# Patient Record
Sex: Male | Born: 1945
Health system: Southern US, Community
[De-identification: ages and names within clinical notes are randomized; demographics above are authoritative.]

## PROBLEM LIST (undated history)

## (undated) DIAGNOSIS — I499 Cardiac arrhythmia, unspecified: Secondary | ICD-10-CM

## (undated) DIAGNOSIS — J310 Chronic rhinitis: Secondary | ICD-10-CM

## (undated) DIAGNOSIS — R7303 Prediabetes: Secondary | ICD-10-CM

## (undated) DIAGNOSIS — E785 Hyperlipidemia, unspecified: Secondary | ICD-10-CM

## (undated) DIAGNOSIS — N400 Enlarged prostate without lower urinary tract symptoms: Secondary | ICD-10-CM

## (undated) DIAGNOSIS — I509 Heart failure, unspecified: Secondary | ICD-10-CM

## (undated) DIAGNOSIS — E669 Obesity, unspecified: Secondary | ICD-10-CM

## (undated) DIAGNOSIS — Q2543 Congenital aneurysm of aorta: Secondary | ICD-10-CM

## (undated) DIAGNOSIS — I4891 Unspecified atrial fibrillation: Secondary | ICD-10-CM

## (undated) DIAGNOSIS — I2699 Other pulmonary embolism without acute cor pulmonale: Secondary | ICD-10-CM

## (undated) DIAGNOSIS — I351 Nonrheumatic aortic (valve) insufficiency: Secondary | ICD-10-CM

## (undated) DIAGNOSIS — R011 Cardiac murmur, unspecified: Secondary | ICD-10-CM

## (undated) DIAGNOSIS — I719 Aortic aneurysm of unspecified site, without rupture: Secondary | ICD-10-CM

## (undated) DIAGNOSIS — I7121 Aneurysm of the ascending aorta, without rupture: Secondary | ICD-10-CM

## (undated) DIAGNOSIS — G4730 Sleep apnea, unspecified: Secondary | ICD-10-CM

## (undated) DIAGNOSIS — I10 Essential (primary) hypertension: Secondary | ICD-10-CM

## (undated) HISTORY — DX: Congenital aneurysm of aorta: Q25.43

## (undated) HISTORY — DX: Nonrheumatic aortic (valve) insufficiency: I35.1

## (undated) HISTORY — DX: Aneurysm of the ascending aorta, without rupture: I71.21

## (undated) HISTORY — DX: Obesity, unspecified: E66.9

## (undated) HISTORY — PX: APPENDECTOMY: SHX54

## (undated) HISTORY — DX: Essential (primary) hypertension: I10

## (undated) HISTORY — DX: Other pulmonary embolism without acute cor pulmonale: I26.99

## (undated) HISTORY — DX: Sleep apnea, unspecified: G47.30

## (undated) HISTORY — DX: Aortic aneurysm of unspecified site, without rupture: I71.9

## (undated) HISTORY — DX: Prediabetes: R73.03

## (undated) HISTORY — DX: Hyperlipidemia, unspecified: E78.5

## (undated) HISTORY — DX: Benign prostatic hyperplasia without lower urinary tract symptoms: N40.0

## (undated) HISTORY — DX: Unspecified atrial fibrillation: I48.91

## (undated) HISTORY — PX: HERNIA REPAIR: SHX51

## (undated) HISTORY — DX: Chronic rhinitis: J31.0

## (undated) HISTORY — PX: TONSILLECTOMY: SUR1361

---

## 2012-07-22 ENCOUNTER — Encounter: Payer: Self-pay | Admitting: Cardiology

## 2012-08-17 ENCOUNTER — Other Ambulatory Visit: Payer: Self-pay | Admitting: *Deleted

## 2012-08-18 ENCOUNTER — Encounter: Payer: Self-pay | Admitting: Cardiothoracic Surgery

## 2012-09-01 ENCOUNTER — Institutional Professional Consult (permissible substitution): Payer: Self-pay | Admitting: Emergency Medicine

## 2012-09-23 ENCOUNTER — Ambulatory Visit (INDEPENDENT_AMBULATORY_CARE_PROVIDER_SITE_OTHER): Payer: 59 | Admitting: Emergency Medicine

## 2012-09-23 ENCOUNTER — Encounter: Payer: Self-pay | Admitting: Emergency Medicine

## 2012-09-23 VITALS — BP 132/84 | HR 102 | Temp 98.4°F | Ht 69.5 in | Wt 224.0 lb

## 2012-09-23 DIAGNOSIS — G4733 Obstructive sleep apnea (adult) (pediatric): Secondary | ICD-10-CM

## 2012-09-23 DIAGNOSIS — Z86711 Personal history of pulmonary embolism: Secondary | ICD-10-CM

## 2012-09-23 DIAGNOSIS — I4891 Unspecified atrial fibrillation: Secondary | ICD-10-CM

## 2012-09-23 DIAGNOSIS — I719 Aortic aneurysm of unspecified site, without rupture: Secondary | ICD-10-CM

## 2012-09-23 DIAGNOSIS — Q2543 Congenital aneurysm of aorta: Secondary | ICD-10-CM

## 2012-09-23 NOTE — Progress Notes (Signed)
Subjective:    Patient ID: Aaron Richmond, male    DOB: 01-May-1945, 67 y.o.   MRN: [id number]  HPI 67 yo man, hx OSA on CPAP for 6 years, also w hx A Fib, HTN. Reports that he had PE twice, both time after plane rides. Treated with anticoag but cannot recall how long or whether hypercoag state was ever evaluated. He needs CPAP retitration, has never done this. He does get equipment from [facility] (owns his machine)   Review of Systems  Constitutional: Negative for fever and unexpected weight change.  HENT: Negative for ear pain, nosebleeds, congestion, sore throat, rhinorrhea, sneezing, trouble swallowing, dental problem, postnasal drip and sinus pressure.   Eyes: Negative for redness and itching.  Respiratory: Negative for cough, chest tightness, shortness of breath and wheezing.   Cardiovascular: Negative for palpitations and leg swelling.  Gastrointestinal: Negative for nausea and vomiting.  Genitourinary: Negative for dysuria.  Musculoskeletal: Negative for joint swelling.  Skin: Negative for rash.  Neurological: Negative for headaches.  Hematological: Does not bruise/bleed easily.  Psychiatric/Behavioral: Negative for dysphoric mood. The patient is not nervous/anxious.     Past Medical History  Diagnosis Date  . Hypertension   . Sleep apnea     CPAP  . BPH (benign prostatic hyperplasia)   . Pulmonary embolism   . Atrial fibrillation   . Hyperlipidemia   . Obesity   . Rhinitis   . Aortic regurgitation   . Aortic root aneurysm      Family History  Problem Relation Age of Onset  . Stroke Mother   . Stroke Father   . Alzheimer's disease Mother   . Heart attack Mother   . Diabetes Mellitus II Father      History   Social History  . Marital Status: Married    Spouse Name: N/A    Number of Children: 0  . Years of Education: N/A   Occupational History  . Chief Operating Officer    Social History Main Topics  . Smoking status: Never Smoker   . Smokeless  tobacco: Never Used  . Alcohol Use: No  . Drug Use: No  . Sexual Activity: Not on file   Other Topics Concern  . Not on file   Social History Narrative  . No narrative on file     Allergies  Allergen Reactions  . Statins Other (See Comments)    MYALGIAS     No outpatient prescriptions prior to visit.   No facility-administered medications prior to visit.         Objective:   Physical Exam Filed Vitals:   09/23/12 1527  BP: 132/84  Pulse: 102  Temp: 98.4 F (36.9 C)  TempSrc: Oral  Height: 5' 9.5" (1.765 m)  Weight: 224 lb (101.606 kg)  SpO2: 93%   Gen: Pleasant, well-nourished, in no distress,  normal affect  ENT: No lesions,  mouth clear,  oropharynx clear, no postnasal drip  Neck: No JVD, no TMG, no carotid bruits  Lungs: No use of accessory muscles, no dullness to percussion, clear without rales or rhonchi  Cardiovascular: RRR, heart sounds normal, no murmur or gallops, no peripheral edema  Abdomen: soft and NT, no HSM,  BS normal  Musculoskeletal: No deformities, no cyanosis or clubbing  Neuro: alert, non focal  Skin: Warm, no lesions or rashes      Assessment & Plan:  Hx pulmonary embolism Hx PE x 2 per notes, ? Whether he was treated adequately  -  check hypercoag panel prior to initiation of any anticoag for his A fib (or recurrent PE)  Obstructive sleep apnea Has [facility], owns his machine.  - needs auto-titration study by [facility] or local company, data to RB to adjust his home device.   Aortic root aneurysm Planning to see Dr Tyrone Sage for evaluation of 5cm aneurysm.  - will check PFT in prep for possible procedure / SGY

## 2012-09-23 NOTE — Patient Instructions (Addendum)
We will titrate your CPAP at home Follow with Dr Delton Coombes in 6 weeks to review

## 2012-09-24 ENCOUNTER — Other Ambulatory Visit: Payer: Self-pay | Admitting: *Deleted

## 2012-09-24 ENCOUNTER — Telehealth: Payer: Self-pay | Admitting: Emergency Medicine

## 2012-09-24 ENCOUNTER — Institutional Professional Consult (permissible substitution) (INDEPENDENT_AMBULATORY_CARE_PROVIDER_SITE_OTHER): Payer: 59 | Admitting: Cardiothoracic Surgery

## 2012-09-24 VITALS — BP 133/88 | HR 96 | Resp 16 | Ht 70.0 in | Wt 229.0 lb

## 2012-09-24 DIAGNOSIS — I719 Aortic aneurysm of unspecified site, without rupture: Secondary | ICD-10-CM | POA: Insufficient documentation

## 2012-09-24 DIAGNOSIS — I4891 Unspecified atrial fibrillation: Secondary | ICD-10-CM

## 2012-09-24 DIAGNOSIS — Z86711 Personal history of pulmonary embolism: Secondary | ICD-10-CM

## 2012-09-24 DIAGNOSIS — G4733 Obstructive sleep apnea (adult) (pediatric): Secondary | ICD-10-CM

## 2012-09-24 DIAGNOSIS — Q2543 Congenital aneurysm of aorta: Secondary | ICD-10-CM | POA: Insufficient documentation

## 2012-09-24 DIAGNOSIS — I712 Thoracic aortic aneurysm, without rupture: Secondary | ICD-10-CM

## 2012-09-24 DIAGNOSIS — I351 Nonrheumatic aortic (valve) insufficiency: Secondary | ICD-10-CM | POA: Insufficient documentation

## 2012-09-24 DIAGNOSIS — IMO0001 Reserved for inherently not codable concepts without codable children: Secondary | ICD-10-CM

## 2012-09-24 HISTORY — DX: Obstructive sleep apnea (adult) (pediatric): G47.33

## 2012-09-24 HISTORY — DX: Personal history of pulmonary embolism: Z86.711

## 2012-09-24 NOTE — Telephone Encounter (Signed)
Per Dr. Delton Coombes- Please inform patient that he has spoken with Dr. Tyrone Sage and discussed patients case. Will order labs to be done here at our office, PFTs, and CPAP auto titration study.  Orders already Placed  ATC patient no answer [id number] Patient needs to be aware of blood work needed, PFTs need to be scheduled

## 2012-09-24 NOTE — Assessment & Plan Note (Addendum)
Planning to see Dr Tyrone Sage for evaluation of 5cm aneurysm.  - will check PFT in prep for possible procedure / SGY

## 2012-09-24 NOTE — Assessment & Plan Note (Signed)
Has [facility], owns his machine.  - needs auto-titration study by [facility] or local company, data to RB to adjust his home device.

## 2012-09-24 NOTE — Patient Instructions (Signed)
Thoracic Aortic Aneurysm An aneurysm is the enlargement (dilatation), bulging or ballooning out of part of the wall of a vein or artery. An Aortic Aneurysm is a bulging in the largest artery of the body. This artery supplies blood from the heart to the rest of the body. The first part of the aorta is called the thoracic aorta. It leaves the heart, ascends (rises), arches, and descends (goes down) through the chest until it reaches the diaphragm (the muscular partition between the chest and abdomen (belly). The second part of the aorta is then called the abdominal aorta after it has passed the diaphragm and continues down through the abdomen. The abdominal aorta ends where it splits to form the two iliac arteries that go to the legs. Aortic aneurysms can develop anywhere along the length of the aorta. A thoracic aortic aneurysm (TAA) occurs in the first part of the aorta, between the heart and the diaphragm. The major importance of an aneurysm is that it can rupture or tear (dissect), causing death unless diagnosed and treated promptly. CAUSES  Most thoracic aortic aneurysms are related to arteriosclerosis. The arteriosclerosis can weaken the aortic wall. The pressure of the blood being pumped through the aorta causes it to balloon out at the site of weakness. Therefore, elevated blood pressure (hypertension) is associated with aneurysm. Other risk factors include:  Age over 60.  Tobacco use.  Male sex.  Family history of aneurysm. Additional causes of thoracic aortic aneurysms include:  Genetics (passed by birth).  Injury: After physical trauma to the aorta.  Inflammation of blood vessels.  Hardening of the arteries.  Infection. SYMPTOMS  Many aneurysms do not cause problems. A small, unchanging or slowly changing aneurysm may produce no symptoms until it suddenly ruptures or dissects (separation of the layers of the aortic wall) without warning. It may then cause death. The symptoms  (problems) of a developing aneurysm will partly depend on its size and rate of growth. Thoracic aortic aneurysms may cause pain in the:  Chest.  Back.  Sides.  Abdomen. The pain most often has a deep quality as if it is boring into the person. It may cause:  Heart failure.  Heart attack.  Hoarseness.  Cough.  Shortness of breath.  Swallowing problems. DIAGNOSIS  A thoracic aortic aneurysm may be suspected based on your symptoms. It may also be detected by x-ray or CT studies done for unrelated reasons.  Several different imaging studies can be used to confirm a TAA:  An echocardiogram is an ultrasound test to examine the heart. It can also examine the first parts of the aorta. Sometimes, this test is done by putting you to sleep and inserting a flexible telescope through your mouth into your esophagus, which is next to the aorta; excellent pictures of the aorta can be obtained. This is called a transesophageal echocardiogram (TEE).  CT scanning of the chest is accurate at showing the exact size and shape of the aneurysm.  MRI scanning is accurate, and is used for certain types of TAA.  An aortic angiogram shows the source of the major blood vessels arising from the aorta. It reveals the size and extent of any aneurysm. It can also show a clot clinging to the wall of the aneurysm (mural thrombus). The angiogram may give information about a tear of the aorta. TREATMENT  Treatment for a thoracic aortic aneurysm depends on:  Location.  Size.  Other factors.  Rate of growth.  Underlying cause. Medical treatment is used   for smaller or complicated aneurysms, or those that do not cause symptoms. These include:  Stopping smoking.  Blood pressure control.  Control of cholesterol. Surgical treatment is used for aneurysms that cause symptoms, or for those that are large or growing in size. The surgical technique depends on the location of the aneurysm. HOME CARE INSTRUCTIONS     If you smoke, stop. If you do not, do not start.  Take all medications as prescribed.  Your caregiver will tell you when to have your aneurysm rechecked, either by echocardiogram or CT scan. Be sure to keep this and all follow-up appointments. SEEK MEDICAL CARE IF:   You develop mild pain in your chest, upper back, sides, or abdomen.  You develop cough, hoarseness or trouble swallowing. SEEK IMMEDIATE MEDICAL CARE IF:   You develop severe chest or abdominal pain, or severe pain moving (radiating) to your back.  You suddenly develop cold or blue toes or feet.  You suddenly develop lightheadedness or fainting spells.  You develop trouble breathing. Document Released: 12/24/2004 Document Revised: 03/18/2011 Document Reviewed: 11/12/2006 [facility] Patient Information 2014 [place], [place]. Aortic Insufficiency  Aortic insufficiency (AI) is a condition where the valve between the heart and the aorta (the big vessel that pumps blood to the entire body) does not close well enough. This means the heart has to work harder to pump the same amount of blood than it would if the valve closed tightly. Every time the heart beats, some of the blood leaks back into the heart. This would be like bailing out a boat with a leaky bucket. Over time, this condition leads to high blood pressure and eventually causes the heart to fail. CAUSES  Anything that weakens the aortic valve can cause AI. Examples include:  Rheumatic fever.  Congenital (present at birth) valve abnormalities.  Aortic aneurysm (a ballooning of a weak spot in the vessel wall).  Syphilis.  Some collagen diseases and genetic problems. AI also can be caused by infection or injury or can develop following the repair of aortic stenosis (a narrowing of the valve that does not let enough blood through). SYMPTOMS  Weakness and fatigue.  Shortness of breath.  Needing to sleep on 2 or more pillows at night to breathe better.  Chest  discomfort.  Head bobbing. DIAGNOSIS  The diagnosis of AI can usually be made with a physical exam and an echocardiogram. An echocardiogram is a test that uses ultrasound to examine the heart. Other tests may also be done to confirm the diagnosis. TREATMENT   If there are mild to no symptoms, only observation may be needed. With severe problems, hospitalization may be necessary.  Medications may be used to prevent an infection forming on the valves, to keep symptoms from getting worse or to delay surgery for 2 or 3 years. Some medications commonly used for AI help the heart work better.  Surgery to repair or replace the valve is usually reserved for last. Surgery results in better outcomes if not delayed too long.  Sudden onset of AI may require urgent surgery to replace the valve. HOME CARE INSTRUCTIONS   Echocardiograms (a sound wave picture of the heart and great vessels) are done periodically to monitor treatment.  Notify the health care provider or dentist about any history of heart valve disease before treatment for any condition. Any dental work, including cleaning, and any invasive procedure can introduce bacteria into the bloodstream. Bacteria can infect a weakened valve causing endocarditis.  Take any medications as  prescribed. SEEK MEDICAL CARE IF:  Chest or breathing problems that get worse occur.  You notice irregular heartbeats.  You have unexplained fevers. SEEK IMMEDIATE MEDICAL CARE IF:   You have new or severe shortness of breath.  You experience lightheadedness.  New or severe chest pains occur.  There are rapid or irregular heartbeats related to the above symptoms. MAKE SURE YOU:   Understand these instructions.  Will watch your condition.  Will get help right away if you are not doing well or get worse. Document Released: 06/30/2002 Document Revised: 03/18/2011 Document Reviewed: 11/12/2006 [facility] Patient Information 2014 [place], [place]. Aortic  Dissection Aortic dissection happens when there is a tear in the inner wall of the aorta. The aorta is the largest blood vessel in the body. It carries blood from the heart to the arteries, and those arteries send blood to all parts of the body. When there is a tear, the blood enters inside the aortic wall and creates a new space for the blood. This causes the aorta to split even more. The collection of blood into this new space may lower the amount of blood that reaches the rest of the body. A dissection causes the aortic wall to be weakened and can cause rupture. Death can occur. It is critical to have medical care right away. CAUSES  Aortic dissection is caused by two things:  A small tear that develops in a weak or injured part of the aortic wall.  The tear spreads inside the aortic wall and the aorta becomes "double-barreled." The following increase the risk of aortic dissection:  High blood pressure.  Hardening of the walls of arteries.  Use of cocaine.  Blunt injury to the chest.  Genetic disorders that affect the connective tissue (one example is Marfan syndrome).  Problems (such as infection) that affect either the aorta or the heart valve.  Problems that affect the tissue that holds together different parts of your body (connective tissue). For example, a few uncommon arthritis problems can increase the chance of an aortic dissection. SYMPTOMS   Sudden onset of severe chest pain. The pain may be sharp, stabbing, or ripping in nature.  The pain may then shift to the shoulder, arm, neck, jaw, abdomen, or hips. Other symptoms may include:  Breathing trouble.  Weakness of any part of the body.  Decreased feeling of any part of the body.  Fainting.  Dizziness.  Feeling sick to your stomach (nausea) and vomiting.  Sweating a lot.  Confusion/dazed. DIAGNOSIS  Testing may include the following:  Electrocardiogram.  Chest x-ray.  Imaging study done after injecting  a dye to clearly view the blood vessel (aortic angiography).  Specialized x-ray of the chest is done after injecting a dye (CT scan) or an MRI.  A test where the image of the heart is studied using sound waves (Echocardiogram). TREATMENT  Treatment will vary depending on what part of the aorta has the problem. Your caregiver may admit you into an intensive care unit. Medicines that reduce the blood pressure and strong painkillers may be given right away. In many cases, heart medicines may be given to relieve the symptoms and help blood pressure.  If medicines are used first and they do not help, then surgery may be done to repair or replace the damaged portion of the aorta. In some cases, surgery is needed right away. You may have to stay in the hospital for 7-10 days. If the aortic valve is damaged due to aortic dissection,  repair or replacement of the valve may be done. If the arteries of the heart are affected, bypass surgery of the heart may be done. THIS IS AN EMERGENCY. Call your local emergency services (911 in [place] right away or the closest medical facility.  Document Released: 04/02/2007 Document Revised: 03/18/2011 Document Reviewed: 04/02/2007 [facility] Patient Information 2014 [place], [place].

## 2012-09-24 NOTE — Assessment & Plan Note (Signed)
Hx PE x 2 per notes, ? Whether he was treated adequately  - check hypercoag panel prior to initiation of any anticoag for his A fib (or recurrent PE)

## 2012-09-24 NOTE — Progress Notes (Signed)
[address]       [place] [address]             [phone]                    Aaron Richmond [facility] Medical Record #[id number] Date of Birth: May 07, 1945  Referring: Crist Fat, MD Primary Care: Dr Leonia Reader  Chief Complaint:    Chief Complaint  Patient presents with  . TAA    eval and treat.Marland KitchenMarland KitchenCTA  05/11/12 @ [facility]...ECHO 05/07/12 @ [facility]    History of Present Illness:    Patient is a 67 year old male referred because of dilated ascending aorta noted on CT scan done at [facility] may fifth 2014. The patient notes that in May he was referred to [facility] for cardiology clearance prior to EAR surgery. He notes that Dr. Wille Glaser was doing a stress echo on him but never completed it because  a dilated aortic root , moderate aortic regurgitation and  mild to moderate mitral regurgitation was noted . A CT scan of the chest was performed following the echocardiogram 05/11/2012. He notes that he know his aorta was large for "years". The patient is now with referred to the cardiac surgery office because of these findings.  The patient also he has been in atrial fibrillation for several years and has a history of at least 2 pulmonary emboli. He was not clear on his anticoagulation history, but currently is on no anticoagulation other than aspirin.  He denies anginal or exertional chest, is not limited by shortness of breath. He does have known sleep apnea and uses BiPAP at night. He has had no previous history of myocardial infarction, but does have a brother who had a myocardial infarction at age 74. There is no family history of Marfan's or acute aortic dissections.      Current Activity/ Functional Status:  Patient is independent with mobility/ambulation, transfers, ADL's, IADL's.  Zubrod Score: At the time of surgery this patient's most appropriate activity status/level should be described as: []  Normal activity, no  symptoms [x]  Symptoms, fully ambulatory []  Symptoms, in bed less than or equal to 50% of the time []  Symptoms, in bed greater than 50% of the time but less than 100% []  Bedridden []  Moribund   Past Medical History  Diagnosis Date  . Hypertension   . Sleep apnea     CPAP  . BPH (benign prostatic hyperplasia)   . Pulmonary embolism   . Atrial fibrillation   . Hyperlipidemia   . Obesity   . Rhinitis   . Aortic regurgitation   . Aortic root aneurysm     Past Surgical History  Procedure Laterality Date  . Appendectomy    . Tonsillectomy    . Hernia repair      Family History  Problem Relation Age of Onset  . Stroke Mother   . Stroke Father   . Alzheimer's disease Mother   . Heart attack Mother   . Diabetes Mellitus II Father    No family history of Marfan's or aortic dissection.   History  Smoking status  . Never Smoker   Smokeless tobacco  . Never Used    History  Alcohol Use No     Allergies  Allergen Reactions  . Statins Other (See Comments)    MYALGIAS    Current Outpatient Prescriptions  Medication Sig Dispense Refill  . aspirin 81 MG tablet  Take 81 mg by mouth daily. 2 tablets daily      . b complex vitamins tablet Take 1 tablet by mouth daily.      . Chelated Zinc 50 MG TABS Take 1 tablet by mouth daily.      . finasteride (PROSCAR) 5 MG tablet Take 5 mg by mouth daily.      . fish oil-omega-3 fatty acids 1000 MG capsule Take 1,200 g by mouth daily.      . folic acid (FOLVITE) 800 MCG tablet Take 800 mcg by mouth daily.      . hydrochlorothiazide (HYDRODIURIL) 25 MG tablet Take 25 mg by mouth daily.      . Inositol Niacinate (NO FLUSH NIACIN) 100 MG TABS Take 1 tablet by mouth 2 (two) times daily.      . meloxicam (MOBIC) 7.5 MG tablet Take 7.5 mg by mouth daily.      . metoprolol tartrate (LOPRESSOR) 25 MG tablet Take 25 mg by mouth daily.      . Multiple Vitamins-Minerals (MENS MULTIVITAMIN PLUS PO) Take 1 tablet by mouth daily.      .  Nettle, Urtica Dioica, (NETTLE LEAF PO) Take 10 mg by mouth daily.      . Potassium 99 MG TABS Take 1 tablet by mouth daily.      . ranitidine (ZANTAC) 75 MG tablet Take 75 mg by mouth daily.      . Red Yeast Rice Extract (RED YEAST RICE PO) Take 800 mg by mouth 2 (two) times daily.      . tamsulosin (FLOMAX) 0.4 MG CAPS capsule Take 0.4 mg by mouth daily after supper.      Marland Kitchen UNABLE TO FIND Take 1 tablet by mouth daily. Med Name:Curamin       No current facility-administered medications for this visit.       Review of Systems:     Cardiac Review of Systems: Y or N  Chest Pain [  n  ]  Resting SOB [  n ] Exertional SOB  [ y ]  Pollyann Kennedy Milo.Brash  ]   Pedal Edema Milo.Brash   ]    Palpitations Cove.Etienne  ] Syncope  Milo.Brash  ]   Presyncope Milo.Brash   ]  General Review of Systems: [Y] = yes [  ]=no Constitional: recent weight change [n  ]; anorexia [  ]; fatigue [ y ]; nausea [  ]; night sweats [n  ]; fever [  n]; or chills Milo.Brash  ];                                                                                                                                          Dental: poor dentition[ n ]; Last Dentist visit: reguler dental visits q 6 months  Eye : blurred vision [  ]; diplopia [   ]; vision changes [  ];  Amaurosis fugax[  ]; Resp: cough [ n ];  wheezing[ n ];  hemoptysis[n  ]; shortness of breath[  ]; paroxysmal nocturnal dyspnea[  ]; dyspnea on exertion[  ]; or orthopnea[  ];  GI:  gallstones[n  ], vomiting[  ];  dysphagia[  ]; melena[n  ];  hematochezia [ n ]; heartburn[ n ];   Hx of  Colonoscopy[ y 8 years ago "clean" ]; GU: kidney stones [n  ]; hematuria[n  ];   dysuria Cove.Etienne  ];  nocturia[  ];  history of     obstruction [  y]; urinary frequency Cove.Etienne  ]             Skin: rash, swelling[  ];, hair loss[  ];  peripheral edema[  ];  or itching[  ]; Musculosketetal: myalgias[  ];  joint swelling[  ];  joint erythema[  ];  joint pain[  ];  back pain[  ];  Heme/Lymph: bruising[  ];  bleeding[  ];  anemia[  ];  Neuro: TIA[n   ];  headaches[n  ];  stroke[n  ];  vertigo[  ];  seizures[ n ];   paresthesias[ n ];  difficulty walking[ n ];  Psych:depression[  ]; anxiety[  ];  Endocrine: diabetes[  ];  thyroid dysfunction[  ];  Immunizations: Flu [ refused  ]; Pneumococcal[refused  ];  Other:  Physical Exam: BP 133/88  Pulse 96  Resp 16  Ht 5\' 10"  (1.778 m)  Wt 229 lb (103.874 kg)  BMI 32.86 kg/m2  SpO2 96%  Physical Examination: General appearance - alert, well appearing, and in no distress, oriented to person, place, and time and overweight Mental status - alert, oriented to person, place, and time, normal mood, behavior, speech, dress, motor activity, and thought processes Eyes - pupils equal and reactive, extraocular eye movements intact Ears - bilateral TM's and external ear canals normal, not examined Nose - normal and patent, no erythema, discharge or polyps Mouth - mucous membranes moist, pharynx normal without lesions Neck - supple, no significant adenopathy, carotids upstroke normal bilaterally, no bruits, thyroid exam: thyroid is normal in size without nodules or tenderness Chest - clear to auscultation, no wheezes, rales or rhonchi, symmetric air entry, no tachypnea, retractions or cyanosis Heart - normal rate, regular rhythm, normal S1, S2, no murmurs, rubs, clicks or gallops, diastolic murmur 2/6 at lower left sternal border, no JVD Abdomen - soft, nontender, nondistended, no masses or organomegaly no bladder distension noted no abdominal bruits no pulsatile masses no CVA tenderness no hernias noted Neurological - alert, oriented, normal speech, no focal findings or movement disorder noted, screening mental status exam normal, cranial nerves II through XII intact Musculoskeletal - no joint tenderness, deformity or swelling, no muscular tenderness noted, full range of motion without pain Extremities - peripheral pulses normal, no pedal edema, no clubbing or cyanosis, intact peripheral pulses, feet  normal, good pulses, normal color, temperature and sensation, monofilament sensory exam is normal in both feet Skin - normal coloration and turgor, no rashes, no suspicious skin lesions noted On physical exam no stigmata of Marfan's  Diagnostic Studies & Laboratory data:     Recent Radiology Findings:  None     Recent Lab Findings: No results found for this basename: WBC, HGB, HCT, PLT, GLUCOSE, CHOL, TRIG, HDL, LDLDIRECT, LDLCALC, ALT, AST, NA, K, CL, CREATININE, BUN, CO2, TSH, INR, GLUF, HGBA1C    05/11/2012 CT of Chest: CT ANGIOGRAPHY CHEST Comparison: CT scan of June 20, 2008.  Findings: No significant abnormality seen involving the pulmonary parenchyma. No pleural effusions or pneumothorax are noted. Normal contrast filling of pulmonary arteries is noted. There is no evidence of aortic dissection. Coronary artery calcifications are noted consistent with coronary artery disease. There is mild dilatation of the ascending thoracic aorta is again noted which now measures 5.0 cm which is slightly increased compared to prior exam of 2010. No mass or adenopathy is noted in the mediastinum. Visualized portions of upper abdomen are within normal limits.  IMPRESSION: No evidence of thoracic aortic dissection is noted. Coronary artery calcifications are noted suggesting coronary artery disease. Aneurysmal dilatation of the ascending thoracic aorta is noted with maximum measured diameter of 5.0 cm which has increased compared to prior exam of 2010. Original Report Authenticated By: Lupita Raider., M.D.    02/2006 CT of Chest Satisfactory contrast-opacification of pulmonary vasculature. Right upper lobe evidence for pulmonary thromboembolism. Ascending aorta measures 4.4 cm transverse by 4.7 cm AP. Descending aorta measures 3.2 cm transverse by 3.1 cm AP. No acute pulmonary infiltrate. No pleural effusion. No adenopathy. Normal cardiac size. 11/02/2007 CT of Chest: Findings: The  chest wall is stable. No supraclavicular or axillary adenopathy. The thyroid gland appears normal.  The heart is borderline enlarged but stable. No pericardial effusion. Stable small scattered mediastinal and hilar lymph nodes.  No change and fusiform aneurysmal dilatation of the ascending aorta with maximal measurements of 4.7 x 4.7 cm. No dissection. The major branch vessels are patent. The esophagus is grossly normal. There is a filling defect and a right lower lobe pulmonary artery consistent with pulmonary embolism. There is tortuosity of the descending thoracic aorta.  Examination of the lung parenchyma demonstrates minimal lingular scarring change. The airspace consolidation has resolved and this may be post pneumonic scarring. No infiltrates, edema or effusions. No interstitial lung disease. No pulmonary masses or nodules.  The upper abdomen is stable. There is a small left adrenal gland nodule which is likely a benign adenoma.  IMPRESSION:  1. Incidental pulmonary embolism is noted in a right lower lobe pulmonary artery. 2. Near complete resolution of left lingular process. There is minimal residual probable post pneumonic scarring changes. No acute pulmonary findings or pulmonary masses. 3. Stable ascending thoracic aortic aneurysm. 4. Borderline cardiac size, stable. 5. Stable small mediastinal and hilar lymph nodes. 6. Stable left adrenal gland nodule, likely benign adenoma. 7. Stable periportal and celiac axis lymph nodes.  06/20/2008 CT of Chest: Comparison: CT chest without and with contrast 11/02/2007. Findings: Measured in the axial plane at a level approximately 2.5 cm above the aortic valve on series 4, image 30, maximum diameter of the ascending thoracic aorta remains unchanged at 4.7 cm. Tortuous aortic arch and proximal great vessels without significant atherosclerosis. Mild descending thoracic and upper abdominal aortic atherosclerosis. No evidence of  aortic dissection. Heart mildly enlarged but stable. No visible coronary artery calcification. No pericardial effusion. Interval resolution of the segmental right lower lobe of pulmonary embolus; no central pulmonary emboli currently.  Stable scattered normal-sized mediastinal and hilar lymph nodes; no new, enlarging, or suspicious lymphadenopathy. Normal thyroid gland. Minimal scarring in the lingula and in the lower lobes. Lungs otherwise clear without localized airspace consolidation, interstitial disease, or nodularity. High resolution expiratory images demonstrate scattered areas of hyperlucency consistent with localized air trapping. Stable approximate 1.1 cm left adrenal adenoma. Normal-sized porta hepatis and celiac axis lymph nodes again noted and unchanged. Remaining visualized upper abdomen unremarkable for the early arterial phase of  enhancement. Bone window images again demonstrate mild thoracic spondylosis.  IMPRESSION: Since the prior CT chest 11/02/2007: 1. Stable ascending thoracic aortic aneurysm with maximum diameter approximately 4.7 cm. 2. Resolution of the right lower lobe segmental pulmonary embolus. No central pulmonary emboli currently. 3. Stable mild cardiomegaly. No acute cardiopulmonary disease. 4. Scattered areas of hyperlucency on the expiratory high resolution images consistent with localized areas of air trapping as can be seen in asthma and/or bronchitis. 5. Stable 1.1 cm left adrenal adenoma. IMPRESSION: Right upper lobe filling defect consistent with nonocclusive pulmonary thromboembolism. Ascending aortic enlargement measuring 4.4 cm transverse by 4.7 cm AP. No significant consolidation or effusion identified.     Assessment / Plan:     1)  Dilated ascending aorta, chronic 4.7 cm in 2008 now 5 cm in 2014 (6 months ago),                    Aortic Size Index=    5     /Body surface area is 2.27 meters squared. = 2.20                                                      < 2.75 cm/m2      4% risk per year                                                       2.75 to 4.25          8% risk per year                                                      > 4.25 cm/m2    20% risk per year                                                       cross sectional area of aorta cm2/height in meters >                                                       19.63/1.778 = 11.04  #2 valvular heart disease by echo report: Moderate aortic insufficiency, mild to moderate mitral insufficiency #3 chronic atrial fibrillation on aspirin (assuming nonvalvular afib chad2 =1) #4 extensive coronary artery calcification on CT scan, positive family history of early coronary artery disease, no previous history of ischemic events #5 history of pulmonary emboli x2, no history of workup for hypercoagulable state #6 obstructive sleep apnea #7 hyperlipidemia  #8 benign prostatic hypertrophy #9 history of hypertension, he is currently on a beta blocker  I have reviewed with the patient the finding of dilated aorta and mild to moderate aortic  insufficiency on the available studies. The patient will need close follow up to determine the appropriate time for ascending aorta/root and probable aortic valve replacement in the future. Typically 5.5 cm is the size to consider elective ascending aortic replacement.  The patient has other medical issues that will influence decisions about proceeding with surgery, I discussed with Dr. Delton Coombes who saw him for CPAP management evaluating him for hypercoagulable state, this testing has been ordered.  He has been referred to cardiology for repeat echo and to follow for potential coronary artery disease and chronic atrial fibrillation and potential need for anticoagulation. Obviously before any planned cardiac surgery he will need cardiac catheterization.  I'll plan to see him back in 3-4 weeks   Delight Ovens MD      [address] [place] [place] [address] Office [phone]   Beeper [phone]  09/24/2012 1:45 PM

## 2012-09-28 NOTE — Telephone Encounter (Signed)
Spoke with patient,  Patient is aware labs are already ordered Patient had PFT scheduled for 9/29 @ 9am at [facility] however these were cancelled? Patient would like to keep that appt, so I have called and had him rescheduled for same time/date Patient is also aware that he will have CPAP titration done  Verbalized understanding of above and nothing further needed at this time

## 2012-10-05 ENCOUNTER — Encounter (HOSPITAL_COMMUNITY): Payer: 59

## 2012-10-06 ENCOUNTER — Other Ambulatory Visit (HOSPITAL_COMMUNITY): Payer: 59

## 2012-10-08 ENCOUNTER — Ambulatory Visit (HOSPITAL_COMMUNITY)
Admission: RE | Admit: 2012-10-08 | Discharge: 2012-10-08 | Disposition: A | Discharge status: Home / Self Care | Payer: 59 | Source: Ambulatory Visit | Attending: Emergency Medicine | Admitting: Emergency Medicine

## 2012-10-08 DIAGNOSIS — J988 Other specified respiratory disorders: Secondary | ICD-10-CM | POA: Insufficient documentation

## 2012-10-08 DIAGNOSIS — I719 Aortic aneurysm of unspecified site, without rupture: Secondary | ICD-10-CM

## 2012-10-08 DIAGNOSIS — R0609 Other forms of dyspnea: Secondary | ICD-10-CM | POA: Insufficient documentation

## 2012-10-08 DIAGNOSIS — R0989 Other specified symptoms and signs involving the circulatory and respiratory systems: Secondary | ICD-10-CM | POA: Insufficient documentation

## 2012-10-08 MED ORDER — ALBUTEROL SULFATE (5 MG/ML) 0.5% IN NEBU
2.5000 mg | INHALATION_SOLUTION | Freq: Once | RESPIRATORY_TRACT | Status: AC
  Administered 2012-10-08: 2.5 mg via RESPIRATORY_TRACT

## 2012-10-13 ENCOUNTER — Ambulatory Visit: Payer: 59

## 2012-10-13 DIAGNOSIS — I719 Aortic aneurysm of unspecified site, without rupture: Secondary | ICD-10-CM

## 2012-10-13 DIAGNOSIS — Z86711 Personal history of pulmonary embolism: Secondary | ICD-10-CM

## 2012-10-15 ENCOUNTER — Ambulatory Visit (HOSPITAL_COMMUNITY): Payer: 59 | Attending: Cardiothoracic Surgery | Admitting: Radiology

## 2012-10-15 ENCOUNTER — Other Ambulatory Visit (HOSPITAL_COMMUNITY): Payer: Self-pay | Admitting: Cardiothoracic Surgery

## 2012-10-15 DIAGNOSIS — I4891 Unspecified atrial fibrillation: Secondary | ICD-10-CM

## 2012-10-15 LAB — HYPERCOAGULABLE PANEL, COMPREHENSIVE
AntiThromb III Func: 200 % — ABNORMAL HIGH (ref 76–126)
Anticardiolipin IgG: 10 GPL U/mL (ref <23)
Beta-2 Glyco I IgG: 0 G Units (ref <20)
Beta-2-Glycoprotein I IgA: 0 A Units (ref <20)
Beta-2-Glycoprotein I IgM: 0 M Units (ref <20)
DRVVT: 34.7 secs (ref <42.9)
Lupus Anticoagulant: NOT DETECTED
PTT Lupus Anticoagulant: 31 secs (ref 28.0–43.0)
Protein C Activity: 158 % — ABNORMAL HIGH (ref 75–133)
Protein C, Total: 119 % (ref 72–160)
Protein S Total: 100 % (ref 60–150)

## 2012-10-15 NOTE — Progress Notes (Signed)
Echocardiogram performed.  

## 2012-10-20 ENCOUNTER — Encounter: Payer: Self-pay | Admitting: Cardiology

## 2012-10-20 ENCOUNTER — Ambulatory Visit (INDEPENDENT_AMBULATORY_CARE_PROVIDER_SITE_OTHER): Payer: 59 | Admitting: Cardiology

## 2012-10-20 VITALS — BP 132/93 | HR 82 | Ht 70.0 in | Wt 220.1 lb

## 2012-10-20 DIAGNOSIS — Z79899 Other long term (current) drug therapy: Secondary | ICD-10-CM

## 2012-10-20 DIAGNOSIS — I4891 Unspecified atrial fibrillation: Secondary | ICD-10-CM

## 2012-10-20 LAB — CBC WITH DIFFERENTIAL/PLATELET
Basophils Absolute: 0 10*3/uL (ref 0.0–0.1)
Basophils Relative: 0.5 % (ref 0.0–3.0)
Eosinophils Absolute: 0.9 10*3/uL — ABNORMAL HIGH (ref 0.0–0.7)
Eosinophils Relative: 10 % — ABNORMAL HIGH (ref 0.0–5.0)
HCT: 45.1 % (ref 39.0–52.0)
Hemoglobin: 15.1 g/dL (ref 13.0–17.0)
Lymphocytes Relative: 21.9 % (ref 12.0–46.0)
Lymphs Abs: 1.9 10*3/uL (ref 0.7–4.0)
MCHC: 33.5 g/dL (ref 30.0–36.0)
MCV: 92.8 fl (ref 78.0–100.0)
Monocytes Absolute: 0.9 10*3/uL (ref 0.1–1.0)
Monocytes Relative: 10 % (ref 3.0–12.0)
Neutro Abs: 5.1 10*3/uL (ref 1.4–7.7)
Neutrophils Relative %: 57.6 % (ref 43.0–77.0)
Platelets: 232 10*3/uL (ref 150.0–400.0)
RBC: 4.86 Mil/uL (ref 4.22–5.81)
RDW: 13.3 % (ref 11.5–14.6)
WBC: 8.9 10*3/uL (ref 4.5–10.5)

## 2012-10-20 LAB — COMPREHENSIVE METABOLIC PANEL
ALT: 18 U/L (ref 0–53)
AST: 24 U/L (ref 0–37)
Albumin: 4.1 g/dL (ref 3.5–5.2)
Alkaline Phosphatase: 62 U/L (ref 39–117)
BUN: 22 mg/dL (ref 6–23)
CO2: 30 mEq/L (ref 19–32)
Calcium: 9.6 mg/dL (ref 8.4–10.5)
Chloride: 100 mEq/L (ref 96–112)
Creatinine, Ser: 1.1 mg/dL (ref 0.4–1.5)
GFR: 71.13 mL/min (ref >60.00)
Glucose, Bld: 86 mg/dL (ref 70–99)
Potassium: 4.4 mEq/L (ref 3.5–5.1)
Sodium: 140 mEq/L (ref 135–145)
Total Bilirubin: 0.9 mg/dL (ref 0.3–1.2)
Total Protein: 7.5 g/dL (ref 6.0–8.3)

## 2012-10-20 NOTE — Progress Notes (Signed)
Patient ID: CODEE TUTSON, male   DOB: February 01, 1945, 67 y.o.   MRN: [id number]    Patient Name: Aaron Richmond Date of Encounter: 10/20/2012  Primary Care Provider:  Provider Not In System Primary Cardiologist:  Tobias Alexander, H  Patient Profile  Atrial fibrillation  Problem List   Past Medical History  Diagnosis Date  . Hypertension   . Sleep apnea     CPAP  . BPH (benign prostatic hyperplasia)   . Pulmonary embolism   . Atrial fibrillation   . Hyperlipidemia   . Obesity   . Rhinitis   . Aortic regurgitation   . Aortic root aneurysm    Past Surgical History  Procedure Laterality Date  . Appendectomy    . Tonsillectomy    . Hernia repair      Allergies  Allergies  Allergen Reactions  . Statins Other (See Comments)    MYALGIAS    HPI  67 year old with h/o hypertension and hyperlipidemia with known dilated ascending aorta noted on CT in 2008 measured at 4.7 cm and 5 cm in May 2014. The patient was referred to [facility] for preop visit prior to the ear surgery. He was scheduled for a stress echo that was not performed as there was a concern about dilated root and use of Dobutamine.  The patient also states that he has been in atrial fibrillation for a while (unsure for how long). He has never been on anticoagulation for it. He has history of two pulmonary emboli, both after a long plane flight. He was using Coumadin for 6 weeks each time.  The patient states that he is quite active, he is employed and he spends his weekends working in the back yard. He has never felt limited by shortness of breath. He has never had chest pain No palpitations and no syncope. He does have known sleep apnea and uses BiPAP at night.   Patient's brother had a myocardial infarction at age 40.   Home Medications  Prior to Admission medications   Medication Sig Start Date End Date Taking? Authorizing Provider  aspirin 81 MG tablet Take 81 mg by mouth daily. 2 tablets  daily   Yes Historical Provider, MD  b complex vitamins tablet Take 1 tablet by mouth daily.   Yes Historical Provider, MD  Chelated Zinc 50 MG TABS Take 1 tablet by mouth daily.   Yes Historical Provider, MD  finasteride (PROSCAR) 5 MG tablet Take 5 mg by mouth daily.   Yes Historical Provider, MD  fish oil-omega-3 fatty acids 1000 MG capsule Take 1,200 g by mouth daily.   Yes Historical Provider, MD  folic acid (FOLVITE) 800 MCG tablet Take 800 mcg by mouth daily.   Yes Historical Provider, MD  hydrochlorothiazide (HYDRODIURIL) 25 MG tablet Take 25 mg by mouth daily.   Yes Historical Provider, MD  Inositol Niacinate (NO FLUSH NIACIN) 100 MG TABS Take 1 tablet by mouth 2 (two) times daily.   Yes Historical Provider, MD  meloxicam (MOBIC) 7.5 MG tablet Take 7.5 mg by mouth daily.   Yes Historical Provider, MD  metoprolol tartrate (LOPRESSOR) 25 MG tablet Take 25 mg by mouth daily.   Yes Historical Provider, MD  Multiple Vitamins-Minerals (MENS MULTIVITAMIN PLUS PO) Take 1 tablet by mouth daily.   Yes Historical Provider, MD  Nettle, Urtica Dioica, (NETTLE LEAF PO) Take 10 mg by mouth daily.   Yes Historical Provider, MD  Potassium 99 MG TABS Take 1 tablet by  mouth daily.   Yes Historical Provider, MD  ranitidine (ZANTAC) 75 MG tablet Take 75 mg by mouth daily.   Yes Historical Provider, MD  Red Yeast Rice Extract (RED YEAST RICE PO) Take 800 mg by mouth 2 (two) times daily.   Yes Historical Provider, MD  tamsulosin (FLOMAX) 0.4 MG CAPS capsule Take 0.4 mg by mouth daily after supper.   Yes Historical Provider, MD  UNABLE TO FIND Take 2 tablets by mouth daily. Med Name:Curamin   Yes Historical Provider, MD    Family History  Family History  Problem Relation Age of Onset  . Stroke Mother   . Stroke Father   . Alzheimer's disease Mother   . Heart attack Mother   . Diabetes Mellitus II Father     Social History  History   Social History  . Marital Status: Married    Spouse Name: N/A      Number of Children: 0  . Years of Education: N/A   Occupational History  . Chief Operating Officer    Social History Main Topics  . Smoking status: Never Smoker   . Smokeless tobacco: Never Used  . Alcohol Use: No  . Drug Use: No  . Sexual Activity: Not on file   Other Topics Concern  . Not on file   Social History Narrative  . No narrative on file     Review of Systems General:  No chills, fever, night sweats or weight changes.  Cardiovascular:  No chest pain, dyspnea on exertion, edema, orthopnea, palpitations, paroxysmal nocturnal dyspnea. Dermatological: No rash, lesions/masses Respiratory: No cough, dyspnea Urologic: No hematuria, dysuria Abdominal:   No nausea, vomiting, diarrhea, bright red blood per rectum, melena, or hematemesis Neurologic:  No visual changes, wkns, changes in mental status. All other systems reviewed and are otherwise negative except as noted above.  Physical Exam  Blood pressure 132/93, pulse 82, height 5\' 10"  (1.778 m), weight 220 lb 1.3 oz (99.828 kg).  General: Pleasant, NAD Psych: Normal affect. Neuro: Alert and oriented X 3. Moves all extremities spontaneously. HEENT: Normal  Neck: Supple without bruits or JVD. Lungs:  Resp regular and unlabored, CTA. Heart: RRR no s3, s4, or murmurs. Abdomen: Soft, non-tender, non-distended, BS + x 4.  Extremities: No clubbing, cyanosis or edema. DP/PT/Radials 2+ and equal bilaterally.  Accessory Clinical Findings  ECG - A fib rate controlled 82 BPM, non-specific ST changes  TTE: 10/15/2012 Study Conclusions  - Left ventricle: The cavity size was mildly dilated. Wall thickness was increased in a pattern of mild LVH. Systolic function was normal. The estimated ejection fraction was in the range of 50% to 55%. - Aortic valve: Mild regurgitation. - Aorta: The aorta was moderately dilated. - Left atrium: The atrium was moderately dilated.    Assessment & Plan  Assessment / Plan:  1) Dilated  ascending aorta, chronic 4.7 cm in 2008 now 5 cm in May 2014. Dr Tyrone Sage evaluated the patient, wants to follow closely and operate when ascending aorta reaches 5.5 cm unless there is another indication. Currently the degree of aortic insufficiency is only mild, there also mild left ventricular dilatation (LVEDD = 57 cm).   The patient has extensive coronary artery calcification on CT scan, positive family history of early coronary artery disease. However, he is asymptomatic and unless he is undergoing the surgery in the near future there is no need for stress testing or a cath. We will evaluate prior to the surgery.  2) Chronic atrial fibrillation on  aspirin (assuming nonvalvular afib chad2 =1)  His CHA2DS2-VASc score is 2. However, the history of at least 2 prior pulmonary embolisms places him at the higher risk of thromboembolic event. We will start him on Xarelto.  3) Hyperlipidemia - statin intolerance, but controlled on niacin and diet  4. Hyertension - controlled on Metoprolol  5. Preop evaluation - the patient is currently asymptomatic for CAD or CHF. He is physically active and can achieve minimum of 8 METS. There is currently no contraindication to his ear surgery. Continue metoprolol in the perioperative period.    Tobias Alexander, Rexene Edison, MD 10/20/2012, 9:32 AM

## 2012-10-20 NOTE — Patient Instructions (Addendum)
Will obtain labs today and call you with the results (cmet/cbc)  When lab results are reviewed will start you on Xarelto daily  Follow up with Orma Flaming D in 1 month  Your physician wants you to follow-up in: 6 month ov/ech You will receive a reminder letter in the mail two months in advance. If you don't receive a letter, please call our office to schedule the follow-up appointment.

## 2012-11-05 ENCOUNTER — Ambulatory Visit: Payer: 59 | Admitting: Cardiothoracic Surgery

## 2012-11-11 ENCOUNTER — Other Ambulatory Visit: Payer: Self-pay | Admitting: *Deleted

## 2012-11-11 MED ORDER — RIVAROXABAN 20 MG PO TABS: 20.0000 mg | ORAL_TABLET | Freq: Every day | ORAL | Status: DC

## 2012-11-19 ENCOUNTER — Ambulatory Visit (INDEPENDENT_AMBULATORY_CARE_PROVIDER_SITE_OTHER): Payer: 59 | Admitting: Cardiothoracic Surgery

## 2012-11-19 ENCOUNTER — Encounter: Payer: Self-pay | Admitting: Cardiothoracic Surgery

## 2012-11-19 VITALS — BP 124/85 | HR 90 | Resp 20 | Ht 70.0 in | Wt 220.0 lb

## 2012-11-19 DIAGNOSIS — I712 Thoracic aortic aneurysm, without rupture: Secondary | ICD-10-CM

## 2012-11-19 DIAGNOSIS — I4891 Unspecified atrial fibrillation: Secondary | ICD-10-CM

## 2012-11-19 NOTE — Progress Notes (Addendum)
[facility] Medical Record #[id number] Date of Birth: 12-09-1945  Referring: Crist Fat, MD Primary Care: Dr Leonia Reader Cardiology : Dr Andreas Ohm  Chief Complaint:    Chief Complaint  Patient presents with  . Thoracic Aortic Aneurysm    4 week f/u, ECHO 10/15/12, cardiac clearance 10/20/12     History of Present Illness:    Patient is a 67 year old male referred because of dilated ascending aorta noted on CT scan done at [facility] may fifth 2014. The patient notes that in May he was referred to [facility] for cardiology clearance prior to EAR surgery. He notes that Dr. Wille Glaser was doing a stress echo on him but never completed it because  a dilated aortic root , moderate aortic regurgitation and  mild to moderate mitral regurgitation was noted . A CT scan of the chest was performed following the echocardiogram 05/11/2012. He notes that he know his aorta was large for "years". The patient is now with referred to the cardiac surgery office because of these findings.  The patient also he has been in atrial fibrillation for several years and has a history of at least 2 pulmonary emboli. He was not clear on his anticoagulation history, but currently is on no anticoagulation other than aspirin.  He denies anginal or exertional chest, is not limited by shortness of breath. He does have known sleep apnea and uses BiPAP at night. He has had no previous history of myocardial infarction, but does have a brother who had a myocardial infarction at age 8. There is no family history of Marfan's or acute aortic dissections.      Current Activity/ Functional Status:  Patient is independent with mobility/ambulation, transfers, ADL's, IADL's.  Zubrod Score: At the time of surgery this patient's most appropriate activity status/level should be described as: []  Normal activity, no symptoms [x]  Symptoms, fully ambulatory []  Symptoms, in bed  less than or equal to 50% of the time []  Symptoms, in bed greater than 50% of the time but less than 100% []  Bedridden []  Moribund   Past Medical History  Diagnosis Date  . Hypertension   . Sleep apnea     CPAP  . BPH (benign prostatic hyperplasia)   . Pulmonary embolism   . Atrial fibrillation   . Hyperlipidemia   . Obesity   . Rhinitis   . Aortic regurgitation   . Aortic root aneurysm     Past Surgical History  Procedure Laterality Date  . Appendectomy    . Tonsillectomy    . Hernia repair      Family History  Problem Relation Age of Onset  . Stroke Mother   . Stroke Father   . Alzheimer's disease Mother   . Heart attack Mother   . Diabetes Mellitus II Father    No family history of Marfan's or aortic dissection.   History  Smoking status  . Never Smoker   Smokeless tobacco  . Never Used    History  Alcohol Use No     Allergies  Allergen Reactions  . Statins Other (See Comments)    MYALGIAS    Current Outpatient Prescriptions  Medication Sig Dispense Refill  . aspirin 81 MG tablet Take 81 mg by mouth daily. 2 tablets daily      . b complex vitamins tablet Take 1 tablet by mouth daily.      Marland Kitchen  Chelated Zinc 50 MG TABS Take 1 tablet by mouth daily.      . finasteride (PROSCAR) 5 MG tablet Take 5 mg by mouth daily.      . fish oil-omega-3 fatty acids 1000 MG capsule Take 1,200 g by mouth daily.      . folic acid (FOLVITE) 800 MCG tablet Take 800 mcg by mouth daily.      . hydrochlorothiazide (HYDRODIURIL) 25 MG tablet Take 25 mg by mouth daily.      . Inositol Niacinate (NO FLUSH NIACIN) 100 MG TABS Take 1 tablet by mouth 2 (two) times daily.      . meloxicam (MOBIC) 7.5 MG tablet Take 7.5 mg by mouth daily.      . metoprolol tartrate (LOPRESSOR) 25 MG tablet Take 25 mg by mouth daily.      . Multiple Vitamins-Minerals (MENS MULTIVITAMIN PLUS PO) Take 1 tablet by mouth daily.      . Nettle, Urtica Dioica, (NETTLE LEAF PO) Take 10 mg by mouth daily.       . Potassium 99 MG TABS Take 1 tablet by mouth daily.      . ranitidine (ZANTAC) 75 MG tablet Take 75 mg by mouth daily.      . Red Yeast Rice Extract (RED YEAST RICE PO) Take 800 mg by mouth 2 (two) times daily.      . Rivaroxaban (XARELTO) 20 MG TABS tablet Take 1 tablet (20 mg total) by mouth daily with supper.  30 tablet  0  . tamsulosin (FLOMAX) 0.4 MG CAPS capsule Take 0.4 mg by mouth daily after supper.      Marland Kitchen UNABLE TO FIND Take 2 tablets by mouth daily. Med Name:Curamin       No current facility-administered medications for this visit.       Review of Systems:     Cardiac Review of Systems: Y or N  Chest Pain [  n  ]  Resting SOB [  n ] Exertional SOB  [ y ]  Pollyann Kennedy Milo.Brash  ]   Pedal Edema Milo.Brash   ]    Palpitations Cove.Etienne  ] Syncope  Milo.Brash  ]   Presyncope Milo.Brash   ]  General Review of Systems: [Y] = yes [  ]=no Constitional: recent weight change [n  ]; anorexia [  ]; fatigue [ y ]; nausea [  ]; night sweats [n  ]; fever [  n]; or chills Milo.Brash  ];                                                                                                                                          Dental: poor dentition[ n ]; Last Dentist visit: reguler dental visits q 6 months  Eye : blurred vision [  ]; diplopia [   ]; vision changes [  ];  Amaurosis fugax[  ]; Resp: cough [  n ];  wheezing[ n ];  hemoptysis[n  ]; shortness of breath[  ]; paroxysmal nocturnal dyspnea[  ]; dyspnea on exertion[  ]; or orthopnea[  ];  GI:  gallstones[n  ], vomiting[  ];  dysphagia[  ]; melena[n  ];  hematochezia [ n ]; heartburn[ n ];   Hx of  Colonoscopy[ y 8 years ago "clean" ]; GU: kidney stones [n  ]; hematuria[n  ];   dysuria Cove.Etienne  ];  nocturia[  ];  history of     obstruction [  y]; urinary frequency Cove.Etienne  ]             Skin: rash, swelling[  ];, hair loss[  ];  peripheral edema[  ];  or itching[  ]; Musculosketetal: myalgias[  ];  joint swelling[  ];  joint erythema[  ];  joint pain[  ];  back pain[  ];  Heme/Lymph: bruising[   ];  bleeding[  ];  anemia[  ];  Neuro: TIA[n  ];  headaches[n  ];  stroke[n  ];  vertigo[  ];  seizures[ n ];   paresthesias[ n ];  difficulty walking[ n ];  Psych:depression[  ]; anxiety[  ];  Endocrine: diabetes[  ];  thyroid dysfunction[  ];  Immunizations: Flu [ refused  ]; Pneumococcal[refused  ];  Other:  Physical Exam: BP 124/85  Pulse 90  Resp 20  Ht 5\' 10"  (1.778 m)  Wt 220 lb (99.791 kg)  BMI 31.57 kg/m2  SpO2 96%  Physical Examination: General appearance - alert, well appearing, and in no distress, oriented to person, place, and time and overweight Mental status - alert, oriented to person, place, and time, normal mood, behavior, speech, dress, motor activity, and thought processes Eyes - pupils equal and reactive, extraocular eye movements intact Ears - bilateral TM's and external ear canals normal, not examined Nose - normal and patent, no erythema, discharge or polyps Mouth - mucous membranes moist, pharynx normal without lesions Neck - supple, no significant adenopathy, carotids upstroke normal bilaterally, no bruits, thyroid exam: thyroid is normal in size without nodules or tenderness Chest - clear to auscultation, no wheezes, rales or rhonchi, symmetric air entry, no tachypnea, retractions or cyanosis Heart - normal rate, regular rhythm, normal S1, S2, no murmurs, rubs, clicks or gallops, diastolic murmur 2/6 at lower left sternal border, no JVD Abdomen - soft, nontender, nondistended, no masses or organomegaly no bladder distension noted no abdominal bruits no pulsatile masses no CVA tenderness no hernias noted Neurological - alert, oriented, normal speech, no focal findings or movement disorder noted, screening mental status exam normal, cranial nerves II through XII intact Musculoskeletal - no joint tenderness, deformity or swelling, no muscular tenderness noted, full range of motion without pain Extremities - peripheral pulses normal, no pedal edema, no clubbing  or cyanosis, intact peripheral pulses, feet normal, good pulses, normal color, temperature and sensation, monofilament sensory exam is normal in both feet Skin - normal coloration and turgor, no rashes, no suspicious skin lesions noted On physical exam no stigmata of Marfan's  Diagnostic Studies & Laboratory data:     Recent Radiology Findings:  ECHO:  Patient: Roel, Douthat MR #: [id number] Study Date: 10/15/2012 Gender: M Age: 62 Height: 175.3cm Weight: 101.6kg BSA: 2.22m^2 Pt. Status: Room:  ATTENDING Hortencia Conradi SONOGRAPHER Aida Raider, RDCS PERFORMING [facility], Site [facility] cc:  ------------------------------------------------------------ LV EF: 50% - 55%  ------------------------------------------------------------ Indications: Atrial fibrillation - 427.31.  ------------------------------------------------------------  History: PMH: Acquired from the patient and from the patient's chart. PMH: Atrial Fibrillation. History of Pulmonary Embolism. Obstructive Sleep Apnea-CPAP. Dilated Aortic Root. Risk factors: Hypertension. Obese. Dyslipidemia.  ------------------------------------------------------------ Study Conclusions  - Left ventricle: The cavity size was mildly dilated. Wall thickness was increased in a pattern of mild LVH. Systolic function was normal. The estimated ejection fraction was in the range of 50% to 55%. - Aortic valve: Mild regurgitation. - Aorta: The aorta was moderately dilated. - Left atrium: The atrium was moderately dilated. Echocardiography. M-mode, complete 2D, spectral Doppler, and color Doppler. Height: Height: 175.3cm. Height: 69in. Weight: Weight: 101.6kg. Weight: 223.5lb. Body mass index: BMI: 33.1kg/m^2. Body surface area: BSA: 2.69m^2. Blood pressure: 132/84. Patient status: Outpatient. Location: [facility]  3  ------------------------------------------------------------  ------------------------------------------------------------ Left ventricle: The cavity size was mildly dilated. Wall thickness was increased in a pattern of mild LVH. Systolic function was normal. The estimated ejection fraction was in the range of 50% to 55%.  ------------------------------------------------------------ Aortic valve: Structurally normal valve. Cusp separation was normal. Doppler: Transvalvular velocity was within the normal range. There was no stenosis. Mild regurgitation.  ------------------------------------------------------------ Aorta: The aorta was moderately dilated. Ascending aorta: The ascending aorta was mildly dilated.  ------------------------------------------------------------ Mitral valve: Doppler: Peak gradient: 5mm Hg (D).  ------------------------------------------------------------ Left atrium: The atrium was moderately dilated.  ------------------------------------------------------------ Right ventricle: The cavity size was normal. Systolic function was normal.  ------------------------------------------------------------ Tricuspid valve: Structurally normal valve. Leaflet separation was normal. Doppler: Transvalvular velocity was within the normal range. Trivial regurgitation.  ------------------------------------------------------------ Pulmonary artery: Systolic pressure was within the normal range.  ------------------------------------------------------------ Right atrium: The atrium was normal in size.  ------------------------------------------------------------ Pericardium: There was no pericardial effusion.  ------------------------------------------------------------ Systemic veins: Inferior vena cava: The vessel was normal in size; the respirophasic diameter changes were in the normal range (= 50%); findings are consistent with normal central  venous pressure.  ------------------------------------------------------------ Post procedure conclusions Ascending Aorta:  - The aorta was moderately dilated.  ------------------------------------------------------------  2D measurements Normal Doppler measurements Normal Left ventricle Main pulmonary LVID ED, 57.6 mm 43-52 artery chord, Pressure, 25 mm Hg =30 PLAX S LVID ES, 40.2 mm 23-38 Left ventricle chord, Ea, lat 14.6 cm/s ------ PLAX ann, tiss FS, chord, 30 % >29 DP PLAX E/Ea, lat 7.33 ------ LVPW, ED 11.9 mm ------ ann, tiss IVS/LVPW 1.08 <1.3 DP ratio, ED Ea, med 8.88 cm/s ------ Ventricular septum ann, tiss IVS, ED 12.9 mm ------ DP LVOT E/Ea, med 12.05 ------ Diam, S 24 mm ------ ann, tiss Area 4.52 cm^2 ------ DP Diam 24 mm ------ LVOT Aorta Peak vel, 66.6 cm/s ------ Root diam, 46 mm ------ S ED VTI, S 15.8 cm ------ AAo AP 48 mm ------ Stroke vol 71.5 ml ------ diam, S Stroke 32.9 ml/m^ ------ Left atrium index 2 AP dim 53 mm ------ Aortic valve AP dim 2.44 cm/m^2 <2.2 Regurg PHT 643 ms ------ index Mitral valve Peak E vel 107 cm/s ------ Decelerati 204 ms 150-23 on time 0 Peak 5 mm Hg ------ gradient, D Tricuspid valve Regurg 225 cm/s ------ peak vel Peak RV-RA 20 mm Hg ------ gradient, S Systemic veins Estimated 5 mm Hg ------ CVP Right ventricle Pressure, 25 mm Hg <30 S Sa vel, 10.1 cm/s ------ lat ann, tiss DP  ------------------------------------------------------------ Prepared and Electronically Authenticated by  Kristeen Miss [id number]:[id number]          Recent Lab Findings: Lab Results  Component Value Date   WBC 8.9 10/20/2012    05/11/2012 CT  of Chest: CT ANGIOGRAPHY CHEST Comparison: CT scan of June 20, 2008. Findings: No significant abnormality seen involving the pulmonary parenchyma. No pleural effusions or pneumothorax are noted. Normal contrast filling of pulmonary arteries is noted. There is no  evidence of aortic dissection. Coronary artery calcifications are noted consistent with coronary artery disease. There is mild dilatation of the ascending thoracic aorta is again noted which now measures 5.0 cm which is slightly increased compared to prior exam of 2010. No mass or adenopathy is noted in the mediastinum. Visualized portions of upper abdomen are within normal limits.  IMPRESSION: No evidence of thoracic aortic dissection is noted. Coronary artery calcifications are noted suggesting coronary artery disease. Aneurysmal dilatation of the ascending thoracic aorta is noted with maximum measured diameter of 5.0 cm which has increased compared to prior exam of 2010. Original Report Authenticated By: Lupita Raider., M.D.    02/2006 CT of Chest Satisfactory contrast-opacification of pulmonary vasculature. Right upper lobe evidence for pulmonary thromboembolism. Ascending aorta measures 4.4 cm transverse by 4.7 cm AP. Descending aorta measures 3.2 cm transverse by 3.1 cm AP. No acute pulmonary infiltrate. No pleural effusion. No adenopathy. Normal cardiac size. 11/02/2007 CT of Chest: Findings: The chest wall is stable. No supraclavicular or axillary adenopathy. The thyroid gland appears normal.  The heart is borderline enlarged but stable. No pericardial effusion. Stable small scattered mediastinal and hilar lymph nodes.  No change and fusiform aneurysmal dilatation of the ascending aorta with maximal measurements of 4.7 x 4.7 cm. No dissection. The major branch vessels are patent. The esophagus is grossly normal. There is a filling defect and a right lower lobe pulmonary artery consistent with pulmonary embolism. There is tortuosity of the descending thoracic aorta.  Examination of the lung parenchyma demonstrates minimal lingular scarring change. The airspace consolidation has resolved and this may be post pneumonic scarring. No infiltrates, edema or effusions. No  interstitial lung disease. No pulmonary masses or nodules.  The upper abdomen is stable. There is a small left adrenal gland nodule which is likely a benign adenoma.  IMPRESSION:  1. Incidental pulmonary embolism is noted in a right lower lobe pulmonary artery. 2. Near complete resolution of left lingular process. There is minimal residual probable post pneumonic scarring changes. No acute pulmonary findings or pulmonary masses. 3. Stable ascending thoracic aortic aneurysm. 4. Borderline cardiac size, stable. 5. Stable small mediastinal and hilar lymph nodes. 6. Stable left adrenal gland nodule, likely benign adenoma. 7. Stable periportal and celiac axis lymph nodes.  06/20/2008 CT of Chest: Comparison: CT chest without and with contrast 11/02/2007. Findings: Measured in the axial plane at a level approximately 2.5 cm above the aortic valve on series 4, image 30, maximum diameter of the ascending thoracic aorta remains unchanged at 4.7 cm. Tortuous aortic arch and proximal great vessels without significant atherosclerosis. Mild descending thoracic and upper abdominal aortic atherosclerosis. No evidence of aortic dissection. Heart mildly enlarged but stable. No visible coronary artery calcification. No pericardial effusion. Interval resolution of the segmental right lower lobe of pulmonary embolus; no central pulmonary emboli currently.  Stable scattered normal-sized mediastinal and hilar lymph nodes; no new, enlarging, or suspicious lymphadenopathy. Normal thyroid gland. Minimal scarring in the lingula and in the lower lobes. Lungs otherwise clear without localized airspace consolidation, interstitial disease, or nodularity. High resolution expiratory images demonstrate scattered areas of hyperlucency consistent with localized air trapping. Stable approximate 1.1 cm left adrenal adenoma. Normal-sized porta hepatis and celiac axis lymph nodes again noted and  unchanged. Remaining visualized upper abdomen unremarkable for the early arterial phase of enhancement. Bone window images again demonstrate mild thoracic spondylosis.  IMPRESSION: Since the prior CT chest 11/02/2007: 1. Stable ascending thoracic aortic aneurysm with maximum diameter approximately 4.7 cm. 2. Resolution of the right lower lobe segmental pulmonary embolus. No central pulmonary emboli currently. 3. Stable mild cardiomegaly. No acute cardiopulmonary disease. 4. Scattered areas of hyperlucency on the expiratory high resolution images consistent with localized areas of air trapping as can be seen in asthma and/or bronchitis. 5. Stable 1.1 cm left adrenal adenoma. IMPRESSION: Right upper lobe filling defect consistent with nonocclusive pulmonary thromboembolism. Ascending aortic enlargement measuring 4.4 cm transverse by 4.7 cm AP. No significant consolidation or effusion identified.  PFT's: FEV1 2.08  61% +10% post treatment  DLCO 115 %    Assessment / Plan:     1)  Dilated ascending aorta, chronic 4.7 cm in 2008 now 5 cm in 2014 (6 months ago),                    Aortic Size Index=    5     /Body surface area is 2.27 meters squared. = 2.20                                                     < 2.75 cm/m2      4% risk per year                                                       2.75 to 4.25          8% risk per year                                                      > 4.25 cm/m2    20% risk per year                                                       cross sectional area of aorta cm2/height in meters >                                                       19.63/1.778 = 11.04  #2 valvular heart disease by echo report: Currently the degree of aortic insufficiency is only mild, there also mild left ventricular dilatation (LVEDD = 57 cm).  #3 chronic atrial fibrillation on aspirin: now on anticoagulation #4 extensive coronary artery calcification on CT scan, positive  family history of early coronary artery disease, no previous history of ischemic events #5 history of pulmonary emboli x2, no history of workup for hypercoagulable state #6 obstructive sleep apnea/Moderaltly Severe  Airflow limitation #7 hyperlipidemia  #8 benign prostatic hypertrophy #9 history of hypertension, he is currently on a beta blocker  I have reviewed with the patient the finding of dilated aorta and mild to moderate aortic insufficiency on the available studies. The patient will need close follow up to determine the appropriate time for ascending aorta/root and probable aortic valve replacement in the future. Typically 5.5 cm is the size to consider elective ascending aortic replacement.    I'll plan to see him back in March 2015 with repaeat  CTA of the chest  Delight Ovens MD      [address] [place].[address] [place] [address] Office [phone]   Beeper [phone]  11/19/2012 12:25 PM

## 2012-11-20 ENCOUNTER — Ambulatory Visit (INDEPENDENT_AMBULATORY_CARE_PROVIDER_SITE_OTHER): Payer: 59 | Admitting: Pharmacist

## 2012-11-20 DIAGNOSIS — Z79899 Other long term (current) drug therapy: Secondary | ICD-10-CM

## 2012-11-20 DIAGNOSIS — I4891 Unspecified atrial fibrillation: Secondary | ICD-10-CM

## 2012-11-20 LAB — CBC
HCT: 43.7 % (ref 39.0–52.0)
Hemoglobin: 14.8 g/dL (ref 13.0–17.0)
MCHC: 33.8 g/dL (ref 30.0–36.0)
MCV: 92 fl (ref 78.0–100.0)
Platelets: 240 10*3/uL (ref 150.0–400.0)
RBC: 4.75 Mil/uL (ref 4.22–5.81)
RDW: 13.6 % (ref 11.5–14.6)
WBC: 10.1 10*3/uL (ref 4.5–10.5)

## 2012-11-20 LAB — BASIC METABOLIC PANEL
BUN: 20 mg/dL (ref 6–23)
CO2: 30 mEq/L (ref 19–32)
Calcium: 9.6 mg/dL (ref 8.4–10.5)
Chloride: 101 mEq/L (ref 96–112)
Creatinine, Ser: 1.1 mg/dL (ref 0.4–1.5)
GFR: 71.11 mL/min (ref >60.00)
Glucose, Bld: 98 mg/dL (ref 70–99)
Potassium: 3.8 mEq/L (ref 3.5–5.1)
Sodium: 137 mEq/L (ref 135–145)

## 2012-11-20 MED ORDER — RIVAROXABAN 20 MG PO TABS: 20.0000 mg | ORAL_TABLET | Freq: Every day | ORAL | Status: DC

## 2012-11-20 NOTE — Progress Notes (Signed)
Pt was started on Xarelto 20 mg daily for AFib (with h/o PE) on 10/20/2012.    Reviewed patients medication list.  Pt is not currently on any combined P-gp and strong CYP3A4 inhibitors/inducers (ketoconazole, traconazole, ritonavir, carbamazepine, phenytoin, rifampin, St. John's wort).  Reviewed labs.  SCr 1.1 , Weight 220 lbs, CrCl- 93 ml/min.  Dose appropriate based on CrCl.   Hgb and HCT Within Normal Limits.  Patient has no h/o CAD, so aspirin was d/c today.  He takes curamin (herbal) and mobic for pain medications.  Given he is also on Xarelto, I stressed the importance of monitoring for bleeding and bruising.  He states tylenol doesn't help for pain and these are the only things that have helped him in the past.  BMET and CBC drawn today as Xarelto started 4 weeks ago.    Today's results:  Scr remains stable at 1.1, and hemoglobin stable at 14.8 - patient notified that blood work was normal.  Next appointment and  lab test test in 6 months given patient on mobic and curamin for pain control as well.  Aspirin stopped today as no CAD.    A full discussion of the nature of anticoagulants has been carried out.  A benefit/risk analysis has been presented to the patient, so that they understand the justification for choosing anticoagulation with Xarelto at this time.  The need for compliance is stressed.  Pt is aware to take the medication once daily with the largest meal of the day.  Side effects of potential bleeding are discussed, including unusual colored urine or stools, coughing up blood or coffee ground emesis, nose bleeds or serious fall or head trauma.  Discussed signs and symptoms of stroke. The patient should avoid any OTC items containing aspirin or ibuprofen.  Avoid alcohol consumption.   Call if any signs of abnormal bleeding.  Discussed financial obligations and resolved any difficulty in obtaining medication.  Next appointment and  lab test test in 6 months given patient on mobic and curamin.

## 2012-11-20 NOTE — Patient Instructions (Addendum)
A full discussion of the nature of anticoagulants has been carried out.  A benefit/risk analysis has been presented to the patient, so that they understand the justification for choosing anticoagulation with Xarelto at this time.  The need for compliance is stressed.  Pt is aware to take the medication once daily with the largest meal of the day.  Side effects of potential bleeding are discussed, including unusual colored urine or stools, coughing up blood or coffee ground emesis, nose bleeds or serious fall or head trauma.  Discussed signs and symptoms of stroke. The patient should avoid any OTC items containing aspirin or ibuprofen.  Avoid alcohol consumption.   Call if any signs of abnormal bleeding.  Discussed financial obligations and resolved any difficulty in obtaining medication.  Next appointment and  lab test test in 6 months given patient on mobic and curamin.

## 2013-03-11 ENCOUNTER — Other Ambulatory Visit: Payer: Self-pay

## 2013-03-11 DIAGNOSIS — I712 Thoracic aortic aneurysm, without rupture, unspecified: Secondary | ICD-10-CM

## 2013-03-24 ENCOUNTER — Telehealth: Payer: Self-pay | Admitting: *Deleted

## 2013-03-24 NOTE — Telephone Encounter (Signed)
Spoke with the patient recently via phone. Patient stated he had sent several emails to me at the [place] email address. Explained to patient that was meant for work emails only and not patient communication as that would probably go to spam. Patient requested ok for ENT procedure be sent to Dr Thornell Mule as well as a copy of his office visit with Dr Meda Coffee be sent to him. Apologized to patient for any miscommunication and sent the requested things

## 2013-03-25 ENCOUNTER — Ambulatory Visit: Payer: 59 | Admitting: Cardiothoracic Surgery

## 2013-03-25 ENCOUNTER — Other Ambulatory Visit: Payer: 59

## 2013-04-22 ENCOUNTER — Ambulatory Visit: Payer: 59 | Admitting: Cardiothoracic Surgery

## 2013-04-22 ENCOUNTER — Other Ambulatory Visit: Payer: 59

## 2013-05-04 ENCOUNTER — Other Ambulatory Visit: Payer: Self-pay | Admitting: Cardiothoracic Surgery

## 2013-05-04 LAB — CREATININE, SERUM: Creat: 1.09 mg/dL (ref 0.50–1.35)

## 2013-05-04 LAB — BUN: BUN: 19 mg/dL (ref 6–23)

## 2013-05-06 ENCOUNTER — Ambulatory Visit (INDEPENDENT_AMBULATORY_CARE_PROVIDER_SITE_OTHER): Payer: 59 | Admitting: Cardiothoracic Surgery

## 2013-05-06 ENCOUNTER — Encounter: Payer: Self-pay | Admitting: Cardiothoracic Surgery

## 2013-05-06 ENCOUNTER — Ambulatory Visit
Admission: RE | Admit: 2013-05-06 | Discharge: 2013-05-06 | Disposition: A | Discharge status: Home / Self Care | Payer: 59 | Source: Ambulatory Visit | Attending: Cardiothoracic Surgery | Admitting: Cardiothoracic Surgery

## 2013-05-06 VITALS — BP 129/88 | HR 96 | Resp 20 | Ht 70.0 in | Wt 220.0 lb

## 2013-05-06 DIAGNOSIS — I712 Thoracic aortic aneurysm, without rupture, unspecified: Secondary | ICD-10-CM

## 2013-05-06 MED ORDER — IOHEXOL 350 MG/ML SOLN
80.0000 mL | Freq: Once | INTRAVENOUS | Status: AC | PRN
  Administered 2013-05-06: 80 mL via INTRAVENOUS

## 2013-05-06 NOTE — Progress Notes (Signed)
[address]       [place] [address]             [id number]                          Earnest E Brandes [place] Medical Record [id number] Date of Birth: 11/10/1945  Referring: No ref. provider found Primary Care: Dr Nona Dell Cardiology : Dr Ottie Glazier  Chief Complaint:    Chief Complaint  Patient presents with  . Thoracic Aortic Aneurysm    4 month f/u with CTA Chest    History of Present Illness:    Patient is a 68 year old male referred because of dilated ascending aorta noted on CT scan done at [facility] May 11 2012. The patient notes that in May he was referred to [facility] for cardiology clearance prior to EAR surgery. He notes that Dr. Jimmie Molly was doing a stress echo on him but never completed it because  a dilated aortic root , moderate aortic regurgitation and  mild to moderate mitral regurgitation was noted . A CT scan of the chest was performed following the echocardiogram 05/11/2012. He notes that he know his aorta was large for "years".   The patient also he has been in atrial fibrillation for several years and has a history of at least 2 pulmonary emboli. At the time he was last seen he was referred to Dr. Nelson/cardiology and because of both atrial fibrillation and history of pulmonary emboli was started on xarelto.  He denies anginal or exertional chest, is not limited by shortness of breath. He does have known sleep apnea and uses BiPAP at night. He does note some fatigue  He has had no previous history of myocardial infarction, but does have a brother who had a myocardial infarction at age 72. There is no family history of Marfan's or acute aortic dissections.      Current Activity/ Functional Status:  Patient is independent with mobility/ambulation, transfers, ADL's, IADL's.  Zubrod Score: At the time of surgery this patient's most appropriate activity status/level should be described as: []  Normal  activity, no symptoms [x]  Symptoms, fully ambulatory []  Symptoms, in bed less than or equal to 50% of the time []  Symptoms, in bed greater than 50% of the time but less than 100% []  Bedridden []  Moribund   Past Medical History  Diagnosis Date  . Hypertension   . Sleep apnea     CPAP  . BPH (benign prostatic hyperplasia)   . Pulmonary embolism   . Atrial fibrillation   . Hyperlipidemia   . Obesity   . Rhinitis   . Aortic regurgitation   . Aortic root aneurysm     Past Surgical History  Procedure Laterality Date  . Appendectomy    . Tonsillectomy    . Hernia repair      Family History  Problem Relation Age of Onset  . Stroke Mother   . Stroke Father   . Alzheimer's disease Mother   . Heart attack Mother   . Diabetes Mellitus II Father    No family history of Marfan's or aortic dissection.   History  Smoking status  . Never Smoker   Smokeless tobacco  . Never Used    History  Alcohol Use No     Allergies  Allergen Reactions  . Statins Other (See Comments)    MYALGIAS    Current Outpatient Prescriptions  Medication Sig Dispense Refill  . b complex vitamins tablet Take 1 tablet by mouth daily.      . Chelated Zinc 50 MG TABS Take 1 tablet by mouth daily.      . finasteride (PROSCAR) 5 MG tablet Take 5 mg by mouth daily.      . fish oil-omega-3 fatty acids 1000 MG capsule Take 1,200 g by mouth daily.      . folic acid (FOLVITE) 660 MCG tablet Take 800 mcg by mouth daily.      . hydrochlorothiazide (HYDRODIURIL) 25 MG tablet Take 25 mg by mouth daily.      . Inositol Niacinate (NO FLUSH NIACIN) 100 MG TABS Take 1 tablet by mouth 2 (two) times daily.      . meloxicam (MOBIC) 7.5 MG tablet Take 7.5 mg by mouth daily.      . metoprolol tartrate (LOPRESSOR) 25 MG tablet Take 25 mg by mouth daily.      . Multiple Vitamins-Minerals (MENS MULTIVITAMIN PLUS PO) Take 1 tablet by mouth daily.      . Nettle, Urtica Dioica, (NETTLE LEAF PO) Take 10 mg by mouth  daily.      . Potassium 99 MG TABS Take 1 tablet by mouth daily.      . ranitidine (ZANTAC) 75 MG tablet Take 75 mg by mouth daily.      . Red Yeast Rice Extract (RED YEAST RICE PO) Take 800 mg by mouth 2 (two) times daily.      . Rivaroxaban (XARELTO) 20 MG TABS tablet Take 1 tablet (20 mg total) by mouth daily with supper.  30 tablet  8  . tamsulosin (FLOMAX) 0.4 MG CAPS capsule Take 0.4 mg by mouth daily after supper.      Marland Kitchen UNABLE TO FIND Take 2 tablets by mouth daily. Med Name:Curamin       No current facility-administered medications for this visit.       Review of Systems:     Cardiac Review of Systems: Y or N  Chest Pain [  n  ]  Resting SOB [  n ] Exertional SOB  [ y ]  Vertell Limber Florencio.Farrier  ]   Pedal Edema Florencio.Farrier   ]    Palpitations Blue.Reese  ] Syncope  Florencio.Farrier  ]   Presyncope Florencio.Farrier   ]  General Review of Systems: [Y] = yes [  ]=no Constitional: recent weight change [n  ]; anorexia [  ]; fatigue [ y ]; nausea [  ]; night sweats [n  ]; fever [  n]; or chills Florencio.Farrier  ];                                                                                                                                          Dental: poor dentition[ n ]; Last Dentist visit: reguler dental visits q 6 months  Eye :  blurred vision [  ]; diplopia [   ]; vision changes [  ];  Amaurosis fugax[  ]; Resp: cough [ n ];  wheezing[ n ];  hemoptysis[n  ]; shortness of breath[  ]; paroxysmal nocturnal dyspnea[  ]; dyspnea on exertion[  ]; or orthopnea[  ];  GI:  gallstones[n  ], vomiting[  ];  dysphagia[  ]; melena[n  ];  hematochezia [ n ]; heartburn[ n ];   Hx of  Colonoscopy[ y 8 years ago "clean" ]; GU: kidney stones [n  ]; hematuria[n  ];   dysuria Blue.Reese  ];  nocturia[  ];  history of     obstruction [  y]; urinary frequency Blue.Reese  ]             Skin: rash, swelling[  ];, hair loss[  ];  peripheral edema[  ];  or itching[  ]; Musculosketetal: myalgias[  ];  joint swelling[  ];  joint erythema[  ];  joint pain[  ];  back pain[  ];  Heme/Lymph:  bruising[  ];  bleeding[  ];  anemia[  ];  Neuro: TIA[n  ];  headaches[n  ];  stroke[n  ];  vertigo[  ];  seizures[ n ];   paresthesias[ n ];  difficulty walking[ n ];  Psych:depression[  ]; anxiety[  ];  Endocrine: diabetes[  ];  thyroid dysfunction[  ];  Immunizations: Flu [ refused  ]; Pneumococcal[refused  ];  Other:  Physical Exam: BP 129/88  Pulse 96  Resp 20  Ht 5\' 10"  (1.778 m)  Wt 220 lb (99.791 kg)  BMI 31.57 kg/m2  SpO2 95%  Physical Examination: General appearance - alert, well appearing, and in no distress, oriented to person, place, and time and overweight Mental status - alert, oriented to person, place, and time, normal mood, behavior, speech, dress, motor activity, and thought processes Eyes - pupils equal and reactive, extraocular eye movements intact Ears - bilateral TM's and external ear canals normal, not examined Nose - normal and patent, no erythema, discharge or polyps Mouth - mucous membranes moist, pharynx normal without lesions Neck - supple, no significant adenopathy, carotids upstroke normal bilaterally, no bruits, thyroid exam: thyroid is normal in size without nodules or tenderness Chest - clear to auscultation, no wheezes, rales or rhonchi, symmetric air entry, no tachypnea, retractions or cyanosis Heart - normal rate, regular rhythm, normal S1, S2, no murmurs, rubs, clicks or gallops, diastolic murmur 2/6 at lower left sternal border, no JVD Abdomen - soft, nontender, nondistended, no masses or organomegaly no abdominal bruits no pulsatile masses no CVA tenderness no hernias noted Neurological - alert, oriented, normal speech, no focal findings or movement disorder noted, screening mental status exam normal, cranial nerves II through XII intact Musculoskeletal - no joint tenderness, deformity or swelling, no muscular tenderness noted, full range of motion without pain Extremities - peripheral pulses normal, no pedal edema, no clubbing or cyanosis,  intact peripheral pulses, feet normal, good pulses, normal color, temperature and sensation, monofilament sensory exam is normal in both feet Skin - normal coloration and turgor, no rashes, no suspicious skin lesions noted On physical exam no stigmata of Marfan's  Diagnostic Studies & Laboratory data:     Recent Radiology Findings:  ECHO:  Patient: Aaron Richmond, [place] MR #: [id number] Study Date: 10/15/2012 Gender: M Age: 34 Height: 175.3cm Weight: 101.6kg BSA: 2.65m^2 Pt. Status: Room:  ATTENDING Jimmy Picket SONOGRAPHER Cindy Hazy, [place]  [facility] cc:  ------------------------------------------------------------ LV EF: 50% - 55%  ------------------------------------------------------------ Indications: Atrial fibrillation - 427.31.  ------------------------------------------------------------ History: PMH: Acquired from the patient and from the patient's chart. PMH: Atrial Fibrillation. History of Pulmonary Embolism. Obstructive Sleep Apnea-CPAP. Dilated Aortic Root. Risk factors: Hypertension. Obese. Dyslipidemia.  ------------------------------------------------------------ Study Conclusions  - Left ventricle: The cavity size was mildly dilated. Wall thickness was increased in a pattern of mild LVH. Systolic function was normal. The estimated ejection fraction was in the range of 50% to 55%. - Aortic valve: Mild regurgitation. - Aorta: The aorta was moderately dilated. - Left atrium: The atrium was moderately dilated. Echocardiography. M-mode, complete 2D, spectral Doppler, and color Doppler. Height: Height: 175.3cm. Height: 69in. Weight: Weight: 101.6kg. Weight: 223.5lb. Body mass index: BMI: 33.1kg/m^2. Body surface area: BSA: 2.41m^2. Blood pressure: 132/84. Patient status: Outpatient. Location: [facility]  3  ------------------------------------------------------------  ------------------------------------------------------------ Left ventricle: The cavity size was mildly dilated. Wall thickness was increased in a pattern of mild LVH. Systolic function was normal. The estimated ejection fraction was in the range of 50% to 55%.  ------------------------------------------------------------ Aortic valve: Structurally normal valve. Cusp separation was normal. Doppler: Transvalvular velocity was within the normal range. There was no stenosis. Mild regurgitation.  ------------------------------------------------------------ Aorta: The aorta was moderately dilated. Ascending aorta: The ascending aorta was mildly dilated.  ------------------------------------------------------------ Mitral valve: Doppler: Peak gradient: 46mm Hg (D).  ------------------------------------------------------------ Left atrium: The atrium was moderately dilated.  ------------------------------------------------------------ Right ventricle: The cavity size was normal. Systolic function was normal.  ------------------------------------------------------------ Tricuspid valve: Structurally normal valve. Leaflet separation was normal. Doppler: Transvalvular velocity was within the normal range. Trivial regurgitation.  ------------------------------------------------------------ Pulmonary artery: Systolic pressure was within the normal range.  ------------------------------------------------------------ Right atrium: The atrium was normal in size.  ------------------------------------------------------------ Pericardium: There was no pericardial effusion.  ------------------------------------------------------------ Systemic veins: Inferior vena cava: The vessel was normal in size; the respirophasic diameter changes were in the normal range (= 50%); findings are consistent with normal central  venous pressure.  ------------------------------------------------------------ Post procedure conclusions Ascending Aorta:  - The aorta was moderately dilated.  ------------------------------------------------------------  2D measurements Normal Doppler measurements Normal Left ventricle Main pulmonary LVID ED, 57.6 mm 43-52 artery chord, Pressure, 25 mm Hg =30 PLAX S LVID ES, 40.2 mm 23-38 Left ventricle chord, Ea, lat 14.6 cm/s ------ PLAX ann, tiss FS, chord, 30 % >29 DP PLAX E/Ea, lat 7.33 ------ LVPW, ED 11.9 mm ------ ann, tiss IVS/LVPW 1.08 <1.3 DP ratio, ED Ea, med 8.88 cm/s ------ Ventricular septum ann, tiss IVS, ED 12.9 mm ------ DP LVOT E/Ea, med 12.05 ------ Diam, S 24 mm ------ ann, tiss Area 4.52 cm^2 ------ DP Diam 24 mm ------ LVOT Aorta Peak vel, 66.6 cm/s ------ Root diam, 46 mm ------ S ED VTI, S 15.8 cm ------ AAo AP 48 mm ------ Stroke vol 71.5 ml ------ diam, S Stroke 32.9 ml/m^ ------ Left atrium index 2 AP dim 53 mm ------ Aortic valve AP dim 2.44 cm/m^2 <2.2 Regurg PHT 643 ms ------ index Mitral valve Peak E vel 107 cm/s ------ Decelerati 204 ms 150-23 on time 0 Peak 5 mm Hg ------ gradient, D Tricuspid valve Regurg 225 cm/s ------ peak vel Peak RV-RA 20 mm Hg ------ gradient, S Systemic veins Estimated 5 mm Hg ------ CVP Right ventricle Pressure, 25 mm Hg <30 S Sa vel, 10.1 cm/s ------ lat ann, tiss DP  ------------------------------------------------------------ Prepared and Electronically Authenticated by  Mertie Moores 2014-10-09T17:45:[id number]  Recent Lab Findings: Lab Results  Component Value Date   WBC 10.1 11/20/2012   Ct Angio Chest Aorta W/cm &/or Wo/cm  05/06/2013   CLINICAL DATA:  Aortic root evaluation  EXAM: CT ANGIOGRAPHY CHEST WITH CONTRAST  TECHNIQUE: Multidetector CT imaging of the chest was performed using the standard protocol during bolus administration of intravenous contrast.  Multiplanar CT image reconstructions and MIPs were obtained to evaluate the vascular anatomy.  CONTRAST:  73mL OMNIPAQUE IOHEXOL 350 MG/ML SOLN  COMPARISON:  None.  FINDINGS: Maximal aortic diameter is at the sinus of Valsalva, sino-tubular junction, and ascending aorta are 4.5 cm, 4.9 cm, and 4.6 cm respectively. Mild coronary artery calcifications in the circumflex and LAD coronary arteries. Minimal right coronary artery calcifications.  Great vessels are patent. Vertebral arteries are patent within the confines of the exam.  There is mild dilatation of the left atrium. Very minimal peripheral aortic valve calcifications.  Limited images of the abdomen demonstrate mild narrowing at the origin of the celiac axis and patency at the origin of the SMA.  No abnormal mediastinal adenopathy.  No mediastinal mass effect.  Bibasilar atelectasis.  No pneumothorax or pleural effusion. Chronic right upper rib deformities. No definite acute bony deformity. Posterior disc osteophytes at C6-7 with severe disc space narrowing.  Review of the MIP images confirms the above findings.  IMPRESSION: There is significant dilatation of the aortic root as described. The sino-tubular junction is 4.9 cm in diameter. No evidence of dissection. Minimal peripheral aortic valvular calcifications.   Electronically Signed   By: Maryclare Bean M.D.   On: 05/06/2013 16:21   05/11/2012 CT of Chest: CT ANGIOGRAPHY CHEST Comparison: CT scan of June 20, 2008. Findings: No significant abnormality seen involving the pulmonary parenchyma. No pleural effusions or pneumothorax are noted. Normal contrast filling of pulmonary arteries is noted. There is no evidence of aortic dissection. Coronary artery calcifications are noted consistent with coronary artery disease. There is mild dilatation of the ascending thoracic aorta is again noted which now measures 5.0 cm which is slightly increased compared to prior exam of 2010. No mass or adenopathy is noted  in the mediastinum. Visualized portions of upper abdomen are within normal limits.  IMPRESSION: No evidence of thoracic aortic dissection is noted. Coronary artery calcifications are noted suggesting coronary artery disease. Aneurysmal dilatation of the ascending thoracic aorta is noted with maximum measured diameter of 5.0 cm which has increased compared to prior exam of 2010. Original Report Authenticated By: Marijo Conception., M.D.    02/2006 CT of Chest Satisfactory contrast-opacification of pulmonary vasculature. Right upper lobe evidence for pulmonary thromboembolism. Ascending aorta measures 4.4 cm transverse by 4.7 cm AP. Descending aorta measures 3.2 cm transverse by 3.1 cm AP. No acute pulmonary infiltrate. No pleural effusion. No adenopathy. Normal cardiac size. 11/02/2007 CT of Chest: Findings: The chest wall is stable. No supraclavicular or axillary adenopathy. The thyroid gland appears normal.  The heart is borderline enlarged but stable. No pericardial effusion. Stable small scattered mediastinal and hilar lymph nodes.  No change and fusiform aneurysmal dilatation of the ascending aorta with maximal measurements of 4.7 x 4.7 cm. No dissection. The major branch vessels are patent. The esophagus is grossly normal. There is a filling defect and a right lower lobe pulmonary artery consistent with pulmonary embolism. There is tortuosity of the descending thoracic aorta.  Examination of the lung parenchyma demonstrates minimal lingular scarring change. The airspace consolidation has resolved and this may be post pneumonic scarring.  No infiltrates, edema or effusions. No interstitial lung disease. No pulmonary masses or nodules.  The upper abdomen is stable. There is a small left adrenal gland nodule which is likely a benign adenoma.  IMPRESSION:  1. Incidental pulmonary embolism is noted in a right lower lobe pulmonary artery. 2. Near complete resolution of left  lingular process. There is minimal residual probable post pneumonic scarring changes. No acute pulmonary findings or pulmonary masses. 3. Stable ascending thoracic aortic aneurysm. 4. Borderline cardiac size, stable. 5. Stable small mediastinal and hilar lymph nodes. 6. Stable left adrenal gland nodule, likely benign adenoma. 7. Stable periportal and celiac axis lymph nodes.  06/20/2008 CT of Chest: Comparison: CT chest without and with contrast 11/02/2007. Findings: Measured in the axial plane at a level approximately 2.5 cm above the aortic valve on series 4, image 30, maximum diameter of the ascending thoracic aorta remains unchanged at 4.7 cm. Tortuous aortic arch and proximal great vessels without significant atherosclerosis. Mild descending thoracic and upper abdominal aortic atherosclerosis. No evidence of aortic dissection. Heart mildly enlarged but stable. No visible coronary artery calcification. No pericardial effusion. Interval resolution of the segmental right lower lobe of pulmonary embolus; no central pulmonary emboli currently.  Stable scattered normal-sized mediastinal and hilar lymph nodes; no new, enlarging, or suspicious lymphadenopathy. Normal thyroid gland. Minimal scarring in the lingula and in the lower lobes. Lungs otherwise clear without localized airspace consolidation, interstitial disease, or nodularity. High resolution expiratory images demonstrate scattered areas of hyperlucency consistent with localized air trapping. Stable approximate 1.1 cm left adrenal adenoma. Normal-sized porta hepatis and celiac axis lymph nodes again noted and unchanged. Remaining visualized upper abdomen unremarkable for the early arterial phase of enhancement. Bone window images again demonstrate mild thoracic spondylosis.  IMPRESSION: Since the prior CT chest 11/02/2007: 1. Stable ascending thoracic aortic aneurysm with maximum diameter approximately 4.7 cm. 2.  Resolution of the right lower lobe segmental pulmonary embolus. No central pulmonary emboli currently. 3. Stable mild cardiomegaly. No acute cardiopulmonary disease. 4. Scattered areas of hyperlucency on the expiratory high resolution images consistent with localized areas of air trapping as can be seen in asthma and/or bronchitis. 5. Stable 1.1 cm left adrenal adenoma. IMPRESSION: Right upper lobe filling defect consistent with nonocclusive pulmonary thromboembolism. Ascending aortic enlargement measuring 4.4 cm transverse by 4.7 cm AP. No significant consolidation or effusion identified.  PFT's: FEV1 2.08  61% +10% post treatment  DLCO 115 %    Assessment / Plan:     1)  Dilated ascending aorta, chronic 4.7 cm in 2008 now 5 cm in 2014 (6 months ago),                    Aortic Size Index=    5     /Body surface area is 2.27 meters squared. = 2.20                                                     < 2.75 cm/m2      4% risk per year  2.75 to 4.25          8% risk per year                                                      > 4.25 cm/m2    20% risk per year                                                       cross sectional area of aorta cm2/height in meters >                                                       19.63/1.778 = 11.04  #2 valvular heart disease by echo report: Currently the degree of aortic insufficiency is only mild, there also mild left ventricular dilatation (LVEDD = 57 cm).  #3 chronic atrial fibrillation on aspirin: now on anticoagulation #4 extensive coronary artery calcification on CT scan, positive family history of early coronary artery disease, no previous history of ischemic events #5 history of pulmonary emboli x2, no history of workup for hypercoagulable state #6 obstructive sleep apnea/Moderaltly Severe Airflow limitation #7 hyperlipidemia  #8 benign prostatic hypertrophy #9 history of hypertension,  he is currently on a beta blocker  I have reviewed with the patient the finding of dilated aorta, with no increase in size of his aorta since the last scan. I recommended to have repeat CTA of the chest in 8 months   Grace Isaac MD      [address] [place] [address] Office [phone]   Beeper [phone]  05/06/2013 5:30 PM

## 2013-06-24 ENCOUNTER — Ambulatory Visit (HOSPITAL_COMMUNITY): Payer: 59 | Attending: Cardiology | Admitting: Radiology

## 2013-06-24 ENCOUNTER — Ambulatory Visit (INDEPENDENT_AMBULATORY_CARE_PROVIDER_SITE_OTHER): Payer: 59 | Admitting: Cardiology

## 2013-06-24 ENCOUNTER — Encounter: Payer: Self-pay | Admitting: Cardiology

## 2013-06-24 ENCOUNTER — Encounter (INDEPENDENT_AMBULATORY_CARE_PROVIDER_SITE_OTHER): Payer: Self-pay

## 2013-06-24 ENCOUNTER — Ambulatory Visit (INDEPENDENT_AMBULATORY_CARE_PROVIDER_SITE_OTHER): Payer: 59 | Admitting: *Deleted

## 2013-06-24 ENCOUNTER — Telehealth: Payer: Self-pay | Admitting: *Deleted

## 2013-06-24 VITALS — BP 122/80 | HR 74 | Ht 70.0 in | Wt 221.0 lb

## 2013-06-24 DIAGNOSIS — I059 Rheumatic mitral valve disease, unspecified: Secondary | ICD-10-CM | POA: Insufficient documentation

## 2013-06-24 DIAGNOSIS — I359 Nonrheumatic aortic valve disorder, unspecified: Secondary | ICD-10-CM | POA: Insufficient documentation

## 2013-06-24 DIAGNOSIS — I4891 Unspecified atrial fibrillation: Secondary | ICD-10-CM

## 2013-06-24 DIAGNOSIS — I079 Rheumatic tricuspid valve disease, unspecified: Secondary | ICD-10-CM | POA: Insufficient documentation

## 2013-06-24 DIAGNOSIS — I482 Chronic atrial fibrillation, unspecified: Secondary | ICD-10-CM

## 2013-06-24 DIAGNOSIS — I7121 Aneurysm of the ascending aorta, without rupture: Secondary | ICD-10-CM

## 2013-06-24 DIAGNOSIS — I719 Aortic aneurysm of unspecified site, without rupture: Secondary | ICD-10-CM

## 2013-06-24 DIAGNOSIS — I480 Paroxysmal atrial fibrillation: Secondary | ICD-10-CM

## 2013-06-24 DIAGNOSIS — I351 Nonrheumatic aortic (valve) insufficiency: Secondary | ICD-10-CM

## 2013-06-24 DIAGNOSIS — Z86711 Personal history of pulmonary embolism: Secondary | ICD-10-CM | POA: Insufficient documentation

## 2013-06-24 DIAGNOSIS — I77819 Aortic ectasia, unspecified site: Secondary | ICD-10-CM | POA: Insufficient documentation

## 2013-06-24 DIAGNOSIS — G4733 Obstructive sleep apnea (adult) (pediatric): Secondary | ICD-10-CM | POA: Insufficient documentation

## 2013-06-24 LAB — BASIC METABOLIC PANEL
BUN: 27 mg/dL — ABNORMAL HIGH (ref 6–23)
CO2: 34 mEq/L — ABNORMAL HIGH (ref 19–32)
Calcium: 9.7 mg/dL (ref 8.4–10.5)
Chloride: 101 mEq/L (ref 96–112)
Creatinine, Ser: 1.3 mg/dL (ref 0.4–1.5)
GFR: 61.25 mL/min (ref >60.00)
Glucose, Bld: 92 mg/dL (ref 70–99)
Potassium: 4.5 mEq/L (ref 3.5–5.1)
Sodium: 138 mEq/L (ref 135–145)

## 2013-06-24 LAB — CBC
HCT: 45.2 % (ref 39.0–52.0)
Hemoglobin: 15.2 g/dL (ref 13.0–17.0)
MCHC: 33.6 g/dL (ref 30.0–36.0)
MCV: 93.1 fl (ref 78.0–100.0)
Platelets: 264 10*3/uL (ref 150.0–400.0)
RBC: 4.86 Mil/uL (ref 4.22–5.81)
RDW: 14 % (ref 11.5–15.5)
WBC: 10.1 10*3/uL (ref 4.0–10.5)

## 2013-06-24 NOTE — Telephone Encounter (Signed)
Pt notified of recent lab results today and showing pt is dehydrated.  Per Dr Meda Coffee this pt should increase his fluid intake, especially in the next couple of days.  Pt states he's been working outdoors a lot, especially in the evenings.  Pt verbalized understanding and agrees with this plan.

## 2013-06-24 NOTE — Patient Instructions (Signed)
Your physician recommends that you continue on your current medications as directed. Please refer to the Current Medication list given to you today.  Your physician has requested that you have an echocardiogram. Echocardiography is a painless test that uses sound waves to create images of your heart. It provides your doctor with information about the size and shape of your heart and how well your heart's chambers and valves are working. This procedure takes approximately one hour. There are no restrictions for this procedure. THIS SHOULD BE DONE PRIOR TO YOUR ONE YEAR FOLLOW-UP APPOINTMENT WITH DR Meda Coffee  Your physician wants you to follow-up in: [facility] will receive a reminder letter in the mail two months in advance. If you don't receive a letter, please call our office to schedule the follow-up appointment.

## 2013-06-24 NOTE — Telephone Encounter (Signed)
Message copied by Nuala Alpha on Thu Jun 24, 2013  3:20 PM ------      Message from: Dorothy Spark      Created: Thu Jun 24, 2013  1:01 PM       Based on the lab results today the patient is dehydrated and he should increase his fluid intake in the next few days. ------

## 2013-06-24 NOTE — Progress Notes (Signed)
Echocardiogram performed.  

## 2013-06-24 NOTE — Progress Notes (Signed)
Patient ID: TOBI LEINWEBER, male   DOB: 1945-09-27, 68 y.o.   MRN: [id number]    Patient Name: Aaron Richmond Date of Encounter: 06/24/2013  Primary Care Provider:  PROVIDER NOT IN SYSTEM Primary Cardiologist:  Dorothy Spark  Patient Profile  Atrial fibrillation  Problem List   Past Medical History  Diagnosis Date  . Hypertension   . Sleep apnea     CPAP  . BPH (benign prostatic hyperplasia)   . Pulmonary embolism   . Atrial fibrillation   . Hyperlipidemia   . Obesity   . Rhinitis   . Aortic regurgitation   . Aortic root aneurysm    Past Surgical History  Procedure Laterality Date  . Appendectomy    . Tonsillectomy    . Hernia repair      Allergies  Allergies  Allergen Reactions  . Statins Other (See Comments)    MYALGIAS    HPI  68 year old with h/o hypertension and hyperlipidemia with known dilated ascending aorta noted on CT in 2008 measured at 4.7 cm and 5 cm in May 2014. The patient was referred to [facility] for preop visit prior to the ear surgery. He was scheduled for a stress echo that was not performed as there was a concern about dilated root and use of Dobutamine.  The patient also states that he has been in atrial fibrillation for a while (unsure for how long). He has never been on anticoagulation for it. He has history of two pulmonary emboli, both after a long plane flight. He was using Coumadin for 6 weeks each time.  The patient states that he is quite active, he is employed and he spends his weekends working in the back yard. He has never felt limited by shortness of breath. He has never had chest pain No palpitations and no syncope. He does have known sleep apnea and uses BiPAP at night.   Patient's brother had a myocardial infarction at age 77.   The patient is coming up to 8 months, he is completely asymptomatic and denies any shortness of breath, chest pain, palpitations or syncope. He needs a clearance letter for your  surgery. He had repeat chest CT with stable size of his aortic root and ascending aortic aneurysm.  Home Medications  Prior to Admission medications   Medication Sig Start Date End Date Taking? Authorizing Provider  aspirin 81 MG tablet Take 81 mg by mouth daily. 2 tablets daily   Yes Historical Provider, MD  b complex vitamins tablet Take 1 tablet by mouth daily.   Yes Historical Provider, MD  Chelated Zinc 50 MG TABS Take 1 tablet by mouth daily.   Yes Historical Provider, MD  finasteride (PROSCAR) 5 MG tablet Take 5 mg by mouth daily.   Yes Historical Provider, MD  fish oil-omega-3 fatty acids 1000 MG capsule Take 1,200 g by mouth daily.   Yes Historical Provider, MD  folic acid (FOLVITE) 026 MCG tablet Take 800 mcg by mouth daily.   Yes Historical Provider, MD  hydrochlorothiazide (HYDRODIURIL) 25 MG tablet Take 25 mg by mouth daily.   Yes Historical Provider, MD  Inositol Niacinate (NO FLUSH NIACIN) 100 MG TABS Take 1 tablet by mouth 2 (two) times daily.   Yes Historical Provider, MD  meloxicam (MOBIC) 7.5 MG tablet Take 7.5 mg by mouth daily.   Yes Historical Provider, MD  metoprolol tartrate (LOPRESSOR) 25 MG tablet Take 25 mg by mouth daily.   Yes Historical Provider,  MD  Multiple Vitamins-Minerals (MENS MULTIVITAMIN PLUS PO) Take 1 tablet by mouth daily.   Yes Historical Provider, MD  Nettle, Urtica Dioica, (NETTLE LEAF PO) Take 10 mg by mouth daily.   Yes Historical Provider, MD  Potassium 99 MG TABS Take 1 tablet by mouth daily.   Yes Historical Provider, MD  ranitidine (ZANTAC) 75 MG tablet Take 75 mg by mouth daily.   Yes Historical Provider, MD  Red Yeast Rice Extract (RED YEAST RICE PO) Take 800 mg by mouth 2 (two) times daily.   Yes Historical Provider, MD  tamsulosin (FLOMAX) 0.4 MG CAPS capsule Take 0.4 mg by mouth daily after supper.   Yes Historical Provider, MD  UNABLE TO FIND Take 2 tablets by mouth daily. Med Name:Curamin   Yes Historical Provider, MD    Family  History  Family History  Problem Relation Age of Onset  . Stroke Mother   . Stroke Father   . Alzheimer's disease Mother   . Heart attack Mother   . Diabetes Mellitus II Father     Social History  History   Social History  . Marital Status: Married    Spouse Name: N/A    Number of Children: 0  . Years of Education: N/A   Occupational History  . Engineering geologist    Social History Main Topics  . Smoking status: Never Smoker   . Smokeless tobacco: Never Used  . Alcohol Use: No  . Drug Use: No  . Sexual Activity: Not on file   Other Topics Concern  . Not on file   Social History Narrative  . No narrative on file     Review of Systems General:  No chills, fever, night sweats or weight changes.  Cardiovascular:  No chest pain, dyspnea on exertion, edema, orthopnea, palpitations, paroxysmal nocturnal dyspnea. Dermatological: No rash, lesions/masses Respiratory: No cough, dyspnea Urologic: No hematuria, dysuria Abdominal:   No nausea, vomiting, diarrhea, bright red blood per rectum, melena, or hematemesis Neurologic:  No visual changes, wkns, changes in mental status. All other systems reviewed and are otherwise negative except as noted above.  Physical Exam  Blood pressure 122/80, pulse 74, height 5\' 10"  (1.778 m), weight 221 lb (100.245 kg).  General: Pleasant, NAD Psych: Normal affect. Neuro: Alert and oriented X 3. Moves all extremities spontaneously. HEENT: Normal  Neck: Supple without bruits or JVD. Lungs:  Resp regular and unlabored, CTA. Heart: RRR no s3, s4, or murmurs. Abdomen: Soft, non-tender, non-distended, BS + x 4.  Extremities: No clubbing, cyanosis or edema. DP/PT/Radials 2+ and equal bilaterally.  Accessory Clinical Findings  ECG - A fib rate controlled 82 BPM, non-specific ST changes  TTE: 10/15/2012 Study Conclusions  - Left ventricle: The cavity size was mildly dilated. Wall thickness was increased in a pattern of mild LVH.  Systolic function was normal. The estimated ejection fraction was in the range of 50% to 55%. - Aortic valve: Mild regurgitation. - Aorta: The aorta was moderately dilated. - Left atrium: The atrium was moderately dilated.    Assessment & Plan  Assessment / Plan:  1) Dilated ascending aorta, chronic 4.7 cm in 2008, stable in April 2015. Dr [name] evaluated the patient, wants to follow closely and operate when ascending aorta reaches 5.5 cm unless there is another indication. Currently the degree of aortic insufficiency is only mild, there also mild left ventricular dilatation (LVEDD = 57 cm).   The patient has extensive coronary artery calcification on CT scan, positive family history  of early coronary artery disease. However, the patient is acting completely asymptomatic, he has no signs of angina or heart failure and doesn't require any stress test.  2) Chronic atrial fibrillation on aspirin (assuming nonvalvular afib chad2 =1)  His CHA2DS2-VASc score is 2. However, the history of at least 2 prior pulmonary embolisms places him at the higher risk of thromboembolic event. The patient was started on Xarelto that he's tolerating very well.  3) Hyperlipidemia - statin intolerance, but controlled on niacin, red yeast rice and diet  4. Hypertension - controlled on Metoprolol  5. Preop evaluation - the patient is currently asymptomatic for CAD or CHF. He is physically active and can achieve minimum of 8 METS. There is currently no contraindication to his ear surgery. Continue metoprolol in the perioperative period.    Dorothy Spark, MD 06/24/2013, 11:58 AM

## 2013-06-24 NOTE — Progress Notes (Signed)
Pt was started on Xarelto 20mg s  for Afib on 10/20/2012.    Reviewed patients medication list.  Pt is not  currently on any combined P-gp and strong CYP3A4 inhibitors/inducers (ketoconazole, traconazole, ritonavir, carbamazepine, phenytoin, rifampin, St. John's wort).  Reviewed labs.  SCr 1.3 Weight 100Kg. , CrCl- 79.   Dose appropriate  based on CrCl.   Hgb and HCT 15.2/45.2.  A full discussion of the nature of anticoagulants has been carried out.  A benefit/risk analysis has been presented to the patient, so that they understand the justification for choosing anticoagulation with Xarelto at this time.  The need for compliance is stressed.  Pt is aware to take the medication once daily with the largest meal of the day.  Side effects of potential bleeding are discussed, including unusual colored urine or stools, coughing up blood or coffee ground emesis, nose bleeds or serious fall or head trauma.  Discussed signs and symptoms of stroke. The patient should avoid any OTC items containing aspirin or ibuprofen.  Avoid alcohol consumption.   Call if any signs of abnormal bleeding.  Discussed financial obligations and resolved any difficulty in obtaining medication.  Next lab test test in 6 months.

## 2013-08-15 ENCOUNTER — Other Ambulatory Visit: Payer: Self-pay | Admitting: Cardiology

## 2013-09-12 ENCOUNTER — Other Ambulatory Visit: Payer: Self-pay | Admitting: Cardiology

## 2013-11-15 ENCOUNTER — Other Ambulatory Visit: Payer: Self-pay | Admitting: *Deleted

## 2013-11-15 DIAGNOSIS — I7121 Aneurysm of the ascending aorta, without rupture: Secondary | ICD-10-CM

## 2013-11-15 DIAGNOSIS — I719 Aortic aneurysm of unspecified site, without rupture: Secondary | ICD-10-CM

## 2013-12-23 ENCOUNTER — Other Ambulatory Visit (INDEPENDENT_AMBULATORY_CARE_PROVIDER_SITE_OTHER): Payer: 59 | Admitting: *Deleted

## 2013-12-23 DIAGNOSIS — I480 Paroxysmal atrial fibrillation: Secondary | ICD-10-CM

## 2013-12-23 LAB — CBC
HCT: 44 % (ref 39.0–52.0)
Hemoglobin: 14.2 g/dL (ref 13.0–17.0)
MCHC: 32.4 g/dL (ref 30.0–36.0)
MCV: 93.4 fl (ref 78.0–100.0)
Platelets: 256 10*3/uL (ref 150.0–400.0)
RBC: 4.71 Mil/uL (ref 4.22–5.81)
RDW: 13.2 % (ref 11.5–15.5)
WBC: 10.1 10*3/uL (ref 4.0–10.5)

## 2013-12-23 LAB — BASIC METABOLIC PANEL
BUN: 22 mg/dL (ref 6–23)
CO2: 26 mEq/L (ref 19–32)
Calcium: 9.3 mg/dL (ref 8.4–10.5)
Chloride: 102 mEq/L (ref 96–112)
Creatinine, Ser: 1.1 mg/dL (ref 0.4–1.5)
GFR: 73.17 mL/min (ref >60.00)
Glucose, Bld: 99 mg/dL (ref 70–99)
Potassium: 3.9 mEq/L (ref 3.5–5.1)
Sodium: 137 mEq/L (ref 135–145)

## 2014-01-04 LAB — BUN: BUN: 27 mg/dL — ABNORMAL HIGH (ref 6–23)

## 2014-01-04 LAB — CREATININE, SERUM: Creat: 1.2 mg/dL (ref 0.50–1.35)

## 2014-01-06 ENCOUNTER — Encounter: Payer: Self-pay | Admitting: Cardiothoracic Surgery

## 2014-01-06 ENCOUNTER — Ambulatory Visit (INDEPENDENT_AMBULATORY_CARE_PROVIDER_SITE_OTHER): Payer: 59 | Admitting: Cardiothoracic Surgery

## 2014-01-06 ENCOUNTER — Ambulatory Visit
Admission: RE | Admit: 2014-01-06 | Discharge: 2014-01-06 | Disposition: A | Discharge status: Home / Self Care | Payer: 59 | Source: Ambulatory Visit | Attending: Cardiothoracic Surgery | Admitting: Cardiothoracic Surgery

## 2014-01-06 VITALS — BP 133/82 | HR 94 | Resp 20 | Ht 70.0 in | Wt 212.0 lb

## 2014-01-06 DIAGNOSIS — I712 Thoracic aortic aneurysm, without rupture: Secondary | ICD-10-CM

## 2014-01-06 DIAGNOSIS — I7121 Aneurysm of the ascending aorta, without rupture: Secondary | ICD-10-CM

## 2014-01-06 DIAGNOSIS — I719 Aortic aneurysm of unspecified site, without rupture: Secondary | ICD-10-CM

## 2014-01-06 MED ORDER — IOHEXOL 350 MG/ML SOLN
80.0000 mL | Freq: Once | INTRAVENOUS | Status: AC | PRN
  Administered 2014-01-06: 80 mL via INTRAVENOUS

## 2014-01-06 NOTE — Progress Notes (Signed)
[address]       [place] [address]             [phone]                        Erick E Adelson Snelling Medical Record #[id number] Date of Birth: February 13, 1945  Referring: [name], MD Primary Care: Dr Nona Dell Cardiology : Dr Ottie Glazier  Chief Complaint:    Chief Complaint  Patient presents with  . Thoracic Aortic Aneurysm    8 month f/u with CTA Chest    History of Present Illness:    Patient is a 68 year old male referred because of dilated ascending aorta noted on CT scan done at [facility] May 11 2012. The patient notes that in May he was referred to [facility] for cardiology clearance prior to EAR surgery. He notes that Dr. Jimmie Molly was doing a stress echo on him but never completed it because  a dilated aortic root , moderate aortic regurgitation and  mild to moderate mitral regurgitation was noted . A CT scan of the chest was performed following the echocardiogram 05/11/2012. He notes that he know his aorta was large for "years".   The patient also he has been in atrial fibrillation for several years and has a history of at least 2 pulmonary emboli. At the first time I saw him he was referred to Dr. Nelson/cardiology and because of both atrial fibrillation and history of pulmonary emboli and was not on anticoagulation . He was started on xarelto. He continues on Xarelto without bleeding difficulty  He denies anginal or exertional chest, is not limited by shortness of breath. He does have known sleep apnea and uses BiPAP at night. He does note some fatigue  He has had no previous history of myocardial infarction, but does have a brother who had a myocardial infarction at age 22. There is no family history of Marfan's or acute aortic dissections.      Current Activity/ Functional Status:  Patient is independent with mobility/ambulation, transfers, ADL's, IADL's.  Zubrod Score: At the time of surgery this patient's  most appropriate activity status/level should be described as: []  Normal activity, no symptoms [x]  Symptoms, fully ambulatory []  Symptoms, in bed less than or equal to 50% of the time []  Symptoms, in bed greater than 50% of the time but less than 100% []  Bedridden []  Moribund   Past Medical History  Diagnosis Date  . Hypertension   . Sleep apnea     CPAP  . BPH (benign prostatic hyperplasia)   . Pulmonary embolism   . Atrial fibrillation   . Hyperlipidemia   . Obesity   . Rhinitis   . Aortic regurgitation   . Aortic root aneurysm     Past Surgical History  Procedure Laterality Date  . Appendectomy    . Tonsillectomy    . Hernia repair      Family History  Problem Relation Age of Onset  . Stroke Mother   . Stroke Father   . Alzheimer's disease Mother   . Heart attack Mother   . Diabetes Mellitus II Father    No family history of Marfan's or aortic dissection.   History  Smoking status  . Never Smoker   Smokeless tobacco  . Never Used    History  Alcohol Use No     Allergies  Allergen Reactions  . Statins Other (  See Comments)    MYALGIAS    Current Outpatient Prescriptions  Medication Sig Dispense Refill  . b complex vitamins tablet Take 1 tablet by mouth daily.    . Chelated Zinc 50 MG TABS Take 1 tablet by mouth daily.    . finasteride (PROSCAR) 5 MG tablet Take 5 mg by mouth daily.    . fish oil-omega-3 fatty acids 1000 MG capsule Take 1,200 g by mouth daily.    . folic acid (FOLVITE) 277 MCG tablet Take 800 mcg by mouth daily.    . hydrochlorothiazide (HYDRODIURIL) 25 MG tablet Take 25 mg by mouth daily.    . Inositol Niacinate (NO FLUSH NIACIN) 100 MG TABS Take 1 tablet by mouth 2 (two) times daily.    . meloxicam (MOBIC) 7.5 MG tablet Take 7.5 mg by mouth daily.    . metoprolol tartrate (LOPRESSOR) 25 MG tablet Take 25 mg by mouth daily.    . Multiple Vitamins-Minerals (MENS MULTIVITAMIN PLUS PO) Take 1 tablet by mouth daily.    . Nettle,  Urtica Dioica, (NETTLE LEAF PO) Take 10 mg by mouth daily.    . Potassium 99 MG TABS Take 1 tablet by mouth daily.    . ranitidine (ZANTAC) 75 MG tablet Take 75 mg by mouth daily.    . Red Yeast Rice Extract (RED YEAST RICE PO) Take 800 mg by mouth 2 (two) times daily.    . tamsulosin (FLOMAX) 0.4 MG CAPS capsule Take 0.4 mg by mouth daily after supper.    Marland Kitchen UNABLE TO FIND Take 2 tablets by mouth daily. Med Name:Curamin    . XARELTO 20 MG TABS tablet TAKE ONE TABLET BY MOUTH ONCE DAILY WITH SUPPER 30 tablet 10   No current facility-administered medications for this visit.       Review of Systems:     Cardiac Review of Systems: Y or N  Chest Pain [  n  ]  Resting SOB [  n ] Exertional SOB  [ y ]  Vertell Limber Florencio.Farrier  ]   Pedal Edema Florencio.Farrier   ]    Palpitations Blue.Reese  ] Syncope  Florencio.Farrier  ]   Presyncope Florencio.Farrier   ]  General Review of Systems: [Y] = yes [  ]=no Constitional: recent weight change [n  ]; anorexia [  ]; fatigue [ y ]; nausea [  ]; night sweats [n  ]; fever [  n]; or chills Florencio.Farrier  ];                                                                                                                                          Dental: poor dentition[ n ]; Last Dentist visit: reguler dental visits q 6 months  Eye : blurred vision [  ]; diplopia [   ]; vision changes [  ];  Amaurosis fugax[  ]; Resp: cough [ n ];  wheezing[ n ];  hemoptysis[n  ]; shortness of breath[  ]; paroxysmal nocturnal dyspnea[  ]; dyspnea on exertion[  ]; or orthopnea[  ];  GI:  gallstones[n  ], vomiting[  ];  dysphagia[  ]; melena[n  ];  hematochezia [ n ]; heartburn[ n ];   Hx of  Colonoscopy[ y 8 years ago "clean" ]; GU: kidney stones [n  ]; hematuria[n  ];   dysuria Blue.Reese  ];  nocturia[  ];  history of     obstruction [  y]; urinary frequency Blue.Reese  ]             Skin: rash, swelling[  ];, hair loss[  ];  peripheral edema[  ];  or itching[  ]; Musculosketetal: myalgias[  ];  joint swelling[  ];  joint erythema[  ];  joint pain[  ];  back pain[   ];  Heme/Lymph: bruising[  ];  bleeding[  ];  anemia[  ];  Neuro: TIA[n  ];  headaches[n  ];  stroke[n  ];  vertigo[  ];  seizures[ n ];   paresthesias[ n ];  difficulty walking[ n ];  Psych:depression[  ]; anxiety[  ];  Endocrine: diabetes[  ];  thyroid dysfunction[  ];  Immunizations: Flu [ refused  ]; Pneumococcal[refused  ];  Other:  Physical Exam: BP 133/82 mmHg  Pulse 94  Resp 20  Ht 5\' 10"  (1.778 m)  Wt 212 lb (96.163 kg)  BMI 30.42 kg/m2  SpO2 97%  Physical Examination: General appearance - alert, well appearing, and in no distress, oriented to person, place, and time and overweight Mental status - alert, oriented to person, place, and time, normal mood, behavior, speech, dress, motor activity, and thought processes Eyes - pupils equal and reactive, extraocular eye movements intact Ears - bilateral TM's and external ear canals normal, not examined Nose - normal and patent, no erythema, discharge or polyps Neck - supple, no significant adenopathy, carotids upstroke normal bilaterally, no bruits, thyroid exam: thyroid is normal in size without nodules or tenderness Chest - clear to auscultation, no wheezes, rales or rhonchi, symmetric air entry, no tachypnea, retractions or cyanosis Heart - normal rate, regular rhythm, normal S1, S2, no murmurs, rubs, clicks or gallops, diastolic murmur 2/6 at lower left sternal border, no JVD Abdomen - soft, nontender, nondistended, no masses or organomegaly no abdominal bruits no pulsatile masses no CVA tenderness no hernias noted Neurological - alert, oriented, normal speech, no focal findings or movement disorder noted, screening mental status exam normal, cranial nerves II through XII intact Musculoskeletal - no joint tenderness, deformity or swelling, no muscular tenderness noted, full range of motion without pain Extremities - peripheral pulses normal, no pedal edema, no clubbing or cyanosis, intact peripheral pulses, feet normal, good  pulses, normal color, temperature and sensation, monofilament sensory exam is normal in both feet Skin - normal coloration and turgor, no rashes, no suspicious skin lesions noted On physical exam no stigmata of Marfan's  Diagnostic Studies & Laboratory data:     Recent Radiology Findings: Ct Angio Chest Aorta W/cm &/or Wo/cm  01/06/2014   CLINICAL DATA:  Followup ascending thoracic aortic aneurysm.  EXAM: CT ANGIOGRAPHY CHEST WITH CONTRAST  TECHNIQUE: Multidetector CT imaging of the chest was performed using the standard protocol during bolus administration of intravenous contrast. Multiplanar CT image reconstructions and MIPs were obtained to evaluate the vascular anatomy.  CONTRAST:  48mL OMNIPAQUE IOHEXOL 350 MG/ML SOLN  COMPARISON:  05/06/2013.  FINDINGS: The maximum  aortic diameter at the sinus of Valsalva, sino-tubular junction and ascending thoracic aorta are 4.0 cm, 4.6 cm and 4.4 cm respectively. These were previously 4.5 cm, 4.9 cm and 4.6 cm respectively, measured at the same levels.  The degree of dilatation of the left atrium is unchanged. Minimal residual atelectasis or scarring at both lung bases. No lung nodules or enlarged lymph nodes. Stable mild narrowing of the origin of the celiac axis. Mild thoracic spine degenerative changes.  Review of the MIP images confirms the above findings.  IMPRESSION: Interval mild decrease in size of the previously demonstrated dilated aortic root, sino-tubular junction and ascending thoracic aorta. The maximum diameter today is 4.6 cm at the sino-tubular junction.   Electronically Signed   By: Enrique Sack M.D.   On: 01/06/2014 11:56    ECHO:  Patient: Quenton, Recendez MR #: [id number] Study Date: 10/15/2012 Gender: M Age: 19 Height: 175.3cm Weight: 101.6kg BSA: 2.37m^2 Pt. Status: Room:  ATTENDING Jimmy Picket SONOGRAPHER Cindy Hazy, RDCS PERFORMING [facility], Site  3 cc:  ------------------------------------------------------------ LV EF: 50% - 55%  ------------------------------------------------------------ Indications: Atrial fibrillation - 427.31.  ------------------------------------------------------------ History: PMH: Acquired from the patient and from the patient's chart. PMH: Atrial Fibrillation. History of Pulmonary Embolism. Obstructive Sleep Apnea-CPAP. Dilated Aortic Root. Risk factors: Hypertension. Obese. Dyslipidemia.  ------------------------------------------------------------ Study Conclusions  - Left ventricle: The cavity size was mildly dilated. Wall thickness was increased in a pattern of mild LVH. Systolic function was normal. The estimated ejection fraction was in the range of 50% to 55%. - Aortic valve: Mild regurgitation. - Aorta: The aorta was moderately dilated. - Left atrium: The atrium was moderately dilated. Echocardiography. M-mode, complete 2D, spectral Doppler, and color Doppler. Height: Height: 175.3cm. Height: 69in. Weight: Weight: 101.6kg. Weight: 223.5lb. Body mass index: BMI: 33.1kg/m^2. Body surface area: BSA: 2.53m^2. Blood pressure: 132/84. Patient status: Outpatient. Location: [facility]  ------------------------------------------------------------  ------------------------------------------------------------ Left ventricle: The cavity size was mildly dilated. Wall thickness was increased in a pattern of mild LVH. Systolic function was normal. The estimated ejection fraction was in the range of 50% to 55%.  ------------------------------------------------------------ Aortic valve: Structurally normal valve. Cusp separation was normal. Doppler: Transvalvular velocity was within the normal range. There was no stenosis. Mild regurgitation.  ------------------------------------------------------------ Aorta: The aorta was moderately dilated. Ascending aorta: The ascending aorta was  mildly dilated.  ------------------------------------------------------------ Mitral valve: Doppler: Peak gradient: 40mm Hg (D).  ------------------------------------------------------------ Left atrium: The atrium was moderately dilated.  ------------------------------------------------------------ Right ventricle: The cavity size was normal. Systolic function was normal.  ------------------------------------------------------------ Tricuspid valve: Structurally normal valve. Leaflet separation was normal. Doppler: Transvalvular velocity was within the normal range. Trivial regurgitation.  ------------------------------------------------------------ Pulmonary artery: Systolic pressure was within the normal range.  ------------------------------------------------------------ Right atrium: The atrium was normal in size.  ------------------------------------------------------------ Pericardium: There was no pericardial effusion.  ------------------------------------------------------------ Systemic veins: Inferior vena cava: The vessel was normal in size; the respirophasic diameter changes were in the normal range (= 50%); findings are consistent with normal central venous pressure.  ------------------------------------------------------------ Post procedure conclusions Ascending Aorta:  - The aorta was moderately dilated.  ------------------------------------------------------------  2D measurements Normal Doppler measurements Normal Left ventricle Main pulmonary LVID ED, 57.6 mm 43-52 artery chord, Pressure, 25 mm Hg =30 PLAX S LVID ES, 40.2 mm 23-38 Left ventricle chord, Ea, lat 14.6 cm/s ------ PLAX ann, tiss FS, chord, 30 % >29 DP PLAX E/Ea, lat 7.33 ------ LVPW, ED 11.9 mm ------ ann, tiss  IVS/LVPW 1.08 <1.3 DP ratio, ED Ea, med 8.88 cm/s ------ Ventricular septum ann, tiss IVS, ED 12.9 mm ------ DP LVOT E/Ea, med 12.05 ------ Diam, S 24 mm ------ ann,  tiss Area 4.52 cm^2 ------ DP Diam 24 mm ------ LVOT Aorta Peak vel, 66.6 cm/s ------ Root diam, 46 mm ------ S ED VTI, S 15.8 cm ------ AAo AP 48 mm ------ Stroke vol 71.5 ml ------ diam, S Stroke 32.9 ml/m^ ------ Left atrium index 2 AP dim 53 mm ------ Aortic valve AP dim 2.44 cm/m^2 <2.2 Regurg PHT 643 ms ------ index Mitral valve Peak E vel 107 cm/s ------ Decelerati 204 ms 150-23 on time 0 Peak 5 mm Hg ------ gradient, D Tricuspid valve Regurg 225 cm/s ------ peak vel Peak RV-RA 20 mm Hg ------ gradient, S Systemic veins Estimated 5 mm Hg ------ CVP Right ventricle Pressure, 25 mm Hg <30 S Sa vel, 10.1 cm/s ------ lat ann, tiss DP  ------------------------------------------------------------ Prepared and Electronically Authenticated by  Mertie Moores 2014-10-09T17:45:42.117          Recent Lab Findings: Lab Results  Component Value Date   WBC 10.1 12/23/2013   Ct Angio Chest Aorta W/cm &/or Wo/cm  05/06/2013   CLINICAL DATA:  Aortic root evaluation  EXAM: CT ANGIOGRAPHY CHEST WITH CONTRAST  TECHNIQUE: Multidetector CT imaging of the chest was performed using the standard protocol during bolus administration of intravenous contrast. Multiplanar CT image reconstructions and MIPs were obtained to evaluate the vascular anatomy.  CONTRAST:  80mL OMNIPAQUE IOHEXOL 350 MG/ML SOLN  COMPARISON:  None.  FINDINGS: Maximal aortic diameter is at the sinus of Valsalva, sino-tubular junction, and ascending aorta are 4.5 cm, 4.9 cm, and 4.6 cm respectively. Mild coronary artery calcifications in the circumflex and LAD coronary arteries. Minimal right coronary artery calcifications.  Great vessels are patent. Vertebral arteries are patent within the confines of the exam.  There is mild dilatation of the left atrium. Very minimal peripheral aortic valve calcifications.  Limited images of the abdomen demonstrate mild narrowing at the origin of the celiac axis and patency at the  origin of the SMA.  No abnormal mediastinal adenopathy.  No mediastinal mass effect.  Bibasilar atelectasis.  No pneumothorax or pleural effusion. Chronic right upper rib deformities. No definite acute bony deformity. Posterior disc osteophytes at C6-7 with severe disc space narrowing.  Review of the MIP images confirms the above findings.  IMPRESSION: There is significant dilatation of the aortic root as described. The sino-tubular junction is 4.9 cm in diameter. No evidence of dissection. Minimal peripheral aortic valvular calcifications.   Electronically Signed   By: Maryclare Bean M.D.   On: 05/06/2013 16:21   05/11/2012 CT of Chest: CT ANGIOGRAPHY CHEST Comparison: CT scan of June 20, 2008. Findings: No significant abnormality seen involving the pulmonary parenchyma. No pleural effusions or pneumothorax are noted. Normal contrast filling of pulmonary arteries is noted. There is no evidence of aortic dissection. Coronary artery calcifications are noted consistent with coronary artery disease. There is mild dilatation of the ascending thoracic aorta is again noted which now measures 5.0 cm which is slightly increased compared to prior exam of 2010. No mass or adenopathy is noted in the mediastinum. Visualized portions of upper abdomen are within normal limits.  IMPRESSION: No evidence of thoracic aortic dissection is noted. Coronary artery calcifications are noted suggesting coronary artery disease. Aneurysmal dilatation of the ascending thoracic aorta is noted with maximum measured diameter of 5.0 cm which has increased compared  to prior exam of 2010. Original Report Authenticated By: Marijo Conception., M.D.    02/2006 CT of Chest Satisfactory contrast-opacification of pulmonary vasculature. Right upper lobe evidence for pulmonary thromboembolism. Ascending aorta measures 4.4 cm transverse by 4.7 cm AP. Descending aorta measures 3.2 cm transverse by 3.1 cm AP. No acute pulmonary  infiltrate. No pleural effusion. No adenopathy. Normal cardiac size. 11/02/2007 CT of Chest: Findings: The chest wall is stable. No supraclavicular or axillary adenopathy. The thyroid gland appears normal.  The heart is borderline enlarged but stable. No pericardial effusion. Stable small scattered mediastinal and hilar lymph nodes.  No change and fusiform aneurysmal dilatation of the ascending aorta with maximal measurements of 4.7 x 4.7 cm. No dissection. The major branch vessels are patent. The esophagus is grossly normal. There is a filling defect and a right lower lobe pulmonary artery consistent with pulmonary embolism. There is tortuosity of the descending thoracic aorta.  Examination of the lung parenchyma demonstrates minimal lingular scarring change. The airspace consolidation has resolved and this may be post pneumonic scarring. No infiltrates, edema or effusions. No interstitial lung disease. No pulmonary masses or nodules.  The upper abdomen is stable. There is a small left adrenal gland nodule which is likely a benign adenoma.  IMPRESSION:  1. Incidental pulmonary embolism is noted in a right lower lobe pulmonary artery. 2. Near complete resolution of left lingular process. There is minimal residual probable post pneumonic scarring changes. No acute pulmonary findings or pulmonary masses. 3. Stable ascending thoracic aortic aneurysm. 4. Borderline cardiac size, stable. 5. Stable small mediastinal and hilar lymph nodes. 6. Stable left adrenal gland nodule, likely benign adenoma. 7. Stable periportal and celiac axis lymph nodes.  06/20/2008 CT of Chest: Comparison: CT chest without and with contrast 11/02/2007. Findings: Measured in the axial plane at a level approximately 2.5 cm above the aortic valve on series 4, image 30, maximum diameter of the ascending thoracic aorta remains unchanged at 4.7 cm. Tortuous aortic arch and proximal great vessels without  significant atherosclerosis. Mild descending thoracic and upper abdominal aortic atherosclerosis. No evidence of aortic dissection. Heart mildly enlarged but stable. No visible coronary artery calcification. No pericardial effusion. Interval resolution of the segmental right lower lobe of pulmonary embolus; no central pulmonary emboli currently.  Stable scattered normal-sized mediastinal and hilar lymph nodes; no new, enlarging, or suspicious lymphadenopathy. Normal thyroid gland. Minimal scarring in the lingula and in the lower lobes. Lungs otherwise clear without localized airspace consolidation, interstitial disease, or nodularity. High resolution expiratory images demonstrate scattered areas of hyperlucency consistent with localized air trapping. Stable approximate 1.1 cm left adrenal adenoma. Normal-sized porta hepatis and celiac axis lymph nodes again noted and unchanged. Remaining visualized upper abdomen unremarkable for the early arterial phase of enhancement. Bone window images again demonstrate mild thoracic spondylosis.  IMPRESSION: Since the prior CT chest 11/02/2007: 1. Stable ascending thoracic aortic aneurysm with maximum diameter approximately 4.7 cm. 2. Resolution of the right lower lobe segmental pulmonary embolus. No central pulmonary emboli currently. 3. Stable mild cardiomegaly. No acute cardiopulmonary disease. 4. Scattered areas of hyperlucency on the expiratory high resolution images consistent with localized areas of air trapping as can be seen in asthma and/or bronchitis. 5. Stable 1.1 cm left adrenal adenoma. IMPRESSION: Right upper lobe filling defect consistent with nonocclusive pulmonary thromboembolism. Ascending aortic enlargement measuring 4.4 cm transverse by 4.7 cm AP. No significant consolidation or effusion identified.  PFT's: FEV1 2.08  61% +10% post treatment  DLCO 115 %  Assessment / Plan:     1)  Dilated ascending aorta, chronic  4.7 cm in 2008 now 4.6  cm today  (6 months ago),                    Aortic Size Index=    4.6      /Body surface area is 2.27 meters squared. = 2.02                                                     < 2.75 cm/m2      4% risk per year                                                       2.75 to 4.25          8% risk per year                                                      > 4.25 cm/m2    20% risk per year                                                       cross sectional area of aorta cm2/height in meters >                                                       19.63/1.778 = 9.75  #2 valvular heart disease by echo report: Currently the degree of aortic insufficiency is only mild, there also mild left ventricular dilatation (LVEDD = 57 cm).  #3 chronic atrial fibrillation: now on anticoagulation #4 extensive coronary artery calcification on CT scan, positive family history of early coronary artery disease, no previous history of ischemic events, no current symptoms #5 history of pulmonary emboli x2, no history of workup for hypercoagulable state #6 obstructive sleep apnea/Moderaltly Severe Airflow limitation #7 hyperlipidemia  #8 benign prostatic hypertrophy #9 history of hypertension, he is currently on a beta blocker  I have reviewed with him the most recent CT scan and recommended that we repeat surveillance of the size of his aorta with a CT of the chest in one year. He has a follow-up appointment with Dr. Meda Coffee in the spring of this year.   Grace Isaac MD      [address] [place] [address] Office [phone]   Beeper [phone]  01/06/2014 1:13 PM

## 2014-08-04 ENCOUNTER — Other Ambulatory Visit: Payer: Self-pay | Admitting: Cardiology

## 2014-08-15 ENCOUNTER — Encounter: Payer: Self-pay | Admitting: *Deleted

## 2014-08-26 ENCOUNTER — Ambulatory Visit: Payer: Self-pay | Admitting: Cardiology

## 2014-08-26 ENCOUNTER — Other Ambulatory Visit (HOSPITAL_COMMUNITY): Payer: Self-pay

## 2014-10-03 NOTE — Addendum Note (Signed)
Encounter addended by: Dorothy Spark, MD on: 10/03/2014  9:49 AM<BR>     Documentation filed: Orders

## 2014-10-06 ENCOUNTER — Ambulatory Visit (INDEPENDENT_AMBULATORY_CARE_PROVIDER_SITE_OTHER): Payer: PPO | Admitting: Cardiology

## 2014-10-06 ENCOUNTER — Ambulatory Visit (HOSPITAL_COMMUNITY): Payer: PPO | Attending: Cardiology

## 2014-10-06 ENCOUNTER — Encounter: Payer: Self-pay | Admitting: Cardiology

## 2014-10-06 VITALS — BP 126/74 | HR 69 | Ht 70.0 in | Wt 224.0 lb

## 2014-10-06 DIAGNOSIS — I719 Aortic aneurysm of unspecified site, without rupture: Secondary | ICD-10-CM | POA: Diagnosis not present

## 2014-10-06 DIAGNOSIS — I517 Cardiomegaly: Secondary | ICD-10-CM | POA: Insufficient documentation

## 2014-10-06 DIAGNOSIS — I4891 Unspecified atrial fibrillation: Secondary | ICD-10-CM | POA: Insufficient documentation

## 2014-10-06 DIAGNOSIS — I7121 Aneurysm of the ascending aorta, without rupture: Secondary | ICD-10-CM

## 2014-10-06 DIAGNOSIS — I351 Nonrheumatic aortic (valve) insufficiency: Secondary | ICD-10-CM | POA: Insufficient documentation

## 2014-10-06 DIAGNOSIS — I77810 Thoracic aortic ectasia: Secondary | ICD-10-CM | POA: Insufficient documentation

## 2014-10-06 DIAGNOSIS — I10 Essential (primary) hypertension: Secondary | ICD-10-CM

## 2014-10-06 DIAGNOSIS — I071 Rheumatic tricuspid insufficiency: Secondary | ICD-10-CM | POA: Diagnosis not present

## 2014-10-06 DIAGNOSIS — I340 Nonrheumatic mitral (valve) insufficiency: Secondary | ICD-10-CM | POA: Insufficient documentation

## 2014-10-06 NOTE — Progress Notes (Signed)
Patient ID: Aaron Richmond, male   DOB: 1945-12-25, 69 y.o.   MRN: [id number]    Patient Name: Aaron Richmond Date of Encounter: 10/06/2014  Primary Care Provider:  PROVIDER NOT IN SYSTEM Primary Cardiologist:  Dorothy Spark  Patient Profile  Atrial fibrillation  Problem List   Past Medical History  Diagnosis Date  . Hypertension   . Sleep apnea     CPAP  . BPH (benign prostatic hyperplasia)   . Pulmonary embolism   . Atrial fibrillation   . Hyperlipidemia   . Obesity   . Rhinitis   . Aortic regurgitation   . Aortic root aneurysm    Past Surgical History  Procedure Laterality Date  . Appendectomy    . Tonsillectomy    . Hernia repair      Allergies  Allergies  Allergen Reactions  . Statins Other (See Comments)    MYALGIAS    HPI  69 year old with h/o hypertension and hyperlipidemia with known dilated ascending aorta noted on CT in 2008 measured at 4.7 cm and 5 cm in May 2014. The patient was referred to [facility] for preop visit prior to the ear surgery. He was scheduled for a stress echo that was not performed as there was a concern about dilated root and use of Dobutamine.  The patient also states that he has been in atrial fibrillation for a while (unsure for how long). He has never been on anticoagulation for it. He has history of two pulmonary emboli, both after a long plane flight. He was using Coumadin for 6 weeks each time.  The patient states that he is quite active, he is employed and he spends his weekends working in the back yard. He has never felt limited by shortness of breath. He has never had chest pain No palpitations and no syncope. He does have known sleep apnea and uses BiPAP at night.   Patient's brother had a myocardial infarction at age 41.   The patient is coming after 1 year, he is completely asymptomatic and denies any shortness of breath, chest pain, palpitations or syncope. He had repeat echocardiogram with stable  size of his aortic root and ascending aortic aneurysm.  Home Medications  Prior to Admission medications   Medication Sig Start Date End Date Taking? Authorizing Provider  aspirin 81 MG tablet Take 81 mg by mouth daily. 2 tablets daily   Yes Historical Provider, MD  b complex vitamins tablet Take 1 tablet by mouth daily.   Yes Historical Provider, MD  Chelated Zinc 50 MG TABS Take 1 tablet by mouth daily.   Yes Historical Provider, MD  finasteride (PROSCAR) 5 MG tablet Take 5 mg by mouth daily.   Yes Historical Provider, MD  fish oil-omega-3 fatty acids 1000 MG capsule Take 1,200 g by mouth daily.   Yes Historical Provider, MD  folic acid (FOLVITE) 607 MCG tablet Take 800 mcg by mouth daily.   Yes Historical Provider, MD  hydrochlorothiazide (HYDRODIURIL) 25 MG tablet Take 25 mg by mouth daily.   Yes Historical Provider, MD  Inositol Niacinate (NO FLUSH NIACIN) 100 MG TABS Take 1 tablet by mouth 2 (two) times daily.   Yes Historical Provider, MD  meloxicam (MOBIC) 7.5 MG tablet Take 7.5 mg by mouth daily.   Yes Historical Provider, MD  metoprolol tartrate (LOPRESSOR) 25 MG tablet Take 25 mg by mouth daily.   Yes Historical Provider, MD  Multiple Vitamins-Minerals (MENS MULTIVITAMIN PLUS PO) Take 1  tablet by mouth daily.   Yes Historical Provider, MD  Nettle, Urtica Dioica, (NETTLE LEAF PO) Take 10 mg by mouth daily.   Yes Historical Provider, MD  Potassium 99 MG TABS Take 1 tablet by mouth daily.   Yes Historical Provider, MD  ranitidine (ZANTAC) 75 MG tablet Take 75 mg by mouth daily.   Yes Historical Provider, MD  Red Yeast Rice Extract (RED YEAST RICE PO) Take 800 mg by mouth 2 (two) times daily.   Yes Historical Provider, MD  tamsulosin (FLOMAX) 0.4 MG CAPS capsule Take 0.4 mg by mouth daily after supper.   Yes Historical Provider, MD  UNABLE TO FIND Take 2 tablets by mouth daily. Med Name:Curamin   Yes Historical Provider, MD    Family History  Family History  Problem Relation Age of  Onset  . Stroke Mother   . Stroke Father   . Alzheimer's disease Mother   . Heart attack Mother   . Diabetes Mellitus II Father     Social History  Social History   Social History  . Marital Status: Married    Spouse Name: N/A  . Number of Children: 0  . Years of Education: N/A   Occupational History  . Engineering geologist    Social History Main Topics  . Smoking status: Never Smoker   . Smokeless tobacco: Never Used  . Alcohol Use: No  . Drug Use: No  . Sexual Activity: Not on file   Other Topics Concern  . Not on file   Social History Narrative     Review of Systems General:  No chills, fever, night sweats or weight changes.  Cardiovascular:  No chest pain, dyspnea on exertion, edema, orthopnea, palpitations, paroxysmal nocturnal dyspnea. Dermatological: No rash, lesions/masses Respiratory: No cough, dyspnea Urologic: No hematuria, dysuria Abdominal:   No nausea, vomiting, diarrhea, bright red blood per rectum, melena, or hematemesis Neurologic:  No visual changes, wkns, changes in mental status. All other systems reviewed and are otherwise negative except as noted above.  Physical Exam  Blood pressure 126/74, pulse 69, height 5\' 10"  (1.778 m), weight 224 lb (101.606 kg).  General: Pleasant, NAD Psych: Normal affect. Neuro: Alert and oriented X 3. Moves all extremities spontaneously. HEENT: Normal  Neck: Supple without bruits or JVD. Lungs:  Resp regular and unlabored, CTA. Heart: RRR no s3, s4, or murmurs. Abdomen: Soft, non-tender, non-distended, BS + x 4.  Extremities: No clubbing, cyanosis or edema. DP/PT/Radials 2+ and equal bilaterally.  Accessory Clinical Findings  ECG - A fib rate controlled 82 BPM, non-specific ST changes  TTE: 10/15/2012 Study Conclusions  - Left ventricle: The cavity size was mildly dilated. Wall thickness was increased in a pattern of mild LVH. Systolic function was normal. The estimated ejection fraction was in the range  of 50% to 55%. - Aortic valve: Mild regurgitation. - Aorta: The aorta was moderately dilated. - Left atrium: The atrium was moderately dilated.  TTE: 09/2014 Study Conclusions  - Left ventricle: The cavity size was mildly dilated. There was mild concentric hypertrophy. Systolic function was mildly reduced. The estimated ejection fraction was in the range of 45% to 50%. Wall motion was normal; there were no regional wall motion abnormalities. - Aortic valve: There was mild to moderate regurgitation. - Aorta: Aortic root dimension: 39 mm (ED). Ascending aortic diameter: 44 mm (S). - Aortic root: The aortic root was mildly dilated. - Ascending aorta: The ascending aorta was moderately dilated; it was unchanged fromthe study of 06/24/2013. -  Mitral valve: There was mild regurgitation. - Left atrium: The atrium was severely dilated. - Right ventricle: The cavity size was mildly dilated. Wall thickness was normal. Systolic function was normal. - Right atrium: The atrium was mildly dilated. - Tricuspid valve: There was trivial regurgitation. - Pulmonary arteries: Systolic pressure was mildly increased. PA peak pressure: 31 mm Hg (S). - Inferior vena cava: The vessel was dilated. The respirophasic diameter changes were blunted (< 50%).   Assessment & Plan  Assessment / Plan:  1) Dilated ascending aorta, chronic 4.7 cm in 2008, stable in April 2015. Dr [name] evaluated the patient, wants to follow closely and operate when ascending aorta reaches 5.5 cm unless there is another indication. Currently the degree of aortic insufficiency is only mild, there also mild left ventricular dilatation (LVEDD = 57 cm).   Today's echo shows mild LVEF dysfunction 45-50%, LVEDD 59 mm, AI mild to moderate (mild a year ago), Stable acending aortic size, we will repeat echo in 1 year.   2. CAD - The patient has extensive coronary artery calcification on CT scan, positive family history  of early coronary artery disease. However, the patient is acting completely asymptomatic, he has no signs of angina or heart failure and doesn't require any stress test.  3. Chronic atrial fibrillation on aspirin (assuming nonvalvular afib chad2 =1)  His CHA2DS2-VASc score is 2. However, the history of at least 2 prior pulmonary embolisms places him at the higher risk of thromboembolic event. The patient was started on Xarelto that he's tolerating very well.  4. Hyperlipidemia - statin intolerance, but controlled on niacin, red yeast rice and diet  5. Hypertension - controlled on Metoprolol  Follow up in 1 year.  Dorothy Spark, MD 10/06/2014, 8:58 AM

## 2014-10-06 NOTE — Patient Instructions (Signed)
Medication Instructions:   Your physician recommends that you continue on your current medications as directed. Please refer to the Current Medication list given to you today.     Testing/Procedures:  Your physician has requested that you have an echocardiogram. Echocardiography is a painless test that uses sound waves to create images of your heart. It provides your doctor with information about the size and shape of your heart and how well your heart's chambers and valves are working. This procedure takes approximately one hour. There are no restrictions for this procedure.  DR Meda Coffee WOULD LIKE THIS SCHEDULED PRIOR TO YOUR ONE YEAR FOLLOW-UP APPOINTMENT WITH HER    Follow-Up:  Your physician wants you to follow-up in: [place] will receive a reminder letter in the mail two months in advance. If you don't receive a letter, please call our office to schedule the follow-up appointment.  PLEASE HAVE YOUR ECHO SCHEDULED AND DONE PRIOR TO THIS APPOINTMENT

## 2014-11-07 ENCOUNTER — Other Ambulatory Visit: Payer: Self-pay | Admitting: Cardiology

## 2014-12-08 ENCOUNTER — Other Ambulatory Visit: Payer: Self-pay | Admitting: Cardiothoracic Surgery

## 2014-12-08 DIAGNOSIS — I7121 Aneurysm of the ascending aorta, without rupture: Secondary | ICD-10-CM

## 2014-12-08 DIAGNOSIS — I719 Aortic aneurysm of unspecified site, without rupture: Secondary | ICD-10-CM

## 2014-12-15 ENCOUNTER — Other Ambulatory Visit: Payer: Self-pay | Admitting: Cardiothoracic Surgery

## 2014-12-15 ENCOUNTER — Other Ambulatory Visit: Payer: Self-pay | Admitting: *Deleted

## 2014-12-15 DIAGNOSIS — I719 Aortic aneurysm of unspecified site, without rupture: Secondary | ICD-10-CM

## 2014-12-15 DIAGNOSIS — I7121 Aneurysm of the ascending aorta, without rupture: Secondary | ICD-10-CM

## 2015-01-10 DIAGNOSIS — G4733 Obstructive sleep apnea (adult) (pediatric): Secondary | ICD-10-CM | POA: Diagnosis not present

## 2015-01-12 ENCOUNTER — Other Ambulatory Visit: Payer: PPO

## 2015-01-12 ENCOUNTER — Ambulatory Visit: Payer: PPO | Admitting: Cardiothoracic Surgery

## 2015-02-08 DIAGNOSIS — E7800 Pure hypercholesterolemia, unspecified: Secondary | ICD-10-CM | POA: Diagnosis not present

## 2015-02-08 DIAGNOSIS — I482 Chronic atrial fibrillation: Secondary | ICD-10-CM | POA: Diagnosis not present

## 2015-02-08 DIAGNOSIS — I10 Essential (primary) hypertension: Secondary | ICD-10-CM | POA: Diagnosis not present

## 2015-02-09 ENCOUNTER — Ambulatory Visit
Admission: RE | Admit: 2015-02-09 | Discharge: 2015-02-09 | Disposition: A | Discharge status: Home / Self Care | Payer: PPO | Source: Ambulatory Visit | Attending: Cardiothoracic Surgery | Admitting: Cardiothoracic Surgery

## 2015-02-09 ENCOUNTER — Ambulatory Visit (INDEPENDENT_AMBULATORY_CARE_PROVIDER_SITE_OTHER): Payer: PPO | Admitting: Cardiothoracic Surgery

## 2015-02-09 VITALS — BP 121/78 | HR 67 | Resp 16 | Ht 70.0 in | Wt 214.0 lb

## 2015-02-09 DIAGNOSIS — I712 Thoracic aortic aneurysm, without rupture: Secondary | ICD-10-CM

## 2015-02-09 DIAGNOSIS — I7121 Aneurysm of the ascending aorta, without rupture: Secondary | ICD-10-CM

## 2015-02-09 DIAGNOSIS — I719 Aortic aneurysm of unspecified site, without rupture: Secondary | ICD-10-CM

## 2015-02-09 MED ORDER — IOPAMIDOL (ISOVUE-370) INJECTION 76%
100.0000 mL | Freq: Once | INTRAVENOUS | Status: AC | PRN
  Administered 2015-02-09: 100 mL via INTRAVENOUS

## 2015-02-09 NOTE — Progress Notes (Signed)
[address]       [place] [address]             [phone]                        Consuelo E Waldschmidt [facility] Medical Record [id number] Date of Birth: [birth year]  Referring: [name], MD Primary Care: Dr Nona Dell Cardiology : Dr Ottie Glazier  Chief Complaint:    Chief Complaint  Patient presents with  . Thoracic Aortic Aneurysm    1 year f/u with CTA Chest ..ECHO 10/06/14    History of Present Illness:    Patient is a 70 year old male referred because of dilated ascending aorta noted on CT scan done at [facility] May 11 2012. The patient notes that in May he was referred to [facility] for cardiology clearance prior to EAR surgery. He notes that Dr. Jimmie Molly was doing a stress echo on him but never completed it because  a dilated aortic root , moderate aortic regurgitation and  mild to moderate mitral regurgitation was noted . A CT scan of the chest was performed following the echocardiogram 05/11/2012. He notes that he know his aorta was large for "years".   The patient also he has been in atrial fibrillation for several years and has a history of at least 2 pulmonary emboli.  He now sees  Dr. Nelson/cardiology followed   Both because of  atrial fibrillation history of pulmonary emboli and AI . He was started on xarelto. He continues on Xarelto without bleeding difficulty  He denies anginal or exertional chest, is not limited by shortness of breath. He does have known sleep apnea and uses BiPAP at night. He does note some fatigue  continues with good dental care seeing a dentist every 6 months.  He has had no previous history of myocardial infarction, but does have a brother who had a myocardial infarction at age 77. There is no family history of Marfan's or acute aortic dissections.    Current Activity/ Functional Status:  Patient is independent with mobility/ambulation, transfers, ADL's, IADL's.  Zubrod Score: At the time of  surgery this patient's most appropriate activity status/level should be described as: []  Normal activity, no symptoms [x]  Symptoms, fully ambulatory []  Symptoms, in bed less than or equal to 50% of the time []  Symptoms, in bed greater than 50% of the time but less than 100% []  Bedridden []  Moribund   Past Medical History  Diagnosis Date  . Hypertension   . Sleep apnea     CPAP  . BPH (benign prostatic hyperplasia)   . Pulmonary embolism   . Atrial fibrillation   . Hyperlipidemia   . Obesity   . Rhinitis   . Aortic regurgitation   . Aortic root aneurysm     Past Surgical History  Procedure Laterality Date  . Appendectomy    . Tonsillectomy    . Hernia repair      Family History  Problem Relation Age of Onset  . Stroke Mother   . Stroke Father   . Alzheimer's disease Mother   . Heart attack Mother   . Diabetes Mellitus II Father    No family history of Marfan's or aortic dissection.   History  Smoking status  . Never Smoker   Smokeless tobacco  . Never Used    History  Alcohol Use No     Allergies  Allergen  Reactions  . Statins Other (See Comments)    MYALGIAS    Current Outpatient Prescriptions  Medication Sig Dispense Refill  . b complex vitamins tablet Take 1 tablet by mouth daily.    . Chelated Zinc 50 MG TABS Take 1 tablet by mouth daily.    . finasteride (PROSCAR) 5 MG tablet Take 5 mg by mouth daily.    . fish oil-omega-3 fatty acids 1000 MG capsule Take 1,200 g by mouth daily.    . folic acid (FOLVITE) [id number] MCG tablet Take 800 mcg by mouth daily.    . hydrochlorothiazide (HYDRODIURIL) 25 MG tablet Take 25 mg by mouth daily.    . Inositol Niacinate (NO FLUSH NIACIN) 100 MG TABS Take 1 tablet by mouth 2 (two) times daily.    . meloxicam (MOBIC) 7.5 MG tablet Take 7.5 mg by mouth daily.    . metoprolol tartrate (LOPRESSOR) 25 MG tablet Take 25 mg by mouth 2 (two) times daily.     . Multiple Vitamins-Minerals (MENS MULTIVITAMIN PLUS PO) Take 1  tablet by mouth daily.    . Potassium 99 MG TABS Take 1 tablet by mouth daily.    . ranitidine (ZANTAC) 75 MG tablet Take 75 mg by mouth daily.    . Red Yeast Rice Extract (RED YEAST RICE PO) Take 800 mg by mouth 2 (two) times daily.    . tamsulosin (FLOMAX) 0.4 MG CAPS capsule Take 0.4 mg by mouth daily after supper.    Marland Kitchen UNABLE TO FIND Take 8 tablets by mouth daily. Med Name:Curamin    . XARELTO 20 MG TABS tablet TAKE ONE TABLET BY MOUTH ONCE DAILY WITH  SUPPER 30 tablet 9   No current facility-administered medications for this visit.       Review of Systems:     Cardiac Review of Systems: Y or N  Chest Pain [  n  ]  Resting SOB [  n ] Exertional SOB  [ y ]  Vertell Limber Florencio.Farrier  ]   Pedal Edema Florencio.Farrier   ]    Palpitations Blue.Reese  ] Syncope  Florencio.Farrier  ]   Presyncope Florencio.Farrier   ]  General Review of Systems: [Y] = yes [  ]=no Constitional: recent weight change [n  ]; anorexia [  ]; fatigue [ y ]; nausea [  ]; night sweats [n  ]; fever [  n]; or chills Florencio.Farrier  ];                                                                                                                                          Dental: poor dentition[ n ]; Last Dentist visit: reguler dental visits q 6 months  Eye : blurred vision [  ]; diplopia [   ]; vision changes [  ];  Amaurosis fugax[  ]; Resp: cough [ n ];  wheezing[ n ];  hemoptysis[n  ]; shortness of breath[  ]; paroxysmal nocturnal dyspnea[  ]; dyspnea on exertion[  ]; or orthopnea[  ];  GI:  gallstones[n  ], vomiting[  ];  dysphagia[  ]; melena[n  ];  hematochezia [ n ]; heartburn[ n ];   Hx of  Colonoscopy[ y 8 years ago "clean" ]; GU: kidney stones [n  ]; hematuria[n  ];   dysuria Blue.Reese  ];  nocturia[  ];  history of     obstruction [  y]; urinary frequency Blue.Reese  ]             Skin: rash, swelling[  ];, hair loss[  ];  peripheral edema[  ];  or itching[  ]; Musculosketetal: myalgias[  ];  joint swelling[  ];  joint erythema[  ];  joint pain[  ];  back pain[  ];  Heme/Lymph: bruising[  ];   bleeding[  ];  anemia[  ];  Neuro: TIA[n  ];  headaches[n  ];  stroke[n  ];  vertigo[  ];  seizures[ n ];   paresthesias[ n ];  difficulty walking[ n ];  Psych:depression[  ]; anxiety[  ];  Endocrine: diabetes[  ];  thyroid dysfunction[  ];  Immunizations: Flu [ refused  ]; Pneumococcal[refused  ];  Other:  Physical Exam: BP 121/78 mmHg  Pulse 67  Resp 16  Ht 5\' 10"  (1.778 m)  Wt 214 lb (97.07 kg)  BMI 30.71 kg/m2  SpO2 95%  Physical Examination: General appearance - alert, well appearing, and in no distress, oriented to person, place, and time and overweight Mental status - alert, oriented to person, place, and time, normal mood, behavior, speech, dress, motor activity, and thought processes Eyes - pupils equal and reactive, extraocular eye movements intact Ears - bilateral TM's and external ear canals normal, not examined Nose - normal and patent, no erythema, discharge or polyps Neck - supple, no significant adenopathy, carotids upstroke normal bilaterally, no bruits, thyroid exam: thyroid is normal in size without nodules or tenderness Chest - clear to auscultation, no wheezes, rales or rhonchi, symmetric air entry, no tachypnea, retractions or cyanosis Heart - normal rate, regular rhythm, normal S1, S2, no murmurs, rubs, clicks or gallops, diastolic murmur 2/6 at lower left sternal border, no JVD Abdomen - soft, nontender, nondistended, no masses or organomegaly no abdominal bruits no pulsatile masses no CVA tenderness no hernias noted Neurological - alert, oriented, normal speech, no focal findings or movement disorder noted, screening mental status exam normal, cranial nerves II through XII intact Musculoskeletal - no joint tenderness, deformity or swelling, no muscular tenderness noted, full range of motion without pain Extremities - peripheral pulses normal, no pedal edema, no clubbing or cyanosis, intact peripheral pulses, feet normal, good pulses, normal color, temperature  and sensation, monofilament sensory exam is normal in both feet Skin - normal coloration and turgor, no rashes, no suspicious skin lesions noted On physical exam no stigmata of Marfan's  Diagnostic Studies & Laboratory data:     Recent Radiology Findings: Ct Angio Chest Aorta W/cm &/or Wo/cm  02/09/2015  CLINICAL DATA:  Aneurysmal disease of the ascending thoracic aorta. EXAM: CT ANGIOGRAPHY CHEST WITH CONTRAST TECHNIQUE: Multidetector CT imaging of the chest was performed using the standard protocol during bolus administration of intravenous contrast. Multiplanar CT image reconstructions and MIPs were obtained to evaluate the vascular anatomy. CONTRAST:  100 mL Isovue 370 IV COMPARISON:  01/06/2014, 05/06/2013 and 05/11/2012 FINDINGS: Stable aneurysmal disease of the ascending thoracic aorta  measures 4.9 cm in greatest diameter. This is stable dating back to 2014. The aorta measures 4.4 cm at the sinuses of Valsalva. The proximal arch measures 3.7 cm. The distal arch measures 3.1 cm. The descending thoracic aorta measures 2.8 cm. No evidence of aortic dissection. Proximal great vessels show stable and normal patency. The innominate artery is tortuous. Stable cardiac enlargement. No pleural or pericardial fluid identified. Lungs show no evidence of edema, infiltrate or nodule. No enlarged lymph nodes are seen. Bony structures are unremarkable. Review of the MIP images confirms the above findings. IMPRESSION: Stable aneurysmal disease of the ascending thoracic aorta measuring 4.9 cm in greatest diameter. Electronically Signed   By: Aletta Edouard M.D.   On: 02/09/2015 10:32   On my measurement appears stable in size  I have independently reviewed the above radiology studies  and reviewed the findings with the patient.     Ct Angio Chest Aorta W/cm &/or Wo/cm  01/06/2014   CLINICAL DATA:  Followup ascending thoracic aortic aneurysm.  EXAM: CT ANGIOGRAPHY CHEST WITH CONTRAST  TECHNIQUE: Multidetector  CT imaging of the chest was performed using the standard protocol during bolus administration of intravenous contrast. Multiplanar CT image reconstructions and MIPs were obtained to evaluate the vascular anatomy.  CONTRAST:  32mL OMNIPAQUE IOHEXOL 350 MG/ML SOLN  COMPARISON:  05/06/2013.  FINDINGS: The maximum aortic diameter at the sinus of Valsalva, sino-tubular junction and ascending thoracic aorta are 4.0 cm, 4.6 cm and 4.4 cm respectively. These were previously 4.5 cm, 4.9 cm and 4.6 cm respectively, measured at the same levels.  The degree of dilatation of the left atrium is unchanged. Minimal residual atelectasis or scarring at both lung bases. No lung nodules or enlarged lymph nodes. Stable mild narrowing of the origin of the celiac axis. Mild thoracic spine degenerative changes.  Review of the MIP images confirms the above findings.  IMPRESSION: Interval mild decrease in size of the previously demonstrated dilated aortic root, sino-tubular junction and ascending thoracic aorta. The maximum diameter today is 4.6 cm at the sino-tubular junction.   Electronically Signed   By: Enrique Sack M.D.   On: 01/06/2014 11:56     Ct Angio Chest Aorta W/cm &/or Wo/cm  05/06/2013   CLINICAL DATA:  Aortic root evaluation  EXAM: CT ANGIOGRAPHY CHEST WITH CONTRAST  TECHNIQUE: Multidetector CT imaging of the chest was performed using the standard protocol during bolus administration of intravenous contrast. Multiplanar CT image reconstructions and MIPs were obtained to evaluate the vascular anatomy.  CONTRAST:  57mL OMNIPAQUE IOHEXOL 350 MG/ML SOLN  COMPARISON:  None.  FINDINGS: Maximal aortic diameter is at the sinus of Valsalva, sino-tubular junction, and ascending aorta are 4.5 cm, 4.9 cm, and 4.6 cm respectively. Mild coronary artery calcifications in the circumflex and LAD coronary arteries. Minimal right coronary artery calcifications.  Great vessels are patent. Vertebral arteries are patent within the confines of  the exam.  There is mild dilatation of the left atrium. Very minimal peripheral aortic valve calcifications.  Limited images of the abdomen demonstrate mild narrowing at the origin of the celiac axis and patency at the origin of the SMA.  No abnormal mediastinal adenopathy.  No mediastinal mass effect.  Bibasilar atelectasis.  No pneumothorax or pleural effusion. Chronic right upper rib deformities. No definite acute bony deformity. Posterior disc osteophytes at C6-7 with severe disc space narrowing.  Review of the MIP images confirms the above findings.  IMPRESSION: There is significant dilatation of the aortic root as  described. The sino-tubular junction is 4.9 cm in diameter. No evidence of dissection. Minimal peripheral aortic valvular calcifications.   Electronically Signed   By: Maryclare Bean M.D.   On: 05/06/2013 16:21   05/11/2012 CT of Chest: CT ANGIOGRAPHY CHEST Comparison: CT scan of June 20, 2008. Findings: No significant abnormality seen involving the pulmonary parenchyma. No pleural effusions or pneumothorax are noted. Normal contrast filling of pulmonary arteries is noted. There is no evidence of aortic dissection. Coronary artery calcifications are noted consistent with coronary artery disease. There is mild dilatation of the ascending thoracic aorta is again noted which now measures 5.0 cm which is slightly increased compared to prior exam of 2010. No mass or adenopathy is noted in the mediastinum. Visualized portions of upper abdomen are within normal limits.  IMPRESSION: No evidence of thoracic aortic dissection is noted. Coronary artery calcifications are noted suggesting coronary artery disease. Aneurysmal dilatation of the ascending thoracic aorta is noted with maximum measured diameter of 5.0 cm which has increased compared to prior exam of 2010. Original Report Authenticated By: Marijo Conception., M.D.    02/2006 CT of Chest Satisfactory contrast-opacification of pulmonary  vasculature. Right upper lobe evidence for pulmonary thromboembolism. Ascending aorta measures 4.4 cm transverse by 4.7 cm AP. Descending aorta measures 3.2 cm transverse by 3.1 cm AP. No acute pulmonary infiltrate. No pleural effusion. No adenopathy. Normal cardiac size. 11/02/2007 CT of Chest: Findings: The chest wall is stable. No supraclavicular or axillary adenopathy. The thyroid gland appears normal.  The heart is borderline enlarged but stable. No pericardial effusion. Stable small scattered mediastinal and hilar lymph nodes.  No change and fusiform aneurysmal dilatation of the ascending aorta with maximal measurements of 4.7 x 4.7 cm. No dissection. The major branch vessels are patent. The esophagus is grossly normal. There is a filling defect and a right lower lobe pulmonary artery consistent with pulmonary embolism. There is tortuosity of the descending thoracic aorta.  Examination of the lung parenchyma demonstrates minimal lingular scarring change. The airspace consolidation has resolved and this may be post pneumonic scarring. No infiltrates, edema or effusions. No interstitial lung disease. No pulmonary masses or nodules.  The upper abdomen is stable. There is a small left adrenal gland nodule which is likely a benign adenoma.  IMPRESSION:  1. Incidental pulmonary embolism is noted in a right lower lobe pulmonary artery. 2. Near complete resolution of left lingular process. There is minimal residual probable post pneumonic scarring changes. No acute pulmonary findings or pulmonary masses. 3. Stable ascending thoracic aortic aneurysm. 4. Borderline cardiac size, stable. 5. Stable small mediastinal and hilar lymph nodes. 6. Stable left adrenal gland nodule, likely benign adenoma. 7. Stable periportal and celiac axis lymph nodes.  06/20/2008 CT of Chest: Comparison: CT chest without and with contrast 11/02/2007. Findings: Measured in the axial plane at a level  approximately 2.5 cm above the aortic valve on series 4, image 30, maximum diameter of the ascending thoracic aorta remains unchanged at 4.7 cm. Tortuous aortic arch and proximal great vessels without significant atherosclerosis. Mild descending thoracic and upper abdominal aortic atherosclerosis. No evidence of aortic dissection. Heart mildly enlarged but stable. No visible coronary artery calcification. No pericardial effusion. Interval resolution of the segmental right lower lobe of pulmonary embolus; no central pulmonary emboli currently.  Stable scattered normal-sized mediastinal and hilar lymph nodes; no new, enlarging, or suspicious lymphadenopathy. Normal thyroid gland. Minimal scarring in the lingula and in the lower lobes. Lungs otherwise clear without localized  airspace consolidation, interstitial disease, or nodularity. High resolution expiratory images demonstrate scattered areas of hyperlucency consistent with localized air trapping. Stable approximate 1.1 cm left adrenal adenoma. Normal-sized porta hepatis and celiac axis lymph nodes again noted and unchanged. Remaining visualized upper abdomen unremarkable for the early arterial phase of enhancement. Bone window images again demonstrate mild thoracic spondylosis.    ECHO:  LV EF: 45% -  50%  ------------------------------------------------------------------- Indications:   Atrial fibrillation (I48.91).  ------------------------------------------------------------------- History:  PMH:  Atrial fibrillation. Risk factors: Aortic insufficiency. OSA. Aortic root aneurysm. H/o pulmonary embolus.  ------------------------------------------------------------------- Study Conclusions  - Left ventricle: The cavity size was mildly dilated. There was mild concentric hypertrophy. Systolic function was mildly reduced. The estimated ejection fraction was in the range of 45% to 50%. Wall motion was normal;  there were no regional wall motion abnormalities. - Aortic valve: There was mild to moderate regurgitation. - Aorta: Aortic root dimension: 39 mm (ED). Ascending aortic diameter: 44 mm (S). - Aortic root: The aortic root was mildly dilated. - Ascending aorta: The ascending aorta was moderately dilated; it was unchanged fromthe study of 06/24/2013. - Mitral valve: There was mild regurgitation. - Left atrium: The atrium was severely dilated. - Right ventricle: The cavity size was mildly dilated. Wall thickness was normal. Systolic function was normal. - Right atrium: The atrium was mildly dilated. - Tricuspid valve: There was trivial regurgitation. - Pulmonary arteries: Systolic pressure was mildly increased. PA peak pressure: 31 mm Hg (S). - Inferior vena cava: The vessel was dilated. The respirophasic diameter changes were blunted (< 50%).  ------------------------------------------------------------------- Labs, prior tests, procedures, and surgery: Transthoracic echocardiography (06/24/2013).   EF was 55%.  Echocardiography. M-mode, complete 2D, spectral Doppler, and color Doppler. Birthdate: Patient birthdate: 03/01/1945. Age: Patient is 70 yr old. Sex: Gender: male.  BMI: 30.4 kg/m^2. Blood pressure:   133/82 Patient status: Outpatient. Study date: Study date: 10/06/2014. Study time: 07:26 AM. Location: [facility]  -------------------------------------------------------------------  ------------------------------------------------------------------- Left ventricle: The cavity size was mildly dilated. There was mild concentric hypertrophy. Systolic function was mildly reduced. The estimated ejection fraction was in the range of 45% to 50%. Wall motion was normal; there were no regional wall motion abnormalities. The study was not technically sufficient to allow evaluation of LV diastolic dysfunction due to atrial  fibrillation.  ------------------------------------------------------------------- Aortic valve:  Trileaflet; normal thickness leaflets. Mobility was not restricted. Doppler: Transvalvular velocity was within the normal range. There was no stenosis. There was mild to moderate regurgitation.  ------------------------------------------------------------------- Aorta: Aortic root: The aortic root was mildly dilated. Ascending aorta: The ascending aorta was moderately dilated; it was unchanged fromthe study of 06/24/2013.  ------------------------------------------------------------------- Mitral valve:  Structurally normal valve.  Mobility was not restricted. Doppler: Transvalvular velocity was within the normal range. There was no evidence for stenosis. There was mild regurgitation.  ------------------------------------------------------------------- Left atrium: The atrium was severely dilated.  ------------------------------------------------------------------- Right ventricle: The cavity size was mildly dilated. Wall thickness was normal. Systolic function was normal.  ------------------------------------------------------------------- Pulmonic valve:  Structurally normal valve.  Cusp separation was normal. Doppler: Transvalvular velocity was within the normal range. There was no evidence for stenosis. There was no regurgitation.  ------------------------------------------------------------------- Tricuspid valve:  Structurally normal valve.  Doppler: Transvalvular velocity was within the normal range. There was trivial regurgitation.  ------------------------------------------------------------------- Pulmonary artery:  The main pulmonary artery was normal-sized. Systolic pressure was mildly increased.  ------------------------------------------------------------------- Right atrium: The atrium was mildly  dilated.  ------------------------------------------------------------------- Pericardium: There was no pericardial effusion.  -------------------------------------------------------------------  Systemic veins: Inferior vena cava: The vessel was dilated. The respirophasic diameter changes were blunted (< 50%). Proximal diameter during inspiration: 16.07 mm. Proximal diameter during expiration: 29.56 mm. Respirophasic change in diameter: 45.64%.  ------------------------------------------------------------------- Measurements  IVC                   Value    Reference ID, proximal, insp            16.07 mm   --------- ID, proximal, exp            29.56 mm   --------- Collapse                 45.64 %   ---------  Left ventricle              Value    Reference LV ID, ED, PLAX chordal     (H)   59  mm   43 - 52 LV ID, ES, PLAX chordal     (H)   44  mm   23 - 38 LV fx shortening, PLAX chordal  (L)   25  %   >=29 LV PW thickness, ED           11  mm   --------- IVS/LV PW ratio, ED           1      <=1.3 Stroke volume, 2D            102  ml   --------- Stroke volume/bsa, 2D          46  ml/m^2 ---------  Ventricular septum            Value    Reference IVS thickness, ED            11  mm   ---------  LVOT                   Value    Reference LVOT ID, S                25  mm   --------- LVOT area                4.91 cm^2  --------- LVOT mean velocity, S          62.2 cm/s  --------- LVOT VTI, S               20.8 cm   ---------  Aortic valve               Value    Reference Aortic regurg pressure half-time     488  ms   ---------  Aorta                   Value    Reference Aortic root ID, ED            39  mm   --------- Ascending aorta ID, A-P, S        44  mm   ---------  Left atrium               Value    Reference LA ID, A-P, ES              46  mm   --------- LA ID/bsa, A-P              2.09 cm/m^2 <=2.2 LA volume, S  124  ml   --------- LA volume/bsa, S             56.2 ml/m^2 --------- LA volume, ES, 1-p A4C          112  ml   --------- LA volume/bsa, ES, 1-p A4C        50.8 ml/m^2 --------- LA volume, ES, 1-p A2C          131  ml   --------- LA volume/bsa, ES, 1-p A2C        59.4 ml/m^2 ---------  Pulmonary arteries            Value    Reference PA pressure, S, DP        (H)   31  mm Hg <=30  Tricuspid valve             Value    Reference Tricuspid regurg peak velocity      240  cm/s  --------- Tricuspid peak RV-RA gradient      23  mm Hg ---------  Systemic veins              Value    Reference Estimated CVP              8   mm Hg ---------  Right ventricle             Value    Reference RV pressure, S, DP        (H)   31  mm Hg <=30  Legend: (L) and (H) mark values outside specified reference range.  ------------------------------------------------------------------- Prepared and Electronically Authenticated by  Skeet Latch, MD 2016-09-29T08:49:00    PFT's: FEV1 2.08  61% +10% post treatment  DLCO 115 %    Assessment / Plan:     1)  Dilated ascending aorta, chronic 4.7 cm in 2008 now 4.9 cm today  ,                    Aortic Size Index=    4.9     /Body surface area is 2.27 meters squared. = 2.15                                                      < 2.75 cm/m2      4%  risk per year                                                       2.75 to 4.25          8% risk per year                                                      > 4.25 cm/m2    20% risk per year  #2 valvular heart disease by echo report: Trileaflet aortic valve  Currently the degree of aortic insufficiency is  mild to moderate regurgitation., there also mild left ventricular dilatation (LVEDD = 59 up from 57 one  year before cm).  #3 chronic atrial fibrillation: now on anticoagulation #4 extensive coronary artery calcification on CT scan, positive family history of early coronary artery disease, no previous history of ischemic events, no current symptoms #5 history of pulmonary emboli x2, no history of workup for hypercoagulable state  #6 obstructive sleep apnea/Moderaltly Severe Airflow limitation #7 hyperlipidemia  #8 benign prostatic hypertrophy #9 history of hypertension, he is currently on a beta blocker  I have reviewed with him the most recent CT scan and recommended that we repeat surveillance of the size of his aorta with a CT of the chest in one year  With follow-up echocardiogram also.   I reviewed with him the signs and symptoms of aortic dissection,  We discussed proceeding with elective aortic replacement with aorta in the vicinity of 5.5 cm orthopedic develops worsening aortic insufficiency and requires aortic valve replacement. Patient remains asymptomatic but he does have some slight increase in size of the end diastolic LV dimension.  He has a follow-up appointment with Dr. Meda Coffee .   Grace Isaac MD      [address] [place] [address] Office [phone]   Beeper [id number]  02/09/2015 10:37 AM

## 2015-02-10 ENCOUNTER — Encounter: Payer: Self-pay | Admitting: Cardiothoracic Surgery

## 2015-02-10 DIAGNOSIS — G4733 Obstructive sleep apnea (adult) (pediatric): Secondary | ICD-10-CM | POA: Diagnosis not present

## 2015-02-16 DIAGNOSIS — G4733 Obstructive sleep apnea (adult) (pediatric): Secondary | ICD-10-CM | POA: Diagnosis not present

## 2015-03-10 DIAGNOSIS — G4733 Obstructive sleep apnea (adult) (pediatric): Secondary | ICD-10-CM | POA: Diagnosis not present

## 2015-05-11 DIAGNOSIS — H903 Sensorineural hearing loss, bilateral: Secondary | ICD-10-CM | POA: Diagnosis not present

## 2015-05-12 DIAGNOSIS — I10 Essential (primary) hypertension: Secondary | ICD-10-CM | POA: Diagnosis not present

## 2015-05-12 DIAGNOSIS — E7800 Pure hypercholesterolemia, unspecified: Secondary | ICD-10-CM | POA: Diagnosis not present

## 2015-05-12 DIAGNOSIS — I4891 Unspecified atrial fibrillation: Secondary | ICD-10-CM | POA: Diagnosis not present

## 2015-05-15 DIAGNOSIS — M9902 Segmental and somatic dysfunction of thoracic region: Secondary | ICD-10-CM | POA: Diagnosis not present

## 2015-05-15 DIAGNOSIS — S161XXA Strain of muscle, fascia and tendon at neck level, initial encounter: Secondary | ICD-10-CM | POA: Diagnosis not present

## 2015-05-15 DIAGNOSIS — S29019A Strain of muscle and tendon of unspecified wall of thorax, initial encounter: Secondary | ICD-10-CM | POA: Diagnosis not present

## 2015-05-15 DIAGNOSIS — M5032 Other cervical disc degeneration, mid-cervical region, unspecified level: Secondary | ICD-10-CM | POA: Diagnosis not present

## 2015-05-15 DIAGNOSIS — M9901 Segmental and somatic dysfunction of cervical region: Secondary | ICD-10-CM | POA: Diagnosis not present

## 2015-05-16 DIAGNOSIS — M5032 Other cervical disc degeneration, mid-cervical region, unspecified level: Secondary | ICD-10-CM | POA: Diagnosis not present

## 2015-05-16 DIAGNOSIS — S161XXA Strain of muscle, fascia and tendon at neck level, initial encounter: Secondary | ICD-10-CM | POA: Diagnosis not present

## 2015-05-16 DIAGNOSIS — M9902 Segmental and somatic dysfunction of thoracic region: Secondary | ICD-10-CM | POA: Diagnosis not present

## 2015-05-16 DIAGNOSIS — M9901 Segmental and somatic dysfunction of cervical region: Secondary | ICD-10-CM | POA: Diagnosis not present

## 2015-05-16 DIAGNOSIS — S29019A Strain of muscle and tendon of unspecified wall of thorax, initial encounter: Secondary | ICD-10-CM | POA: Diagnosis not present

## 2015-05-22 DIAGNOSIS — M5032 Other cervical disc degeneration, mid-cervical region, unspecified level: Secondary | ICD-10-CM | POA: Diagnosis not present

## 2015-05-22 DIAGNOSIS — M9901 Segmental and somatic dysfunction of cervical region: Secondary | ICD-10-CM | POA: Diagnosis not present

## 2015-05-22 DIAGNOSIS — M9902 Segmental and somatic dysfunction of thoracic region: Secondary | ICD-10-CM | POA: Diagnosis not present

## 2015-05-22 DIAGNOSIS — S161XXA Strain of muscle, fascia and tendon at neck level, initial encounter: Secondary | ICD-10-CM | POA: Diagnosis not present

## 2015-05-22 DIAGNOSIS — S29019A Strain of muscle and tendon of unspecified wall of thorax, initial encounter: Secondary | ICD-10-CM | POA: Diagnosis not present

## 2015-05-25 DIAGNOSIS — M9902 Segmental and somatic dysfunction of thoracic region: Secondary | ICD-10-CM | POA: Diagnosis not present

## 2015-05-25 DIAGNOSIS — S29019A Strain of muscle and tendon of unspecified wall of thorax, initial encounter: Secondary | ICD-10-CM | POA: Diagnosis not present

## 2015-05-25 DIAGNOSIS — S161XXA Strain of muscle, fascia and tendon at neck level, initial encounter: Secondary | ICD-10-CM | POA: Diagnosis not present

## 2015-05-25 DIAGNOSIS — M9901 Segmental and somatic dysfunction of cervical region: Secondary | ICD-10-CM | POA: Diagnosis not present

## 2015-05-25 DIAGNOSIS — M5032 Other cervical disc degeneration, mid-cervical region, unspecified level: Secondary | ICD-10-CM | POA: Diagnosis not present

## 2015-05-25 DIAGNOSIS — H903 Sensorineural hearing loss, bilateral: Secondary | ICD-10-CM | POA: Diagnosis not present

## 2015-05-29 DIAGNOSIS — S161XXA Strain of muscle, fascia and tendon at neck level, initial encounter: Secondary | ICD-10-CM | POA: Diagnosis not present

## 2015-05-29 DIAGNOSIS — M5032 Other cervical disc degeneration, mid-cervical region, unspecified level: Secondary | ICD-10-CM | POA: Diagnosis not present

## 2015-05-29 DIAGNOSIS — S29019A Strain of muscle and tendon of unspecified wall of thorax, initial encounter: Secondary | ICD-10-CM | POA: Diagnosis not present

## 2015-05-29 DIAGNOSIS — M9901 Segmental and somatic dysfunction of cervical region: Secondary | ICD-10-CM | POA: Diagnosis not present

## 2015-05-29 DIAGNOSIS — M9902 Segmental and somatic dysfunction of thoracic region: Secondary | ICD-10-CM | POA: Diagnosis not present

## 2015-06-01 DIAGNOSIS — M9902 Segmental and somatic dysfunction of thoracic region: Secondary | ICD-10-CM | POA: Diagnosis not present

## 2015-06-01 DIAGNOSIS — S29019A Strain of muscle and tendon of unspecified wall of thorax, initial encounter: Secondary | ICD-10-CM | POA: Diagnosis not present

## 2015-06-01 DIAGNOSIS — S161XXA Strain of muscle, fascia and tendon at neck level, initial encounter: Secondary | ICD-10-CM | POA: Diagnosis not present

## 2015-06-01 DIAGNOSIS — M9901 Segmental and somatic dysfunction of cervical region: Secondary | ICD-10-CM | POA: Diagnosis not present

## 2015-06-01 DIAGNOSIS — M5032 Other cervical disc degeneration, mid-cervical region, unspecified level: Secondary | ICD-10-CM | POA: Diagnosis not present

## 2015-06-06 DIAGNOSIS — S29019A Strain of muscle and tendon of unspecified wall of thorax, initial encounter: Secondary | ICD-10-CM | POA: Diagnosis not present

## 2015-06-06 DIAGNOSIS — M9901 Segmental and somatic dysfunction of cervical region: Secondary | ICD-10-CM | POA: Diagnosis not present

## 2015-06-06 DIAGNOSIS — M5032 Other cervical disc degeneration, mid-cervical region, unspecified level: Secondary | ICD-10-CM | POA: Diagnosis not present

## 2015-06-06 DIAGNOSIS — M9902 Segmental and somatic dysfunction of thoracic region: Secondary | ICD-10-CM | POA: Diagnosis not present

## 2015-06-06 DIAGNOSIS — S161XXA Strain of muscle, fascia and tendon at neck level, initial encounter: Secondary | ICD-10-CM | POA: Diagnosis not present

## 2015-06-08 DIAGNOSIS — S161XXA Strain of muscle, fascia and tendon at neck level, initial encounter: Secondary | ICD-10-CM | POA: Diagnosis not present

## 2015-06-08 DIAGNOSIS — M9902 Segmental and somatic dysfunction of thoracic region: Secondary | ICD-10-CM | POA: Diagnosis not present

## 2015-06-08 DIAGNOSIS — M5032 Other cervical disc degeneration, mid-cervical region, unspecified level: Secondary | ICD-10-CM | POA: Diagnosis not present

## 2015-06-08 DIAGNOSIS — M9901 Segmental and somatic dysfunction of cervical region: Secondary | ICD-10-CM | POA: Diagnosis not present

## 2015-06-08 DIAGNOSIS — S29019A Strain of muscle and tendon of unspecified wall of thorax, initial encounter: Secondary | ICD-10-CM | POA: Diagnosis not present

## 2015-06-13 DIAGNOSIS — M5032 Other cervical disc degeneration, mid-cervical region, unspecified level: Secondary | ICD-10-CM | POA: Diagnosis not present

## 2015-06-13 DIAGNOSIS — M9901 Segmental and somatic dysfunction of cervical region: Secondary | ICD-10-CM | POA: Diagnosis not present

## 2015-06-13 DIAGNOSIS — S29019A Strain of muscle and tendon of unspecified wall of thorax, initial encounter: Secondary | ICD-10-CM | POA: Diagnosis not present

## 2015-06-13 DIAGNOSIS — M9902 Segmental and somatic dysfunction of thoracic region: Secondary | ICD-10-CM | POA: Diagnosis not present

## 2015-06-13 DIAGNOSIS — S161XXA Strain of muscle, fascia and tendon at neck level, initial encounter: Secondary | ICD-10-CM | POA: Diagnosis not present

## 2015-06-20 DIAGNOSIS — S29019A Strain of muscle and tendon of unspecified wall of thorax, initial encounter: Secondary | ICD-10-CM | POA: Diagnosis not present

## 2015-06-20 DIAGNOSIS — M9901 Segmental and somatic dysfunction of cervical region: Secondary | ICD-10-CM | POA: Diagnosis not present

## 2015-06-20 DIAGNOSIS — M9902 Segmental and somatic dysfunction of thoracic region: Secondary | ICD-10-CM | POA: Diagnosis not present

## 2015-06-20 DIAGNOSIS — M5032 Other cervical disc degeneration, mid-cervical region, unspecified level: Secondary | ICD-10-CM | POA: Diagnosis not present

## 2015-06-20 DIAGNOSIS — S161XXA Strain of muscle, fascia and tendon at neck level, initial encounter: Secondary | ICD-10-CM | POA: Diagnosis not present

## 2015-06-26 DIAGNOSIS — M9901 Segmental and somatic dysfunction of cervical region: Secondary | ICD-10-CM | POA: Diagnosis not present

## 2015-06-26 DIAGNOSIS — M9902 Segmental and somatic dysfunction of thoracic region: Secondary | ICD-10-CM | POA: Diagnosis not present

## 2015-06-26 DIAGNOSIS — S29019A Strain of muscle and tendon of unspecified wall of thorax, initial encounter: Secondary | ICD-10-CM | POA: Diagnosis not present

## 2015-06-26 DIAGNOSIS — S161XXA Strain of muscle, fascia and tendon at neck level, initial encounter: Secondary | ICD-10-CM | POA: Diagnosis not present

## 2015-06-26 DIAGNOSIS — M5032 Other cervical disc degeneration, mid-cervical region, unspecified level: Secondary | ICD-10-CM | POA: Diagnosis not present

## 2015-07-03 DIAGNOSIS — S29019A Strain of muscle and tendon of unspecified wall of thorax, initial encounter: Secondary | ICD-10-CM | POA: Diagnosis not present

## 2015-07-03 DIAGNOSIS — M5032 Other cervical disc degeneration, mid-cervical region, unspecified level: Secondary | ICD-10-CM | POA: Diagnosis not present

## 2015-07-03 DIAGNOSIS — M9901 Segmental and somatic dysfunction of cervical region: Secondary | ICD-10-CM | POA: Diagnosis not present

## 2015-07-03 DIAGNOSIS — S161XXA Strain of muscle, fascia and tendon at neck level, initial encounter: Secondary | ICD-10-CM | POA: Diagnosis not present

## 2015-07-03 DIAGNOSIS — M9902 Segmental and somatic dysfunction of thoracic region: Secondary | ICD-10-CM | POA: Diagnosis not present

## 2015-07-17 DIAGNOSIS — M9901 Segmental and somatic dysfunction of cervical region: Secondary | ICD-10-CM | POA: Diagnosis not present

## 2015-07-17 DIAGNOSIS — M9902 Segmental and somatic dysfunction of thoracic region: Secondary | ICD-10-CM | POA: Diagnosis not present

## 2015-07-17 DIAGNOSIS — M5032 Other cervical disc degeneration, mid-cervical region, unspecified level: Secondary | ICD-10-CM | POA: Diagnosis not present

## 2015-07-17 DIAGNOSIS — S161XXA Strain of muscle, fascia and tendon at neck level, initial encounter: Secondary | ICD-10-CM | POA: Diagnosis not present

## 2015-07-17 DIAGNOSIS — S29019A Strain of muscle and tendon of unspecified wall of thorax, initial encounter: Secondary | ICD-10-CM | POA: Diagnosis not present

## 2015-07-21 DIAGNOSIS — M1711 Unilateral primary osteoarthritis, right knee: Secondary | ICD-10-CM | POA: Diagnosis not present

## 2015-08-01 DIAGNOSIS — M1711 Unilateral primary osteoarthritis, right knee: Secondary | ICD-10-CM | POA: Diagnosis not present

## 2015-08-07 DIAGNOSIS — M9901 Segmental and somatic dysfunction of cervical region: Secondary | ICD-10-CM | POA: Diagnosis not present

## 2015-08-07 DIAGNOSIS — M9902 Segmental and somatic dysfunction of thoracic region: Secondary | ICD-10-CM | POA: Diagnosis not present

## 2015-08-07 DIAGNOSIS — M5032 Other cervical disc degeneration, mid-cervical region, unspecified level: Secondary | ICD-10-CM | POA: Diagnosis not present

## 2015-08-07 DIAGNOSIS — S29019A Strain of muscle and tendon of unspecified wall of thorax, initial encounter: Secondary | ICD-10-CM | POA: Diagnosis not present

## 2015-08-07 DIAGNOSIS — S161XXA Strain of muscle, fascia and tendon at neck level, initial encounter: Secondary | ICD-10-CM | POA: Diagnosis not present

## 2015-09-03 ENCOUNTER — Other Ambulatory Visit: Payer: Self-pay | Admitting: Cardiology

## 2015-09-06 DIAGNOSIS — I340 Nonrheumatic mitral (valve) insufficiency: Secondary | ICD-10-CM | POA: Diagnosis not present

## 2015-09-06 DIAGNOSIS — Z6831 Body mass index (BMI) 31.0-31.9, adult: Secondary | ICD-10-CM | POA: Diagnosis not present

## 2015-09-06 DIAGNOSIS — Z1212 Encounter for screening for malignant neoplasm of rectum: Secondary | ICD-10-CM | POA: Diagnosis not present

## 2015-09-06 DIAGNOSIS — Z0000 Encounter for general adult medical examination without abnormal findings: Secondary | ICD-10-CM | POA: Diagnosis not present

## 2015-09-06 DIAGNOSIS — I739 Peripheral vascular disease, unspecified: Secondary | ICD-10-CM | POA: Diagnosis not present

## 2015-09-06 DIAGNOSIS — N400 Enlarged prostate without lower urinary tract symptoms: Secondary | ICD-10-CM | POA: Diagnosis not present

## 2015-09-06 DIAGNOSIS — R252 Cramp and spasm: Secondary | ICD-10-CM | POA: Diagnosis not present

## 2015-09-06 DIAGNOSIS — I482 Chronic atrial fibrillation: Secondary | ICD-10-CM | POA: Diagnosis not present

## 2015-09-06 DIAGNOSIS — I10 Essential (primary) hypertension: Secondary | ICD-10-CM | POA: Diagnosis not present

## 2015-09-06 DIAGNOSIS — I2782 Chronic pulmonary embolism: Secondary | ICD-10-CM | POA: Diagnosis not present

## 2015-09-06 DIAGNOSIS — E7800 Pure hypercholesterolemia, unspecified: Secondary | ICD-10-CM | POA: Diagnosis not present

## 2015-09-06 DIAGNOSIS — Z1389 Encounter for screening for other disorder: Secondary | ICD-10-CM | POA: Diagnosis not present

## 2015-10-04 ENCOUNTER — Other Ambulatory Visit: Payer: Self-pay | Admitting: Cardiology

## 2015-10-09 DIAGNOSIS — H524 Presbyopia: Secondary | ICD-10-CM | POA: Diagnosis not present

## 2015-10-12 ENCOUNTER — Other Ambulatory Visit (HOSPITAL_COMMUNITY): Payer: PPO

## 2015-11-16 ENCOUNTER — Encounter: Payer: Self-pay | Admitting: Cardiology

## 2015-11-16 ENCOUNTER — Ambulatory Visit (HOSPITAL_COMMUNITY): Payer: PPO | Attending: Cardiology

## 2015-11-16 ENCOUNTER — Other Ambulatory Visit: Payer: Self-pay

## 2015-11-16 ENCOUNTER — Encounter (INDEPENDENT_AMBULATORY_CARE_PROVIDER_SITE_OTHER): Payer: Self-pay

## 2015-11-16 ENCOUNTER — Ambulatory Visit (INDEPENDENT_AMBULATORY_CARE_PROVIDER_SITE_OTHER): Payer: PPO | Admitting: Cardiology

## 2015-11-16 VITALS — BP 120/70 | HR 68 | Ht 70.0 in | Wt 228.0 lb

## 2015-11-16 DIAGNOSIS — E782 Mixed hyperlipidemia: Secondary | ICD-10-CM

## 2015-11-16 DIAGNOSIS — Z789 Other specified health status: Secondary | ICD-10-CM

## 2015-11-16 DIAGNOSIS — I719 Aortic aneurysm of unspecified site, without rupture: Secondary | ICD-10-CM | POA: Diagnosis not present

## 2015-11-16 DIAGNOSIS — I340 Nonrheumatic mitral (valve) insufficiency: Secondary | ICD-10-CM | POA: Diagnosis not present

## 2015-11-16 DIAGNOSIS — I10 Essential (primary) hypertension: Secondary | ICD-10-CM

## 2015-11-16 DIAGNOSIS — I7121 Aneurysm of the ascending aorta, without rupture: Secondary | ICD-10-CM

## 2015-11-16 DIAGNOSIS — I351 Nonrheumatic aortic (valve) insufficiency: Secondary | ICD-10-CM

## 2015-11-16 DIAGNOSIS — Q2543 Congenital aneurysm of aorta: Secondary | ICD-10-CM

## 2015-11-16 DIAGNOSIS — I2510 Atherosclerotic heart disease of native coronary artery without angina pectoris: Secondary | ICD-10-CM | POA: Diagnosis not present

## 2015-11-16 MED ORDER — RIVAROXABAN 20 MG PO TABS: ORAL_TABLET | ORAL | 3 refills | Status: DC

## 2015-11-16 MED ORDER — METOPROLOL TARTRATE 25 MG PO TABS: 25.0000 mg | ORAL_TABLET | Freq: Two times a day (BID) | ORAL | 3 refills | Status: DC

## 2015-11-16 MED ORDER — HYDROCHLOROTHIAZIDE 25 MG PO TABS: 25.0000 mg | ORAL_TABLET | Freq: Every day | ORAL | 3 refills | Status: DC

## 2015-11-16 NOTE — Patient Instructions (Signed)

## 2015-11-16 NOTE — Progress Notes (Signed)
Patient ID: Aaron Richmond, male   DOB: 1945-11-09, 70 y.o.   MRN: [id number]    Patient Name: Aaron Richmond Date of Encounter: 11/16/2015  Primary Care Provider:  [name], MD Primary Cardiologist:  [name]  Patient Profile  Atrial fibrillation  Problem List   Past Medical History:  Diagnosis Date  . Aortic regurgitation   . 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 Atrial fibrillation ([place])   . BPH (benign prostatic hyperplasia)   . Hyperlipidemia   . Hypertension   . Obesity   . Pulmonary embolism ([place])   . Rhinitis   . Sleep apnea    CPAP   Past Surgical History:  Procedure Laterality Date  . APPENDECTOMY    . HERNIA REPAIR    . TONSILLECTOMY     Allergies  Allergies  Allergen Reactions  . Statins Other (See Comments)    MYALGIAS   HPI  70 year old with h/o hypertension and hyperlipidemia, persistent atrial fibrillation, h/o PE, known OSA on CPAP with known dilated ascending aorta noted on CT in 2008 measured at 4.7 cm and 5 cm in May 2014. The patient was referred to [facility] for preop visit prior to the ear surgery. He was scheduled for a stress echo that was not performed as there was a concern about dilated root and use of Dobutamine. Patient's brother had a myocardial infarction at age 19.   11/16/2015 - this is one year follow-up, patient states that he has no chest pain no dyspnea on exertion, his symptoms haven't changed all in the last year denies any palpitations or syncope. He has been compliant with his medications.    Home Medications  Prior to Admission medications   Medication Sig Start Date End Date Taking? Authorizing Provider  aspirin 81 MG tablet Take 81 mg by mouth daily. 2 tablets daily   Yes Historical Provider, MD  b complex vitamins tablet Take 1 tablet by mouth daily.   Yes Historical Provider, MD  Chelated Zinc 50 MG TABS Take 1 tablet by mouth daily.   Yes Historical Provider, MD  finasteride (PROSCAR) 5 MG  tablet Take 5 mg by mouth daily.   Yes Historical Provider, MD  fish oil-omega-3 fatty acids 1000 MG capsule Take 1,200 g by mouth daily.   Yes Historical Provider, MD  folic acid (FOLVITE) Q000111Q MCG tablet Take 800 mcg by mouth daily.   Yes Historical Provider, MD  hydrochlorothiazide (HYDRODIURIL) 25 MG tablet Take 25 mg by mouth daily.   Yes Historical Provider, MD  Inositol Niacinate (NO FLUSH NIACIN) 100 MG TABS Take 1 tablet by mouth 2 (two) times daily.   Yes Historical Provider, MD  meloxicam (MOBIC) 7.5 MG tablet Take 7.5 mg by mouth daily.   Yes Historical Provider, MD  metoprolol tartrate (LOPRESSOR) 25 MG tablet Take 25 mg by mouth daily.   Yes Historical Provider, MD  Multiple Vitamins-Minerals (MENS MULTIVITAMIN PLUS PO) Take 1 tablet by mouth daily.   Yes Historical Provider, MD  Nettle, Urtica Dioica, (NETTLE LEAF PO) Take 10 mg by mouth daily.   Yes Historical Provider, MD  Potassium 99 MG TABS Take 1 tablet by mouth daily.   Yes Historical Provider, MD  ranitidine (ZANTAC) 75 MG tablet Take 75 mg by mouth daily.   Yes Historical Provider, MD  Red Yeast Rice Extract (RED YEAST RICE PO) Take 800 mg by mouth 2 (two) times daily.   Yes Historical Provider, MD  tamsulosin [facility])  0.4 MG CAPS capsule Take 0.4 mg by mouth daily after supper.   Yes Historical Provider, MD  UNABLE TO FIND Take 2 tablets by mouth daily. Med Name:Curamin   Yes Historical Provider, MD    Family History  Family History  Problem Relation Age of Onset  . Stroke Mother   . Stroke Father   . Alzheimer's disease Mother   . Heart attack Mother   . Diabetes Mellitus II Father     Social History  Social History   Social History  . Marital status: Married    Spouse name: N/A  . Number of children: 0  . Years of education: N/A   Occupational History  . Engineering geologist    Social History Main Topics  . Smoking status: Never Smoker  . Smokeless tobacco: Never Used  . Alcohol use No  . Drug use: No   . Sexual activity: Not on file   Other Topics Concern  . Not on file   Social History Narrative  . No narrative on file     Review of Systems General:  No chills, fever, night sweats or weight changes.  Cardiovascular:  No chest pain, dyspnea on exertion, edema, orthopnea, palpitations, paroxysmal nocturnal dyspnea. Dermatological: No rash, lesions/masses Respiratory: No cough, dyspnea Urologic: No hematuria, dysuria Abdominal:   No nausea, vomiting, diarrhea, bright red blood per rectum, melena, or hematemesis Neurologic:  No visual changes, wkns, changes in mental status. All other systems reviewed and are otherwise negative except as noted above.  Physical Exam  Blood pressure 120/70, pulse 68, height 5\' 10"  (1.778 m), weight 228 lb (103.4 kg).  General: Pleasant, NAD Psych: Normal affect. Neuro: Alert and oriented X 3. Moves all extremities spontaneously. HEENT: Normal  Neck: Supple without bruits or JVD. Lungs:  Resp regular and unlabored, CTA. Heart: RRR no s3, s4, 2/6 diastolic murmur. Abdomen: Soft, non-tender, non-distended, BS + x 4.  Extremities: No clubbing, cyanosis or edema. DP/PT/Radials 2+ and equal bilaterally.  Accessory Clinical Findings  ECG - A fib rate controlled 82 BPM, non-specific ST changes  TTE: 10/15/2012 Study Conclusions  - Left ventricle: The cavity size was mildly dilated. Wall thickness was increased in a pattern of mild LVH. Systolic function was normal. The estimated ejection fraction was in the range of 50% to 55%. - Aortic valve: Mild regurgitation. - Aorta: The aorta was moderately dilated. - Left atrium: The atrium was moderately dilated.  TTE: 09/2014 Study Conclusions  - Left ventricle: The cavity size was mildly dilated. There was mild concentric hypertrophy. Systolic function was mildly reduced. The estimated ejection fraction was in the range of 45% to 50%. Wall motion was normal; there were no regional  wall motion abnormalities. - Aortic valve: There was mild to moderate regurgitation. - Aorta: Aortic root dimension: 39 mm (ED). Ascending aortic diameter: 44 mm (S). - Aortic root: The aortic root was mildly dilated. - Ascending aorta: The ascending aorta was moderately dilated; it was unchanged fromthe study of 06/24/2013. - Mitral valve: There was mild regurgitation. - Left atrium: The atrium was severely dilated. - Right ventricle: The cavity size was mildly dilated. Wall thickness was normal. Systolic function was normal. - Right atrium: The atrium was mildly dilated. - Tricuspid valve: There was trivial regurgitation. - Pulmonary arteries: Systolic pressure was mildly increased. PA peak pressure: 31 mm Hg (S). - Inferior vena cava: The vessel was dilated. The respirophasic diameter changes were blunted (< 50%).   Assessment & Plan  1)  Dilated ascending aorta, chronic 4.7 cm in 2008, stable in April 2015, I have personally reviewed his echocardiogram performed today and his ascending aorta measures 4.7 cm and aortic regurgitation is mild to moderate, LVEF remains 40-45% range and LVEDD remains mildly dilated. The patient is asymptomatic. Dr [name] evaluated the patient, wants to follow closely and operate when ascending aorta reaches 5.5 cm unless there is another indication. We will follow the patient in 6 months. Echocardiogram in 1 year.  2. CAD - The patient has extensive coronary artery calcification on CT scan, positive family history of early coronary artery disease. However, the patient is acting completely asymptomatic, he has no signs of angina or heart failure and doesn't require any ischemic workup at this point.  3. Chronic atrial fibrillation, CHA2DS2-VASc score 2. Continue Xarelto, no bleeding.  4. Hyperlipidemia - statin intolerance, but controlled on niacin, red yeast rice and diet  5. Hypertension - controlled on current regimen.  Follow up in 6  months. Echocardiogram in 1 year.  [name], MD 11/16/2015, 3:33 PM

## 2015-12-11 DIAGNOSIS — M1711 Unilateral primary osteoarthritis, right knee: Secondary | ICD-10-CM | POA: Diagnosis not present

## 2015-12-25 DIAGNOSIS — I10 Essential (primary) hypertension: Secondary | ICD-10-CM | POA: Diagnosis not present

## 2015-12-25 DIAGNOSIS — I4891 Unspecified atrial fibrillation: Secondary | ICD-10-CM | POA: Diagnosis not present

## 2016-01-26 ENCOUNTER — Other Ambulatory Visit: Payer: Self-pay | Admitting: Cardiothoracic Surgery

## 2016-01-26 DIAGNOSIS — I7121 Aneurysm of the ascending aorta, without rupture: Secondary | ICD-10-CM

## 2016-01-26 DIAGNOSIS — I719 Aortic aneurysm of unspecified site, without rupture: Secondary | ICD-10-CM

## 2016-02-15 ENCOUNTER — Ambulatory Visit: Payer: PPO | Admitting: Cardiothoracic Surgery

## 2016-02-15 ENCOUNTER — Inpatient Hospital Stay: Admission: RE | Admit: 2016-02-15 | Payer: PPO | Source: Ambulatory Visit

## 2016-03-21 ENCOUNTER — Ambulatory Visit
Admission: RE | Admit: 2016-03-21 | Discharge: 2016-03-21 | Disposition: A | Discharge status: Home / Self Care | Payer: PPO | Source: Ambulatory Visit | Attending: Cardiothoracic Surgery | Admitting: Cardiothoracic Surgery

## 2016-03-21 ENCOUNTER — Ambulatory Visit (INDEPENDENT_AMBULATORY_CARE_PROVIDER_SITE_OTHER): Payer: PPO | Admitting: Cardiothoracic Surgery

## 2016-03-21 ENCOUNTER — Encounter: Payer: Self-pay | Admitting: Cardiothoracic Surgery

## 2016-03-21 VITALS — BP 113/77 | HR 108 | Resp 16 | Ht 70.0 in | Wt 228.0 lb

## 2016-03-21 DIAGNOSIS — I7121 Aneurysm of the ascending aorta, without rupture: Secondary | ICD-10-CM

## 2016-03-21 DIAGNOSIS — I712 Thoracic aortic aneurysm, without rupture: Secondary | ICD-10-CM | POA: Diagnosis not present

## 2016-03-21 DIAGNOSIS — I719 Aortic aneurysm of unspecified site, without rupture: Secondary | ICD-10-CM

## 2016-03-21 MED ORDER — IOPAMIDOL (ISOVUE-370) INJECTION 76%
75.0000 mL | Freq: Once | INTRAVENOUS | Status: AC | PRN
  Administered 2016-03-21: 75 mL via INTRAVENOUS

## 2016-03-21 NOTE — Progress Notes (Signed)
[address]       [place] [address]             [phone]                        Cardale E Podolak [place] Medical Record #[id number] Date of Birth: 01/12/1945  Referring: [name], MD Primary Care: Dr Nona Dell Cardiology : Dr Ottie Glazier  Chief Complaint:    Chief Complaint  Patient presents with  . Thoracic Aortic Aneurysm    1 year f/u with CTA Chest    History of Present Illness:    Patient is a 71 year old male referred because of dilated ascending aorta noted on CT scan done at [facility] May 11 2012. The patient notes that in May he was referred to [facility] for cardiology clearance prior to EAR surgery. He notes that Dr. Jimmie Molly was doing a stress echo on him but never completed it because  a dilated aortic root , moderate aortic regurgitation and  mild to moderate mitral regurgitation was noted . A CT scan of the chest was performed following the echocardiogram 05/11/2012. He notes that he know his aorta was large for "years".   The patient also he has been in atrial fibrillation for several years and has a history of at least 2 pulmonary emboli.  He now sees  Dr. Nelson/cardiology followed   Because of  atrial fibrillation and history of pulmonary emboli and AI . He was started on xarelto. He continues on Xarelto without bleeding difficulty  He denies anginal or exertional chest, is not limited by shortness of breath. He does have known sleep apnea and uses BiPAP at night. He does note some fatigue  continues with good dental care seeing a dentist every 6 months.  He has had no previous history of myocardial infarction, but does have a brother who had a myocardial infarction at age 44. There is no family history of Marfan's or acute aortic dissections.    Current Activity/ Functional Status:  Patient is independent with mobility/ambulation, transfers, ADL's, IADL's.  Zubrod Score: At the time of surgery this  patient's most appropriate activity status/level should be described as: []  Normal activity, no symptoms [x]  Symptoms, fully ambulatory []  Symptoms, in bed less than or equal to 50% of the time []  Symptoms, in bed greater than 50% of the time but less than 100% []  Bedridden []  Moribund   Past Medical History:  Diagnosis Date  . Aortic regurgitation   . Aortic root aneurysm ([place])   . Atrial fibrillation ([place])   . BPH (benign prostatic hyperplasia)   . Hyperlipidemia   . Hypertension   . Obesity   . Pulmonary embolism ([place])   . Rhinitis   . Sleep apnea    CPAP    Past Surgical History:  Procedure Laterality Date  . APPENDECTOMY    . HERNIA REPAIR    . TONSILLECTOMY      Family History  Problem Relation Age of Onset  . Stroke Mother   . Stroke Father   . Alzheimer's disease Mother   . Heart attack Mother   . Diabetes Mellitus II Father    No family history of Marfan's or aortic dissection.   History  Smoking Status  . Never Smoker  Smokeless Tobacco  . Never Used    History  Alcohol Use No     Allergies  Allergen Reactions  .  Statins Other (See Comments)    MYALGIAS    Current Outpatient Prescriptions  Medication Sig Dispense Refill  . b complex vitamins tablet Take 1 tablet by mouth daily.    . Chelated Zinc 50 MG TABS Take 1 tablet by mouth daily.    . finasteride (PROSCAR) 5 MG tablet Take 5 mg by mouth daily.    . fish oil-omega-3 fatty acids 1000 MG capsule Take 1,200 g by mouth daily.    . folic acid (FOLVITE) 025 MCG tablet Take 800 mcg by mouth daily.    . hydrochlorothiazide (HYDRODIURIL) 25 MG tablet Take 1 tablet (25 mg total) by mouth daily. 90 tablet 3  . Inositol Niacinate (NO FLUSH NIACIN) 100 MG TABS Take 1 tablet by mouth 2 (two) times daily.    . meloxicam (MOBIC) 7.5 MG tablet Take 7.5 mg by mouth daily.    . metoprolol tartrate (LOPRESSOR) 25 MG tablet Take 1 tablet (25 mg total) by mouth 2 (two) times daily. 180 tablet 3  .  Multiple Vitamins-Minerals (MENS MULTIVITAMIN PLUS PO) Take 1 tablet by mouth daily.    . Potassium 99 MG TABS Take 1 tablet by mouth daily.    . ranitidine (ZANTAC) 75 MG tablet Take 75 mg by mouth daily.    . Red Yeast Rice Extract (RED YEAST RICE PO) Take 800 mg by mouth 2 (two) times daily.    . rivaroxaban (XARELTO) 20 MG TABS tablet TAKE 1 TABLET BY MOUTH DAILY WITH SUPPER. 90 tablet 3  . tamsulosin (FLOMAX) 0.4 MG CAPS capsule Take 0.4 mg by mouth daily after supper.    Marland Kitchen UNABLE TO FIND Take 8 tablets by mouth daily. Med Name:Curamin     No current facility-administered medications for this visit.        Review of Systems:     Cardiac Review of Systems: Y or N  Chest Pain [  n  ]  Resting SOB [  n ] Exertional SOB  [ y ]  Vertell Limber Florencio.Farrier  ]   Pedal Edema Florencio.Farrier   ]    Palpitations Blue.Reese  ] Syncope  Florencio.Farrier  ]   Presyncope Florencio.Farrier   ]  General Review of Systems: [Y] = yes [  ]=no Constitional: recent weight change [n  ]; anorexia [  ]; fatigue [ y ]; nausea [  ]; night sweats [n  ]; fever [  n]; or chills Florencio.Farrier  ];                                                                                                                                          Dental: poor dentition[ n ]; Last Dentist visit: reguler dental visits q 6 months  Eye : blurred vision [  ]; diplopia [   ]; vision changes [  ];  Amaurosis fugax[  ]; Resp: cough [ n ];  wheezing[ n ];  hemoptysis[n  ]; shortness of breath[  ]; paroxysmal nocturnal dyspnea[  ]; dyspnea on exertion[  ]; or orthopnea[  ];  GI:  gallstones[n  ], vomiting[  ];  dysphagia[  ]; melena[n  ];  hematochezia [ n ]; heartburn[ n ];   Hx of  Colonoscopy[ y 8 years ago "clean" ]; GU: kidney stones [n  ]; hematuria[n  ];   dysuria Blue.Reese  ];  nocturia[  ];  history of     obstruction [  y]; urinary frequency Blue.Reese  ]             Skin: rash, swelling[  ];, hair loss[  ];  peripheral edema[  ];  or itching[  ]; Musculosketetal: myalgias[  ];  joint swelling[  ];  joint erythema[   ];  joint pain[  ];  back pain[  ];  Heme/Lymph: bruising[  ];  bleeding[  ];  anemia[  ];  Neuro: TIA[n  ];  headaches[n  ];  stroke[n  ];  vertigo[  ];  seizures[ n ];   paresthesias[ n ];  difficulty walking[ n ];  Psych:depression[  ]; anxiety[  ];  Endocrine: diabetes[  ];  thyroid dysfunction[  ];  Immunizations: Flu [ refused  ]; Pneumococcal[refused  ];  Other:  Physical Exam: BP 113/77 (BP Location: Left Arm, Patient Position: Sitting, Cuff Size: Large)   Pulse (!) 108   Resp 16   Ht 5\' 10"  (1.778 m)   Wt 228 lb (103.4 kg)   SpO2 95% Comment: ON RA  BMI 32.71 kg/m   Physical Examination: General appearance - alert, well appearing, and in no distress, oriented to person, place, and time and overweight Mental status - alert, oriented to person, place, and time, normal mood, behavior, speech, dress, motor activity, and thought processes Eyes - pupils equal and reactive, extraocular eye movements intact Ears - bilateral TM's and external ear canals normal, not examined Nose - normal and patent, no erythema, discharge or polyps Neck - supple, no significant adenopathy, carotids upstroke normal bilaterally, no bruits, thyroid exam: thyroid is normal in size without nodules or tenderness Chest - clear to auscultation, no wheezes, rales or rhonchi, symmetric air entry, no tachypnea, retractions or cyanosis Heart - normal rate, regular rhythm, normal S1, S2, no murmurs, rubs, clicks or gallops, diastolic murmur 2/6 at lower left sternal border, no JVD Abdomen - soft, nontender, nondistended, no masses or organomegaly no abdominal bruits no pulsatile masses no CVA tenderness no hernias noted Neurological - alert, oriented, normal speech, no focal findings or movement disorder noted, screening mental status exam normal, cranial nerves II through XII intact Musculoskeletal - no joint tenderness, deformity or swelling, no muscular tenderness noted, full range of motion without  pain Extremities - peripheral pulses normal, no pedal edema, no clubbing or cyanosis, intact peripheral pulses, feet normal, good pulses, normal color, temperature and sensation, monofilament sensory exam is normal in both feet Skin - normal coloration and turgor, no rashes, no suspicious skin lesions noted On physical exam no stigmata of Marfan's  Diagnostic Studies & Laboratory data:     Recent Radiology Findings: Ct Angio Chest Aorta W/cm &/or Wo/cm  Result Date: 03/21/2016 CLINICAL DATA:  Follow-up aortic root aneurysm EXAM: CT ANGIOGRAPHY CHEST WITH CONTRAST TECHNIQUE: Multidetector CT imaging of the chest was performed using the standard protocol during bolus administration of intravenous contrast. Multiplanar CT image reconstructions and MIPs were obtained to evaluate the vascular anatomy. CONTRAST:  75 mL Isovue 370. Creatinine was obtained on site at [facility] at [address]Results: Creatinine 1.1 mg/dL. COMPARISON:  02/09/2015 FINDINGS: Cardiovascular: There is prominence of the ascending aorta again identified. At the level of the sinus of Valsalva, the aorta measures approximately 4.5 cm. The ascending aorta measures approximately 4.9 cm in greatest dimension stable in appearance from the prior exam. Despite being a gated study there is some persistent motion artifact identified. Mild coronary calcifications are again seen. The aortic arch tapers in a normal fashion. The brachiocephalic vessels are within normal limits with mild atherosclerotic calcifications. The descending thoracic aorta is within normal limits. The pulmonary artery is incompletely evaluated although no large central pulmonary embolus is noted. Mediastinum/Nodes: The thoracic inlet is within normal limits. No significant hilar or mediastinal adenopathy is noted. A few scattered small lymph nodes are noted stable from the prior exam. Lungs/Pleura: Lungs are well aerated bilaterally without focal infiltrate or  sizable parenchymal nodule. No effusion is seen. Upper Abdomen: Visualized upper abdomen is within normal limits. Musculoskeletal: No acute bony abnormality is noted. Review of the MIP images confirms the above findings. IMPRESSION: Stable appearance of the ascending aorta dilated to 4.9 cm in maximum dimension. Ascending thoracic aortic aneurysm. Recommend semi-annual imaging followup by CTA or MRA. This recommendation follows 2010 ACCF/AHA/AATS/ACR/ASA/SCA/SCAI/SIR/STS/SVM Guidelines for the Diagnosis and Management of Patients With Thoracic Aortic Disease. Circulation. 2010; 121: V893-Y101 No other focal abnormality is seen. Electronically Signed   By: Inez Catalina M.D.   On: 03/21/2016 14:35   Ct Angio Chest Aorta W/cm &/or Wo/cm  02/09/2015  CLINICAL DATA:  Aneurysmal disease of the ascending thoracic aorta. EXAM: CT ANGIOGRAPHY CHEST WITH CONTRAST TECHNIQUE: Multidetector CT imaging of the chest was performed using the standard protocol during bolus administration of intravenous contrast. Multiplanar CT image reconstructions and MIPs were obtained to evaluate the vascular anatomy. CONTRAST:  100 mL Isovue 370 IV COMPARISON:  01/06/2014, 05/06/2013 and 05/11/2012 FINDINGS: Stable aneurysmal disease of the ascending thoracic aorta measures 4.9 cm in greatest diameter. This is stable dating back to 2014. The aorta measures 4.4 cm at the sinuses of Valsalva. The proximal arch measures 3.7 cm. The distal arch measures 3.1 cm. The descending thoracic aorta measures 2.8 cm. No evidence of aortic dissection. Proximal great vessels show stable and normal patency. The innominate artery is tortuous. Stable cardiac enlargement. No pleural or pericardial fluid identified. Lungs show no evidence of edema, infiltrate or nodule. No enlarged lymph nodes are seen. Bony structures are unremarkable. Review of the MIP images confirms the above findings. IMPRESSION: Stable aneurysmal disease of the ascending thoracic aorta  measuring 4.9 cm in greatest diameter. Electronically Signed   By: Aletta Edouard M.D.   On: 02/09/2015 10:32   On my measurement appears stable in size  I have independently reviewed the above radiology studies  and reviewed the findings with the patient.     Ct Angio Chest Aorta W/cm &/or Wo/cm  01/06/2014   CLINICAL DATA:  Followup ascending thoracic aortic aneurysm.  EXAM: CT ANGIOGRAPHY CHEST WITH CONTRAST  TECHNIQUE: Multidetector CT imaging of the chest was performed using the standard protocol during bolus administration of intravenous contrast. Multiplanar CT image reconstructions and MIPs were obtained to evaluate the vascular anatomy.  CONTRAST:  88mL OMNIPAQUE IOHEXOL 350 MG/ML SOLN  COMPARISON:  05/06/2013.  FINDINGS: The maximum aortic diameter at the sinus of Valsalva, sino-tubular junction and ascending thoracic aorta are 4.0 cm, 4.6 cm and 4.4 cm respectively.  These were previously 4.5 cm, 4.9 cm and 4.6 cm respectively, measured at the same levels.  The degree of dilatation of the left atrium is unchanged. Minimal residual atelectasis or scarring at both lung bases. No lung nodules or enlarged lymph nodes. Stable mild narrowing of the origin of the celiac axis. Mild thoracic spine degenerative changes.  Review of the MIP images confirms the above findings.  IMPRESSION: Interval mild decrease in size of the previously demonstrated dilated aortic root, sino-tubular junction and ascending thoracic aorta. The maximum diameter today is 4.6 cm at the sino-tubular junction.   Electronically Signed   By: Enrique Sack M.D.   On: 01/06/2014 11:56     Ct Angio Chest Aorta W/cm &/or Wo/cm  05/06/2013   CLINICAL DATA:  Aortic root evaluation  EXAM: CT ANGIOGRAPHY CHEST WITH CONTRAST  TECHNIQUE: Multidetector CT imaging of the chest was performed using the standard protocol during bolus administration of intravenous contrast. Multiplanar CT image reconstructions and MIPs were obtained to evaluate  the vascular anatomy.  CONTRAST:  42mL OMNIPAQUE IOHEXOL 350 MG/ML SOLN  COMPARISON:  None.  FINDINGS: Maximal aortic diameter is at the sinus of Valsalva, sino-tubular junction, and ascending aorta are 4.5 cm, 4.9 cm, and 4.6 cm respectively. Mild coronary artery calcifications in the circumflex and LAD coronary arteries. Minimal right coronary artery calcifications.  Great vessels are patent. Vertebral arteries are patent within the confines of the exam.  There is mild dilatation of the left atrium. Very minimal peripheral aortic valve calcifications.  Limited images of the abdomen demonstrate mild narrowing at the origin of the celiac axis and patency at the origin of the SMA.  No abnormal mediastinal adenopathy.  No mediastinal mass effect.  Bibasilar atelectasis.  No pneumothorax or pleural effusion. Chronic right upper rib deformities. No definite acute bony deformity. Posterior disc osteophytes at C6-7 with severe disc space narrowing.  Review of the MIP images confirms the above findings.  IMPRESSION: There is significant dilatation of the aortic root as described. The sino-tubular junction is 4.9 cm in diameter. No evidence of dissection. Minimal peripheral aortic valvular calcifications.   Electronically Signed   By: Maryclare Bean M.D.   On: 05/06/2013 16:21   05/11/2012 CT of Chest: CT ANGIOGRAPHY CHEST Comparison: CT scan of June 20, 2008. Findings: No significant abnormality seen involving the pulmonary parenchyma. No pleural effusions or pneumothorax are noted. Normal contrast filling of pulmonary arteries is noted. There is no evidence of aortic dissection. Coronary artery calcifications are noted consistent with coronary artery disease. There is mild dilatation of the ascending thoracic aorta is again noted which now measures 5.0 cm which is slightly increased compared to prior exam of 2010. No mass or adenopathy is noted in the mediastinum. Visualized portions of upper abdomen are within  normal limits.  IMPRESSION: No evidence of thoracic aortic dissection is noted. Coronary artery calcifications are noted suggesting coronary artery disease. Aneurysmal dilatation of the ascending thoracic aorta is noted with maximum measured diameter of 5.0 cm which has increased compared to prior exam of 2010. Original Report Authenticated By: Marijo Conception., M.D.    02/2006 CT of Chest Satisfactory contrast-opacification of pulmonary vasculature. Right upper lobe evidence for pulmonary thromboembolism. Ascending aorta measures 4.4 cm transverse by 4.7 cm AP. Descending aorta measures 3.2 cm transverse by 3.1 cm AP. No acute pulmonary infiltrate. No pleural effusion. No adenopathy. Normal cardiac size. 11/02/2007 CT of Chest: Findings: The chest wall is stable. No supraclavicular or axillary adenopathy.  The thyroid gland appears normal.  The heart is borderline enlarged but stable. No pericardial effusion. Stable small scattered mediastinal and hilar lymph nodes.  No change and fusiform aneurysmal dilatation of the ascending aorta with maximal measurements of 4.7 x 4.7 cm. No dissection. The major branch vessels are patent. The esophagus is grossly normal. There is a filling defect and a right lower lobe pulmonary artery consistent with pulmonary embolism. There is tortuosity of the descending thoracic aorta.  Examination of the lung parenchyma demonstrates minimal lingular scarring change. The airspace consolidation has resolved and this may be post pneumonic scarring. No infiltrates, edema or effusions. No interstitial lung disease. No pulmonary masses or nodules.  The upper abdomen is stable. There is a small left adrenal gland nodule which is likely a benign adenoma.  IMPRESSION:  1. Incidental pulmonary embolism is noted in a right lower lobe pulmonary artery. 2. Near complete resolution of left lingular process. There is minimal residual probable post pneumonic  scarring changes. No acute pulmonary findings or pulmonary masses. 3. Stable ascending thoracic aortic aneurysm. 4. Borderline cardiac size, stable. 5. Stable small mediastinal and hilar lymph nodes. 6. Stable left adrenal gland nodule, likely benign adenoma. 7. Stable periportal and celiac axis lymph nodes.  06/20/2008 CT of Chest: Comparison: CT chest without and with contrast 11/02/2007. Findings: Measured in the axial plane at a level approximately 2.5 cm above the aortic valve on series 4, image 30, maximum diameter of the ascending thoracic aorta remains unchanged at 4.7 cm. Tortuous aortic arch and proximal great vessels without significant atherosclerosis. Mild descending thoracic and upper abdominal aortic atherosclerosis. No evidence of aortic dissection. Heart mildly enlarged but stable. No visible coronary artery calcification. No pericardial effusion. Interval resolution of the segmental right lower lobe of pulmonary embolus; no central pulmonary emboli currently.  Stable scattered normal-sized mediastinal and hilar lymph nodes; no new, enlarging, or suspicious lymphadenopathy. Normal thyroid gland. Minimal scarring in the lingula and in the lower lobes. Lungs otherwise clear without localized airspace consolidation, interstitial disease, or nodularity. High resolution expiratory images demonstrate scattered areas of hyperlucency consistent with localized air trapping. Stable approximate 1.1 cm left adrenal adenoma. Normal-sized porta hepatis and celiac axis lymph nodes again noted and unchanged. Remaining visualized upper abdomen unremarkable for the early arterial phase of enhancement. Bone window images again demonstrate mild thoracic spondylosis.    ECHO:  ATTENDING    Loralie Champagne, M.D.  SONOGRAPHER  [place], RDCS  ORDERING     [name], M.D.  REFERRING    [name], M.D.  PERFORMING   [facility],  Outpatient  cc:  ------------------------------------------------------------------- LV EF: 55%  ------------------------------------------------------------------- Indications:      Aortic root aneurysm (I71.9).  ------------------------------------------------------------------- History:   PMH:   Atrial fibrillation.  Risk factors:  OSA. Pulmonary embolus. Aortic root aneurysm. Hypertension.  ------------------------------------------------------------------- Study Conclusions  - Left ventricle: The cavity size was normal. Wall thickness was   increased in a pattern of mild LVH. Systolic function was normal.   The estimated ejection fraction was 55%. Wall motion was normal;   there were no regional wall motion abnormalities. Indeterminant   diastolic function. - Aortic valve: There was no stenosis. There was mild eccentric   regurgitation. - Aorta: Dilated aortic root and ascending aorta. Aortic root   dimension: 42 mm (ED). Ascending aortic diameter: 46 mm (S). - Mitral valve: There was mild regurgitation. - Left atrium: The atrium was moderately dilated. - Right ventricle: The cavity size was normal.  Systolic function   was normal. - Right atrium: The atrium was mildly dilated. - Tricuspid valve: Peak RV-RA gradient (S): 26 mm Hg. - Pulmonary arteries: PA peak pressure: 34 mm Hg (S). - Systemic veins: IVC measured 2.4 cm with > 50% respirophasic   variation, suggesting RA pressure 8 mmHg.  Impressions:  - Normal LV size with EF 55%. Mild LV hypertrophy. Normal RV size   and systolic function. Dilated aortic root and ascending aorta   with mild eccentric aortic insufficiency. Mild mitral   regurgitation.  ------------------------------------------------------------------- Labs, prior tests, procedures, and surgery: Transthoracic echocardiography (10/06/2014).     EF was 50% and PA pressure was 31 (systolic). Aortic root 42mm. Ascending aorta  60mm.  ------------------------------------------------------------------- Study data:  Comparison was made to the study of 10/06/2014.  Study status:  Routine.  Procedure:  The patient reported no pain pre or post test. Transthoracic echocardiography. Image quality was adequate.  Study completion:  There were no complications. Transthoracic echocardiography.  M-mode, complete 2D, spectral Doppler, and color Doppler.  Birthdate:  Patient birthdate: 05-29-1945.  Age:  Patient is 71 yr old.  Sex:  Gender: male. BMI: 30.7 kg/m^2.  Blood pressure:     121/78  Patient status: Outpatient.  Study date:  Study date: 11/16/2015. Study time: 09:35 AM.  Location:  [facility]  -------------------------------------------------------------------  ------------------------------------------------------------------- Left ventricle:  The cavity size was normal. Wall thickness was increased in a pattern of mild LVH. Systolic function was normal. The estimated ejection fraction was 55%. Wall motion was normal; there were no regional wall motion abnormalities. Indeterminant diastolic function.  ------------------------------------------------------------------- Aortic valve:   Trileaflet.  Doppler:   There was no stenosis. There was mild eccentric regurgitation.  ------------------------------------------------------------------- Aorta:  Dilated aortic root and ascending aorta.  ------------------------------------------------------------------- Mitral valve:   Structurally normal valve.   Mobility was not restricted.  Doppler:  Transvalvular velocity was within the normal range. There was no evidence for stenosis. There was mild regurgitation.  ------------------------------------------------------------------- Left atrium:  The atrium was moderately dilated.  ------------------------------------------------------------------- Right ventricle:  The cavity size was normal. Systolic  function was normal.  ------------------------------------------------------------------- Pulmonic valve:    Structurally normal valve.   Cusp separation was normal.  Doppler:  Transvalvular velocity was within the normal range. There was no evidence for stenosis. There was no regurgitation.  ------------------------------------------------------------------- Tricuspid valve:   Structurally normal valve.    Doppler: Transvalvular velocity was within the normal range. There was trivial regurgitation.  ------------------------------------------------------------------- Right atrium:  The atrium was mildly dilated.  ------------------------------------------------------------------- Pericardium:  There was no pericardial effusion.  ------------------------------------------------------------------- Systemic veins:  IVC measured 2.4 cm with > 50% respirophasic variation, suggesting RA pressure 8 mmHg.  ------------------------------------------------------------------- Post procedure conclusions Ascending Aorta:  - Dilated aortic root and ascending aorta.  ------------------------------------------------------------------- Measurements   Left ventricle                           Value        Reference  LV ID, ED, PLAX chordal                  51.4  mm     43 - 52  LV ID, ES, PLAX chordal                  36.7  mm     23 - 38  LV fx shortening, PLAX chordal  29    %      >=29  LV PW thickness, ED                      11.6  mm     ---------  IVS/LV PW ratio, ED                      1.1          <=1.3  Stroke volume, 2D                        91    ml     ---------  Stroke volume/bsa, 2D                    41    ml/m^2 ---------    Ventricular septum                       Value        Reference  IVS thickness, ED                        12.8  mm     ---------    LVOT                                     Value        Reference  LVOT ID, S                                25    mm     ---------  LVOT area                                4.91  cm^2   ---------  LVOT peak velocity, S                    87.1  cm/s   ---------  LVOT mean velocity, S                    60.3  cm/s   ---------  LVOT VTI, S                              18.6  cm     ---------    Aortic valve                             Value        Reference  Aortic regurg pressure half-time         738   ms     ---------    Aorta                                    Value        Reference  Aortic root ID, ED                       42    mm     ---------  Ascending aorta ID, A-P, S               46    mm     ---------    Left atrium                              Value        Reference  LA ID, A-P, ES                           46    mm     ---------  LA ID/bsa, A-P                           2.08  cm/m^2 <=2.2  LA volume, S                             92.7  ml     ---------  LA volume/bsa, S                         41.8  ml/m^2 ---------  LA volume, ES, 1-p A4C                   86.6  ml     ---------  LA volume/bsa, ES, 1-p A4C               39.1  ml/m^2 ---------  LA volume, ES, 1-p A2C                   88.1  ml     ---------  LA volume/bsa, ES, 1-p A2C               39.8  ml/m^2 ---------    Pulmonary arteries                       Value        Reference  PA pressure, S, DP               (H)     34    mm Hg  <=30    Tricuspid valve                          Value        Reference  Tricuspid regurg peak velocity           255   cm/s   ---------  Tricuspid peak RV-RA gradient            26    mm Hg  ---------  Legend: (L)  and  (H)  mark values outside specified reference range.  ------------------------------------------------------------------- Prepared and Electronically Authenticated by  Loralie Champagne, M.D. 2017-11-09T16:52:00      LV EF: 45% -  50%  ------------------------------------------------------------------- Indications:   Atrial fibrillation  (I48.91).  ------------------------------------------------------------------- History:  PMH:  Atrial fibrillation. Risk factors: Aortic insufficiency. OSA. Aortic root aneurysm. H/o pulmonary embolus.  ------------------------------------------------------------------- Study Conclusions  - Left ventricle: The cavity size was mildly dilated. There was mild concentric hypertrophy. Systolic function was mildly reduced. The estimated ejection fraction was in the range of 45% to 50%. Wall motion was normal; there were no regional wall motion abnormalities. - Aortic valve: There  was mild to moderate regurgitation. - Aorta: Aortic root dimension: 39 mm (ED). Ascending aortic diameter: 44 mm (S). - Aortic root: The aortic root was mildly dilated. - Ascending aorta: The ascending aorta was moderately dilated; it was unchanged fromthe study of 06/24/2013. - Mitral valve: There was mild regurgitation. - Left atrium: The atrium was severely dilated. - Right ventricle: The cavity size was mildly dilated. Wall thickness was normal. Systolic function was normal. - Right atrium: The atrium was mildly dilated. - Tricuspid valve: There was trivial regurgitation. - Pulmonary arteries: Systolic pressure was mildly increased. PA peak pressure: 31 mm Hg (S). - Inferior vena cava: The vessel was dilated. The respirophasic diameter changes were blunted (< 50%).  ------------------------------------------------------------------- Labs, prior tests, procedures, and surgery: Transthoracic echocardiography (06/24/2013).   EF was 55%.  Echocardiography. M-mode, complete 2D, spectral Doppler, and color Doppler. Birthdate: Patient birthdate: Jan 14, 1945. Age: Patient is 71 yr old. Sex: Gender: male.  BMI: 30.4 kg/m^2. Blood pressure:   133/82 Patient status: Outpatient. Study date: Study date: 10/06/2014. Study time: 07:26 AM. Location: [facility]  3  -------------------------------------------------------------------  ------------------------------------------------------------------- Left ventricle: The cavity size was mildly dilated. There was mild concentric hypertrophy. Systolic function was mildly reduced. The estimated ejection fraction was in the range of 45% to 50%. Wall motion was normal; there were no regional wall motion abnormalities. The study was not technically sufficient to allow evaluation of LV diastolic dysfunction due to atrial fibrillation.  ------------------------------------------------------------------- Aortic valve:  Trileaflet; normal thickness leaflets. Mobility was not restricted. Doppler: Transvalvular velocity was within the normal range. There was no stenosis. There was mild to moderate regurgitation.  ------------------------------------------------------------------- Aorta: Aortic root: The aortic root was mildly dilated. Ascending aorta: The ascending aorta was moderately dilated; it was unchanged fromthe study of 06/24/2013.  ------------------------------------------------------------------- Mitral valve:  Structurally normal valve.  Mobility was not restricted. Doppler: Transvalvular velocity was within the normal range. There was no evidence for stenosis. There was mild regurgitation.  ------------------------------------------------------------------- Left atrium: The atrium was severely dilated.  ------------------------------------------------------------------- Right ventricle: The cavity size was mildly dilated. Wall thickness was normal. Systolic function was normal.  ------------------------------------------------------------------- Pulmonic valve:  Structurally normal valve.  Cusp separation was normal. Doppler: Transvalvular velocity was within the normal range. There was no evidence for stenosis. There was  no regurgitation.  ------------------------------------------------------------------- Tricuspid valve:  Structurally normal valve.  Doppler: Transvalvular velocity was within the normal range. There was trivial regurgitation.  ------------------------------------------------------------------- Pulmonary artery:  The main pulmonary artery was normal-sized. Systolic pressure was mildly increased.  ------------------------------------------------------------------- Right atrium: The atrium was mildly dilated.  ------------------------------------------------------------------- Pericardium: There was no pericardial effusion.  ------------------------------------------------------------------- Systemic veins: Inferior vena cava: The vessel was dilated. The respirophasic diameter changes were blunted (< 50%). Proximal diameter during inspiration: 16.07 mm. Proximal diameter during expiration: 29.56 mm. Respirophasic change in diameter: 45.64%.  ------------------------------------------------------------------- Measurements  IVC                   Value    Reference ID, proximal, insp            16.07 mm   --------- ID, proximal, exp            29.56 mm   --------- Collapse                 45.64 %   ---------  Left ventricle              Value    Reference LV ID, ED, PLAX  chordal     (H)   59  mm   43 - 52 LV ID, ES, PLAX chordal     (H)   44  mm   23 - 38 LV fx shortening, PLAX chordal  (L)   25  %   >=29 LV PW thickness, ED           11  mm   --------- IVS/LV PW ratio, ED           1      <=1.3 Stroke volume, 2D            102  ml   --------- Stroke volume/bsa, 2D          46  ml/m^2 ---------  Ventricular septum            Value    Reference IVS thickness, ED             11  mm   ---------  LVOT                   Value    Reference LVOT ID, S                25  mm   --------- LVOT area                4.91 cm^2  --------- LVOT mean velocity, S          62.2 cm/s  --------- LVOT VTI, S               20.8 cm   ---------  Aortic valve               Value    Reference Aortic regurg pressure half-time     488  ms   ---------  Aorta                  Value    Reference Aortic root ID, ED            39  mm   --------- Ascending aorta ID, A-P, S        44  mm   ---------  Left atrium               Value    Reference LA ID, A-P, ES              46  mm   --------- LA ID/bsa, A-P              2.09 cm/m^2 <=2.2 LA volume, S               124  ml   --------- LA volume/bsa, S             56.2 ml/m^2 --------- LA volume, ES, 1-p A4C          112  ml   --------- LA volume/bsa, ES, 1-p A4C        50.8 ml/m^2 --------- LA volume, ES, 1-p A2C          131  ml   --------- LA volume/bsa, ES, 1-p A2C        59.4 ml/m^2 ---------  Pulmonary arteries            Value    Reference PA pressure, S, DP        (H)   31  mm Hg <=30  Tricuspid valve  Value    Reference Tricuspid regurg peak velocity      240  cm/s  --------- Tricuspid peak RV-RA gradient      23  mm Hg ---------  Systemic veins              Value    Reference Estimated CVP              8   mm Hg ---------  Right ventricle             Value    Reference RV pressure, S, DP        (H)   31  mm Hg <=30  Legend: (L) and (H) mark values outside  specified reference range.  ------------------------------------------------------------------- Prepared and Electronically Authenticated by  Skeet Latch, MD 2016-09-29T08:49:00    PFT's: FEV1 2.08  61% +10% post treatment  DLCO 115 %    Assessment / Plan:     1)  Dilated ascending aorta, chronic 4.7 cm in 2008 now 4.9 cm today  ,                    Aortic Size Index=    4.9     /Body surface area is 2.27 meters squared. = 2.15                                                      < 2.75 cm/m2      4% risk per year                                                       2.75 to 4.25          8% risk per year                                                      > 4.25 cm/m2    20% risk per year  #2 valvular heart disease by echo report: Trileaflet aortic valve  Currently the degree of aortic insufficiency is  mild  there also mild left ventricular dilatation (LVEDD = 51  #3 chronic atrial fibrillation: now on anticoagulation #4 extensive coronary artery calcification on CT scan, positive family history of early coronary artery disease, no previous history of ischemic events, no current symptoms #5 history of pulmonary emboli x2, no history of workup for hypercoagulable state  #6 obstructive sleep apnea/Moderaltly Severe Airflow limitation #7 hyperlipidemia  #8 benign prostatic hypertrophy #9 history of hypertension, he is currently on a beta blocker  I have reviewed with him the most recent CT scan and recommended that we repeat surveillance of the size of his aorta with a CT of the chest in one year  With follow-up echocardiogram also.  I reviewed with him the signs and symptoms of aortic dissection,  We discussed proceeding with elective aortic replacement with aorta in the vicinity of 5.5 cm orthopedic develops worsening aortic insufficiency and requires aortic valve replacement. Patient remains asymptomatic but he does  have some slight increase in size of the end diastolic LV  dimension.  He has a follow-up appointment with Dr. Meda Coffee .   Grace Isaac MD      [address] [place] [address] Office [phone]   Beeper [phone]  03/21/2016 3:23 PM

## 2016-03-25 DIAGNOSIS — I4891 Unspecified atrial fibrillation: Secondary | ICD-10-CM | POA: Diagnosis not present

## 2016-03-25 DIAGNOSIS — I10 Essential (primary) hypertension: Secondary | ICD-10-CM | POA: Diagnosis not present

## 2016-04-03 DIAGNOSIS — M1711 Unilateral primary osteoarthritis, right knee: Secondary | ICD-10-CM | POA: Diagnosis not present

## 2016-06-26 DIAGNOSIS — I10 Essential (primary) hypertension: Secondary | ICD-10-CM | POA: Diagnosis not present

## 2016-06-26 DIAGNOSIS — M1711 Unilateral primary osteoarthritis, right knee: Secondary | ICD-10-CM | POA: Diagnosis not present

## 2016-08-21 ENCOUNTER — Encounter: Payer: Self-pay | Admitting: Cardiology

## 2016-09-11 ENCOUNTER — Encounter: Payer: Self-pay | Admitting: Cardiology

## 2016-09-11 ENCOUNTER — Telehealth: Payer: Self-pay | Admitting: *Deleted

## 2016-09-11 ENCOUNTER — Ambulatory Visit (INDEPENDENT_AMBULATORY_CARE_PROVIDER_SITE_OTHER): Payer: PPO | Admitting: Cardiology

## 2016-09-11 VITALS — BP 126/62 | HR 90 | Ht 70.0 in | Wt 219.0 lb

## 2016-09-11 DIAGNOSIS — I351 Nonrheumatic aortic (valve) insufficiency: Secondary | ICD-10-CM

## 2016-09-11 DIAGNOSIS — I429 Cardiomyopathy, unspecified: Secondary | ICD-10-CM

## 2016-09-11 DIAGNOSIS — I7121 Aneurysm of the ascending aorta, without rupture: Secondary | ICD-10-CM

## 2016-09-11 DIAGNOSIS — I712 Thoracic aortic aneurysm, without rupture: Secondary | ICD-10-CM

## 2016-09-11 DIAGNOSIS — I2510 Atherosclerotic heart disease of native coronary artery without angina pectoris: Secondary | ICD-10-CM

## 2016-09-11 DIAGNOSIS — I10 Essential (primary) hypertension: Secondary | ICD-10-CM | POA: Diagnosis not present

## 2016-09-11 HISTORY — DX: Cardiomyopathy, unspecified: I42.9

## 2016-09-11 HISTORY — DX: Aneurysm of the ascending aorta, without rupture: I71.21

## 2016-09-11 NOTE — Telephone Encounter (Signed)
Order for BMET placed for pts lab appt scheduled on 02/03/17, prior to his CT Angio Chest W/WO contrast, that will be done on 02/11/17.  This is per protocol.

## 2016-09-11 NOTE — Progress Notes (Signed)
Patient ID: PRIMO INNIS, male   DOB: 02/15/1945, 71 y.o.   MRN: [id number]    Patient Name: Aaron Richmond Date of Encounter: 09/11/2016  Primary Care Provider:  [name], MD Primary Cardiologist:  [name]  Patient Profile  Atrial fibrillation, aortic regurgitation, ascending aortic aneurysm.  Problem List   Past Medical History:  Diagnosis Date  . Aortic regurgitation   . Aortic root aneurysm ([place])   . Atrial fibrillation ([place])   . BPH (benign prostatic hyperplasia)   . Hyperlipidemia   . Hypertension   . Obesity   . Pulmonary embolism ([place])   . Rhinitis   . Sleep apnea    CPAP   Past Surgical History:  Procedure Laterality Date  . APPENDECTOMY    . HERNIA REPAIR    . TONSILLECTOMY     Allergies  Allergies  Allergen Reactions  . Statins Other (See Comments)    MYALGIAS   HPI  71 year old with h/o hypertension and hyperlipidemia, persistent atrial fibrillation, h/o PE, known OSA on CPAP with known dilated ascending aorta noted on CT in 2008 measured at 4.7 cm and 5 cm in May 2014. The patient was referred to [facility] for preop visit prior to the ear surgery. He was scheduled for a stress echo that was not performed as there was a concern about dilated root and use of Dobutamine. Patient's brother had a myocardial infarction at age 37.   11/16/2015 - this is one year follow-up, patient states that he has no chest pain no dyspnea on exertion, his symptoms haven't changed all in the last year denies any palpitations or syncope. He has been compliant with his medications.   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 He denies any lower extremity edema orthopnea or proximal nocturnal  dyspnea he has been compliant with his medications and has no side effects no bleeding with Xarelto.   Home Medications  Prior to Admission medications   Medication Sig Start Date End Date Taking? Authorizing Provider  aspirin 81 MG tablet Take 81 mg by mouth daily. 2 tablets daily   Yes Historical Provider, MD  b complex vitamins tablet Take 1 tablet by mouth daily.   Yes Historical Provider, MD  Chelated Zinc 50 MG TABS Take 1 tablet by mouth daily.   Yes Historical Provider, MD  finasteride (PROSCAR) 5 MG tablet Take 5 mg by mouth daily.   Yes Historical Provider, MD  fish oil-omega-3 fatty acids 1000 MG capsule Take 1,200 g by mouth daily.   Yes Historical Provider, MD  folic acid (FOLVITE) 175 MCG tablet Take 800 mcg by mouth daily.   Yes Historical Provider, MD  hydrochlorothiazide (HYDRODIURIL) 25 MG tablet Take 25 mg by mouth daily.   Yes Historical Provider, MD  Inositol Niacinate (NO FLUSH NIACIN) 100 MG TABS Take 1 tablet by mouth 2 (two) times daily.   Yes Historical Provider, MD  meloxicam (MOBIC) 7.5 MG tablet Take 7.5 mg by mouth daily.   Yes Historical Provider, MD  metoprolol tartrate (LOPRESSOR) 25 MG tablet Take 25 mg by mouth daily.   Yes Historical Provider, MD  Multiple Vitamins-Minerals (MENS MULTIVITAMIN PLUS PO) Take 1 tablet by mouth daily.  Yes Historical Provider, MD  Nettle, Urtica Dioica, (NETTLE LEAF PO) Take 10 mg by mouth daily.   Yes Historical Provider, MD  Potassium 99 MG TABS Take 1 tablet by mouth daily.   Yes Historical Provider, MD  ranitidine (ZANTAC) 75 MG tablet Take 75 mg by mouth daily.   Yes Historical Provider, MD  Red Yeast Rice Extract (RED YEAST RICE PO) Take 800 mg by mouth 2 (two) times daily.   Yes Historical Provider, MD  tamsulosin (FLOMAX) 0.4 MG CAPS capsule Take 0.4 mg by mouth daily after supper.   Yes Historical Provider, MD  UNABLE TO FIND Take 2 tablets by mouth daily. Med Name:Curamin   Yes Historical Provider, MD    Family  History  Family History  Problem Relation Age of Onset  . Stroke Mother   . Alzheimer's disease Mother   . Heart attack Mother   . Stroke Father   . Diabetes Mellitus II Father     Social History  Social History   Social History  . Marital status: Married    Spouse name: N/A  . Number of children: 0  . Years of education: N/A   Occupational History  . Engineering geologist    Social History Main Topics  . Smoking status: Never Smoker  . Smokeless tobacco: Never Used  . Alcohol use No  . Drug use: No  . Sexual activity: Not on file   Other Topics Concern  . Not on file   Social History Narrative  . No narrative on file     Review of Systems General:  No chills, fever, night sweats or weight changes.  Cardiovascular:  No chest pain, dyspnea on exertion, edema, orthopnea, palpitations, paroxysmal nocturnal dyspnea. Dermatological: No rash, lesions/masses Respiratory: No cough, dyspnea Urologic: No hematuria, dysuria Abdominal:   No nausea, vomiting, diarrhea, bright red blood per rectum, melena, or hematemesis Neurologic:  No visual changes, wkns, changes in mental status. All other systems reviewed and are otherwise negative except as noted above.  Physical Exam  Blood pressure 126/62, pulse 90, height 5\' 10"  (1.778 m), weight 219 lb (99.3 kg).  General: Pleasant, NAD Psych: Normal affect. Neuro: Alert and oriented X 3. Moves all extremities spontaneously. HEENT: Normal  Neck: Supple without bruits or JVD. Lungs:  Resp regular and unlabored, CTA. Heart: RRR no s3, s4, 2/6 diastolic murmur. Abdomen: Soft, non-tender, non-distended, BS + x 4.  Extremities: No clubbing, cyanosis or edema. DP/PT/Radials 2+ and equal bilaterally.  Accessory Clinical Findings  ECG - 09/11/2016, personally reviewed, atrial fibrillation ventricular rate 90 bpm unchanged from prior.  TTE: 11/2015 - Left ventricle: The cavity size was normal. Wall thickness was   increased in a  pattern of mild LVH. Systolic function was normal.   The estimated ejection fraction was 55%. Wall motion was normal;   there were no regional wall motion abnormalities. Indeterminant   diastolic function. - Aortic valve: There was no stenosis. There was mild eccentric   regurgitation. - Aorta: Dilated aortic root and ascending aorta. Aortic root   dimension: 42 mm (ED). Ascending aortic diameter: 46 mm (S). - Mitral valve: There was mild regurgitation. - Left atrium: The atrium was moderately dilated. - Right ventricle: The cavity size was normal. Systolic function   was normal. - Right atrium: The atrium was mildly dilated. - Tricuspid valve: Peak RV-RA gradient (S): 26 mm Hg. - Pulmonary arteries: PA peak pressure: 34 mm Hg (S). - Systemic veins: IVC measured 2.4 cm with >  50% respirophasic   variation, suggesting RA pressure 8 mmHg.   Assessment & Plan  1) Dilated ascending aorta, chronic 4.7 cm in 2008, stable in April 2015, In March 2018 CTA showed ascending aorta measured 49 mm, we will repeat in 02/2016, he is followed by Dr [name] annually and he doesn't want to operate unless diameter reaches 55 mm or there is any other indication. The most recent echocardiogram from November 2017 showed moderate aortic regurgitation, normal LV size, mild concentric LVH LVEF 55%. We will repeat echocardiogram in 02/2017.  2. CAD - The patient has extensive coronary artery calcification on CT scan, positive family history of early coronary artery disease. However, the patient is acting completely asymptomatic, he has no signs of angina or heart failure and doesn't require any ischemic workup at this point.  3. Chronic atrial fibrillation, CHA2DS2-VASc score 2. Continue Xarelto, no bleeding.  4. Hyperlipidemia - statin intolerance, but controlled on niacin, red yeast rice and diet  5. Hypertension - controlled on current regimen.  Follow up in 6 months. Chest CTA and echocardiogram scheduled  for February 2019.  [name], MD 09/11/2016, 11:57 AM

## 2016-09-11 NOTE — Patient Instructions (Addendum)
Medication Instructions:   Your physician recommends that you continue on your current medications as directed. Please refer to the Current Medication list given to you today.    Testing/Procedures:  Your physician has requested that you have an echocardiogram. Echocardiography is a painless test that uses sound waves to create images of your heart. It provides your doctor with information about the size and shape of your heart and how well your heart's chambers and valves are working. This procedure takes approximately one hour. There are no restrictions for this procedure. PER DR NELSON HIS ECHO NEEDS TO BE SCHEDULED FOR February 2019     CHEST CT ANGIO WITH/WITHOUT CONTRAST---PER DR NELSON THIS NEEDS TO BE SCHEDULED FOR February 2019      Follow-Up:  Your physician wants you to follow-up in: [place] will receive a reminder letter in the mail two months in advance. If you don't receive a letter, please call our office to schedule the follow-up appointment.        If you need a refill on your cardiac medications before your next appointment, please call your pharmacy.

## 2016-10-01 DIAGNOSIS — M1711 Unilateral primary osteoarthritis, right knee: Secondary | ICD-10-CM | POA: Diagnosis not present

## 2016-10-02 DIAGNOSIS — I4891 Unspecified atrial fibrillation: Secondary | ICD-10-CM | POA: Diagnosis not present

## 2016-10-02 DIAGNOSIS — Z0000 Encounter for general adult medical examination without abnormal findings: Secondary | ICD-10-CM | POA: Diagnosis not present

## 2016-10-02 DIAGNOSIS — N400 Enlarged prostate without lower urinary tract symptoms: Secondary | ICD-10-CM | POA: Diagnosis not present

## 2016-10-02 DIAGNOSIS — I10 Essential (primary) hypertension: Secondary | ICD-10-CM | POA: Diagnosis not present

## 2016-10-02 DIAGNOSIS — E7800 Pure hypercholesterolemia, unspecified: Secondary | ICD-10-CM | POA: Diagnosis not present

## 2016-10-02 DIAGNOSIS — Z1389 Encounter for screening for other disorder: Secondary | ICD-10-CM | POA: Diagnosis not present

## 2016-11-27 ENCOUNTER — Other Ambulatory Visit: Payer: Self-pay | Admitting: Cardiology

## 2017-01-02 DIAGNOSIS — M1711 Unilateral primary osteoarthritis, right knee: Secondary | ICD-10-CM | POA: Diagnosis not present

## 2017-01-15 DIAGNOSIS — E669 Obesity, unspecified: Secondary | ICD-10-CM | POA: Diagnosis not present

## 2017-01-15 DIAGNOSIS — Z6832 Body mass index (BMI) 32.0-32.9, adult: Secondary | ICD-10-CM | POA: Diagnosis not present

## 2017-01-15 DIAGNOSIS — I10 Essential (primary) hypertension: Secondary | ICD-10-CM | POA: Diagnosis not present

## 2017-01-29 ENCOUNTER — Other Ambulatory Visit: Payer: Self-pay | Admitting: Radiology

## 2017-01-29 ENCOUNTER — Telehealth: Payer: Self-pay | Admitting: *Deleted

## 2017-01-29 DIAGNOSIS — I719 Aortic aneurysm of unspecified site, without rupture: Secondary | ICD-10-CM

## 2017-01-29 DIAGNOSIS — I712 Thoracic aortic aneurysm, without rupture: Secondary | ICD-10-CM

## 2017-01-29 DIAGNOSIS — I7121 Aneurysm of the ascending aorta, without rupture: Secondary | ICD-10-CM

## 2017-01-29 NOTE — Telephone Encounter (Signed)
STAT BMET ordered for this pt per CT dept protocol.  Pt will have STAT BMET on same day he has his CT Angio of the chest, on 02/13/17.  Pt advised by CT dept to come in early for STAT lab, prior to his scheduled CT that day at 1 pm.

## 2017-01-29 NOTE — Telephone Encounter (Signed)
-----   Message from Osvaldo Shipper, [place] sent at 01/29/2017 11:17 AM EST ----- Regarding: Labs for CT  Aaron Richmond   Will you make Aaron Richmond labs stat (BMET). Pt wants to come same day to get labs as CT.   MRN [id number]  Thank you   Erline Levine

## 2017-02-03 ENCOUNTER — Other Ambulatory Visit: Payer: PPO

## 2017-02-11 ENCOUNTER — Inpatient Hospital Stay: Admission: RE | Admit: 2017-02-11 | Payer: PPO | Source: Ambulatory Visit

## 2017-02-11 ENCOUNTER — Other Ambulatory Visit (HOSPITAL_COMMUNITY): Payer: PPO

## 2017-02-13 ENCOUNTER — Ambulatory Visit (INDEPENDENT_AMBULATORY_CARE_PROVIDER_SITE_OTHER)
Admission: RE | Admit: 2017-02-13 | Discharge: 2017-02-13 | Disposition: A | Discharge status: Home / Self Care | Payer: PPO | Source: Ambulatory Visit | Attending: Cardiology | Admitting: Cardiology

## 2017-02-13 ENCOUNTER — Other Ambulatory Visit: Payer: PPO | Admitting: *Deleted

## 2017-02-13 ENCOUNTER — Other Ambulatory Visit: Payer: Self-pay

## 2017-02-13 ENCOUNTER — Ambulatory Visit (HOSPITAL_COMMUNITY): Payer: PPO | Attending: Cardiovascular Disease

## 2017-02-13 DIAGNOSIS — I083 Combined rheumatic disorders of mitral, aortic and tricuspid valves: Secondary | ICD-10-CM | POA: Insufficient documentation

## 2017-02-13 DIAGNOSIS — I351 Nonrheumatic aortic (valve) insufficiency: Secondary | ICD-10-CM | POA: Diagnosis not present

## 2017-02-13 DIAGNOSIS — I719 Aortic aneurysm of unspecified site, without rupture: Secondary | ICD-10-CM | POA: Diagnosis not present

## 2017-02-13 DIAGNOSIS — I712 Thoracic aortic aneurysm, without rupture: Secondary | ICD-10-CM

## 2017-02-13 DIAGNOSIS — I429 Cardiomyopathy, unspecified: Secondary | ICD-10-CM | POA: Insufficient documentation

## 2017-02-13 DIAGNOSIS — I7121 Aneurysm of the ascending aorta, without rupture: Secondary | ICD-10-CM

## 2017-02-13 DIAGNOSIS — I714 Abdominal aortic aneurysm, without rupture: Secondary | ICD-10-CM | POA: Diagnosis not present

## 2017-02-13 DIAGNOSIS — I10 Essential (primary) hypertension: Secondary | ICD-10-CM | POA: Diagnosis not present

## 2017-02-13 DIAGNOSIS — E785 Hyperlipidemia, unspecified: Secondary | ICD-10-CM | POA: Insufficient documentation

## 2017-02-13 DIAGNOSIS — G4730 Sleep apnea, unspecified: Secondary | ICD-10-CM | POA: Diagnosis not present

## 2017-02-13 DIAGNOSIS — I700 Atherosclerosis of aorta: Secondary | ICD-10-CM | POA: Diagnosis not present

## 2017-02-13 DIAGNOSIS — I2510 Atherosclerotic heart disease of native coronary artery without angina pectoris: Secondary | ICD-10-CM | POA: Insufficient documentation

## 2017-02-13 LAB — BASIC METABOLIC PANEL
BUN/Creatinine Ratio: 21 (ref 10–24)
BUN: 24 mg/dL (ref 8–27)
CO2: 29 mmol/L (ref 20–29)
Calcium: 9.8 mg/dL (ref 8.6–10.2)
Chloride: 99 mmol/L (ref 96–106)
Creatinine, Ser: 1.13 mg/dL (ref 0.76–1.27)
GFR calc Af Amer: 76 mL/min/{1.73_m2} (ref >59)
GFR calc non Af Amer: 65 mL/min/{1.73_m2} (ref >59)
Glucose: 99 mg/dL (ref 65–99)
Potassium: 4.3 mmol/L (ref 3.5–5.2)
Sodium: 135 mmol/L (ref 134–144)

## 2017-02-13 MED ORDER — IOPAMIDOL (ISOVUE-370) INJECTION 76%
100.0000 mL | Freq: Once | INTRAVENOUS | Status: AC | PRN
  Administered 2017-02-13: 100 mL via INTRAVENOUS

## 2017-02-24 ENCOUNTER — Other Ambulatory Visit: Payer: Self-pay | Admitting: Cardiothoracic Surgery

## 2017-02-24 ENCOUNTER — Other Ambulatory Visit: Payer: Self-pay | Admitting: Cardiology

## 2017-02-24 DIAGNOSIS — I712 Thoracic aortic aneurysm, without rupture, unspecified: Secondary | ICD-10-CM

## 2017-02-24 DIAGNOSIS — I7121 Aneurysm of the ascending aorta, without rupture: Secondary | ICD-10-CM

## 2017-02-25 NOTE — Telephone Encounter (Signed)
Xarelto 20mg  refill request received; pt is 72 yrs old, wt-99.3kg, Crea-1.13 on 02/13/17, Last seen by Dr. Meda Coffee on 09/11/16, CrCl-85.20ml/min. Will send in refill to requested pharmacy.

## 2017-04-02 ENCOUNTER — Encounter: Payer: Self-pay | Admitting: Cardiothoracic Surgery

## 2017-04-02 ENCOUNTER — Ambulatory Visit: Payer: PPO | Admitting: Cardiothoracic Surgery

## 2017-04-02 ENCOUNTER — Ambulatory Visit
Admission: RE | Admit: 2017-04-02 | Discharge: 2017-04-02 | Disposition: A | Discharge status: Home / Self Care | Payer: PPO | Source: Ambulatory Visit | Attending: Cardiothoracic Surgery | Admitting: Cardiothoracic Surgery

## 2017-04-02 VITALS — BP 130/82 | HR 96 | Resp 20 | Ht 70.0 in | Wt 224.0 lb

## 2017-04-02 DIAGNOSIS — I712 Thoracic aortic aneurysm, without rupture, unspecified: Secondary | ICD-10-CM

## 2017-04-02 DIAGNOSIS — I7121 Aneurysm of the ascending aorta, without rupture: Secondary | ICD-10-CM

## 2017-04-02 MED ORDER — IOPAMIDOL (ISOVUE-370) INJECTION 76%
75.0000 mL | Freq: Once | INTRAVENOUS | Status: AC | PRN
  Administered 2017-04-02: 75 mL via INTRAVENOUS

## 2017-04-02 NOTE — Progress Notes (Signed)
[address]       ,[place] [address]             [id number]                        Loran E Decesare [place] Medical Record #[id number] Date of Birth: 1945-06-09  Referring: [name], MD Primary Care: Dr Nona Dell Cardiology : Dr Ottie Glazier  Chief Complaint:    Chief Complaint  Patient presents with  . Thoracic Aortic Aneurysm    1 year f/u with CTA Chest    History of Present Illness:    Patient is a 72 year old referred because of dilated ascending aorta noted on CT scan done at [facility] May 11 2012.  I will follow him since that time with serial CT scans . The  patient notes that in May 2014  he was referred to [facility] for cardiology clearance prior to EAR surgery. He notes that Dr. Jimmie Molly was doing a stress echo on him but never completed it because  a dilated aortic root , moderate aortic regurgitation and  mild to moderate mitral regurgitation was noted . A CT scan of the chest was performed following the echocardiogram 05/11/2012. He notes that he know his aorta was large for "years".   The patient also he has been in atrial fibrillation for several years and has a history of at least 2 pulmonary emboli.  He now sees  Dr. Nelson/cardiology followed   Because of  atrial fibrillation and history of pulmonary emboli and AI . He was started on xarelto. He continues on Xarelto without bleeding difficulty  He denies anginal or exertional chest, is not limited by shortness of breath. He does have known sleep apnea and uses BiPAP at night. He does note some fatigue  continues with good dental care seeing a dentist every 6 months.  He has had no previous history of myocardial infarction, but does have a brother who had a myocardial infarction at age 29. There is no family history of Marfan's or acute aortic dissections.   His echocardiogram demonstrates a trileaflet aortic valve with moderate aortic insufficiency LV dimensions  in 1914 and diastolic 57 mm and systolic 45 for comparison in 2016 it was 59 and 44.  Current Activity/ Functional Status:  Patient is independent with mobility/ambulation, transfers, ADL's, IADL's.  Zubrod Score: At the time of surgery this patient's most appropriate activity status/level should be described as: []  Normal activity, no symptoms [x]  Symptoms, fully ambulatory []  Symptoms, in bed less than or equal to 50% of the time []  Symptoms, in bed greater than 50% of the time but less than 100% []  Bedridden []  Moribund   Past Medical History:  Diagnosis Date  . Aortic regurgitation   . Aortic root aneurysm ([place])   . Atrial fibrillation ([place])   . BPH (benign prostatic hyperplasia)   . Hyperlipidemia   . Hypertension   . Obesity   . Pulmonary embolism ([place])   . Rhinitis   . Sleep apnea    CPAP    Past Surgical History:  Procedure Laterality Date  . APPENDECTOMY    . HERNIA REPAIR    . TONSILLECTOMY      Family History  Problem Relation Age of Onset  . Stroke Mother   . Alzheimer's disease Mother   . Heart attack Mother   . Stroke Father   . Diabetes Mellitus  II Father    No family history of Marfan's or aortic dissection.   Social History   Tobacco Use  Smoking Status Never Smoker  Smokeless Tobacco Never Used    Social History   Substance and Sexual Activity  Alcohol Use No     Allergies  Allergen Reactions  . Statins Other (See Comments)    MYALGIAS    Current Outpatient Medications  Medication Sig Dispense Refill  . b complex vitamins tablet Take 1 tablet by mouth daily.    . Chelated Zinc 50 MG TABS Take 1 tablet by mouth daily.    . finasteride (PROSCAR) 5 MG tablet Take 5 mg by mouth daily.    . fish oil-omega-3 fatty acids 1000 MG capsule Take 1,200 g by mouth daily.    . folic acid (FOLVITE) 016 MCG tablet Take 800 mcg by mouth daily.    . hydrochlorothiazide (HYDRODIURIL) 25 MG tablet TAKE ONE TABLET BY MOUTH DAILY 90 tablet 2  .  Inositol Niacinate (NO FLUSH NIACIN) 100 MG TABS Take 1 tablet by mouth 2 (two) times daily.    . meloxicam (MOBIC) 7.5 MG tablet Take 7.5 mg by mouth daily.    . metoprolol tartrate (LOPRESSOR) 25 MG tablet TAKE ONE TABLET BY MOUTH TWICE DAILY 180 tablet 2  . Multiple Vitamins-Minerals (MENS MULTIVITAMIN PLUS PO) Take 1 tablet by mouth daily.    . Potassium 99 MG TABS Take 1 tablet by mouth daily.    . ranitidine (ZANTAC) 75 MG tablet Take 75 mg by mouth daily.    . Red Yeast Rice Extract (RED YEAST RICE PO) Take 800 mg by mouth 2 (two) times daily.    . tamsulosin (FLOMAX) 0.4 MG CAPS capsule Take 0.4 mg by mouth daily after supper.    Marland Kitchen UNABLE TO FIND Take 8 tablets by mouth daily. Med Name:Curamin    . XARELTO 20 MG TABS tablet TAKE 1 TABLET BY MOUTH ONCE DAILY WITH SUPPER 30 tablet 11   No current facility-administered medications for this visit.        Review of Systems:    Review of Systems  Constitutional: Negative for chills, fever, malaise/fatigue and weight loss.  HENT: Negative.   Eyes: Negative.   Respiratory: Negative for cough, hemoptysis, sputum production, shortness of breath and wheezing.   Cardiovascular: Positive for palpitations. Negative for chest pain, orthopnea, claudication, leg swelling and PND.  Gastrointestinal: Negative.   Genitourinary: Negative.   Musculoskeletal: Positive for joint pain. Negative for back pain, falls, myalgias and neck pain.  Skin: Negative.   Neurological: Negative.   Endo/Heme/Allergies: Negative for environmental allergies and polydipsia. Bruises/bleeds easily.  Psychiatric/Behavioral: Negative.      Immunizations: Flu [ refused  ]; Pneumococcal[refused  ];   Physical Exam: BP 130/82   Pulse 96   Resp 20   Ht 5\' 10"  (1.778 m)   Wt 224 lb (101.6 kg)   SpO2 96% Comment: RA  BMI 32.14 kg/m   General appearance: alert and cooperative Head: Normocephalic, without obvious abnormality, atraumatic Neck: no adenopathy, no  carotid bruit, no JVD, supple, symmetrical, trachea midline and thyroid not enlarged, symmetric, no tenderness/mass/nodules Lymph nodes: Cervical, supraclavicular, and axillary nodes normal. Resp: clear to auscultation bilaterally Back: symmetric, no curvature. ROM normal. No CVA tenderness. Cardio: regular rate and rhythm and diastolic murmur: mid diastolic 2/6, decrescendo at lower left sternal border GI: soft, non-tender; bowel sounds normal; no masses,  no organomegaly Extremities: extremities normal, atraumatic, no cyanosis or edema  and Homans sign is negative, no sign of DVT Neurologic: Grossly normal   Diagnostic Studies & Laboratory data:     Recent Radiology Findings:  Ct Angio Chest Aorta W/cm &/or Wo/cm  Result Date: 04/02/2017 CLINICAL DATA:  Thoracic aortic aneurysm without rupture. EXAM: CT ANGIOGRAPHY CHEST WITH CONTRAST TECHNIQUE: Multidetector CT imaging of the chest was performed using the standard protocol during bolus administration of intravenous contrast. Multiplanar CT image reconstructions and MIPs were obtained to evaluate the vascular anatomy. CONTRAST:  49mL ISOVUE-370 IOPAMIDOL (ISOVUE-370) INJECTION 76% COMPARISON:  CT scan of February 13, 2017 and March 21, 2016. FINDINGS: Cardiovascular: 4.9 cm ascending thoracic aortic aneurysm is noted without dissection. Great vessels are widely patent without significant stenosis. Tortuosity of descending thoracic aorta is noted. Transverse aortic arch measures 2.9 cm. Proximal descending thoracic aorta measures 3.3 cm. Coronary artery calcifications are noted. No pericardial effusion is noted. Mediastinum/Nodes: No enlarged mediastinal, hilar, or axillary lymph nodes. Thyroid gland, trachea, and esophagus demonstrate no significant findings. Lungs/Pleura: Lungs are clear. No pleural effusion or pneumothorax. Upper Abdomen: No acute abnormality. Musculoskeletal: No chest wall abnormality. No acute or significant osseous findings.  Review of the MIP images confirms the above findings. IMPRESSION: 4.9 cm ascending thoracic aortic aneurysm which is not significantly changed compared to prior exam. Recommend semi-annual imaging followup by CTA or MRA and referral to cardiothoracic surgery if not already obtained. This recommendation follows 2010 ACCF/AHA/AATS/ACR/ASA/SCA/SCAI/SIR/STS/SVM Guidelines for the Diagnosis and Management of Patients With Thoracic Aortic Disease. Circulation. 2010; 121: T419-Q222. Coronary artery calcifications are noted suggesting coronary artery disease. Electronically Signed   By: Marijo Conception, M.D.   On: 04/02/2017 15:57      Ct Angio Chest Aorta W/cm &/or Wo/cm  Result Date: 03/21/2016 CLINICAL DATA:  Follow-up aortic root aneurysm EXAM: CT ANGIOGRAPHY CHEST WITH CONTRAST TECHNIQUE: Multidetector CT imaging of the chest was performed using the standard protocol during bolus administration of intravenous contrast. Multiplanar CT image reconstructions and MIPs were obtained to evaluate the vascular anatomy. CONTRAST:  75 mL Isovue 370. Creatinine was obtained on site at [facility] at [address]Results: Creatinine 1.1 mg/dL. COMPARISON:  02/09/2015 FINDINGS: Cardiovascular: There is prominence of the ascending aorta again identified. At the level of the sinus of Valsalva, the aorta measures approximately 4.5 cm. The ascending aorta measures approximately 4.9 cm in greatest dimension stable in appearance from the prior exam. Despite being a gated study there is some persistent motion artifact identified. Mild coronary calcifications are again seen. The aortic arch tapers in a normal fashion. The brachiocephalic vessels are within normal limits with mild atherosclerotic calcifications. The descending thoracic aorta is within normal limits. The pulmonary artery is incompletely evaluated although no large central pulmonary embolus is noted. Mediastinum/Nodes: The thoracic inlet is within normal  limits. No significant hilar or mediastinal adenopathy is noted. A few scattered small lymph nodes are noted stable from the prior exam. Lungs/Pleura: Lungs are well aerated bilaterally without focal infiltrate or sizable parenchymal nodule. No effusion is seen. Upper Abdomen: Visualized upper abdomen is within normal limits. Musculoskeletal: No acute bony abnormality is noted. Review of the MIP images confirms the above findings. IMPRESSION: Stable appearance of the ascending aorta dilated to 4.9 cm in maximum dimension. Ascending thoracic aortic aneurysm. Recommend semi-annual imaging followup by CTA or MRA. This recommendation follows 2010 ACCF/AHA/AATS/ACR/ASA/SCA/SCAI/SIR/STS/SVM Guidelines for the Diagnosis and Management of Patients With Thoracic Aortic Disease. Circulation. 2010; 121: L798-X211 No other focal abnormality is seen. Electronically Signed  By: Inez Catalina M.D.   On: 03/21/2016 14:35   Ct Angio Chest Aorta W/cm &/or Wo/cm  02/09/2015  CLINICAL DATA:  Aneurysmal disease of the ascending thoracic aorta. EXAM: CT ANGIOGRAPHY CHEST WITH CONTRAST TECHNIQUE: Multidetector CT imaging of the chest was performed using the standard protocol during bolus administration of intravenous contrast. Multiplanar CT image reconstructions and MIPs were obtained to evaluate the vascular anatomy. CONTRAST:  100 mL Isovue 370 IV COMPARISON:  01/06/2014, 05/06/2013 and 05/11/2012 FINDINGS: Stable aneurysmal disease of the ascending thoracic aorta measures 4.9 cm in greatest diameter. This is stable dating back to 2014. The aorta measures 4.4 cm at the sinuses of Valsalva. The proximal arch measures 3.7 cm. The distal arch measures 3.1 cm. The descending thoracic aorta measures 2.8 cm. No evidence of aortic dissection. Proximal great vessels show stable and normal patency. The innominate artery is tortuous. Stable cardiac enlargement. No pleural or pericardial fluid identified. Lungs show no evidence of edema,  infiltrate or nodule. No enlarged lymph nodes are seen. Bony structures are unremarkable. Review of the MIP images confirms the above findings. IMPRESSION: Stable aneurysmal disease of the ascending thoracic aorta measuring 4.9 cm in greatest diameter. Electronically Signed   By: Aletta Edouard M.D.   On: 02/09/2015 10:32      Ct Angio Chest Aorta W/cm &/or Wo/cm  01/06/2014   CLINICAL DATA:  Followup ascending thoracic aortic aneurysm.  EXAM: CT ANGIOGRAPHY CHEST WITH CONTRAST  TECHNIQUE: Multidetector CT imaging of the chest was performed using the standard protocol during bolus administration of intravenous contrast. Multiplanar CT image reconstructions and MIPs were obtained to evaluate the vascular anatomy.  CONTRAST:  69mL OMNIPAQUE IOHEXOL 350 MG/ML SOLN  COMPARISON:  05/06/2013.  FINDINGS: The maximum aortic diameter at the sinus of Valsalva, sino-tubular junction and ascending thoracic aorta are 4.0 cm, 4.6 cm and 4.4 cm respectively. These were previously 4.5 cm, 4.9 cm and 4.6 cm respectively, measured at the same levels.  The degree of dilatation of the left atrium is unchanged. Minimal residual atelectasis or scarring at both lung bases. No lung nodules or enlarged lymph nodes. Stable mild narrowing of the origin of the celiac axis. Mild thoracic spine degenerative changes.  Review of the MIP images confirms the above findings.  IMPRESSION: Interval mild decrease in size of the previously demonstrated dilated aortic root, sino-tubular junction and ascending thoracic aorta. The maximum diameter today is 4.6 cm at the sino-tubular junction.   Electronically Signed   By: Enrique Sack M.D.   On: 01/06/2014 11:56     Ct Angio Chest Aorta W/cm &/or Wo/cm  05/06/2013   CLINICAL DATA:  Aortic root evaluation  EXAM: CT ANGIOGRAPHY CHEST WITH CONTRAST  TECHNIQUE: Multidetector CT imaging of the chest was performed using the standard protocol during bolus administration of intravenous contrast.  Multiplanar CT image reconstructions and MIPs were obtained to evaluate the vascular anatomy.  CONTRAST:  3mL OMNIPAQUE IOHEXOL 350 MG/ML SOLN  COMPARISON:  None.  FINDINGS: Maximal aortic diameter is at the sinus of Valsalva, sino-tubular junction, and ascending aorta are 4.5 cm, 4.9 cm, and 4.6 cm respectively. Mild coronary artery calcifications in the circumflex and LAD coronary arteries. Minimal right coronary artery calcifications.  Great vessels are patent. Vertebral arteries are patent within the confines of the exam.  There is mild dilatation of the left atrium. Very minimal peripheral aortic valve calcifications.  Limited images of the abdomen demonstrate mild narrowing at the origin of the celiac axis and  patency at the origin of the SMA.  No abnormal mediastinal adenopathy.  No mediastinal mass effect.  Bibasilar atelectasis.  No pneumothorax or pleural effusion. Chronic right upper rib deformities. No definite acute bony deformity. Posterior disc osteophytes at C6-7 with severe disc space narrowing.  Review of the MIP images confirms the above findings.  IMPRESSION: There is significant dilatation of the aortic root as described. The sino-tubular junction is 4.9 cm in diameter. No evidence of dissection. Minimal peripheral aortic valvular calcifications.   Electronically Signed   By: Maryclare Bean M.D.   On: 05/06/2013 16:21   05/11/2012 CT of Chest: CT ANGIOGRAPHY CHEST Comparison: CT scan of June 20, 2008. Findings: No significant abnormality seen involving the pulmonary parenchyma. No pleural effusions or pneumothorax are noted. Normal contrast filling of pulmonary arteries is noted. There is no evidence of aortic dissection. Coronary artery calcifications are noted consistent with coronary artery disease. There is mild dilatation of the ascending thoracic aorta is again noted which now measures 5.0 cm which is slightly increased compared to prior exam of 2010. No mass or adenopathy is noted  in the mediastinum. Visualized portions of upper abdomen are within normal limits.  IMPRESSION: No evidence of thoracic aortic dissection is noted. Coronary artery calcifications are noted suggesting coronary artery disease. Aneurysmal dilatation of the ascending thoracic aorta is noted with maximum measured diameter of 5.0 cm which has increased compared to prior exam of 2010. Original Report Authenticated By: Marijo Conception., M.D.    02/2006 CT of Chest Satisfactory contrast-opacification of pulmonary vasculature. Right upper lobe evidence for pulmonary thromboembolism. Ascending aorta measures 4.4 cm transverse by 4.7 cm AP. Descending aorta measures 3.2 cm transverse by 3.1 cm AP. No acute pulmonary infiltrate. No pleural effusion. No adenopathy. Normal cardiac size. 11/02/2007 CT of Chest: Findings: The chest wall is stable. No supraclavicular or axillary adenopathy. The thyroid gland appears normal.  The heart is borderline enlarged but stable. No pericardial effusion. Stable small scattered mediastinal and hilar lymph nodes.  No change and fusiform aneurysmal dilatation of the ascending aorta with maximal measurements of 4.7 x 4.7 cm. No dissection. The major branch vessels are patent. The esophagus is grossly normal. There is a filling defect and a right lower lobe pulmonary artery consistent with pulmonary embolism. There is tortuosity of the descending thoracic aorta.  Examination of the lung parenchyma demonstrates minimal lingular scarring change. The airspace consolidation has resolved and this may be post pneumonic scarring. No infiltrates, edema or effusions. No interstitial lung disease. No pulmonary masses or nodules.  The upper abdomen is stable. There is a small left adrenal gland nodule which is likely a benign adenoma.  IMPRESSION:  1. Incidental pulmonary embolism is noted in a right lower lobe pulmonary artery. 2. Near complete resolution of left  lingular process. There is minimal residual probable post pneumonic scarring changes. No acute pulmonary findings or pulmonary masses. 3. Stable ascending thoracic aortic aneurysm. 4. Borderline cardiac size, stable. 5. Stable small mediastinal and hilar lymph nodes. 6. Stable left adrenal gland nodule, likely benign adenoma. 7. Stable periportal and celiac axis lymph nodes.  06/20/2008 CT of Chest: Comparison: CT chest without and with contrast 11/02/2007. Findings: Measured in the axial plane at a level approximately 2.5 cm above the aortic valve on series 4, image 30, maximum diameter of the ascending thoracic aorta remains unchanged at 4.7 cm. Tortuous aortic arch and proximal great vessels without significant atherosclerosis. Mild descending thoracic and upper abdominal aortic atherosclerosis.  No evidence of aortic dissection. Heart mildly enlarged but stable. No visible coronary artery calcification. No pericardial effusion. Interval resolution of the segmental right lower lobe of pulmonary embolus; no central pulmonary emboli currently.  Stable scattered normal-sized mediastinal and hilar lymph nodes; no new, enlarging, or suspicious lymphadenopathy. Normal thyroid gland. Minimal scarring in the lingula and in the lower lobes. Lungs otherwise clear without localized airspace consolidation, interstitial disease, or nodularity. High resolution expiratory images demonstrate scattered areas of hyperlucency consistent with localized air trapping. Stable approximate 1.1 cm left adrenal adenoma. Normal-sized porta hepatis and celiac axis lymph nodes again noted and unchanged. Remaining visualized upper abdomen unremarkable for the early arterial phase of enhancement. Bone window images again demonstrate mild thoracic spondylosis.    ECHO: 02/2017  ------------------------------------------------------------------- Echocardiography  Patient:    Nasim, Habeeb MR #:        [id number] Study Date: 02/13/2017 Gender:     M Age:        77 Height:     177.8 cm Weight:     99.3 kg BSA:        2.24 m^2 Pt. Status: Room:   SONOGRAPHER  Marygrace Drought, RCS  ORDERING     [name], M.D.  REFERRING    [name], M.D.  PERFORMING   [facility], Outpatient  ATTENDING    Skeet Latch, MD  cc:  ------------------------------------------------------------------- LV EF: 50% -   55%  ------------------------------------------------------------------- Indications:      Aortic Valve Disorder (I35.1).  ------------------------------------------------------------------- History:   PMH:  Hyperlipidemia, AAA, sleep apnea.  Coronary artery disease.  Risk factors:  Hypertension.  ------------------------------------------------------------------- Study Conclusions  - Left ventricle: The cavity size was mildly dilated. Wall   thickness was increased in a pattern of moderate LVH. Systolic   function was normal. The estimated ejection fraction was in the   range of 50% to 55%. Wall motion was normal; there were no   regional wall motion abnormalities. Doppler parameters are   consistent with indeterminate ventricular filling pressure. - Aortic valve: Transvalvular velocity was within the normal range.   There was no stenosis. There was moderate regurgitation.   Regurgitation pressure half-time: 425 ms. - Ascending aorta: The ascending aorta was moderately dilated. - Mitral valve: There was mild regurgitation. - Left atrium: The atrium was severely dilated. - Right ventricle: The cavity size was normal. Wall thickness was   normal. Systolic function was normal. - Atrial septum: No defect or patent foramen ovale was identified. - Tricuspid valve: There was mild regurgitation. - Pulmonary arteries: Systolic pressure was within the normal   range. PA peak pressure: 27 mm Hg (S).  ------------------------------------------------------------------- Study  data:  Comparison was made to the study of 11/16/2015.  Study status:  Routine.  Procedure:  The patient reported no pain pre or post test. Transthoracic echocardiography. Image quality was adequate.          Echocardiography.  M-mode, complete 2D, spectral Doppler, and color Doppler.  Birthdate:  Patient birthdate: 06-07-1945.  Age:  Patient is 72 yr old.  Sex:  Gender: male. BMI: 31.4 kg/m^2.  Blood pressure:     132/82  Patient status: Outpatient.  Study date:  Study date: 02/13/2017. Study time: 11:40 AM.  Location:  [facility]  -------------------------------------------------------------------  ------------------------------------------------------------------- Left ventricle:  The cavity size was mildly dilated. Wall thickness was increased in a pattern of moderate LVH. Systolic function was normal. The estimated ejection fraction was in the range of 50% to  55%. Wall motion was normal; there were no regional wall motion abnormalities. The study was not technically sufficient to allow evaluation of LV diastolic dysfunction due to atrial fibrillation. Doppler parameters are consistent with indeterminate ventricular filling pressure.  ------------------------------------------------------------------- Aortic valve:   Trileaflet; normal thickness leaflets. Mobility was not restricted.  Doppler:  Transvalvular velocity was within the normal range. There was no stenosis. There was moderate regurgitation.  ------------------------------------------------------------------- Aorta:  Aortic root: The aortic root was normal in size. Ascending aorta: The ascending aorta was moderately dilated.  ------------------------------------------------------------------- Mitral valve:   Structurally normal valve.   Mobility was not restricted.  Doppler:  Transvalvular velocity was within the normal range. There was no evidence for stenosis. There was mild regurgitation.    Peak  gradient (D): 4 mm Hg.  ------------------------------------------------------------------- Left atrium:  The atrium was severely dilated.  ------------------------------------------------------------------- Atrial septum:  No defect or patent foramen ovale was identified.   ------------------------------------------------------------------- Right ventricle:  The cavity size was normal. Wall thickness was normal. Systolic function was normal.  ------------------------------------------------------------------- Pulmonic valve:    Doppler:  Transvalvular velocity was within the normal range. There was no evidence for stenosis.  ------------------------------------------------------------------- Tricuspid valve:   Structurally normal valve.    Doppler: Transvalvular velocity was within the normal range. There was mild regurgitation.  ------------------------------------------------------------------- Pulmonary artery:   The main pulmonary artery was normal-sized. Systolic pressure was within the normal range.  ------------------------------------------------------------------- Right atrium:  The atrium was normal in size.  ------------------------------------------------------------------- Pericardium:  There was no pericardial effusion.  ------------------------------------------------------------------- Systemic veins: Inferior vena cava: The vessel was normal in size. The respirophasic diameter changes were in the normal range (= 50%), consistent with normal central venous pressure. Diameter: 20 mm.  ------------------------------------------------------------------- Measurements   IVC                                        Value        Reference  ID                                         20    mm     ----------    Left ventricle                             Value        Reference  LV ID, ED, PLAX chordal            (H)     57.2  mm     43 - 52  LV ID, ES, PLAX  chordal            (H)     45    mm     23 - 38  LV fx shortening, PLAX chordal     (L)     21    %      >=29  LV PW thickness, ED                        14.1  mm     ----------  IVS/LV PW ratio, ED  0.89         <=1.3  Stroke volume, 2D                          100   ml     ----------  Stroke volume/bsa, 2D                      45    ml/m^2 ----------  LV e&', lateral                             13.5  cm/s   ----------  LV E/e&', lateral                           7.63         ----------  LV e&', medial                              8.49  cm/s   ----------  LV E/e&', medial                            12.13        ----------  LV e&', average                             11    cm/s   ----------  LV E/e&', average                           9.37         ----------    Ventricular septum                         Value        Reference  IVS thickness, ED                          12.5  mm     ----------    LVOT                                       Value        Reference  LVOT ID, S                                 24    mm     ----------  LVOT area                                  4.52  cm^2   ----------  LVOT peak velocity, S                      105   cm/s   ----------  LVOT mean velocity, S                      73.4  cm/s   ----------  LVOT VTI, S  22.1  cm     ----------    Aortic valve                               Value        Reference  Aortic regurg pressure half-time           425   ms     ----------    Aorta                                      Value        Reference  Aortic root ID, ED                         43    mm     ----------  Ascending aorta ID, A-P, S                 4.6   mm     ----------    Left atrium                                Value        Reference  LA ID, A-P, ES                             40    mm     ----------  LA ID/bsa, A-P                             1.78  cm/m^2 <=2.2  LA volume, S                                156   ml     ----------  LA volume/bsa, S                           69.5  ml/m^2 ----------  LA volume, ES, 1-p A4C                     145   ml     ----------  LA volume/bsa, ES, 1-p A4C                 64.6  ml/m^2 ----------  LA volume, ES, 1-p A2C                     158   ml     ----------  LA volume/bsa, ES, 1-p A2C                 70.4  ml/m^2 ----------    Mitral valve                               Value        Reference  Mitral E-wave peak velocity                103   cm/s   ----------  Mitral deceleration time  162   ms     150 - 230  Mitral peak gradient, D                    4     mm Hg  ----------  Mitral regurg VTI, PISA                    158   cm     ----------  Mitral ERO, PISA                           0.14  cm^2   ----------  Mitral regurg volume, PISA                 22    ml     ----------    Pulmonary arteries                         Value        Reference  PA pressure, S, DP                         27    mm Hg  <=30    Tricuspid valve                            Value        Reference  Tricuspid regurg peak velocity             246   cm/s   ----------  Tricuspid peak RV-RA gradient              24    mm Hg  ----------  Tricuspid maximal regurg velocity,         246   cm/s   ----------  PISA    Right atrium                               Value        Reference  RA ID, S-I, ES, A4C                (H)     68.1  mm     34 - 49  RA area, ES, A4C                   (H)     20.7  cm^2   8.3 - 19.5  RA volume, ES, A/L                         53.1  ml     ----------  RA volume/bsa, ES, A/L                     23.7  ml/m^2 ----------    Systemic veins                             Value        Reference  Estimated CVP                              3     mm Hg  ----------  Right ventricle                            Value        Reference  TAPSE                                      20.8  mm     ----------  RV pressure, S, DP                         27     mm Hg  <=30  RV s&', lateral, S                          8.7   cm/s   ----------  Legend: (L)  and  (H)  mark values outside specified reference range.  ------------------------------------------------------------------- Prepared and Electronically Authenticated by  Skeet Latch, MD 2019-02-07T15:37:44  PFT's: FEV1 2.08  61% +10% post treatment  DLCO 115 %    Assessment / Plan:     1)  Dilated ascending aorta, chronic 4.7 cm in 2008 now 4.9 cm today  ,                    Aortic Size Index=    4.9     /Body surface area is 2.27 meters squared. = 2.15                                                      < 2.75 cm/m2      4% risk per year                                                       2.75 to 4.25          8% risk per year                                                      > 4.25 cm/m2    20% risk per year  #2 valvular heart disease by echo report: Trileaflet aortic valve  Currently the degree of aortic insufficiency is  mild moderate there also mild left ventricular dilatation (LVEDD = 57, 59 in 2016   #3 chronic atrial fibrillation: now on anticoagulation #4 extensive coronary artery calcification on CT scan, positive family history of early coronary artery disease, no previous history of ischemic events, no current symptoms #5 history of pulmonary emboli x2, no history of workup for hypercoagulable state-on Xarelto #6 obstructive sleep apnea/Moderaltly Severe Airflow limitation #7 hyperlipidemia  #8 benign prostatic hypertrophy #9 history of hypertension, he is currently on a beta blocker  I have reviewed with him the most recent CT scan and recommended that we repeat surveillance of the size of his aorta with a CT of the chest in 8  months with follow-up echocardiogram also.   I reviewed with him the signs and symptoms of aortic dissection,  We discussed proceeding with elective aortic replacement with aorta in the vicinity of 5.5 cm or if  develops worsening  aortic insufficiency and requires aortic valve replacement.  Patient was warned about not using Cipro and similar antibiotics. Recent studies have raised concern that fluoroquinolone antibiotics could be associated with an increased risk of aortic aneurysm Fluoroquinolones have non-antimicrobial properties that might jeopardise the integrity of the extracellular matrix of the vascular wall In a  propensity score matched cohort study in [place], there was a 66% increased rate of aortic aneurysm or dissection associated with oral fluoroquinolone use, compared with amoxicillin use, within a 60 day risk period from start of treatment    Grace Isaac MD      [address] [place] [address] Office [phone]   Beeper [phone]  04/02/2017 5:30 PM

## 2017-04-02 NOTE — Patient Instructions (Signed)

## 2017-04-03 ENCOUNTER — Ambulatory Visit: Payer: PPO | Admitting: Cardiothoracic Surgery

## 2017-04-10 DIAGNOSIS — M1711 Unilateral primary osteoarthritis, right knee: Secondary | ICD-10-CM | POA: Diagnosis not present

## 2017-04-17 ENCOUNTER — Telehealth: Payer: Self-pay

## 2017-04-17 NOTE — Telephone Encounter (Signed)
   Primary Cardiologist:No primary care provider on file.  Chart reviewed as part of pre-operative protocol coverage. Because of Aaron Richmond past medical history and time since last visit, he/she will require a follow-up visit in order to better assess preoperative cardiovascular risk.  Pre-op covering staff: - Please schedule appointment and call patient to inform them. - Please contact requesting surgeon's office via preferred method (i.e, phone, fax) to inform them of need for appointment prior to surgery.  Cecilie Kicks, NP  04/17/2017, 4:54 PM

## 2017-04-17 NOTE — Telephone Encounter (Signed)
   [facility] Pre-operative Risk Assessment    Request for surgical clearance:  1. What type of surgery is being performed? Right Total Knee Arthroplasty   2. When is this surgery scheduled? Has not been scheduled    3. What type of clearance is required (medical clearance vs. Pharmacy clearance to hold med vs. Both)? BOTH  4. Are there any medications that need to be held prior to surgery and how long? Xarelto   5. Practice name and name of physician performing surgery? [facility]   6. What is your office phone number [phone]    7.   What is your office fax number  [phone]  8.   Anesthesia type (None, local, MAC, general) ? Unknown   Aaron Richmond 04/17/2017, 9:16 AM  _________________________________________________________________   (provider comments below)

## 2017-04-18 NOTE — Telephone Encounter (Signed)
Left detailed message for pt re: cardiac clearance and that pt will need to be seen before he could be cleared. Left detailed message for him to call and get that scheduled.

## 2017-04-21 NOTE — Telephone Encounter (Signed)
Left message to call back  

## 2017-04-22 DIAGNOSIS — M1711 Unilateral primary osteoarthritis, right knee: Secondary | ICD-10-CM | POA: Diagnosis not present

## 2017-04-23 ENCOUNTER — Telehealth: Payer: Self-pay | Admitting: Cardiology

## 2017-04-23 NOTE — Telephone Encounter (Signed)
Left detailed message for patient to call and arrange OV for pre op clearance.

## 2017-04-23 NOTE — Telephone Encounter (Signed)
Patient

## 2017-04-24 NOTE — Telephone Encounter (Signed)
LMOVM  ON HOME AND CELL TO CONTACT OFFICE BACK FOR PRE OP CLEARANCE APPT.

## 2017-05-09 DIAGNOSIS — G4733 Obstructive sleep apnea (adult) (pediatric): Secondary | ICD-10-CM | POA: Diagnosis not present

## 2017-05-15 DIAGNOSIS — I10 Essential (primary) hypertension: Secondary | ICD-10-CM | POA: Diagnosis not present

## 2017-05-15 DIAGNOSIS — I719 Aortic aneurysm of unspecified site, without rupture: Secondary | ICD-10-CM | POA: Diagnosis not present

## 2017-05-30 ENCOUNTER — Ambulatory Visit: Payer: PPO | Admitting: Cardiology

## 2017-05-30 ENCOUNTER — Encounter: Payer: Self-pay | Admitting: Cardiology

## 2017-05-30 VITALS — BP 110/80 | HR 99 | Ht 70.0 in | Wt 224.6 lb

## 2017-05-30 DIAGNOSIS — I2510 Atherosclerotic heart disease of native coronary artery without angina pectoris: Secondary | ICD-10-CM

## 2017-05-30 DIAGNOSIS — I10 Essential (primary) hypertension: Secondary | ICD-10-CM

## 2017-05-30 DIAGNOSIS — E782 Mixed hyperlipidemia: Secondary | ICD-10-CM | POA: Diagnosis not present

## 2017-05-30 DIAGNOSIS — I482 Chronic atrial fibrillation, unspecified: Secondary | ICD-10-CM

## 2017-05-30 DIAGNOSIS — I712 Thoracic aortic aneurysm, without rupture: Secondary | ICD-10-CM | POA: Diagnosis not present

## 2017-05-30 DIAGNOSIS — I7121 Aneurysm of the ascending aorta, without rupture: Secondary | ICD-10-CM

## 2017-05-30 NOTE — Patient Instructions (Signed)

## 2017-05-30 NOTE — Progress Notes (Signed)
Patient ID: Aaron Richmond, male   DOB: Mar 15, 1945, 72 y.o.   MRN: [id number]    Patient Name: Aaron Richmond Date of Encounter: 05/30/2017  Primary Care Provider:  [name], MD Primary Cardiologist:  [name]  Patient Profile  Atrial fibrillation, aortic regurgitation, ascending aortic aneurysm.  Problem List   Past Medical History:  Diagnosis Date  . Aortic regurgitation   . 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 Atrial fibrillation ([place])   . BPH (benign prostatic hyperplasia)   . Hyperlipidemia   . Hypertension   . Obesity   . Pulmonary embolism ([place])   . Rhinitis   . Sleep apnea    CPAP   Past Surgical History:  Procedure Laterality Date  . APPENDECTOMY    . HERNIA REPAIR    . TONSILLECTOMY     Allergies  Allergies  Allergen Reactions  . Statins Other (See Comments)    MYALGIAS   HPI  72 year old with h/o hypertension and hyperlipidemia, persistent atrial fibrillation, h/o PE, known OSA on CPAP with known dilated ascending aorta noted on CT in 2008 measured at 4.7 cm and 5 cm in May 2014. The patient was referred to [facility] for preop visit prior to the ear surgery. He was scheduled for a stress echo that was not performed as there was a concern about dilated root and use of Dobutamine. Patient's brother had a myocardial infarction at age 41.   11/16/2015 - this is one year follow-up, patient states that he has no chest pain no dyspnea on exertion, his symptoms haven't changed all in the last year denies any palpitations or syncope. He has been compliant with his medications.   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 He denies any lower extremity edema orthopnea or proximal nocturnal  dyspnea he has been compliant with his medications and has no side effects no bleeding with Xarelto.  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  He denies any chest pain shortness of breath palpitation dizziness or syncope.  No lower edema other than right knee swelling.  He has significant arthritis in his right knee and will have a knee replacement later this year.  He denies any bleeding with Xarelto.  Home Medications  Prior to Admission medications   Medication Sig Start Date End Date Taking? Authorizing Provider  aspirin 81 MG tablet Take 81 mg by mouth daily. 2 tablets daily   Yes Historical Provider, MD  b complex vitamins tablet Take 1 tablet by mouth daily.   Yes Historical Provider, MD  Chelated Zinc 50 MG TABS Take 1 tablet by mouth daily.   Yes Historical Provider, MD  finasteride (PROSCAR) 5 MG tablet Take 5 mg by mouth daily.   Yes Historical Provider, MD  fish oil-omega-3 fatty acids 1000 MG capsule Take 1,200 g by mouth daily.   Yes Historical Provider, MD  folic acid (FOLVITE) 622 MCG tablet Take 800 mcg by mouth daily.   Yes Historical Provider, MD  hydrochlorothiazide (HYDRODIURIL) 25 MG tablet Take 25 mg by mouth daily.   Yes Historical Provider, MD  Inositol Niacinate (NO FLUSH  NIACIN) 100 MG TABS Take 1 tablet by mouth 2 (two) times daily.   Yes Historical Provider, MD  meloxicam (MOBIC) 7.5 MG tablet Take 7.5 mg by mouth daily.   Yes Historical Provider, MD  metoprolol tartrate (LOPRESSOR) 25 MG tablet Take 25 mg by mouth daily.   Yes Historical Provider, MD  Multiple Vitamins-Minerals (MENS MULTIVITAMIN PLUS PO) Take 1 tablet by mouth daily.   Yes Historical Provider, MD  Nettle, Urtica Dioica, (NETTLE LEAF PO) Take 10 mg by mouth daily.   Yes Historical Provider, MD  Potassium 99 MG TABS Take 1 tablet by mouth daily.   Yes Historical Provider, MD  ranitidine (ZANTAC) 75 MG  tablet Take 75 mg by mouth daily.   Yes Historical Provider, MD  Red Yeast Rice Extract (RED YEAST RICE PO) Take 800 mg by mouth 2 (two) times daily.   Yes Historical Provider, MD  tamsulosin (FLOMAX) 0.4 MG CAPS capsule Take 0.4 mg by mouth daily after supper.   Yes Historical Provider, MD  UNABLE TO FIND Take 2 tablets by mouth daily. Med Name:Curamin   Yes Historical Provider, MD    Family History  Family History  Problem Relation Age of Onset  . Stroke Mother   . Alzheimer's disease Mother   . Heart attack Mother   . Stroke Father   . Diabetes Mellitus II Father     Social History  Social History   Socioeconomic History  . Marital status: Married    Spouse name: Not on file  . Number of children: 0  . Years of education: Not on file  . Highest education level: Not on file  Occupational History  . Occupation: Engineering geologist  Social Needs  . Financial resource strain: Not on file  . Food insecurity:    Worry: Not on file    Inability: Not on file  . Transportation needs:    Medical: Not on file    Non-medical: Not on file  Tobacco Use  . Smoking status: Never Smoker  . Smokeless tobacco: Never Used  Substance and Sexual Activity  . Alcohol use: No  . Drug use: No  . Sexual activity: Not on file  Lifestyle  . Physical activity:    Days per week: Not on file    Minutes per session: Not on file  . Stress: Not on file  Relationships  . Social connections:    Talks on phone: Not on file    Gets together: Not on file    Attends religious service: Not on file    Active member of club or organization: Not on file    Attends meetings of clubs or organizations: Not on file    Relationship status: Not on file  . Intimate partner violence:    Fear of current or ex partner: Not on file    Emotionally abused: Not on file    Physically abused: Not on file    Forced sexual activity: Not on file  Other Topics Concern  . Not on file  Social History Narrative  . Not  on file     Review of Systems General:  No chills, fever, night sweats or weight changes.  Cardiovascular:  No chest pain, dyspnea on exertion, edema, orthopnea, palpitations, paroxysmal nocturnal dyspnea. Dermatological: No rash, lesions/masses Respiratory: No cough, dyspnea Urologic: No hematuria, dysuria Abdominal:   No nausea, vomiting, diarrhea, bright red blood per rectum, melena, or hematemesis Neurologic:  No visual changes, wkns, changes in mental status. All  other systems reviewed and are otherwise negative except as noted above.  Physical Exam  Blood pressure 110/80, pulse 99, height 5\' 10"  (1.778 m), weight 224 lb 9.6 oz (101.9 kg), SpO2 97 %.  General: Pleasant, NAD Psych: Normal affect. Neuro: Alert and oriented X 3. Moves all extremities spontaneously. HEENT: Normal  Neck: Supple without bruits or JVD. Lungs:  Resp regular and unlabored, CTA. Heart: RRR no s3, s4, 2/6 diastolic murmur. Abdomen: Soft, non-tender, non-distended, BS + x 4.  Extremities: No clubbing, cyanosis or edema. DP/PT/Radials 2+ and equal bilaterally.  Accessory Clinical Findings  ECG - 09/11/2016, personally reviewed, atrial fibrillation ventricular rate 90 bpm unchanged from prior.  TTE: 11/2015 - Left ventricle: The cavity size was normal. Wall thickness was   increased in a pattern of mild LVH. Systolic function was normal.   The estimated ejection fraction was 55%. Wall motion was normal;   there were no regional wall motion abnormalities. Indeterminant   diastolic function. - Aortic valve: There was no stenosis. There was mild eccentric   regurgitation. - Aorta: Dilated aortic root and ascending aorta. Aortic root   dimension: 42 mm (ED). Ascending aortic diameter: 46 mm (S). - Mitral valve: There was mild regurgitation. - Left atrium: The atrium was moderately dilated. - Right ventricle: The cavity size was normal. Systolic function   was normal. - Right atrium: The atrium was  mildly dilated. - Tricuspid valve: Peak RV-RA gradient (S): 26 mm Hg. - Pulmonary arteries: PA peak pressure: 34 mm Hg (S). - Systemic veins: IVC measured 2.4 cm with > 50% respirophasic   variation, suggesting RA pressure 8 mmHg.  Chest CTA: 04/02/2017  IMPRESSION: 4.9 cm ascending thoracic aortic aneurysm which is not significantly changed compared to prior exam. Recommend semi-annual imaging followup by CTA or MRA and referral to cardiothoracic surgery if not already obtained. This recommendation follows 2010 ACCF/AHA/AATS/ACR/ASA/SCA/SCAI/SIR/STS/SVM Guidelines for the Diagnosis and Management of Patients With Thoracic Aortic Disease. Circulation. 2010; 121: Y706-C376.  Coronary artery calcifications are noted suggesting coronary artery disease.   Assessment & Plan  1) Dilated ascending aorta, chronic 4.7 cm in 2008, stable in April 2015, In March 2018 CTA showed ascending aorta measured 49 mm, this is all stable in March 2019.   He is followed by Dr [name] annually and he doesn't want to operate unless diameter reaches 55 mm or there is any other indication.  Echo in February 2019 shows stable LVEF 50 to 55% and moderate aortic regurgitation.  We will repeat annually.  2. CAD - The patient has extensive coronary artery calcification on CT scan, positive family history of early coronary artery disease. However, the patient is acting completely asymptomatic, he has no signs of angina or heart failure and doesn't require any ischemic workup at this point.  3. Chronic atrial fibrillation, CHA2DS2-VASc score 2. Continue Xarelto, no bleeding.  4. Hyperlipidemia - statin intolerance, but controlled on niacin, red yeast rice and diet  5. Hypertension - controlled on current regimen.  No orthostatic hypotension.  6.  Knee arthritis -once he goes for surgery the will hold his Plavix 2 days prior with no bridging.  Follow up in 6 months. Chest CTA and echocardiogram scheduled for  February 2019.  [name], MD 05/30/2017, 9:35 AM

## 2017-07-25 DIAGNOSIS — M1711 Unilateral primary osteoarthritis, right knee: Secondary | ICD-10-CM | POA: Diagnosis not present

## 2017-08-21 ENCOUNTER — Other Ambulatory Visit: Payer: Self-pay | Admitting: Cardiology

## 2017-09-04 DIAGNOSIS — M25511 Pain in right shoulder: Secondary | ICD-10-CM | POA: Diagnosis not present

## 2017-09-15 ENCOUNTER — Telehealth: Payer: Self-pay

## 2017-09-15 NOTE — Telephone Encounter (Signed)
   [facility] Pre-operative Risk Assessment    Request for surgical clearance:  1. What type of surgery is being performed? RIGHT TOTAL KNEE ARTHROPLASTY   2. When is this surgery scheduled? TBD   3. What type of clearance is required (medical clearance vs. Pharmacy clearance to hold med vs. Both)? BOTH  4. Are there any medications that need to be held prior to surgery and how long? NONE LISTED PT IS ON XARELTO  5. Practice name and name of physician performing surgery? [facility]   6. What is your office phone number [phone]    7.   What is your office fax number [phone]  8.   Anesthesia type (None, local, MAC, general) ? NONE LISTED   Aaron Richmond 09/15/2017, 5:09 PM  _________________________________________________________________   (provider comments below)

## 2017-09-16 NOTE — Telephone Encounter (Signed)
Surgery date is 11/05/17

## 2017-09-16 NOTE — Telephone Encounter (Signed)
Pt has an appointment 10/09/17- clearance to be provided then (surgery date is 10/30).  Kerin Ransom PA-C 09/16/2017 4:31 PM

## 2017-10-08 DIAGNOSIS — Z79899 Other long term (current) drug therapy: Secondary | ICD-10-CM | POA: Diagnosis not present

## 2017-10-08 DIAGNOSIS — I517 Cardiomegaly: Secondary | ICD-10-CM | POA: Diagnosis not present

## 2017-10-08 DIAGNOSIS — M79609 Pain in unspecified limb: Secondary | ICD-10-CM | POA: Diagnosis not present

## 2017-10-08 DIAGNOSIS — Z01818 Encounter for other preprocedural examination: Secondary | ICD-10-CM | POA: Diagnosis not present

## 2017-10-08 DIAGNOSIS — R918 Other nonspecific abnormal finding of lung field: Secondary | ICD-10-CM | POA: Diagnosis not present

## 2017-10-08 DIAGNOSIS — R52 Pain, unspecified: Secondary | ICD-10-CM | POA: Diagnosis not present

## 2017-10-09 NOTE — Telephone Encounter (Signed)
Received follow up clearance form, the surgery is scheduled for 11/05/17

## 2017-10-10 ENCOUNTER — Other Ambulatory Visit: Payer: Self-pay | Admitting: Cardiothoracic Surgery

## 2017-10-10 DIAGNOSIS — I712 Thoracic aortic aneurysm, without rupture, unspecified: Secondary | ICD-10-CM

## 2017-10-13 DIAGNOSIS — Z01818 Encounter for other preprocedural examination: Secondary | ICD-10-CM

## 2017-10-13 DIAGNOSIS — I2510 Atherosclerotic heart disease of native coronary artery without angina pectoris: Secondary | ICD-10-CM | POA: Insufficient documentation

## 2017-10-13 HISTORY — DX: Encounter for other preprocedural examination: Z01.818

## 2017-10-13 HISTORY — DX: Atherosclerotic heart disease of native coronary artery without angina pectoris: I25.10

## 2017-10-13 NOTE — Progress Notes (Addendum)
Cardiology Office Note    Date:  10/14/2017   ID:  Aaron Richmond, DOB 1945-05-28, MRN [id number]  PCP:  [name], MD  Cardiologist: [name], MD EPS: None  Chief Complaint  Patient presents with  . Pre-op Exam    History of Present Illness:  Aaron Richmond is a 72 y.o. male with history of persistent atrial fibrillation CHA2DS2-VASc score equals 3 on Xarelto, dilated asending aortic aneurysm 4.9 cm on CTA 03/2017 followed yearly by Dr. [name] who does not want operate unless diameter reaches 55 mm or there is another indication.  2D echo 02/2017 LVEF 50 to 55% with moderate aortic regurgitation.  Also has extensive coronary calcification on CT scan and positive family history of early CAD per patient has had no angina.  Also has hypertension, HLD. History of Pulmonary embolus after a long plane ride about 2009.  Patient is here today for surgical clearance for right total knee arthroplasty scheduled 11/05/2017 by [facility].Denies chest pain, dyspnea, dyspnea on exertion, dizziness or presyncope. Walks a lot for his job and 1/2 mile/day-limited by his knee. Push Mows his yard.  Past Medical History:  Diagnosis Date  . Aortic regurgitation   . Aortic root aneurysm ([place])   . Atrial fibrillation ([place])   . BPH (benign prostatic hyperplasia)   . Hyperlipidemia   . Hypertension   . Obesity   . Pulmonary embolism ([place])   . Rhinitis   . Sleep apnea    CPAP    Past Surgical History:  Procedure Laterality Date  . APPENDECTOMY    . HERNIA REPAIR    . TONSILLECTOMY      Current Medications: Current Meds  Medication Sig  . b complex vitamins tablet Take 1 tablet by mouth daily.  . Chelated Zinc 50 MG TABS Take 1 tablet by mouth daily.  . finasteride (PROSCAR) 5 MG tablet Take 5 mg by mouth daily.  . fish oil-omega-3 fatty acids 1000 MG capsule Take 1,200 g by mouth daily.  . folic acid (FOLVITE) 756 MCG tablet Take 800 mcg by  mouth daily.  . hydrochlorothiazide (HYDRODIURIL) 25 MG tablet TAKE ONE TABLET BY MOUTH DAILY  . Inositol Niacinate (NO FLUSH NIACIN) 100 MG TABS Take 1 tablet by mouth 2 (two) times daily.  . meloxicam (MOBIC) 7.5 MG tablet Take 7.5 mg by mouth daily.  . metoprolol tartrate (LOPRESSOR) 25 MG tablet TAKE 1 TABLET BY MOUTH TWICE DAILY  . Multiple Vitamins-Minerals (MENS MULTIVITAMIN PLUS PO) Take 1 tablet by mouth daily.  . Potassium 99 MG TABS Take 1 tablet by mouth daily.  . ranitidine (ZANTAC) 75 MG tablet Take 75 mg by mouth daily.  . Red Yeast Rice Extract (RED YEAST RICE PO) Take 800 mg by mouth 2 (two) times daily.  . tamsulosin (FLOMAX) 0.4 MG CAPS capsule Take 0.4 mg by mouth daily after supper.  Marland Kitchen UNABLE TO FIND Take 8 tablets by mouth daily. Med Name:Curamin  . XARELTO 20 MG TABS tablet TAKE 1 TABLET BY MOUTH ONCE DAILY WITH SUPPER     Allergies:   Statins   Social History   Socioeconomic History  . Marital status: Married    Spouse name: Not on file  . Number of children: 0  . Years of education: Not on file  . Highest education level: Not on file  Occupational History  . Occupation: Engineering geologist  Social Needs  . Financial resource strain: Not on file  .  Food insecurity:    Worry: Not on file    Inability: Not on file  . Transportation needs:    Medical: Not on file    Non-medical: Not on file  Tobacco Use  . Smoking status: Never Smoker  . Smokeless tobacco: Never Used  Substance and Sexual Activity  . Alcohol use: No  . Drug use: No  . Sexual activity: Not on file  Lifestyle  . Physical activity:    Days per week: Not on file    Minutes per session: Not on file  . Stress: Not on file  Relationships  . Social connections:    Talks on phone: Not on file    Gets together: Not on file    Attends religious service: Not on file    Active member of club or organization: Not on file    Attends meetings of clubs or organizations: Not on file     Relationship status: Not on file  Other Topics Concern  . Not on file  Social History Narrative  . Not on file     Family History:  The patient's family history includes Alzheimer's disease in his mother; Diabetes Mellitus II in his father; Heart attack in his mother; Stroke in his father and mother.   ROS:   Please see the history of present illness.    Review of Systems  Constitution: Negative.  HENT: Negative.   Cardiovascular: Negative.   Respiratory: Negative.   Endocrine: Negative.   Hematologic/Lymphatic: Negative.   Musculoskeletal: Positive for arthritis and joint pain.  Gastrointestinal: Negative.   Genitourinary: Negative.   Neurological: Negative.    All other systems reviewed and are negative.   PHYSICAL EXAM:   VS:  BP 128/86   Pulse 98   Ht 5\' 10"  (1.778 m)   Wt 220 lb 1.9 oz (99.8 kg)   SpO2 94%   BMI 31.58 kg/m   Physical Exam  GEN: Well nourished, well developed, in no acute distress  Neck: no JVD, carotid bruits, or masses Cardiac:irreg irreg; no murmurs, rubs, or gallops  Respiratory:  clear to auscultation bilaterally, normal work of breathing GI: soft, nontender, nondistended, + BS Ext: without cyanosis, clubbing, or edema, Good distal pulses bilaterally Neuro:  Alert and Oriented x 3 Psych: euthymic mood, full affect  Wt Readings from Last 3 Encounters:  10/14/17 220 lb 1.9 oz (99.8 kg)  05/30/17 224 lb 9.6 oz (101.9 kg)  04/02/17 224 lb (101.6 kg)      Studies/Labs Reviewed:   EKG:  EKG is not ordered today.  The ekg reviewed from 05/2017 Atrial fib  Recent Labs: 02/13/2017: BUN 24; Creatinine, Ser 1.13; Potassium 4.3; Sodium 135   Lipid Panel No results found for: CHOL, TRIG, HDL, CHOLHDL, VLDL, LDLCALC, LDLDIRECT  Additional studies/ records that were reviewed today include:  2D echo 2/2019Study Conclusions   - Left ventricle: The cavity size was mildly dilated. Wall   thickness was increased in a pattern of moderate LVH.  Systolic   function was normal. The estimated ejection fraction was in the   range of 50% to 55%. Wall motion was normal; there were no   regional wall motion abnormalities. Doppler parameters are   consistent with indeterminate ventricular filling pressure. - Aortic valve: Transvalvular velocity was within the normal range.   There was no stenosis. There was moderate regurgitation.   Regurgitation pressure half-time: 425 ms. - Ascending aorta: The ascending aorta was moderately dilated. - Mitral valve: There was mild  regurgitation. - Left atrium: The atrium was severely dilated. - Right ventricle: The cavity size was normal. Wall thickness was   normal. Systolic function was normal. - Atrial septum: No defect or patent foramen ovale was identified. - Tricuspid valve: There was mild regurgitation. - Pulmonary arteries: Systolic pressure was within the normal   range. PA peak pressure: 27 mm Hg (S).   CT Angie of the chestFINDINGS: Cardiovascular: 4.9 cm ascending thoracic aortic aneurysm is noted without dissection. Great vessels are widely patent without significant stenosis. Tortuosity of descending thoracic aorta is noted. Transverse aortic arch measures 2.9 cm. Proximal descending thoracic aorta measures 3.3 cm. Coronary artery calcifications are noted. No pericardial effusion is noted. IMPRESSION: 4.9 cm ascending thoracic aortic aneurysm which is not significantly changed compared to prior exam. Recommend semi-annual imaging followup by CTA or MRA and referral to cardiothoracic surgery if not already obtained. This recommendation follows 2010 ACCF/AHA/AATS/ACR/ASA/SCA/SCAI/SIR/STS/SVM Guidelines for the Diagnosis and Management of Patients With Thoracic Aortic Disease. Circulation. 2010; 121: X833-A250.   Coronary artery calcifications are noted suggesting coronary artery disease.     Electronically Signed   By: Marijo Conception, M.D.   On: 04/02/2017 15:57        ASSESSMENT:    1. Preoperative clearance   2. Ascending aortic aneurysm (HCC)   3. Paroxysmal atrial fibrillation ([place])   4. Aortic valve insufficiency, etiology of cardiac valve disease unspecified   5. Essential hypertension   6. History of pulmonary embolism   7. Coronary artery calcification seen on CT scan   8. Mixed hyperlipidemia      PLAN:  In order of problems listed above:  Preoperative clearance before undergoing knee arthroscopy. Low risk surgery but history of Coronary Calcification on CT, atrial fibrillation, history of PE.  Discussed with Dr. Tamala Julian who recommends [facility].  The patient is asymptomatic and if this scan is high risk he will need further testing.  If it is low or moderate risk because he is asymptomatic he says he could proceed with surgery.  According to the Revised Cardiac Risk Index (RCRI), his Perioperative Risk of Major Cardiac Event is (%): 0.9  His Functional Capacity in METs is: 7.25 according to the [facility] Activity Status Index (DASI).  Addendum 10/28/2017 Nuclear stress test 10/27/2017 showed no ischemia, low risk study LVEF 49% mildly decreased.  2D echo 02/13/2017 LVEF 50 to 55%.  May proceed with right total knee arthroplasty without any further cardiac work-up.  He may hold his Xarelto 3 days prior to surgery per pharmacy.    Ascending aortic aneurysm 49 mm on CTA 03/2017 followed by Dr. [name]  PAF chads vas score equals 3 on Xarelto.  Pharmacy reviewed patient's chart and recommends that he can hold Xarelto 3 days prior to total knee arthroplasty.  Aortic valve insufficiency moderate on 2D echo 02/2017  Essential hypertension metoprolol on HydroDIURIL  History of pulmonary embolism after a long flight in 2009.  Coronary artery calcification seen on CT scan and strong family history of early CAD but is asymptomatic without chest pain Hyperlipidemia with statin allergy.  LDL 115 09/2016.  Will refer to lipid clinic for  PCSK9 inhibitor.   Medication Adjustments/Labs and Tests Ordered: Current medicines are reviewed at length with the patient today.  Concerns regarding medicines are outlined above.  Medication changes, Labs and Tests ordered today are listed in the Patient Instructions below. Patient Instructions  Medication Instructions:  Your physician recommends that you continue on your current medications  as directed. Please refer to the Current Medication list given to you today.  If you need a refill on your cardiac medications before your next appointment, please call your pharmacy.   Lab work: None ordered If you have labs (blood work) drawn today and your tests are completely normal, you will receive your results only by: Marland Kitchen MyChart Message (if you have MyChart) OR . A paper copy in the mail If you have any lab test that is abnormal or we need to change your treatment, we will call you to review the results.  Testing/Procedures: Your physician has requested that you have a lexiscan myoview (next available). For further information please visit [email or web address]. Please follow instruction sheet, as given.    Follow-Up: At [facility], you and your health needs are our priority.  As part of our continuing mission to provide you with exceptional heart care, we have created designated Provider Care Teams.  These Care Teams include your primary Cardiologist (physician) and Advanced Practice Providers (APPs -  Physician Assistants and Nurse Practitioners) who all work together to provide you with the care you need, when you need it. You will need a follow up appointment in 6 months.  Please call our office 2 months in advance to schedule this appointment.  You may see [name], MD or one of the following Advanced Practice Providers on your designated Care Team:   [place], PA-C Melina Copa, PA-C . Ermalinda Barrios, PA-C  Any Other Special Instructions Will Be Listed Below (If  Applicable). Cardiac Nuclear Scan A cardiac nuclear scan is a test that measures blood flow to the heart when a person is resting and when he or she is exercising. The test looks for problems such as:  Not enough blood reaching a portion of the heart.  The heart muscle not working normally.  You may need this test if:  You have heart disease.  You have had abnormal lab results.  You have had heart surgery or angioplasty.  You have chest pain.  You have shortness of breath.  In this test, a radioactive dye (tracer) is injected into your bloodstream. After the tracer has traveled to your heart, an imaging device is used to measure how much of the tracer is absorbed by or distributed to various areas of your heart. This procedure is usually done at a hospital and takes 2-4 hours. Tell a health care provider about:  Any allergies you have.  All medicines you are taking, including vitamins, herbs, eye drops, creams, and over-the-counter medicines.  Any problems you or family members have had with the use of anesthetic medicines.  Any blood disorders you have.  Any surgeries you have had.  Any medical conditions you have.  Whether you are pregnant or may be pregnant. What are the risks? Generally, this is a safe procedure. However, problems may occur, including:  Serious chest pain and heart attack. This is only a risk if the stress portion of the test is done.  Rapid heartbeat.  Sensation of warmth in your chest. This usually passes quickly.  What happens before the procedure?  Ask your health care provider about changing or stopping your regular medicines. This is especially important if you are taking diabetes medicines or blood thinners.  Remove your jewelry on the day of the procedure. What happens during the procedure?  An IV tube will be inserted into one of your veins.  Your health care provider will inject a small amount of radioactive tracer  through the  tube.  You will wait for 20-40 minutes while the tracer travels through your bloodstream.  Your heart activity will be monitored with an electrocardiogram (ECG).  You will lie down on an exam table.  Images of your heart will be taken for about 15-20 minutes.  You may be asked to exercise on a treadmill or stationary bike. While you exercise, your heart's activity will be monitored with an ECG, and your blood pressure will be checked. If you are unable to exercise, you may be given a medicine to increase blood flow to parts of your heart.  When blood flow to your heart has peaked, a tracer will again be injected through the IV tube.  After 20-40 minutes, you will get back on the exam table and have more images taken of your heart.  When the procedure is over, your IV tube will be removed. The procedure may vary among health care providers and hospitals. Depending on the type of tracer used, scans may need to be repeated 3-4 hours later. What happens after the procedure?  Unless your health care provider tells you otherwise, you may return to your normal schedule, including diet, activities, and medicines.  Unless your health care provider tells you otherwise, you may increase your fluid intake. This will help flush the contrast dye from your body. Drink enough fluid to keep your urine clear or pale yellow.  It is up to you to get your test results. Ask your health care provider, or the department that is doing the test, when your results will be ready. Summary  A cardiac nuclear scan measures the blood flow to the heart when a person is resting and when he or she is exercising.  You may need this test if you are at risk for heart disease.  Tell your health care provider if you are pregnant.  Unless your health care provider tells you otherwise, increase your fluid intake. This will help flush the contrast dye from your body. Drink enough fluid to keep your urine clear or pale  yellow. This information is not intended to replace advice given to you by your health care provider. Make sure you discuss any questions you have with your health care provider. Document Released: 01/19/2004 Document Revised: 12/27/2015 Document Reviewed: 12/02/2012 Elsevier Interactive Patient Education  2017 [place], Ermalinda Barrios, [place]  10/14/2017 10:33 AM    [facility] [address], [place], [place]  [address] Phone: [phone]; Fax: [phone]

## 2017-10-14 ENCOUNTER — Ambulatory Visit: Payer: PPO | Admitting: Physician Assistant

## 2017-10-14 ENCOUNTER — Encounter: Payer: Self-pay | Admitting: Physician Assistant

## 2017-10-14 VITALS — BP 128/86 | HR 98 | Ht 70.0 in | Wt 220.1 lb

## 2017-10-14 DIAGNOSIS — I2510 Atherosclerotic heart disease of native coronary artery without angina pectoris: Secondary | ICD-10-CM

## 2017-10-14 DIAGNOSIS — Z01818 Encounter for other preprocedural examination: Secondary | ICD-10-CM | POA: Diagnosis not present

## 2017-10-14 DIAGNOSIS — E785 Hyperlipidemia, unspecified: Secondary | ICD-10-CM | POA: Insufficient documentation

## 2017-10-14 DIAGNOSIS — E782 Mixed hyperlipidemia: Secondary | ICD-10-CM | POA: Diagnosis not present

## 2017-10-14 DIAGNOSIS — I10 Essential (primary) hypertension: Secondary | ICD-10-CM | POA: Diagnosis not present

## 2017-10-14 DIAGNOSIS — I480 Paroxysmal atrial fibrillation: Secondary | ICD-10-CM | POA: Diagnosis not present

## 2017-10-14 DIAGNOSIS — I712 Thoracic aortic aneurysm, without rupture: Secondary | ICD-10-CM

## 2017-10-14 DIAGNOSIS — I351 Nonrheumatic aortic (valve) insufficiency: Secondary | ICD-10-CM | POA: Diagnosis not present

## 2017-10-14 DIAGNOSIS — Z86711 Personal history of pulmonary embolism: Secondary | ICD-10-CM

## 2017-10-14 DIAGNOSIS — I7121 Aneurysm of the ascending aorta, without rupture: Secondary | ICD-10-CM

## 2017-10-14 NOTE — Patient Instructions (Addendum)
Medication Instructions:  Your physician recommends that you continue on your current medications as directed. Please refer to the Current Medication list given to you today.  If you need a refill on your cardiac medications before your next appointment, please call your pharmacy.   Lab work: Your physician recommends that you return for a FASTING lipid profile and liver function panel on same day as lexiscan  If you have labs (blood work) drawn today and your tests are completely normal, you will receive your results only by: Marland Kitchen MyChart Message (if you have MyChart) OR . A paper copy in the mail If you have any lab test that is abnormal or we need to change your treatment, we will call you to review the results.  Testing/Procedures: Your physician has requested that you have a lexiscan myoview (next available). For further information please visit [email or web address]. Please follow instruction sheet, as given.    Follow-Up: You have been referred to Lipid Clinic to discuss PCSK9 inhibitors   At [facility], you and your health needs are our priority.  As part of our continuing mission to provide you with exceptional heart care, we have created designated Provider Care Teams.  These Care Teams include your primary Cardiologist (physician) and Advanced Practice Providers (APPs -  Physician Assistants and Nurse Practitioners) who all work together to provide you with the care you need, when you need it. You will need a follow up appointment in 6 months.  Please call our office 2 months in advance to schedule this appointment.  You may see [name], MD or one of the following Advanced Practice Providers on your designated Care Team:   [place], PA-C Melina Copa, PA-C . Ermalinda Barrios, PA-C  Any Other Special Instructions Will Be Listed Below (If Applicable). Cardiac Nuclear Scan A cardiac nuclear scan is a test that measures blood flow to the heart when a person is resting  and when he or she is exercising. The test looks for problems such as:  Not enough blood reaching a portion of the heart.  The heart muscle not working normally.  You may need this test if:  You have heart disease.  You have had abnormal lab results.  You have had heart surgery or angioplasty.  You have chest pain.  You have shortness of breath.  In this test, a radioactive dye (tracer) is injected into your bloodstream. After the tracer has traveled to your heart, an imaging device is used to measure how much of the tracer is absorbed by or distributed to various areas of your heart. This procedure is usually done at a hospital and takes 2-4 hours. Tell a health care provider about:  Any allergies you have.  All medicines you are taking, including vitamins, herbs, eye drops, creams, and over-the-counter medicines.  Any problems you or family members have had with the use of anesthetic medicines.  Any blood disorders you have.  Any surgeries you have had.  Any medical conditions you have.  Whether you are pregnant or may be pregnant. What are the risks? Generally, this is a safe procedure. However, problems may occur, including:  Serious chest pain and heart attack. This is only a risk if the stress portion of the test is done.  Rapid heartbeat.  Sensation of warmth in your chest. This usually passes quickly.  What happens before the procedure?  Ask your health care provider about changing or stopping your regular medicines. This is especially important if you are taking  diabetes medicines or blood thinners.  Remove your jewelry on the day of the procedure. What happens during the procedure?  An IV tube will be inserted into one of your veins.  Your health care provider will inject a small amount of radioactive tracer through the tube.  You will wait for 20-40 minutes while the tracer travels through your bloodstream.  Your heart activity will be monitored with  an electrocardiogram (ECG).  You will lie down on an exam table.  Images of your heart will be taken for about 15-20 minutes.  You may be asked to exercise on a treadmill or stationary bike. While you exercise, your heart's activity will be monitored with an ECG, and your blood pressure will be checked. If you are unable to exercise, you may be given a medicine to increase blood flow to parts of your heart.  When blood flow to your heart has peaked, a tracer will again be injected through the IV tube.  After 20-40 minutes, you will get back on the exam table and have more images taken of your heart.  When the procedure is over, your IV tube will be removed. The procedure may vary among health care providers and hospitals. Depending on the type of tracer used, scans may need to be repeated 3-4 hours later. What happens after the procedure?  Unless your health care provider tells you otherwise, you may return to your normal schedule, including diet, activities, and medicines.  Unless your health care provider tells you otherwise, you may increase your fluid intake. This will help flush the contrast dye from your body. Drink enough fluid to keep your urine clear or pale yellow.  It is up to you to get your test results. Ask your health care provider, or the department that is doing the test, when your results will be ready. Summary  A cardiac nuclear scan measures the blood flow to the heart when a person is resting and when he or she is exercising.  You may need this test if you are at risk for heart disease.  Tell your health care provider if you are pregnant.  Unless your health care provider tells you otherwise, increase your fluid intake. This will help flush the contrast dye from your body. Drink enough fluid to keep your urine clear or pale yellow. This information is not intended to replace advice given to you by your health care provider. Make sure you discuss any questions you  have with your health care provider. Document Released: 01/19/2004 Document Revised: 12/27/2015 Document Reviewed: 12/02/2012 Elsevier Interactive Patient Education  2017 [employer].

## 2017-10-14 NOTE — Telephone Encounter (Signed)
Pt takes Xarelto for afib with CHADS2VASc sore of 3 (age, HTN, CAD), also with history of recurrent PE twice after plane rides. He was on anticoagulation for each PE but stopped therapy after undetermined time and was not on lifelong anticoagulation for VTE events. CrCl is 47mL/min. Ok to hold Xarelto for 3 days prior to TKA per protocol.

## 2017-10-14 NOTE — Telephone Encounter (Signed)
Called and made patient aware that the PharmD has okayed him to hold his Xarelto 3 days prior to his total knee arthroplasty. Made patient aware that this is all pending cardiac clearance which will be decided after he has his lexiscan done on 10/21. Patient states that he wants all of his information sent to Dr. Meda Coffee for review and see if he needs to have the [place] done in order to be cleared.   Patient's surgeon's name is Dr. Jaymes Graff.

## 2017-10-14 NOTE — Telephone Encounter (Signed)
Pharmacy reviewed patient's chart and says he can hold his Xarelto 3 days prior to total knee arthroplasty

## 2017-10-16 DIAGNOSIS — E669 Obesity, unspecified: Secondary | ICD-10-CM | POA: Insufficient documentation

## 2017-10-16 DIAGNOSIS — G4733 Obstructive sleep apnea (adult) (pediatric): Secondary | ICD-10-CM | POA: Diagnosis not present

## 2017-10-16 HISTORY — DX: Obesity, unspecified: E66.9

## 2017-10-20 DIAGNOSIS — Z1389 Encounter for screening for other disorder: Secondary | ICD-10-CM | POA: Diagnosis not present

## 2017-10-20 DIAGNOSIS — E7800 Pure hypercholesterolemia, unspecified: Secondary | ICD-10-CM | POA: Diagnosis not present

## 2017-10-20 DIAGNOSIS — I10 Essential (primary) hypertension: Secondary | ICD-10-CM | POA: Diagnosis not present

## 2017-10-20 DIAGNOSIS — I4891 Unspecified atrial fibrillation: Secondary | ICD-10-CM | POA: Diagnosis not present

## 2017-10-20 DIAGNOSIS — Z0000 Encounter for general adult medical examination without abnormal findings: Secondary | ICD-10-CM | POA: Diagnosis not present

## 2017-10-20 DIAGNOSIS — N400 Enlarged prostate without lower urinary tract symptoms: Secondary | ICD-10-CM | POA: Diagnosis not present

## 2017-10-20 DIAGNOSIS — I2510 Atherosclerotic heart disease of native coronary artery without angina pectoris: Secondary | ICD-10-CM | POA: Diagnosis not present

## 2017-10-23 ENCOUNTER — Telehealth (HOSPITAL_COMMUNITY): Payer: Self-pay | Admitting: *Deleted

## 2017-10-23 ENCOUNTER — Telehealth: Payer: Self-pay | Admitting: Cardiology

## 2017-10-23 ENCOUNTER — Other Ambulatory Visit: Payer: Self-pay | Admitting: Cardiology

## 2017-10-23 NOTE — Telephone Encounter (Signed)
Dr. Meda Coffee, please read Estella Husk PA-C last OV note on this pt.  He does not agree with her plan that he needs a Lexi prior to getting cleared for knee surgery.  He wants you to review this note and state you agree with her outlined plan for cardiac clearance.  He is scheduled for a Lexi on 10/21.  Please advise.

## 2017-10-23 NOTE — Telephone Encounter (Signed)
Informed the pt that per Dr Meda Coffee, she agreed with Estella Husk PA-C and Dr Tamala Julian DOD, that he will need his Lexi prior to being cleared for needed knee surgery. Pt verbalized understanding and agrees with this plan.  Pt will come in for his scheduled Lexi for 10/27/17.

## 2017-10-23 NOTE — Telephone Encounter (Signed)
I agree with Dr Tamala Julian and Estella Husk about the need for stress test

## 2017-10-23 NOTE — Telephone Encounter (Signed)
Patient given detailed instructions per Myocardial Perfusion Study Information Sheet for the test on 10/27/17. Patient notified to arrive 15 minutes early and that it is imperative to arrive on time for appointment to keep from having the test rescheduled.  If you need to cancel or reschedule your appointment, please call the office within 24 hours of your appointment. . Patient verbalized understanding. Kirstie Peri

## 2017-10-23 NOTE — Telephone Encounter (Signed)
New Message:     Please call Mindy, she said she needs to talk to you about the pt, Aaron Richmond. She gave no further details.

## 2017-10-27 ENCOUNTER — Ambulatory Visit (HOSPITAL_COMMUNITY): Payer: PPO | Attending: Cardiovascular Disease

## 2017-10-27 ENCOUNTER — Other Ambulatory Visit: Payer: PPO | Admitting: *Deleted

## 2017-10-27 DIAGNOSIS — I2510 Atherosclerotic heart disease of native coronary artery without angina pectoris: Secondary | ICD-10-CM

## 2017-10-27 DIAGNOSIS — E782 Mixed hyperlipidemia: Secondary | ICD-10-CM

## 2017-10-27 LAB — MYOCARDIAL PERFUSION IMAGING
CHL CUP NUCLEAR SSS: 4
CSEPPHR: 111 {beats}/min
LV sys vol: 100 mL
LVDIAVOL: 196 mL (ref 62–150)
NUC STRESS TID: 0.99
Rest HR: 87 {beats}/min
SDS: 2
SRS: 2

## 2017-10-27 MED ORDER — TECHNETIUM TC 99M TETROFOSMIN IV KIT
10.5000 | PACK | Freq: Once | INTRAVENOUS | Status: AC | PRN
  Administered 2017-10-27: 10.5 via INTRAVENOUS
  Filled 2017-10-27: qty 11

## 2017-10-27 MED ORDER — TECHNETIUM TC 99M TETROFOSMIN IV KIT
31.4000 | PACK | Freq: Once | INTRAVENOUS | Status: AC | PRN
  Administered 2017-10-27: 31.4 via INTRAVENOUS
  Filled 2017-10-27: qty 32

## 2017-10-27 MED ORDER — REGADENOSON 0.4 MG/5ML IV SOLN
0.4000 mg | Freq: Once | INTRAVENOUS | Status: AC
  Administered 2017-10-27: 0.4 mg via INTRAVENOUS

## 2017-10-27 NOTE — Progress Notes (Signed)
Patient ID: Aaron Richmond                 DOB: Jan 10, 1945                    MRN: [id number]     HPI: Aaron Richmond is a 72 y.o. male patient of Dr. Meda Coffee that presents today for lipid evaluation.  PMH includes persistent atrial fibrillation CHA2DS2-VASc score equals 3 on Xarelto, dilated asending aortic aneurysm 4.9 cm on CTA 03/2017, extensive coronary calcification on CT scan and positive family history of early CAD. He has had difficulty tolerating statin therapy in the past.   He presents today for discussion of cholesterol. He states that he took at statin medication (Dr. Mitzi Hansen prescribed before EMR and he does not recall name). He states he took one pill and felt terrible (flu like symptoms). He believes that he only tried one statin medication.   He also reports that this is the first time he has ever heard that his cholesterol is high. We discussed that there are different targets for cholesterol based on cardiovascular disease and that our goals have decreased based on current knowledge of cholesterol.   Risk Factors: CAD LDL Goal: <70  Current Medications: Fish oil 1200mg  daily, red yeast rice 800mg  BID, no flush niacin Intolerances: unknown statin  Diet: He eats most meals out (60%). He endorses home cooking (country cooking). He eats meats (chicken fish and red meat) prepared fried and grilled. He does eat vegetables regularly. He drinks mostly water, cranberry juice and milk. He drinks iced sweet tea.   Exercise: He walks every afternoon, but is limited due to knee pain.   Family History: Alzheimer's disease in his mother; Diabetes Mellitus II in his father; Heart attack in his mother; Stroke in his father and mother  Social History: denies tobacco and alcohol  Labs: 10/27/17: TC 150, TG 159, HDL 34, LDL 84 (Fish oil, red yeast rice, no flush niacin)  Past Medical History:  Diagnosis Date  . Aortic regurgitation   . Aortic root aneurysm ([place])   . Atrial  fibrillation ([place])   . BPH (benign prostatic hyperplasia)   . Hyperlipidemia   . Hypertension   . Obesity   . Pulmonary embolism ([place])   . Rhinitis   . Sleep apnea    CPAP    Current Outpatient Medications on File Prior to Visit  Medication Sig Dispense Refill  . b complex vitamins tablet Take 1 tablet by mouth daily.    . Chelated Zinc 50 MG TABS Take 1 tablet by mouth daily.    . finasteride (PROSCAR) 5 MG tablet Take 5 mg by mouth daily.    . fish oil-omega-3 fatty acids 1000 MG capsule Take 1,200 g by mouth daily.    . folic acid (FOLVITE) 725 MCG tablet Take 800 mcg by mouth daily.    . hydrochlorothiazide (HYDRODIURIL) 25 MG tablet TAKE 1 TABLET BY MOUTH DAILY 90 tablet 3  . Inositol Niacinate (NO FLUSH NIACIN) 100 MG TABS Take 1 tablet by mouth 2 (two) times daily.    . meloxicam (MOBIC) 7.5 MG tablet Take 7.5 mg by mouth daily.    . metoprolol tartrate (LOPRESSOR) 25 MG tablet TAKE 1 TABLET BY MOUTH TWICE DAILY 180 tablet 2  . Multiple Vitamins-Minerals (MENS MULTIVITAMIN PLUS PO) Take 1 tablet by mouth daily.    . Potassium 99 MG TABS Take 1 tablet by mouth daily.    Marland Kitchen  ranitidine (ZANTAC) 75 MG tablet Take 75 mg by mouth daily.    . Red Yeast Rice Extract (RED YEAST RICE PO) Take 800 mg by mouth 2 (two) times daily.    . tamsulosin (FLOMAX) 0.4 MG CAPS capsule Take 0.4 mg by mouth daily after supper.    Marland Kitchen UNABLE TO FIND Take 8 tablets by mouth daily. Med Name:Curamin    . XARELTO 20 MG TABS tablet TAKE 1 TABLET BY MOUTH ONCE DAILY WITH SUPPER 30 tablet 11   No current facility-administered medications on file prior to visit.     Allergies  Allergen Reactions  . Statins Other (See Comments)    MYALGIAS    Assessment/Plan: Hyperlipidemia: LDL not at goal <70. He is willing to rechallenge with statin medication. Will try low dose rosuvastatin 5mg  three times a week since he developed symptoms after his first dose the last time. We will pan to follow up with him in 1  month to assess tolerability. If tolerating will schedule lipid panel. Also had lengthy discussion about dietary modifications to help with cholesterol as it seems he does have significant dietary indiscretions. He will try to work on this.    Thank you,  Lelan Pons. Patterson Hammersmith, [facility]  10/27/2017 9:21 AM

## 2017-10-28 ENCOUNTER — Encounter: Payer: Self-pay | Admitting: Pharmacist

## 2017-10-28 ENCOUNTER — Telehealth: Payer: Self-pay

## 2017-10-28 ENCOUNTER — Ambulatory Visit (INDEPENDENT_AMBULATORY_CARE_PROVIDER_SITE_OTHER): Payer: PPO | Admitting: Pharmacist

## 2017-10-28 DIAGNOSIS — E782 Mixed hyperlipidemia: Secondary | ICD-10-CM

## 2017-10-28 LAB — HEPATIC FUNCTION PANEL
ALK PHOS: 60 IU/L (ref 39–117)
ALT: 14 IU/L (ref 0–44)
AST: 20 IU/L (ref 0–40)
Albumin: 4 g/dL (ref 3.5–4.8)
Bilirubin Total: 0.5 mg/dL (ref 0.0–1.2)
Bilirubin, Direct: 0.18 mg/dL (ref 0.00–0.40)
TOTAL PROTEIN: 6.3 g/dL (ref 6.0–8.5)

## 2017-10-28 LAB — LIPID PANEL
CHOL/HDL RATIO: 4.4 ratio (ref 0.0–5.0)
Cholesterol, Total: 150 mg/dL (ref 100–199)
HDL: 34 mg/dL — AB (ref >39)
LDL Calculated: 84 mg/dL (ref 0–99)
Triglycerides: 159 mg/dL — ABNORMAL HIGH (ref 0–149)
VLDL Cholesterol Cal: 32 mg/dL (ref 5–40)

## 2017-10-28 MED ORDER — ROSUVASTATIN CALCIUM 5 MG PO TABS: ORAL_TABLET | ORAL | 3 refills | Status: DC

## 2017-10-28 NOTE — Telephone Encounter (Signed)
Patient has been made aware of results. Patient verbalized understanding and thanked me for the call.

## 2017-10-28 NOTE — Patient Instructions (Addendum)
STOP red yeast rice and niacin  START rosuvastatin (Crestor) 5mg  THREE times a week  Please call the clinic at [phone] if you have any questions or issues tolerating the statin medication.    Cholesterol Cholesterol is a fat. Your body needs a small amount of cholesterol. Cholesterol (plaque) may build up in your blood vessels (arteries). That makes you more likely to have a heart attack or stroke. You cannot feel your cholesterol level. Having a blood test is the only way to find out if your level is high. Keep your test results. Work with your doctor to keep your cholesterol at a good level. What do the results mean?  Total cholesterol is how much cholesterol is in your blood.  LDL is bad cholesterol. This is the type that can build up. Try to have low LDL.  HDL is good cholesterol. It cleans your blood vessels and carries LDL away. Try to have high HDL.  Triglycerides are fat that the body can store or burn for energy. What are good levels of cholesterol?  Total cholesterol below 200.  LDL below 100 is good for people who have health risks. LDL below 70 is good for people who have very high risks.  HDL above 40 is good. It is best to have HDL of 60 or higher.  Triglycerides below 150. How can I lower my cholesterol? Diet Follow your diet program as told by your doctor.  Choose fish, white meat chicken, or [place] that is roasted or baked. Try not to eat red meat, fried foods, sausage, or lunch meats.  Eat lots of fresh fruits and vegetables.  Choose whole grains, beans, pasta, potatoes, and cereals.  Choose olive oil, corn oil, or canola oil. Only use small amounts.  Try not to eat butter, mayonnaise, shortening, or palm kernel oils.  Try not to eat foods with trans fats.  Choose low-fat or nonfat dairy foods. ? Drink skim or nonfat milk. ? Eat low-fat or nonfat yogurt and cheeses. ? Try not to drink whole milk or cream. ? Try not to eat ice cream, egg yolks, or  full-fat cheeses.  Healthy desserts include angel food cake, ginger snaps, animal crackers, hard candy, popsicles, and low-fat or nonfat frozen yogurt. Try not to eat pastries, cakes, pies, and cookies.  Exercise Follow your exercise program as told by your doctor.  Be more active. Try gardening, walking, and taking the stairs.  Ask your doctor about ways that you can be more active.  Medicine  Take over-the-counter and prescription medicines only as told by your doctor. This information is not intended to replace advice given to you by your health care provider. Make sure you discuss any questions you have with your health care provider. Document Released: 03/22/2008 Document Revised: 07/26/2015 Document Reviewed: 07/06/2015 Elsevier Interactive Patient Education  [employer].

## 2017-10-28 NOTE — Telephone Encounter (Signed)
-----   Message from Imogene Burn, PA-C sent at 10/28/2017  7:59 AM EDT ----- Low risk nuclear stress test. Heart function mildly decreased but was ok on echo 02/2017. Can proceed with knee surgery. Please find out surgeons name at [facility] and send them a clearance.

## 2017-10-28 NOTE — Telephone Encounter (Signed)
-----   Message from Imogene Burn, PA-C sent at 10/28/2017  8:02 AM EDT ----- LDL above goal at 84 and triglycerides slightly elevated. Allergic to statins. Could try to reduce sugars in diet.

## 2017-10-28 NOTE — Telephone Encounter (Signed)
Clearance has been faxed to Dr. Jaymes Graff. Confirmation received that fax was successful.  Still need to let patient know results.

## 2017-10-28 NOTE — Telephone Encounter (Signed)
Please call patient with results.

## 2017-10-28 NOTE — Telephone Encounter (Signed)
Left message for patient to call back  

## 2017-10-31 DIAGNOSIS — R7309 Other abnormal glucose: Secondary | ICD-10-CM | POA: Diagnosis not present

## 2017-11-24 ENCOUNTER — Telehealth: Payer: Self-pay | Admitting: Pharmacist

## 2017-11-24 DIAGNOSIS — E782 Mixed hyperlipidemia: Secondary | ICD-10-CM

## 2017-11-24 NOTE — Telephone Encounter (Signed)
Spoke with patient who states that when takes in the evening he does get very tired for about 2 hours, but feels ok on current dose the next days. He has been taking rosuvastatin three times a week and feels he can continue to tolerate this dose.   Will repeat his cholesterol panel in 1 month. He is aware to come fasting after 8am on Dec 16th. Will plan to titrate dose if LDL not at goal.

## 2017-11-24 NOTE — Telephone Encounter (Signed)
Called pt to see about tolerability of rosuvastatin 5mg  three times weekly.  LMOM to call back to discuss.

## 2017-11-27 ENCOUNTER — Other Ambulatory Visit: Payer: Self-pay

## 2017-11-27 ENCOUNTER — Ambulatory Visit: Payer: PPO | Admitting: Cardiothoracic Surgery

## 2017-11-27 ENCOUNTER — Encounter: Payer: Self-pay | Admitting: Cardiothoracic Surgery

## 2017-11-27 ENCOUNTER — Ambulatory Visit
Admission: RE | Admit: 2017-11-27 | Discharge: 2017-11-27 | Disposition: A | Discharge status: Home / Self Care | Payer: PPO | Source: Ambulatory Visit | Attending: Cardiothoracic Surgery | Admitting: Cardiothoracic Surgery

## 2017-11-27 VITALS — BP 138/82 | HR 102 | Resp 20 | Ht 70.0 in | Wt 223.0 lb

## 2017-11-27 DIAGNOSIS — I712 Thoracic aortic aneurysm, without rupture, unspecified: Secondary | ICD-10-CM

## 2017-11-27 MED ORDER — IOPAMIDOL (ISOVUE-370) INJECTION 76%
75.0000 mL | Freq: Once | INTRAVENOUS | Status: AC | PRN
  Administered 2017-11-27: 75 mL via INTRAVENOUS

## 2017-11-27 NOTE — Progress Notes (Unsigned)
[address]       [place] [address]             [phone]                        Kengo E Leise Perry Medical Record #[id number] Date of Birth: [birth year]  Referring: [name], MD Primary Care: Dr Nona Dell Cardiology : Dr Ottie Glazier  Chief Complaint:    Chief Complaint  Patient presents with  . Thoracic Aortic Aneurysm    8 month f/u with CTA Chest.    History of Present Illness:    Patient is a 72 year old referred because of dilated ascending aorta noted on CT scan done at [facility] May 11 2012.  I have followed him since that time with serial CT scans .   The  patient notes that in May 2014  he was referred to [facility] for cardiology clearance prior to EAR surgery. He notes that Dr. Jimmie Molly was doing a stress echo on him but never completed it because  a dilated aortic root , moderate aortic regurgitation and  mild to moderate mitral regurgitation was noted . A CT scan of the chest was performed following the echocardiogram 05/11/2012. He notes that he know his aorta was large for "years".   The patient also he has been in atrial fibrillation for several years and has a history of at least 2 pulmonary emboli.  He now sees  Dr. Nelson/cardiology followed   Because of  atrial fibrillation and history of pulmonary emboli and AI . He was started on xarelto. He continues on Xarelto without bleeding difficulty   He does have known sleep apnea and uses BiPAP at night. He does note some fatigue  continues with good dental care seeing a dentist every 6 months.  He has had no previous history of myocardial infarction, but does have a brother who had a myocardial infarction at age 85. There is no family history of Marfan's or acute aortic dissections.   His echocardiogram demonstrates a trileaflet aortic valve with moderate aortic insufficiency LV dimensions in 4235 and diastolic 57 mm and systolic 45 for comparison in 2016 it  was 59 and 44.  Current Activity/ Functional Status:  Patient is independent with mobility/ambulation, transfers, ADL's, IADL's.  Zubrod Score: At the time of surgery this patient's most appropriate activity status/level should be described as: []  Normal activity, no symptoms [x]  Symptoms, fully ambulatory []  Symptoms, in bed less than or equal to 50% of the time []  Symptoms, in bed greater than 50% of the time but less than 100% []  Bedridden []  Moribund   Past Medical History:  Diagnosis Date  . Aortic regurgitation   . Aortic root aneurysm ([place])   . Atrial fibrillation ([place])   . BPH (benign prostatic hyperplasia)   . Hyperlipidemia   . Hypertension   . Obesity   . Pulmonary embolism ([place])   . Rhinitis   . Sleep apnea    CPAP    Past Surgical History:  Procedure Laterality Date  . APPENDECTOMY    . HERNIA REPAIR    . TONSILLECTOMY      Family History  Problem Relation Age of Onset  . Stroke Mother   . Alzheimer's disease Mother   . Heart attack Mother   . Stroke Father   . Diabetes Mellitus II Father    No family history of Marfan's  or aortic dissection.   Social History   Tobacco Use  Smoking Status Never Smoker  Smokeless Tobacco Never Used    Social History   Substance and Sexual Activity  Alcohol Use No     Allergies  Allergen Reactions  . Statins Other (See Comments)    MYALGIAS    Current Outpatient Medications  Medication Sig Dispense Refill  . acetaminophen (TYLENOL) 500 MG tablet Take 2,000 mg by mouth 2 (two) times daily.    Marland Kitchen b complex vitamins tablet Take 1 tablet by mouth daily.    . Chelated Zinc 50 MG TABS Take 1 tablet by mouth daily.    . finasteride (PROSCAR) 5 MG tablet Take 5 mg by mouth daily.    . fish oil-omega-3 fatty acids 1000 MG capsule Take 1,200 g by mouth daily.    . folic acid (FOLVITE) 536 MCG tablet Take 800 mcg by mouth daily.    . hydrochlorothiazide (HYDRODIURIL) 25 MG tablet TAKE 1 TABLET BY MOUTH DAILY  90 tablet 3  . meloxicam (MOBIC) 7.5 MG tablet Take 7.5 mg by mouth daily.    . metoprolol tartrate (LOPRESSOR) 25 MG tablet TAKE 1 TABLET BY MOUTH TWICE DAILY 180 tablet 2  . Multiple Vitamins-Minerals (MENS MULTIVITAMIN PLUS PO) Take 1 tablet by mouth daily.    . Potassium 99 MG TABS Take 1 tablet by mouth daily.    . ranitidine (ZANTAC) 75 MG tablet Take 75 mg by mouth daily.    . rosuvastatin (CRESTOR) 5 MG tablet Take 1 tablet (5mg ) three times a week and increase as tolerated. 30 tablet 3  . tamsulosin (FLOMAX) 0.4 MG CAPS capsule Take 0.4 mg by mouth daily after supper.    Marland Kitchen UNABLE TO FIND Take 8 tablets by mouth daily. Med Name:Curamin    . XARELTO 20 MG TABS tablet TAKE 1 TABLET BY MOUTH ONCE DAILY WITH SUPPER 30 tablet 11   No current facility-administered medications for this visit.        Review of Systems:  ROS     Immunizations: Flu [ refused  ]; Pneumococcal[refused  ];   Physical Exam: Ht 5\' 10"  (1.778 m)   BMI 31.57 kg/m   General appearance: alert and cooperative Head: Normocephalic, without obvious abnormality, atraumatic Neck: no adenopathy, no carotid bruit, no JVD, supple, symmetrical, trachea midline and thyroid not enlarged, symmetric, no tenderness/mass/nodules Lymph nodes: Cervical, supraclavicular, and axillary nodes normal. Resp: clear to auscultation bilaterally Back: symmetric, no curvature. ROM normal. No CVA tenderness. Cardio: regular rate and rhythm and diastolic murmur: mid diastolic 2/6, decrescendo at lower left sternal border GI: soft, non-tender; bowel sounds normal; no masses,  no organomegaly Extremities: extremities normal, atraumatic, no cyanosis or edema and Homans sign is negative, no sign of DVT Neurologic: Grossly normal   Diagnostic Studies & Laboratory data:     Recent Radiology Findings:  Ct Angio Chest Aorta W/cm &/or Wo/cm  Result Date: 11/27/2017 CLINICAL DATA:  Thoracic aortic aneurysm without rupture. EXAM: CT  ANGIOGRAPHY CHEST WITH CONTRAST TECHNIQUE: Multidetector CT imaging of the chest was performed using the standard protocol during bolus administration of intravenous contrast. Multiplanar CT image reconstructions and MIPs were obtained to evaluate the vascular anatomy. CONTRAST:  73mL ISOVUE-370 IOPAMIDOL (ISOVUE-370) INJECTION 76% COMPARISON:  CT scan of April 02, 2017. FINDINGS: Cardiovascular: Stable 4.9 cm ascending thoracic aortic aneurysm is noted. No dissection is noted. Great vessels are widely patent without significant stenosis. Transverse aortic arch measures 3 cm. Proximal descending thoracic  aorta measures 3 cm. No pericardial effusion is noted. Mild coronary artery calcifications are noted. Mediastinum/Nodes: No enlarged mediastinal, hilar, or axillary lymph nodes. Thyroid gland, trachea, and esophagus demonstrate no significant findings. Lungs/Pleura: Lungs are clear. No pleural effusion or pneumothorax. Upper Abdomen: No acute abnormality. Musculoskeletal: No chest wall abnormality. No acute or significant osseous findings. Review of the MIP images confirms the above findings. IMPRESSION: Stable 4.9 cm ascending thoracic aortic aneurysm. Recommend semi-annual imaging followup by CTA or MRA and referral to cardiothoracic surgery if not already obtained. This recommendation follows 2010 ACCF/AHA/AATS/ACR/ASA/SCA/SCAI/SIR/STS/SVM Guidelines for the Diagnosis and Management of Patients With Thoracic Aortic Disease. Circulation. 2010; 121: U765-Y650. Electronically Signed   By: Marijo Conception, M.D.   On: 11/27/2017 15:14    I have independently reviewed the above radiology studies  and reviewed the findings with the patient.   Ct Angio Chest Aorta W/cm &/or Wo/cm  Result Date: 03/21/2016 CLINICAL DATA:  Follow-up aortic root aneurysm EXAM: CT ANGIOGRAPHY CHEST WITH CONTRAST TECHNIQUE: Multidetector CT imaging of the chest was performed using the standard protocol during bolus administration of  intravenous contrast. Multiplanar CT image reconstructions and MIPs were obtained to evaluate the vascular anatomy. CONTRAST:  75 mL Isovue 370. Creatinine was obtained on site at [place] at [address]Results: Creatinine 1.1 mg/dL. COMPARISON:  02/09/2015 FINDINGS: Cardiovascular: There is prominence of the ascending aorta again identified. At the level of the sinus of Valsalva, the aorta measures approximately 4.5 cm. The ascending aorta measures approximately 4.9 cm in greatest dimension stable in appearance from the prior exam. Despite being a gated study there is some persistent motion artifact identified. Mild coronary calcifications are again seen. The aortic arch tapers in a normal fashion. The brachiocephalic vessels are within normal limits with mild atherosclerotic calcifications. The descending thoracic aorta is within normal limits. The pulmonary artery is incompletely evaluated although no large central pulmonary embolus is noted. Mediastinum/Nodes: The thoracic inlet is within normal limits. No significant hilar or mediastinal adenopathy is noted. A few scattered small lymph nodes are noted stable from the prior exam. Lungs/Pleura: Lungs are well aerated bilaterally without focal infiltrate or sizable parenchymal nodule. No effusion is seen. Upper Abdomen: Visualized upper abdomen is within normal limits. Musculoskeletal: No acute bony abnormality is noted. Review of the MIP images confirms the above findings. IMPRESSION: Stable appearance of the ascending aorta dilated to 4.9 cm in maximum dimension. Ascending thoracic aortic aneurysm. Recommend semi-annual imaging followup by CTA or MRA. This recommendation follows 2010 ACCF/AHA/AATS/ACR/ASA/SCA/SCAI/SIR/STS/SVM Guidelines for the Diagnosis and Management of Patients With Thoracic Aortic Disease. Circulation. 2010; 121: P546-F681 No other focal abnormality is seen. Electronically Signed   By: Inez Catalina M.D.   On: 03/21/2016  14:35   Ct Angio Chest Aorta W/cm &/or Wo/cm  02/09/2015  CLINICAL DATA:  Aneurysmal disease of the ascending thoracic aorta. EXAM: CT ANGIOGRAPHY CHEST WITH CONTRAST TECHNIQUE: Multidetector CT imaging of the chest was performed using the standard protocol during bolus administration of intravenous contrast. Multiplanar CT image reconstructions and MIPs were obtained to evaluate the vascular anatomy. CONTRAST:  100 mL Isovue 370 IV COMPARISON:  01/06/2014, 05/06/2013 and 05/11/2012 FINDINGS: Stable aneurysmal disease of the ascending thoracic aorta measures 4.9 cm in greatest diameter. This is stable dating back to 2014. The aorta measures 4.4 cm at the sinuses of Valsalva. The proximal arch measures 3.7 cm. The distal arch measures 3.1 cm. The descending thoracic aorta measures 2.8 cm. No evidence of aortic dissection.  Proximal great vessels show stable and normal patency. The innominate artery is tortuous. Stable cardiac enlargement. No pleural or pericardial fluid identified. Lungs show no evidence of edema, infiltrate or nodule. No enlarged lymph nodes are seen. Bony structures are unremarkable. Review of the MIP images confirms the above findings. IMPRESSION: Stable aneurysmal disease of the ascending thoracic aorta measuring 4.9 cm in greatest diameter. Electronically Signed   By: Aletta Edouard M.D.   On: 02/09/2015 10:32      Ct Angio Chest Aorta W/cm &/or Wo/cm  01/06/2014   CLINICAL DATA:  Followup ascending thoracic aortic aneurysm.  EXAM: CT ANGIOGRAPHY CHEST WITH CONTRAST  TECHNIQUE: Multidetector CT imaging of the chest was performed using the standard protocol during bolus administration of intravenous contrast. Multiplanar CT image reconstructions and MIPs were obtained to evaluate the vascular anatomy.  CONTRAST:  46mL OMNIPAQUE IOHEXOL 350 MG/ML SOLN  COMPARISON:  05/06/2013.  FINDINGS: The maximum aortic diameter at the sinus of Valsalva, sino-tubular junction and ascending thoracic  aorta are 4.0 cm, 4.6 cm and 4.4 cm respectively. These were previously 4.5 cm, 4.9 cm and 4.6 cm respectively, measured at the same levels.  The degree of dilatation of the left atrium is unchanged. Minimal residual atelectasis or scarring at both lung bases. No lung nodules or enlarged lymph nodes. Stable mild narrowing of the origin of the celiac axis. Mild thoracic spine degenerative changes.  Review of the MIP images confirms the above findings.  IMPRESSION: Interval mild decrease in size of the previously demonstrated dilated aortic root, sino-tubular junction and ascending thoracic aorta. The maximum diameter today is 4.6 cm at the sino-tubular junction.   Electronically Signed   By: Enrique Sack M.D.   On: 01/06/2014 11:56     Ct Angio Chest Aorta W/cm &/or Wo/cm  05/06/2013   CLINICAL DATA:  Aortic root evaluation  EXAM: CT ANGIOGRAPHY CHEST WITH CONTRAST  TECHNIQUE: Multidetector CT imaging of the chest was performed using the standard protocol during bolus administration of intravenous contrast. Multiplanar CT image reconstructions and MIPs were obtained to evaluate the vascular anatomy.  CONTRAST:  80mL OMNIPAQUE IOHEXOL 350 MG/ML SOLN  COMPARISON:  None.  FINDINGS: Maximal aortic diameter is at the sinus of Valsalva, sino-tubular junction, and ascending aorta are 4.5 cm, 4.9 cm, and 4.6 cm respectively. Mild coronary artery calcifications in the circumflex and LAD coronary arteries. Minimal right coronary artery calcifications.  Great vessels are patent. Vertebral arteries are patent within the confines of the exam.  There is mild dilatation of the left atrium. Very minimal peripheral aortic valve calcifications.  Limited images of the abdomen demonstrate mild narrowing at the origin of the celiac axis and patency at the origin of the SMA.  No abnormal mediastinal adenopathy.  No mediastinal mass effect.  Bibasilar atelectasis.  No pneumothorax or pleural effusion. Chronic right upper rib  deformities. No definite acute bony deformity. Posterior disc osteophytes at C6-7 with severe disc space narrowing.  Review of the MIP images confirms the above findings.  IMPRESSION: There is significant dilatation of the aortic root as described. The sino-tubular junction is 4.9 cm in diameter. No evidence of dissection. Minimal peripheral aortic valvular calcifications.   Electronically Signed   By: Maryclare Bean M.D.   On: 05/06/2013 16:21   05/11/2012 CT of Chest: CT ANGIOGRAPHY CHEST Comparison: CT scan of June 20, 2008. Findings: No significant abnormality seen involving the pulmonary parenchyma. No pleural effusions or pneumothorax are noted. Normal contrast filling of pulmonary arteries  is noted. There is no evidence of aortic dissection. Coronary artery calcifications are noted consistent with coronary artery disease. There is mild dilatation of the ascending thoracic aorta is again noted which now measures 5.0 cm which is slightly increased compared to prior exam of 2010. No mass or adenopathy is noted in the mediastinum. Visualized portions of upper abdomen are within normal limits.  IMPRESSION: No evidence of thoracic aortic dissection is noted. Coronary artery calcifications are noted suggesting coronary artery disease. Aneurysmal dilatation of the ascending thoracic aorta is noted with maximum measured diameter of 5.0 cm which has increased compared to prior exam of 2010. Original Report Authenticated By: Marijo Conception., M.D.    02/2006 CT of Chest Satisfactory contrast-opacification of pulmonary vasculature. Right upper lobe evidence for pulmonary thromboembolism. Ascending aorta measures 4.4 cm transverse by 4.7 cm AP. Descending aorta measures 3.2 cm transverse by 3.1 cm AP. No acute pulmonary infiltrate. No pleural effusion. No adenopathy. Normal cardiac size. 11/02/2007 CT of Chest: Findings: The chest wall is stable. No supraclavicular or axillary adenopathy. The  thyroid gland appears normal.  The heart is borderline enlarged but stable. No pericardial effusion. Stable small scattered mediastinal and hilar lymph nodes.  No change and fusiform aneurysmal dilatation of the ascending aorta with maximal measurements of 4.7 x 4.7 cm. No dissection. The major branch vessels are patent. The esophagus is grossly normal. There is a filling defect and a right lower lobe pulmonary artery consistent with pulmonary embolism. There is tortuosity of the descending thoracic aorta.  Examination of the lung parenchyma demonstrates minimal lingular scarring change. The airspace consolidation has resolved and this may be post pneumonic scarring. No infiltrates, edema or effusions. No interstitial lung disease. No pulmonary masses or nodules.  The upper abdomen is stable. There is a small left adrenal gland nodule which is likely a benign adenoma.  IMPRESSION:  1. Incidental pulmonary embolism is noted in a right lower lobe pulmonary artery. 2. Near complete resolution of left lingular process. There is minimal residual probable post pneumonic scarring changes. No acute pulmonary findings or pulmonary masses. 3. Stable ascending thoracic aortic aneurysm. 4. Borderline cardiac size, stable. 5. Stable small mediastinal and hilar lymph nodes. 6. Stable left adrenal gland nodule, likely benign adenoma. 7. Stable periportal and celiac axis lymph nodes.  06/20/2008 CT of Chest: Comparison: CT chest without and with contrast 11/02/2007. Findings: Measured in the axial plane at a level approximately 2.5 cm above the aortic valve on series 4, image 30, maximum diameter of the ascending thoracic aorta remains unchanged at 4.7 cm. Tortuous aortic arch and proximal great vessels without significant atherosclerosis. Mild descending thoracic and upper abdominal aortic atherosclerosis. No evidence of aortic dissection. Heart mildly enlarged but stable. No visible  coronary artery calcification. No pericardial effusion. Interval resolution of the segmental right lower lobe of pulmonary embolus; no central pulmonary emboli currently.  Stable scattered normal-sized mediastinal and hilar lymph nodes; no new, enlarging, or suspicious lymphadenopathy. Normal thyroid gland. Minimal scarring in the lingula and in the lower lobes. Lungs otherwise clear without localized airspace consolidation, interstitial disease, or nodularity. High resolution expiratory images demonstrate scattered areas of hyperlucency consistent with localized air trapping. Stable approximate 1.1 cm left adrenal adenoma. Normal-sized porta hepatis and celiac axis lymph nodes again noted and unchanged. Remaining visualized upper abdomen unremarkable for the early arterial phase of enhancement. Bone window images again demonstrate mild thoracic spondylosis.    ECHO: 02/2017  ------------------------------------------------------------------- Echocardiography  Patient:  Bakari, Nikolai MR #:       [id number] Study Date: 02/13/2017 Gender:     M Age:        66 Height:     177.8 cm Weight:     99.3 kg BSA:        2.24 m^2 Pt. Status: Room:   SONOGRAPHER  Marygrace Drought, RCS  ORDERING     [name], M.D.  REFERRING    [name], M.D.  PERFORMING   [facility], Outpatient  ATTENDING    Skeet Latch, MD  cc:  ------------------------------------------------------------------- LV EF: 50% -   55%  ------------------------------------------------------------------- Indications:      Aortic Valve Disorder (I35.1).  ------------------------------------------------------------------- History:   PMH:  Hyperlipidemia, AAA, sleep apnea.  Coronary artery disease.  Risk factors:  Hypertension.  ------------------------------------------------------------------- Study Conclusions  - Left ventricle: The cavity size was mildly dilated. Wall   thickness was  increased in a pattern of moderate LVH. Systolic   function was normal. The estimated ejection fraction was in the   range of 50% to 55%. Wall motion was normal; there were no   regional wall motion abnormalities. Doppler parameters are   consistent with indeterminate ventricular filling pressure. - Aortic valve: Transvalvular velocity was within the normal range.   There was no stenosis. There was moderate regurgitation.   Regurgitation pressure half-time: 425 ms. - Ascending aorta: The ascending aorta was moderately dilated. - Mitral valve: There was mild regurgitation. - Left atrium: The atrium was severely dilated. - Right ventricle: The cavity size was normal. Wall thickness was   normal. Systolic function was normal. - Atrial septum: No defect or patent foramen ovale was identified. - Tricuspid valve: There was mild regurgitation. - Pulmonary arteries: Systolic pressure was within the normal   range. PA peak pressure: 27 mm Hg (S).  ------------------------------------------------------------------- Study data:  Comparison was made to the study of 11/16/2015.  Study status:  Routine.  Procedure:  The patient reported no pain pre or post test. Transthoracic echocardiography. Image quality was adequate.          Echocardiography.  M-mode, complete 2D, spectral Doppler, and color Doppler.  Birthdate:  Patient birthdate: 1945-05-15.  Age:  Patient is 72 yr old.  Sex:  Gender: male. BMI: 31.4 kg/m^2.  Blood pressure:     132/82  Patient status: Outpatient.  Study date:  Study date: 02/13/2017. Study time: 11:40 AM.  Location:  [facility]  -------------------------------------------------------------------  ------------------------------------------------------------------- Left ventricle:  The cavity size was mildly dilated. Wall thickness was increased in a pattern of moderate LVH. Systolic function was normal. The estimated ejection fraction was in the range of 50%  to 55%. Wall motion was normal; there were no regional wall motion abnormalities. The study was not technically sufficient to allow evaluation of LV diastolic dysfunction due to atrial fibrillation. Doppler parameters are consistent with indeterminate ventricular filling pressure.  ------------------------------------------------------------------- Aortic valve:   Trileaflet; normal thickness leaflets. Mobility was not restricted.  Doppler:  Transvalvular velocity was within the normal range. There was no stenosis. There was moderate regurgitation.  ------------------------------------------------------------------- Aorta:  Aortic root: The aortic root was normal in size. Ascending aorta: The ascending aorta was moderately dilated.  ------------------------------------------------------------------- Mitral valve:   Structurally normal valve.   Mobility was not restricted.  Doppler:  Transvalvular velocity was within the normal range. There was no evidence for stenosis. There was mild regurgitation.    Peak gradient (D): 4 mm Hg.  ------------------------------------------------------------------- Left atrium:  The atrium was severely dilated.  ------------------------------------------------------------------- Atrial septum:  No defect or patent foramen ovale was identified.   ------------------------------------------------------------------- Right ventricle:  The cavity size was normal. Wall thickness was normal. Systolic function was normal.  ------------------------------------------------------------------- Pulmonic valve:    Doppler:  Transvalvular velocity was within the normal range. There was no evidence for stenosis.  ------------------------------------------------------------------- Tricuspid valve:   Structurally normal valve.    Doppler: Transvalvular velocity was within the normal range. There was  mild regurgitation.  ------------------------------------------------------------------- Pulmonary artery:   The main pulmonary artery was normal-sized. Systolic pressure was within the normal range.  ------------------------------------------------------------------- Right atrium:  The atrium was normal in size.  ------------------------------------------------------------------- Pericardium:  There was no pericardial effusion.  ------------------------------------------------------------------- Systemic veins: Inferior vena cava: The vessel was normal in size. The respirophasic diameter changes were in the normal range (= 50%), consistent with normal central venous pressure. Diameter: 20 mm.  ------------------------------------------------------------------- Measurements   IVC                                        Value        Reference  ID                                         20    mm     ----------    Left ventricle                             Value        Reference  LV ID, ED, PLAX chordal            (H)     57.2  mm     43 - 52  LV ID, ES, PLAX chordal            (H)     45    mm     23 - 38  LV fx shortening, PLAX chordal     (L)     21    %      >=29  LV PW thickness, ED                        14.1  mm     ----------  IVS/LV PW ratio, ED                        0.89         <=1.3  Stroke volume, 2D                          100   ml     ----------  Stroke volume/bsa, 2D                      45    ml/m^2 ----------  LV e&', lateral                             13.5  cm/s   ----------  LV E/e&', lateral  7.63         ----------  LV e&', medial                              8.49  cm/s   ----------  LV E/e&', medial                            12.13        ----------  LV e&', average                             11    cm/s   ----------  LV E/e&', average                           9.37         ----------    Ventricular septum                          Value        Reference  IVS thickness, ED                          12.5  mm     ----------    LVOT                                       Value        Reference  LVOT ID, S                                 24    mm     ----------  LVOT area                                  4.52  cm^2   ----------  LVOT peak velocity, S                      105   cm/s   ----------  LVOT mean velocity, S                      73.4  cm/s   ----------  LVOT VTI, S                                22.1  cm     ----------    Aortic valve                               Value        Reference  Aortic regurg pressure half-time           425   ms     ----------    Aorta  Value        Reference  Aortic root ID, ED                         43    mm     ----------  Ascending aorta ID, A-P, S                 4.6   mm     ----------    Left atrium                                Value        Reference  LA ID, A-P, ES                             40    mm     ----------  LA ID/bsa, A-P                             1.78  cm/m^2 <=2.2  LA volume, S                               156   ml     ----------  LA volume/bsa, S                           69.5  ml/m^2 ----------  LA volume, ES, 1-p A4C                     145   ml     ----------  LA volume/bsa, ES, 1-p A4C                 64.6  ml/m^2 ----------  LA volume, ES, 1-p A2C                     158   ml     ----------  LA volume/bsa, ES, 1-p A2C                 70.4  ml/m^2 ----------    Mitral valve                               Value        Reference  Mitral E-wave peak velocity                103   cm/s   ----------  Mitral deceleration time                   162   ms     150 - 230  Mitral peak gradient, D                    4     mm Hg  ----------  Mitral regurg VTI, PISA                    158   cm     ----------  Mitral ERO, PISA  0.14  cm^2   ----------  Mitral regurg volume, PISA                 22    ml      ----------    Pulmonary arteries                         Value        Reference  PA pressure, S, DP                         27    mm Hg  <=30    Tricuspid valve                            Value        Reference  Tricuspid regurg peak velocity             246   cm/s   ----------  Tricuspid peak RV-RA gradient              24    mm Hg  ----------  Tricuspid maximal regurg velocity,         246   cm/s   ----------  PISA    Right atrium                               Value        Reference  RA ID, S-I, ES, A4C                (H)     68.1  mm     34 - 49  RA area, ES, A4C                   (H)     20.7  cm^2   8.3 - 19.5  RA volume, ES, A/L                         53.1  ml     ----------  RA volume/bsa, ES, A/L                     23.7  ml/m^2 ----------    Systemic veins                             Value        Reference  Estimated CVP                              3     mm Hg  ----------    Right ventricle                            Value        Reference  TAPSE                                      20.8  mm     ----------  RV pressure, S, DP  27    mm Hg  <=30  RV s&', lateral, S                          8.7   cm/s   ----------  Legend: (L)  and  (H)  mark values outside specified reference range.  ------------------------------------------------------------------- Prepared and Electronically Authenticated by  Skeet Latch, MD 2019-02-07T15:37:44  PFT's: FEV1 2.08  61% +10% post treatment  DLCO 115 %   Study Highlights     Nuclear stress EF: 49%.  There was no ST segment deviation noted during stress.  This is a low risk study.  The left ventricular ejection fraction is mildly decreased (45-54%).   Low risk stress nuclear study with normal perfusion mildly dilated left ventricle with mildly reduced global systolic function. Findings suggest nonischemic cardiomyopathy.      Assessment / Plan:     1)  Dilated ascending aorta, chronic  4.7 cm in 2008 now 4.9 cm today  ,                    Aortic Size Index=    4.9     /Body surface area is 2.27 meters squared. = 2.15                                                      < 2.75 cm/m2      4% risk per year                                                       2.75 to 4.25          8% risk per year                                                      > 4.25 cm/m2    20% risk per year  #2 valvular heart disease by echo report: Trileaflet aortic valve  Currently the degree of aortic insufficiency is  mild moderate there also mild left ventricular dilatation (LVEDD = 57, 59 in 2016   #3 chronic atrial fibrillation: now on anticoagulation #4 extensive coronary artery calcification on CT scan, positive family history of early coronary artery disease, no previous history of ischemic events, no current symptoms #5 history of pulmonary emboli x2, no history of workup for hypercoagulable state-on Xarelto #6 obstructive sleep apnea/Moderaltly Severe Airflow limitation #7 hyperlipidemia  #8 benign prostatic hypertrophy #9 history of hypertension, he is currently on a beta blocker     I have reviewed with him the most recent CT scan and recommended that we repeat surveillance of the size of his aorta with a CT of the chest in 8 months with follow-up echocardiogram also.   I reviewed with him the signs and symptoms of aortic dissection,  We discussed proceeding with elective aortic replacement with aorta in the vicinity of 5.5 cm or if  develops worsening aortic insufficiency and requires aortic valve replacement.  Patient was warned about not using Cipro and similar antibiotics. Recent studies have raised concern that fluoroquinolone antibiotics could be associated with an increased risk of aortic aneurysm Fluoroquinolones have non-antimicrobial properties that might jeopardise the integrity of the extracellular matrix of the vascular wall In a  propensity score matched cohort study in  [place], there was a 66% increased rate of aortic aneurysm or dissection associated with oral fluoroquinolone use, compared with amoxicillin use, within a 60 day risk period from start of treatment    Grace Isaac MD      [address] [place] [address] Office [phone]   Beeper [phone]  11/27/2017 3:24 PM

## 2017-12-22 ENCOUNTER — Other Ambulatory Visit: Payer: PPO | Admitting: *Deleted

## 2017-12-22 DIAGNOSIS — E782 Mixed hyperlipidemia: Secondary | ICD-10-CM | POA: Diagnosis not present

## 2017-12-22 LAB — HEPATIC FUNCTION PANEL
ALT: 13 IU/L (ref 0–44)
AST: 18 IU/L (ref 0–40)
Albumin: 4.1 g/dL (ref 3.5–4.8)
Alkaline Phosphatase: 62 IU/L (ref 39–117)
Bilirubin Total: 0.5 mg/dL (ref 0.0–1.2)
Bilirubin, Direct: 0.16 mg/dL (ref 0.00–0.40)
Total Protein: 6.4 g/dL (ref 6.0–8.5)

## 2017-12-22 LAB — LIPID PANEL
Chol/HDL Ratio: 4 ratio (ref 0.0–5.0)
Cholesterol, Total: 144 mg/dL (ref 100–199)
HDL: 36 mg/dL — ABNORMAL LOW (ref >39)
LDL Calculated: 84 mg/dL (ref 0–99)
Triglycerides: 121 mg/dL (ref 0–149)
VLDL Cholesterol Cal: 24 mg/dL (ref 5–40)

## 2017-12-23 ENCOUNTER — Telehealth: Payer: Self-pay | Admitting: *Deleted

## 2017-12-23 NOTE — Telephone Encounter (Signed)
-----   Message from Nuala Alpha, LPN sent at 60/10/9321  8:05 AM EST -----  ----- Message ----- From: Dorothy Spark, MD Sent: 12/22/2017  11:09 PM EST To: Nuala Alpha, LPN  Great lipids and LFTs

## 2017-12-23 NOTE — Telephone Encounter (Signed)
Left message to go over lab results. DPR on file is for [facility] and PCP.

## 2017-12-25 DIAGNOSIS — R52 Pain, unspecified: Secondary | ICD-10-CM | POA: Diagnosis not present

## 2017-12-25 DIAGNOSIS — Z01818 Encounter for other preprocedural examination: Secondary | ICD-10-CM | POA: Diagnosis not present

## 2017-12-25 DIAGNOSIS — Z79899 Other long term (current) drug therapy: Secondary | ICD-10-CM | POA: Diagnosis not present

## 2017-12-25 DIAGNOSIS — M1711 Unilateral primary osteoarthritis, right knee: Secondary | ICD-10-CM | POA: Diagnosis not present

## 2017-12-25 DIAGNOSIS — M79609 Pain in unspecified limb: Secondary | ICD-10-CM | POA: Diagnosis not present

## 2018-01-14 DIAGNOSIS — G4733 Obstructive sleep apnea (adult) (pediatric): Secondary | ICD-10-CM | POA: Diagnosis not present

## 2018-01-14 DIAGNOSIS — E669 Obesity, unspecified: Secondary | ICD-10-CM | POA: Diagnosis not present

## 2018-01-14 DIAGNOSIS — Z888 Allergy status to other drugs, medicaments and biological substances status: Secondary | ICD-10-CM | POA: Diagnosis not present

## 2018-01-14 DIAGNOSIS — D72829 Elevated white blood cell count, unspecified: Secondary | ICD-10-CM | POA: Diagnosis not present

## 2018-01-14 DIAGNOSIS — Z471 Aftercare following joint replacement surgery: Secondary | ICD-10-CM | POA: Diagnosis not present

## 2018-01-14 DIAGNOSIS — I10 Essential (primary) hypertension: Secondary | ICD-10-CM | POA: Diagnosis not present

## 2018-01-14 DIAGNOSIS — Z6832 Body mass index (BMI) 32.0-32.9, adult: Secondary | ICD-10-CM | POA: Diagnosis not present

## 2018-01-14 DIAGNOSIS — N400 Enlarged prostate without lower urinary tract symptoms: Secondary | ICD-10-CM | POA: Diagnosis not present

## 2018-01-14 DIAGNOSIS — M1711 Unilateral primary osteoarthritis, right knee: Secondary | ICD-10-CM | POA: Diagnosis not present

## 2018-01-14 DIAGNOSIS — I4891 Unspecified atrial fibrillation: Secondary | ICD-10-CM | POA: Diagnosis not present

## 2018-01-14 DIAGNOSIS — Z96651 Presence of right artificial knee joint: Secondary | ICD-10-CM | POA: Diagnosis not present

## 2018-01-14 DIAGNOSIS — Z79899 Other long term (current) drug therapy: Secondary | ICD-10-CM | POA: Diagnosis not present

## 2018-01-14 DIAGNOSIS — Z7902 Long term (current) use of antithrombotics/antiplatelets: Secondary | ICD-10-CM | POA: Diagnosis not present

## 2018-01-14 DIAGNOSIS — E785 Hyperlipidemia, unspecified: Secondary | ICD-10-CM | POA: Diagnosis not present

## 2018-01-14 DIAGNOSIS — Z86711 Personal history of pulmonary embolism: Secondary | ICD-10-CM | POA: Diagnosis not present

## 2018-01-14 DIAGNOSIS — G8918 Other acute postprocedural pain: Secondary | ICD-10-CM | POA: Diagnosis not present

## 2018-01-14 DIAGNOSIS — I351 Nonrheumatic aortic (valve) insufficiency: Secondary | ICD-10-CM | POA: Diagnosis not present

## 2018-01-14 DIAGNOSIS — I719 Aortic aneurysm of unspecified site, without rupture: Secondary | ICD-10-CM | POA: Diagnosis not present

## 2018-01-17 DIAGNOSIS — I10 Essential (primary) hypertension: Secondary | ICD-10-CM | POA: Diagnosis not present

## 2018-01-17 DIAGNOSIS — I719 Aortic aneurysm of unspecified site, without rupture: Secondary | ICD-10-CM | POA: Diagnosis not present

## 2018-01-17 DIAGNOSIS — Z471 Aftercare following joint replacement surgery: Secondary | ICD-10-CM | POA: Diagnosis not present

## 2018-01-17 DIAGNOSIS — Z86711 Personal history of pulmonary embolism: Secondary | ICD-10-CM | POA: Diagnosis not present

## 2018-01-17 DIAGNOSIS — I4891 Unspecified atrial fibrillation: Secondary | ICD-10-CM | POA: Diagnosis not present

## 2018-01-17 DIAGNOSIS — E669 Obesity, unspecified: Secondary | ICD-10-CM | POA: Diagnosis not present

## 2018-01-17 DIAGNOSIS — Z9181 History of falling: Secondary | ICD-10-CM | POA: Diagnosis not present

## 2018-01-17 DIAGNOSIS — E785 Hyperlipidemia, unspecified: Secondary | ICD-10-CM | POA: Diagnosis not present

## 2018-01-17 DIAGNOSIS — N400 Enlarged prostate without lower urinary tract symptoms: Secondary | ICD-10-CM | POA: Diagnosis not present

## 2018-01-17 DIAGNOSIS — Z96651 Presence of right artificial knee joint: Secondary | ICD-10-CM | POA: Diagnosis not present

## 2018-01-17 DIAGNOSIS — K219 Gastro-esophageal reflux disease without esophagitis: Secondary | ICD-10-CM | POA: Diagnosis not present

## 2018-01-17 DIAGNOSIS — Z6832 Body mass index (BMI) 32.0-32.9, adult: Secondary | ICD-10-CM | POA: Diagnosis not present

## 2018-01-17 DIAGNOSIS — Z7901 Long term (current) use of anticoagulants: Secondary | ICD-10-CM | POA: Diagnosis not present

## 2018-01-17 DIAGNOSIS — K5790 Diverticulosis of intestine, part unspecified, without perforation or abscess without bleeding: Secondary | ICD-10-CM | POA: Diagnosis not present

## 2018-01-17 DIAGNOSIS — I340 Nonrheumatic mitral (valve) insufficiency: Secondary | ICD-10-CM | POA: Diagnosis not present

## 2018-01-28 DIAGNOSIS — R2689 Other abnormalities of gait and mobility: Secondary | ICD-10-CM | POA: Diagnosis not present

## 2018-01-28 DIAGNOSIS — Z96651 Presence of right artificial knee joint: Secondary | ICD-10-CM | POA: Diagnosis not present

## 2018-02-03 DIAGNOSIS — R2689 Other abnormalities of gait and mobility: Secondary | ICD-10-CM | POA: Diagnosis not present

## 2018-02-03 DIAGNOSIS — Z96651 Presence of right artificial knee joint: Secondary | ICD-10-CM | POA: Diagnosis not present

## 2018-02-05 DIAGNOSIS — R2689 Other abnormalities of gait and mobility: Secondary | ICD-10-CM | POA: Diagnosis not present

## 2018-02-05 DIAGNOSIS — Z96651 Presence of right artificial knee joint: Secondary | ICD-10-CM | POA: Diagnosis not present

## 2018-02-10 DIAGNOSIS — R2689 Other abnormalities of gait and mobility: Secondary | ICD-10-CM | POA: Diagnosis not present

## 2018-02-10 DIAGNOSIS — Z96651 Presence of right artificial knee joint: Secondary | ICD-10-CM | POA: Diagnosis not present

## 2018-02-12 DIAGNOSIS — R2689 Other abnormalities of gait and mobility: Secondary | ICD-10-CM | POA: Diagnosis not present

## 2018-02-12 DIAGNOSIS — Z96651 Presence of right artificial knee joint: Secondary | ICD-10-CM | POA: Diagnosis not present

## 2018-02-17 DIAGNOSIS — Z96651 Presence of right artificial knee joint: Secondary | ICD-10-CM | POA: Diagnosis not present

## 2018-02-17 DIAGNOSIS — R2689 Other abnormalities of gait and mobility: Secondary | ICD-10-CM | POA: Diagnosis not present

## 2018-02-19 DIAGNOSIS — R2689 Other abnormalities of gait and mobility: Secondary | ICD-10-CM | POA: Diagnosis not present

## 2018-02-19 DIAGNOSIS — Z96651 Presence of right artificial knee joint: Secondary | ICD-10-CM | POA: Diagnosis not present

## 2018-02-23 ENCOUNTER — Other Ambulatory Visit: Payer: Self-pay | Admitting: Cardiology

## 2018-02-24 DIAGNOSIS — R2689 Other abnormalities of gait and mobility: Secondary | ICD-10-CM | POA: Diagnosis not present

## 2018-02-24 DIAGNOSIS — Z96651 Presence of right artificial knee joint: Secondary | ICD-10-CM | POA: Diagnosis not present

## 2018-02-26 DIAGNOSIS — Z96651 Presence of right artificial knee joint: Secondary | ICD-10-CM | POA: Diagnosis not present

## 2018-02-26 DIAGNOSIS — R2689 Other abnormalities of gait and mobility: Secondary | ICD-10-CM | POA: Diagnosis not present

## 2018-03-03 DIAGNOSIS — Z96651 Presence of right artificial knee joint: Secondary | ICD-10-CM | POA: Diagnosis not present

## 2018-03-03 DIAGNOSIS — R2689 Other abnormalities of gait and mobility: Secondary | ICD-10-CM | POA: Diagnosis not present

## 2018-03-05 DIAGNOSIS — R2689 Other abnormalities of gait and mobility: Secondary | ICD-10-CM | POA: Diagnosis not present

## 2018-03-05 DIAGNOSIS — Z96651 Presence of right artificial knee joint: Secondary | ICD-10-CM | POA: Diagnosis not present

## 2018-03-10 DIAGNOSIS — Z96651 Presence of right artificial knee joint: Secondary | ICD-10-CM | POA: Diagnosis not present

## 2018-03-10 DIAGNOSIS — R2689 Other abnormalities of gait and mobility: Secondary | ICD-10-CM | POA: Diagnosis not present

## 2018-03-12 DIAGNOSIS — R2689 Other abnormalities of gait and mobility: Secondary | ICD-10-CM | POA: Diagnosis not present

## 2018-03-12 DIAGNOSIS — Z96651 Presence of right artificial knee joint: Secondary | ICD-10-CM | POA: Diagnosis not present

## 2018-03-30 DIAGNOSIS — H6122 Impacted cerumen, left ear: Secondary | ICD-10-CM | POA: Diagnosis not present

## 2018-03-30 DIAGNOSIS — I10 Essential (primary) hypertension: Secondary | ICD-10-CM | POA: Diagnosis not present

## 2018-04-07 DIAGNOSIS — M1711 Unilateral primary osteoarthritis, right knee: Secondary | ICD-10-CM | POA: Diagnosis not present

## 2018-04-15 ENCOUNTER — Other Ambulatory Visit: Payer: Self-pay | Admitting: *Deleted

## 2018-04-15 DIAGNOSIS — I712 Thoracic aortic aneurysm, without rupture, unspecified: Secondary | ICD-10-CM

## 2018-04-16 ENCOUNTER — Other Ambulatory Visit: Payer: Self-pay | Admitting: *Deleted

## 2018-05-18 ENCOUNTER — Other Ambulatory Visit: Payer: Self-pay | Admitting: Cardiology

## 2018-05-18 MED ORDER — ROSUVASTATIN CALCIUM 5 MG PO TABS: ORAL_TABLET | ORAL | 5 refills | Status: DC

## 2018-05-25 ENCOUNTER — Other Ambulatory Visit: Payer: Self-pay | Admitting: Cardiology

## 2018-05-28 DIAGNOSIS — H25811 Combined forms of age-related cataract, right eye: Secondary | ICD-10-CM | POA: Diagnosis not present

## 2018-05-28 DIAGNOSIS — H2512 Age-related nuclear cataract, left eye: Secondary | ICD-10-CM | POA: Diagnosis not present

## 2018-05-28 DIAGNOSIS — H25812 Combined forms of age-related cataract, left eye: Secondary | ICD-10-CM | POA: Diagnosis not present

## 2018-06-11 DIAGNOSIS — H2511 Age-related nuclear cataract, right eye: Secondary | ICD-10-CM | POA: Diagnosis not present

## 2018-06-11 DIAGNOSIS — H25811 Combined forms of age-related cataract, right eye: Secondary | ICD-10-CM | POA: Diagnosis not present

## 2018-06-19 DIAGNOSIS — Z96651 Presence of right artificial knee joint: Secondary | ICD-10-CM | POA: Diagnosis not present

## 2018-06-22 ENCOUNTER — Other Ambulatory Visit: Payer: Self-pay | Admitting: Cardiology

## 2018-06-23 NOTE — Telephone Encounter (Signed)
Xarelto 20mg  refill request received; pt is 73 yrs old, wt-101.2kg, Crea-1.10 via [place] at [facility], last seen by Estella Husk on 10/14/2017, CrCl-88.49mll/min; refill sent.

## 2018-07-02 DIAGNOSIS — I10 Essential (primary) hypertension: Secondary | ICD-10-CM | POA: Diagnosis not present

## 2018-07-09 ENCOUNTER — Other Ambulatory Visit: Payer: PPO

## 2018-07-09 ENCOUNTER — Ambulatory Visit: Payer: PPO | Admitting: Cardiothoracic Surgery

## 2018-07-15 DIAGNOSIS — Z961 Presence of intraocular lens: Secondary | ICD-10-CM | POA: Diagnosis not present

## 2018-08-06 ENCOUNTER — Other Ambulatory Visit: Payer: Self-pay | Admitting: *Deleted

## 2018-08-06 ENCOUNTER — Ambulatory Visit
Admission: RE | Admit: 2018-08-06 | Discharge: 2018-08-06 | Disposition: A | Discharge status: Home / Self Care | Payer: PPO | Source: Ambulatory Visit | Attending: Cardiothoracic Surgery | Admitting: Cardiothoracic Surgery

## 2018-08-06 ENCOUNTER — Encounter: Payer: Self-pay | Admitting: Cardiothoracic Surgery

## 2018-08-06 ENCOUNTER — Ambulatory Visit (INDEPENDENT_AMBULATORY_CARE_PROVIDER_SITE_OTHER): Payer: PPO | Admitting: Cardiothoracic Surgery

## 2018-08-06 ENCOUNTER — Other Ambulatory Visit: Payer: Self-pay

## 2018-08-06 VITALS — BP 132/80 | HR 88 | Temp 97.7°F | Resp 20 | Ht 70.0 in | Wt 214.0 lb

## 2018-08-06 DIAGNOSIS — I712 Thoracic aortic aneurysm, without rupture, unspecified: Secondary | ICD-10-CM

## 2018-08-06 DIAGNOSIS — I7121 Aneurysm of the ascending aorta, without rupture: Secondary | ICD-10-CM

## 2018-08-06 DIAGNOSIS — I351 Nonrheumatic aortic (valve) insufficiency: Secondary | ICD-10-CM

## 2018-08-06 MED ORDER — IOPAMIDOL (ISOVUE-370) INJECTION 76%
75.0000 mL | Freq: Once | INTRAVENOUS | Status: AC | PRN
  Administered 2018-08-06: 75 mL via INTRAVENOUS

## 2018-08-06 NOTE — Progress Notes (Signed)
[address]       [place] [address]             [phone]                        Ancelmo E Rendell [facility] Medical Record #[id number] Date of Birth: [birth year]  Referring: [name], MD Primary Care: Dr Nona Dell Cardiology : Dr Ottie Glazier  Chief Complaint:    Chief Complaint  Patient presents with  . Thoracic Aortic Aneurysm    7 month f/u with Chest CTA    History of Present Illness:    Patient is a 73 year old referred because of dilated ascending aorta noted on CT scan done at [facility] May 11 2012.  I have followed him since that time with serial CT scans .   The  patient notes that in May 2014  he was referred to [facility] for cardiology clearance prior to EAR surgery. He notes that Dr. Jimmie Molly was doing a stress echo on him but never completed it because  a dilated aortic root , moderate aortic regurgitation and  mild to moderate mitral regurgitation was noted . A CT scan of the chest was performed following the echocardiogram 05/11/2012. He notes that he know his aorta was large for "years".   The patient also he has been in atrial fibrillation for several years and has a history of at least 2 pulmonary emboli.  He now sees  Dr. Nelson/cardiology followed   Because of  atrial fibrillation and history of pulmonary emboli and AI . He was started on xarelto. He continues on Xarelto without bleeding difficulty   He does have known sleep apnea and uses BiPAP at night. He does note some fatigue  continues with good dental care seeing a dentist every 6 months.  He has had no previous history of myocardial infarction, but does have a brother who had a myocardial infarction at age 70. There is no family history of Marfan's or acute aortic dissections.   His echocardiogram demonstrates a trileaflet aortic valve with moderate aortic insufficiency LV dimensions in 3151 and diastolic 57 mm and systolic 45 for comparison in 2016 it  was 59 and 44.  His last echocardiogram was in February 2019, he did have cardiac clearance for right knee replacement with nuclear stress test in November 2019.  He has not been seen by cardiology since.  He notes that since his knee replacement was done he is actually been more active without pain in his knee.  He denies shortness of breath pedal edema orthopnea or angina.  Current Activity/ Functional Status:  Patient is independent with mobility/ambulation, transfers, ADL's, IADL's.  Zubrod Score: At the time of surgery this patient's most appropriate activity status/level should be described as: []  Normal activity, no symptoms [x]  Symptoms, fully ambulatory []  Symptoms, in bed less than or equal to 50% of the time []  Symptoms, in bed greater than 50% of the time but less than 100% []  Bedridden []  Moribund   Past Medical History:  Diagnosis Date  . Aortic regurgitation   . Aortic root aneurysm ([place])   . Atrial fibrillation ([place])   . BPH (benign prostatic hyperplasia)   . Hyperlipidemia   . Hypertension   . Obesity   . Pulmonary embolism ([place])   . Rhinitis   . Sleep apnea    CPAP    Past Surgical History:  Procedure  Laterality Date  . APPENDECTOMY    . HERNIA REPAIR    . TONSILLECTOMY      Family History  Problem Relation Age of Onset  . Stroke Mother   . Alzheimer's disease Mother   . Heart attack Mother   . Stroke Father   . Diabetes Mellitus II Father    No family history of Marfan's or aortic dissection.   Social History   Tobacco Use  Smoking Status Never Smoker  Smokeless Tobacco Never Used    Social History   Substance and Sexual Activity  Alcohol Use No     Allergies  Allergen Reactions  . Statins Other (See Comments)    MYALGIAS    Current Outpatient Medications  Medication Sig Dispense Refill  . acetaminophen (TYLENOL) 500 MG tablet Take 2,000 mg by mouth daily as needed.     Marland Kitchen b complex vitamins tablet Take 1 tablet by mouth  daily.    . Chelated Zinc 50 MG TABS Take 1 tablet by mouth daily.    . finasteride (PROSCAR) 5 MG tablet Take 5 mg by mouth daily.    . fish oil-omega-3 fatty acids 1000 MG capsule Take 1,200 g by mouth daily.    . folic acid (FOLVITE) 300 MCG tablet Take 800 mcg by mouth daily.    . hydrochlorothiazide (HYDRODIURIL) 25 MG tablet TAKE 1 TABLET BY MOUTH DAILY 90 tablet 3  . meloxicam (MOBIC) 7.5 MG tablet Take 7.5 mg by mouth daily.    . metoprolol tartrate (LOPRESSOR) 25 MG tablet Take 1 tablet by mouth twice daily 180 tablet 1  . Multiple Vitamins-Minerals (MENS MULTIVITAMIN PLUS PO) Take 1 tablet by mouth daily.    . Potassium 99 MG TABS Take 1 tablet by mouth daily.    . rosuvastatin (CRESTOR) 5 MG tablet Take 1 tablet (5mg ) three times a week and increase as tolerated. 30 tablet 5  . tamsulosin (FLOMAX) 0.4 MG CAPS capsule Take 0.4 mg by mouth daily after supper.    Marland Kitchen UNABLE TO FIND Take 8 tablets by mouth daily. Med Name:Curamin    . XARELTO 20 MG TABS tablet TAKE 1 TABLET BY MOUTH ONCE DAILY WITH SUPPER 30 tablet 5  . diclofenac sodium (VOLTAREN) 1 % GEL      No current facility-administered medications for this visit.        Review of Systems:  Review of Systems  Constitutional: Negative.   HENT: Negative.   Eyes: Negative.   Respiratory: Negative.   Cardiovascular: Negative for chest pain, palpitations, orthopnea, claudication, leg swelling and PND.  Gastrointestinal: Negative.   Genitourinary: Negative.   Musculoskeletal: Positive for joint pain.  Skin: Negative.   Neurological: Negative.   Endo/Heme/Allergies: Negative.   Psychiatric/Behavioral: Negative.        Immunizations: Flu [ refused  ]; Pneumococcal[refused  ];   Physical Exam: BP 132/80   Pulse 88   Temp 97.7 F (36.5 C)   Resp 20   Ht 5\' 10"  (1.778 m)   Wt 214 lb (97.1 kg)   SpO2 92% Comment: RA  BMI 30.71 kg/m   General appearance: alert, appears stated age and no distress Head:  Normocephalic, without obvious abnormality, atraumatic Neck: no adenopathy, no carotid bruit, no JVD, supple, symmetrical, trachea midline and thyroid not enlarged, symmetric, no tenderness/mass/nodules Lymph nodes: Cervical, supraclavicular, and axillary nodes normal. Resp: clear to auscultation bilaterally Back: symmetric, no curvature. ROM normal. No CVA tenderness. Cardio: irregularly irregular rhythm and diastolic murmur: mid diastolic  2/6, decrescendo at 2nd right intercostal space GI: soft, non-tender; bowel sounds normal; no masses,  no organomegaly Extremities: extremities normal, atraumatic, no cyanosis or edema and Homans sign is negative, no sign of DVT Neurologic: Grossly normal  Right knee incision healing  Diagnostic Studies & Laboratory data:     Recent Radiology Findings:  Ct Angio Chest Aorta W &/or Wo Contrast  Result Date: 08/06/2018 CLINICAL DATA:  F/u TAA, hx of PE No complaints Cancer-none Surgery-none EXAM: CT ANGIOGRAPHY CHEST WITH CONTRAST TECHNIQUE: Multidetector CT imaging of the chest was performed using the standard protocol during bolus administration of intravenous contrast. Multiplanar CT image reconstructions and MIPs were obtained to evaluate the vascular anatomy. CONTRAST:  5mL ISOVUE-370 IOPAMIDOL (ISOVUE-370) INJECTION 76% Creatinine was obtained on site at [facility] at [address] Results: Creatinine 1.2 mg/dL. GFR 60. COMPARISON:  CT angio chest 11/27/2017 FINDINGS: Cardiovascular: There is atherosclerotic calcification of the thoracic aorta. Ascending aorta is stable in appearance, measuring 4.8 centimeters just above the aortic valve plane. Previously the aorta measured 4.9 centimeters. Thoracic arch is not aneurysmal. Mediastinum/Nodes: Esophagus is normal in appearance. The visualized portion of the thyroid gland has a normal appearance. No mediastinal, hilar, or axillary adenopathy. Lungs/Pleura: There is minimal bibasilar atelectasis.  Small calcified pleural calcifications are consistent with granulomatous disease. No suspicious pulmonary nodules. No pleural effusions or consolidations. Upper Abdomen: No acute abnormality. Musculoskeletal: No chest wall abnormality. No acute or significant osseous findings. Review of the MIP images confirms the above findings. IMPRESSION: 1. Technically adequate exam showing no evidence for acute abnormality. 2. Stable appearance of ascending aortic aneurysm, measuring 4.8 cm. Recommend semi-annual imaging followup by CTA or MRA and referral to cardiothoracic surgery if not already obtained. This recommendation follows 2010 ACCF/AHA/AATS/ACR/ASA/SCA/SCAI/SIR/STS/SVM Guidelines for the Diagnosis and Management of Patients With Thoracic Aortic Disease. Circulation. 2010; 121: N829-F62. Aortic aneurysm NOS (ICD10-I71.9) 3. Calcified pleural calcifications consistent with granulomatous disease. 4. Aortic Atherosclerosis (ICD10-I70.0). Electronically Signed   By: Nolon Nations M.D.   On: 08/06/2018 15:12    I have independently reviewed the above radiology studies  and reviewed the findings with the patient.   Ct Angio Chest Aorta W/cm &/or Wo/cm  Result Date: 03/21/2016 CLINICAL DATA:  Follow-up aortic root aneurysm EXAM: CT ANGIOGRAPHY CHEST WITH CONTRAST TECHNIQUE: Multidetector CT imaging of the chest was performed using the standard protocol during bolus administration of intravenous contrast. Multiplanar CT image reconstructions and MIPs were obtained to evaluate the vascular anatomy. CONTRAST:  75 mL Isovue 370. Creatinine was obtained on site at [facility] at [address]Results: Creatinine 1.1 mg/dL. COMPARISON:  02/09/2015 FINDINGS: Cardiovascular: There is prominence of the ascending aorta again identified. At the level of the sinus of Valsalva, the aorta measures approximately 4.5 cm. The ascending aorta measures approximately 4.9 cm in greatest dimension stable in appearance from  the prior exam. Despite being a gated study there is some persistent motion artifact identified. Mild coronary calcifications are again seen. The aortic arch tapers in a normal fashion. The brachiocephalic vessels are within normal limits with mild atherosclerotic calcifications. The descending thoracic aorta is within normal limits. The pulmonary artery is incompletely evaluated although no large central pulmonary embolus is noted. Mediastinum/Nodes: The thoracic inlet is within normal limits. No significant hilar or mediastinal adenopathy is noted. A few scattered small lymph nodes are noted stable from the prior exam. Lungs/Pleura: Lungs are well aerated bilaterally without focal infiltrate or sizable parenchymal nodule. No effusion is seen.  Upper Abdomen: Visualized upper abdomen is within normal limits. Musculoskeletal: No acute bony abnormality is noted. Review of the MIP images confirms the above findings. IMPRESSION: Stable appearance of the ascending aorta dilated to 4.9 cm in maximum dimension. Ascending thoracic aortic aneurysm. Recommend semi-annual imaging followup by CTA or MRA. This recommendation follows 2010 ACCF/AHA/AATS/ACR/ASA/SCA/SCAI/SIR/STS/SVM Guidelines for the Diagnosis and Management of Patients With Thoracic Aortic Disease. Circulation. 2010; 121: M196-Q229 No other focal abnormality is seen. Electronically Signed   By: Inez Catalina M.D.   On: 03/21/2016 14:35   Ct Angio Chest Aorta W/cm &/or Wo/cm  02/09/2015  CLINICAL DATA:  Aneurysmal disease of the ascending thoracic aorta. EXAM: CT ANGIOGRAPHY CHEST WITH CONTRAST TECHNIQUE: Multidetector CT imaging of the chest was performed using the standard protocol during bolus administration of intravenous contrast. Multiplanar CT image reconstructions and MIPs were obtained to evaluate the vascular anatomy. CONTRAST:  100 mL Isovue 370 IV COMPARISON:  01/06/2014, 05/06/2013 and 05/11/2012 FINDINGS: Stable aneurysmal disease of the ascending  thoracic aorta measures 4.9 cm in greatest diameter. This is stable dating back to 2014. The aorta measures 4.4 cm at the sinuses of Valsalva. The proximal arch measures 3.7 cm. The distal arch measures 3.1 cm. The descending thoracic aorta measures 2.8 cm. No evidence of aortic dissection. Proximal great vessels show stable and normal patency. The innominate artery is tortuous. Stable cardiac enlargement. No pleural or pericardial fluid identified. Lungs show no evidence of edema, infiltrate or nodule. No enlarged lymph nodes are seen. Bony structures are unremarkable. Review of the MIP images confirms the above findings. IMPRESSION: Stable aneurysmal disease of the ascending thoracic aorta measuring 4.9 cm in greatest diameter. Electronically Signed   By: Aletta Edouard M.D.   On: 02/09/2015 10:32      Ct Angio Chest Aorta W/cm &/or Wo/cm  01/06/2014   CLINICAL DATA:  Followup ascending thoracic aortic aneurysm.  EXAM: CT ANGIOGRAPHY CHEST WITH CONTRAST  TECHNIQUE: Multidetector CT imaging of the chest was performed using the standard protocol during bolus administration of intravenous contrast. Multiplanar CT image reconstructions and MIPs were obtained to evaluate the vascular anatomy.  CONTRAST:  59mL OMNIPAQUE IOHEXOL 350 MG/ML SOLN  COMPARISON:  05/06/2013.  FINDINGS: The maximum aortic diameter at the sinus of Valsalva, sino-tubular junction and ascending thoracic aorta are 4.0 cm, 4.6 cm and 4.4 cm respectively. These were previously 4.5 cm, 4.9 cm and 4.6 cm respectively, measured at the same levels.  The degree of dilatation of the left atrium is unchanged. Minimal residual atelectasis or scarring at both lung bases. No lung nodules or enlarged lymph nodes. Stable mild narrowing of the origin of the celiac axis. Mild thoracic spine degenerative changes.  Review of the MIP images confirms the above findings.  IMPRESSION: Interval mild decrease in size of the previously demonstrated dilated aortic  root, sino-tubular junction and ascending thoracic aorta. The maximum diameter today is 4.6 cm at the sino-tubular junction.   Electronically Signed   By: Enrique Sack M.D.   On: 01/06/2014 11:56     Ct Angio Chest Aorta W/cm &/or Wo/cm  05/06/2013   CLINICAL DATA:  Aortic root evaluation  EXAM: CT ANGIOGRAPHY CHEST WITH CONTRAST  TECHNIQUE: Multidetector CT imaging of the chest was performed using the standard protocol during bolus administration of intravenous contrast. Multiplanar CT image reconstructions and MIPs were obtained to evaluate the vascular anatomy.  CONTRAST:  65mL OMNIPAQUE IOHEXOL 350 MG/ML SOLN  COMPARISON:  None.  FINDINGS: Maximal aortic diameter is  at the sinus of Valsalva, sino-tubular junction, and ascending aorta are 4.5 cm, 4.9 cm, and 4.6 cm respectively. Mild coronary artery calcifications in the circumflex and LAD coronary arteries. Minimal right coronary artery calcifications.  Great vessels are patent. Vertebral arteries are patent within the confines of the exam.  There is mild dilatation of the left atrium. Very minimal peripheral aortic valve calcifications.  Limited images of the abdomen demonstrate mild narrowing at the origin of the celiac axis and patency at the origin of the SMA.  No abnormal mediastinal adenopathy.  No mediastinal mass effect.  Bibasilar atelectasis.  No pneumothorax or pleural effusion. Chronic right upper rib deformities. No definite acute bony deformity. Posterior disc osteophytes at C6-7 with severe disc space narrowing.  Review of the MIP images confirms the above findings.  IMPRESSION: There is significant dilatation of the aortic root as described. The sino-tubular junction is 4.9 cm in diameter. No evidence of dissection. Minimal peripheral aortic valvular calcifications.   Electronically Signed   By: Maryclare Bean M.D.   On: 05/06/2013 16:21   05/11/2012 CT of Chest: CT ANGIOGRAPHY CHEST Comparison: CT scan of June 20, 2008. Findings: No  significant abnormality seen involving the pulmonary parenchyma. No pleural effusions or pneumothorax are noted. Normal contrast filling of pulmonary arteries is noted. There is no evidence of aortic dissection. Coronary artery calcifications are noted consistent with coronary artery disease. There is mild dilatation of the ascending thoracic aorta is again noted which now measures 5.0 cm which is slightly increased compared to prior exam of 2010. No mass or adenopathy is noted in the mediastinum. Visualized portions of upper abdomen are within normal limits.  IMPRESSION: No evidence of thoracic aortic dissection is noted. Coronary artery calcifications are noted suggesting coronary artery disease. Aneurysmal dilatation of the ascending thoracic aorta is noted with maximum measured diameter of 5.0 cm which has increased compared to prior exam of 2010. Original Report Authenticated By: Marijo Conception., M.D.    02/2006 CT of Chest Satisfactory contrast-opacification of pulmonary vasculature. Right upper lobe evidence for pulmonary thromboembolism. Ascending aorta measures 4.4 cm transverse by 4.7 cm AP. Descending aorta measures 3.2 cm transverse by 3.1 cm AP. No acute pulmonary infiltrate. No pleural effusion. No adenopathy. Normal cardiac size. 11/02/2007 CT of Chest: Findings: The chest wall is stable. No supraclavicular or axillary adenopathy. The thyroid gland appears normal.  The heart is borderline enlarged but stable. No pericardial effusion. Stable small scattered mediastinal and hilar lymph nodes.  No change and fusiform aneurysmal dilatation of the ascending aorta with maximal measurements of 4.7 x 4.7 cm. No dissection. The major branch vessels are patent. The esophagus is grossly normal. There is a filling defect and a right lower lobe pulmonary artery consistent with pulmonary embolism. There is tortuosity of the descending thoracic aorta.  Examination of the  lung parenchyma demonstrates minimal lingular scarring change. The airspace consolidation has resolved and this may be post pneumonic scarring. No infiltrates, edema or effusions. No interstitial lung disease. No pulmonary masses or nodules.  The upper abdomen is stable. There is a small left adrenal gland nodule which is likely a benign adenoma.  IMPRESSION:  1. Incidental pulmonary embolism is noted in a right lower lobe pulmonary artery. 2. Near complete resolution of left lingular process. There is minimal residual probable post pneumonic scarring changes. No acute pulmonary findings or pulmonary masses. 3. Stable ascending thoracic aortic aneurysm. 4. Borderline cardiac size, stable. 5. Stable small mediastinal and hilar lymph  nodes. 6. Stable left adrenal gland nodule, likely benign adenoma. 7. Stable periportal and celiac axis lymph nodes.  06/20/2008 CT of Chest: Comparison: CT chest without and with contrast 11/02/2007. Findings: Measured in the axial plane at a level approximately 2.5 cm above the aortic valve on series 4, image 30, maximum diameter of the ascending thoracic aorta remains unchanged at 4.7 cm. Tortuous aortic arch and proximal great vessels without significant atherosclerosis. Mild descending thoracic and upper abdominal aortic atherosclerosis. No evidence of aortic dissection. Heart mildly enlarged but stable. No visible coronary artery calcification. No pericardial effusion. Interval resolution of the segmental right lower lobe of pulmonary embolus; no central pulmonary emboli currently.  Stable scattered normal-sized mediastinal and hilar lymph nodes; no new, enlarging, or suspicious lymphadenopathy. Normal thyroid gland. Minimal scarring in the lingula and in the lower lobes. Lungs otherwise clear without localized airspace consolidation, interstitial disease, or nodularity. High resolution expiratory images demonstrate scattered areas of  hyperlucency consistent with localized air trapping. Stable approximate 1.1 cm left adrenal adenoma. Normal-sized porta hepatis and celiac axis lymph nodes again noted and unchanged. Remaining visualized upper abdomen unremarkable for the early arterial phase of enhancement. Bone window images again demonstrate mild thoracic spondylosis.    ECHO: 02/2017  ------------------------------------------------------------------- Echocardiography  Patient:    Allante, [place] MR #:       [id number] Study Date: 02/13/2017 Gender:     M Age:        62 Height:     177.8 cm Weight:     99.3 kg BSA:        2.24 m^2 Pt. Status: Room:   SONOGRAPHER  Marygrace Drought, RCS  ORDERING     [name], M.D.  REFERRING    [name], M.D.  PERFORMING   [facility], Outpatient  ATTENDING    Skeet Latch, MD  cc:  ------------------------------------------------------------------- LV EF: 50% -   55%  ------------------------------------------------------------------- Indications:      Aortic Valve Disorder (I35.1).  ------------------------------------------------------------------- History:   PMH:  Hyperlipidemia, AAA, sleep apnea.  Coronary artery disease.  Risk factors:  Hypertension.  ------------------------------------------------------------------- Study Conclusions  - Left ventricle: The cavity size was mildly dilated. Wall   thickness was increased in a pattern of moderate LVH. Systolic   function was normal. The estimated ejection fraction was in the   range of 50% to 55%. Wall motion was normal; there were no   regional wall motion abnormalities. Doppler parameters are   consistent with indeterminate ventricular filling pressure. - Aortic valve: Transvalvular velocity was within the normal range.   There was no stenosis. There was moderate regurgitation.   Regurgitation pressure half-time: 425 ms. - Ascending aorta: The ascending aorta was moderately dilated. -  Mitral valve: There was mild regurgitation. - Left atrium: The atrium was severely dilated. - Right ventricle: The cavity size was normal. Wall thickness was   normal. Systolic function was normal. - Atrial septum: No defect or patent foramen ovale was identified. - Tricuspid valve: There was mild regurgitation. - Pulmonary arteries: Systolic pressure was within the normal   range. PA peak pressure: 27 mm Hg (S).  ------------------------------------------------------------------- Study data:  Comparison was made to the study of 11/16/2015.  Study status:  Routine.  Procedure:  The patient reported no pain pre or post test. Transthoracic echocardiography. Image quality was adequate.          Echocardiography.  M-mode, complete 2D, spectral Doppler, and color Doppler.  Birthdate:  Patient birthdate: [birth year].  Age:  Patient is 73 yr old.  Sex:  Gender: male. BMI: 31.4 kg/m^2.  Blood pressure:     132/82  Patient status: Outpatient.  Study date:  Study date: 02/13/2017. Study time: 11:40 AM.  Location:  [place] [facility]  -------------------------------------------------------------------  ------------------------------------------------------------------- Left ventricle:  The cavity size was mildly dilated. Wall thickness was increased in a pattern of moderate LVH. Systolic function was normal. The estimated ejection fraction was in the range of 50% to 55%. Wall motion was normal; there were no regional wall motion abnormalities. The study was not technically sufficient to allow evaluation of LV diastolic dysfunction due to atrial fibrillation. Doppler parameters are consistent with indeterminate ventricular filling pressure.  ------------------------------------------------------------------- Aortic valve:   Trileaflet; normal thickness leaflets. Mobility was not restricted.  Doppler:  Transvalvular velocity was within the normal range. There was no stenosis. There was  moderate regurgitation.  ------------------------------------------------------------------- Aorta:  Aortic root: The aortic root was normal in size. Ascending aorta: The ascending aorta was moderately dilated.  ------------------------------------------------------------------- Mitral valve:   Structurally normal valve.   Mobility was not restricted.  Doppler:  Transvalvular velocity was within the normal range. There was no evidence for stenosis. There was mild regurgitation.    Peak gradient (D): 4 mm Hg.  ------------------------------------------------------------------- Left atrium:  The atrium was severely dilated.  ------------------------------------------------------------------- Atrial septum:  No defect or patent foramen ovale was identified.   ------------------------------------------------------------------- Right ventricle:  The cavity size was normal. Wall thickness was normal. Systolic function was normal.  ------------------------------------------------------------------- Pulmonic valve:    Doppler:  Transvalvular velocity was within the normal range. There was no evidence for stenosis.  ------------------------------------------------------------------- Tricuspid valve:   Structurally normal valve.    Doppler: Transvalvular velocity was within the normal range. There was mild regurgitation.  ------------------------------------------------------------------- Pulmonary artery:   The main pulmonary artery was normal-sized. Systolic pressure was within the normal range.  ------------------------------------------------------------------- Right atrium:  The atrium was normal in size.  ------------------------------------------------------------------- Pericardium:  There was no pericardial effusion.  ------------------------------------------------------------------- Systemic veins: Inferior vena cava: The vessel was normal in size. The  respirophasic diameter changes were in the normal range (= 50%), consistent with normal central venous pressure. Diameter: 20 mm.  ------------------------------------------------------------------- Measurements   IVC                                        Value        Reference  ID                                         20    mm     ----------    Left ventricle                             Value        Reference  LV ID, ED, PLAX chordal            (H)     57.2  mm     43 - 52  LV ID, ES, PLAX chordal            (H)     45    mm     23 - 38  LV fx shortening, PLAX chordal     (  L)     21    %      >=29  LV PW thickness, ED                        14.1  mm     ----------  IVS/LV PW ratio, ED                        0.89         <=1.3  Stroke volume, 2D                          100   ml     ----------  Stroke volume/bsa, 2D                      45    ml/m^2 ----------  LV e&', lateral                             13.5  cm/s   ----------  LV E/e&', lateral                           7.63         ----------  LV e&', medial                              8.49  cm/s   ----------  LV E/e&', medial                            12.13        ----------  LV e&', average                             11    cm/s   ----------  LV E/e&', average                           9.37         ----------    Ventricular septum                         Value        Reference  IVS thickness, ED                          12.5  mm     ----------    LVOT                                       Value        Reference  LVOT ID, S                                 24    mm     ----------  LVOT area  4.52  cm^2   ----------  LVOT peak velocity, S                      105   cm/s   ----------  LVOT mean velocity, S                      73.4  cm/s   ----------  LVOT VTI, S                                22.1  cm     ----------    Aortic valve                               Value        Reference   Aortic regurg pressure half-time           425   ms     ----------    Aorta                                      Value        Reference  Aortic root ID, ED                         43    mm     ----------  Ascending aorta ID, A-P, S                 4.6   mm     ----------    Left atrium                                Value        Reference  LA ID, A-P, ES                             40    mm     ----------  LA ID/bsa, A-P                             1.78  cm/m^2 <=2.2  LA volume, S                               156   ml     ----------  LA volume/bsa, S                           69.5  ml/m^2 ----------  LA volume, ES, 1-p A4C                     145   ml     ----------  LA volume/bsa, ES, 1-p A4C                 64.6  ml/m^2 ----------  LA volume, ES, 1-p A2C                     158   ml     ----------  LA volume/bsa, ES, 1-p A2C                 70.4  ml/m^2 ----------    Mitral valve                               Value        Reference  Mitral E-wave peak velocity                103   cm/s   ----------  Mitral deceleration time                   162   ms     150 - 230  Mitral peak gradient, D                    4     mm Hg  ----------  Mitral regurg VTI, PISA                    158   cm     ----------  Mitral ERO, PISA                           0.14  cm^2   ----------  Mitral regurg volume, PISA                 22    ml     ----------    Pulmonary arteries                         Value        Reference  PA pressure, S, DP                         27    mm Hg  <=30    Tricuspid valve                            Value        Reference  Tricuspid regurg peak velocity             246   cm/s   ----------  Tricuspid peak RV-RA gradient              24    mm Hg  ----------  Tricuspid maximal regurg velocity,         246   cm/s   ----------  PISA    Right atrium                               Value        Reference  RA ID, S-I, ES, A4C                (H)     68.1  mm     34 - 49  RA area, ES,  A4C                   (H)     20.7  cm^2   8.3 - 19.5  RA volume, ES, A/L                         53.1  ml     ----------  RA volume/bsa, ES, A/L                     23.7  ml/m^2 ----------    Systemic veins                             Value        Reference  Estimated CVP                              3     mm Hg  ----------    Right ventricle                            Value        Reference  TAPSE                                      20.8  mm     ----------  RV pressure, S, DP                         27    mm Hg  <=30  RV s&', lateral, S                          8.7   cm/s   ----------  Legend: (L)  and  (H)  mark values outside specified reference range.  ------------------------------------------------------------------- Prepared and Electronically Authenticated by  Skeet Latch, MD 2019-02-07T15:37:44  PFT's: FEV1 2.08  61% +10% post treatment  DLCO 115 %   Study Highlights     Nuclear stress EF: 49%.  There was no ST segment deviation noted during stress.  This is a low risk study.  The left ventricular ejection fraction is mildly decreased (45-54%).   Low risk stress nuclear study with normal perfusion mildly dilated left ventricle with mildly reduced global systolic function. Findings suggest nonischemic cardiomyopathy.    Most recent echo 02/2017  Assessment / Plan:     1)  Dilated ascending aorta, chronic 4.7 cm in 2008 now 4.9 cm today  ,                    Aortic Size Index=    4.9     /Body surface area is 2.27 meters squared. = 2.15                                                      < 2.75 cm/m2      4% risk per year                                                       2.75 to 4.25          8% risk per year                                                      >  4.25 cm/m2    20% risk per year  #2 valvular heart disease by echo report: Trileaflet aortic valve-last echo 2019 moderate aortic insufficiency    #3 chronic atrial fibrillation:  now on anticoagulation #4 extensive coronary artery calcification on CT scan, positive family history of early coronary artery disease, no previous history of ischemic events, no current symptoms-negative nuclear stress test November 2019 #5 history of pulmonary emboli x2, no history of workup for hypercoagulable state-on Xarelto #6 obstructive sleep apnea/Moderaltly Severe Airflow limitation #7 hyperlipidemia  #8 benign prostatic hypertrophy #9 history of hypertension, he is currently on a beta blocker    I have reviewed with him the most recent CT scan and recommended that we repeat surveillance of the size of his aorta with a CT of the chest in 12 months  Will obtain echocardiogram soon, patient would like to wait 2 to 3 weeks.     Grace Isaac MD      [address] [place] [address] Office [phone]   Beeper [phone]  08/06/2018 5:14 PM

## 2018-09-04 ENCOUNTER — Other Ambulatory Visit: Payer: Self-pay

## 2018-09-04 ENCOUNTER — Ambulatory Visit (HOSPITAL_COMMUNITY): Payer: PPO | Attending: Cardiology

## 2018-09-04 DIAGNOSIS — I351 Nonrheumatic aortic (valve) insufficiency: Secondary | ICD-10-CM | POA: Insufficient documentation

## 2018-09-04 DIAGNOSIS — I712 Thoracic aortic aneurysm, without rupture: Secondary | ICD-10-CM | POA: Insufficient documentation

## 2018-09-04 DIAGNOSIS — I7121 Aneurysm of the ascending aorta, without rupture: Secondary | ICD-10-CM

## 2018-10-21 ENCOUNTER — Other Ambulatory Visit: Payer: Self-pay | Admitting: Cardiology

## 2018-10-22 DIAGNOSIS — E7800 Pure hypercholesterolemia, unspecified: Secondary | ICD-10-CM | POA: Diagnosis not present

## 2018-10-22 DIAGNOSIS — I4891 Unspecified atrial fibrillation: Secondary | ICD-10-CM | POA: Diagnosis not present

## 2018-10-22 DIAGNOSIS — Z1389 Encounter for screening for other disorder: Secondary | ICD-10-CM | POA: Diagnosis not present

## 2018-10-22 DIAGNOSIS — R3911 Hesitancy of micturition: Secondary | ICD-10-CM | POA: Diagnosis not present

## 2018-10-22 DIAGNOSIS — I10 Essential (primary) hypertension: Secondary | ICD-10-CM | POA: Diagnosis not present

## 2018-10-22 DIAGNOSIS — Z23 Encounter for immunization: Secondary | ICD-10-CM | POA: Diagnosis not present

## 2018-10-22 DIAGNOSIS — Z0000 Encounter for general adult medical examination without abnormal findings: Secondary | ICD-10-CM | POA: Diagnosis not present

## 2018-10-22 DIAGNOSIS — N400 Enlarged prostate without lower urinary tract symptoms: Secondary | ICD-10-CM | POA: Diagnosis not present

## 2018-11-13 ENCOUNTER — Encounter: Payer: Self-pay | Admitting: Cardiology

## 2018-11-13 ENCOUNTER — Ambulatory Visit (INDEPENDENT_AMBULATORY_CARE_PROVIDER_SITE_OTHER): Payer: PPO | Admitting: Cardiology

## 2018-11-13 ENCOUNTER — Other Ambulatory Visit: Payer: Self-pay

## 2018-11-13 VITALS — BP 122/62 | HR 85 | Ht 69.3 in | Wt 214.0 lb

## 2018-11-13 DIAGNOSIS — I10 Essential (primary) hypertension: Secondary | ICD-10-CM | POA: Diagnosis not present

## 2018-11-13 DIAGNOSIS — I712 Thoracic aortic aneurysm, without rupture: Secondary | ICD-10-CM | POA: Diagnosis not present

## 2018-11-13 DIAGNOSIS — E782 Mixed hyperlipidemia: Secondary | ICD-10-CM

## 2018-11-13 DIAGNOSIS — I2510 Atherosclerotic heart disease of native coronary artery without angina pectoris: Secondary | ICD-10-CM | POA: Diagnosis not present

## 2018-11-13 DIAGNOSIS — I7121 Aneurysm of the ascending aorta, without rupture: Secondary | ICD-10-CM

## 2018-11-13 LAB — COMPREHENSIVE METABOLIC PANEL WITH GFR
ALT: 11 IU/L (ref 0–44)
AST: 20 IU/L (ref 0–40)
Albumin/Globulin Ratio: 1.5 (ref 1.2–2.2)
Albumin: 3.9 g/dL (ref 3.7–4.7)
Alkaline Phosphatase: 89 IU/L (ref 39–117)
BUN/Creatinine Ratio: 21 (ref 10–24)
BUN: 22 mg/dL (ref 8–27)
Bilirubin Total: 0.6 mg/dL (ref 0.0–1.2)
CO2: 25 mmol/L (ref 20–29)
Calcium: 9.6 mg/dL (ref 8.6–10.2)
Chloride: 102 mmol/L (ref 96–106)
Creatinine, Ser: 1.07 mg/dL (ref 0.76–1.27)
GFR calc Af Amer: 80 mL/min/1.73
GFR calc non Af Amer: 69 mL/min/1.73
Globulin, Total: 2.6 g/dL (ref 1.5–4.5)
Glucose: 112 mg/dL — ABNORMAL HIGH (ref 65–99)
Potassium: 3.7 mmol/L (ref 3.5–5.2)
Sodium: 140 mmol/L (ref 134–144)
Total Protein: 6.5 g/dL (ref 6.0–8.5)

## 2018-11-13 LAB — LIPID PANEL
Chol/HDL Ratio: 3.9 ratio (ref 0.0–5.0)
Cholesterol, Total: 132 mg/dL (ref 100–199)
HDL: 34 mg/dL — ABNORMAL LOW (ref >39)
LDL Chol Calc (NIH): 74 mg/dL (ref 0–99)
Triglycerides: 132 mg/dL (ref 0–149)
VLDL Cholesterol Cal: 24 mg/dL (ref 5–40)

## 2018-11-13 LAB — CBC WITH DIFFERENTIAL/PLATELET
Basophils Absolute: 0.1 10*3/uL (ref 0.0–0.2)
Basos: 1 %
EOS (ABSOLUTE): 0.6 10*3/uL — ABNORMAL HIGH (ref 0.0–0.4)
Eos: 8 %
Hematocrit: 41.6 % (ref 37.5–51.0)
Hemoglobin: 14.2 g/dL (ref 13.0–17.7)
Immature Grans (Abs): 0 10*3/uL (ref 0.0–0.1)
Immature Granulocytes: 0 %
Lymphocytes Absolute: 1.8 10*3/uL (ref 0.7–3.1)
Lymphs: 21 %
MCH: 30.7 pg (ref 26.6–33.0)
MCHC: 34.1 g/dL (ref 31.5–35.7)
MCV: 90 fL (ref 79–97)
Monocytes Absolute: 0.9 10*3/uL (ref 0.1–0.9)
Monocytes: 11 %
Neutrophils Absolute: 4.9 10*3/uL (ref 1.4–7.0)
Neutrophils: 59 %
Platelets: 228 10*3/uL (ref 150–450)
RBC: 4.62 x10E6/uL (ref 4.14–5.80)
RDW: 13 % (ref 11.6–15.4)
WBC: 8.3 10*3/uL (ref 3.4–10.8)

## 2018-11-13 LAB — TSH: TSH: 1.42 u[IU]/mL (ref 0.450–4.500)

## 2018-11-13 NOTE — Progress Notes (Signed)
Cardiology Office Note    Date:  11/13/2018   ID:  [name], DOB Jun 12, 1945, MRN [id number]  PCP:  [name], MD  Cardiologist: [name], MD EPS: None  Reason for visit: 1 year follow-up  History of Present Illness:  Aaron Richmond is a 73 y.o. male with history of persistent atrial fibrillation CHA2DS2-VASc score equals 3 on Xarelto, dilated asending aortic aneurysm 4.9 cm on CTA 03/2017 followed yearly by Dr. [name] who does not want operate unless diameter reaches 55 mm or there is another indication.  2D echo 02/2017 LVEF 50 to 55% with moderate aortic regurgitation.  Also has extensive coronary calcification on CT scan and positive family history of early CAD per patient has had no angina.  Also has hypertension, HLD. History of Pulmonary embolus after a long plane ride about 2009.  11/13/2018 -patient is coming after year, he has been doing great, he has retired and is working the house, does not exercise.  He denies any chest pain, shortness of breath no lower extremity edema orthopnea proximal nocturnal dyspnea.  He underwent a knee replacement a year ago with some swelling after surgery but that has resolved.  He is experiencing hip pain for which he might need surgery future.  He denies any palpitations dizziness syncope or falls.  Past Medical History:  Diagnosis Date  . Aortic regurgitation   . 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 Atrial fibrillation ([place])   . BPH (benign prostatic hyperplasia)   . Hyperlipidemia   . Hypertension   . Obesity   . Pulmonary embolism ([place])   . Rhinitis   . Sleep apnea    CPAP    Past Surgical History:  Procedure Laterality Date  . APPENDECTOMY    . HERNIA REPAIR    . TONSILLECTOMY     Current Medications: Current Meds  Medication Sig  . acetaminophen (TYLENOL) 500 MG tablet Take 2,000 mg by mouth daily as needed.   Marland Kitchen b complex vitamins tablet Take 1 tablet by mouth daily.  . Chelated Zinc 50 MG TABS Take 1 tablet  by mouth daily.  . finasteride (PROSCAR) 5 MG tablet Take 5 mg by mouth daily.  . fish oil-omega-3 fatty acids 1000 MG capsule Take 1,200 g by mouth daily.  . folic acid (FOLVITE) [id number] MCG tablet Take 800 mcg by mouth daily.  . hydrochlorothiazide (HYDRODIURIL) 25 MG tablet Take 1 tablet by mouth once daily  . meloxicam (MOBIC) 7.5 MG tablet Take 7.5 mg by mouth daily.  . metoprolol tartrate (LOPRESSOR) 25 MG tablet Take 1 tablet by mouth twice daily  . Multiple Vitamins-Minerals (MENS MULTIVITAMIN PLUS PO) Take 1 tablet by mouth daily.  . Potassium 99 MG TABS Take 1 tablet by mouth daily.  . rosuvastatin (CRESTOR) 5 MG tablet Take 1 tablet (5mg ) three times a week and increase as tolerated.  . tamsulosin (FLOMAX) 0.4 MG CAPS capsule Take 0.4 mg by mouth daily after supper.  Marland Kitchen UNABLE TO FIND Take 2 tablets by mouth daily. Med Name:Curamin  . XARELTO 20 MG TABS tablet TAKE 1 TABLET BY MOUTH ONCE DAILY WITH SUPPER     Allergies:   Statins   Social History   Socioeconomic History  . Marital status: Married    Spouse name: Not on file  . Number of children: 0  . Years of education: Not on file  . Highest education level: Not on file  Occupational History  . Occupation: Engineering geologist  Social Needs  . Financial resource strain: Not on file  . Food insecurity    Worry: Not on file    Inability: Not on file  . Transportation needs    Medical: Not on file    Non-medical: Not on file  Tobacco Use  . Smoking status: Never Smoker  . Smokeless tobacco: Never Used  Substance and Sexual Activity  . Alcohol use: No  . Drug use: No  . Sexual activity: Not on file  Lifestyle  . Physical activity    Days per week: Not on file    Minutes per session: Not on file  . Stress: Not on file  Relationships  . Social Herbalist on phone: Not on file    Gets together: Not on file    Attends religious service: Not on file    Active member of club or organization: Not on file     Attends meetings of clubs or organizations: Not on file    Relationship status: Not on file  Other Topics Concern  . Not on file  Social History Narrative  . Not on file    Family History:  The patient's family history includes Alzheimer's disease in his mother; Diabetes Mellitus II in his father; Heart attack in his mother; Stroke in his father and mother.   ROS:   Please see the history of present illness.    Review of Systems  Constitution: Negative.  HENT: Negative.   Cardiovascular: Negative.   Respiratory: Negative.   Endocrine: Negative.   Hematologic/Lymphatic: Negative.   Musculoskeletal: Positive for arthritis and joint pain.  Gastrointestinal: Negative.   Genitourinary: Negative.   Neurological: Negative.    All other systems reviewed and are negative.  PHYSICAL EXAM:   VS:  BP 122/62   Pulse 85   Ht 5' 9.3" (1.76 m)   Wt 214 lb (97.1 kg)   SpO2 94%   BMI 31.33 kg/m   Physical Exam  GEN: Well nourished, well developed, in no acute distress  Neck: no JVD, carotid bruits, or masses Cardiac:irreg irreg; no murmurs, rubs, or gallops  Respiratory:  clear to auscultation bilaterally, normal work of breathing GI: soft, nontender, nondistended, + BS Ext: without cyanosis, clubbing, or edema, Good distal pulses bilaterally Neuro:  Alert and Oriented x 3 Psych: euthymic mood, full affect  Wt Readings from Last 3 Encounters:  11/13/18 214 lb (97.1 kg)  08/06/18 214 lb (97.1 kg)  11/27/17 223 lb (101.2 kg)      Studies/Labs Reviewed:   EKG:  EKG is not ordered today.  The ekg reviewed from 05/2017 Atrial fib  Recent Labs: 12/22/2017: ALT 13   Lipid Panel    Component Value Date/Time   CHOL 144 12/22/2017 0822   TRIG 121 12/22/2017 0822   HDL 36 (L) 12/22/2017 0822   CHOLHDL 4.0 12/22/2017 0822   LDLCALC 84 12/22/2017 0822    Additional studies/ records that were reviewed today include:   TTE 09/04/2018    1. The left ventricle has low normal systolic  function, with an ejection fraction of 50-55%. The cavity size was severely dilated. Left ventricular diastolic function could not be evaluated secondary to atrial fibrillation. No evidence of left  ventricular regional wall motion abnormalities.  2. The right ventricle has normal systolic function. The cavity was mildly enlarged. There is no increase in right ventricular wall thickness. Right ventricular systolic pressure is normal.  3. Left atrial size was severely dilated.  4.  There is moderate dilatation of the sinus of Valsalva at 72mm, 81mm at the sinotubular junction and 73mm in the ascending aorta.  5. Mitral valve regurgitation is mild to moderate by color flow    Chest CTA 08/06/2018  1. Technically adequate exam showing no evidence for acute abnormality. 2. Stable appearance of ascending aortic aneurysm, measuring 4.8 cm. Recommend semi-annual imaging followup by CTA or MRA and referral to cardiothoracic surgery if not already obtained. This recommendation follows 2010 ACCF/AHA/AATS/ACR/ASA/SCA/SCAI/SIR/STS/SVM Guidelines for the Diagnosis and Management of Patients With Thoracic Aortic Disease. Circulation. 2010; [id number]. Aortic aneurysm NOS (ICD10-I71.9) 3. Calcified pleural calcifications consistent with granulomatous disease. 4. Aortic Atherosclerosis (ICD10-I70.0).   ASSESSMENT:    1. Essential hypertension   2. Coronary artery calcification seen on CT scan   3. Ascending aortic aneurysm ([place])   4. Mixed hyperlipidemia      PLAN:  In order of problems listed above:  Ascending aortic aneurysm -stable at 48 mm on CTA in July 2020, followed by Dr. [name]  PAF chads vas score equals 3 on Xarelto.  No bleeding, will recheck CBC today.  Aortic valve insufficiency moderate on 2D echo in August 2020, however LV dilatation, he might be coming to the point of surgery soon.  Essential hypertension -controlled on current regimen  History of pulmonary embolism  after a long flight in 2009.  On Xarelto.  Coronary artery calcification seen on CT scan and strong family history of early CAD but is asymptomatic without chest pain  Hyperlipidemia with statin allergy.  Now on 5 mg of rosuvastatin 3 times a week, and fish oil, he eats unrestricted diet, advised on heart healthy diet Mediterranean style and increase his exercise level.  We will check lipids today.  Medication Adjustments/Labs and Tests Ordered: Current medicines are reviewed at length with the patient today.  Concerns regarding medicines are outlined above.  Medication changes, Labs and Tests ordered today are listed in the Patient Instructions below. Patient Instructions  Medication Instructions:   Your physician recommends that you continue on your current medications as directed. Please refer to the Current Medication list given to you today.  *If you need a refill on your cardiac medications before your next appointment, please call your pharmacy*   Lab Work:  TODAY-CMET, CBC W DIFF, TSH, AND LIPIDS  If you have labs (blood work) drawn today and your tests are completely normal, you will receive your results only by: Marland Kitchen MyChart Message (if you have MyChart) OR . A paper copy in the mail If you have any lab test that is abnormal or we need to change your treatment, we will call you to review the results.    Follow-Up: At [facility], you and your health needs are our priority.  As part of our continuing mission to provide you with exceptional heart care, we have created designated Provider Care Teams.  These Care Teams include your primary Cardiologist (physician) and Advanced Practice Providers (APPs -  Physician Assistants and Nurse Practitioners) who all work together to provide you with the care you need, when you need it.  Your next appointment:   6 months  The format for your next appointment:   Either In Person or Virtual  Provider:   [name], MD       Signed, [name], MD  11/13/2018 9:[address], [place], [place]  [address] Phone: [phone]; Fax: [phone]

## 2018-11-13 NOTE — Patient Instructions (Signed)
Medication Instructions:   Your physician recommends that you continue on your current medications as directed. Please refer to the Current Medication list given to you today.  *If you need a refill on your cardiac medications before your next appointment, please call your pharmacy*   Lab Work:  TODAY-CMET, CBC W DIFF, TSH, AND LIPIDS  If you have labs (blood work) drawn today and your tests are completely normal, you will receive your results only by: Marland Kitchen MyChart Message (if you have MyChart) OR . A paper copy in the mail If you have any lab test that is abnormal or we need to change your treatment, we will call you to review the results.    Follow-Up: At [facility], you and your health needs are our priority.  As part of our continuing mission to provide you with exceptional heart care, we have created designated Provider Care Teams.  These Care Teams include your primary Cardiologist (physician) and Advanced Practice Providers (APPs -  Physician Assistants and Nurse Practitioners) who all work together to provide you with the care you need, when you need it.  Your next appointment:   6 months  The format for your next appointment:   Either In Person or Virtual  Provider:   [name], MD

## 2018-11-20 ENCOUNTER — Other Ambulatory Visit: Payer: Self-pay | Admitting: Cardiology

## 2018-12-02 ENCOUNTER — Other Ambulatory Visit: Payer: Self-pay

## 2018-12-21 ENCOUNTER — Other Ambulatory Visit: Payer: Self-pay | Admitting: Cardiology

## 2018-12-22 DIAGNOSIS — M1711 Unilateral primary osteoarthritis, right knee: Secondary | ICD-10-CM | POA: Diagnosis not present

## 2018-12-22 DIAGNOSIS — Z96651 Presence of right artificial knee joint: Secondary | ICD-10-CM | POA: Diagnosis not present

## 2018-12-22 NOTE — Telephone Encounter (Addendum)
Prescription refill request for Xarelto received.   Last office visit: Meda Coffee 11/13/2018 Weight: 97.1 kg Age: 73 Scr:1.07, 11/13/2018 CrCl: 86 ml/min  Prescription refill sent.

## 2019-01-20 ENCOUNTER — Other Ambulatory Visit: Payer: Self-pay | Admitting: Cardiology

## 2019-02-03 DIAGNOSIS — I4891 Unspecified atrial fibrillation: Secondary | ICD-10-CM | POA: Diagnosis not present

## 2019-02-03 DIAGNOSIS — M1711 Unilateral primary osteoarthritis, right knee: Secondary | ICD-10-CM | POA: Diagnosis not present

## 2019-02-03 DIAGNOSIS — I10 Essential (primary) hypertension: Secondary | ICD-10-CM | POA: Diagnosis not present

## 2019-05-06 DIAGNOSIS — I10 Essential (primary) hypertension: Secondary | ICD-10-CM | POA: Diagnosis not present

## 2019-05-19 ENCOUNTER — Other Ambulatory Visit: Payer: Self-pay | Admitting: Cardiology

## 2019-06-17 ENCOUNTER — Other Ambulatory Visit: Payer: Self-pay | Admitting: Cardiothoracic Surgery

## 2019-06-17 DIAGNOSIS — I712 Thoracic aortic aneurysm, without rupture, unspecified: Secondary | ICD-10-CM

## 2019-06-19 ENCOUNTER — Other Ambulatory Visit: Payer: Self-pay | Admitting: Cardiology

## 2019-06-21 NOTE — Telephone Encounter (Signed)
Prescription refill request for Xarelto received.   Last office visit: Aaron Richmond 11/13/2018 Weight: 97.1 kg Age: 74 y.o. Scr: 1.07 CrCl: 85 ml/min    Prescription refill sent.

## 2019-07-29 DIAGNOSIS — Z96651 Presence of right artificial knee joint: Secondary | ICD-10-CM | POA: Diagnosis not present

## 2019-07-29 DIAGNOSIS — M1610 Unilateral primary osteoarthritis, unspecified hip: Secondary | ICD-10-CM | POA: Diagnosis not present

## 2019-07-29 DIAGNOSIS — M1711 Unilateral primary osteoarthritis, right knee: Secondary | ICD-10-CM | POA: Diagnosis not present

## 2019-08-06 DIAGNOSIS — M1610 Unilateral primary osteoarthritis, unspecified hip: Secondary | ICD-10-CM | POA: Diagnosis not present

## 2019-08-06 DIAGNOSIS — M1611 Unilateral primary osteoarthritis, right hip: Secondary | ICD-10-CM | POA: Diagnosis not present

## 2019-08-11 DIAGNOSIS — I10 Essential (primary) hypertension: Secondary | ICD-10-CM | POA: Diagnosis not present

## 2019-08-11 DIAGNOSIS — M8949 Other hypertrophic osteoarthropathy, multiple sites: Secondary | ICD-10-CM | POA: Diagnosis not present

## 2019-08-16 ENCOUNTER — Ambulatory Visit
Admission: RE | Admit: 2019-08-16 | Discharge: 2019-08-16 | Disposition: A | Discharge status: Home / Self Care | Payer: PPO | Source: Ambulatory Visit | Attending: Cardiothoracic Surgery | Admitting: Cardiothoracic Surgery

## 2019-08-16 DIAGNOSIS — I712 Thoracic aortic aneurysm, without rupture, unspecified: Secondary | ICD-10-CM

## 2019-08-16 DIAGNOSIS — I700 Atherosclerosis of aorta: Secondary | ICD-10-CM | POA: Diagnosis not present

## 2019-08-16 DIAGNOSIS — I2510 Atherosclerotic heart disease of native coronary artery without angina pectoris: Secondary | ICD-10-CM | POA: Diagnosis not present

## 2019-08-16 DIAGNOSIS — J9811 Atelectasis: Secondary | ICD-10-CM | POA: Diagnosis not present

## 2019-08-16 MED ORDER — IOPAMIDOL (ISOVUE-370) INJECTION 76%
75.0000 mL | Freq: Once | INTRAVENOUS | Status: AC | PRN
  Administered 2019-08-16: 75 mL via INTRAVENOUS

## 2019-08-19 ENCOUNTER — Ambulatory Visit: Payer: PPO | Admitting: Cardiothoracic Surgery

## 2019-08-19 ENCOUNTER — Other Ambulatory Visit: Payer: Self-pay

## 2019-08-19 VITALS — BP 140/78 | HR 90 | Temp 98.2°F | Resp 20 | Ht 70.0 in | Wt 220.0 lb

## 2019-08-19 DIAGNOSIS — I712 Thoracic aortic aneurysm, without rupture: Secondary | ICD-10-CM

## 2019-08-19 DIAGNOSIS — I7121 Aneurysm of the ascending aorta, without rupture: Secondary | ICD-10-CM

## 2019-08-19 NOTE — Progress Notes (Signed)
[address]       [place] [address]             [phone]                        Jacarius E Shorten [place] Medical Record #[id number] Date of Birth: [birth year]  Referring: [name], MD Primary Care: Dr Nona Dell Cardiology : Dr Ottie Glazier  Chief Complaint:    Chief Complaint  Patient presents with  . Thoracic Aortic Aneurysm    1 year f/u with CTA Chest     History of Present Illness:    Patient is a 74 year old referred because of dilated ascending aorta noted on CT scan done at [facility] May 11 2012.  I have followed him since that time with serial CT scans .   The  patient notes that in May 2014  he was referred to [facility] for cardiology clearance prior to EAR surgery. He notes that Dr. Jimmie Molly was doing a stress echo on him but never completed it because  a dilated aortic root , moderate aortic regurgitation and  mild to moderate mitral regurgitation was noted . A CT scan of the chest was performed following the echocardiogram 05/11/2012. He notes that he know his aorta was large for "years".   The patient also he has been in atrial fibrillation for several years and has a history of at least 2 pulmonary emboli.  He now sees  Dr. Nelson/cardiology followed   Because of  atrial fibrillation and history of pulmonary emboli and AI . He was started on xarelto. He continues on Xarelto without bleeding difficulty. Last echocardiogram 09/04/2018   He does have known sleep apnea and uses BiPAP at night. He does note some fatigue  continues with good dental care seeing a dentist every 6 months.  He has had no previous history of myocardial infarction, but does have a brother who had a myocardial infarction at age 76. There is no family history of Marfan's or acute aortic dissections.   His echocardiogram demonstrates a trileaflet aortic valve with moderate aortic insufficiency LV dimensions in 6160 and diastolic 57 mm and systolic  45 for comparison in 2016 it was 59 and 44.  Patient denies signs or symptoms of congestive heart failure, no pedal edema, notes that remains 10 usual daily activities without shortness of breath or chest discomfort.  Current Activity/ Functional Status:  Patient is independent with mobility/ambulation, transfers, ADL's, IADL's.  Zubrod Score: At the time of surgery this patient's most appropriate activity status/level should be described as: []  Normal activity, no symptoms [x]  Symptoms, fully ambulatory []  Symptoms, in bed less than or equal to 50% of the time []  Symptoms, in bed greater than 50% of the time but less than 100% []  Bedridden []  Moribund   Past Medical History:  Diagnosis Date  . Aortic regurgitation   . Aortic root aneurysm ([place])   . Atrial fibrillation ([place])   . BPH (benign prostatic hyperplasia)   . Hyperlipidemia   . Hypertension   . Obesity   . Pulmonary embolism ([place])   . Rhinitis   . Sleep apnea    CPAP    Past Surgical History:  Procedure Laterality Date  . APPENDECTOMY    . HERNIA REPAIR    . TONSILLECTOMY      Family History  Problem Relation Age of Onset  . Stroke Mother   .  Alzheimer's disease Mother   . Heart attack Mother   . Stroke Father   . Diabetes Mellitus II Father    No family history of Marfan's or aortic dissection.   Social History   Tobacco Use  Smoking Status Never Smoker  Smokeless Tobacco Never Used    Social History   Substance and Sexual Activity  Alcohol Use No     Allergies  Allergen Reactions  . Statins Other (See Comments)    MYALGIAS    Current Outpatient Medications  Medication Sig Dispense Refill  . acetaminophen (TYLENOL) 500 MG tablet Take 2,000 mg by mouth daily as needed.     Marland Kitchen b complex vitamins tablet Take 1 tablet by mouth daily.    . Chelated Zinc 50 MG TABS Take 1 tablet by mouth daily.    . finasteride (PROSCAR) 5 MG tablet Take 5 mg by mouth daily.    . fish oil-omega-3 fatty  acids 1000 MG capsule Take 1,200 g by mouth daily.    . folic acid (FOLVITE) 469 MCG tablet Take 800 mcg by mouth daily.    . hydrochlorothiazide (HYDRODIURIL) 25 MG tablet Take 1 tablet by mouth once daily 90 tablet 3  . meloxicam (MOBIC) 7.5 MG tablet Take 7.5 mg by mouth daily.    . metoprolol tartrate (LOPRESSOR) 25 MG tablet Take 1 tablet by mouth twice daily 180 tablet 3  . Multiple Vitamins-Minerals (MENS MULTIVITAMIN PLUS PO) Take 1 tablet by mouth daily.    . Potassium 99 MG TABS Take 1 tablet by mouth daily.    . rosuvastatin (CRESTOR) 5 MG tablet TAKE 1 TABLET BY MOUTH 3 TIMES A WEEK AND INCREASE AS TOLERATED. 15 tablet 5  . tamsulosin (FLOMAX) 0.4 MG CAPS capsule Take 0.4 mg by mouth daily after supper.    Marland Kitchen UNABLE TO FIND Take 2 tablets by mouth daily. Med Name:Curamin    . XARELTO 20 MG TABS tablet TAKE 1 TABLET BY MOUTH ONCE DAILY WITH SUPPER 30 tablet 5   No current facility-administered medications for this visit.       Review of Systems:  ROS      Immunizations: Flu [ refused  ]; Pneumococcal[refused  ];   Physical Exam: BP 140/78   Pulse 90   Temp 98.2 F (36.8 C) (Skin)   Resp 20   Ht 5\' 10"  (1.778 m)   Wt 220 lb (99.8 kg)   SpO2 96% Comment: RA  BMI 31.57 kg/m   General appearance: alert and cooperative Head: Normocephalic, without obvious abnormality, atraumatic Neck: no adenopathy, no carotid bruit, no JVD, supple, symmetrical, trachea midline and thyroid not enlarged, symmetric, no tenderness/mass/nodules Lymph nodes: Cervical, supraclavicular, and axillary nodes normal. Resp: clear to auscultation bilaterally Cardio: irregularly irregular rhythm and diastolic murmur: mid diastolic 2/6, decrescendo at 2nd left intercostal space GI: soft, non-tender; bowel sounds normal; no masses,  no organomegaly  Extremities: extremities normal, atraumatic, no cyanosis or edema and Homans sign is negative, no sign of DVT Neurologic: Grossly normal Right knee  incision healing  Diagnostic Studies & Laboratory data:     Recent Radiology Findings: CT ANGIO CHEST AORTA W/CM & OR WO/CM  Result Date: 08/16/2019 CLINICAL DATA:  Thoracic aortic prominence EXAM: CT ANGIOGRAPHY CHEST WITH CONTRAST TECHNIQUE: Multidetector CT imaging of the chest was performed using the standard protocol during bolus administration of intravenous contrast. Multiplanar CT image reconstructions and MIPs were obtained to evaluate the vascular anatomy. CONTRAST:  61mL ISOVUE-370 IOPAMIDOL (ISOVUE-370) INJECTION 76%  COMPARISON:  August 06, 2018 FINDINGS: Cardiovascular: Ascending thoracic aorta again measures 4.8 x 4.8 cm in diameter. The measured diameter at the sinuses of Valsalva measures 4.5 cm. Measured diameter at the aortic arch measures 3.6 cm. The descending thoracic aorta at the main pulmonary outflow tract level measures 2.9 x 2.8 cm. There is no evident thoracic aortic dissection. There are scattered foci of calcification in proximal visualized great vessels as well as scattered foci of aortic atherosclerosis. There are scattered foci of coronary artery calcification. There is no pericardial effusion or pericardial thickening. No demonstrable pulmonary embolus. Mediastinum/Nodes: No thyroid lesions evident. There are occasional subcentimeter mediastinal lymph nodes which do not meet size criteria for pathologic significance. There is a lymph node to the right of the carina anteriorly measuring 1.2 x 1.1 cm, also present previously. No esophageal lesions are evident. Lungs/Pleura: There are scattered areas of lower lung region atelectasis. There is no edema or airspace opacity. No pleural effusions evident. 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 Visualized upper abdominal structures otherwise appear unremarkable. Musculoskeletal: No blastic or lytic bone lesions. No evident chest wall lesions. Review of the MIP images confirms the above  findings. IMPRESSION: 1. Stable ascending thoracic aortic prominence with ascending thoracic aortic diameter measuring 4.8 x 4.8 cm. Other measurements as described. No dissection evident. Foci of aortic atherosclerosis as well as great vessel and coronary artery calcification noted. Ascending thoracic aortic aneurysm. Recommend semi-annual imaging followup by CTA or MRA and referral to cardiothoracic surgery if not already obtained. This recommendation follows 2010 ACCF/AHA/AATS/ACR/ASA/SCA/SCAI/SIR/STS/SVM Guidelines for the Diagnosis and Management of Patients With Thoracic Aortic Disease. Circulation. 2010; 121: D741-O878. Aortic aneurysm NOS (ICD10-I71.9). 2.  No demonstrable pulmonary embolus. 3. Areas of atelectatic change bilaterally. No edema or airspace opacity. No pleural effusions. 4. Stable mildly prominent precarinal lymph node on the right. No other lymph node prominence evident. 5. 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 Aortic Atherosclerosis (ICD10-I70.0). Electronically Signed   By: Lowella Grip III M.D.   On: 08/16/2019 10:47  Ct Angio Chest Aorta W &/or Wo Contrast  Result Date: 08/06/2018 CLINICAL DATA:  F/u TAA, hx of PE No complaints Cancer-none Surgery-none EXAM: CT ANGIOGRAPHY CHEST WITH CONTRAST TECHNIQUE: Multidetector CT imaging of the chest was performed using the standard protocol during bolus administration of intravenous contrast. Multiplanar CT image reconstructions and MIPs were obtained to evaluate the vascular anatomy. CONTRAST:  70mL ISOVUE-370 IOPAMIDOL (ISOVUE-370) INJECTION 76% Creatinine was obtained on site at [facility] at [address] Results: Creatinine 1.2 mg/dL. GFR 60. COMPARISON:  CT angio chest 11/27/2017 FINDINGS: Cardiovascular: There is atherosclerotic calcification of the thoracic aorta. Ascending aorta is stable in appearance, measuring 4.8 centimeters just above the aortic valve plane. Previously the  aorta measured 4.9 centimeters. Thoracic arch is not aneurysmal. Mediastinum/Nodes: Esophagus is normal in appearance. The visualized portion of the thyroid gland has a normal appearance. No mediastinal, hilar, or axillary adenopathy. Lungs/Pleura: There is minimal bibasilar atelectasis. Small calcified pleural calcifications are consistent with granulomatous disease. No suspicious pulmonary nodules. No pleural effusions or consolidations. Upper Abdomen: No acute abnormality. Musculoskeletal: No chest wall abnormality. No acute or significant osseous findings. Review of the MIP images confirms the above findings. IMPRESSION: 1. Technically adequate exam showing no evidence for acute abnormality. 2. Stable appearance of ascending aortic aneurysm, measuring 4.8 cm. Recommend semi-annual imaging followup by CTA or MRA and referral to cardiothoracic surgery  if not already obtained. This recommendation follows 2010 ACCF/AHA/AATS/ACR/ASA/SCA/SCAI/SIR/STS/SVM Guidelines for the Diagnosis and Management of Patients With Thoracic Aortic Disease. Circulation. 2010; 121: L892-J19. Aortic aneurysm NOS (ICD10-I71.9) 3. Calcified pleural calcifications consistent with granulomatous disease. 4. Aortic Atherosclerosis (ICD10-I70.0). Electronically Signed   By: Nolon Nations M.D.   On: 08/06/2018 15:12    I have independently reviewed the above radiology studies  and reviewed the findings with the patient.   Ct Angio Chest Aorta W/cm &/or Wo/cm  Result Date: 03/21/2016 CLINICAL DATA:  Follow-up aortic root aneurysm EXAM: CT ANGIOGRAPHY CHEST WITH CONTRAST TECHNIQUE: Multidetector CT imaging of the chest was performed using the standard protocol during bolus administration of intravenous contrast. Multiplanar CT image reconstructions and MIPs were obtained to evaluate the vascular anatomy. CONTRAST:  75 mL Isovue 370. Creatinine was obtained on site at [facility] at [address]Results: Creatinine 1.1 mg/dL.  COMPARISON:  02/09/2015 FINDINGS: Cardiovascular: There is prominence of the ascending aorta again identified. At the level of the sinus of Valsalva, the aorta measures approximately 4.5 cm. The ascending aorta measures approximately 4.9 cm in greatest dimension stable in appearance from the prior exam. Despite being a gated study there is some persistent motion artifact identified. Mild coronary calcifications are again seen. The aortic arch tapers in a normal fashion. The brachiocephalic vessels are within normal limits with mild atherosclerotic calcifications. The descending thoracic aorta is within normal limits. The pulmonary artery is incompletely evaluated although no large central pulmonary embolus is noted. Mediastinum/Nodes: The thoracic inlet is within normal limits. No significant hilar or mediastinal adenopathy is noted. A few scattered small lymph nodes are noted stable from the prior exam. Lungs/Pleura: Lungs are well aerated bilaterally without focal infiltrate or sizable parenchymal nodule. No effusion is seen. Upper Abdomen: Visualized upper abdomen is within normal limits. Musculoskeletal: No acute bony abnormality is noted. Review of the MIP images confirms the above findings. IMPRESSION: Stable appearance of the ascending aorta dilated to 4.9 cm in maximum dimension. Ascending thoracic aortic aneurysm. Recommend semi-annual imaging followup by CTA or MRA. This recommendation follows 2010 ACCF/AHA/AATS/ACR/ASA/SCA/SCAI/SIR/STS/SVM Guidelines for the Diagnosis and Management of Patients With Thoracic Aortic Disease. Circulation. 2010; 121: E174-Y814 No other focal abnormality is seen. Electronically Signed   By: Inez Catalina M.D.   On: 03/21/2016 14:35   Ct Angio Chest Aorta W/cm &/or Wo/cm  02/09/2015  CLINICAL DATA:  Aneurysmal disease of the ascending thoracic aorta. EXAM: CT ANGIOGRAPHY CHEST WITH CONTRAST TECHNIQUE: Multidetector CT imaging of the chest was performed using the standard  protocol during bolus administration of intravenous contrast. Multiplanar CT image reconstructions and MIPs were obtained to evaluate the vascular anatomy. CONTRAST:  100 mL Isovue 370 IV COMPARISON:  01/06/2014, 05/06/2013 and 05/11/2012 FINDINGS: Stable aneurysmal disease of the ascending thoracic aorta measures 4.9 cm in greatest diameter. This is stable dating back to 2014. The aorta measures 4.4 cm at the sinuses of Valsalva. The proximal arch measures 3.7 cm. The distal arch measures 3.1 cm. The descending thoracic aorta measures 2.8 cm. No evidence of aortic dissection. Proximal great vessels show stable and normal patency. The innominate artery is tortuous. Stable cardiac enlargement. No pleural or pericardial fluid identified. Lungs show no evidence of edema, infiltrate or nodule. No enlarged lymph nodes are seen. Bony structures are unremarkable. Review of the MIP images confirms the above findings. IMPRESSION: Stable aneurysmal disease of the ascending thoracic aorta measuring 4.9 cm in greatest diameter. Electronically Signed   By: Aletta Edouard  M.D.   On: 02/09/2015 10:32      Ct Angio Chest Aorta W/cm &/or Wo/cm  01/06/2014   CLINICAL DATA:  Followup ascending thoracic aortic aneurysm.  EXAM: CT ANGIOGRAPHY CHEST WITH CONTRAST  TECHNIQUE: Multidetector CT imaging of the chest was performed using the standard protocol during bolus administration of intravenous contrast. Multiplanar CT image reconstructions and MIPs were obtained to evaluate the vascular anatomy.  CONTRAST:  93mL OMNIPAQUE IOHEXOL 350 MG/ML SOLN  COMPARISON:  05/06/2013.  FINDINGS: The maximum aortic diameter at the sinus of Valsalva, sino-tubular junction and ascending thoracic aorta are 4.0 cm, 4.6 cm and 4.4 cm respectively. These were previously 4.5 cm, 4.9 cm and 4.6 cm respectively, measured at the same levels.  The degree of dilatation of the left atrium is unchanged. Minimal residual atelectasis or scarring at both lung  bases. No lung nodules or enlarged lymph nodes. Stable mild narrowing of the origin of the celiac axis. Mild thoracic spine degenerative changes.  Review of the MIP images confirms the above findings.  IMPRESSION: Interval mild decrease in size of the previously demonstrated dilated aortic root, sino-tubular junction and ascending thoracic aorta. The maximum diameter today is 4.6 cm at the sino-tubular junction.   Electronically Signed   By: Enrique Sack M.D.   On: 01/06/2014 11:56     Ct Angio Chest Aorta W/cm &/or Wo/cm  05/06/2013   CLINICAL DATA:  Aortic root evaluation  EXAM: CT ANGIOGRAPHY CHEST WITH CONTRAST  TECHNIQUE: Multidetector CT imaging of the chest was performed using the standard protocol during bolus administration of intravenous contrast. Multiplanar CT image reconstructions and MIPs were obtained to evaluate the vascular anatomy.  CONTRAST:  63mL OMNIPAQUE IOHEXOL 350 MG/ML SOLN  COMPARISON:  None.  FINDINGS: Maximal aortic diameter is at the sinus of Valsalva, sino-tubular junction, and ascending aorta are 4.5 cm, 4.9 cm, and 4.6 cm respectively. Mild coronary artery calcifications in the circumflex and LAD coronary arteries. Minimal right coronary artery calcifications.  Great vessels are patent. Vertebral arteries are patent within the confines of the exam.  There is mild dilatation of the left atrium. Very minimal peripheral aortic valve calcifications.  Limited images of the abdomen demonstrate mild narrowing at the origin of the celiac axis and patency at the origin of the SMA.  No abnormal mediastinal adenopathy.  No mediastinal mass effect.  Bibasilar atelectasis.  No pneumothorax or pleural effusion. Chronic right upper rib deformities. No definite acute bony deformity. Posterior disc osteophytes at C6-7 with severe disc space narrowing.  Review of the MIP images confirms the above findings.  IMPRESSION: There is significant dilatation of the aortic root as described. The  sino-tubular junction is 4.9 cm in diameter. No evidence of dissection. Minimal peripheral aortic valvular calcifications.   Electronically Signed   By: Maryclare Bean M.D.   On: 05/06/2013 16:21   05/11/2012 CT of Chest: CT ANGIOGRAPHY CHEST Comparison: CT scan of June 20, 2008. Findings: No significant abnormality seen involving the pulmonary parenchyma. No pleural effusions or pneumothorax are noted. Normal contrast filling of pulmonary arteries is noted. There is no evidence of aortic dissection. Coronary artery calcifications are noted consistent with coronary artery disease. There is mild dilatation of the ascending thoracic aorta is again noted which now measures 5.0 cm which is slightly increased compared to prior exam of 2010. No mass or adenopathy is noted in the mediastinum. Visualized portions of upper abdomen are within normal limits.  IMPRESSION: No evidence of thoracic aortic dissection is  noted. Coronary artery calcifications are noted suggesting coronary artery disease. Aneurysmal dilatation of the ascending thoracic aorta is noted with maximum measured diameter of 5.0 cm which has increased compared to prior exam of 2010. Original Report Authenticated By: Marijo Conception., M.D.    02/2006 CT of Chest Satisfactory contrast-opacification of pulmonary vasculature. Right upper lobe evidence for pulmonary thromboembolism. Ascending aorta measures 4.4 cm transverse by 4.7 cm AP. Descending aorta measures 3.2 cm transverse by 3.1 cm AP. No acute pulmonary infiltrate. No pleural effusion. No adenopathy. Normal cardiac size. 11/02/2007 CT of Chest: Findings: The chest wall is stable. No supraclavicular or axillary adenopathy. The thyroid gland appears normal.  The heart is borderline enlarged but stable. No pericardial effusion. Stable small scattered mediastinal and hilar lymph nodes.  No change and fusiform aneurysmal dilatation of the ascending aorta with maximal  measurements of 4.7 x 4.7 cm. No dissection. The major branch vessels are patent. The esophagus is grossly normal. There is a filling defect and a right lower lobe pulmonary artery consistent with pulmonary embolism. There is tortuosity of the descending thoracic aorta.  Examination of the lung parenchyma demonstrates minimal lingular scarring change. The airspace consolidation has resolved and this may be post pneumonic scarring. No infiltrates, edema or effusions. No interstitial lung disease. No pulmonary masses or nodules.  The upper abdomen is stable. There is a small left adrenal gland nodule which is likely a benign adenoma.  IMPRESSION:  1. Incidental pulmonary embolism is noted in a right lower lobe pulmonary artery. 2. Near complete resolution of left lingular process. There is minimal residual probable post pneumonic scarring changes. No acute pulmonary findings or pulmonary masses. 3. Stable ascending thoracic aortic aneurysm. 4. Borderline cardiac size, stable. 5. Stable small mediastinal and hilar lymph nodes. 6. Stable left adrenal gland nodule, likely benign adenoma. 7. Stable periportal and celiac axis lymph nodes.  06/20/2008 CT of Chest: Comparison: CT chest without and with contrast 11/02/2007. Findings: Measured in the axial plane at a level approximately 2.5 cm above the aortic valve on series 4, image 30, maximum diameter of the ascending thoracic aorta remains unchanged at 4.7 cm. Tortuous aortic arch and proximal great vessels without significant atherosclerosis. Mild descending thoracic and upper abdominal aortic atherosclerosis. No evidence of aortic dissection. Heart mildly enlarged but stable. No visible coronary artery calcification. No pericardial effusion. Interval resolution of the segmental right lower lobe of pulmonary embolus; no central pulmonary emboli currently.  Stable scattered normal-sized mediastinal and hilar lymph nodes; no new,  enlarging, or suspicious lymphadenopathy. Normal thyroid gland. Minimal scarring in the lingula and in the lower lobes. Lungs otherwise clear without localized airspace consolidation, interstitial disease, or nodularity. High resolution expiratory images demonstrate scattered areas of hyperlucency consistent with localized air trapping. Stable approximate 1.1 cm left adrenal adenoma. Normal-sized porta hepatis and celiac axis lymph nodes again noted and unchanged. Remaining visualized upper abdomen unremarkable for the early arterial phase of enhancement. Bone window images again demonstrate mild thoracic spondylosis. Echo: 08/2018 1. The left ventricle has low normal systolic function, with an ejection fraction of 50-55%. The cavity size was severely dilated. Left ventricular diastolic function could not be evaluated secondary to atrial fibrillation. No evidence of left ventricular regional wall motion abnormalities. 2. The right ventricle has normal systolic function. The cavity was mildly enlarged. There is no increase in right ventricular wall thickness. Right ventricular systolic pressure is normal. 3. Left atrial size was severely dilated. 4. There is moderate dilatation of the  sinus of Valsalva at 93mm, 57mm at the sinotubular junction and 24mm in the ascending aorta. 5. Mitral valve regurgitation is mild to moderate by color flow Doppler. 6. Aortic valve regurgitation is moderate by color flow Doppler.    PFT's: FEV1 2.08  61% +10% post treatment  DLCO 115 %   Study Highlights     Nuclear stress EF: 49%.  There was no ST segment deviation noted during stress.  This is a low risk study.  The left ventricular ejection fraction is mildly decreased (45-54%).   Low risk stress nuclear study with normal perfusion mildly dilated left ventricle with mildly reduced global systolic function. Findings suggest nonischemic cardiomyopathy.    Most recent echo  02/2017  Assessment / Plan:     1)  Dilated ascending aorta, chronic 4.7 cm in 2008 now 4.8 cm today  ,                    Aortic Size Index=    4.8     /Body surface area is 2.27 meters squared. = 2.15                                                      < 2.75 cm/m2      4% risk per year                                                       2.75 to 4.25          8% risk per year                                                      > 4.25 cm/m2    20% risk per year  #2 valvular heart disease by echo report: Trileaflet aortic valve-last echo 2020-  moderate aortic insufficiency    #3 chronic atrial fibrillation: now on anticoagulation #4 extensive coronary artery calcification on CT scan, positive family history of early coronary artery disease, no previous history of ischemic events, no current symptoms-negative nuclear stress test November 2019 #5 history of pulmonary emboli x2, no history of workup for hypercoagulable state-on Xarelto #6 obstructive sleep apnea/Moderaltly Severe Airflow limitation #7 hyperlipidemia  #8 benign prostatic hypertrophy #9 history of hypertension, he is currently on a beta blocker   I reviewed with the patient the most recent CT scan and recommend that we repeat this CT of the chest in 8 months He has an appointment with cardiology in the coming month and presumably will have repeat 6 echocardiogram to evaluate his known aortic insufficiency and LV function    Grace Isaac MD      [address] [place] [address] Office [phone]   Beeper [phone]  08/19/2019 11:55 AM

## 2019-08-19 NOTE — Patient Instructions (Signed)

## 2019-08-22 ENCOUNTER — Encounter: Payer: Self-pay | Admitting: Cardiothoracic Surgery

## 2019-08-26 DIAGNOSIS — M4316 Spondylolisthesis, lumbar region: Secondary | ICD-10-CM | POA: Diagnosis not present

## 2019-09-06 DIAGNOSIS — M545 Low back pain: Secondary | ICD-10-CM | POA: Diagnosis not present

## 2019-09-06 DIAGNOSIS — M5117 Intervertebral disc disorders with radiculopathy, lumbosacral region: Secondary | ICD-10-CM | POA: Diagnosis not present

## 2019-09-06 DIAGNOSIS — G8929 Other chronic pain: Secondary | ICD-10-CM | POA: Diagnosis not present

## 2019-09-06 DIAGNOSIS — M4316 Spondylolisthesis, lumbar region: Secondary | ICD-10-CM | POA: Diagnosis not present

## 2019-09-06 DIAGNOSIS — M5116 Intervertebral disc disorders with radiculopathy, lumbar region: Secondary | ICD-10-CM | POA: Diagnosis not present

## 2019-09-06 DIAGNOSIS — M5136 Other intervertebral disc degeneration, lumbar region: Secondary | ICD-10-CM | POA: Diagnosis not present

## 2019-09-06 DIAGNOSIS — M48061 Spinal stenosis, lumbar region without neurogenic claudication: Secondary | ICD-10-CM | POA: Diagnosis not present

## 2019-09-10 DIAGNOSIS — M5416 Radiculopathy, lumbar region: Secondary | ICD-10-CM | POA: Diagnosis not present

## 2019-09-10 DIAGNOSIS — M4316 Spondylolisthesis, lumbar region: Secondary | ICD-10-CM | POA: Diagnosis not present

## 2019-09-14 ENCOUNTER — Encounter: Payer: Self-pay | Admitting: Cardiology

## 2019-09-14 ENCOUNTER — Other Ambulatory Visit: Payer: Self-pay

## 2019-09-14 ENCOUNTER — Ambulatory Visit: Payer: PPO | Admitting: Cardiology

## 2019-09-14 VITALS — BP 132/78 | HR 60 | Ht 70.0 in | Wt 218.8 lb

## 2019-09-14 DIAGNOSIS — I712 Thoracic aortic aneurysm, without rupture: Secondary | ICD-10-CM

## 2019-09-14 DIAGNOSIS — I7121 Aneurysm of the ascending aorta, without rupture: Secondary | ICD-10-CM

## 2019-09-14 DIAGNOSIS — I4819 Other persistent atrial fibrillation: Secondary | ICD-10-CM

## 2019-09-14 DIAGNOSIS — E782 Mixed hyperlipidemia: Secondary | ICD-10-CM | POA: Diagnosis not present

## 2019-09-14 DIAGNOSIS — I351 Nonrheumatic aortic (valve) insufficiency: Secondary | ICD-10-CM

## 2019-09-14 DIAGNOSIS — I10 Essential (primary) hypertension: Secondary | ICD-10-CM

## 2019-09-14 NOTE — Patient Instructions (Addendum)
Medication Instructions:   Your physician recommends that you continue on your current medications as directed. Please refer to the Current Medication list given to you today.  *If you need a refill on your cardiac medications before your next appointment, please call your pharmacy*   Testing/Procedures:  Your physician has requested that you have an echocardiogram. Echocardiography is a painless test that uses sound waves to create images of your heart. It provides your doctor with information about the size and shape of your heart and how well your heart's chambers and valves are working. This procedure takes approximately one hour. There are no restrictions for this procedure.   Follow-Up: At [facility], you and your health needs are our priority.  As part of our continuing mission to provide you with exceptional heart care, we have created designated Provider Care Teams.  These Care Teams include your primary Cardiologist (physician) and Advanced Practice Providers (APPs -  Physician Assistants and Nurse Practitioners) who all work together to provide you with the care you need, when you need it.  We recommend signing up for the patient portal called "MyChart".  Sign up information is provided on this After Visit Summary.  MyChart is used to connect with patients for Virtual Visits (Telemedicine).  Patients are able to view lab/test results, encounter notes, upcoming appointments, etc.  Non-urgent messages can be sent to your provider as well.   To learn more about what you can do with MyChart, go to [email or web address].    Your next appointment:   6 month(s)  The format for your next appointment:   In Person  Provider:   Katarina Nelson, MD    

## 2019-09-14 NOTE — Progress Notes (Signed)
Cardiology Office Note    Date:  09/14/2019   ID:  Aaron Richmond, DOB 30-Mar-1945, MRN [id number]  PCP:  [name], MD  Cardiologist: [name], MD EPS: None  Reason for visit: 1 year follow-up  History of Present Illness:  Aaron Richmond is a 74 y.o. male with history of persistent atrial fibrillation CHA2DS2-VASc score equals 3 on Xarelto, dilated asending aortic aneurysm 4.9 cm on CTA 03/2017 followed yearly by Dr. [name] who does not want operate unless diameter reaches 55 mm or there is another indication.  2D echo 02/2017 LVEF 50 to 55% with moderate aortic regurgitation.  Also has extensive coronary calcification on CT scan and positive family history of early CAD per patient has had no angina.  Also has hypertension, HLD. History of Pulmonary embolus after a long plane ride about 2009.  11/13/2018 -patient is coming after year, he has been doing great, he has retired and is working the house, does not exercise.  He denies any chest pain, shortness of breath no lower extremity edema orthopnea proximal nocturnal dyspnea.  He underwent a knee replacement a year ago with some swelling after surgery but that has resolved.  He is experiencing hip pain for which he might need surgery future.  He denies any palpitations dizziness syncope or falls.  09/14/2019 -the patient is coming after a year, he has been doing well, he denies any palpitation dizziness or syncope.  No bleeding with Xarelto.  He held Xarelto for 2 days prior to recent epidural injection.  He has restarted Xarelto a day post injection.  He denies any lower extremity edema or claudication.  Gets occasional dyspnea on exertion but overall is doing well, no orthopnea or proximal nocturnal dyspnea.  Past Medical History:  Diagnosis Date  . Aortic regurgitation   . Aortic root aneurysm ([place])   . Atrial fibrillation ([place])   . BPH (benign prostatic hyperplasia)   . Hyperlipidemia   . Hypertension   . Obesity   .  Pulmonary embolism ([place])   . Rhinitis   . Sleep apnea    CPAP    Past Surgical History:  Procedure Laterality Date  . APPENDECTOMY    . HERNIA REPAIR    . TONSILLECTOMY     Current Medications: Current Meds  Medication Sig  . acetaminophen (TYLENOL) 500 MG tablet Take 2,000 mg by mouth daily as needed.   Marland Kitchen b complex vitamins tablet Take 1 tablet by mouth daily.  . Chelated Zinc 50 MG TABS Take 1 tablet by mouth daily.  . finasteride (PROSCAR) 5 MG tablet Take 5 mg by mouth daily.  . fish oil-omega-3 fatty acids 1000 MG capsule Take 1,200 g by mouth daily.  . folic acid (FOLVITE) 542 MCG tablet Take 800 mcg by mouth daily.  . hydrochlorothiazide (HYDRODIURIL) 25 MG tablet Take 1 tablet by mouth once daily  . levocetirizine (XYZAL) 5 MG tablet Take 5 mg by mouth every evening.  . meloxicam (MOBIC) 7.5 MG tablet Take 7.5 mg by mouth in the morning and at bedtime.   . metoprolol tartrate (LOPRESSOR) 25 MG tablet Take 1 tablet by mouth twice daily  . Multiple Vitamins-Minerals (MENS MULTIVITAMIN PLUS PO) Take 1 tablet by mouth daily.  . Nettle, Urtica Dioica, (NETTLE LEAF PO) Take by mouth.  . Potassium 99 MG TABS Take 1 tablet by mouth daily.  . ranitidine (ZANTAC) 75 MG tablet Take 75 mg by mouth 2 (two) times daily.  . rosuvastatin (CRESTOR)  5 MG tablet TAKE 1 TABLET BY MOUTH 3 TIMES A WEEK AND INCREASE AS TOLERATED.  Marland Kitchen tamsulosin (FLOMAX) 0.4 MG CAPS capsule Take 0.4 mg by mouth daily after supper.  Marland Kitchen TART CHERRY PO Take by mouth.  Marland Kitchen UNABLE TO FIND Take 2 tablets by mouth daily. Med Name:Curamin  . XARELTO 20 MG TABS tablet TAKE 1 TABLET BY MOUTH ONCE DAILY WITH SUPPER     Allergies:   Quinolones and Statins   Social History   Socioeconomic History  . Marital status: Married    Spouse name: Not on file  . Number of children: 0  . Years of education: Not on file  . Highest education level: Not on file  Occupational History  . Occupation: Engineering geologist  Tobacco Use  .  Smoking status: Never Smoker  . Smokeless tobacco: Never Used  Vaping Use  . Vaping Use: Never used  Substance and Sexual Activity  . Alcohol use: No  . Drug use: No  . Sexual activity: Not on file  Other Topics Concern  . Not on file  Social History Narrative  . Not on file   Social Determinants of Health   Financial Resource Strain:   . Difficulty of Paying Living Expenses: Not on file  Food Insecurity:   . Worried About Charity fundraiser in the Last Year: Not on file  . Ran Out of Food in the Last Year: Not on file  Transportation Needs:   . Lack of Transportation (Medical): Not on file  . Lack of Transportation (Non-Medical): Not on file  Physical Activity:   . Days of Exercise per Week: Not on file  . Minutes of Exercise per Session: Not on file  Stress:   . Feeling of Stress : Not on file  Social Connections:   . Frequency of Communication with Friends and Family: Not on file  . Frequency of Social Gatherings with Friends and Family: Not on file  . Attends Religious Services: Not on file  . Active Member of Clubs or Organizations: Not on file  . Attends Archivist Meetings: Not on file  . Marital Status: Not on file    Family History:  The patient's family history includes Alzheimer's disease in his mother; Diabetes Mellitus II in his father; Heart attack in his mother; Stroke in his father and mother.   ROS:   Please see the history of present illness.    Review of Systems  Constitutional: Negative.  HENT: Negative.   Cardiovascular: Negative.   Respiratory: Negative.   Endocrine: Negative.   Hematologic/Lymphatic: Negative.   Musculoskeletal: Positive for arthritis and joint pain.  Gastrointestinal: Negative.   Genitourinary: Negative.   Neurological: Negative.    All other systems reviewed and are negative.  PHYSICAL EXAM:   VS:  BP 132/78   Pulse 60   Ht 5\' 10"  (1.778 m)   Wt 218 lb 12.8 oz (99.2 kg)   SpO2 99%   BMI 31.39 kg/m    Physical Exam  GEN: Well nourished, well developed, in no acute distress  Neck: no JVD, carotid bruits, or masses Cardiac:irreg irreg; 5 out of 6 diastolic murmur, rubs, or gallops  Respiratory:  clear to auscultation bilaterally, normal work of breathing GI: soft, nontender, nondistended, + BS Ext: without cyanosis, clubbing, or edema, Good distal pulses bilaterally Neuro:  Alert and Oriented x 3 Psych: euthymic mood, full affect  Wt Readings from Last 3 Encounters:  09/14/19 218 lb 12.8 oz (99.2  kg)  08/19/19 220 lb (99.8 kg)  11/13/18 214 lb (97.1 kg)      Studies/Labs Reviewed:   EKG:  EKG is not ordered today.  The ekg reviewed from 05/2017 Atrial fib  Recent Labs: 11/13/2018: ALT 11; BUN 22; Creatinine, Ser 1.07; Hemoglobin 14.2; Platelets 228; Potassium 3.7; Sodium 140; TSH 1.420   Lipid Panel    Component Value Date/Time   CHOL 132 11/13/2018 0924   TRIG 132 11/13/2018 0924   HDL 34 (L) 11/13/2018 0924   CHOLHDL 3.9 11/13/2018 0924   LDLCALC 74 11/13/2018 0924    Additional studies/ records that were reviewed today include:   TTE 09/04/2018    1. The left ventricle has low normal systolic function, with an ejection fraction of 50-55%. The cavity size was severely dilated. Left ventricular diastolic function could not be evaluated secondary to atrial fibrillation. No evidence of left  ventricular regional wall motion abnormalities.  2. The right ventricle has normal systolic function. The cavity was mildly enlarged. There is no increase in right ventricular wall thickness. Right ventricular systolic pressure is normal.  3. Left atrial size was severely dilated.  4. There is moderate dilatation of the sinus of Valsalva at 30mm, 12mm at the sinotubular junction and 72mm in the ascending aorta.  5. Mitral valve regurgitation is mild to moderate by color flow    Chest CTA 08/06/2018  1. Technically adequate exam showing no evidence for acute abnormality. 2. Stable  appearance of ascending aortic aneurysm, measuring 4.8 cm. Recommend semi-annual imaging followup by CTA or MRA and referral to cardiothoracic surgery if not already obtained. This recommendation follows 2010 ACCF/AHA/AATS/ACR/ASA/SCA/SCAI/SIR/STS/SVM Guidelines for the Diagnosis and Management of Patients With Thoracic Aortic Disease. Circulation. 2010; 121: V761-Y07. Aortic aneurysm NOS (ICD10-I71.9) 3. Calcified pleural calcifications consistent with granulomatous disease. 4. Aortic Atherosclerosis (ICD10-I70.0).   ASSESSMENT:    1. Ascending aortic aneurysm ([place])   2. Aortic valve insufficiency, etiology of cardiac valve disease unspecified   3. Persistent atrial fibrillation (H[facility])    PLAN:   In order of problems listed above:  Ascending aortic aneurysm -stable at 48 mm on CTA in August 2021, followed by Dr. [name]  PAF chads vas score equals 3 on Xarelto.  No bleeding.  Aortic valve insufficiency moderate on 2D echo in August 2020, however LV dilatation, he might be coming to the point of surgery soon.  We will repeat an echocardiogram now.  If he requires AVR he will also have ascending root replacement as well.  Essential hypertension -controlled on current regimen  History of pulmonary embolism after a long flight in 2009.  On Xarelto.  Coronary artery calcification seen on CT scan and strong family history of early CAD but is asymptomatic without chest pain  Hyperlipidemia with statin allergy.  Now on 5 mg of rosuvastatin 3 times a week, and fish oil, he eats unrestricted diet, advised on heart healthy diet Mediterranean style and increase his exercise level.  We will check lipids today.  Medication Adjustments/Labs and Tests Ordered: Current medicines are reviewed at length with the patient today.  Concerns regarding medicines are outlined above.  Medication changes, Labs and Tests ordered today are listed in the Patient Instructions below. Patient Instructions   Medication Instructions:   Your physician recommends that you continue on your current medications as directed. Please refer to the Current Medication list given to you today.  *If you need a refill on your cardiac medications before your next appointment, please call your  pharmacy*  Testing/Procedures:  Your physician has requested that you have an echocardiogram. Echocardiography is a painless test that uses sound waves to create images of your heart. It provides your doctor with information about the size and shape of your heart and how well your heart's chambers and valves are working. This procedure takes approximately one hour. There are no restrictions for this procedure.   Follow-Up: At [facility], you and your health needs are our priority.  As part of our continuing mission to provide you with exceptional heart care, we have created designated Provider Care Teams.  These Care Teams include your primary Cardiologist (physician) and Advanced Practice Providers (APPs -  Physician Assistants and Nurse Practitioners) who all work together to provide you with the care you need, when you need it.  We recommend signing up for the patient portal called "MyChart".  Sign up information is provided on this After Visit Summary.  MyChart is used to connect with patients for Virtual Visits (Telemedicine).  Patients are able to view lab/test results, encounter notes, upcoming appointments, etc.  Non-urgent messages can be sent to your provider as well.   To learn more about what you can do with MyChart, go to [email or web address].    Your next appointment:   6 month(s)  The format for your next appointment:   In Person  Provider:   [name], MD        Signed, [name], MD  09/14/2019 10:39 AM    [address], [place], [place]  [address] Phone: [phone]; Fax: [phone]

## 2019-09-15 ENCOUNTER — Other Ambulatory Visit: Payer: Self-pay

## 2019-09-15 MED ORDER — ROSUVASTATIN CALCIUM 5 MG PO TABS: ORAL_TABLET | ORAL | 3 refills | Status: DC

## 2019-10-07 ENCOUNTER — Other Ambulatory Visit: Payer: Self-pay

## 2019-10-07 ENCOUNTER — Ambulatory Visit (INDEPENDENT_AMBULATORY_CARE_PROVIDER_SITE_OTHER): Payer: PPO

## 2019-10-07 DIAGNOSIS — I351 Nonrheumatic aortic (valve) insufficiency: Secondary | ICD-10-CM | POA: Diagnosis not present

## 2019-10-07 DIAGNOSIS — I7121 Aneurysm of the ascending aorta, without rupture: Secondary | ICD-10-CM

## 2019-10-07 DIAGNOSIS — I712 Thoracic aortic aneurysm, without rupture: Secondary | ICD-10-CM | POA: Diagnosis not present

## 2019-10-07 LAB — ECHOCARDIOGRAM COMPLETE
Calc EF: 48.8 %
P 1/2 time: 328 msec
S' Lateral: 4.2 cm
Single Plane A2C EF: 47.9 %
Single Plane A4C EF: 47.6 %

## 2019-10-07 NOTE — Progress Notes (Signed)
Complete echocardiogram performed.  Jimmy Vasily Fedewa RDCS, RVT  

## 2019-10-11 ENCOUNTER — Telehealth: Payer: Self-pay | Admitting: *Deleted

## 2019-10-11 MED ORDER — LOSARTAN POTASSIUM 25 MG PO TABS
25.0000 mg | ORAL_TABLET | Freq: Every day | ORAL | 3 refills | Status: DC
Start: 2019-10-11 — End: 2020-10-16

## 2019-10-11 NOTE — Telephone Encounter (Signed)
Pt has been notified of echo results by phone with verbal understanding. Pt agreeable to plan of care to start Losartan 25 mg daily; Rx has been called into [facility]  in [place]. Pt thanked me for the call and the help. Patient notified of result.  Please refer to phone note from today for complete details.   Julaine Hua, CMA 10/11/2019 9:05 AM

## 2019-10-11 NOTE — Telephone Encounter (Signed)
-----   Message from Nuala Alpha, LPN sent at 18/02/9935  8:59 AM EDT -----  ----- Message ----- From: Dorothy Spark, MD Sent: 10/08/2019   7:11 PM EDT To: Nuala Alpha, LPN  Unchanged aortic regurgitation - moderate, however slightly worse LVEF, previously 50-55%, now 45-50%, I would add losartan 25 mg po daily to his regimen

## 2019-10-28 DIAGNOSIS — R2681 Unsteadiness on feet: Secondary | ICD-10-CM | POA: Diagnosis not present

## 2019-10-28 DIAGNOSIS — M4316 Spondylolisthesis, lumbar region: Secondary | ICD-10-CM | POA: Diagnosis not present

## 2019-11-03 DIAGNOSIS — M5441 Lumbago with sciatica, right side: Secondary | ICD-10-CM | POA: Diagnosis not present

## 2019-11-03 DIAGNOSIS — M6281 Muscle weakness (generalized): Secondary | ICD-10-CM | POA: Diagnosis not present

## 2019-11-03 DIAGNOSIS — M25551 Pain in right hip: Secondary | ICD-10-CM | POA: Diagnosis not present

## 2019-11-03 DIAGNOSIS — R2689 Other abnormalities of gait and mobility: Secondary | ICD-10-CM | POA: Diagnosis not present

## 2019-11-12 DIAGNOSIS — Z1389 Encounter for screening for other disorder: Secondary | ICD-10-CM | POA: Diagnosis not present

## 2019-11-12 DIAGNOSIS — Z6832 Body mass index (BMI) 32.0-32.9, adult: Secondary | ICD-10-CM | POA: Diagnosis not present

## 2019-11-12 DIAGNOSIS — E669 Obesity, unspecified: Secondary | ICD-10-CM | POA: Diagnosis not present

## 2019-11-12 DIAGNOSIS — I4891 Unspecified atrial fibrillation: Secondary | ICD-10-CM | POA: Diagnosis not present

## 2019-11-12 DIAGNOSIS — Z23 Encounter for immunization: Secondary | ICD-10-CM | POA: Diagnosis not present

## 2019-11-12 DIAGNOSIS — Z0000 Encounter for general adult medical examination without abnormal findings: Secondary | ICD-10-CM | POA: Diagnosis not present

## 2019-11-12 DIAGNOSIS — E7800 Pure hypercholesterolemia, unspecified: Secondary | ICD-10-CM | POA: Diagnosis not present

## 2019-11-12 DIAGNOSIS — N400 Enlarged prostate without lower urinary tract symptoms: Secondary | ICD-10-CM | POA: Diagnosis not present

## 2019-11-12 DIAGNOSIS — I10 Essential (primary) hypertension: Secondary | ICD-10-CM | POA: Diagnosis not present

## 2019-11-18 ENCOUNTER — Other Ambulatory Visit: Payer: Self-pay | Admitting: Cardiology

## 2019-12-19 ENCOUNTER — Other Ambulatory Visit: Payer: Self-pay | Admitting: Cardiology

## 2019-12-20 NOTE — Telephone Encounter (Signed)
Prescription refill request for Xarelto received.   Indication: Aifb Last office visit: 09/14/2019, Meda Coffee Weight: 99.2 kg  Age: 74  Scr:1.23, 11/12/2019 CrCl: 75 ml/min    Prescription refill sent.

## 2020-01-12 DIAGNOSIS — R2689 Other abnormalities of gait and mobility: Secondary | ICD-10-CM | POA: Diagnosis not present

## 2020-01-12 DIAGNOSIS — M25551 Pain in right hip: Secondary | ICD-10-CM | POA: Diagnosis not present

## 2020-01-12 DIAGNOSIS — M5441 Lumbago with sciatica, right side: Secondary | ICD-10-CM | POA: Diagnosis not present

## 2020-01-12 DIAGNOSIS — M6281 Muscle weakness (generalized): Secondary | ICD-10-CM | POA: Diagnosis not present

## 2020-01-14 DIAGNOSIS — R2689 Other abnormalities of gait and mobility: Secondary | ICD-10-CM | POA: Diagnosis not present

## 2020-01-14 DIAGNOSIS — M25551 Pain in right hip: Secondary | ICD-10-CM | POA: Diagnosis not present

## 2020-01-14 DIAGNOSIS — M5441 Lumbago with sciatica, right side: Secondary | ICD-10-CM | POA: Diagnosis not present

## 2020-01-14 DIAGNOSIS — M6281 Muscle weakness (generalized): Secondary | ICD-10-CM | POA: Diagnosis not present

## 2020-01-17 ENCOUNTER — Other Ambulatory Visit: Payer: Self-pay | Admitting: Cardiology

## 2020-01-19 DIAGNOSIS — M5441 Lumbago with sciatica, right side: Secondary | ICD-10-CM | POA: Diagnosis not present

## 2020-01-19 DIAGNOSIS — M6281 Muscle weakness (generalized): Secondary | ICD-10-CM | POA: Diagnosis not present

## 2020-01-19 DIAGNOSIS — R2689 Other abnormalities of gait and mobility: Secondary | ICD-10-CM | POA: Diagnosis not present

## 2020-01-19 DIAGNOSIS — M25551 Pain in right hip: Secondary | ICD-10-CM | POA: Diagnosis not present

## 2020-01-21 DIAGNOSIS — M5441 Lumbago with sciatica, right side: Secondary | ICD-10-CM | POA: Diagnosis not present

## 2020-01-21 DIAGNOSIS — R2689 Other abnormalities of gait and mobility: Secondary | ICD-10-CM | POA: Diagnosis not present

## 2020-01-21 DIAGNOSIS — M6281 Muscle weakness (generalized): Secondary | ICD-10-CM | POA: Diagnosis not present

## 2020-01-21 DIAGNOSIS — M25551 Pain in right hip: Secondary | ICD-10-CM | POA: Diagnosis not present

## 2020-01-26 DIAGNOSIS — M5441 Lumbago with sciatica, right side: Secondary | ICD-10-CM | POA: Diagnosis not present

## 2020-01-26 DIAGNOSIS — M25551 Pain in right hip: Secondary | ICD-10-CM | POA: Diagnosis not present

## 2020-01-26 DIAGNOSIS — R2689 Other abnormalities of gait and mobility: Secondary | ICD-10-CM | POA: Diagnosis not present

## 2020-01-26 DIAGNOSIS — M6281 Muscle weakness (generalized): Secondary | ICD-10-CM | POA: Diagnosis not present

## 2020-01-28 DIAGNOSIS — M25551 Pain in right hip: Secondary | ICD-10-CM | POA: Diagnosis not present

## 2020-01-28 DIAGNOSIS — M5441 Lumbago with sciatica, right side: Secondary | ICD-10-CM | POA: Diagnosis not present

## 2020-01-28 DIAGNOSIS — R2689 Other abnormalities of gait and mobility: Secondary | ICD-10-CM | POA: Diagnosis not present

## 2020-01-28 DIAGNOSIS — M6281 Muscle weakness (generalized): Secondary | ICD-10-CM | POA: Diagnosis not present

## 2020-02-02 DIAGNOSIS — M5441 Lumbago with sciatica, right side: Secondary | ICD-10-CM | POA: Diagnosis not present

## 2020-02-02 DIAGNOSIS — M25551 Pain in right hip: Secondary | ICD-10-CM | POA: Diagnosis not present

## 2020-02-02 DIAGNOSIS — M6281 Muscle weakness (generalized): Secondary | ICD-10-CM | POA: Diagnosis not present

## 2020-02-02 DIAGNOSIS — R2689 Other abnormalities of gait and mobility: Secondary | ICD-10-CM | POA: Diagnosis not present

## 2020-02-04 DIAGNOSIS — M5441 Lumbago with sciatica, right side: Secondary | ICD-10-CM | POA: Diagnosis not present

## 2020-02-04 DIAGNOSIS — M6281 Muscle weakness (generalized): Secondary | ICD-10-CM | POA: Diagnosis not present

## 2020-02-04 DIAGNOSIS — R2689 Other abnormalities of gait and mobility: Secondary | ICD-10-CM | POA: Diagnosis not present

## 2020-02-04 DIAGNOSIS — M25551 Pain in right hip: Secondary | ICD-10-CM | POA: Diagnosis not present

## 2020-02-18 DIAGNOSIS — H9190 Unspecified hearing loss, unspecified ear: Secondary | ICD-10-CM | POA: Diagnosis not present

## 2020-02-18 DIAGNOSIS — N182 Chronic kidney disease, stage 2 (mild): Secondary | ICD-10-CM | POA: Diagnosis not present

## 2020-02-18 DIAGNOSIS — I10 Essential (primary) hypertension: Secondary | ICD-10-CM | POA: Diagnosis not present

## 2020-02-23 DIAGNOSIS — M5441 Lumbago with sciatica, right side: Secondary | ICD-10-CM | POA: Diagnosis not present

## 2020-02-23 DIAGNOSIS — M6281 Muscle weakness (generalized): Secondary | ICD-10-CM | POA: Diagnosis not present

## 2020-02-23 DIAGNOSIS — M25551 Pain in right hip: Secondary | ICD-10-CM | POA: Diagnosis not present

## 2020-02-23 DIAGNOSIS — R2689 Other abnormalities of gait and mobility: Secondary | ICD-10-CM | POA: Diagnosis not present

## 2020-02-25 DIAGNOSIS — M6281 Muscle weakness (generalized): Secondary | ICD-10-CM | POA: Diagnosis not present

## 2020-02-25 DIAGNOSIS — M5441 Lumbago with sciatica, right side: Secondary | ICD-10-CM | POA: Diagnosis not present

## 2020-02-25 DIAGNOSIS — M25551 Pain in right hip: Secondary | ICD-10-CM | POA: Diagnosis not present

## 2020-02-25 DIAGNOSIS — R2689 Other abnormalities of gait and mobility: Secondary | ICD-10-CM | POA: Diagnosis not present

## 2020-03-03 DIAGNOSIS — M25551 Pain in right hip: Secondary | ICD-10-CM | POA: Diagnosis not present

## 2020-03-03 DIAGNOSIS — M5441 Lumbago with sciatica, right side: Secondary | ICD-10-CM | POA: Diagnosis not present

## 2020-03-03 DIAGNOSIS — M6281 Muscle weakness (generalized): Secondary | ICD-10-CM | POA: Diagnosis not present

## 2020-03-03 DIAGNOSIS — R2689 Other abnormalities of gait and mobility: Secondary | ICD-10-CM | POA: Diagnosis not present

## 2020-03-09 ENCOUNTER — Encounter: Payer: Self-pay | Admitting: *Deleted

## 2020-03-09 DIAGNOSIS — R2689 Other abnormalities of gait and mobility: Secondary | ICD-10-CM | POA: Diagnosis not present

## 2020-03-09 DIAGNOSIS — M25551 Pain in right hip: Secondary | ICD-10-CM | POA: Diagnosis not present

## 2020-03-09 DIAGNOSIS — M5441 Lumbago with sciatica, right side: Secondary | ICD-10-CM | POA: Diagnosis not present

## 2020-03-09 DIAGNOSIS — M6281 Muscle weakness (generalized): Secondary | ICD-10-CM | POA: Diagnosis not present

## 2020-03-09 NOTE — Telephone Encounter (Signed)
This encounter was created in error - please disregard.

## 2020-03-15 DIAGNOSIS — H903 Sensorineural hearing loss, bilateral: Secondary | ICD-10-CM | POA: Diagnosis not present

## 2020-03-16 ENCOUNTER — Encounter: Payer: Self-pay | Admitting: Cardiology

## 2020-03-16 ENCOUNTER — Telehealth: Payer: Self-pay | Admitting: *Deleted

## 2020-03-16 ENCOUNTER — Ambulatory Visit: Payer: PPO | Admitting: Cardiology

## 2020-03-16 ENCOUNTER — Other Ambulatory Visit: Payer: Self-pay

## 2020-03-16 VITALS — BP 126/78 | HR 73 | Ht 70.0 in | Wt 222.0 lb

## 2020-03-16 DIAGNOSIS — I2510 Atherosclerotic heart disease of native coronary artery without angina pectoris: Secondary | ICD-10-CM

## 2020-03-16 DIAGNOSIS — I7121 Aneurysm of the ascending aorta, without rupture: Secondary | ICD-10-CM

## 2020-03-16 DIAGNOSIS — I712 Thoracic aortic aneurysm, without rupture: Secondary | ICD-10-CM

## 2020-03-16 DIAGNOSIS — I10 Essential (primary) hypertension: Secondary | ICD-10-CM | POA: Diagnosis not present

## 2020-03-16 DIAGNOSIS — I351 Nonrheumatic aortic (valve) insufficiency: Secondary | ICD-10-CM | POA: Diagnosis not present

## 2020-03-16 NOTE — Telephone Encounter (Signed)
-----   Message from Dorothy Spark, MD sent at 03/16/2020 12:26 PM EST ----- Can you please refer him to the lipid clinic for consideration of PCSK9 inhibitors.  Thank you.

## 2020-03-16 NOTE — Telephone Encounter (Signed)
Pt saw Dr. Meda Coffee in the clinic this morning.  Since Dr. Meda Coffee will be leaving, he is requesting to establish care with Dr. Agustin Cree in our [place] office, being the pt lives there.  Pt is aware that I will route this message to Dr. Agustin Cree and his covering nurse to further review and advise on Provider switch.  He is aware that scheduling will call him back thereafter, to arrange next follow-up appt, after advised on.  Pt will be due for regular follow-up in 6 months, per Dr. Meda Coffee.

## 2020-03-16 NOTE — Telephone Encounter (Signed)
Thank you :)

## 2020-03-16 NOTE — Patient Instructions (Signed)
Medication Instructions:   Your physician recommends that you continue on your current medications as directed. Please refer to the Current Medication list given to you today.  *If you need a refill on your cardiac medications before your next appointment, please call your pharmacy*     Follow-Up:  IN 6 MONTHS WITH DR. Herbie Baltimore KRASOWSKI IN OUR [place] OFFICE.  WE WILL HAVE TO SEND DR. KRASOWSKI AND HIS NURSE A MESSAGE ABOUT REQUESTING TO SWITCH YOUR CARE FROM DR. NELSON TO THEM, AND SCHEDULING WILL REACH OUT TO YOU ACCORDINGLY THEREAFTER, WHEN AGREEMENT IS COMPLETE.

## 2020-03-16 NOTE — Telephone Encounter (Signed)
Called the pt to endorse recommendations per Dr. Meda Coffee, for him to be referred to lipid clinic for consideration of PCSK9-Inhibitors.  Pt states he is tolerating his statin very well, and would prefer staying on this for his lipid management, and if he does experience any side effects with his rosuvastatin, he  will call to let us know, so he can be referred to the lipid clinic at that time. Informed the pt that I would run this by Dr. Meda Coffee, and see if she agrees with this plan. Informed the pt that if she has any further recommendations or advisement to provide, I will follow-up with him shortly thereafter.  Pt verbalized understanding and agrees with this plan.

## 2020-03-16 NOTE — Progress Notes (Signed)
Cardiology Office Note    Date:  03/16/2020   ID:  CARVEL HUSKINS, DOB 08/10/1945, MRN [id number]  PCP:  [name], MD  Cardiologist: [name], MD EPS: None  Reason for visit: 6 months follow-up  History of Present Illness:  Aaron Richmond is a 75 y.o. male with history of persistent atrial fibrillation CHA2DS2-VASc score equals 3 on Xarelto, dilated asending aortic aneurysm 4.9 cm on CTA 03/2017 followed yearly by Dr. [name] who does not want operate unless diameter reaches 55 mm or there is another indication.  2D echo 02/2017 LVEF 50 to 55% with moderate aortic regurgitation.  Also has extensive coronary calcification on CT scan and positive family history of early CAD per patient has had no angina.  Also has hypertension, HLD. History of Pulmonary embolus after a long plane ride about 2009.  The patient is coming after 6 months, he has been doing great, he continues to work part-time job in [employer], he denies any chest pain, dyspnea on exertion, no lower extremity edema orthopnea proximal nocturnal dyspnea.  He used to follow with Dr. [name] prior to his retirement now will follow with Dr. Orvan Seen for his ascending aortic aneurysm next appointment on April 27, 2020.  He has been compliant with his meds.  Past Medical History:  Diagnosis Date  . Aortic regurgitation   . Aortic root aneurysm ([place])   . Atrial fibrillation ([place])   . BPH (benign prostatic hyperplasia)   . Hyperlipidemia   . Hypertension   . Obesity   . Pulmonary embolism ([place])   . Rhinitis   . Sleep apnea    CPAP    Past Surgical History:  Procedure Laterality Date  . APPENDECTOMY    . HERNIA REPAIR    . TONSILLECTOMY     Current Medications: Current Meds  Medication Sig  . acetaminophen (TYLENOL) 500 MG tablet Take 2,000 mg by mouth daily as needed.   Marland Kitchen b complex vitamins tablet Take 1 tablet by mouth daily.  . Chelated Zinc 50 MG TABS Take 1 tablet by mouth daily.  Marland Kitchen FARXIGA 5 MG  TABS tablet Take 5 mg by mouth every morning.  . finasteride (PROSCAR) 5 MG tablet Take 5 mg by mouth daily.  . fish oil-omega-3 fatty acids 1000 MG capsule Take 1,200 g by mouth daily.  . folic acid (FOLVITE) 287 MCG tablet Take 800 mcg by mouth daily.  . hydrochlorothiazide (HYDRODIURIL) 25 MG tablet Take 1 tablet by mouth once daily  . levocetirizine (XYZAL) 5 MG tablet Take 5 mg by mouth every evening.  Marland Kitchen losartan (COZAAR) 25 MG tablet Take 1 tablet (25 mg total) by mouth daily.  . meloxicam (MOBIC) 7.5 MG tablet Take 7.5 mg by mouth in the morning and at bedtime.   . metoprolol tartrate (LOPRESSOR) 25 MG tablet Take 1 tablet by mouth twice daily  . Multiple Vitamins-Minerals (MENS MULTIVITAMIN PLUS PO) Take 1 tablet by mouth daily.  . Nettle, Urtica Dioica, (NETTLE LEAF PO) Take by mouth.  . Potassium 99 MG TABS Take 1 tablet by mouth daily.  . ranitidine (ZANTAC) 75 MG tablet Take 75 mg by mouth 2 (two) times daily.  . rosuvastatin (CRESTOR) 5 MG tablet TAKE 1 TABLET BY MOUTH 3 TIMES A WEEK AND INCREASE AS TOLERATED.  Marland Kitchen tamsulosin (FLOMAX) 0.4 MG CAPS capsule Take 0.4 mg by mouth daily after supper.  Marland Kitchen TART CHERRY PO Take by mouth.  Marland Kitchen UNABLE TO FIND Take 2 tablets by  mouth daily. Med Name:Curamin  . XARELTO 20 MG TABS tablet TAKE 1 TABLET BY MOUTH ONCE DAILY WITH SUPPER     Allergies:   Quinolones and Statins   Social History   Socioeconomic History  . Marital status: Married    Spouse name: Not on file  . Number of children: 0  . Years of education: Not on file  . Highest education level: Not on file  Occupational History  . Occupation: Engineering geologist  Tobacco Use  . Smoking status: Never Smoker  . Smokeless tobacco: Never Used  Vaping Use  . Vaping Use: Never used  Substance and Sexual Activity  . Alcohol use: No  . Drug use: No  . Sexual activity: Not on file  Other Topics Concern  . Not on file  Social History Narrative  . Not on file   Social Determinants of  Health   Financial Resource Strain: Not on file  Food Insecurity: Not on file  Transportation Needs: Not on file  Physical Activity: Not on file  Stress: Not on file  Social Connections: Not on file    Family History:  The patient's family history includes Alzheimer's disease in his mother; Diabetes Mellitus II in his father; Heart attack in his mother; Stroke in his father and mother.   ROS:   Please see the history of present illness.    Review of Systems  Constitutional: Negative.  HENT: Negative.   Cardiovascular: Negative.   Respiratory: Negative.   Endocrine: Negative.   Hematologic/Lymphatic: Negative.   Musculoskeletal: Positive for arthritis and joint pain.  Gastrointestinal: Negative.   Genitourinary: Negative.   Neurological: Negative.    All other systems reviewed and are negative.  PHYSICAL EXAM:   VS:  BP 126/78   Pulse 73   Ht 5\' 10"  (1.778 m)   Wt 222 lb (100.7 kg)   SpO2 94%   BMI 31.85 kg/m   Physical Exam  GEN: Well nourished, well developed, in no acute distress  Neck: no JVD, carotid bruits, or masses Cardiac:irreg irreg; 5 out of 6 diastolic murmur, rubs, or gallops  Respiratory:  clear to auscultation bilaterally, normal work of breathing GI: soft, nontender, nondistended, + BS Ext: without cyanosis, clubbing, or edema, Good distal pulses bilaterally Neuro:  Alert and Oriented x 3 Psych: euthymic mood, full affect  Wt Readings from Last 3 Encounters:  03/16/20 222 lb (100.7 kg)  09/14/19 218 lb 12.8 oz (99.2 kg)  08/19/19 220 lb (99.8 kg)    Studies/Labs Reviewed:   EKG:  EKG is  ordered today.  It shows atrial fibrillation with PVC, unchanged from prior.  This was personally reviewed.  Recent Labs: No results found for requested labs within last 8760 hours.   Lipid Panel    Component Value Date/Time   CHOL 132 11/13/2018 0924   TRIG 132 11/13/2018 0924   HDL 34 (L) 11/13/2018 0924   CHOLHDL 3.9 11/13/2018 0924   LDLCALC 74  11/13/2018 0924   Additional studies/ records that were reviewed today include:   TTE 09/04/2018    1. The left ventricle has low normal systolic function, with an ejection fraction of 50-55%. The cavity size was severely dilated. Left ventricular diastolic function could not be evaluated secondary to atrial fibrillation. No evidence of left  ventricular regional wall motion abnormalities.  2. The right ventricle has normal systolic function. The cavity was mildly enlarged. There is no increase in right ventricular wall thickness. Right ventricular systolic pressure is normal.  3. Left atrial size was severely dilated.  4. There is moderate dilatation of the sinus of Valsalva at 39mm, 30mm at the sinotubular junction and 76mm in the ascending aorta.  5. Mitral valve regurgitation is mild to moderate by color flow    Chest CTA 08/06/2018  1. Technically adequate exam showing no evidence for acute abnormality. 2. Stable appearance of ascending aortic aneurysm, measuring 4.8 cm. Recommend semi-annual imaging followup by CTA or MRA and referral to cardiothoracic surgery if not already obtained. This recommendation follows 2010 ACCF/AHA/AATS/ACR/ASA/SCA/SCAI/SIR/STS/SVM Guidelines for the Diagnosis and Management of Patients With Thoracic Aortic Disease. Circulation. 2010; 121: S063-K16. Aortic aneurysm NOS (ICD10-I71.9) 3. Calcified pleural calcifications consistent with granulomatous disease. 4. Aortic Atherosclerosis (ICD10-I70.0).  Chest CTA 08/16/2019  1. Stable ascending thoracic aortic prominence with ascending thoracic aortic diameter measuring 4.8 x 4.8 cm. Other measurements as described. No dissection evident. Foci of aortic atherosclerosis as well as great vessel and coronary artery calcification noted. Ascending thoracic aortic aneurysm. Recommend semi-annual imaging followup by CTA or MRA and referral to cardiothoracic surgery if not already obtained. This recommendation  follows 2010 ACCF/AHA/AATS/ACR/ASA/SCA/SCAI/SIR/STS/SVM Guidelines for the Diagnosis and Management of Patients With Thoracic Aortic Disease. Circulation. 2010; 121: W109-N235. Aortic aneurysm NOS (ICD10-I71.9). 2.  No demonstrable pulmonary embolus. 3. Areas of atelectatic change bilaterally. No edema or airspace opacity. No pleural effusions. 4. Stable mildly prominent precarinal lymph node on the right. No other lymph node prominence evident.   ASSESSMENT:    1. Ascending aortic aneurysm ([place])   2. Primary hypertension   3. Aortic valve insufficiency, etiology of cardiac valve disease unspecified    PLAN:   In order of problems listed above:  Ascending aortic aneurysm -stable at 48 mm on CTA in August 2021, followed by Dr. [name], now transferred to Dr. Orvan Seen, next appointment on 04/27/2020.  Neck CTA should be scheduled for August 2022.  Persistent atrial fibrillation, rate controlled, chads vas score equals 3 on Xarelto.  No bleeding.  Aortic valve insufficiency moderate on 2D echo in September 2021, however LV dilatation, he might be coming to the point of surgery soon.   If he requires AVR he will also have ascending root replacement as well.  Essential hypertension -controlled on current regimen  History of pulmonary embolism after a long flight in 2009.  On Xarelto.  Coronary artery calcification seen on CT scan and strong family history of early CAD but is asymptomatic without chest pain  Hyperlipidemia -he has not been tolerating multiple statin, arise tried low-dose of rosuvastatin, currently on omega-3 acids, with most recent lipid panel in November 2021 HDL 38, LDL 92 and triglycerides 152.  He should be referred to the lipid clinic for consideration of PCSK9 inhibitors.  Medication Adjustments/Labs and Tests Ordered: Current medicines are reviewed at length with the patient today.  Concerns regarding medicines are outlined above.  Medication changes, Labs and  Tests ordered today are listed in the Patient Instructions below. Patient Instructions  Medication Instructions:   Your physician recommends that you continue on your current medications as directed. Please refer to the Current Medication list given to you today.  *If you need a refill on your cardiac medications before your next appointment, please call your pharmacy*     Follow-Up:  IN 6 MONTHS WITH DR. Herbie Baltimore KRASOWSKI IN OUR [place] OFFICE.  WE WILL HAVE TO SEND DR. KRASOWSKI AND HIS NURSE A MESSAGE ABOUT REQUESTING TO SWITCH YOUR CARE FROM DR. Vernon Ariel TO THEM, AND SCHEDULING WILL REACH  OUT TO YOU ACCORDINGLY THEREAFTER, WHEN AGREEMENT IS COMPLETE.        Signed, [name], MD  03/16/2020 12:25 PM    [address], [place],   [address] Phone: [phone]; Fax: [phone]

## 2020-03-21 NOTE — Telephone Encounter (Signed)
Recall for 6 months placed for this pt to see Dr. Agustin Cree in our [facility] office.  MD switch agreement provided in this encounter.

## 2020-03-21 NOTE — Telephone Encounter (Signed)
No problem.

## 2020-03-30 ENCOUNTER — Other Ambulatory Visit: Payer: Self-pay | Admitting: *Deleted

## 2020-03-30 DIAGNOSIS — I7121 Aneurysm of the ascending aorta, without rupture: Secondary | ICD-10-CM

## 2020-03-30 DIAGNOSIS — I712 Thoracic aortic aneurysm, without rupture: Secondary | ICD-10-CM

## 2020-04-04 ENCOUNTER — Other Ambulatory Visit: Payer: Self-pay | Admitting: *Deleted

## 2020-04-04 DIAGNOSIS — I712 Thoracic aortic aneurysm, without rupture: Secondary | ICD-10-CM

## 2020-04-04 DIAGNOSIS — I7121 Aneurysm of the ascending aorta, without rupture: Secondary | ICD-10-CM

## 2020-04-12 DIAGNOSIS — N400 Enlarged prostate without lower urinary tract symptoms: Secondary | ICD-10-CM | POA: Diagnosis not present

## 2020-04-12 DIAGNOSIS — I4891 Unspecified atrial fibrillation: Secondary | ICD-10-CM | POA: Diagnosis not present

## 2020-04-27 ENCOUNTER — Ambulatory Visit: Payer: PPO | Admitting: Cardiothoracic Surgery

## 2020-04-27 ENCOUNTER — Other Ambulatory Visit: Payer: Self-pay

## 2020-04-27 ENCOUNTER — Ambulatory Visit
Admission: RE | Admit: 2020-04-27 | Discharge: 2020-04-27 | Disposition: A | Discharge status: Home / Self Care | Payer: PPO | Source: Ambulatory Visit | Attending: Cardiothoracic Surgery | Admitting: Cardiothoracic Surgery

## 2020-04-27 ENCOUNTER — Telehealth: Payer: Self-pay

## 2020-04-27 ENCOUNTER — Encounter: Payer: Self-pay | Admitting: Cardiothoracic Surgery

## 2020-04-27 VITALS — BP 132/68 | HR 91 | Temp 98.4°F | Resp 20 | Ht 70.0 in | Wt 214.0 lb

## 2020-04-27 DIAGNOSIS — I7121 Aneurysm of the ascending aorta, without rupture: Secondary | ICD-10-CM

## 2020-04-27 DIAGNOSIS — I2510 Atherosclerotic heart disease of native coronary artery without angina pectoris: Secondary | ICD-10-CM | POA: Diagnosis not present

## 2020-04-27 DIAGNOSIS — M19012 Primary osteoarthritis, left shoulder: Secondary | ICD-10-CM | POA: Diagnosis not present

## 2020-04-27 DIAGNOSIS — J9811 Atelectasis: Secondary | ICD-10-CM | POA: Diagnosis not present

## 2020-04-27 DIAGNOSIS — I712 Thoracic aortic aneurysm, without rupture: Secondary | ICD-10-CM

## 2020-04-27 DIAGNOSIS — I719 Aortic aneurysm of unspecified site, without rupture: Secondary | ICD-10-CM | POA: Diagnosis not present

## 2020-04-27 MED ORDER — IOPAMIDOL (ISOVUE-300) INJECTION 61%
75.0000 mL | Freq: Once | INTRAVENOUS | Status: AC | PRN
  Administered 2020-04-27: 75 mL via INTRAVENOUS

## 2020-04-27 NOTE — Telephone Encounter (Signed)
[facility] contacted the office with a call report for patient's CTA chest done today.  Patient seen in the office for TAA evaluation.  Dr. Orvan Seen aware of results.

## 2020-05-04 NOTE — Progress Notes (Signed)
[address]       [place] [address]             [phone]     CARDIOTHORACIC SURGERY OFFICE NOTE  Referring Provider is Nona Dell, Corene Cornea, MD Primary Cardiologist is [name], MD PCP is Nona Dell, Corene Cornea, MD   HPI:  75 year old man presents for surveillance of an ascending aortic aneurysm.  He had been previously seen by Dr. [name].  He underwent an echocardiogram in September 2021 which demonstrated mild to moderate aortic valve insufficiency and an aneurysm at the level of the aortic root measuring approximately 5 cm in diameter.  He is also had a history of atrial fibrillation but is not currently anticoagulated.  He was started on losartan last fall and has been doing better.  Today he has no symptoms.   Current Outpatient Medications  Medication Sig Dispense Refill  . acetaminophen (TYLENOL) 500 MG tablet Take 2,000 mg by mouth daily as needed.     Marland Kitchen b complex vitamins tablet Take 1 tablet by mouth daily.    . Chelated Zinc 50 MG TABS Take 1 tablet by mouth daily.    Marland Kitchen FARXIGA 5 MG TABS tablet Take 5 mg by mouth every morning.    . finasteride (PROSCAR) 5 MG tablet Take 5 mg by mouth daily.    . fish oil-omega-3 fatty acids 1000 MG capsule Take 1,200 g by mouth daily.    . folic acid (FOLVITE) 903 MCG tablet Take 800 mcg by mouth daily.    . hydrochlorothiazide (HYDRODIURIL) 25 MG tablet Take 1 tablet by mouth once daily 90 tablet 2  . levocetirizine (XYZAL) 5 MG tablet Take 5 mg by mouth every evening.    Marland Kitchen losartan (COZAAR) 25 MG tablet Take 1 tablet (25 mg total) by mouth daily. 90 tablet 3  . meloxicam (MOBIC) 7.5 MG tablet Take 7.5 mg by mouth in the morning and at bedtime.     . metoprolol tartrate (LOPRESSOR) 25 MG tablet Take 1 tablet by mouth twice daily 180 tablet 3  . Multiple Vitamins-Minerals (MENS MULTIVITAMIN PLUS PO) Take 1 tablet by mouth daily.    . Nettle, Urtica Dioica, (NETTLE LEAF PO) Take by mouth.    . Potassium 99 MG TABS  Take 1 tablet by mouth daily.    . ranitidine (ZANTAC) 75 MG tablet Take 75 mg by mouth 2 (two) times daily.    . rosuvastatin (CRESTOR) 5 MG tablet TAKE 1 TABLET BY MOUTH 3 TIMES A WEEK AND INCREASE AS TOLERATED. 45 tablet 3  . tamsulosin (FLOMAX) 0.4 MG CAPS capsule Take 0.4 mg by mouth daily after supper.    Marland Kitchen TART CHERRY PO Take by mouth.    Marland Kitchen UNABLE TO FIND Take 2 tablets by mouth daily. Med Name:Curamin    . XARELTO 20 MG TABS tablet TAKE 1 TABLET BY MOUTH ONCE DAILY WITH SUPPER 90 tablet 1   No current facility-administered medications for this visit.      Physical Exam:   BP 132/68 (BP Location: Left Arm, Patient Position: Sitting, Cuff Size: Large)   Pulse 91   Temp 98.4 F (36.9 C) (Skin)   Resp 20   Ht 5\' 10"  (1.778 m)   Wt 97.1 kg   SpO2 93% Comment: RA  BMI 30.71 kg/m   General:  Well-appearing no acute distress  Chest:   Clear to auscultation  CV:   Regular rate and rhythm with no detectable  murmur  Abdomen:  Soft nontender  Extremities:  No edema Diagnostic Tests:  I personally reviewed his available imaging studies including CT chest from 04/27/2020 which demonstrated stable 4.8 cm ascending aortic aneurysm located closer to the root.  The report of the study also notes a 2.3 cm osseous sclerosis at L1 vertebral body suggestive of metastatic disease which is apparently new as well as an additional lesion in the T4 spinous process    Impression:  75 year old man with a stable ascending aortic aneurysm  Plan:  Follow-up in 1 year with repeat CT scan of the chest to assess aneurysm Refer to oncology for evaluation of bony lytic lesion of uncertain etiology and consequence  I spent in excess of 15 minutes during the conduct of this office consultation and >50% of this time involved direct face-to-face encounter with the patient for counseling and/or coordination of their care.  Level 2                 10 minutes Level 3                 15 minutes Level 4                  25 minutes Level 5                 40 minutes  B.  Murvin Natal, MD 05/04/2020 1:51 PM

## 2020-05-05 ENCOUNTER — Other Ambulatory Visit: Payer: Self-pay | Admitting: Cardiothoracic Surgery

## 2020-05-05 DIAGNOSIS — R9389 Abnormal findings on diagnostic imaging of other specified body structures: Secondary | ICD-10-CM

## 2020-05-19 DIAGNOSIS — R2681 Unsteadiness on feet: Secondary | ICD-10-CM | POA: Diagnosis not present

## 2020-05-19 DIAGNOSIS — M4316 Spondylolisthesis, lumbar region: Secondary | ICD-10-CM | POA: Diagnosis not present

## 2020-05-22 ENCOUNTER — Ambulatory Visit (HOSPITAL_COMMUNITY)
Admission: RE | Admit: 2020-05-22 | Discharge: 2020-05-22 | Disposition: A | Discharge status: Home / Self Care | Payer: PPO | Source: Ambulatory Visit | Attending: Cardiothoracic Surgery | Admitting: Cardiothoracic Surgery

## 2020-05-22 ENCOUNTER — Other Ambulatory Visit: Payer: Self-pay

## 2020-05-22 DIAGNOSIS — K5730 Diverticulosis of large intestine without perforation or abscess without bleeding: Secondary | ICD-10-CM | POA: Diagnosis not present

## 2020-05-22 DIAGNOSIS — I700 Atherosclerosis of aorta: Secondary | ICD-10-CM | POA: Diagnosis not present

## 2020-05-22 DIAGNOSIS — R9389 Abnormal findings on diagnostic imaging of other specified body structures: Secondary | ICD-10-CM | POA: Insufficient documentation

## 2020-05-22 DIAGNOSIS — K760 Fatty (change of) liver, not elsewhere classified: Secondary | ICD-10-CM | POA: Insufficient documentation

## 2020-05-22 DIAGNOSIS — E279 Disorder of adrenal gland, unspecified: Secondary | ICD-10-CM | POA: Diagnosis not present

## 2020-05-22 DIAGNOSIS — I77810 Thoracic aortic ectasia: Secondary | ICD-10-CM | POA: Insufficient documentation

## 2020-05-22 LAB — GLUCOSE, CAPILLARY: Glucose-Capillary: 122 mg/dL — ABNORMAL HIGH (ref 70–99)

## 2020-05-22 MED ORDER — FLUDEOXYGLUCOSE F - 18 (FDG) INJECTION
11.1000 | Freq: Once | INTRAVENOUS | Status: AC | PRN
  Administered 2020-05-22: 10.6 via INTRAVENOUS

## 2020-05-23 ENCOUNTER — Telehealth: Payer: Self-pay | Admitting: *Deleted

## 2020-05-23 NOTE — Telephone Encounter (Signed)
Patient made aware of PET results from 05/22/20.  I told him this was reviewed by Dr. Orvan Seen and we plan to refer him to [facility] for a possible prostate biopsy.  All office notes, CT & PET reports have been faxed today.  He understands and had no further questions.  I told him to call me if he needed anything else.

## 2020-05-26 DIAGNOSIS — M5416 Radiculopathy, lumbar region: Secondary | ICD-10-CM | POA: Diagnosis not present

## 2020-05-26 DIAGNOSIS — M4316 Spondylolisthesis, lumbar region: Secondary | ICD-10-CM | POA: Diagnosis not present

## 2020-06-07 DIAGNOSIS — N400 Enlarged prostate without lower urinary tract symptoms: Secondary | ICD-10-CM | POA: Diagnosis not present

## 2020-06-07 DIAGNOSIS — I10 Essential (primary) hypertension: Secondary | ICD-10-CM | POA: Diagnosis not present

## 2020-06-07 DIAGNOSIS — N1831 Chronic kidney disease, stage 3a: Secondary | ICD-10-CM | POA: Diagnosis not present

## 2020-06-07 DIAGNOSIS — M899 Disorder of bone, unspecified: Secondary | ICD-10-CM | POA: Diagnosis not present

## 2020-06-08 DIAGNOSIS — Z125 Encounter for screening for malignant neoplasm of prostate: Secondary | ICD-10-CM | POA: Diagnosis not present

## 2020-06-08 DIAGNOSIS — C7951 Secondary malignant neoplasm of bone: Secondary | ICD-10-CM | POA: Diagnosis not present

## 2020-06-08 DIAGNOSIS — N402 Nodular prostate without lower urinary tract symptoms: Secondary | ICD-10-CM | POA: Diagnosis not present

## 2020-06-09 ENCOUNTER — Telehealth: Payer: Self-pay | Admitting: *Deleted

## 2020-06-09 NOTE — Telephone Encounter (Signed)
   [facility] Pre-operative Risk Assessment    Patient Name: Aaron Richmond  DOB: 06-Jul-1945  MRN: [id number]   [place]: - Please ensure there is not already an duplicate clearance open for this procedure. - Under Visit Info/Reason for Call, type in Other and utilize the format Clearance MM/DD/YY or Clearance TBD. Do not use dashes or single digits. - If request is for dental extraction, please clarify the # of teeth to be extracted. - If the patient is currently at the dentist's office, call Pre-Op APP to address. If the patient is not currently in the dentist office, please route to the Pre-Op pool  Request for surgical clearance:  1. What type of surgery is being performed? PROSTATE Bx    2. When is this surgery scheduled? TBD   3. What type of clearance is required (medical clearance vs. Pharmacy clearance to hold med vs. Both)? BOTH  4. Are there any medications that need to be held prior to surgery and how long? XARELTO x 3 DAYS PRIOR TO PROCEDURE   5. Practice name and name of physician performing surgery? [facility]; DR. Cornelia Copa BELL  6. What is the office phone number? [phone]   7.   What is the office fax number? [phone]  8.   Anesthesia type (None, local, MAC, general) ? NONE LISTED   Julaine Hua 06/09/2020, 1:23 PM  _________________________________________________________________   (provider comments below)

## 2020-06-09 NOTE — Telephone Encounter (Signed)
Patient with diagnosis of ATRIAL FIBRILLATION on [place] for anticoagulation.    Procedure: PROSTATE BIOPSY Date of procedure: TBD  *NOTED HX OF RECURRENT PE - NO WORKUP FOR HYPERCOAGULABLE STATE NOTED*  CHA2DS2-VASc Score = 4  This indicates a 4.8% annual risk of stroke. The patient's score is based upon: CHF History: No HTN History: Yes Diabetes History: Yes Stroke History: No Vascular Disease History: Yes Age Score: 1 Gender Score: 0   CrCl = 84ML/MI  Per office protocol, patient can hold XARELTO for 2 days prior to procedure.  WILL RECOMMEND BRIDGING WITH LOVENOX  IF NEED TO HOLD anticoagulation for more then 2 days d/t history of PE x 2.  Will forward to MD for final recommendation.

## 2020-06-16 ENCOUNTER — Other Ambulatory Visit: Payer: Self-pay

## 2020-06-16 MED ORDER — RIVAROXABAN 20 MG PO TABS: 20.0000 mg | ORAL_TABLET | Freq: Every day | ORAL | 1 refills | Status: DC

## 2020-06-16 NOTE — Telephone Encounter (Signed)
Pt last saw Dr Meda Coffee 03/16/20, last labs 11/12/19 Creat 1.23 at [facility] per [place], age 75, weight 97.1kg, CrCl 73.46, based on specified criteria pt is on appropriate dosage of Xarelto 20mg  QD.  Will refill rx.

## 2020-06-19 NOTE — Telephone Encounter (Signed)
Sent to cardiologist again. Alert RN to expedite response if possible

## 2020-06-20 NOTE — Telephone Encounter (Signed)
    Aaron Richmond DOB:  1946-01-02  MRN:  [id number]   Primary Cardiologist: Dr. Agustin Cree, MD   Chart reviewed as part of pre-operative protocol coverage. Aaron Richmond would be at a higher CV risk for the planned procedure given hx of recurrent PE anf atrial fibrillation.   Patient with diagnosis of ATRIAL FIBRILLATION on [place] for anticoagulation.     Procedure: PROSTATE BIOPSY Date of procedure: TBD   *NOTED HX OF RECURRENT PE - NO WORKUP FOR HYPERCOAGULABLE STATE NOTED*   CHA2DS2-VASc Score = 4  This indicates a 4.8% annual risk of stroke. The patient's score is based upon: CHF History: No HTN History: Yes Diabetes History: Yes Stroke History: No Vascular Disease History: Yes Age Score: 1 Gender Score: 0   CrCl = 84ML/MI   Per office protocol, patient can hold XARELTO for 2 days prior to procedure.  WILL RECOMMEND BRIDGING WITH LOVENOX  IF NEED TO HOLD anticoagulation for more then 2 days d/t history of PE x 2. Case reviewed with Dr. Agustin Cree who also recommends only a 2 day hold given the above.    I will route this recommendation to the requesting party via Epic fax function and remove from pre-op pool.  Please call with questions.  Kathyrn Drown, NP 06/20/2020, 9:57 AM

## 2020-06-23 DIAGNOSIS — I10 Essential (primary) hypertension: Secondary | ICD-10-CM | POA: Diagnosis not present

## 2020-06-23 DIAGNOSIS — N1831 Chronic kidney disease, stage 3a: Secondary | ICD-10-CM | POA: Diagnosis not present

## 2020-06-23 DIAGNOSIS — M899 Disorder of bone, unspecified: Secondary | ICD-10-CM | POA: Diagnosis not present

## 2020-06-23 NOTE — Telephone Encounter (Signed)
I have called the patient and has given recommendations.

## 2020-06-23 NOTE — Telephone Encounter (Signed)
Pt called back in today as he stated he s/w someone earlier about his pre op instructions but did not understand the person speaking very well. I s/w the pt and went over the instructions he will hold his Xarelto x 2 days prior to surgery/procedure and will resume when surgeon feels safe. I assured the pt generally pt's will resume blood thinners either same day of surgery or at the very least with-in 24 hour s/p surgery. Pt thanked me for the help today and gave verbal understanding to instructions on Xarelto. I also confirmed these instructions with Pharm-D Megan.

## 2020-06-23 NOTE — Telephone Encounter (Signed)
Patient called in to say no one called him to notify him of what going to take place. He stated that he heard from the other office he supposed take take a mediation but start taking something else. Please advise

## 2020-06-28 DIAGNOSIS — C61 Malignant neoplasm of prostate: Secondary | ICD-10-CM | POA: Diagnosis not present

## 2020-06-28 DIAGNOSIS — N402 Nodular prostate without lower urinary tract symptoms: Secondary | ICD-10-CM | POA: Diagnosis not present

## 2020-07-07 DIAGNOSIS — C61 Malignant neoplasm of prostate: Secondary | ICD-10-CM | POA: Diagnosis not present

## 2020-07-07 DIAGNOSIS — C7951 Secondary malignant neoplasm of bone: Secondary | ICD-10-CM | POA: Diagnosis not present

## 2020-07-13 ENCOUNTER — Other Ambulatory Visit (HOSPITAL_COMMUNITY): Payer: Self-pay | Admitting: Urology

## 2020-07-13 DIAGNOSIS — C61 Malignant neoplasm of prostate: Secondary | ICD-10-CM

## 2020-07-13 DIAGNOSIS — C7951 Secondary malignant neoplasm of bone: Secondary | ICD-10-CM

## 2020-08-02 DIAGNOSIS — C61 Malignant neoplasm of prostate: Secondary | ICD-10-CM | POA: Diagnosis not present

## 2020-08-09 DIAGNOSIS — C7951 Secondary malignant neoplasm of bone: Secondary | ICD-10-CM | POA: Diagnosis not present

## 2020-08-09 DIAGNOSIS — C61 Malignant neoplasm of prostate: Secondary | ICD-10-CM | POA: Diagnosis not present

## 2020-08-15 ENCOUNTER — Ambulatory Visit (HOSPITAL_COMMUNITY)
Admission: RE | Admit: 2020-08-15 | Discharge: 2020-08-15 | Disposition: A | Discharge status: Home / Self Care | Payer: PPO | Source: Ambulatory Visit | Attending: Urology | Admitting: Urology

## 2020-08-15 ENCOUNTER — Other Ambulatory Visit: Payer: Self-pay

## 2020-08-15 DIAGNOSIS — C61 Malignant neoplasm of prostate: Secondary | ICD-10-CM | POA: Insufficient documentation

## 2020-08-15 DIAGNOSIS — C7951 Secondary malignant neoplasm of bone: Secondary | ICD-10-CM | POA: Insufficient documentation

## 2020-08-15 MED ORDER — PIFLIFOLASTAT F 18 (PYLARIFY) INJECTION
9.0000 | Freq: Once | INTRAVENOUS | Status: AC
  Administered 2020-08-15: 9.49 via INTRAVENOUS

## 2020-09-06 DIAGNOSIS — Z5111 Encounter for antineoplastic chemotherapy: Secondary | ICD-10-CM | POA: Diagnosis not present

## 2020-09-06 DIAGNOSIS — C61 Malignant neoplasm of prostate: Secondary | ICD-10-CM | POA: Diagnosis not present

## 2020-09-13 DIAGNOSIS — C61 Malignant neoplasm of prostate: Secondary | ICD-10-CM | POA: Diagnosis not present

## 2020-09-20 DIAGNOSIS — C775 Secondary and unspecified malignant neoplasm of intrapelvic lymph nodes: Secondary | ICD-10-CM | POA: Diagnosis not present

## 2020-09-20 DIAGNOSIS — C61 Malignant neoplasm of prostate: Secondary | ICD-10-CM | POA: Diagnosis not present

## 2020-09-20 DIAGNOSIS — C7951 Secondary malignant neoplasm of bone: Secondary | ICD-10-CM | POA: Diagnosis not present

## 2020-10-03 NOTE — Progress Notes (Signed)
GU Location of Tumor / Histology: Prostate Ca  If Prostate Cancer, Gleason Score is (4+4) and PSA is (0.065 as of 9/22, 3.73 as of 6/22)  Biopsies: Dr. Gloriann Loan     Past/Anticipated interventions by urology, if any:   Past/Anticipated interventions by medical oncology, if any:   Weight changes, if any: No stable  SHIM:  7 IPSS: 15  Bowel/Bladder complaints, if any:  No bowel or bladder issues at this time.  Nausea/Vomiting, if any: No  Pain issues, if any:  0/10 score  SAFETY ISSUES: Prior radiation? No Pacemaker/ICD? No Possible current pregnancy? Male Is the patient on methotrexate? No  Current Complaints / other details: None at this time.

## 2020-10-04 ENCOUNTER — Ambulatory Visit
Admission: RE | Admit: 2020-10-04 | Discharge: 2020-10-04 | Disposition: A | Discharge status: Home / Self Care | Payer: PPO | Source: Ambulatory Visit | Attending: Radiation Oncology | Admitting: Radiation Oncology

## 2020-10-04 ENCOUNTER — Encounter: Payer: Self-pay | Admitting: Radiation Oncology

## 2020-10-04 ENCOUNTER — Other Ambulatory Visit: Payer: Self-pay

## 2020-10-04 VITALS — BP 126/65 | HR 97 | Temp 97.3°F | Resp 18 | Wt 219.8 lb

## 2020-10-04 DIAGNOSIS — E669 Obesity, unspecified: Secondary | ICD-10-CM | POA: Insufficient documentation

## 2020-10-04 DIAGNOSIS — N400 Enlarged prostate without lower urinary tract symptoms: Secondary | ICD-10-CM | POA: Diagnosis not present

## 2020-10-04 DIAGNOSIS — Z7901 Long term (current) use of anticoagulants: Secondary | ICD-10-CM | POA: Insufficient documentation

## 2020-10-04 DIAGNOSIS — C61 Malignant neoplasm of prostate: Secondary | ICD-10-CM

## 2020-10-04 DIAGNOSIS — G4730 Sleep apnea, unspecified: Secondary | ICD-10-CM | POA: Insufficient documentation

## 2020-10-04 DIAGNOSIS — I351 Nonrheumatic aortic (valve) insufficiency: Secondary | ICD-10-CM | POA: Diagnosis not present

## 2020-10-04 DIAGNOSIS — E785 Hyperlipidemia, unspecified: Secondary | ICD-10-CM | POA: Insufficient documentation

## 2020-10-04 DIAGNOSIS — Z79899 Other long term (current) drug therapy: Secondary | ICD-10-CM | POA: Diagnosis not present

## 2020-10-04 DIAGNOSIS — Z86711 Personal history of pulmonary embolism: Secondary | ICD-10-CM | POA: Insufficient documentation

## 2020-10-04 DIAGNOSIS — Z7984 Long term (current) use of oral hypoglycemic drugs: Secondary | ICD-10-CM | POA: Insufficient documentation

## 2020-10-04 DIAGNOSIS — I10 Essential (primary) hypertension: Secondary | ICD-10-CM | POA: Diagnosis not present

## 2020-10-04 DIAGNOSIS — C7951 Secondary malignant neoplasm of bone: Secondary | ICD-10-CM | POA: Insufficient documentation

## 2020-10-04 DIAGNOSIS — I4891 Unspecified atrial fibrillation: Secondary | ICD-10-CM | POA: Insufficient documentation

## 2020-10-04 HISTORY — DX: Secondary malignant neoplasm of bone: C61

## 2020-10-04 NOTE — Progress Notes (Signed)
Radiation Oncology         (336) [phone] ________________________________  Initial Outpatient Consultation  Name: Aaron Richmond MRN: [id number]  Date: 10/04/2020  DOB: 27-Dec-1945  CC:Van Wendie Chess, MD  Festus Aloe, MD   REFERRING PHYSICIAN: Festus Aloe, MD  DIAGNOSIS: 75 y.o. gentleman with Stage T2c, metastatic, castrate sensitive adenocarcinoma of the prostate with Gleason score of 4+5, and PSA of 3.73 with involvement of the pelvic lymph nodes and bony skeleton.    ICD-10-CM   1. Malignant neoplasm of prostate ([place])  C61       HISTORY OF PRESENT ILLNESS: Aaron Richmond is a 75 y.o. male with a diagnosis of metastatic, castrate sensitive prostate cancer incidentally found on CT Chest angio imaging for monitoring his thoracic aortic aneurysm in April 2022.  A PET scan was performed on 05/22/2020 for further evaluation and confirmed a 2.2 cm lesion at L1 as well as pronounced focal uptake in the left kidney prostate, a 1.7 cm lesion in the right ischium, a markedly hypermetabolic 1.2 cm common iliac node and a smaller 6 mm left pelvic sidewall node with smaller, subcentimeter nodes scattered along the left common iliac chain.  He was referred for evaluation in urology with Dr. Junious Silk in June 2022.  He did have a diffusely nodular prostate and PSA at that time was 3.73.  Given the findings on DRE as well as imaging, he proceeded with transrectal ultrasound and biopsy of the prostate on 06/28/2020.  The prostate volume measured 33.7 cc.  Out of 12 core biopsies, 11 were positive.  The maximum Gleason score was 4+5, and this was seen in 4 cores with 4+4 and 4 cores, 4+3 in 2 cores and 3+4 in 1 core.  He was started on Firmagon ADT in early July 2022 and transition to [facility] in May 2022 along with the addition of Xtandi.  He had a good response to this therapy with PSA decreased to 0.24 on 08/02/2020 and most recently, 0.065 on 09/13/2020.  A follow-up PSMA PET scan was performed on  08/15/2020 with mild residual activity within the left lateral peripheral zone of the prostate, and decreased size of the left iliac node.  There was mild persistent uptake in the sclerotic lesion at L1 but the smaller lesion in the right iliac bone was no longer hypermetabolic.  A small sclerotic lesion in the left aspect of the manubrium was felt to be slightly larger measuring 7 mm as compared to 5 mm on previous imaging with mild persistent radiotracer activity and a lesion in the inferior right pubic ramus appeared increased in size at 26 mm as compared to 18 mm previously but without any significant radiotracer activity.  The patient reviewed the biopsy, imaging and lab results with his urologist and he has kindly been referred today for discussion of potential radiation treatment options.   PREVIOUS RADIATION THERAPY: No  PAST MEDICAL HISTORY:  Past Medical History:  Diagnosis Date   Aortic regurgitation    Aortic root aneurysm (HCC)    Atrial fibrillation (HCC)    BPH (benign prostatic hyperplasia)    Hyperlipidemia    Hypertension    Obesity    Pulmonary embolism (HCC)    Rhinitis    Sleep apnea    CPAP      PAST SURGICAL HISTORY: Past Surgical History:  Procedure Laterality Date   APPENDECTOMY     HERNIA REPAIR     TONSILLECTOMY      FAMILY HISTORY:  Family  History  Problem Relation Age of Onset   Stroke Mother    Alzheimer's disease Mother    Heart attack Mother    Stroke Father    Diabetes Mellitus II Father     SOCIAL HISTORY:  Social History   Socioeconomic History   Marital status: Married    Spouse name: Not on file   Number of children: 0   Years of education: Not on file   Highest education level: Not on file  Occupational History   Occupation: Engineering geologist  Tobacco Use   Smoking status: Never   Smokeless tobacco: Never  Vaping Use   Vaping Use: Never used  Substance and Sexual Activity   Alcohol use: No   Drug use: No   Sexual activity:  Not on file  Other Topics Concern   Not on file  Social History Narrative   Not on file   Social Determinants of Health   Financial Resource Strain: Not on file  Food Insecurity: Not on file  Transportation Needs: Not on file  Physical Activity: Not on file  Stress: Not on file  Social Connections: Not on file  Intimate Partner Violence: Not on file    ALLERGIES: Statins  MEDICATIONS:  Current Outpatient Medications  Medication Sig Dispense Refill   acetaminophen (TYLENOL) 500 MG tablet Take 2,000 mg by mouth daily as needed.      b complex vitamins tablet Take 1 tablet by mouth daily.     Chelated Zinc 50 MG TABS Take 1 tablet by mouth daily.     FARXIGA 5 MG TABS tablet Take 5 mg by mouth every morning.     finasteride (PROSCAR) 5 MG tablet Take 5 mg by mouth daily.     fish oil-omega-3 fatty acids 1000 MG capsule Take 1,200 g by mouth daily.     folic acid (FOLVITE) 619 MCG tablet Take 800 mcg by mouth daily.     hydrochlorothiazide (HYDRODIURIL) 25 MG tablet Take 1 tablet by mouth once daily 90 tablet 2   levocetirizine (XYZAL) 5 MG tablet Take 5 mg by mouth every evening.     losartan (COZAAR) 25 MG tablet Take 1 tablet (25 mg total) by mouth daily. 90 tablet 3   meloxicam (MOBIC) 7.5 MG tablet Take 7.5 mg by mouth in the morning and at bedtime.      metoprolol tartrate (LOPRESSOR) 25 MG tablet Take 1 tablet by mouth twice daily 180 tablet 3   Multiple Vitamins-Minerals (MENS MULTIVITAMIN PLUS PO) Take 1 tablet by mouth daily.     Nettle, Urtica Dioica, (NETTLE LEAF PO) Take by mouth.     Potassium 99 MG TABS Take 1 tablet by mouth daily.     ranitidine (ZANTAC) 75 MG tablet Take 75 mg by mouth 2 (two) times daily.     rivaroxaban (XARELTO) 20 MG TABS tablet Take 1 tablet (20 mg total) by mouth daily with supper. 90 tablet 1   rosuvastatin (CRESTOR) 5 MG tablet TAKE 1 TABLET BY MOUTH 3 TIMES A WEEK AND INCREASE AS TOLERATED. 45 tablet 3   tamsulosin (FLOMAX) 0.4 MG CAPS  capsule Take 0.4 mg by mouth daily after supper.     TART CHERRY PO Take by mouth.     UNABLE TO FIND Take 2 tablets by mouth daily. Med Name:Curamin     No current facility-administered medications for this encounter.    REVIEW OF SYSTEMS:  On review of systems, the patient reports that he is doing well  overall. He denies any chest pain, shortness of breath, cough, fevers, chills, night sweats, unintended weight changes. He denies any bowel disturbances, and denies abdominal pain, nausea or vomiting. He has chronic low back pain that has remained unchanged for many years but denies any new site-specific musculoskeletal or joint aches or pains. His IPSS was 15, indicating moderate urinary symptoms with nocturia x3, urgency, frequency and incomplete bladder emptying. His SHIM was 7, indicating he has severe erectile dysfunction. A complete review of systems is obtained and is otherwise negative.    PHYSICAL EXAM:  Wt Readings from Last 3 Encounters:  10/04/20 219 lb 12.8 oz (99.7 kg)  04/27/20 214 lb (97.1 kg)  03/16/20 222 lb (100.7 kg)   Temp Readings from Last 3 Encounters:  10/04/20 (!) 97.3 F (36.3 C)  04/27/20 98.4 F (36.9 C) (Skin)  08/19/19 98.2 F (36.8 C) (Skin)   BP Readings from Last 3 Encounters:  10/04/20 126/65  04/27/20 132/68  03/16/20 126/78   Pulse Readings from Last 3 Encounters:  10/04/20 97  04/27/20 91  03/16/20 73   Pain Assessment Pain Score: 0-No pain/10  In general this is a well appearing Caucasian male in no acute distress. He's alert and oriented x4 and appropriate throughout the examination. Cardiopulmonary assessment is negative for acute distress, and he exhibits normal effort.     KPS = 90  100 - Normal; no complaints; no evidence of disease. 90   - Able to carry on normal activity; minor signs or symptoms of disease. 80   - Normal activity with effort; some signs or symptoms of disease. 68   - Cares for self; unable to carry on normal  activity or to do active work. 60   - Requires occasional assistance, but is able to care for most of his personal needs. 50   - Requires considerable assistance and frequent medical care. 1   - Disabled; requires special care and assistance. 52   - Severely disabled; hospital admission is indicated although death not imminent. 69   - Very sick; hospital admission necessary; active supportive treatment necessary. 10   - Moribund; fatal processes progressing rapidly. 0     - Dead  Karnofsky DA, Abelmann [place], Craver LS and [place] [facility] [phone]) The use of the nitrogen mustards in the palliative treatment of carcinoma: with particular reference to bronchogenic carcinoma Cancer 1 634-56  LABORATORY DATA:  Lab Results  Component Value Date   WBC 8.3 11/13/2018   HGB 14.2 11/13/2018   HCT 41.6 11/13/2018   MCV 90 11/13/2018   PLT 228 11/13/2018   Lab Results  Component Value Date   NA 140 11/13/2018   K 3.7 11/13/2018   CL 102 11/13/2018   CO2 25 11/13/2018   Lab Results  Component Value Date   ALT 11 11/13/2018   AST 20 11/13/2018   ALKPHOS 89 11/13/2018   BILITOT 0.6 11/13/2018     RADIOGRAPHY: No results found.    IMPRESSION/PLAN: 1. 75 y.o. gentleman with Stage T2c, metastatic, castrate sensitive adenocarcinoma of the prostate with Gleason score of 4+5, and PSA of 3.73 with involvement of the pelvic lymph nodes and bony skeleton.  We discussed the patient's workup and outlined the nature of metastatic, castrate sensitive prostate cancer in this setting.  His disease appears to be responding favorably to the ADT but there is evidence of residual disease in the prostate, lumbar spine at L1 and the manubrium on review of his recent PSMA PET  scan.  Accordingly, he is eligible for consideration of definitive prostate radiation to include the pelvic lymph nodes as well as SBRT to target the lesions at L1 and in the manubrium.  He understands that while this treatment is not likely to be  curative, this will likely provide more durable control of his disease and improve his overall survival and quality of life as he may eventually be able to transition to intermittent ADT in the future.  We discussed the available radiation techniques, and focused on the details and logistics of delivery. We discussed and outlined the risks, benefits, short and long-term effects associated with radiotherapy and compared and contrasted these with prostatectomy. We discussed the role of SpaceOAR gel in reducing the rectal toxicity associated with radiotherapy. We also detailed the role of ADT in the treatment of advanced prostate cancer and outlined the associated side effects that could be expected with this therapy.  He appears to have a good understanding of his disease and our treatment recommendations which are of curative intent.  He was encouraged to ask questions that were answered to his stated satisfaction.  At the conclusion of our conversation, the patient is interested in moving forward with the recommended 8-week course of prostate IMRT concurrent with the ADT and androgen receptor blockade that he is already on.  He lives in [place] and therefore prefers to have this longer course of daily radiation closer to his home at [place].  He has a planned trip coming up at the end of October 2022 and prefers to start his treatment after he returns from that trip and possibly delay the start of treatment until the first of the new year.  He will give this further consideration and we will make a referral to Dr. Orlene Erm for further discussion and consideration of treatment closer to home.  He is also interested in proceeding with a 5 fraction course of focused, stereotactic body radiotherapy (SBRT) for treatment of the metastatic disease at L1 and in the manubrium.  He understands that this treatment will need to be completed here locally in [place] and is in agreement.  He definitely wants to  wait until after he returns from his trip but will give further consideration and let us know when he would like to proceed with this treatment and we will move forward with treatment planning accordingly at that time.  We will share our discussion with Dr. Junious Silk and look forward to continuing to participate in his care.   We personally spent 70 minutes in this encounter including chart review, reviewing radiological studies, meeting face-to-face with the patient, entering orders and completing documentation.    Nicholos Johns, PA-C    Tyler Pita, MD  [facility] Direct Dial: [phone]  Fax: [phone] [email or web address]  Skype  LinkedIn

## 2020-10-05 ENCOUNTER — Telehealth: Payer: Self-pay | Admitting: Oncology

## 2020-10-05 NOTE — Telephone Encounter (Signed)
Scheduled appt per 9/29 referral. Pt is aware of appt date and time.

## 2020-10-10 ENCOUNTER — Telehealth: Payer: Self-pay | Admitting: Oncology

## 2020-10-10 ENCOUNTER — Telehealth: Payer: Self-pay | Admitting: *Deleted

## 2020-10-10 NOTE — Telephone Encounter (Signed)
This was to be a Radiation New Patient Referral.  Patient is scheduled to see Dr Gatha Mayer on 10/12/20 - Canceled 10/14 New Patient Referral w/Dr Hinton Rao

## 2020-10-10 NOTE — Telephone Encounter (Signed)
RETURNED PATIENT'S PHONE CALL, SPOKE WITH PATIENT. ?

## 2020-10-11 ENCOUNTER — Ambulatory Visit
Admission: RE | Admit: 2020-10-11 | Discharge: 2020-10-11 | Disposition: A | Discharge status: Home / Self Care | Payer: PPO | Source: Ambulatory Visit | Attending: Radiation Oncology | Admitting: Radiation Oncology

## 2020-10-11 ENCOUNTER — Other Ambulatory Visit: Payer: Self-pay

## 2020-10-11 DIAGNOSIS — C7951 Secondary malignant neoplasm of bone: Secondary | ICD-10-CM | POA: Insufficient documentation

## 2020-10-11 DIAGNOSIS — C775 Secondary and unspecified malignant neoplasm of intrapelvic lymph nodes: Secondary | ICD-10-CM | POA: Insufficient documentation

## 2020-10-11 DIAGNOSIS — C61 Malignant neoplasm of prostate: Secondary | ICD-10-CM | POA: Diagnosis not present

## 2020-10-11 DIAGNOSIS — Z510 Encounter for antineoplastic radiation therapy: Secondary | ICD-10-CM | POA: Insufficient documentation

## 2020-10-12 DIAGNOSIS — C7951 Secondary malignant neoplasm of bone: Secondary | ICD-10-CM | POA: Diagnosis not present

## 2020-10-12 DIAGNOSIS — C61 Malignant neoplasm of prostate: Secondary | ICD-10-CM | POA: Diagnosis not present

## 2020-10-12 DIAGNOSIS — C775 Secondary and unspecified malignant neoplasm of intrapelvic lymph nodes: Secondary | ICD-10-CM | POA: Diagnosis not present

## 2020-10-12 NOTE — Progress Notes (Signed)
  Radiation Oncology         (336) [phone] ________________________________  Name: Aaron Richmond MRN: [id number]  Date: 10/11/2020  DOB: [birth year]  STEREOTACTIC BODY RADIOTHERAPY SIMULATION AND TREATMENT PLANNING NOTE    ICD-10-CM   1. Malignant neoplasm of prostate metastatic to bone ([place])  C61    C79.51     2. Malignant neoplasm of prostate ([place])  C61       DIAGNOSIS:  75 y.o. gentleman with Stage T2c, metastatic, castrate sensitive adenocarcinoma of the prostate with Gleason score of 4+5, and PSA of 3.73 with oligometastatic involvement of L1 and the Manubrium  NARRATIVE:  The patient was brought to the [facility].  Identity was confirmed.  All relevant records and images related to the planned course of therapy were reviewed.  The patient freely provided informed written consent to proceed with treatment after reviewing the details related to the planned course of therapy. The consent form was witnessed and verified by the simulation staff.  Then, the patient was set-up in a stable reproducible  supine position for radiation therapy.  A BodyFix immobilization pillow was fabricated for reproducible positioning.  Surface markings were placed.  The CT images were loaded into the planning software.  The gross target volumes (GTV) and planning target volumes (PTV) were delinieated, and avoidance structures were contoured.  Treatment planning then occurred.  The radiation prescription was entered and confirmed.  A total of two complex treatment devices were fabricated in the form of the BodyFix immobilization pillow and a neck accuform cushion.  I have requested : 3D Simulation  I have requested a DVH of the following structures: targets and all normal structures near the target including lungs, skin spinal cord, kidneys and bowel as noted on the radiation plan to maintain doses in adherence with established limits  SPECIAL TREATMENT PROCEDURE:  The planned course of therapy  using radiation constitutes a special treatment procedure. Special care is required in the management of this patient for the following reasons. High dose per fraction requiring special monitoring for increased toxicities of treatment including daily imaging..  The special nature of the planned course of radiotherapy will require increased physician supervision and oversight to ensure patient's safety with optimal treatment outcomes.    This requires extended time and effort.    PLAN:  The patient will receive 50 Gy in 5 fractions to L1 and the Manubrial metastasis.  ________________________________  Sheral Apley Tammi Klippel, M.D.

## 2020-10-16 ENCOUNTER — Other Ambulatory Visit: Payer: Self-pay

## 2020-10-16 MED ORDER — LOSARTAN POTASSIUM 25 MG PO TABS: 25.0000 mg | ORAL_TABLET | Freq: Every day | ORAL | 0 refills | Status: DC

## 2020-10-16 MED ORDER — HYDROCHLOROTHIAZIDE 25 MG PO TABS: 25.0000 mg | ORAL_TABLET | Freq: Every day | ORAL | 0 refills | Status: DC

## 2020-10-20 ENCOUNTER — Ambulatory Visit: Payer: PPO | Admitting: Oncology

## 2020-10-20 DIAGNOSIS — M533 Sacrococcygeal disorders, not elsewhere classified: Secondary | ICD-10-CM | POA: Diagnosis not present

## 2020-10-20 DIAGNOSIS — M4316 Spondylolisthesis, lumbar region: Secondary | ICD-10-CM | POA: Diagnosis not present

## 2020-10-20 DIAGNOSIS — R2681 Unsteadiness on feet: Secondary | ICD-10-CM | POA: Diagnosis not present

## 2020-10-23 ENCOUNTER — Ambulatory Visit
Admission: RE | Admit: 2020-10-23 | Discharge: 2020-10-23 | Disposition: A | Discharge status: Home / Self Care | Payer: PPO | Source: Ambulatory Visit | Attending: Radiation Oncology | Admitting: Radiation Oncology

## 2020-10-23 ENCOUNTER — Other Ambulatory Visit: Payer: Self-pay

## 2020-10-23 DIAGNOSIS — Z510 Encounter for antineoplastic radiation therapy: Secondary | ICD-10-CM | POA: Diagnosis not present

## 2020-10-23 DIAGNOSIS — C7951 Secondary malignant neoplasm of bone: Secondary | ICD-10-CM | POA: Diagnosis not present

## 2020-10-23 DIAGNOSIS — C61 Malignant neoplasm of prostate: Secondary | ICD-10-CM | POA: Diagnosis not present

## 2020-10-24 ENCOUNTER — Ambulatory Visit: Payer: PPO

## 2020-10-25 ENCOUNTER — Other Ambulatory Visit: Payer: Self-pay

## 2020-10-25 ENCOUNTER — Ambulatory Visit
Admission: RE | Admit: 2020-10-25 | Discharge: 2020-10-25 | Disposition: A | Discharge status: Home / Self Care | Payer: PPO | Source: Ambulatory Visit | Attending: Radiation Oncology | Admitting: Radiation Oncology

## 2020-10-25 DIAGNOSIS — Z510 Encounter for antineoplastic radiation therapy: Secondary | ICD-10-CM | POA: Diagnosis not present

## 2020-10-26 ENCOUNTER — Ambulatory Visit: Payer: PPO

## 2020-10-27 ENCOUNTER — Other Ambulatory Visit: Payer: Self-pay

## 2020-10-27 ENCOUNTER — Ambulatory Visit
Admission: RE | Admit: 2020-10-27 | Discharge: 2020-10-27 | Disposition: A | Discharge status: Home / Self Care | Payer: PPO | Source: Ambulatory Visit | Attending: Radiation Oncology | Admitting: Radiation Oncology

## 2020-10-27 DIAGNOSIS — Z510 Encounter for antineoplastic radiation therapy: Secondary | ICD-10-CM | POA: Diagnosis not present

## 2020-10-30 ENCOUNTER — Ambulatory Visit: Payer: PPO

## 2020-10-30 ENCOUNTER — Ambulatory Visit
Admission: RE | Admit: 2020-10-30 | Discharge: 2020-10-30 | Disposition: A | Discharge status: Home / Self Care | Payer: PPO | Source: Ambulatory Visit | Attending: Radiation Oncology | Admitting: Radiation Oncology

## 2020-10-30 ENCOUNTER — Other Ambulatory Visit: Payer: Self-pay

## 2020-10-30 DIAGNOSIS — Z510 Encounter for antineoplastic radiation therapy: Secondary | ICD-10-CM | POA: Diagnosis not present

## 2020-10-31 ENCOUNTER — Ambulatory Visit: Payer: PPO

## 2020-11-01 ENCOUNTER — Other Ambulatory Visit: Payer: Self-pay

## 2020-11-01 ENCOUNTER — Encounter: Payer: Self-pay | Admitting: Urology

## 2020-11-01 ENCOUNTER — Ambulatory Visit
Admission: RE | Admit: 2020-11-01 | Discharge: 2020-11-01 | Disposition: A | Discharge status: Home / Self Care | Payer: PPO | Source: Ambulatory Visit | Attending: Radiation Oncology | Admitting: Radiation Oncology

## 2020-11-01 ENCOUNTER — Ambulatory Visit: Payer: PPO

## 2020-11-01 DIAGNOSIS — C61 Malignant neoplasm of prostate: Secondary | ICD-10-CM | POA: Diagnosis not present

## 2020-11-01 DIAGNOSIS — Z510 Encounter for antineoplastic radiation therapy: Secondary | ICD-10-CM | POA: Diagnosis not present

## 2020-11-01 DIAGNOSIS — C7951 Secondary malignant neoplasm of bone: Secondary | ICD-10-CM | POA: Diagnosis not present

## 2020-11-02 ENCOUNTER — Ambulatory Visit: Payer: PPO

## 2020-11-03 ENCOUNTER — Ambulatory Visit: Payer: PPO

## 2020-11-07 ENCOUNTER — Other Ambulatory Visit: Payer: Self-pay

## 2020-11-07 ENCOUNTER — Ambulatory Visit: Payer: PPO

## 2020-11-07 MED ORDER — ROSUVASTATIN CALCIUM 5 MG PO TABS: ORAL_TABLET | ORAL | 0 refills | Status: DC

## 2020-11-09 ENCOUNTER — Ambulatory Visit: Payer: PPO

## 2020-11-10 ENCOUNTER — Other Ambulatory Visit: Payer: Self-pay | Admitting: Physician Assistant

## 2020-11-14 ENCOUNTER — Encounter: Payer: Self-pay | Admitting: Urology

## 2020-11-14 NOTE — Progress Notes (Addendum)
Patient met with Dr. Orlene Erm at the [facility] cancer center on 10/12/2020 and has elected to postpone his treatment and will be starting his daily prostate radiation in [place] in Jan. 2023, since this is much closer to his home.  Nicholos Johns, MMS, PA-C [facility] [place]: [phone]  Fax: [phone]

## 2020-11-15 ENCOUNTER — Other Ambulatory Visit: Payer: Self-pay

## 2020-11-15 DIAGNOSIS — I4891 Unspecified atrial fibrillation: Secondary | ICD-10-CM | POA: Diagnosis not present

## 2020-11-15 DIAGNOSIS — Z0000 Encounter for general adult medical examination without abnormal findings: Secondary | ICD-10-CM | POA: Diagnosis not present

## 2020-11-15 DIAGNOSIS — Z23 Encounter for immunization: Secondary | ICD-10-CM | POA: Diagnosis not present

## 2020-11-15 DIAGNOSIS — I10 Essential (primary) hypertension: Secondary | ICD-10-CM | POA: Diagnosis not present

## 2020-11-15 DIAGNOSIS — N400 Enlarged prostate without lower urinary tract symptoms: Secondary | ICD-10-CM | POA: Diagnosis not present

## 2020-11-15 DIAGNOSIS — N1831 Chronic kidney disease, stage 3a: Secondary | ICD-10-CM | POA: Diagnosis not present

## 2020-11-15 DIAGNOSIS — Z6831 Body mass index (BMI) 31.0-31.9, adult: Secondary | ICD-10-CM | POA: Diagnosis not present

## 2020-11-15 DIAGNOSIS — E669 Obesity, unspecified: Secondary | ICD-10-CM | POA: Diagnosis not present

## 2020-11-15 DIAGNOSIS — Z1331 Encounter for screening for depression: Secondary | ICD-10-CM | POA: Diagnosis not present

## 2020-11-15 DIAGNOSIS — E7800 Pure hypercholesterolemia, unspecified: Secondary | ICD-10-CM | POA: Diagnosis not present

## 2020-11-15 MED ORDER — METOPROLOL TARTRATE 25 MG PO TABS: 25.0000 mg | ORAL_TABLET | Freq: Two times a day (BID) | ORAL | 0 refills | Status: DC

## 2020-11-17 ENCOUNTER — Other Ambulatory Visit: Payer: Self-pay | Admitting: Physician Assistant

## 2020-11-17 NOTE — Telephone Encounter (Signed)
Pt has an appt with Dr. Agustin Cree on 11/21/20. Please address

## 2020-11-21 ENCOUNTER — Ambulatory Visit: Payer: PPO | Admitting: Cardiology

## 2020-11-21 ENCOUNTER — Encounter: Payer: Self-pay | Admitting: Cardiology

## 2020-11-21 ENCOUNTER — Other Ambulatory Visit: Payer: Self-pay

## 2020-11-21 VITALS — BP 110/58 | HR 93 | Ht 70.0 in | Wt 211.8 lb

## 2020-11-21 DIAGNOSIS — C61 Malignant neoplasm of prostate: Secondary | ICD-10-CM

## 2020-11-21 DIAGNOSIS — I351 Nonrheumatic aortic (valve) insufficiency: Secondary | ICD-10-CM

## 2020-11-21 DIAGNOSIS — E782 Mixed hyperlipidemia: Secondary | ICD-10-CM | POA: Diagnosis not present

## 2020-11-21 DIAGNOSIS — G4733 Obstructive sleep apnea (adult) (pediatric): Secondary | ICD-10-CM

## 2020-11-21 DIAGNOSIS — Z86711 Personal history of pulmonary embolism: Secondary | ICD-10-CM | POA: Diagnosis not present

## 2020-11-21 DIAGNOSIS — C7951 Secondary malignant neoplasm of bone: Secondary | ICD-10-CM | POA: Diagnosis not present

## 2020-11-21 DIAGNOSIS — I7121 Aneurysm of the ascending aorta, without rupture: Secondary | ICD-10-CM | POA: Diagnosis not present

## 2020-11-21 DIAGNOSIS — I420 Dilated cardiomyopathy: Secondary | ICD-10-CM

## 2020-11-21 MED ORDER — ROSUVASTATIN CALCIUM 5 MG PO TABS: 5.0000 mg | ORAL_TABLET | Freq: Every day | ORAL | 3 refills | Status: DC

## 2020-11-21 NOTE — Progress Notes (Signed)
Cardiology Office Note:    Date:  11/21/2020   ID:  Aaron Richmond, DOB 08/08/1945, MRN [id number]  PCP:  [name], MD  Cardiologist:  Jenne Campus, MD    Referring MD: Nona Dell, Corene Cornea, MD   Chief Complaint  Patient presents with   Follow-up  I am doing fine but have a prostate cancer  History of Present Illness:    Aaron Richmond is a 75 y.o. male   with history of persistent atrial fibrillation CHA2DS2-VASc score equals 3 on Xarelto, dilated asending aortic aneurysm 4.9 cm on CTA 03/2017 followed yearly by Dr. [name] who does not want operate unless diameter reaches 55 mm or there is another indication.  2D echo 02/2017 LVEF 50 to 55% with moderate aortic regurgitation.  Also has extensive coronary calcification on CT scan and positive family history of early CAD per patient has had no angina.  Also has hypertension, HLD. History of Pulmonary embolus after a long plane ride about 2009. He comes today to my office for follow-up.  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  He denies have any chest pain tightness squeezing pressure burning chest there is no shortness of breath there is no palpitations there is no swelling of lower extremities.  Overall he is doing well he was discovered to have prostate cancer with metastasis to bone.  He is being treated with hormonal therapy as well as radiation.  Past Medical History:  Diagnosis Date   Aortic regurgitation    Aortic root aneurysm    Atrial fibrillation (HCC)    BPH (benign prostatic hyperplasia)    Hyperlipidemia    Hypertension    Obesity    Pulmonary embolism (HCC)    Rhinitis    Sleep apnea    CPAP    Past Surgical History:  Procedure Laterality Date   APPENDECTOMY     HERNIA REPAIR     TONSILLECTOMY      Current Medications: Current Meds  Medication Sig    acetaminophen (TYLENOL) 500 MG tablet Take 500 mg by mouth every 6 (six) hours as needed for mild pain or moderate pain.   b complex vitamins capsule Take 1 capsule by mouth daily. Unknown strength   dapagliflozin propanediol (FARXIGA) 5 MG TABS tablet Take 5 mg by mouth daily.   folic acid (FOLVITE) 324 MCG tablet Take 400 mcg by mouth daily.   hydrochlorothiazide (HYDRODIURIL) 25 MG tablet Take 25 mg by mouth daily.   levocetirizine (XYZAL) 5 MG tablet Take 5 mg by mouth every evening.   losartan (COZAAR) 25 MG tablet Take 25 mg by mouth daily.   meloxicam (MOBIC) 7.5 MG tablet Take 7.5 mg by mouth 2 (two) times daily.   metoprolol tartrate (LOPRESSOR) 25 MG tablet Take 25 mg by mouth 2 (two) times daily.   Multiple Vitamin (MULTIVITAMINS PO) Take 1 tablet by mouth daily. Unknown strength   NETTLE, URTICA DIOICA, PO Take 1 tablet by mouth daily. Unknown strenght   Omega-3 1000 MG CAPS Take 1,200 mg by mouth daily.   Potassium 99 MG TABS Take 1 tablet by mouth daily.   ranitidine (ZANTAC) 75 MG tablet Take 75 mg by mouth 2 (two) times daily.   rivaroxaban (XARELTO) 20 MG TABS tablet Take 20 mg by mouth daily with supper.   rosuvastatin (CRESTOR) 5 MG  tablet TAKE 1 TABLET BY MOUTH 3 TIMES A WEEK AND INCREASE AS TOLERATED. (Patient taking differently: Take 5 mg by mouth daily.)   tamsulosin (FLOMAX) 0.4 MG CAPS capsule Take 0.4 mg by mouth daily after supper.   TART CHERRY PO Take 1 tablet by mouth daily. Unknown strenght   UNABLE TO FIND Take 2 tablets by mouth daily. Med Name: Curamin/ Unknown strength   zinc gluconate 50 MG tablet Take 50 mg by mouth daily.   [DISCONTINUED] Apple Cider Vinegar 600 MG CAPS Take 1 capsule by mouth in the morning, at noon, and at bedtime.   [DISCONTINUED] aspirin EC 81 MG tablet Take 81 mg by mouth daily. Swallow whole.   [DISCONTINUED] Aspirin-Salicylamide-Caffeine (BC HEADACHE PO) Take 1 Package by mouth as needed (Headaches/ Unknonw strength).    [DISCONTINUED] clopidogrel (PLAVIX) 75 MG tablet Take 75 mg by mouth daily.   [DISCONTINUED] fenofibrate 160 MG tablet Take 160 mg by mouth daily.   [DISCONTINUED] furosemide (LASIX) 40 MG tablet Take 40 mg by mouth daily.   [DISCONTINUED] gabapentin (NEURONTIN) 300 MG capsule Take 300 mg by mouth daily.   [DISCONTINUED] lisinopril (ZESTRIL) 10 MG tablet Take 10 mg by mouth daily.   [DISCONTINUED] pantoprazole (PROTONIX) 40 MG tablet Take 40 mg by mouth daily.   [DISCONTINUED] rOPINIRole (REQUIP) 1 MG tablet Take 1 mg by mouth 3 (three) times daily.   [DISCONTINUED] zolpidem (AMBIEN) 10 MG tablet Take 10 mg by mouth at bedtime as needed for sleep.     Allergies:   Statins   Social History   Socioeconomic History   Marital status: Married    Spouse name: Not on file   Number of children: 0   Years of education: Not on file   Highest education level: Not on file  Occupational History   Occupation: Engineering geologist  Tobacco Use   Smoking status: Never   Smokeless tobacco: Never  Vaping Use   Vaping Use: Never used  Substance and Sexual Activity   Alcohol use: No   Drug use: No   Sexual activity: Not on file  Other Topics Concern   Not on file  Social History Narrative   Not on file   Social Determinants of Health   Financial Resource Strain: Not on file  Food Insecurity: Not on file  Transportation Needs: Not on file  Physical Activity: Not on file  Stress: Not on file  Social Connections: Not on file     Family History: The patient's family history includes Alzheimer's disease in his mother; Diabetes Mellitus II in his father; Heart attack in his mother; Stroke in his father and mother. ROS:   Please see the history of present illness.    All 14 point review of systems negative except as described per history of present illness  EKGs/Labs/Other Studies Reviewed:      Recent Labs: No results found for requested labs within last 8760 hours.  Recent Lipid Panel     Component Value Date/Time   CHOL 132 11/13/2018 0924   TRIG 132 11/13/2018 0924   HDL 34 (L) 11/13/2018 0924   CHOLHDL 3.9 11/13/2018 0924   LDLCALC 74 11/13/2018 0924    Physical Exam:    VS:  BP (!) 110/58 (BP Location: Right Arm, Patient Position: Sitting)   Pulse 93   Ht 5\' 10"  (1.778 m)   Wt 211 lb 12.8 oz (96.1 kg)   SpO2 92%   BMI 30.39 kg/m     Wt Readings from  Last 3 Encounters:  11/21/20 211 lb 12.8 oz (96.1 kg)  10/04/20 219 lb 12.8 oz (99.7 kg)  04/27/20 214 lb (97.1 kg)     GEN:  Well nourished, well developed in no acute distress HEENT: Normal NECK: No JVD; No carotid bruits LYMPHATICS: No lymphadenopathy CARDIAC: Irregularly irregular, diastolic murmur grade 2/6 best heard at the left border of the sternum, no rubs, no gallops RESPIRATORY:  Clear to auscultation without rales, wheezing or rhonchi  ABDOMEN: Soft, non-tender, non-distended MUSCULOSKELETAL:  No edema; No deformity  SKIN: Warm and dry LOWER EXTREMITIES: no swelling NEUROLOGIC:  Alert and oriented x 3 PSYCHIATRIC:  Normal affect   ASSESSMENT:    1. Aneurysm of ascending aorta without rupture   2. Aortic valve insufficiency, etiology of cardiac valve disease unspecified   3. Dilated cardiomyopathy ([place])   4. Obstructive sleep apnea syndrome   5. Malignant neoplasm of prostate metastatic to bone ([place])   6. History of pulmonary embolism    PLAN:    In order of problems listed above:  IVUS of the ascending aorta was seen by cardiothoracic surgeon in April of this year.  The plan is to follow-up CT of his chest within the year. Aortic insufficiency.  Last estimation last year with some worrisome signs.  He is left ventricle ejection fraction started dropping there was some conversation about potentially doing surgery.  However he seems to be doing hemodynamically stable as well as not months signs and symptoms.  Obviously we will repeat his echocardiogram to look for degree of aortic  insufficiency as well as left ventricle ejection fraction and left ventricle size to decide if we need to pursue surgical intervention at this stage.  Of course situation is much more complicated right now since he does have prostate cancer Dilated cardiomyopathy last estimation ejection fraction 45%.  He was put on losartan he is already on beta-blocker which I will continue. Permanent atrial fibrillation: Rate controlled he is anticoagulant Xarelto which I will continue.  No plans to cardiovert. History of pulmonary emboli that was after long plane ride.  He is anticoagulated because of his atrial fibrillation which I will continue Dyslipidemia: I did review his K PN which show me his LDL of 142 HDL 42 this is from 11/15/2020.  He has been taking Crestor only 5 mg 3 times a week I did calculate his 10 years predicted risk which is very high 20.8%.  I advised him to go on Crestor 5 mg every day.  We will check his fasting lipid profile within the next 6 weeks. We did talk in length about his problem.  I told him that he must let me know if he start having shortness of breath swelling of lower extremities palpitations or dizziness.   Medication Adjustments/Labs and Tests Ordered: Current medicines are reviewed at length with the patient today.  Concerns regarding medicines are outlined above.  No orders of the defined types were placed in this encounter.  Medication changes: No orders of the defined types were placed in this encounter.   Signed, Park Liter, MD, [facility] 11/21/2020 9:07 AM    [place]

## 2020-11-21 NOTE — Patient Instructions (Signed)
Medication Instructions:  Your physician has recommended you make the following change in your medication:  INCREASE CRESTOR TO 5MG  EVERY DAY  *If you need a refill on your cardiac medications before your next appointment, please call your pharmacy*   Lab Work: Your physician recommends that you return for lab work in: [place]  If you have labs (blood work) drawn today and your tests are completely normal, you will receive your results only by: [place] (if you have MyChart) OR A paper copy in the mail If you have any lab test that is abnormal or we need to change your treatment, we will call you to review the results.   Testing/Procedures: Your physician has requested that you have an echocardiogram. Echocardiography is a painless test that uses sound waves to create images of your heart. It provides your doctor with information about the size and shape of your heart and how well your heart's chambers and valves are working. This procedure takes approximately one hour. There are no restrictions for this procedure.    Follow-Up: At [facility], you and your health needs are our priority.  As part of our continuing mission to provide you with exceptional heart care, we have created designated Provider Care Teams.  These Care Teams include your primary Cardiologist (physician) and Advanced Practice Providers (APPs -  Physician Assistants and Nurse Practitioners) who all work together to provide you with the care you need, when you need it.  We recommend signing up for the patient portal called "MyChart".  Sign up information is provided on this After Visit Summary.  MyChart is used to connect with patients for Virtual Visits (Telemedicine).  Patients are able to view lab/test results, encounter notes, upcoming appointments, etc.  Non-urgent messages can be sent to your provider as well.   To learn more about what you can do with MyChart, go to  [email or web address].    Your next appointment:   6 month(s)  The format for your next appointment:   In Person  Provider:   Jenne Campus, MD    Other Instructions

## 2020-11-29 ENCOUNTER — Encounter: Payer: Self-pay | Admitting: Urology

## 2020-11-29 NOTE — Progress Notes (Signed)
Per communication from Roma Kayser, [employer] counselor, this patient is eligible for genetic testing since he has metastatic prostate cancer. I will plan to offer this when I speak with him 12/06/20 for his 1 month f/u s/p SBRT.  Nicholos Johns, MMS, PA-C [facility] [place]: [phone]  Fax: [phone]

## 2020-12-05 ENCOUNTER — Encounter: Payer: Self-pay | Admitting: Urology

## 2020-12-05 NOTE — Progress Notes (Signed)
Patient states doing well. No symptoms reported at this time.I-PSS Score of 5 (mild).  Meaningful use complete.  Currently on Flomax 0.4mg  and urology follow-up scheduled for 12/27/20, w/ Dr. Daryel Gerald [facility].  Patient notified of 10:00am-12/06/20 and verbalized understanding.  Patient preferred contact # [phone]

## 2020-12-05 NOTE — Progress Notes (Signed)
  Radiation Oncology         [phone] ________________________________  Name: Aaron Richmond MRN: [id number]  Date: 11/01/2020  DOB: January 08, 1945  End of Treatment Note  Diagnosis:   75 y.o. gentleman with Stage T2c, metastatic, castrate sensitive adenocarcinoma of the prostate with Gleason score of 4+5, and PSA of 3.73 with oligometastatic involvement of L1 and the Manubrium     Indication for treatment:  Curative, Definitive SBRT       Radiation treatment dates:   10/23/20 - 11/01/20  Site/dose:   The targets at L1 and the munubrium were treated to 50 Gy in 5 fractions of 10 Gy each.  Beams/energy:   The patient was treated using stereotactic body radiotherapy according to a 3D conformal radiotherapy plan.  Volumetric arc fields were employed to deliver 6 MV X-rays.  Image guidance was performed with per fraction cone beam CT prior to treatment under personal MD supervision.  Immobilization was achieved using BodyFix Pillow.  Narrative: The patient tolerated radiation treatment relatively well with only modest fatigue.  Plan: The patient has completed radiation treatment. The patient will return to radiation oncology clinic for routine followup in one month. I advised them to call or return sooner if they have any questions or concerns related to their recovery or treatment. ________________________________  Sheral Apley. Tammi Klippel, M.D.

## 2020-12-06 ENCOUNTER — Ambulatory Visit
Admission: RE | Admit: 2020-12-06 | Discharge: 2020-12-06 | Disposition: A | Discharge status: Home / Self Care | Payer: PPO | Source: Ambulatory Visit | Attending: Urology | Admitting: Urology

## 2020-12-06 ENCOUNTER — Telehealth: Payer: Self-pay

## 2020-12-06 DIAGNOSIS — C61 Malignant neoplasm of prostate: Secondary | ICD-10-CM

## 2020-12-06 NOTE — Progress Notes (Signed)
Radiation Oncology         [phone] ________________________________  Name: Aaron Richmond MRN: [id number]  Date: 12/06/2020  DOB: September 03, 1945  Post Treatment Note  CC: [name], MD  Festus Aloe, MD  Diagnosis:   75 y.o. gentleman with Stage T2c, metastatic, castrate sensitive adenocarcinoma of the prostate with Gleason score of 4+5, and PSA of 3.73 with oligometastatic involvement of L1 and the Manubrium     Interval Since Last Radiation:  5 weeks  10/23/20 - 11/01/20:  The targets at L1 and the munubrium were treated to 50 Gy in 5 fractions of 10 Gy each.  Narrative:  I spoke with the patient to conduct his routine scheduled 1 month follow up visit via telephone to spare the patient unnecessary potential exposure in the healthcare setting during the current COVID-19 pandemic.  The patient was notified in advance and gave permission to proceed with this visit format.  He tolerated radiation treatment relatively well with only modest fatigue.                             On review of systems, the patient states that he is doing well in general.  He really did not experience any ill side effects with his recent stereotactic radiation treatments and remains without complaints aside from aggravating side effects associated with his ADT.  He continues with significant, frequent hot flashes and decreased stamina but reports a healthy appetite and is maintaining his weight.  He denies abdominal pain, nausea, vomiting, diarrhea or constipation.  He specifically denies dysuria, gross hematuria, straining to void, incomplete bladder emptying or incontinence.  Overall, he is pleased with his progress to date.  He has met with Dr. Orlene Erm in [place] and is planning to proceed with definitive prostate radiation in January 2023.  ALLERGIES:  is allergic to statins.  Meds: Current Outpatient Medications  Medication Sig Dispense Refill   acetaminophen (TYLENOL) 500 MG tablet Take 500 mg  by mouth every 6 (six) hours as needed for mild pain or moderate pain.     b complex vitamins capsule Take 1 capsule by mouth daily. Unknown strength     dapagliflozin propanediol (FARXIGA) 5 MG TABS tablet Take 5 mg by mouth daily.     folic acid (FOLVITE) 237 MCG tablet Take 400 mcg by mouth daily.     hydrochlorothiazide (HYDRODIURIL) 25 MG tablet Take 25 mg by mouth daily.     levocetirizine (XYZAL) 5 MG tablet Take 5 mg by mouth every evening.     losartan (COZAAR) 25 MG tablet Take 25 mg by mouth daily.     meloxicam (MOBIC) 7.5 MG tablet Take 7.5 mg by mouth 2 (two) times daily.     metoprolol tartrate (LOPRESSOR) 25 MG tablet Take 25 mg by mouth 2 (two) times daily.     Multiple Vitamin (MULTIVITAMINS PO) Take 1 tablet by mouth daily. Unknown strength     NETTLE, URTICA DIOICA, PO Take 1 tablet by mouth daily. Unknown strenght     Omega-3 1000 MG CAPS Take 1,200 mg by mouth daily.     Potassium 99 MG TABS Take 1 tablet by mouth daily.     ranitidine (ZANTAC) 75 MG tablet Take 75 mg by mouth 2 (two) times daily.     rivaroxaban (XARELTO) 20 MG TABS tablet Take 20 mg by mouth daily with supper.     rosuvastatin (CRESTOR) 5 MG tablet Take  1 tablet (5 mg total) by mouth daily. 90 tablet 3   tamsulosin (FLOMAX) 0.4 MG CAPS capsule Take 0.4 mg by mouth daily after supper.     TART CHERRY PO Take 1 tablet by mouth daily. Unknown strenght     UNABLE TO FIND Take 2 tablets by mouth daily. Med Name: Curamin/ Unknown strength     zinc gluconate 50 MG tablet Take 50 mg by mouth daily.     No current facility-administered medications for this encounter.    Physical Findings:  vitals were not taken for this visit.  Pain Assessment Pain Score: 0-No pain/10 Unable to assess due to telephone follow-up visit format.  Lab Findings: Lab Results  Component Value Date   WBC 8.3 11/13/2018   HGB 14.2 11/13/2018   HCT 41.6 11/13/2018   MCV 90 11/13/2018   PLT 228 11/13/2018     Radiographic  Findings: No results found.  Impression/Plan: 1. 75 y.o. gentleman with Stage T2c, metastatic, castrate sensitive adenocarcinoma of the prostate with Gleason score of 4+5, and PSA of 3.73 with oligometastatic involvement of L1 and the Manubrium.  He appears to have recovered well from the effects of his recent SBRT and is currently without complaints.  He has met with Dr. Orlene Erm, in [place], and plans to move forward with definitive prostate radiotherapy in January 2023, closer to his home in [place].  He has a scheduled follow-up visit with Dr. Junious Silk on 12/27/2020 with labs prior to that visit.  He intends to continue on the 73-monthEligard ADT and daily Xtandi under the care and direction of Dr. EJunious Silk  We discussed that while we are happy to continue to participate in his care if clinically indicated, at this point, we will plan to see him back on an as-needed basis.  We enjoyed taking care of him and he knows that he is welcome to call anytime with any questions or concerns related to his radiation treatment.     ANicholos Johns PA-C

## 2020-12-06 NOTE — Telephone Encounter (Signed)
Please reach out to Dr. Lyndal Rainbow nurse to have her look into prescription delivery concerns for the patient's Xytiga medication. He said that he ran out of medication for 2 days before the refill was delivered this month and wants to make sure this does not continue to be a problem. Not sure if it is insurance related or what since we don't prescribe these medications... Let the nurse know that [name] would like a call from her regarding this matter. -Per Freeman Caldron  I have contacted the voicemail of Dr. Lyndal Rainbow nurse at [facility] and left a detailed message regarding the note above. I left my extension [phone] if there's a need to call me back.

## 2020-12-11 ENCOUNTER — Other Ambulatory Visit: Payer: Self-pay | Admitting: Physician Assistant

## 2020-12-12 NOTE — Telephone Encounter (Signed)
This is a [place] pt °

## 2020-12-13 ENCOUNTER — Ambulatory Visit (INDEPENDENT_AMBULATORY_CARE_PROVIDER_SITE_OTHER): Payer: PPO

## 2020-12-13 ENCOUNTER — Other Ambulatory Visit: Payer: Self-pay

## 2020-12-13 ENCOUNTER — Other Ambulatory Visit: Payer: Self-pay | Admitting: Cardiology

## 2020-12-13 DIAGNOSIS — I351 Nonrheumatic aortic (valve) insufficiency: Secondary | ICD-10-CM | POA: Diagnosis not present

## 2020-12-13 LAB — ECHOCARDIOGRAM COMPLETE
Area-P 1/2: 4.93 cm2
P 1/2 time: 262 msec
S' Lateral: 4.7 cm
Single Plane A4C EF: 40.8 %

## 2020-12-14 NOTE — Telephone Encounter (Signed)
Prescription refill request for Xarelto received.  Indication:Afib Last office visit:11/22 Weight:96.1 kg Age:75 Scr:1.4 CrCl:62.92 ml/min  Prescription refilled

## 2020-12-20 ENCOUNTER — Other Ambulatory Visit: Payer: Self-pay | Admitting: Cardiology

## 2020-12-20 DIAGNOSIS — C61 Malignant neoplasm of prostate: Secondary | ICD-10-CM | POA: Diagnosis not present

## 2020-12-27 DIAGNOSIS — C7951 Secondary malignant neoplasm of bone: Secondary | ICD-10-CM | POA: Diagnosis not present

## 2020-12-27 DIAGNOSIS — C775 Secondary and unspecified malignant neoplasm of intrapelvic lymph nodes: Secondary | ICD-10-CM | POA: Diagnosis not present

## 2020-12-27 DIAGNOSIS — C61 Malignant neoplasm of prostate: Secondary | ICD-10-CM | POA: Diagnosis not present

## 2021-01-08 ENCOUNTER — Other Ambulatory Visit: Payer: Self-pay | Admitting: Cardiology

## 2021-01-10 DIAGNOSIS — Z510 Encounter for antineoplastic radiation therapy: Secondary | ICD-10-CM | POA: Diagnosis not present

## 2021-01-10 DIAGNOSIS — C61 Malignant neoplasm of prostate: Secondary | ICD-10-CM | POA: Diagnosis not present

## 2021-01-15 DIAGNOSIS — J0110 Acute frontal sinusitis, unspecified: Secondary | ICD-10-CM | POA: Diagnosis not present

## 2021-01-18 DIAGNOSIS — C61 Malignant neoplasm of prostate: Secondary | ICD-10-CM | POA: Diagnosis not present

## 2021-01-18 DIAGNOSIS — Z510 Encounter for antineoplastic radiation therapy: Secondary | ICD-10-CM | POA: Diagnosis not present

## 2021-01-19 DIAGNOSIS — C61 Malignant neoplasm of prostate: Secondary | ICD-10-CM | POA: Diagnosis not present

## 2021-01-22 DIAGNOSIS — Z510 Encounter for antineoplastic radiation therapy: Secondary | ICD-10-CM | POA: Diagnosis not present

## 2021-01-22 DIAGNOSIS — C61 Malignant neoplasm of prostate: Secondary | ICD-10-CM | POA: Diagnosis not present

## 2021-01-29 DIAGNOSIS — C61 Malignant neoplasm of prostate: Secondary | ICD-10-CM | POA: Diagnosis not present

## 2021-02-05 DIAGNOSIS — C61 Malignant neoplasm of prostate: Secondary | ICD-10-CM | POA: Diagnosis not present

## 2021-02-06 DIAGNOSIS — C61 Malignant neoplasm of prostate: Secondary | ICD-10-CM | POA: Diagnosis not present

## 2021-02-07 DIAGNOSIS — Z510 Encounter for antineoplastic radiation therapy: Secondary | ICD-10-CM | POA: Diagnosis not present

## 2021-02-07 DIAGNOSIS — C61 Malignant neoplasm of prostate: Secondary | ICD-10-CM | POA: Diagnosis not present

## 2021-02-08 DIAGNOSIS — C61 Malignant neoplasm of prostate: Secondary | ICD-10-CM | POA: Diagnosis not present

## 2021-02-09 DIAGNOSIS — C61 Malignant neoplasm of prostate: Secondary | ICD-10-CM | POA: Diagnosis not present

## 2021-02-09 DIAGNOSIS — J0110 Acute frontal sinusitis, unspecified: Secondary | ICD-10-CM | POA: Diagnosis not present

## 2021-02-12 ENCOUNTER — Other Ambulatory Visit: Payer: Self-pay | Admitting: Physician Assistant

## 2021-02-12 DIAGNOSIS — C61 Malignant neoplasm of prostate: Secondary | ICD-10-CM | POA: Diagnosis not present

## 2021-02-13 DIAGNOSIS — M5416 Radiculopathy, lumbar region: Secondary | ICD-10-CM | POA: Diagnosis not present

## 2021-02-14 DIAGNOSIS — C61 Malignant neoplasm of prostate: Secondary | ICD-10-CM | POA: Diagnosis not present

## 2021-02-15 DIAGNOSIS — C61 Malignant neoplasm of prostate: Secondary | ICD-10-CM | POA: Diagnosis not present

## 2021-02-16 DIAGNOSIS — C61 Malignant neoplasm of prostate: Secondary | ICD-10-CM | POA: Diagnosis not present

## 2021-02-19 DIAGNOSIS — C61 Malignant neoplasm of prostate: Secondary | ICD-10-CM | POA: Diagnosis not present

## 2021-02-21 DIAGNOSIS — C61 Malignant neoplasm of prostate: Secondary | ICD-10-CM | POA: Diagnosis not present

## 2021-02-21 DIAGNOSIS — E7800 Pure hypercholesterolemia, unspecified: Secondary | ICD-10-CM | POA: Diagnosis not present

## 2021-02-21 DIAGNOSIS — I10 Essential (primary) hypertension: Secondary | ICD-10-CM | POA: Diagnosis not present

## 2021-02-21 DIAGNOSIS — M159 Polyosteoarthritis, unspecified: Secondary | ICD-10-CM | POA: Diagnosis not present

## 2021-02-22 DIAGNOSIS — E7800 Pure hypercholesterolemia, unspecified: Secondary | ICD-10-CM | POA: Diagnosis not present

## 2021-02-22 DIAGNOSIS — C61 Malignant neoplasm of prostate: Secondary | ICD-10-CM | POA: Diagnosis not present

## 2021-02-23 DIAGNOSIS — C61 Malignant neoplasm of prostate: Secondary | ICD-10-CM | POA: Diagnosis not present

## 2021-02-26 DIAGNOSIS — C61 Malignant neoplasm of prostate: Secondary | ICD-10-CM | POA: Diagnosis not present

## 2021-02-28 DIAGNOSIS — C61 Malignant neoplasm of prostate: Secondary | ICD-10-CM | POA: Diagnosis not present

## 2021-03-01 DIAGNOSIS — C61 Malignant neoplasm of prostate: Secondary | ICD-10-CM | POA: Diagnosis not present

## 2021-03-02 DIAGNOSIS — C61 Malignant neoplasm of prostate: Secondary | ICD-10-CM | POA: Diagnosis not present

## 2021-03-05 DIAGNOSIS — C61 Malignant neoplasm of prostate: Secondary | ICD-10-CM | POA: Diagnosis not present

## 2021-03-06 DIAGNOSIS — C61 Malignant neoplasm of prostate: Secondary | ICD-10-CM | POA: Diagnosis not present

## 2021-03-07 DIAGNOSIS — C775 Secondary and unspecified malignant neoplasm of intrapelvic lymph nodes: Secondary | ICD-10-CM | POA: Diagnosis not present

## 2021-03-07 DIAGNOSIS — Z510 Encounter for antineoplastic radiation therapy: Secondary | ICD-10-CM | POA: Diagnosis not present

## 2021-03-07 DIAGNOSIS — C61 Malignant neoplasm of prostate: Secondary | ICD-10-CM | POA: Diagnosis not present

## 2021-03-07 DIAGNOSIS — C7951 Secondary malignant neoplasm of bone: Secondary | ICD-10-CM | POA: Diagnosis not present

## 2021-03-07 DIAGNOSIS — R351 Nocturia: Secondary | ICD-10-CM | POA: Diagnosis not present

## 2021-03-08 DIAGNOSIS — M1611 Unilateral primary osteoarthritis, right hip: Secondary | ICD-10-CM | POA: Diagnosis not present

## 2021-03-08 DIAGNOSIS — C61 Malignant neoplasm of prostate: Secondary | ICD-10-CM | POA: Diagnosis not present

## 2021-03-08 DIAGNOSIS — I429 Cardiomyopathy, unspecified: Secondary | ICD-10-CM | POA: Diagnosis not present

## 2021-03-08 DIAGNOSIS — M47816 Spondylosis without myelopathy or radiculopathy, lumbar region: Secondary | ICD-10-CM | POA: Diagnosis not present

## 2021-03-08 DIAGNOSIS — D6869 Other thrombophilia: Secondary | ICD-10-CM | POA: Diagnosis not present

## 2021-03-08 DIAGNOSIS — I480 Paroxysmal atrial fibrillation: Secondary | ICD-10-CM | POA: Diagnosis not present

## 2021-03-08 DIAGNOSIS — I712 Thoracic aortic aneurysm, without rupture, unspecified: Secondary | ICD-10-CM | POA: Diagnosis not present

## 2021-03-08 DIAGNOSIS — I10 Essential (primary) hypertension: Secondary | ICD-10-CM | POA: Diagnosis not present

## 2021-03-08 DIAGNOSIS — Z7901 Long term (current) use of anticoagulants: Secondary | ICD-10-CM | POA: Diagnosis not present

## 2021-03-09 DIAGNOSIS — C61 Malignant neoplasm of prostate: Secondary | ICD-10-CM | POA: Diagnosis not present

## 2021-03-12 DIAGNOSIS — C61 Malignant neoplasm of prostate: Secondary | ICD-10-CM | POA: Diagnosis not present

## 2021-03-14 DIAGNOSIS — C61 Malignant neoplasm of prostate: Secondary | ICD-10-CM | POA: Diagnosis not present

## 2021-03-15 ENCOUNTER — Other Ambulatory Visit: Payer: Self-pay | Admitting: Cardiothoracic Surgery

## 2021-03-15 ENCOUNTER — Telehealth: Payer: Self-pay

## 2021-03-15 DIAGNOSIS — I7121 Aneurysm of the ascending aorta, without rupture: Secondary | ICD-10-CM

## 2021-03-15 DIAGNOSIS — C61 Malignant neoplasm of prostate: Secondary | ICD-10-CM | POA: Diagnosis not present

## 2021-03-15 NOTE — Telephone Encounter (Signed)
Patient with diagnosis of afib on Xarelto for anticoagulation.   ? ?Procedure: lumbar ESI ?Date of procedure: TBD ? ?CHA2DS2-VASc Score = 4  ?This indicates a 4.8% annual risk of stroke. ?The patient's score is based upon: ?CHF History: 0 ?HTN History: 1 ?Diabetes History: 1 ?Stroke History: 0 ?Vascular Disease History: 1 ?Age Score: 1 ?Gender Score: 0 ?  ?Also with hx of PE after long flight in 2009 ? ?CrCl 5m.min using adjusted body weight ?Platelet count 170K ? ?Per office protocol, patient should hold Xarelto for 3 days prior to procedure.   ?

## 2021-03-15 NOTE — Telephone Encounter (Signed)
? ?  [facility] Pre-operative Risk Assessment  ?  ?Request for surgical clearance: ? ?What type of surgery is being performed? Lumbar Epidural Steroid Injection   ? ?When is this surgery scheduled? TBD  ? ?What type of clearance is required (medical clearance vs. Pharmacy clearance to hold med vs. Both)? Pharmacy ? ?Are there any medications that need to be held prior to surgery and how long?Xarelto  to be held one day prior to the procedure   ? ?Practice name and name of physician performing surgery? Carolyne Littles at [facility]   ? ?What is your office phone number: [phone]  ?  ?7.   What is your office fax number: [phone] ATT: Lexine Baton ? ?8.   Anesthesia type (None, local, MAC, general) ? Not specified  ? ? ?Basil Dess Aldo S[place]geroth ?03/15/2021, 11:31 AM  ?_________________________________________________________________ ?  (provider comments below) ? ?

## 2021-03-15 NOTE — Telephone Encounter (Signed)
? ?  Primary Cardiologist: [name], MD ? ?Chart reviewed as part of pre-operative protocol coverage for Aaron Richmond . Recommendations regarding anticoagulation are as follows from clinical pharmacist: ? ?Pt with diagnosis of afib on Xarelto for anticoagulation.   ?  ?Procedure: lumbar ESI ?Date of procedure: TBD ?  ?CHA2DS2-VASc Score = 4  ?This indicates a 4.8% annual risk of stroke. ?The patient's score is based upon: ?CHF History: 0 ?HTN History: 1 ?Diabetes History: 1 ?Stroke History: 0 ?Vascular Disease History: 1 ?Age Score: 1 ?Gender Score: 0 ?  ?Also with hx of PE after long flight in 2009 ?  ?CrCl 80m.min using adjusted body weight ?Platelet count 170K ?  ?Per office protocol, patient should hold Xarelto for 3 days prior to procedure ? ?I will route this recommendation to the requesting party via Epic fax function and remove from pre-op pool. ? ?Please call with questions. ? ?MEmmaline Life NP-C ? ?  ?03/15/2021, 4:40 PM ?[place]?[address] ?Office [phone] ([phone]? ? ?

## 2021-03-15 NOTE — Telephone Encounter (Signed)
Patient with persistent atrial fibrillation on chronic anticoagulation ? ?Request to hold 24 hours prior to Lumbar ESI  ?Clearance TBD ?

## 2021-03-16 DIAGNOSIS — C61 Malignant neoplasm of prostate: Secondary | ICD-10-CM | POA: Diagnosis not present

## 2021-04-17 DIAGNOSIS — T887XXA Unspecified adverse effect of drug or medicament, initial encounter: Secondary | ICD-10-CM | POA: Diagnosis not present

## 2021-04-17 DIAGNOSIS — J069 Acute upper respiratory infection, unspecified: Secondary | ICD-10-CM | POA: Diagnosis not present

## 2021-05-04 DIAGNOSIS — M4726 Other spondylosis with radiculopathy, lumbar region: Secondary | ICD-10-CM | POA: Diagnosis not present

## 2021-05-04 DIAGNOSIS — M4316 Spondylolisthesis, lumbar region: Secondary | ICD-10-CM | POA: Diagnosis not present

## 2021-05-04 DIAGNOSIS — M5416 Radiculopathy, lumbar region: Secondary | ICD-10-CM | POA: Diagnosis not present

## 2021-05-14 ENCOUNTER — Ambulatory Visit: Payer: PPO | Admitting: Cardiothoracic Surgery

## 2021-05-14 ENCOUNTER — Encounter: Payer: Self-pay | Admitting: Cardiothoracic Surgery

## 2021-05-14 ENCOUNTER — Ambulatory Visit
Admission: RE | Admit: 2021-05-14 | Discharge: 2021-05-14 | Disposition: A | Discharge status: Home / Self Care | Payer: PPO | Source: Ambulatory Visit | Attending: Cardiothoracic Surgery | Admitting: Cardiothoracic Surgery

## 2021-05-14 VITALS — BP 132/68 | HR 98 | Resp 20 | Ht 70.0 in | Wt 206.0 lb

## 2021-05-14 DIAGNOSIS — I7121 Aneurysm of the ascending aorta, without rupture: Secondary | ICD-10-CM

## 2021-05-14 DIAGNOSIS — M314 Aortic arch syndrome [Takayasu]: Secondary | ICD-10-CM

## 2021-05-14 HISTORY — DX: Aortic arch syndrome (takayasu): M31.4

## 2021-05-14 NOTE — Progress Notes (Signed)
HPI ?76 year old male with metastatic prostate disease to bone presents for routine annual surveillance CT scan of the ascending aorta.  This ha yets a.m. please call placed getting test probably get the [facility] meeting for scan will tell him 6:00 remained 4.8-4.9 cm for the past several years.  It is asymptomatic.  He also has associated stable moderate aortic insufficiency and is followed by Dr. Agustin Cree.  He has rate controlled atrial fibrillation on Xarelto. ? ?Since his last visit to our office he denies chest pain.  He remains active and has a trip planned to [place] later this summer. ? ?Current Outpatient Medications  ?Medication Sig Dispense Refill  ? acetaminophen (TYLENOL) 500 MG tablet Take 500 mg by mouth every 6 (six) hours as needed for mild pain or moderate pain.    ? b complex vitamins capsule Take 1 capsule by mouth daily. Unknown strength    ? dapagliflozin propanediol (FARXIGA) 5 MG TABS tablet Take 5 mg by mouth daily.    ? Enzalutamide (XTANDI PO) Take by mouth.    ? folic acid (FOLVITE) 811 MCG tablet Take 400 mcg by mouth daily.    ? hydrochlorothiazide (HYDRODIURIL) 25 MG tablet Take 1 tablet (25 mg total) by mouth daily. 90 tablet 2  ? levocetirizine (XYZAL) 5 MG tablet Take 5 mg by mouth every evening.    ? losartan (COZAAR) 25 MG tablet Take 1 tablet (25 mg total) by mouth daily. 90 tablet 2  ? meloxicam (MOBIC) 7.5 MG tablet Take 7.5 mg by mouth 2 (two) times daily.    ? metoprolol tartrate (LOPRESSOR) 25 MG tablet TAKE 1 TABLET BY MOUTH TWICE DAILY, PLEASE  KEEP  UPCOMING  APPOINTMENT  IN  NOVEMBER  2022  WITH  DR.  Agustin Cree  BEFORE  ANYMORE  REFILLS. 180 tablet 0  ? Multiple Vitamin (MULTIVITAMINS PO) Take 1 tablet by mouth daily. Unknown strength    ? NETTLE, URTICA DIOICA, PO Take 1 tablet by mouth daily. Unknown strenght    ? Omega-3 1000 MG CAPS Take 1,200 mg by mouth daily.    ? Potassium 99 MG TABS Take 1 tablet by mouth daily.    ? ranitidine (ZANTAC) 75 MG tablet Take 75 mg by  mouth 2 (two) times daily.    ? rivaroxaban (XARELTO) 20 MG TABS tablet TAKE 1 TABLET BY MOUTH ONCE DAILY WITH SUPPER 90 tablet 1  ? tamsulosin (FLOMAX) 0.4 MG CAPS capsule Take 0.4 mg by mouth daily after supper.    ? TART CHERRY PO Take 1 tablet by mouth daily. Unknown strenght    ? UNABLE TO FIND Take 2 tablets by mouth daily. Med Name: Curamin/ Unknown strength    ? zinc gluconate 50 MG tablet Take 50 mg by mouth daily.    ? rosuvastatin (CRESTOR) 5 MG tablet Take 1 tablet (5 mg total) by mouth daily. 90 tablet 3  ? ?No current facility-administered medications for this visit.  ? ? ? ?Review of Systems: ?Weight stable ?No chest pain ?No ankle edema or abdominal bloating ?No syncope or presyncope ?No active dental complaints ? ? ?Physical Exam ?Blood pressure 132/68, pulse 98, resp. rate 20, height '5\' 10"'$  (1.778 m), weight 206 lb (93.4 kg), SpO2 93 %.  ? ?  ?   Exam ? ?  General- alert and comfortable ?   Neck- no JVD, no cervical adenopathy palpable, no carotid bruit ?  Lungs- clear without rales, wheezes ?  Cor- irregular rate and rhythm, 3/6 AI  murmur , no gallop ?  Abdomen- soft, non-tender ?  Extremities - warm, non-tender, minimal edema ?  Neuro- oriented, appropriate, no focal weakness ? ? ?Diagnostic Tests: ?CT chest images personally reviewed showing the stable ascending fusiform aneurysm measuring 4.8 cm.  Lungs are clear.  Metastatic prostate cancer bone lesions are smaller since last scan. ? ?Impression: ?Stable asymptomatic 4.8 cm fusiform aneurysm with well-controlled blood pressure.  Continue current surveillance scanning annually.  Patient would be a marginal candidate for surgery with metastatic prostate cancer. ? ?Plan: Return for surveillance CT scan of chest in 1 year. ? ? ? ?Dahlia Byes, MD ?[facility] ?[phone] ? ? ? ? ? ?

## 2021-05-15 ENCOUNTER — Other Ambulatory Visit: Payer: Self-pay | Admitting: Cardiology

## 2021-05-23 ENCOUNTER — Ambulatory Visit: Payer: PPO | Admitting: Cardiology

## 2021-05-23 ENCOUNTER — Encounter: Payer: Self-pay | Admitting: Cardiology

## 2021-05-23 VITALS — BP 112/60 | HR 78 | Ht 70.0 in | Wt 204.2 lb

## 2021-05-23 DIAGNOSIS — I7121 Aneurysm of the ascending aorta, without rupture: Secondary | ICD-10-CM

## 2021-05-23 DIAGNOSIS — I10 Essential (primary) hypertension: Secondary | ICD-10-CM | POA: Diagnosis not present

## 2021-05-23 DIAGNOSIS — I420 Dilated cardiomyopathy: Secondary | ICD-10-CM | POA: Diagnosis not present

## 2021-05-23 DIAGNOSIS — E782 Mixed hyperlipidemia: Secondary | ICD-10-CM | POA: Diagnosis not present

## 2021-05-23 DIAGNOSIS — E7800 Pure hypercholesterolemia, unspecified: Secondary | ICD-10-CM | POA: Diagnosis not present

## 2021-05-23 DIAGNOSIS — I4891 Unspecified atrial fibrillation: Secondary | ICD-10-CM | POA: Diagnosis not present

## 2021-05-23 NOTE — Patient Instructions (Addendum)

## 2021-05-23 NOTE — Progress Notes (Signed)
Cardiology Office Note:    Date:  05/23/2021   ID:  Aaron Richmond, DOB July 24, 1945, MRN [id number]  PCP:  [name], MD  Cardiologist:  Jenne Campus, MD    Referring MD: Nona Dell, Corene Cornea, MD   Chief Complaint  Patient presents with   Follow-up         History of Present Illness:    Aaron Richmond is a 76 y.o. male  with history of persistent atrial fibrillation CHA2DS2-VASc score equals 3 on Xarelto, dilated asending aortic aneurysm 4.9 cm on CTA 03/2017 followed yearly by Dr. [name] who does not want operate unless diameter reaches 55 mm or there is another indication.  2D echo 02/2017 LVEF 50 to 55% with moderate aortic regurgitation.  Also has extensive coronary calcification on CT scan and positive family history of early CAD per patient has had no angina.  Also has hypertension, HLD. History of Pulmonary embolus after a long plane ride about 2009. He comes today to my office for follow-up.  Overall he is doing well denies having complicated complaints.  There is no chest pain tightness squeezing pressure burning chest.  Ability to exercise is limited because of chronic back pain.  Past Medical History:  Diagnosis Date   Aortic regurgitation    Aortic root aneurysm (HCC)    Atrial fibrillation (HCC)    BPH (benign prostatic hyperplasia)    Hyperlipidemia    Hypertension    Obesity    Pulmonary embolism (HCC)    Rhinitis    Sleep apnea    CPAP    Past Surgical History:  Procedure Laterality Date   APPENDECTOMY     HERNIA REPAIR     TONSILLECTOMY      Current Medications: Current Meds  Medication Sig   acetaminophen (TYLENOL) 500 MG tablet Take 500 mg by mouth every 6 (six) hours as needed for mild pain or moderate pain.   b complex vitamins capsule Take 1 capsule by mouth daily. Unknown strength   dapagliflozin propanediol (FARXIGA) 5 MG TABS tablet Take 5 mg by mouth daily.   Enzalutamide (XTANDI PO) Take 1 tablet by mouth daily.   folic acid  (FOLVITE) 951 MCG tablet Take 400 mcg by mouth daily.   hydrochlorothiazide (HYDRODIURIL) 25 MG tablet Take 1 tablet (25 mg total) by mouth daily.   levocetirizine (XYZAL) 5 MG tablet Take 5 mg by mouth every evening.   losartan (COZAAR) 25 MG tablet Take 1 tablet (25 mg total) by mouth daily.   meloxicam (MOBIC) 7.5 MG tablet Take 7.5 mg by mouth 2 (two) times daily.   metoprolol tartrate (LOPRESSOR) 25 MG tablet Take 1 tablet by mouth twice daily   Multiple Vitamin (MULTIVITAMINS PO) Take 1 tablet by mouth daily. Unknown strength   NETTLE, URTICA DIOICA, PO Take 1 tablet by mouth daily. Unknown strenght   Omega-3 1000 MG CAPS Take 1,200 mg by mouth daily.   Potassium 99 MG TABS Take 1 tablet by mouth daily.   ranitidine (ZANTAC) 75 MG tablet Take 75 mg by mouth 2 (two) times daily.   rivaroxaban (XARELTO) 20 MG TABS tablet TAKE 1 TABLET BY MOUTH ONCE DAILY WITH SUPPER   rosuvastatin (CRESTOR) 5 MG tablet Take 1 tablet (5 mg total) by mouth daily.   tamsulosin (FLOMAX) 0.4 MG CAPS capsule Take 0.4 mg by mouth daily after supper.   TART CHERRY PO Take 1 tablet by mouth daily. Unknown strenght   UNABLE TO FIND Take 2 tablets  by mouth daily. Med Name: Curamin/ Unknown strength   zinc gluconate 50 MG tablet Take 50 mg by mouth daily.     Allergies:   Statins   Social History   Socioeconomic History   Marital status: Married    Spouse name: Not on file   Number of children: 0   Years of education: Not on file   Highest education level: Not on file  Occupational History   Occupation: Engineering geologist  Tobacco Use   Smoking status: Never   Smokeless tobacco: Never  Vaping Use   Vaping Use: Never used  Substance and Sexual Activity   Alcohol use: No   Drug use: No   Sexual activity: Not on file  Other Topics Concern   Not on file  Social History Narrative   Not on file   Social Determinants of Health   Financial Resource Strain: Not on file  Food Insecurity: Not on file   Transportation Needs: Not on file  Physical Activity: Not on file  Stress: Not on file  Social Connections: Not on file     Family History: The patient's family history includes Alzheimer's disease in his mother; Diabetes Mellitus II in his father; Heart attack in his mother; Stroke in his father and mother. ROS:   Please see the history of present illness.    All 14 point review of systems negative except as described per history of present illness  EKGs/Labs/Other Studies Reviewed:      Recent Labs: No results found for requested labs within last 8760 hours.  Recent Lipid Panel    Component Value Date/Time   CHOL 132 11/13/2018 0924   TRIG 132 11/13/2018 0924   HDL 34 (L) 11/13/2018 0924   CHOLHDL 3.9 11/13/2018 0924   LDLCALC 74 11/13/2018 0924    Physical Exam:    VS:  BP 112/60 (BP Location: Left Arm, Patient Position: Sitting)   Pulse 78   Ht '5\' 10"'$  (1.778 m)   Wt 204 lb 3.2 oz (92.6 kg)   SpO2 96%   BMI 29.30 kg/m     Wt Readings from Last 3 Encounters:  05/23/21 204 lb 3.2 oz (92.6 kg)  05/14/21 206 lb (93.4 kg)  11/21/20 211 lb 12.8 oz (96.1 kg)     GEN:  Well nourished, well developed in no acute distress HEENT: Normal NECK: No JVD; No carotid bruits LYMPHATICS: No lymphadenopathy CARDIAC: RRR, diastolic murmur grade 3/6 best heard left border of sternum, no rubs, no gallops RESPIRATORY:  Clear to auscultation without rales, wheezing or rhonchi  ABDOMEN: Soft, non-tender, non-distended MUSCULOSKELETAL:  No edema; No deformity  SKIN: Warm and dry LOWER EXTREMITIES: no swelling NEUROLOGIC:  Alert and oriented x 3 PSYCHIATRIC:  Normal affect   ASSESSMENT:    1. Dilated cardiomyopathy ([place])   2. Aneurysm of ascending aorta without rupture ([place])   3. Mixed hyperlipidemia    PLAN:    In order of problems listed above:  Dilated cardiomyopathy ejection fraction 45 to 50% last evaluation.  He is on appropriate medications which I will continue.   He does have minimal elevated creatinine on small dose of losartan which I will continue.  No signs and symptoms of congestive heart failure. Ascending aortic aneurysm.  For back surgery is doing well stable Mixed dyslipidemia I did review K PN which show LDL 63 HDL 37.  On appropriate medical therapy we will continue Aortic insufficiency we will schedule him to have an echocardiogram done in few  months.  See him back in 6 months   Medication Adjustments/Labs and Tests Ordered: Current medicines are reviewed at length with the patient today.  Concerns regarding medicines are outlined above.  No orders of the defined types were placed in this encounter.  Medication changes: No orders of the defined types were placed in this encounter.   Signed, Park Liter, MD, [facility] 05/23/2021 1:17 PM    [place]

## 2021-06-02 DIAGNOSIS — I7121 Aneurysm of the ascending aorta, without rupture: Secondary | ICD-10-CM | POA: Diagnosis not present

## 2021-06-02 DIAGNOSIS — I6381 Other cerebral infarction due to occlusion or stenosis of small artery: Secondary | ICD-10-CM | POA: Diagnosis not present

## 2021-06-02 DIAGNOSIS — R0602 Shortness of breath: Secondary | ICD-10-CM | POA: Diagnosis not present

## 2021-06-02 DIAGNOSIS — R000 Tachycardia, unspecified: Secondary | ICD-10-CM | POA: Diagnosis not present

## 2021-06-02 DIAGNOSIS — R7989 Other specified abnormal findings of blood chemistry: Secondary | ICD-10-CM | POA: Diagnosis not present

## 2021-06-02 DIAGNOSIS — I959 Hypotension, unspecified: Secondary | ICD-10-CM | POA: Diagnosis not present

## 2021-06-02 DIAGNOSIS — J9811 Atelectasis: Secondary | ICD-10-CM | POA: Diagnosis not present

## 2021-06-02 DIAGNOSIS — I4891 Unspecified atrial fibrillation: Secondary | ICD-10-CM | POA: Diagnosis not present

## 2021-06-02 DIAGNOSIS — R531 Weakness: Secondary | ICD-10-CM | POA: Diagnosis not present

## 2021-06-02 DIAGNOSIS — R0902 Hypoxemia: Secondary | ICD-10-CM | POA: Diagnosis not present

## 2021-06-02 DIAGNOSIS — R55 Syncope and collapse: Secondary | ICD-10-CM | POA: Diagnosis not present

## 2021-06-03 DIAGNOSIS — N400 Enlarged prostate without lower urinary tract symptoms: Secondary | ICD-10-CM | POA: Diagnosis not present

## 2021-06-03 DIAGNOSIS — R7989 Other specified abnormal findings of blood chemistry: Secondary | ICD-10-CM | POA: Diagnosis not present

## 2021-06-03 DIAGNOSIS — I7121 Aneurysm of the ascending aorta, without rupture: Secondary | ICD-10-CM | POA: Diagnosis not present

## 2021-06-03 DIAGNOSIS — E785 Hyperlipidemia, unspecified: Secondary | ICD-10-CM | POA: Diagnosis not present

## 2021-06-03 DIAGNOSIS — G4733 Obstructive sleep apnea (adult) (pediatric): Secondary | ICD-10-CM | POA: Diagnosis not present

## 2021-06-03 DIAGNOSIS — I482 Chronic atrial fibrillation, unspecified: Secondary | ICD-10-CM | POA: Diagnosis not present

## 2021-06-03 DIAGNOSIS — Z9225 Personal history of immunosupression therapy: Secondary | ICD-10-CM | POA: Diagnosis not present

## 2021-06-03 DIAGNOSIS — M1990 Unspecified osteoarthritis, unspecified site: Secondary | ICD-10-CM | POA: Diagnosis not present

## 2021-06-03 DIAGNOSIS — R531 Weakness: Secondary | ICD-10-CM | POA: Diagnosis not present

## 2021-06-03 DIAGNOSIS — C7951 Secondary malignant neoplasm of bone: Secondary | ICD-10-CM | POA: Diagnosis not present

## 2021-06-03 DIAGNOSIS — I340 Nonrheumatic mitral (valve) insufficiency: Secondary | ICD-10-CM | POA: Diagnosis not present

## 2021-06-03 DIAGNOSIS — I6381 Other cerebral infarction due to occlusion or stenosis of small artery: Secondary | ICD-10-CM | POA: Diagnosis not present

## 2021-06-03 DIAGNOSIS — D649 Anemia, unspecified: Secondary | ICD-10-CM | POA: Diagnosis not present

## 2021-06-03 DIAGNOSIS — C61 Malignant neoplasm of prostate: Secondary | ICD-10-CM | POA: Diagnosis not present

## 2021-06-03 DIAGNOSIS — Z7901 Long term (current) use of anticoagulants: Secondary | ICD-10-CM | POA: Diagnosis not present

## 2021-06-03 DIAGNOSIS — I4891 Unspecified atrial fibrillation: Secondary | ICD-10-CM | POA: Diagnosis not present

## 2021-06-03 DIAGNOSIS — I472 Ventricular tachycardia, unspecified: Secondary | ICD-10-CM | POA: Diagnosis not present

## 2021-06-03 DIAGNOSIS — Z79899 Other long term (current) drug therapy: Secondary | ICD-10-CM | POA: Diagnosis not present

## 2021-06-03 DIAGNOSIS — J9811 Atelectasis: Secondary | ICD-10-CM | POA: Diagnosis not present

## 2021-06-03 DIAGNOSIS — E7800 Pure hypercholesterolemia, unspecified: Secondary | ICD-10-CM | POA: Diagnosis not present

## 2021-06-03 DIAGNOSIS — I351 Nonrheumatic aortic (valve) insufficiency: Secondary | ICD-10-CM | POA: Diagnosis not present

## 2021-06-03 DIAGNOSIS — I10 Essential (primary) hypertension: Secondary | ICD-10-CM | POA: Diagnosis not present

## 2021-06-03 DIAGNOSIS — I080 Rheumatic disorders of both mitral and aortic valves: Secondary | ICD-10-CM | POA: Diagnosis not present

## 2021-06-03 DIAGNOSIS — R0602 Shortness of breath: Secondary | ICD-10-CM | POA: Diagnosis not present

## 2021-06-05 DIAGNOSIS — E785 Hyperlipidemia, unspecified: Secondary | ICD-10-CM | POA: Diagnosis not present

## 2021-06-05 DIAGNOSIS — C61 Malignant neoplasm of prostate: Secondary | ICD-10-CM | POA: Diagnosis not present

## 2021-06-05 DIAGNOSIS — I10 Essential (primary) hypertension: Secondary | ICD-10-CM | POA: Diagnosis not present

## 2021-06-05 DIAGNOSIS — I4891 Unspecified atrial fibrillation: Secondary | ICD-10-CM | POA: Diagnosis not present

## 2021-06-06 DIAGNOSIS — I10 Essential (primary) hypertension: Secondary | ICD-10-CM | POA: Diagnosis not present

## 2021-06-06 DIAGNOSIS — I4891 Unspecified atrial fibrillation: Secondary | ICD-10-CM | POA: Diagnosis not present

## 2021-06-06 DIAGNOSIS — C61 Malignant neoplasm of prostate: Secondary | ICD-10-CM | POA: Diagnosis not present

## 2021-06-06 DIAGNOSIS — E785 Hyperlipidemia, unspecified: Secondary | ICD-10-CM | POA: Diagnosis not present

## 2021-06-08 ENCOUNTER — Telehealth: Payer: Self-pay | Admitting: Cardiology

## 2021-06-08 NOTE — Telephone Encounter (Signed)
Patient's wife called to schedule a hospital follow up appt, she states he needs to be seen soon as his medications were changed. He was at [facility].

## 2021-06-09 DIAGNOSIS — Z7901 Long term (current) use of anticoagulants: Secondary | ICD-10-CM | POA: Diagnosis not present

## 2021-06-09 DIAGNOSIS — C7951 Secondary malignant neoplasm of bone: Secondary | ICD-10-CM | POA: Diagnosis not present

## 2021-06-09 DIAGNOSIS — D6869 Other thrombophilia: Secondary | ICD-10-CM | POA: Diagnosis not present

## 2021-06-09 DIAGNOSIS — Z91148 Patient's other noncompliance with medication regimen for other reason: Secondary | ICD-10-CM | POA: Diagnosis not present

## 2021-06-09 DIAGNOSIS — I480 Paroxysmal atrial fibrillation: Secondary | ICD-10-CM | POA: Diagnosis not present

## 2021-06-09 DIAGNOSIS — C61 Malignant neoplasm of prostate: Secondary | ICD-10-CM | POA: Diagnosis not present

## 2021-06-11 ENCOUNTER — Other Ambulatory Visit: Payer: Self-pay

## 2021-06-11 DIAGNOSIS — I2699 Other pulmonary embolism without acute cor pulmonale: Secondary | ICD-10-CM | POA: Insufficient documentation

## 2021-06-11 DIAGNOSIS — I351 Nonrheumatic aortic (valve) insufficiency: Secondary | ICD-10-CM | POA: Insufficient documentation

## 2021-06-11 DIAGNOSIS — J310 Chronic rhinitis: Secondary | ICD-10-CM | POA: Insufficient documentation

## 2021-06-11 DIAGNOSIS — N400 Enlarged prostate without lower urinary tract symptoms: Secondary | ICD-10-CM | POA: Insufficient documentation

## 2021-06-13 ENCOUNTER — Encounter: Payer: Self-pay | Admitting: Cardiology

## 2021-06-13 ENCOUNTER — Ambulatory Visit: Payer: PPO | Admitting: Cardiology

## 2021-06-13 VITALS — BP 132/50 | HR 90 | Ht 70.0 in | Wt 202.8 lb

## 2021-06-13 DIAGNOSIS — I10 Essential (primary) hypertension: Secondary | ICD-10-CM

## 2021-06-13 DIAGNOSIS — I4819 Other persistent atrial fibrillation: Secondary | ICD-10-CM

## 2021-06-13 DIAGNOSIS — I2510 Atherosclerotic heart disease of native coronary artery without angina pectoris: Secondary | ICD-10-CM | POA: Diagnosis not present

## 2021-06-13 DIAGNOSIS — I7111 Aneurysm of the ascending aorta, ruptured: Secondary | ICD-10-CM | POA: Diagnosis not present

## 2021-06-13 DIAGNOSIS — E782 Mixed hyperlipidemia: Secondary | ICD-10-CM | POA: Diagnosis not present

## 2021-06-13 DIAGNOSIS — Z86711 Personal history of pulmonary embolism: Secondary | ICD-10-CM

## 2021-06-13 DIAGNOSIS — C61 Malignant neoplasm of prostate: Secondary | ICD-10-CM | POA: Diagnosis not present

## 2021-06-13 DIAGNOSIS — I4891 Unspecified atrial fibrillation: Secondary | ICD-10-CM | POA: Diagnosis not present

## 2021-06-13 MED ORDER — DILTIAZEM HCL ER COATED BEADS 180 MG PO CP24: 180.0000 mg | ORAL_CAPSULE | Freq: Every day | ORAL | 3 refills | Status: DC

## 2021-06-13 NOTE — Patient Instructions (Addendum)
Medication Instructions:  Your physician has recommended you make the following change in your medication:   Stop Cardizem 60  Start Cardizem CD 180 mg daily.  *If you need a refill on your cardiac medications before your next appointment, please call your pharmacy*   Lab Work: None ordered If you have labs (blood work) drawn today and your tests are completely normal, you will receive your results only by: [place] (if you have MyChart) OR A paper copy in the mail If you have any lab test that is abnormal or we need to change your treatment, we will call you to review the results.   Testing/Procedures: None ordered   Follow-Up: At [facility], you and your health needs are our priority.  As part of our continuing mission to provide you with exceptional heart care, we have created designated Provider Care Teams.  These Care Teams include your primary Cardiologist (physician) and Advanced Practice Providers (APPs -  Physician Assistants and Nurse Practitioners) who all work together to provide you with the care you need, when you need it.  We recommend signing up for the patient portal called "MyChart".  Sign up information is provided on this After Visit Summary.  MyChart is used to connect with patients for Virtual Visits (Telemedicine).  Patients are able to view lab/test results, encounter notes, upcoming appointments, etc.  Non-urgent messages can be sent to your provider as well.   To learn more about what you can do with MyChart, go to [email or web address].    Your next appointment:   2 month(s)  The format for your next appointment:   In Person  Provider:   Jyl Heinz, MD   Other Instructions NA

## 2021-06-13 NOTE — Progress Notes (Signed)
Cardiology Office Note:    Date:  06/13/2021   ID:  Aaron Richmond, DOB [birth year], MRN [id number]  PCP:  [name], MD  Cardiologist:  Jenean Lindau, MD   Referring MD: [name], MD    ASSESSMENT:    1. Persistent atrial fibrillation ([place])   2. Primary hypertension   3. Ruptured aneurysm of ascending aorta ([place])   4. Coronary artery calcification seen on CT scan   5. Mixed hyperlipidemia   6. History of pulmonary embolism    PLAN:    In order of problems listed above:  Coronary artery calcification: Secondary prevention stressed with the patient.  Importance of compliance with diet medication stressed and vocalized understanding. Persistent atrial fibrillation:I discussed with the patient atrial fibrillation, disease process. Management and therapy including rate and rhythm control, anticoagulation benefits and potential risks were discussed extensively with the patient. Patient had multiple questions which were answered to patient's satisfaction.  I changed his medication medication to Cardizem CD 180 mg daily and it will help him to be compliant with this process.  He will keep a track of his pulse and blood pressures and get back to [place] in the next week to 2. Essential hypertension: Blood pressure is stable and diet was emphasized. Ascending aortic aneurysm: Stable and the report was discussed with him at length. Mild aortic regurgitation: Stable Obstructive sleep apnea: Sleep health issues were discussed. Patient will be seen in follow-up appointment in 2 months or earlier if the patient has any concerns    Medication Adjustments/Labs and Tests Ordered: Current medicines are reviewed at length with the patient today.  Concerns regarding medicines are outlined above.  No orders of the defined types were placed in this encounter.  No orders of the defined types were placed in this encounter.    No chief complaint on file.    History of Present Illness:     Aaron Richmond is a 76 y.o. male.  Patient has past medical history of aneurysm of the aortic root monitored, essential hypertension, persistent atrial fibrillation and history of cardiomyopathy.  He denies any problems at this time and takes care of activities of daily living.  He has some weakness in his legs and was kind of excited about this.  For this reason he went to the hospital and was found to be in atrial fibrillation with elevated ventricular rate.  He was treated and discharged I reviewed those records ejection fraction is normal and he is happy about it now.  Past Medical History:  Diagnosis Date   Aneurysm of aortic root ([place])    Overview:  Last Assessment & Plan:  Planning to see Dr [name] for evaluation of 5cm aneurysm.  - will check PFT in prep for possible procedure / SGY   Aortic regurgitation    Aortic valve insufficiency    Ascending aortic aneurysm (HCC) 09/11/2016   Atrial fibrillation (HCC)    BPH (benign prostatic hyperplasia)    Cardiomyopathy ([place]) 09/11/2016   Coronary artery calcification seen on CT scan 10/13/2017   History of pulmonary embolism 09/24/2012   Overview:  Last Assessment & Plan:  Hx PE x 2 per notes, ? 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  - needs auto-titration study by [employer] or local company, data to RB to adjust his home device.    Preoperative clearance 10/13/2017   Pulmonary embolism (HCC)    Rhinitis     Past Surgical History:  Procedure Laterality Date   APPENDECTOMY     HERNIA  REPAIR     TONSILLECTOMY      Current Medications: Current Meds  Medication Sig   acetaminophen (TYLENOL) 500 MG tablet Take 500 mg by mouth every 6 (six) hours as needed for mild pain or moderate pain.   b complex vitamins capsule Take 1 capsule by mouth daily. Unknown strength   dapagliflozin propanediol (FARXIGA) 5 MG TABS tablet Take 5 mg by mouth daily.   diltiazem (CARDIZEM) 60 MG tablet Take 60 mg by mouth 3 (three) times daily.   Enzalutamide (XTANDI PO) Take 1 tablet by mouth daily.   folic acid (FOLVITE) 546 MCG tablet Take 400 mcg by mouth daily.   levocetirizine (XYZAL) 5 MG tablet Take 5 mg by mouth every evening.   losartan (COZAAR) 25 MG tablet Take 1 tablet (25 mg total) by mouth daily.   meloxicam (MOBIC) 7.5 MG tablet Take 7.5 mg by mouth 2 (two) times daily.   metoprolol tartrate (LOPRESSOR) 50 MG tablet Take 50 mg by mouth 2 (two) times daily.   Multiple Vitamin (MULTIVITAMINS PO) Take 1 tablet by mouth daily. Unknown strength   NETTLE, URTICA DIOICA, PO Take 1 tablet by mouth daily. Unknown strenght   Omega-3 1000 MG CAPS Take 1,200 mg by mouth daily.   Potassium 99 MG TABS Take 1 tablet by mouth daily.   ranitidine (ZANTAC) 75 MG tablet Take 75 mg by mouth 2 (two) times daily.   rivaroxaban (XARELTO) 20 MG TABS tablet TAKE 1 TABLET BY MOUTH ONCE DAILY WITH SUPPER   tamsulosin (FLOMAX) 0.4 MG CAPS capsule Take 0.4 mg by mouth daily after supper.   TART CHERRY PO Take 1 tablet by mouth daily. Unknown strenght   UNABLE TO FIND Take 2 tablets by mouth daily. Med Name: Curamin/ Unknown strength   zinc gluconate 50 MG tablet Take 50 mg by mouth daily.     Allergies:   Statins   Social History   Socioeconomic History   Marital status: Married    Spouse name: Not on file   Number of children: 0   Years of education: Not on file   Highest education level: Not on file  Occupational History   Occupation: Engineering geologist  Tobacco Use   Smoking status: Never    Smokeless tobacco: Never  Vaping Use   Vaping Use: Never used  Substance and Sexual Activity   Alcohol use: No   Drug use: No   Sexual activity: Not on file  Other Topics Concern   Not on file  Social History Narrative   Not on file   Social Determinants of Health   Financial Resource Strain: Not on file  Food Insecurity: Not on file  Transportation Needs: Not on file  Physical Activity: Not on file  Stress: Not on file  Social Connections: Not on file     Family History: The patient's family history includes Alzheimer's disease in his mother; Diabetes Mellitus II in his father; Heart attack in his mother;  Stroke in his father and mother.  ROS:   Please see the history of present illness.    All other systems reviewed and are negative.  EKGs/Labs/Other Studies Reviewed:    The following studies were reviewed today: I discussed my findings with the patient at extensive length.  EKG reveals atrial fibrillation with well-controlled ventricular rate and was done at his primary care office today.   Recent Labs: No results found for requested labs within last 8760 hours.  Recent Lipid Panel    Component Value Date/Time   CHOL 132 11/13/2018 0924   TRIG 132 11/13/2018 0924   HDL 34 (L) 11/13/2018 0924   CHOLHDL 3.9 11/13/2018 0924   LDLCALC 74 11/13/2018 0924    Physical Exam:    VS:  BP (!) 132/50   Pulse 90   Ht '5\' 10"'$  (1.778 m)   Wt 202 lb 12.8 oz (92 kg)   SpO2 97%   BMI 29.10 kg/m     Wt Readings from Last 3 Encounters:  06/13/21 202 lb 12.8 oz (92 kg)  05/23/21 204 lb 3.2 oz (92.6 kg)  05/14/21 206 lb (93.4 kg)     GEN: Patient is in no acute distress HEENT: Normal NECK: No JVD; No carotid bruits LYMPHATICS: No lymphadenopathy CARDIAC: Hear sounds irregular, 2/6 systolic murmur at the apex. RESPIRATORY:  Clear to auscultation without rales, wheezing or rhonchi  ABDOMEN: Soft, non-tender, non-distended MUSCULOSKELETAL:  No edema; No deformity   SKIN: Warm and dry NEUROLOGIC:  Alert and oriented x 3 PSYCHIATRIC:  Normal affect   Signed, Jenean Lindau, MD  06/13/2021 4:30 PM    [facility]

## 2021-06-27 ENCOUNTER — Other Ambulatory Visit: Payer: Self-pay | Admitting: Cardiology

## 2021-06-27 DIAGNOSIS — I4819 Other persistent atrial fibrillation: Secondary | ICD-10-CM

## 2021-07-05 DIAGNOSIS — J0110 Acute frontal sinusitis, unspecified: Secondary | ICD-10-CM | POA: Diagnosis not present

## 2021-07-05 DIAGNOSIS — R21 Rash and other nonspecific skin eruption: Secondary | ICD-10-CM | POA: Diagnosis not present

## 2021-07-07 DIAGNOSIS — I4891 Unspecified atrial fibrillation: Secondary | ICD-10-CM | POA: Diagnosis not present

## 2021-07-11 ENCOUNTER — Telehealth: Payer: Self-pay | Admitting: Cardiology

## 2021-07-11 ENCOUNTER — Other Ambulatory Visit: Payer: Self-pay

## 2021-07-11 MED ORDER — METOPROLOL TARTRATE 50 MG PO TABS: 50.0000 mg | ORAL_TABLET | Freq: Two times a day (BID) | ORAL | 3 refills | Status: DC

## 2021-07-11 NOTE — Telephone Encounter (Signed)
*  STAT* If patient is at the pharmacy, call can be transferred to refill team.   1. Which medications need to be refilled? (please list name of each medication and dose if known) metoprolol tartrate (LOPRESSOR) 50 MG tablet  2. Which pharmacy/location (including street and city if local pharmacy) is medication to be sent to? [place], [place]  3. Do they need a 30 day or 90 day supply? [place]

## 2021-07-11 NOTE — Telephone Encounter (Signed)
Called patient and confirmed that he only needed the metoprolol tartrate refilled. Patient confirmed this and the refill was sent to the patient's pharmacy via [place]. Patient had no further questions at this time.

## 2021-07-26 DIAGNOSIS — M9903 Segmental and somatic dysfunction of lumbar region: Secondary | ICD-10-CM | POA: Diagnosis not present

## 2021-07-26 DIAGNOSIS — M5387 Other specified dorsopathies, lumbosacral region: Secondary | ICD-10-CM | POA: Diagnosis not present

## 2021-07-26 DIAGNOSIS — M9905 Segmental and somatic dysfunction of pelvic region: Secondary | ICD-10-CM | POA: Diagnosis not present

## 2021-07-26 DIAGNOSIS — M9902 Segmental and somatic dysfunction of thoracic region: Secondary | ICD-10-CM | POA: Diagnosis not present

## 2021-07-26 DIAGNOSIS — M7918 Myalgia, other site: Secondary | ICD-10-CM | POA: Diagnosis not present

## 2021-07-26 DIAGNOSIS — M461 Sacroiliitis, not elsewhere classified: Secondary | ICD-10-CM | POA: Diagnosis not present

## 2021-07-31 DIAGNOSIS — M461 Sacroiliitis, not elsewhere classified: Secondary | ICD-10-CM | POA: Diagnosis not present

## 2021-07-31 DIAGNOSIS — M9902 Segmental and somatic dysfunction of thoracic region: Secondary | ICD-10-CM | POA: Diagnosis not present

## 2021-07-31 DIAGNOSIS — M5387 Other specified dorsopathies, lumbosacral region: Secondary | ICD-10-CM | POA: Diagnosis not present

## 2021-07-31 DIAGNOSIS — M7918 Myalgia, other site: Secondary | ICD-10-CM | POA: Diagnosis not present

## 2021-07-31 DIAGNOSIS — M9903 Segmental and somatic dysfunction of lumbar region: Secondary | ICD-10-CM | POA: Diagnosis not present

## 2021-07-31 DIAGNOSIS — M9905 Segmental and somatic dysfunction of pelvic region: Secondary | ICD-10-CM | POA: Diagnosis not present

## 2021-08-06 DIAGNOSIS — M7918 Myalgia, other site: Secondary | ICD-10-CM | POA: Diagnosis not present

## 2021-08-06 DIAGNOSIS — M461 Sacroiliitis, not elsewhere classified: Secondary | ICD-10-CM | POA: Diagnosis not present

## 2021-08-06 DIAGNOSIS — M9902 Segmental and somatic dysfunction of thoracic region: Secondary | ICD-10-CM | POA: Diagnosis not present

## 2021-08-06 DIAGNOSIS — M9905 Segmental and somatic dysfunction of pelvic region: Secondary | ICD-10-CM | POA: Diagnosis not present

## 2021-08-06 DIAGNOSIS — M5387 Other specified dorsopathies, lumbosacral region: Secondary | ICD-10-CM | POA: Diagnosis not present

## 2021-08-06 DIAGNOSIS — M9903 Segmental and somatic dysfunction of lumbar region: Secondary | ICD-10-CM | POA: Diagnosis not present

## 2021-08-07 ENCOUNTER — Ambulatory Visit (INDEPENDENT_AMBULATORY_CARE_PROVIDER_SITE_OTHER): Payer: PPO

## 2021-08-07 DIAGNOSIS — I7121 Aneurysm of the ascending aorta, without rupture: Secondary | ICD-10-CM

## 2021-08-07 DIAGNOSIS — I420 Dilated cardiomyopathy: Secondary | ICD-10-CM | POA: Diagnosis not present

## 2021-08-07 LAB — ECHOCARDIOGRAM COMPLETE
Area-P 1/2: 3.46 cm2
Calc EF: 46.1 %
MV M vel: 4.74 m/s
MV Peak grad: 89.9 mmHg
P 1/2 time: 296 msec
Radius: 0.8 cm
S' Lateral: 4.5 cm
Single Plane A2C EF: 41.3 %
Single Plane A4C EF: 48.2 %

## 2021-08-08 ENCOUNTER — Telehealth: Payer: Self-pay

## 2021-08-08 DIAGNOSIS — M461 Sacroiliitis, not elsewhere classified: Secondary | ICD-10-CM | POA: Diagnosis not present

## 2021-08-08 DIAGNOSIS — M7918 Myalgia, other site: Secondary | ICD-10-CM | POA: Diagnosis not present

## 2021-08-08 DIAGNOSIS — M9905 Segmental and somatic dysfunction of pelvic region: Secondary | ICD-10-CM | POA: Diagnosis not present

## 2021-08-08 DIAGNOSIS — M9903 Segmental and somatic dysfunction of lumbar region: Secondary | ICD-10-CM | POA: Diagnosis not present

## 2021-08-08 DIAGNOSIS — M9902 Segmental and somatic dysfunction of thoracic region: Secondary | ICD-10-CM | POA: Diagnosis not present

## 2021-08-08 DIAGNOSIS — M5387 Other specified dorsopathies, lumbosacral region: Secondary | ICD-10-CM | POA: Diagnosis not present

## 2021-08-08 NOTE — Telephone Encounter (Signed)
Results reviewed with pt as per Dr. Krasowski's note.  Pt verbalized understanding and had no additional questions. Routed to PCP  

## 2021-08-13 DIAGNOSIS — M461 Sacroiliitis, not elsewhere classified: Secondary | ICD-10-CM | POA: Diagnosis not present

## 2021-08-13 DIAGNOSIS — M7918 Myalgia, other site: Secondary | ICD-10-CM | POA: Diagnosis not present

## 2021-08-13 DIAGNOSIS — M9905 Segmental and somatic dysfunction of pelvic region: Secondary | ICD-10-CM | POA: Diagnosis not present

## 2021-08-13 DIAGNOSIS — M5387 Other specified dorsopathies, lumbosacral region: Secondary | ICD-10-CM | POA: Diagnosis not present

## 2021-08-13 DIAGNOSIS — M9903 Segmental and somatic dysfunction of lumbar region: Secondary | ICD-10-CM | POA: Diagnosis not present

## 2021-08-13 DIAGNOSIS — M9902 Segmental and somatic dysfunction of thoracic region: Secondary | ICD-10-CM | POA: Diagnosis not present

## 2021-08-14 ENCOUNTER — Ambulatory Visit: Payer: PPO | Admitting: Cardiology

## 2021-08-15 DIAGNOSIS — M9905 Segmental and somatic dysfunction of pelvic region: Secondary | ICD-10-CM | POA: Diagnosis not present

## 2021-08-15 DIAGNOSIS — M5387 Other specified dorsopathies, lumbosacral region: Secondary | ICD-10-CM | POA: Diagnosis not present

## 2021-08-15 DIAGNOSIS — M9903 Segmental and somatic dysfunction of lumbar region: Secondary | ICD-10-CM | POA: Diagnosis not present

## 2021-08-15 DIAGNOSIS — M9902 Segmental and somatic dysfunction of thoracic region: Secondary | ICD-10-CM | POA: Diagnosis not present

## 2021-08-15 DIAGNOSIS — M461 Sacroiliitis, not elsewhere classified: Secondary | ICD-10-CM | POA: Diagnosis not present

## 2021-08-15 DIAGNOSIS — M7918 Myalgia, other site: Secondary | ICD-10-CM | POA: Diagnosis not present

## 2021-08-16 ENCOUNTER — Ambulatory Visit: Payer: PPO | Admitting: Cardiology

## 2021-08-16 ENCOUNTER — Other Ambulatory Visit: Payer: Self-pay

## 2021-08-16 ENCOUNTER — Encounter: Payer: Self-pay | Admitting: Cardiology

## 2021-08-16 DIAGNOSIS — I351 Nonrheumatic aortic (valve) insufficiency: Secondary | ICD-10-CM

## 2021-08-16 DIAGNOSIS — I10 Essential (primary) hypertension: Secondary | ICD-10-CM | POA: Diagnosis not present

## 2021-08-16 DIAGNOSIS — G4733 Obstructive sleep apnea (adult) (pediatric): Secondary | ICD-10-CM | POA: Diagnosis not present

## 2021-08-16 DIAGNOSIS — I7121 Aneurysm of the ascending aorta, without rupture: Secondary | ICD-10-CM | POA: Diagnosis not present

## 2021-08-16 DIAGNOSIS — I429 Cardiomyopathy, unspecified: Secondary | ICD-10-CM | POA: Diagnosis not present

## 2021-08-16 DIAGNOSIS — Z86711 Personal history of pulmonary embolism: Secondary | ICD-10-CM

## 2021-08-16 DIAGNOSIS — E669 Obesity, unspecified: Secondary | ICD-10-CM

## 2021-08-16 MED ORDER — SACUBITRIL-VALSARTAN 24-26 MG PO TABS: 1.0000 | ORAL_TABLET | Freq: Two times a day (BID) | ORAL | Status: DC

## 2021-08-16 NOTE — Patient Instructions (Addendum)
Medication Instructions:  Your physician has recommended you make the following change in your medication:  STOP Losartan on Saturday STOP Diltiazem on Monday START Entresto 24/26 mg tablets on MONDAY   Labwork: Today: BMET In 1 week: BMET  Testing/Procedures: Your physician has requested that you have an echocardiogram. Echocardiography is a painless test that uses sound waves to create images of your heart. It provides your doctor with information about the size and shape of your heart and how well your heart's chambers and valves are working. This procedure takes approximately one hour. There are no restrictions for this procedure.   Follow-Up: Follow up with Dr. [name] in 2 months.   Any Other Special Instructions Will Be Listed Below (If Applicable).  Please keep track of your hear rate and blood pressure and bring log with you to your next lab appointment.    If you need a refill on your cardiac medications before your next appointment, please call your pharmacy.

## 2021-08-16 NOTE — Progress Notes (Signed)
Cardiology Office Note:    Date:  08/16/2021   ID:  Aaron Richmond, DOB 11-Jul-1945, MRN [id number]  PCP:  [name], MD  Cardiologist:  Jenean Lindau, MD   Referring MD: [name], MD    ASSESSMENT:    1. Aortic valve insufficiency, etiology of cardiac valve disease unspecified   2. Primary hypertension   3. Aneurysm of ascending aorta without rupture ([place])   4. History of pulmonary embolism   5. Obstructive sleep apnea syndrome   6. Obesity (BMI 30-39.9)   7. Cardiomyopathy, unspecified type ([place])    PLAN:    In order of problems listed above:  Coronary artery calcification on CT scan: Secondary prevention stressed with the patient.  Importance of compliance with diet and medication stressed and immobilized. Cardiomyopathy: I discussed findings with the patient at length.  He is agreeable for [employer].  I will stop his losartan on Saturday.  He will not take losartan on Saturday.  He will continue Cardizem till Monday morning and he will not take it on Monday morning.  On  that day he will take a low-dose losartan daily.  He will have a Chem-7 today.  He will be back in 1 week for a pulse blood pressure check and Chem-7 in 1 week.  He will have blood pressure log at that time.  See me back in 2 months for a follow-up visit before which she will have an echocardiogram. Essential hypertension: Blood pressure stable and diet was emphasized. Ascending aortic aneurysm: Stable results and I discussed this with the patient at length.  Questions were answered to satisfaction. Valvular heart disease: Mitral and aortic regurgitation: I discussed echo report with him and questions were answered to satisfaction.  Details of echo mentioned below.   Medication Adjustments/Labs and Tests Ordered: Current medicines are reviewed at length with the patient today.  Concerns regarding medicines are outlined above.  No orders of the defined types were placed in this encounter.  No  orders of the defined types were placed in this encounter.    Chief Complaint  Patient presents with   Follow-up     History of Present Illness:    Aaron Richmond is a 76 y.o. male.  Patient has past medical history of aortic regurgitation, mitral regurgitation, ascending aortic aneurysm, essential hypertension and dyslipidemia and obstructive sleep apnea.  He has history of prostate cancer.  He denies any problems at this time and takes care of activities of daily living.  No chest pain orthopnea or PND.  At the time of my evaluation, the patient is alert awake oriented and in no distress.  Past Medical History:  Diagnosis Date   Aneurysm of aortic root ([place])    Overview:  Last Assessment & Plan:  Planning to see Dr [name] for evaluation of 5cm aneurysm.  - will check PFT in prep for possible procedure / SGY   Aortic regurgitation    Aortic valve insufficiency    Ascending aortic aneurysm (HCC) 09/11/2016   Atrial fibrillation (HCC)    BPH (benign prostatic hyperplasia)    Cardiomyopathy ([place]) 09/11/2016   Coronary artery calcification seen on CT scan 10/13/2017   History of pulmonary embolism 09/24/2012   Overview:  Last Assessment & Plan:  Hx PE x 2 per notes, ? 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  - needs auto-titration study by [employer] or local company, data to RB to adjust his home device.    Preoperative clearance 10/13/2017   Pulmonary embolism (HCC)    Rhinitis     Past Surgical History:  Procedure Laterality Date    APPENDECTOMY     HERNIA REPAIR     TONSILLECTOMY      Current Medications: Current Meds  Medication Sig   acetaminophen (TYLENOL) 500 MG tablet Take 500 mg by mouth every 6 (six) hours as needed for mild pain or moderate pain.   b complex vitamins capsule Take 1 capsule by mouth daily. Unknown strength   dapagliflozin propanediol (FARXIGA) 5 MG TABS tablet Take 5 mg by mouth daily.   diltiazem (CARDIZEM CD) 180 MG 24 hr capsule Take 1 capsule (180 mg total) by mouth daily.   Enzalutamide (XTANDI PO) Take 1 tablet by mouth daily.   folic acid (FOLVITE) 195 MCG tablet Take 400 mcg by mouth daily.   levocetirizine (XYZAL) 5 MG tablet Take 5 mg by mouth every evening.   losartan (COZAAR) 25 MG tablet Take 1 tablet (25 mg total) by mouth daily.   meloxicam (MOBIC) 7.5 MG tablet Take 7.5 mg by mouth 2 (two) times daily.   metoprolol tartrate (LOPRESSOR) 50 MG tablet Take 1 tablet (50 mg total) by mouth 2 (two) times daily.   Multiple Vitamin (MULTIVITAMINS PO) Take 1 tablet by mouth daily. Unknown strength   NETTLE, URTICA DIOICA, PO Take 1 tablet by mouth daily. Unknown strenght   Omega-3 1000 MG CAPS Take 1,200 mg by mouth daily.   Potassium 99 MG TABS Take 1 tablet by mouth daily.   ranitidine (ZANTAC) 75 MG tablet Take 75 mg by mouth 2 (two) times daily.   rosuvastatin (CRESTOR) 5 MG tablet Take 1 tablet (5 mg total) by mouth daily.   tamsulosin (FLOMAX) 0.4 MG CAPS capsule Take 0.4 mg by mouth daily after supper.   TART CHERRY PO Take 1 tablet by mouth daily. Unknown strenght   UNABLE TO FIND Take 2 tablets by mouth daily. Med Name: Curamin/ Unknown strength   XARELTO 20 MG TABS tablet TAKE 1 TABLET BY MOUTH ONCE DAILY WITH SUPPER   zinc gluconate 50 MG tablet Take 50 mg by mouth daily.     Allergies:   Statins   Social History   Socioeconomic History   Marital status: Married    Spouse name: Not on file   Number of children: 0   Years of education: Not on file   Highest  education level: Not on file  Occupational History   Occupation: Engineering geologist  Tobacco Use   Smoking status: Never   Smokeless tobacco: Never  Vaping Use   Vaping Use: Never used  Substance and Sexual Activity   Alcohol use: No   Drug use: No   Sexual activity: Not on file  Other Topics Concern   Not on file  Social History Narrative   Not on file   Social Determinants of Health   Financial Resource Strain: Not on file  Food Insecurity: Not on file  Transportation Needs: Not on file  Physical Activity: Not on  file  Stress: Not on file  Social Connections: Not on file     Family History: The patient's family history includes Alzheimer's disease in his mother; Diabetes Mellitus II in his father; Heart attack in his mother; Stroke in his father and mother.  ROS:   Please see the history of present illness.    All other systems reviewed and are negative.  EKGs/Labs/Other Studies Reviewed:    The following studies were reviewed today: I discussed my findings with the patient at length.  IMPRESSIONS     1. Left ventricular ejection fraction, by estimation, is 40 to 45%. The  left ventricle has mild to moderately decreased function. The left  ventricle demonstrates global hypokinesis. The left ventricular internal  cavity size was mildly dilated. There is  moderate concentric left ventricular hypertrophy. Left ventricular  diastolic parameters are indeterminate. The average left ventricular  global longitudinal strain is -8.7 %. The global longitudinal strain is  abnormal.   2. Right ventricular systolic function is normal. The right ventricular  size is normal. There is normal pulmonary artery systolic pressure.   3. Left atrial size was severely dilated.   4. The mitral valve is degenerative. Moderate mitral valve regurgitation.  No evidence of mitral stenosis.   5. The aortic valve is tricuspid. Aortic valve regurgitation is moderate.  No aortic stenosis is  present.   6. Aortic Normal DTA. There is severe dilatation of the ascending aorta  and of the aortic root, measuring 53 mm.   7. The inferior vena cava is normal in size with greater than 50%  respiratory variability, suggesting right atrial pressure of 3 mmHg.    Recent Labs: No results found for requested labs within last 365 days.  Recent Lipid Panel    Component Value Date/Time   CHOL 132 11/13/2018 0924   TRIG 132 11/13/2018 0924   HDL 34 (L) 11/13/2018 0924   CHOLHDL 3.9 11/13/2018 0924   LDLCALC 74 11/13/2018 0924    Physical Exam:    VS:  BP 120/60 (BP Location: Right Arm, Patient Position: Sitting, Cuff Size: Normal)   Pulse 72   Ht '5\' 10"'$  (1.778 m)   Wt 214 lb (97.1 kg)   SpO2 94%   BMI 30.71 kg/m     Wt Readings from Last 3 Encounters:  08/16/21 214 lb (97.1 kg)  06/13/21 202 lb 12.8 oz (92 kg)  05/23/21 204 lb 3.2 oz (92.6 kg)     GEN: Patient is in no acute distress HEENT: Normal NECK: No JVD; No carotid bruits LYMPHATICS: No lymphadenopathy CARDIAC: Hear sounds regular, 2/6 systolic murmur at the apex. RESPIRATORY:  Clear to auscultation without rales, wheezing or rhonchi  ABDOMEN: Soft, non-tender, non-distended MUSCULOSKELETAL:  No edema; No deformity  SKIN: Warm and dry NEUROLOGIC:  Alert and oriented x 3 PSYCHIATRIC:  Normal affect   Signed, Jenean Lindau, MD  08/16/2021 1:13 PM    [facility]

## 2021-08-17 LAB — BASIC METABOLIC PANEL
BUN/Creatinine Ratio: 27 — ABNORMAL HIGH (ref 10–24)
BUN: 27 mg/dL (ref 8–27)
CO2: 24 mmol/L (ref 20–29)
Calcium: 9.3 mg/dL (ref 8.6–10.2)
Chloride: 103 mmol/L (ref 96–106)
Creatinine, Ser: 0.99 mg/dL (ref 0.76–1.27)
Glucose: 126 mg/dL — ABNORMAL HIGH (ref 70–99)
Potassium: 4.2 mmol/L (ref 3.5–5.2)
Sodium: 141 mmol/L (ref 134–144)
eGFR: 79 mL/min/{1.73_m2} (ref >59)

## 2021-08-20 ENCOUNTER — Telehealth: Payer: Self-pay | Admitting: Cardiology

## 2021-08-20 DIAGNOSIS — M9905 Segmental and somatic dysfunction of pelvic region: Secondary | ICD-10-CM | POA: Diagnosis not present

## 2021-08-20 DIAGNOSIS — M7918 Myalgia, other site: Secondary | ICD-10-CM | POA: Diagnosis not present

## 2021-08-20 DIAGNOSIS — M9902 Segmental and somatic dysfunction of thoracic region: Secondary | ICD-10-CM | POA: Diagnosis not present

## 2021-08-20 DIAGNOSIS — M5387 Other specified dorsopathies, lumbosacral region: Secondary | ICD-10-CM | POA: Diagnosis not present

## 2021-08-20 DIAGNOSIS — M461 Sacroiliitis, not elsewhere classified: Secondary | ICD-10-CM | POA: Diagnosis not present

## 2021-08-20 DIAGNOSIS — M9903 Segmental and somatic dysfunction of lumbar region: Secondary | ICD-10-CM | POA: Diagnosis not present

## 2021-08-20 DIAGNOSIS — I429 Cardiomyopathy, unspecified: Secondary | ICD-10-CM

## 2021-08-20 MED ORDER — ENTRESTO 24-26 MG PO TABS: 1.0000 | ORAL_TABLET | Freq: Two times a day (BID) | ORAL | 12 refills | Status: DC

## 2021-08-20 NOTE — Telephone Encounter (Signed)
 *  STAT* If patient is at the pharmacy, call can be transferred to refill team.   1. Which medications need to be refilled? (please list name of each medication and dose if known)   sacubitril-valsartan (ENTRESTO) 24-26 mg per tablet    2. Which pharmacy/location (including street and city if local pharmacy) is medication to be sent to? [place] [facility], [place]  3. Do they need a 30 day or 90 day supply? 90 days  Pt said, his pharmacy did not receive the prescription. Please resend

## 2021-08-21 DIAGNOSIS — H903 Sensorineural hearing loss, bilateral: Secondary | ICD-10-CM | POA: Diagnosis not present

## 2021-08-22 ENCOUNTER — Telehealth: Payer: Self-pay | Admitting: Cardiology

## 2021-08-22 DIAGNOSIS — Z7901 Long term (current) use of anticoagulants: Secondary | ICD-10-CM | POA: Diagnosis not present

## 2021-08-22 DIAGNOSIS — C61 Malignant neoplasm of prostate: Secondary | ICD-10-CM | POA: Diagnosis not present

## 2021-08-22 DIAGNOSIS — D692 Other nonthrombocytopenic purpura: Secondary | ICD-10-CM | POA: Diagnosis not present

## 2021-08-22 DIAGNOSIS — I480 Paroxysmal atrial fibrillation: Secondary | ICD-10-CM | POA: Diagnosis not present

## 2021-08-22 DIAGNOSIS — I10 Essential (primary) hypertension: Secondary | ICD-10-CM | POA: Diagnosis not present

## 2021-08-22 DIAGNOSIS — D6869 Other thrombophilia: Secondary | ICD-10-CM | POA: Diagnosis not present

## 2021-08-22 NOTE — Telephone Encounter (Signed)
Pt is returning call in regards to labs. Requesting call back.  

## 2021-08-22 NOTE — Telephone Encounter (Signed)
Results reviewed with pt as per Dr. Revankar's note.  Pt verbalized understanding and had no additional questions. Routed to PCP.  

## 2021-08-24 ENCOUNTER — Other Ambulatory Visit: Payer: Self-pay | Admitting: Cardiology

## 2021-08-24 DIAGNOSIS — I4819 Other persistent atrial fibrillation: Secondary | ICD-10-CM

## 2021-08-24 DIAGNOSIS — I429 Cardiomyopathy, unspecified: Secondary | ICD-10-CM | POA: Diagnosis not present

## 2021-08-24 DIAGNOSIS — I4891 Unspecified atrial fibrillation: Secondary | ICD-10-CM | POA: Diagnosis not present

## 2021-08-24 NOTE — Telephone Encounter (Signed)
Prescription refill request for Xarelto received.  Indication: afib  Last office visit: [place], 08/16/2021 Weight: 97.1 kg  Age: 76 yo  Scr: 0.8, 06/04/2021 CrCl: 110 ml/min

## 2021-08-27 DIAGNOSIS — M9905 Segmental and somatic dysfunction of pelvic region: Secondary | ICD-10-CM | POA: Diagnosis not present

## 2021-08-27 DIAGNOSIS — M9903 Segmental and somatic dysfunction of lumbar region: Secondary | ICD-10-CM | POA: Diagnosis not present

## 2021-08-27 DIAGNOSIS — M5387 Other specified dorsopathies, lumbosacral region: Secondary | ICD-10-CM | POA: Diagnosis not present

## 2021-08-27 DIAGNOSIS — M9902 Segmental and somatic dysfunction of thoracic region: Secondary | ICD-10-CM | POA: Diagnosis not present

## 2021-08-27 DIAGNOSIS — M461 Sacroiliitis, not elsewhere classified: Secondary | ICD-10-CM | POA: Diagnosis not present

## 2021-08-27 DIAGNOSIS — M7918 Myalgia, other site: Secondary | ICD-10-CM | POA: Diagnosis not present

## 2021-08-30 DIAGNOSIS — M5387 Other specified dorsopathies, lumbosacral region: Secondary | ICD-10-CM | POA: Diagnosis not present

## 2021-08-30 DIAGNOSIS — M7918 Myalgia, other site: Secondary | ICD-10-CM | POA: Diagnosis not present

## 2021-08-30 DIAGNOSIS — M9905 Segmental and somatic dysfunction of pelvic region: Secondary | ICD-10-CM | POA: Diagnosis not present

## 2021-08-30 DIAGNOSIS — M9902 Segmental and somatic dysfunction of thoracic region: Secondary | ICD-10-CM | POA: Diagnosis not present

## 2021-08-30 DIAGNOSIS — M461 Sacroiliitis, not elsewhere classified: Secondary | ICD-10-CM | POA: Diagnosis not present

## 2021-08-30 DIAGNOSIS — M9903 Segmental and somatic dysfunction of lumbar region: Secondary | ICD-10-CM | POA: Diagnosis not present

## 2021-08-31 DIAGNOSIS — M5416 Radiculopathy, lumbar region: Secondary | ICD-10-CM | POA: Diagnosis not present

## 2021-08-31 DIAGNOSIS — I4891 Unspecified atrial fibrillation: Secondary | ICD-10-CM | POA: Diagnosis not present

## 2021-08-31 DIAGNOSIS — M4316 Spondylolisthesis, lumbar region: Secondary | ICD-10-CM | POA: Diagnosis not present

## 2021-09-06 DIAGNOSIS — M9902 Segmental and somatic dysfunction of thoracic region: Secondary | ICD-10-CM | POA: Diagnosis not present

## 2021-09-06 DIAGNOSIS — M461 Sacroiliitis, not elsewhere classified: Secondary | ICD-10-CM | POA: Diagnosis not present

## 2021-09-06 DIAGNOSIS — M9903 Segmental and somatic dysfunction of lumbar region: Secondary | ICD-10-CM | POA: Diagnosis not present

## 2021-09-06 DIAGNOSIS — M5387 Other specified dorsopathies, lumbosacral region: Secondary | ICD-10-CM | POA: Diagnosis not present

## 2021-09-06 DIAGNOSIS — M7918 Myalgia, other site: Secondary | ICD-10-CM | POA: Diagnosis not present

## 2021-09-06 DIAGNOSIS — M9905 Segmental and somatic dysfunction of pelvic region: Secondary | ICD-10-CM | POA: Diagnosis not present

## 2021-09-12 DIAGNOSIS — C61 Malignant neoplasm of prostate: Secondary | ICD-10-CM | POA: Diagnosis not present

## 2021-09-14 DIAGNOSIS — M461 Sacroiliitis, not elsewhere classified: Secondary | ICD-10-CM | POA: Diagnosis not present

## 2021-09-14 DIAGNOSIS — M7918 Myalgia, other site: Secondary | ICD-10-CM | POA: Diagnosis not present

## 2021-09-14 DIAGNOSIS — M9905 Segmental and somatic dysfunction of pelvic region: Secondary | ICD-10-CM | POA: Diagnosis not present

## 2021-09-14 DIAGNOSIS — M9902 Segmental and somatic dysfunction of thoracic region: Secondary | ICD-10-CM | POA: Diagnosis not present

## 2021-09-14 DIAGNOSIS — M9903 Segmental and somatic dysfunction of lumbar region: Secondary | ICD-10-CM | POA: Diagnosis not present

## 2021-09-14 DIAGNOSIS — M5387 Other specified dorsopathies, lumbosacral region: Secondary | ICD-10-CM | POA: Diagnosis not present

## 2021-10-02 DIAGNOSIS — R0602 Shortness of breath: Secondary | ICD-10-CM | POA: Diagnosis not present

## 2021-10-03 DIAGNOSIS — C7951 Secondary malignant neoplasm of bone: Secondary | ICD-10-CM | POA: Diagnosis not present

## 2021-10-03 DIAGNOSIS — C61 Malignant neoplasm of prostate: Secondary | ICD-10-CM | POA: Diagnosis not present

## 2021-10-03 DIAGNOSIS — C775 Secondary and unspecified malignant neoplasm of intrapelvic lymph nodes: Secondary | ICD-10-CM | POA: Diagnosis not present

## 2021-10-09 ENCOUNTER — Ambulatory Visit: Payer: PPO | Attending: Cardiology

## 2021-10-09 DIAGNOSIS — I429 Cardiomyopathy, unspecified: Secondary | ICD-10-CM

## 2021-10-09 DIAGNOSIS — Z86711 Personal history of pulmonary embolism: Secondary | ICD-10-CM

## 2021-10-09 DIAGNOSIS — I351 Nonrheumatic aortic (valve) insufficiency: Secondary | ICD-10-CM

## 2021-10-09 LAB — ECHOCARDIOGRAM COMPLETE
AR max vel: 3.27 cm2
AV Area VTI: 3.74 cm2
AV Area mean vel: 3.29 cm2
AV Mean grad: 8 mmHg
AV Peak grad: 13.4 mmHg
Ao pk vel: 1.83 m/s
MV M vel: 4.95 m/s
MV Peak grad: 98 mmHg
P 1/2 time: 497 msec
Radius: 0.3 cm
S' Lateral: 5.2 cm

## 2021-11-09 ENCOUNTER — Ambulatory Visit: Payer: PPO | Attending: Cardiology | Admitting: Cardiology

## 2021-11-09 ENCOUNTER — Encounter: Payer: Self-pay | Admitting: Cardiology

## 2021-11-09 VITALS — BP 104/50 | HR 84 | Ht 70.0 in | Wt 210.4 lb

## 2021-11-09 DIAGNOSIS — Q2543 Congenital aneurysm of aorta: Secondary | ICD-10-CM

## 2021-11-09 DIAGNOSIS — Z8679 Personal history of other diseases of the circulatory system: Secondary | ICD-10-CM

## 2021-11-09 DIAGNOSIS — I7121 Aneurysm of the ascending aorta, without rupture: Secondary | ICD-10-CM | POA: Diagnosis not present

## 2021-11-09 DIAGNOSIS — I10 Essential (primary) hypertension: Secondary | ICD-10-CM

## 2021-11-09 DIAGNOSIS — I351 Nonrheumatic aortic (valve) insufficiency: Secondary | ICD-10-CM

## 2021-11-09 DIAGNOSIS — Z86711 Personal history of pulmonary embolism: Secondary | ICD-10-CM | POA: Diagnosis not present

## 2021-11-09 DIAGNOSIS — I2510 Atherosclerotic heart disease of native coronary artery without angina pectoris: Secondary | ICD-10-CM

## 2021-11-09 HISTORY — DX: Personal history of other diseases of the circulatory system: Z86.79

## 2021-11-09 NOTE — Progress Notes (Signed)
Cardiology Office Note:    Date:  11/09/2021   ID:  Aaron Richmond, DOB October 05, 1945, MRN [id number]  PCP:  Aaron Roger, MD  Cardiologist:  Aaron Lindau, MD   Referring MD: Aaron Roger, MD    ASSESSMENT:    1. History of cardiomyopathy   2. Coronary artery calcification seen on CT scan   3. Primary hypertension   4. Aneurysm of ascending aorta without rupture ([place])   5. History of pulmonary embolism   6. Aneurysm of aortic root ([place])   7. Aortic valve insufficiency, etiology of cardiac valve disease unspecified    PLAN:    In order of problems listed above:  Primary prevention stressed to the patient.  Importance of of compliance with diet medication stressed any vocalized understanding.  He was advised to walk at least half an hour a day 5 days a week and is going to try to do this. Paroxysmal atrial fibrillation:I discussed with the patient atrial fibrillation, disease process. Management and therapy including rate and rhythm control, anticoagulation benefits and potential risks were discussed extensively with the patient. Patient had multiple questions which were answered to patient's satisfaction. History of pulmonary embolism: On anticoagulation History of cardiomyopathy: On guideline Directed medical therapy.  Last ejection fraction was preserved.  He is happy about this.  He will have a Chem-7 today. Patient will be seen in follow-up appointment in 6 months or earlier if the patient has any concerns.   Medication Adjustments/Labs and Tests Ordered: Current medicines are reviewed at length with the patient today.  Concerns regarding medicines are outlined above.  No orders of the defined types were placed in this encounter.  No orders of the defined types were placed in this encounter.    No chief complaint on file.    History of Present Illness:    Aaron Richmond is a 76 y.o. male.  Patient has past medical history of paroxysmal atrial fibrillation,  history of cardiomyopathy with current ejection fraction normal on guideline directed medical therapy, history of pulmonary embolism and prostate cancer.  Patient denies any problems at this time and takes care of activities of daily living.  No chest pain orthopnea or PND.  At the time of my evaluation, the patient is alert awake oriented and in no distress.  Past Medical History:  Diagnosis Date   Aneurysm of aortic root ([place])    Overview:  Last Assessment & Plan:  Planning to see Dr [name] for evaluation of 5cm aneurysm.  - will check PFT in prep for possible procedure / SGY   Aortic regurgitation    Aortic valve insufficiency    Ascending aortic aneurysm (HCC) 09/11/2016   Atrial fibrillation (HCC)    BPH (benign prostatic hyperplasia)    Cardiomyopathy ([place]) 09/11/2016   Coronary artery calcification seen on CT scan 10/13/2017   History of pulmonary embolism 09/24/2012   Overview:  Last Assessment & Plan:  Hx PE x 2 per notes, ? 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  - needs auto-titration study by [employer] or local company, data to RB to adjust his home device.    Preoperative clearance 10/13/2017   Pulmonary embolism (HCC)    Rhinitis     Past Surgical History:  Procedure Laterality Date   APPENDECTOMY     HERNIA REPAIR     TONSILLECTOMY      Current Medications: Current Meds  Medication Sig   acetaminophen (TYLENOL) 500 MG tablet Take 500 mg by mouth every 6 (six) hours as needed for mild pain or moderate pain.   b complex  vitamins capsule Take 1 capsule by mouth daily. Unknown strength   dapagliflozin propanediol (FARXIGA) 5 MG TABS tablet Take 5 mg by mouth daily.   Enzalutamide (XTANDI PO) Take 1 tablet by mouth daily.   folic acid (FOLVITE) 811 MCG tablet Take 400 mcg by mouth daily.   levocetirizine (XYZAL) 5 MG tablet Take 5 mg by mouth every evening.   meloxicam (MOBIC) 7.5 MG tablet Take 7.5 mg by mouth 2 (two) times daily.   metoprolol tartrate (LOPRESSOR) 50 MG tablet Take 1 tablet (50 mg total) by mouth 2 (two) times daily.   Multiple Vitamin (MULTIVITAMINS PO) Take 1 tablet by mouth daily. Unknown strength   Omega-3 1000 MG CAPS Take 1,200 mg by mouth daily.   Potassium 99 MG TABS Take 1 tablet by mouth daily.   rosuvastatin (CRESTOR) 5 MG tablet Take 5 mg by mouth daily.   sacubitril-valsartan (ENTRESTO) 24-26 MG Take 1 tablet by mouth 2 (two) times daily.   tamsulosin (FLOMAX) 0.4 MG CAPS capsule Take 0.4 mg by mouth daily after supper.   TART CHERRY PO Take 1 tablet by mouth daily. Unknown strenght   UNABLE TO FIND Take 2 tablets by mouth daily. Med Name: Curamin/ Unknown strength   XARELTO 20 MG TABS tablet TAKE 1 TABLET BY MOUTH ONCE DAILY WITH SUPPER   zinc gluconate 50 MG tablet Take 50 mg by mouth daily.     Allergies:   Statins   Social History   Socioeconomic History   Marital status: Married    Spouse name: Not on file   Number of children: 0   Years of education: Not on file   Highest education level: Not on file  Occupational History   Occupation: Engineering geologist  Tobacco Use   Smoking status: Never   Smokeless tobacco: Never  Vaping Use   Vaping Use: Never used  Substance and Sexual Activity   Alcohol use: No   Drug use: No   Sexual activity: Not on file  Other Topics Concern   Not on file  Social History Narrative   Not on file   Social Determinants of Health   Financial Resource Strain: Not on file  Food Insecurity: Not on file  Transportation Needs: Not on  file  Physical Activity: Not on file  Stress: Not on file  Social Connections: Not on file     Family History: The patient's family history includes Alzheimer's disease in his mother; Diabetes Mellitus II in his father; Heart attack in his mother; Stroke in his father and mother.  ROS:   Please see the history of present illness.    All other systems reviewed and are negative.  EKGs/Labs/Other Studies Reviewed:    The following studies were reviewed today: I discussed my findings with the patient at length   Recent Labs: 08/16/2021: BUN 27; Creatinine, Ser 0.99; Potassium 4.2; Sodium 141  Recent Lipid Panel  Component Value Date/Time   CHOL 132 11/13/2018 0924   TRIG 132 11/13/2018 0924   HDL 34 (L) 11/13/2018 0924   CHOLHDL 3.9 11/13/2018 0924   LDLCALC 74 11/13/2018 0924    Physical Exam:    VS:  BP (!) 104/50   Pulse 84   Ht '5\' 10"'$  (1.778 m)   Wt 210 lb 6.4 oz (95.4 kg)   SpO2 96%   BMI 30.19 kg/m     Wt Readings from Last 3 Encounters:  11/09/21 210 lb 6.4 oz (95.4 kg)  08/16/21 214 lb (97.1 kg)  06/13/21 202 lb 12.8 oz (92 kg)     GEN: Patient is in no acute distress HEENT: Normal NECK: No JVD; No carotid bruits LYMPHATICS: No lymphadenopathy CARDIAC: Hear sounds regular, 2/6 systolic murmur at the apex. RESPIRATORY:  Clear to auscultation without rales, wheezing or rhonchi  ABDOMEN: Soft, non-tender, non-distended MUSCULOSKELETAL:  No edema; No deformity  SKIN: Warm and dry NEUROLOGIC:  Alert and oriented x 3 PSYCHIATRIC:  Normal affect   Signed, Aaron Lindau, MD  11/09/2021 11:18 AM    [employer]

## 2021-11-09 NOTE — Patient Instructions (Addendum)
Medication Instructions:  Your physician recommends that you continue on your current medications as directed. Please refer to the Current Medication list given to you today.  *If you need a refill on your cardiac medications before your next appointment, please call your pharmacy*   Lab Work: BMP- today If you have labs (blood work) drawn today and your tests are completely normal, you will receive your results only by: [place] (if you have MyChart) OR A paper copy in the mail If you have any lab test that is abnormal or we need to change your treatment, we will call you to review the results.   Testing/Procedures: None Ordered   Follow-Up: At [facility], you and your health needs are our priority.  As part of our continuing mission to provide you with exceptional heart care, we have created designated Provider Care Teams.  These Care Teams include your primary Cardiologist (physician) and Advanced Practice Providers (APPs -  Physician Assistants and Nurse Practitioners) who all work together to provide you with the care you need, when you need it.  We recommend signing up for the patient portal called "MyChart".  Sign up information is provided on this After Visit Summary.  MyChart is used to connect with patients for Virtual Visits (Telemedicine).  Patients are able to view lab/test results, encounter notes, upcoming appointments, etc.  Non-urgent messages can be sent to your provider as well.   To learn more about what you can do with MyChart, go to [email or web address].    Your next appointment:   6 month(s)  The format for your next appointment:   In Person  Provider:   Jyl Heinz MD    Other Instructions NA

## 2021-11-10 LAB — BASIC METABOLIC PANEL
BUN/Creatinine Ratio: 23 (ref 10–24)
BUN: 24 mg/dL (ref 8–27)
CO2: 27 mmol/L (ref 20–29)
Calcium: 10.2 mg/dL (ref 8.6–10.2)
Chloride: 104 mmol/L (ref 96–106)
Creatinine, Ser: 1.04 mg/dL (ref 0.76–1.27)
Glucose: 106 mg/dL — ABNORMAL HIGH (ref 70–99)
Potassium: 4.9 mmol/L (ref 3.5–5.2)
Sodium: 141 mmol/L (ref 134–144)
eGFR: 75 mL/min/{1.73_m2} (ref >59)

## 2021-11-19 DIAGNOSIS — D6869 Other thrombophilia: Secondary | ICD-10-CM | POA: Diagnosis not present

## 2021-11-19 DIAGNOSIS — I10 Essential (primary) hypertension: Secondary | ICD-10-CM | POA: Diagnosis not present

## 2021-11-19 DIAGNOSIS — Z7901 Long term (current) use of anticoagulants: Secondary | ICD-10-CM | POA: Diagnosis not present

## 2021-11-19 DIAGNOSIS — I480 Paroxysmal atrial fibrillation: Secondary | ICD-10-CM | POA: Diagnosis not present

## 2021-11-23 ENCOUNTER — Ambulatory Visit (INDEPENDENT_AMBULATORY_CARE_PROVIDER_SITE_OTHER): Payer: PPO | Admitting: Internal Medicine

## 2021-11-23 ENCOUNTER — Encounter: Payer: Self-pay | Admitting: Internal Medicine

## 2021-11-23 VITALS — BP 120/60 | HR 88 | Temp 97.7°F | Resp 16 | Ht 69.0 in | Wt 209.0 lb

## 2021-11-23 DIAGNOSIS — M545 Low back pain, unspecified: Secondary | ICD-10-CM | POA: Diagnosis not present

## 2021-11-23 DIAGNOSIS — I480 Paroxysmal atrial fibrillation: Secondary | ICD-10-CM | POA: Diagnosis not present

## 2021-11-23 DIAGNOSIS — I10 Essential (primary) hypertension: Secondary | ICD-10-CM | POA: Diagnosis not present

## 2021-11-23 DIAGNOSIS — N182 Chronic kidney disease, stage 2 (mild): Secondary | ICD-10-CM

## 2021-11-23 DIAGNOSIS — Z23 Encounter for immunization: Secondary | ICD-10-CM

## 2021-11-23 DIAGNOSIS — Z8679 Personal history of other diseases of the circulatory system: Secondary | ICD-10-CM | POA: Diagnosis not present

## 2021-11-23 HISTORY — DX: Chronic kidney disease, stage 2 (mild): N18.2

## 2021-11-23 MED ORDER — METHYLPREDNISOLONE 4 MG PO TABS: 4.0000 mg | ORAL_TABLET | Freq: Every day | ORAL | 0 refills | Status: AC

## 2021-11-23 MED ORDER — CYCLOBENZAPRINE HCL 10 MG PO TABS: 10.0000 mg | ORAL_TABLET | Freq: Three times a day (TID) | ORAL | 0 refills | Status: DC | PRN

## 2021-11-23 NOTE — Assessment & Plan Note (Signed)
He is now on entresto and his EF has improved over the last 2-3 months.  I reviewed notes from cardiology.  His BP has been stable on entresto.  I want him to eat healthy and exercise.

## 2021-11-23 NOTE — Addendum Note (Signed)
Addended byLucile Shutters on: 11/23/2021 10:22 AM   Modules accepted: Orders

## 2021-11-23 NOTE — Assessment & Plan Note (Signed)
His CKD has been stable.  I have asked him not to take any NSAIDS.

## 2021-11-23 NOTE — Assessment & Plan Note (Signed)
His A. Fib is currently rate controlled.  He remains on anticoagulation with xarelto at this time.

## 2021-11-23 NOTE — Progress Notes (Signed)
Office Visit  Subjective   Patient ID: Aaron Richmond   DOB: 09/28/1945   Age: 76 y.o.   MRN: [id number]   Chief Complaint Chief Complaint  Patient presents with   Follow-up     History of Present Illness Aaron Richmond is a 76 yo male  who returns for followup of his A. Fib.  He did see cardiology in followup on 08/16/2021 where he had a repeat ECHO done on 08/07/2021.  He now has a cardiomyopathy where his ECHO showed that he had a mild to moderate decrease his LV function with a LVEF of 40-45%.  He has LV hypokinesis and a LV internal cavity size that is mildly dilated.  He has moderate concentric LVH and his left diastolic parameters were indeterminate.  His LA is severely dilated and he has moderate MR and AV.  Dr. [name] asked him to stop his losartan and start entresto 24/'26mg'$  BID which he has done and denies any problems.  His BP has been stable at home.   He also asked him to stop his diltiazem since going on entresto as well.  They did do a repeat ECHO on 10/09/2021 and this showed an improvement in his EF where his EF went up to 50-55% and low normal systolic function.  There was no regional wall motion abnormalities. The left ventricular internal cavity size was mildly to moderately dilated. His Left ventricular diastolic parameters are indeterminate.  His right ventricular systolic function is normal. The right ventricular  size is normal. There is normal pulmonary artery systolic pressure. His left atrial size was severely dilated. He mild to moderate mitral valve regurgitation.  He has an aneurysm of the ascending aorta, measuring 51 mm. The patient did followup with Cardiology on 11/09/2021 and recommended continued management and he was stable at that time. Again, the patient was admitted to [facility] from 06/03/2021 until 06/07/2021 with A. Fib with RVR.  He states that he woke up after a nap and began having severe generalized weakness where he could not use his legs.  The patient  was brought in to the ER where his wife states his HR was in the 190's and they discovered he was in A. Fib with RVR.  He has a known history of A. Fib where he is followed regularly by myself and cardiology.  While in the hospital, they did place him on a Cardizem drip.  He did not have a repeat ECHO.  They did perform a CTA of his chest which showed no evidence of pulmonary embolism and he had a stable 4.9 AAA that was unchanged.  His heart rate was controlled and they increase his metoprolol from '25mg'$  BID to '50mg'$  BID and started him on Cardizem '60mg'$  TID which did control his heart rate.  Again, cardiology performed a stress ECHO in Oct 2016 which was normal.  They did a ECHO in 02/2017 which showed moderate LVH with a normal LVEF 50-55% without wall motion abnormalities.  He had a repeat ECHO in 09/04/2019 and this showed an LVEF 50-55% with his LV cavity size severely dilated and his LA was severely dilated and there was mild-moderate MVR.  He had AV insuffiency that was moderate and a LV dilatation where they think he may be coming soon to the point of surgery.   Anticoagulation status: He remains on lifelong xarelto therapy per cardiology. CHADVASC2 score is 3.  He denies any bruising, melena, BRBPR or other bleeding problems.  His generalized  weakness is resolved and he denies any chest pain, SOB, or peripheral edema.  The patient states he is having some fatigue ever since going on his cancer medication a year ago.  The patient is a 76 year old Caucasian/White male who presents for a follow-up evaluation of hypertension.  Since his last visit, he states he has not had any problems.   The patient has been checking his blood pressure at home.  He states his systolic BP has been running in the 120's. The patient's current medications include:  metoprolol tartrate 50 mg BID and entresto 24/26 BID. The patient has been tolerating his medications well. The patient denies any headache, visual changes, dizziness,  lightheadness, chest pain, shortness of breath, weakness/numbness, and edema.  He reports there have been no other symptoms noted.  He does have a history of Stage II-III CKD which has been going on for the last 5-6 years.  He has been stable and his GFR has been running in the 60's with a creatinine baseline 1.2-1.3.  I started him on farxiga for renal protection last year and he states he is not having any problems or side effects.  The patient also states that he was going to the kitchen sink about 5 days ago and he heard his back pop.  He did not bend over but he began having a sharp/dull aching pain that has been continuous since Monday.  It has slightly improved over the last 5 days.  He has been taking tylenol which does not help.  The patient denies any new weakness/numbness and no loss of bowel/bladder control.  He states he never hurt his back.         Past Medical History Past Medical History:  Diagnosis Date   Aneurysm of aortic root ([place])    Overview:  Last Assessment & Plan:  Planning to see Dr [name] for evaluation of 5cm aneurysm.  - will check PFT in prep for possible procedure / SGY   Aortic regurgitation    Aortic valve insufficiency    Ascending aortic aneurysm (HCC) 09/11/2016   Atrial fibrillation (HCC)    BPH (benign prostatic hyperplasia)    Cardiomyopathy ([place]) 09/11/2016   Coronary artery calcification seen on CT scan 10/13/2017   History of pulmonary embolism 09/24/2012   Overview:  Last Assessment & Plan:  Hx PE x 2 per notes, ? 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  - needs auto-titration study by [employer] or local company, data to RB to adjust his home device.    Preoperative clearance 10/13/2017   Pulmonary embolism (HCC)    Rhinitis      Allergies No Known Allergies   Review of Systems Review of Systems  Constitutional:  Negative for chills and fever.  Eyes:  Negative for blurred vision and double vision.  Respiratory:  Negative for cough, shortness of breath and wheezing.   Cardiovascular:  Negative for chest pain, palpitations and leg swelling.  Gastrointestinal:  Positive for constipation. Negative for abdominal pain, blood  in stool, diarrhea, melena, nausea and vomiting.  Genitourinary:  Negative for hematuria and urgency.  Musculoskeletal:  Negative for myalgias.  Skin:  Negative for itching and rash.  Neurological:  Negative for dizziness, weakness and headaches.  Psychiatric/Behavioral:  Negative for depression. The patient is not nervous/anxious.        Objective:    Vitals BP 120/60 (BP Location: Left Arm, Patient Position: Sitting, Cuff Size: Normal)   Pulse 88   Temp 97.7 F (36.5 C) (Temporal)   Resp 16   Ht '5\' 9"'$  (1.753 m)   Wt 209 lb 0.2 oz (94.8 kg)   SpO2 93%   BMI 30.87 kg/m    Physical Examination Physical Exam Constitutional:      Appearance: Normal appearance. He is not ill-appearing.  Cardiovascular:     Rate and Rhythm: Normal rate and regular rhythm.     Pulses: Normal pulses.     Heart sounds: Normal heart sounds. No murmur heard.    No friction rub. No gallop.  Pulmonary:     Effort: Pulmonary effort is normal. No respiratory distress.     Breath sounds: Normal breath sounds. No wheezing, rhonchi or rales.  Abdominal:     General: Abdomen is flat. Bowel sounds are normal. There is no distension.     Palpations: Abdomen is soft.     Tenderness: There is no abdominal tenderness.  Musculoskeletal:     Right lower leg: No edema.     Left  lower leg: No edema.  Skin:    General: Skin is warm and dry.     Findings: No rash.  Neurological:     General: No focal deficit present.     Mental Status: He is alert and oriented to person, place, and time.  Psychiatric:        Mood and Affect: Mood normal.        Behavior: Behavior normal.        Thought Content: Thought content normal.        Judgment: Judgment normal.        Assessment & Plan:   History of cardiomyopathy He is now on entresto and his EF has improved over the last 2-3 months.  I reviewed notes from cardiology.  His BP has been stable on entresto.  I want him to eat healthy and exercise.  Atrial fibrillation His A. Fib is currently rate controlled.  He remains on anticoagulation with xarelto at this time.  Hypertension He was stopped off his ARB and CCB and his BP is doing well on entresto and he has not had any hypotensive episodes or symptoms.  CKD (chronic kidney disease) stage 2, GFR 60-89 ml/min His CKD has been stable.  I have asked him not to take any NSAIDS.  Acute right-sided low back pain without sciatica He has had back problems in the past.  I am going to start him on a medrol pak and cyclobenzaprine as needed.  Warnings regarding cyclobenzaprine discussed.  If he does not improve in the next 3 weeks, he is to call back.    Return in about 3 months (around 02/23/2022) for annual.     [name], MD

## 2021-11-23 NOTE — Assessment & Plan Note (Signed)
He has had back problems in the past.  I am going to start him on a medrol pak and cyclobenzaprine as needed.  Warnings regarding cyclobenzaprine discussed.  If he does not improve in the next 3 weeks, he is to call back.

## 2021-11-23 NOTE — Assessment & Plan Note (Signed)
He was stopped off his ARB and CCB and his BP is doing well on entresto and he has not had any hypotensive episodes or symptoms.

## 2021-12-06 ENCOUNTER — Other Ambulatory Visit: Payer: Self-pay

## 2021-12-06 MED ORDER — DAPAGLIFLOZIN PROPANEDIOL 5 MG PO TABS: 5.0000 mg | ORAL_TABLET | Freq: Every day | ORAL | 1 refills | Status: DC

## 2021-12-07 DIAGNOSIS — M5416 Radiculopathy, lumbar region: Secondary | ICD-10-CM | POA: Diagnosis not present

## 2021-12-07 DIAGNOSIS — M4316 Spondylolisthesis, lumbar region: Secondary | ICD-10-CM | POA: Diagnosis not present

## 2021-12-11 ENCOUNTER — Other Ambulatory Visit: Payer: Self-pay

## 2021-12-11 DIAGNOSIS — M4316 Spondylolisthesis, lumbar region: Secondary | ICD-10-CM | POA: Diagnosis not present

## 2021-12-11 MED ORDER — DAPAGLIFLOZIN PROPANEDIOL 5 MG PO TABS: 5.0000 mg | ORAL_TABLET | Freq: Every day | ORAL | 1 refills | Status: DC

## 2021-12-12 ENCOUNTER — Ambulatory Visit (INDEPENDENT_AMBULATORY_CARE_PROVIDER_SITE_OTHER): Payer: PPO | Admitting: Internal Medicine

## 2021-12-12 VITALS — BP 112/70 | HR 100 | Temp 97.8°F | Resp 16 | Ht 69.0 in | Wt 198.0 lb

## 2021-12-12 DIAGNOSIS — M4316 Spondylolisthesis, lumbar region: Secondary | ICD-10-CM

## 2021-12-12 HISTORY — DX: Spondylolisthesis, lumbar region: M43.16

## 2021-12-12 NOTE — Assessment & Plan Note (Signed)
The patient states his ESI did not work.  He is interested in surgery but has not had a MRI of his spine in a while.  He goes back to see Dr. Towanda Octave office on 12/28/2021 where I asked him to discuss another series of ESI and to discuss surgery with him.  I have asked him to stop the meloxicam due to his CKD.  He restarted the meloxicam even after I told him to stop this.

## 2021-12-12 NOTE — Progress Notes (Signed)
Office Visit  Subjective   Patient ID: Aaron Richmond   DOB: [birth year]   Age: 76 y.o.   MRN: [id number]   Chief Complaint Chief Complaint  Patient presents with   Acute Visit    Back pain     History of Present Illness I did see Aaron Richmond about 3 weeks ago where he came in with left lower back pain.  He told me at that time that he was going to the kitchen sink about 5 days earlier and he heard his back pop.  He did not bend over but he began having a sharp/dull aching pain that has been continuous since Monday.  It has slightly improved over the last 5 days.  He has been taking tylenol which does not help.  The patient denies any new weakness/numbness and no loss of bowel/bladder control.  I gave him a medrol dose pak and cyclobenzaprine at that time which did resolve his left lower back pain.    He related to me today that 3 weeks ago he also had right lower back pain as well.  He has had this pain in the past where he tells me has a herniated disc of hiw lower lumbar spine.  He has seen Aaron Richmond who states he has grade 1 spondylolithesis of L4-L5 with right lower extremity radiation.  He saw Aaron Richmond 10/2019 and he did have an ESI of his lumbar spine.  He states this helped with his pain considerably.  He states he had a trigger point injection a month ago and this also helped with his lower back pain.  We sent him to ortho in the past due to right knee pain.  Ortho did xrays which showed arthritis of his right knee and they did a cortisone injection at that time.  Again, he had a right knee replacement 01/2018.  He is supposed to be on celebrex where I stopped him on meloxicam but he is still taking left over meloxicam.  He did see radiology last week on 12/07/2021 and he tells me they did another ESI.  The patient states this did not help.  He saw  Dr. Towanda Octave PA yesterday where he asked him to give another week for the [name] and gave him a prescripation for tramadol for 5 days as  needed.  Today, there is no loss of bowel/bladder function and no new weakness/numbness.                 Past Medical History Past Medical History:  Diagnosis Date   Aneurysm of aortic root ([place])    Overview:  Last Assessment & Plan:  Planning to see Dr [name] for evaluation of 5cm aneurysm.  - will check PFT in prep for possible procedure / SGY   Aortic regurgitation    Aortic valve insufficiency    Ascending aortic aneurysm ([facility]) 09/11/2016   Atrial fibrillation (HCC)    BPH (benign prostatic hyperplasia)    Cardiomyopathy ([place]) 09/11/2016   Coronary artery calcification seen on CT scan 10/13/2017   History of pulmonary embolism 09/24/2012   Overview:  Last Assessment & Plan:  Hx PE x 2 per notes, ? 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  - needs auto-titration study by [employer] or local company, data to RB to adjust his home device.    Preoperative clearance 10/13/2017   Pulmonary embolism (HCC)    Rhinitis      Allergies No Known Allergies   Review of Systems Review of Systems  Constitutional:  Negative for chills and fever.  Respiratory:  Negative for cough and shortness of breath.   Cardiovascular:  Negative for chest pain, palpitations and leg swelling.  Gastrointestinal:  Negative for constipation, diarrhea, nausea and vomiting.  Neurological:  Negative for dizziness, focal weakness, weakness and headaches.       Objective:    Vitals BP 112/70 (BP Location: Right Arm, Patient Position: Sitting, Cuff Size:  Normal)   Pulse 100   Temp 97.8 F (36.6 C) (Temporal)   Resp 16   Ht '5\' 9"'$  (1.753 m)   Wt 198 lb 0.6 oz (89.8 kg)   SpO2 97%   BMI 29.25 kg/m    Physical Examination Physical Exam Constitutional:      Appearance: Normal appearance. He is not ill-appearing.  Cardiovascular:     Rate and Rhythm: Normal rate and regular rhythm.     Pulses: Normal pulses.     Heart sounds: Normal heart sounds. No murmur heard.    No friction rub. No gallop.  Pulmonary:     Effort: Pulmonary effort is normal. No respiratory distress.     Breath sounds: Normal breath sounds. No wheezing, rhonchi or rales.  Abdominal:     General: Abdomen is flat. Bowel sounds are normal. There is no distension.     Palpations: Abdomen is soft.     Tenderness: There is no abdominal tenderness.  Musculoskeletal:     Right lower leg: No edema.     Left lower leg: No edema.     Comments: He has pain upon palpation of his right lower back.  Neurological:     General: No focal deficit present.     Mental Status: He is alert and oriented to person, place, and time.        Assessment & Plan:   Spondylolisthesis of lumbar region The patient states his ESI did not work.  He is interested in surgery but has not had a MRI of his spine in a while.  He goes back to see Aaron Richmond office on 12/28/2021 where I asked him to discuss another series of ESI and to discuss surgery with him.  I have asked him to stop the meloxicam due to his CKD.  He restarted the meloxicam even after I told him to stop this.      No follow-ups on file.   Aaron Roger, MD

## 2021-12-14 ENCOUNTER — Telehealth: Payer: Self-pay

## 2021-12-14 ENCOUNTER — Other Ambulatory Visit: Payer: Self-pay | Admitting: Cardiology

## 2021-12-14 DIAGNOSIS — I4819 Other persistent atrial fibrillation: Secondary | ICD-10-CM

## 2021-12-14 NOTE — Telephone Encounter (Signed)
..     Pre-operative Risk Assessment    Patient Name: Aaron Richmond  DOB: 08-03-1945 MRN: [id number]      Request for Surgical Clearance    Procedure:   LUMBAR 4-5. 5-S1  Date of Surgery:  Clearance TBD                                 Surgeon:  DR Creig Hines Surgeon's Group or Practice Name:  [facility] Phone number:  [phone] Fax number:  [phone]   Type of Clearance Requested:   - Medical  - Pharmacy:  Hold Rivaroxaban (Xarelto)     Type of Anesthesia:  Not Indicated   Additional requests/questions:    Gwenlyn Found   12/14/2021, 1:55 PM

## 2021-12-14 NOTE — Telephone Encounter (Signed)
Left message for surgery scheduler Kim to please clarify procedure if injection or fusion. Left to call back [phone].

## 2021-12-17 DIAGNOSIS — M545 Low back pain, unspecified: Secondary | ICD-10-CM | POA: Diagnosis not present

## 2021-12-17 DIAGNOSIS — M4856XA Collapsed vertebra, not elsewhere classified, lumbar region, initial encounter for fracture: Secondary | ICD-10-CM | POA: Diagnosis not present

## 2021-12-17 DIAGNOSIS — M1990 Unspecified osteoarthritis, unspecified site: Secondary | ICD-10-CM | POA: Diagnosis not present

## 2021-12-17 DIAGNOSIS — C61 Malignant neoplasm of prostate: Secondary | ICD-10-CM | POA: Diagnosis not present

## 2021-12-17 DIAGNOSIS — I4891 Unspecified atrial fibrillation: Secondary | ICD-10-CM | POA: Diagnosis not present

## 2021-12-17 DIAGNOSIS — Z923 Personal history of irradiation: Secondary | ICD-10-CM | POA: Diagnosis not present

## 2021-12-17 DIAGNOSIS — M5416 Radiculopathy, lumbar region: Secondary | ICD-10-CM | POA: Diagnosis not present

## 2021-12-17 DIAGNOSIS — S32010A Wedge compression fracture of first lumbar vertebra, initial encounter for closed fracture: Secondary | ICD-10-CM | POA: Diagnosis not present

## 2021-12-17 DIAGNOSIS — G8929 Other chronic pain: Secondary | ICD-10-CM | POA: Diagnosis not present

## 2021-12-17 DIAGNOSIS — Z79899 Other long term (current) drug therapy: Secondary | ICD-10-CM | POA: Diagnosis not present

## 2021-12-17 DIAGNOSIS — Z7901 Long term (current) use of anticoagulants: Secondary | ICD-10-CM | POA: Diagnosis not present

## 2021-12-17 DIAGNOSIS — C7951 Secondary malignant neoplasm of bone: Secondary | ICD-10-CM | POA: Diagnosis not present

## 2021-12-17 DIAGNOSIS — E7800 Pure hypercholesterolemia, unspecified: Secondary | ICD-10-CM | POA: Diagnosis not present

## 2021-12-17 DIAGNOSIS — N400 Enlarged prostate without lower urinary tract symptoms: Secondary | ICD-10-CM | POA: Diagnosis not present

## 2021-12-17 DIAGNOSIS — M810 Age-related osteoporosis without current pathological fracture: Secondary | ICD-10-CM | POA: Diagnosis not present

## 2021-12-17 DIAGNOSIS — I7121 Aneurysm of the ascending aorta, without rupture: Secondary | ICD-10-CM | POA: Diagnosis not present

## 2021-12-17 DIAGNOSIS — M549 Dorsalgia, unspecified: Secondary | ICD-10-CM | POA: Diagnosis not present

## 2021-12-17 DIAGNOSIS — I10 Essential (primary) hypertension: Secondary | ICD-10-CM | POA: Diagnosis not present

## 2021-12-17 NOTE — Telephone Encounter (Signed)
Patient with diagnosis of afib and PE on Xarelto for anticoagulation.    Procedure: lumbar ESI Date of procedure: TBD  CHA2DS2-VASc Score = 5  This indicates a 7.2% annual risk of stroke. The patient's score is based upon: CHF History: 1 HTN History: 1 Diabetes History: 0 Stroke History: 0 Vascular Disease History: 1 Age Score: 2 Gender Score: 0   CrCl 12m/min using adjusted body weight  Platelet count 170K  Per office protocol, patient can hold Xarelto for 3 days prior to procedure.    **This guidance is not considered finalized until pre-operative APP has relayed final recommendations.**

## 2021-12-17 NOTE — Telephone Encounter (Signed)
Xarelto '20mg'$  refill request received. Pt is 76 years old, weight-89.8kg, Crea-1.04 on 11/09/2021, last seen by Dr. [name] on 11/09/2021, Diagnosis-Afib, CrCl- 77.95 mL/min; Dose is appropriate based on dosing criteria. Will send in refill to requested pharmacy.

## 2021-12-17 NOTE — Telephone Encounter (Signed)
Will route to PharmD for rec's re: holding anticoagulation. Richardson Dopp, PA-C    12/17/2021 10:05 AM

## 2021-12-17 NOTE — Telephone Encounter (Signed)
Left a message for the pt to call back to set up tele pre op appt.

## 2021-12-17 NOTE — Telephone Encounter (Signed)
   Name: JOVANNIE ULIBARRI  DOB: Jan 30, 1945  MRN: [id number]  Primary Cardiologist: [name], MD  Chart reviewed as part of pre-operative protocol coverage. Because of AKIF WELDY past medical history and time since last visit, he will require a follow-up phone office visit in order to better assess preoperative cardiovascular risk.  Pre-op covering staff: - Please schedule appointment and call patient to inform them. If patient already had an upcoming appointment within acceptable timeframe, please add "pre-op clearance" to the appointment notes so provider is aware. - Please contact requesting surgeon's office via preferred method (i.e, phone, fax) to inform them of need for appointment prior to surgery.  Loel Dubonnet, NP  12/17/2021, 1:18 PM

## 2021-12-18 NOTE — Telephone Encounter (Signed)
Pt called and wanted to know what I was able to find out if he was ok to still have his surgery. I informed the pt that I s/w our DOD Dr. Jenkins Rouge today for further advice. I assured him per Dr. Johnsie Cancel if this is urgent surgery and he has already been admitted, that we are not stopping the surgery. Informed the pt per Dr. Johnsie Cancel I have let Dr. Donivan Scull know to contact our team if needed. I assured the pt that I had sent these notes to Dr. Donivan Scull himself. Pt thanked me for the help in this matter as he is in a lot of pain.   I will update Dr. Donivan Scull with this new update to notes.

## 2021-12-18 NOTE — Telephone Encounter (Signed)
I s/w our DOD Dr. Johnsie Cancel today for further advice. I wanted to be sure that we had all notes covered for the Aaron Richmond. Per Dr. Johnsie Cancel the best thing at this point since the Aaron Richmond will be having URGENT surgery now, would be to let the surgeon office know that our cardiology team know at whatever hospital the Aaron Richmond is currently in.

## 2021-12-18 NOTE — Telephone Encounter (Signed)
Left message x 2 for Aaron Richmond surgery scheduler for Dr. Donivan Richmond to please call back and confirm if procedure is injection or fusion. Left message as well that the pt will need a tele visit for pre op clearance and we have left a message for the pt to call back to schedule.   Left direct ext to pre op line [phone].    I s/w the pt to set up a tele pre op appt when the pt tells me that he was admitted to the hospital yesterday and that his back surgery will be tomorrow URGENT.  Pt clarified this will be a fusion to be done with Dr. Donivan Richmond. Pt just saw Dr. Geraldo Richmond 11/09/21, though not for pre op clearance. I informed the pt to let me work on this. I will contact Dr. Geraldo Richmond and see if I may be able to have him review for pre op clearance.. pt said thank you.

## 2021-12-19 DIAGNOSIS — S32010A Wedge compression fracture of first lumbar vertebra, initial encounter for closed fracture: Secondary | ICD-10-CM | POA: Diagnosis not present

## 2021-12-22 DIAGNOSIS — Z923 Personal history of irradiation: Secondary | ICD-10-CM | POA: Diagnosis not present

## 2021-12-22 DIAGNOSIS — M545 Low back pain, unspecified: Secondary | ICD-10-CM | POA: Diagnosis not present

## 2021-12-22 DIAGNOSIS — Z8781 Personal history of (healed) traumatic fracture: Secondary | ICD-10-CM | POA: Diagnosis not present

## 2021-12-22 DIAGNOSIS — Z8546 Personal history of malignant neoplasm of prostate: Secondary | ICD-10-CM | POA: Diagnosis not present

## 2022-01-02 ENCOUNTER — Other Ambulatory Visit: Payer: Self-pay | Admitting: Cardiology

## 2022-01-02 DIAGNOSIS — C61 Malignant neoplasm of prostate: Secondary | ICD-10-CM | POA: Diagnosis not present

## 2022-01-03 DIAGNOSIS — M5416 Radiculopathy, lumbar region: Secondary | ICD-10-CM | POA: Diagnosis not present

## 2022-01-09 DIAGNOSIS — M545 Low back pain, unspecified: Secondary | ICD-10-CM | POA: Diagnosis not present

## 2022-01-09 DIAGNOSIS — M5136 Other intervertebral disc degeneration, lumbar region: Secondary | ICD-10-CM | POA: Diagnosis not present

## 2022-01-09 DIAGNOSIS — M4316 Spondylolisthesis, lumbar region: Secondary | ICD-10-CM | POA: Diagnosis not present

## 2022-01-18 DIAGNOSIS — M5416 Radiculopathy, lumbar region: Secondary | ICD-10-CM | POA: Diagnosis not present

## 2022-01-18 DIAGNOSIS — M4316 Spondylolisthesis, lumbar region: Secondary | ICD-10-CM | POA: Diagnosis not present

## 2022-01-21 ENCOUNTER — Telehealth: Payer: Self-pay

## 2022-01-21 NOTE — Telephone Encounter (Signed)
  Patient Consent for Virtual Visit         Aaron Richmond has provided verbal consent on 01/21/2022 for a virtual visit (video or telephone).   CONSENT FOR VIRTUAL VISIT FOR:  Aaron Richmond  By participating in this virtual visit I agree to the following:  I hereby voluntarily request, consent and authorize [facility] and its employed or contracted physicians, physician assistants, nurse practitioners or other licensed health care professionals (the Practitioner), to provide me with telemedicine health care services (the "Services") as deemed necessary by the treating Practitioner. I acknowledge and consent to receive the Services by the Practitioner via telemedicine. I understand that the telemedicine visit will involve communicating with the Practitioner through live audiovisual communication technology and the disclosure of certain medical information by electronic transmission. I acknowledge that I have been given the opportunity to request an in-person assessment or other available alternative prior to the telemedicine visit and am voluntarily participating in the telemedicine visit.  I understand that I have the right to withhold or withdraw my consent to the use of telemedicine in the course of my care at any time, without affecting my right to future care or treatment, and that the Practitioner or I may terminate the telemedicine visit at any time. I understand that I have the right to inspect all information obtained and/or recorded in the course of the telemedicine visit and may receive copies of available information for a reasonable fee.  I understand that some of the potential risks of receiving the Services via telemedicine include:  Delay or interruption in medical evaluation due to technological equipment failure or disruption; Information transmitted may not be sufficient (e.g. poor resolution of images) to allow for appropriate medical decision making by the  Practitioner; and/or  In rare instances, security protocols could fail, causing a breach of personal health information.  Furthermore, I acknowledge that it is my responsibility to provide information about my medical history, conditions and care that is complete and accurate to the best of my ability. I acknowledge that Practitioner's advice, recommendations, and/or decision may be based on factors not within their control, such as incomplete or inaccurate data provided by me or distortions of diagnostic images or specimens that may result from electronic transmissions. I understand that the practice of medicine is not an exact science and that Practitioner makes no warranties or guarantees regarding treatment outcomes. I acknowledge that a copy of this consent can be made available to me via my patient portal ([facility]), or I can request a printed copy by calling the office of [facility].    I understand that my insurance will be billed for this visit.   I have read or had this consent read to me. I understand the contents of this consent, which adequately explains the benefits and risks of the Services being provided via telemedicine.  I have been provided ample opportunity to ask questions regarding this consent and the Services and have had my questions answered to my satisfaction. I give my informed consent for the services to be provided through the use of telemedicine in my medical care

## 2022-01-21 NOTE — Telephone Encounter (Signed)
Virtual appt schedule. Consent given and med list reviewed. Patient agreeable and voiced understanding.

## 2022-01-21 NOTE — Telephone Encounter (Signed)
   Name: Aaron Richmond  DOB: 1945/04/07  MRN: [id number]  Primary Cardiologist: [name], MD   Preoperative team, please contact this patient and set up a phone call appointment for further preoperative risk assessment. Please obtain consent and complete medication review. Thank you for your help.  I confirm that guidance regarding antiplatelet and oral anticoagulation therapy has been completed and, if necessary, noted below.  Per office protocol, patient can hold Xarelto for 3 days prior to procedure.     Mable Fill, Marissa Nestle, NP 01/21/2022, 2:19 PM [place]

## 2022-01-23 DIAGNOSIS — Z981 Arthrodesis status: Secondary | ICD-10-CM | POA: Diagnosis not present

## 2022-01-23 DIAGNOSIS — M545 Low back pain, unspecified: Secondary | ICD-10-CM | POA: Diagnosis not present

## 2022-01-23 DIAGNOSIS — S2231XA Fracture of one rib, right side, initial encounter for closed fracture: Secondary | ICD-10-CM | POA: Diagnosis not present

## 2022-01-23 DIAGNOSIS — G8929 Other chronic pain: Secondary | ICD-10-CM | POA: Diagnosis not present

## 2022-01-23 DIAGNOSIS — R079 Chest pain, unspecified: Secondary | ICD-10-CM | POA: Diagnosis not present

## 2022-01-23 DIAGNOSIS — Z79899 Other long term (current) drug therapy: Secondary | ICD-10-CM | POA: Diagnosis not present

## 2022-01-23 DIAGNOSIS — R42 Dizziness and giddiness: Secondary | ICD-10-CM | POA: Diagnosis not present

## 2022-01-23 DIAGNOSIS — I4891 Unspecified atrial fibrillation: Secondary | ICD-10-CM | POA: Diagnosis not present

## 2022-01-24 ENCOUNTER — Ambulatory Visit: Payer: PPO | Attending: Cardiology | Admitting: General Practice

## 2022-01-24 ENCOUNTER — Ambulatory Visit: Payer: PPO | Admitting: Internal Medicine

## 2022-01-24 ENCOUNTER — Encounter: Payer: Self-pay | Admitting: Internal Medicine

## 2022-01-24 ENCOUNTER — Telehealth: Payer: Self-pay | Admitting: General Practice

## 2022-01-24 VITALS — BP 110/70 | HR 70 | Temp 97.6°F | Resp 16 | Ht 69.0 in | Wt 181.6 lb

## 2022-01-24 DIAGNOSIS — Z01810 Encounter for preprocedural cardiovascular examination: Secondary | ICD-10-CM | POA: Diagnosis not present

## 2022-01-24 DIAGNOSIS — M5416 Radiculopathy, lumbar region: Secondary | ICD-10-CM

## 2022-01-24 HISTORY — DX: Radiculopathy, lumbar region: M54.16

## 2022-01-24 NOTE — Progress Notes (Signed)
Office Visit  Subjective   Patient ID: Aaron Richmond   DOB: [birth year]   Age: 77 y.o.   MRN: [id number]   Chief Complaint Chief Complaint  Patient presents with   Surgical Clearance     History of Present Illness Mr. Chaires returns today for preoperative clearance where he has seen orthospine and they want to perform a lumbar posterior interbody fusion.  I did see the patient in 12/2021 where 3 weeks prior to that he came in with left lower back pain.  He told me at that time that he was going to the kitchen sink about 5 days earlier and he heard his back pop.  He did not bend over but he began having a sharp/dull aching pain that has been continuous. He denied any new weakness/numbness and no loss of bowel/bladder control.  I gave him a medrol dose pak and cyclobenzaprine at that time which did resolve his left lower back pain. He has had this pain in the past where he tells me has a herniated disc of his lower lumbar spine.  He has seen Dr. Donivan Scull who states he has grade 1 spondylolithesis of L4-L5 with right lower extremity radiation.  He saw Dr. Donivan Scull 10/2019 and he did have an ESI of his lumbar spine.  This did help with his pain considerably at that time.  He had a trigger point injection around 11/2021 and this also helped with his lower back pain.  We sent him to ortho in the past due to right knee pain.  Ortho did xrays which showed arthritis of his right knee and they did a cortisone injection at that time.  Again, he had a right knee replacement 01/2018.  He is supposed to be on celebrex where I stopped him on meloxicam but he is still taking left over meloxicam.  He did see radiology on 12/07/2021 and he tells me they did another ESI.  The patient states this did not help.  When I saw him 12/2021, I felt he needed another MRI.  He had an appointment coming up with Dr. Donivan Scull within 2 weeks and I asked him to followup at that time.    Unfortunately, he presented to [facility] ER on 12/17/2021  with worsening lower back pain.  He did have a repeat MRI of his lumbar spine on 12/17/2021 and this showed interval development of an acute/subacute L1 compression fracture with mild osseous retropulsion and was associated with a vertical fracture through the right pedicle.  There was unchanged mild to moderate spinal stenosis at L4-L5 and other changes.   He had a kyphoplasty performed  on 12/19/2021 with Dr. Donivan Scull which he states did help some with his pain.  He did see Dr. Donivan Scull in followup on 01/18/2022 where he has lumbar radiculopathy and they are scheduling him for surgery at this time.  Mr. Edelson is a 77 yo male who has risk factors of atrial fib, HTN, HLD, cardiomyopathy and lifelong anticoagulation for history of A. Fib and PE.  He is followed by cardiology where he has an upcoming appointment for cardiac clearance for this surgery.   He had a repeat ECHO done on 08/07/2021.  He now has a cardiomyopathy where his ECHO showed that he had a mild to moderate decrease his LV function with a LVEF of 40-45%.  He has LV hypokinesis and a LV internal cavity size that is mildly dilated.  He has moderate concentric LVH and his left diastolic parameters were  indeterminate.  His LA is severely dilated and he has moderate MR and AV.  Dr. [name] asked him to stop his losartan and start entresto 24/'26mg'$  BID which he has done and denies any problems.  His BP has been stable at home.   He also asked him to stop his diltiazem since going on entresto as well.  They did do a repeat ECHO on 10/09/2021 and this showed an improvement in his EF where his EF went up to 50-55% and low normal systolic function.  There was no regional wall motion abnormalities. The left ventricular internal cavity size was mildly to moderately dilated. His Left ventricular diastolic parameters are indeterminate.  His right ventricular systolic function is normal. The right ventricular  size is normal. There is normal pulmonary artery systolic  pressure. His left atrial size was severely dilated. He mild to moderate mitral valve regurgitation.  He has an aneurysm of the ascending aorta, measuring 51 mm. The patient did followup with Cardiology on 11/09/2021 and recommended continued management and he was stable at that time. Again, the patient was admitted to [facility] from 06/03/2021 until 06/07/2021 with A. Fib with RVR.  He states that he woke up after a nap and began having severe generalized weakness where he could not use his legs.  The patient was brought in to the ER where his wife states his HR was in the 190's and they discovered he was in A. Fib with RVR.  He has a known history of A. Fib where he is followed regularly by myself and cardiology.  While in the hospital, they did place him on a Cardizem drip.  He did not have a repeat ECHO.  They did perform a CTA of his chest which showed no evidence of pulmonary embolism and he had a stable 4.9 AAA that was unchanged.  His heart rate was controlled and they increase his metoprolol from '25mg'$  BID to '50mg'$  BID and started him on Cardizem '60mg'$  TID which did control his heart rate.  Again, cardiology performed a stress ECHO in Oct 2016 which was normal.  They did a ECHO in 02/2017 which showed moderate LVH with a normal LVEF 50-55% without wall motion abnormalities.  He had a repeat ECHO in 09/04/2019 and this showed an LVEF 50-55% with his LV cavity size severely dilated and his LA was severely dilated and there was mild-moderate MVR.  He had AV insuffiency that was moderate and a LV dilatation where they think he may be coming soon to the point of surgery.   Anticoagulation status: He remains on lifelong xarelto therapy per cardiology. CHADVASC2 score is 3.  He denies any bruising, melena, BRBPR or other bleeding problems.  His generalized weakness is resolved and he denies any chest pain, SOB, or peripheral edema.       Past Medical History Past Medical History:  Diagnosis Date   Aneurysm  of aortic root ([place])    Overview:  Last Assessment & Plan:  Planning to see Dr [name] for evaluation of 5cm aneurysm.  - will check PFT in prep for possible procedure / SGY   Aortic regurgitation    Aortic valve insufficiency    Ascending aortic aneurysm (HCC) 09/11/2016   Atrial fibrillation (HCC)    BPH (benign prostatic hyperplasia)    Cardiomyopathy ([place]) 09/11/2016   Coronary artery calcification seen on CT scan 10/13/2017   History of pulmonary embolism 09/24/2012   Overview:  Last Assessment & Plan:  Hx PE x  2 per notes, ? 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  - needs auto-titration study by [employer] or local company, data to RB to adjust his home device.    Preoperative clearance 10/13/2017   Pulmonary embolism (HCC)    Rhinitis      Allergies No Known Allergies   Medications  Current Outpatient Medications:    acetaminophen (TYLENOL) 500 MG tablet, Take 500 mg by mouth every 6 (six) hours as needed for mild pain or moderate pain., Disp: , Rfl:    amoxicillin (AMOXIL) 500 MG capsule, Take 4 tablet 1 hour before dental procedures, Disp: , Rfl:    b complex vitamins capsule, Take 1 capsule by mouth daily. Unknown strength, Disp: , Rfl:    cyclobenzaprine (FLEXERIL) 10 MG tablet, Take 1 tablet (10 mg total) by mouth 3 (three) times daily as needed for up to 12 doses for muscle spasms., Disp: 12 tablet, Rfl: 0   dapagliflozin propanediol (FARXIGA) 5 MG TABS tablet, Take 1 tablet (5 mg total) by mouth daily., Disp: 90 tablet, Rfl:  1   folic acid (FOLVITE) 161 MCG tablet, Take 400 mcg by mouth daily., Disp: , Rfl:    gabapentin (NEURONTIN) 300 MG capsule, Take 300 mg by mouth 3 (three) times daily., Disp: , Rfl:    metoprolol tartrate (LOPRESSOR) 50 MG tablet, Take 1 tablet (50 mg total) by mouth 2 (two) times daily., Disp: 180 tablet, Rfl: 3   Multiple Vitamin (MULTIVITAMINS PO), Take 1 tablet by mouth daily. Unknown strength, Disp: , Rfl:    Omega-3 1000 MG CAPS, Take 1,200 mg by mouth daily., Disp: , Rfl:    oxyCODONE (OXY IR/ROXICODONE) 5 MG immediate release tablet, Take 5 mg by mouth every 6 (six) hours as needed for moderate pain., Disp: , Rfl:    Potassium 99 MG TABS, Take 1 tablet by mouth daily., Disp: , Rfl:    rivaroxaban (XARELTO) 20 MG TABS tablet, TAKE 1 TABLET BY MOUTH ONCE DAILY WITH SUPPER, Disp: 90 tablet, Rfl: 1   rosuvastatin (CRESTOR) 5 MG tablet, Take 1 tablet by mouth once daily, Disp: 90 tablet, Rfl: 2   sacubitril-valsartan (ENTRESTO) 24-26 MG, Take 1 tablet by mouth 2 (two) times daily., Disp: 60 tablet, Rfl: 12   tamsulosin (FLOMAX) 0.4 MG CAPS capsule, Take 0.4 mg by mouth daily after supper., Disp: , Rfl:    TART CHERRY PO, Take 1 tablet by mouth daily. Unknown strenght, Disp: , Rfl:    traMADol (ULTRAM) 50 MG tablet, Take 50 mg by mouth 2 (two) times daily., Disp: , Rfl:    UNABLE TO FIND, Take 2 tablets by mouth daily. Med Name: Curamin/ Unknown strength, Disp: , Rfl:    zinc gluconate 50 MG tablet, Take 50 mg by mouth daily., Disp: , Rfl:    Review of Systems Review of Systems  Constitutional:  Negative for chills, fever and malaise/fatigue.  Eyes:  Negative for blurred vision and double vision.  Respiratory:  Negative for cough, shortness of breath and wheezing.  Cardiovascular:  Negative for chest pain, palpitations and leg swelling.  Gastrointestinal:  Negative for abdominal pain, blood in stool, constipation, diarrhea, heartburn, melena, nausea and vomiting.  Genitourinary:   Negative for hematuria.  Musculoskeletal:  Positive for back pain.  Neurological:  Negative for dizziness, weakness and headaches.       Objective:    Vitals BP 110/70   Pulse 70   Temp 97.6 F (36.4 C)   Resp 16   Ht '5\' 9"'$  (1.753 m)   Wt 181 lb 9.6 oz (82.4 kg)   SpO2 97%   BMI 26.82 kg/m    Physical Examination Physical Exam Constitutional:      Appearance: Normal appearance. He is not ill-appearing.  Cardiovascular:     Rate and Rhythm: Normal rate and regular rhythm.     Pulses: Normal pulses.     Heart sounds: No murmur heard.    No friction rub. No gallop.  Pulmonary:     Effort: Pulmonary effort is normal. No respiratory distress.     Breath sounds: No wheezing, rhonchi or rales.  Abdominal:     General: Abdomen is flat. Bowel sounds are normal. There is no distension.     Palpations: Abdomen is soft.     Tenderness: There is no abdominal tenderness.  Musculoskeletal:     Right lower leg: No edema.     Left lower leg: No edema.  Skin:    General: Skin is warm and dry.     Findings: No rash.  Neurological:     Mental Status: He is alert.        Assessment & Plan:   Lumbar radiculopathy He needs to have cardiac clearance before undergoing this procedure.  He is going to see Dr. Lennox Pippins in the near future and I need to talk to him about when to stop his xarelto.  Otherwise until he is seen by cardiology, I see no reason why he could not undergo surgery but again he needs cardiac clearance.    No follow-ups on file.   [name], MD

## 2022-01-24 NOTE — Progress Notes (Signed)
Virtual Visit via Telephone Note   Because of Aaron Richmond's co-morbid illnesses, he is at least at moderate risk for complications without adequate follow up.  This format is felt to be most appropriate for this patient at this time.  The patient did not have access to video technology/had technical difficulties with video requiring transitioning to audio format only (telephone).  All issues noted in this document were discussed and addressed.  No physical exam could be performed with this format.  Please refer to the patient's chart for his consent to telehealth for [facility].  Evaluation Performed:  Preoperative cardiovascular risk assessment _____________   Date:  01/24/2022   Patient ID:  Aaron Richmond, DOB 18-Dec-1945, MRN [id number] Patient Location:  Home Provider location:   Office  Primary Care Provider:  [name], MD Primary Cardiologist:  [name], MD  Chief Complaint / Patient Profile   77 y.o. y/o male with a h/o cardiomyopathy, HTN, aortic aneurysm, PE who is pending lumbar 4-5, 5-S1 spinal surgery and presents today for telephonic preoperative cardiovascular risk assessment.  History of Present Illness    Aaron Richmond is a 77 y.o. male who presents via audio/video conferencing for a telehealth visit today.  Pt was last seen in cardiology clinic on 11/09/2021 by Dr. [name].  At that time Aaron Richmond was doing well .  The patient is now pending procedure as outlined above. Since his last visit, he remains stable from a cardiac standpoint.  Today he denies chest pain, shortness of breath, lower extremity edema, fatigue, palpitations, melena, hematuria, hemoptysis, diaphoresis, weakness, presyncope, syncope, orthopnea, and PND.   Past Medical History    Past Medical History:  Diagnosis Date   Aneurysm of aortic root ([place])    Overview:  Last Assessment & Plan:  Planning to see Dr [name] for evaluation of 5cm aneurysm.  - will  check PFT in prep for possible procedure / SGY   Aortic regurgitation    Aortic valve insufficiency    Ascending aortic aneurysm (HCC) 09/11/2016   Atrial fibrillation (HCC)    BPH (benign prostatic hyperplasia)    Cardiomyopathy ([place]) 09/11/2016   Coronary artery calcification seen on CT scan 10/13/2017   History of pulmonary embolism 09/24/2012   Overview:  Last Assessment & Plan:  Hx PE x 2 per notes, ? 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  - needs auto-titration study by [employer] or local company, data to RB to adjust his home device.    Preoperative clearance 10/13/2017   Pulmonary embolism (HCC)    Rhinitis    Past Surgical History:  Procedure Laterality Date   APPENDECTOMY     HERNIA REPAIR     TONSILLECTOMY      Allergies  No Known Allergies  Home Medications    Prior to Admission medications   Medication Sig Start Date End Date Taking? Authorizing Provider  acetaminophen (TYLENOL) 500 MG tablet Take 500 mg by mouth every 6 (six) hours as needed for  mild pain or moderate pain.    [provider]  amoxicillin (AMOXIL) 500 MG capsule Take 4 tablet 1 hour before dental procedures 11/23/21   [provider]  b complex vitamins capsule Take 1 capsule by mouth daily. Unknown strength    [provider]  cyclobenzaprine (FLEXERIL) 10 MG tablet Take 1 tablet (10 mg total) by mouth 3 (three) times daily as needed for up to 12 doses for muscle spasms. 11/23/21   [name], MD  dapagliflozin  propanediol (FARXIGA) 5 MG TABS tablet Take 1 tablet (5 mg total) by mouth daily. 12/11/21   [name], MD  folic acid (FOLVITE) 027 MCG tablet Take 400 mcg by mouth daily.    [provider]  gabapentin (NEURONTIN) 300 MG capsule Take 300 mg by mouth 3 (three) times daily. 01/21/22   [provider]  metoprolol tartrate (LOPRESSOR) 50 MG tablet Take 1 tablet (50 mg total) by mouth 2 (two) times daily. 07/11/21   Park Liter, MD  Multiple Vitamin (MULTIVITAMINS PO) Take 1 tablet by mouth daily. Unknown strength    [provider]  Omega-3 1000 MG CAPS Take 1,200 mg by mouth daily.    [provider]  oxyCODONE (OXY IR/ROXICODONE) 5 MG immediate release tablet Take 5 mg by mouth every 6 (six) hours as needed for moderate pain. 12/20/21   [provider]  Potassium 99 MG TABS Take 1 tablet by mouth daily.    [provider]  rivaroxaban (XARELTO) 20 MG TABS tablet TAKE 1 TABLET BY MOUTH ONCE DAILY WITH SUPPER 12/17/21   Revankar, Reita Cliche, MD  rosuvastatin (CRESTOR) 5 MG tablet Take 1 tablet by mouth once daily 01/02/22   Revankar, Reita Cliche, MD  sacubitril-valsartan (ENTRESTO) 24-26 MG Take 1 tablet by mouth 2 (two) times daily. 08/20/21   Revankar, Reita Cliche, MD  tamsulosin (FLOMAX) 0.4 MG CAPS capsule Take 0.4 mg by mouth daily after supper.    [provider]  TART CHERRY PO Take 1 tablet by mouth daily. Unknown strenght    [provider]  traMADol (ULTRAM) 50 MG tablet Take 50 mg by mouth 2 (two) times daily. 01/21/22   [provider]  UNABLE TO FIND Take 2 tablets by mouth daily. Med Name: Curamin/ Unknown strength    [provider]  zinc gluconate 50 MG tablet Take 50 mg by mouth daily.    [provider]    Physical Exam    Vital Signs:  LAYMON STOCKERT does not have vital signs available for review today.  Given telephonic nature of communication, physical exam is limited. AAOx3.  NAD. Normal affect.  Speech and respirations are unlabored.  Accessory Clinical Findings    None  Assessment & Plan    1.  Preoperative Cardiovascular Risk Assessment: Lumbar ESI, Dr. Creig Hines     Primary Cardiologist: [name], MD  Chart reviewed as part of pre-operative protocol coverage. Given past medical history and time since last visit, based on ACC/AHA guidelines, RUPERTO KIERNAN would be at acceptable risk for the planned procedure without further cardiovascular testing.   Patient was advised that if he develops new symptoms prior to surgery to contact our office to arrange a follow-up appointment.  He verbalized understanding.  Patient with diagnosis of afib and PE on Xarelto for anticoagulation.     Procedure: Discectomy, spinal fusion, lumbar 4-5, 5-S1 Date of procedure: TBD  CHA2DS2-VASc Score = 5  This indicates  a 7.2% annual risk of stroke. The patient's score is based upon: CHF History: 1 HTN History: 1 Diabetes History: 0 Stroke History: 0 Vascular Disease History: 1 Age Score: 2 Gender Score: 0   CrCl 26m/min using adjusted body weight  Platelet count 170K   Per office protocol, patient can hold Xarelto for 3 days prior to procedure.     I will route this recommendation to the requesting party via Epic fax function and remove from pre-op pool.     Time:   Today, I have spent 11 minutes with the patient with telehealth technology discussing medical history, symptoms, and management plan.  Prior to his phone evaluation I spent greater than 10 minutes reviewing his past medical history and cardiac medications.   JDeberah Pelton NP  01/24/2022, 8:18 AM

## 2022-01-24 NOTE — Assessment & Plan Note (Signed)
He needs to have cardiac clearance before undergoing this procedure.  He is going to see Dr. Lennox Pippins in the near future and I need to talk to him about when to stop his xarelto.  Otherwise until he is seen by cardiology, I see no reason why he could not undergo surgery but again he needs cardiac clearance.

## 2022-01-24 NOTE — Telephone Encounter (Signed)
Patient still ok to hold Xarelto for 3 days

## 2022-01-24 NOTE — Telephone Encounter (Signed)
Aaron Richmond, I contacted this patient today as part of more preoperative cardiac evaluation.  He informed me that his surgery will be for spinal fusion, microdiscectomy of his lumbar 4-5, 5-S1 region.  Please advise on Xarelto hold.  Thank you for your help.  Jossie Ng. Thadd Apuzzo NP-C     01/24/2022, 2:34 PM [address] Office [phone] Fax [phone]

## 2022-01-28 DIAGNOSIS — Z79899 Other long term (current) drug therapy: Secondary | ICD-10-CM | POA: Diagnosis not present

## 2022-01-28 DIAGNOSIS — M79609 Pain in unspecified limb: Secondary | ICD-10-CM | POA: Diagnosis not present

## 2022-01-28 NOTE — Telephone Encounter (Signed)
  Aaron Richmond with [facility] calling to follow up clearance. She gave fax#  [phone]

## 2022-01-28 NOTE — Telephone Encounter (Signed)
Faxed the clearance note over to fax # given, [phone]

## 2022-01-30 ENCOUNTER — Telehealth: Payer: Self-pay

## 2022-01-30 NOTE — Patient Outreach (Signed)
  Care Coordination   Initial Visit Note   01/30/2022 Name: Aaron Richmond MRN: [id number] DOB: 20-Jun-1945  Aaron Richmond is a 77 y.o. year old male who sees [name], MD for primary care. I spoke with  Eleonore Chiquito by phone today.  What matters to the patients health and wellness today?  Placed call to patient today to review and offer [facility] care coordination program. Patient reports to me that he is self managing well and preparing for upcoming back surgery.  Denies any current needs.      SDOH assessments and interventions completed:  No     Care Coordination Interventions:  No, not indicated   Follow up plan: No further intervention required.   Encounter Outcome:  Pt. Refused   Tomasa Rand, RN, BSN, CEN [facility] [employer] [phone]

## 2022-02-06 DIAGNOSIS — I080 Rheumatic disorders of both mitral and aortic valves: Secondary | ICD-10-CM | POA: Diagnosis not present

## 2022-02-06 DIAGNOSIS — M48062 Spinal stenosis, lumbar region with neurogenic claudication: Secondary | ICD-10-CM | POA: Diagnosis not present

## 2022-02-06 DIAGNOSIS — E669 Obesity, unspecified: Secondary | ICD-10-CM | POA: Diagnosis not present

## 2022-02-06 DIAGNOSIS — M5137 Other intervertebral disc degeneration, lumbosacral region: Secondary | ICD-10-CM | POA: Diagnosis not present

## 2022-02-06 DIAGNOSIS — Z8711 Personal history of peptic ulcer disease: Secondary | ICD-10-CM | POA: Diagnosis not present

## 2022-02-06 DIAGNOSIS — M5116 Intervertebral disc disorders with radiculopathy, lumbar region: Secondary | ICD-10-CM | POA: Diagnosis not present

## 2022-02-06 DIAGNOSIS — I10 Essential (primary) hypertension: Secondary | ICD-10-CM | POA: Diagnosis not present

## 2022-02-06 DIAGNOSIS — M4316 Spondylolisthesis, lumbar region: Secondary | ICD-10-CM | POA: Diagnosis not present

## 2022-02-06 DIAGNOSIS — M4807 Spinal stenosis, lumbosacral region: Secondary | ICD-10-CM | POA: Diagnosis not present

## 2022-02-06 DIAGNOSIS — Z6827 Body mass index (BMI) 27.0-27.9, adult: Secondary | ICD-10-CM | POA: Diagnosis not present

## 2022-02-06 DIAGNOSIS — G4733 Obstructive sleep apnea (adult) (pediatric): Secondary | ICD-10-CM | POA: Diagnosis not present

## 2022-02-06 DIAGNOSIS — M5117 Intervertebral disc disorders with radiculopathy, lumbosacral region: Secondary | ICD-10-CM | POA: Diagnosis not present

## 2022-02-06 DIAGNOSIS — R2689 Other abnormalities of gait and mobility: Secondary | ICD-10-CM | POA: Diagnosis not present

## 2022-02-06 DIAGNOSIS — M4726 Other spondylosis with radiculopathy, lumbar region: Secondary | ICD-10-CM | POA: Diagnosis not present

## 2022-02-06 DIAGNOSIS — M5416 Radiculopathy, lumbar region: Secondary | ICD-10-CM | POA: Diagnosis not present

## 2022-02-06 DIAGNOSIS — Z7901 Long term (current) use of anticoagulants: Secondary | ICD-10-CM | POA: Diagnosis not present

## 2022-02-06 DIAGNOSIS — I4891 Unspecified atrial fibrillation: Secondary | ICD-10-CM | POA: Diagnosis not present

## 2022-02-06 DIAGNOSIS — M5136 Other intervertebral disc degeneration, lumbar region: Secondary | ICD-10-CM | POA: Diagnosis not present

## 2022-02-06 HISTORY — PX: LUMBAR LAMINECTOMY: SHX95

## 2022-02-08 ENCOUNTER — Ambulatory Visit: Payer: PPO

## 2022-02-26 ENCOUNTER — Encounter: Payer: PPO | Admitting: Internal Medicine

## 2022-03-21 DIAGNOSIS — C61 Malignant neoplasm of prostate: Secondary | ICD-10-CM | POA: Diagnosis not present

## 2022-03-28 DIAGNOSIS — C7951 Secondary malignant neoplasm of bone: Secondary | ICD-10-CM | POA: Diagnosis not present

## 2022-03-28 DIAGNOSIS — C61 Malignant neoplasm of prostate: Secondary | ICD-10-CM | POA: Diagnosis not present

## 2022-04-17 ENCOUNTER — Other Ambulatory Visit: Payer: Self-pay | Admitting: Cardiothoracic Surgery

## 2022-04-17 DIAGNOSIS — I7121 Aneurysm of the ascending aorta, without rupture: Secondary | ICD-10-CM

## 2022-04-18 DIAGNOSIS — M5416 Radiculopathy, lumbar region: Secondary | ICD-10-CM | POA: Diagnosis not present

## 2022-04-18 DIAGNOSIS — M533 Sacrococcygeal disorders, not elsewhere classified: Secondary | ICD-10-CM | POA: Diagnosis not present

## 2022-04-18 DIAGNOSIS — M4316 Spondylolisthesis, lumbar region: Secondary | ICD-10-CM | POA: Diagnosis not present

## 2022-05-27 ENCOUNTER — Ambulatory Visit
Admission: RE | Admit: 2022-05-27 | Discharge: 2022-05-27 | Disposition: A | Discharge status: Home / Self Care | Payer: PPO | Source: Ambulatory Visit | Attending: Cardiothoracic Surgery | Admitting: Cardiothoracic Surgery

## 2022-05-27 ENCOUNTER — Ambulatory Visit: Payer: PPO | Admitting: Cardiothoracic Surgery

## 2022-05-27 ENCOUNTER — Encounter: Payer: Self-pay | Admitting: Cardiothoracic Surgery

## 2022-05-27 VITALS — BP 139/62 | HR 97 | Resp 18 | Ht 69.0 in | Wt 180.0 lb

## 2022-05-27 DIAGNOSIS — I7121 Aneurysm of the ascending aorta, without rupture: Secondary | ICD-10-CM

## 2022-05-27 DIAGNOSIS — I700 Atherosclerosis of aorta: Secondary | ICD-10-CM | POA: Diagnosis not present

## 2022-05-27 DIAGNOSIS — I7090 Unspecified atherosclerosis: Secondary | ICD-10-CM

## 2022-05-27 HISTORY — DX: Unspecified atherosclerosis: I70.90

## 2022-05-27 NOTE — Progress Notes (Addendum)
HPI: Patient examined, images of echocardiogram from October 2023 and noncontrasted CT scan of chest performed today personally reviewed and counseled with patient and wife.  77 year old male with known fusiform ascending aneurysm and prior history of prostate cancer, chronic atrial fibrillation, and severe degenerative disease of the spine with recent spinal augmentation and spinal fusion from which he is still recovering and uses a cane.  Patient is being followed for an asymptomatic fusiform ascending aneurysm noted as an incidental finding almost 10 years ago.  The patient has a trileaflet aortic valve with mild to moderate AI and an audible murmur of AI.  He also has mild to moderate MR.  LV function is normal by echo and the patient denies any symptoms of angina or CHF.  He is currently on Entresto for previous diagnosis of heart failure.  Last year the patient sCT scan showed the sinotubular junction to be approximately 4.8 to 5 cm.  Today's measurement is the same at 5 cm. There is no evidence of mural hematoma. There is mild expected associated calcification.  Patient has mild high blood pressure which is very well-controlled with metoprolol.  He also is on low-dose Crestor.  Looking through the medical records his systolic blood pressure has always been in the 110s to 130 mmHg range.  He states he had no cardiac problems while undergoing the back surgery recently at [facility].   Current Outpatient Medications  Medication Sig Dispense Refill   acetaminophen (TYLENOL) 500 MG tablet Take 500 mg by mouth every 6 (six) hours as needed for mild pain or moderate pain.     amoxicillin (AMOXIL) 500 MG capsule Take 4 tablet 1 hour before dental procedures     b complex vitamins capsule Take 1 capsule by mouth daily. Unknown strength     cyclobenzaprine (FLEXERIL) 10 MG tablet Take 1 tablet (10 mg total) by mouth 3 (three) times daily as needed for up to 12 doses for muscle spasms. 12  tablet 0   dapagliflozin propanediol (FARXIGA) 5 MG TABS tablet Take 1 tablet (5 mg total) by mouth daily. 90 tablet 1   folic acid (FOLVITE) 800 MCG tablet Take 400 mcg by mouth daily.     gabapentin (NEURONTIN) 300 MG capsule Take 300 mg by mouth 3 (three) times daily. Patient states he is taking two pills once daily     metoprolol tartrate (LOPRESSOR) 50 MG tablet Take 1 tablet (50 mg total) by mouth 2 (two) times daily. 180 tablet 3   Multiple Vitamin (MULTIVITAMINS PO) Take 1 tablet by mouth daily. Unknown strength     Omega-3 1000 MG CAPS Take 1,200 mg by mouth daily.     oxyCODONE (OXY IR/ROXICODONE) 5 MG immediate release tablet Take 5 mg by mouth every 6 (six) hours as needed for moderate pain.     Potassium 99 MG TABS Take 1 tablet by mouth daily.     rivaroxaban (XARELTO) 20 MG TABS tablet TAKE 1 TABLET BY MOUTH ONCE DAILY WITH SUPPER 90 tablet 1   rosuvastatin (CRESTOR) 5 MG tablet Take 1 tablet by mouth once daily 90 tablet 2   sacubitril-valsartan (ENTRESTO) 24-26 MG Take 1 tablet by mouth 2 (two) times daily. 60 tablet 12   tamsulosin (FLOMAX) 0.4 MG CAPS capsule Take 0.4 mg by mouth daily after supper.     TART CHERRY PO Take 1 tablet by mouth daily. Unknown strenght     traMADol (ULTRAM) 50 MG tablet Take 50 mg by mouth 2 (two)  times daily.     UNABLE TO FIND Take 2 tablets by mouth daily. Med Name: Curamin/ Unknown strength     zinc gluconate 50 MG tablet Take 50 mg by mouth daily.     No current facility-administered medications for this visit.     Physical Exam: Blood pressure 139/62, pulse 97, resp. rate 18, height 5\' 9"  (1.753 m), weight 180 lb (81.6 kg), SpO2 94 %.         Exam    General- alert 77 year old male who appears somewhat frail and uncomfortable sitting in the chair due to his recent back surgery.    Neck- no JVD, no cervical adenopathy palpable, no carotid bruit   Lungs- clear without rales, wheezes   Cor-irregular heart rate and rhythm, 2/6 AI  murmur , no gallop   Abdomen- soft, non-tender   Extremities - warm, non-tender, minimal edema   Neuro- oriented, appropriate, no focal weakness  Diagnostic Tests: CT scan of the chest images today show the sinotubular junction measured 5 cm in diameter, basically stable.  No mediastinal adenopathy.  No pulmonary nodules.  Impression: 77 year old male still recovering from recent spinal augmentation /fusion,  non surgically treated prostate cancer, and currently well-controlled blood pressure with a stable 5 cm ascending aorta.Although the risk of dissection begins to increase at this diameter,I do not feel this patient would currently benefit from surgical therapy of his thoracic aortic disease. In my judgement his risk for resection and grafting of the ascending aorta with concomitant aortic valve replacement using hypothermic circulatory arrest would be significantly higher than the risk of spontaneous dissection with continued monitoring and blood pressure control.  The patient should continue getting his blood pressure checked to keep it less than 140 mmHg systolic and continue with his current statin and cardiac meds.  We also discussed that he should avoid fluoroquinolone antibiotics including Cipro and Levaquin which can weaken the connective tissue in the aortic wall.  We will increase the interval of scans to 6 months to follow the ascending aortic diameter.  Plan: Return in 6 months for repeat CT scan of the chest.   Lovett Sox, MD [facility] [phone]

## 2022-06-14 ENCOUNTER — Encounter: Payer: Self-pay | Admitting: Internal Medicine

## 2022-06-14 ENCOUNTER — Ambulatory Visit: Payer: PPO | Admitting: Internal Medicine

## 2022-06-14 VITALS — BP 118/60 | HR 103 | Temp 98.3°F | Resp 16 | Ht 69.0 in | Wt 185.0 lb

## 2022-06-14 DIAGNOSIS — Z6827 Body mass index (BMI) 27.0-27.9, adult: Secondary | ICD-10-CM

## 2022-06-14 DIAGNOSIS — J302 Other seasonal allergic rhinitis: Secondary | ICD-10-CM

## 2022-06-14 DIAGNOSIS — I10 Essential (primary) hypertension: Secondary | ICD-10-CM

## 2022-06-14 DIAGNOSIS — N400 Enlarged prostate without lower urinary tract symptoms: Secondary | ICD-10-CM | POA: Diagnosis not present

## 2022-06-14 DIAGNOSIS — E7800 Pure hypercholesterolemia, unspecified: Secondary | ICD-10-CM

## 2022-06-14 DIAGNOSIS — I351 Nonrheumatic aortic (valve) insufficiency: Secondary | ICD-10-CM | POA: Diagnosis not present

## 2022-06-14 DIAGNOSIS — C61 Malignant neoplasm of prostate: Secondary | ICD-10-CM | POA: Insufficient documentation

## 2022-06-14 DIAGNOSIS — Z86711 Personal history of pulmonary embolism: Secondary | ICD-10-CM

## 2022-06-14 DIAGNOSIS — I2699 Other pulmonary embolism without acute cor pulmonale: Secondary | ICD-10-CM

## 2022-06-14 DIAGNOSIS — Z8679 Personal history of other diseases of the circulatory system: Secondary | ICD-10-CM

## 2022-06-14 DIAGNOSIS — I700 Atherosclerosis of aorta: Secondary | ICD-10-CM

## 2022-06-14 DIAGNOSIS — M5416 Radiculopathy, lumbar region: Secondary | ICD-10-CM

## 2022-06-14 DIAGNOSIS — I7121 Aneurysm of the ascending aorta, without rupture: Secondary | ICD-10-CM

## 2022-06-14 DIAGNOSIS — N1831 Chronic kidney disease, stage 3a: Secondary | ICD-10-CM | POA: Insufficient documentation

## 2022-06-14 DIAGNOSIS — Z0000 Encounter for general adult medical examination without abnormal findings: Secondary | ICD-10-CM | POA: Diagnosis not present

## 2022-06-14 DIAGNOSIS — M159 Polyosteoarthritis, unspecified: Secondary | ICD-10-CM | POA: Diagnosis not present

## 2022-06-14 DIAGNOSIS — I340 Nonrheumatic mitral (valve) insufficiency: Secondary | ICD-10-CM

## 2022-06-14 DIAGNOSIS — I4891 Unspecified atrial fibrillation: Secondary | ICD-10-CM | POA: Diagnosis not present

## 2022-06-14 DIAGNOSIS — M150 Primary generalized (osteo)arthritis: Secondary | ICD-10-CM

## 2022-06-14 DIAGNOSIS — M4316 Spondylolisthesis, lumbar region: Secondary | ICD-10-CM

## 2022-06-14 DIAGNOSIS — G4733 Obstructive sleep apnea (adult) (pediatric): Secondary | ICD-10-CM

## 2022-06-14 DIAGNOSIS — C7951 Secondary malignant neoplasm of bone: Secondary | ICD-10-CM

## 2022-06-14 HISTORY — DX: Other seasonal allergic rhinitis: J30.2

## 2022-06-14 HISTORY — DX: Nonrheumatic mitral (valve) insufficiency: I34.0

## 2022-06-14 HISTORY — DX: Body mass index (BMI) 27.0-27.9, adult: Z68.27

## 2022-06-14 HISTORY — DX: Primary generalized (osteo)arthritis: M15.0

## 2022-06-14 HISTORY — DX: Polyosteoarthritis, unspecified: M15.9

## 2022-06-14 HISTORY — DX: Chronic kidney disease, stage 3a: N18.31

## 2022-06-14 HISTORY — DX: Secondary malignant neoplasm of bone: C61

## 2022-06-14 HISTORY — DX: Atherosclerosis of aorta: I70.0

## 2022-06-14 HISTORY — DX: Pure hypercholesterolemia, unspecified: E78.00

## 2022-06-14 HISTORY — DX: Essential (primary) hypertension: I10

## 2022-06-14 NOTE — Assessment & Plan Note (Signed)
He has a history of PE but no recurrent venous thromboembolisms since.

## 2022-06-14 NOTE — Assessment & Plan Note (Signed)
We will continue on his crestor and check his FLP today.

## 2022-06-14 NOTE — Progress Notes (Signed)
Preventive Screening-Counseling & Management     Aaron Richmond is a 77 y.o. male who presents for Medicare Annual/Subsequent preventive examination.  Aaron Richmond is a 77 year old Caucasian/White male who presents for his annual wellness exam. He is due for the following health maintenance studies: vision exam and screening labs. This patient's past medical history Aortic Atherosclerosis, Aortic Regurgitation, Aortic Root Aneurysm, Atrial Fibrillation, Benign Prostatic Hypertrophy, Hyperlipidemia, Hypertension, Benign Essential, Mitral Regurgitation, Obesity, Pulmonary Embolism, Rhinitis, Allergic, and Sleep Apnea, Obstructive.   His last eye exam was 02/2020 where his vision is doing well and they wanted to do eyelid surgery on him this year. He has had cataract surgery in the past. His last colonoscopy was done in 04/2014 and this showed diverticulosis. They want to repeat this in 10 years. He does have BPH and prostate cancer and remains on flomax.  He was on proscar but he states urology stopped this.  He denies any problems with urination. He does exercise by walking and home exercises. He does get yearly flu vaccines. He had a pneumovax 23 and zostavax vaccine on 02/2012. He did have a prevnar 13 vaccine in 2017. He completed a shingrix vaccine in 2017. He has had 2 COVID-19 vaccines with no boosters.  He is not interested in the RSV vaccines.  He denies any depression, anxiety or memory loss. He is not on an ASA but he is on Xarelto 20mg  daily.   This past year in 01/2022, Aaron Richmond underwent lumbar fusion at L4-S1 on 02/06/2022 for spondylolisthesis of lumbar region with lumbar radiculopathy.  They allowed him to be WBAT.  He has seen them in followup and has been doing well.  I did see the patient in 12/2021 where 3 weeks prior to that he came in with left lower back pain.  He told me at that time that he was going to the kitchen sink about 5 days earlier and he heard his back pop.  He  did not bend over but he began having a sharp/dull aching pain that has been continuous. He denied any new weakness/numbness and no loss of bowel/bladder control.  I gave him a medrol dose pak and cyclobenzaprine at that time which did resolve his left lower back pain. He has had this pain in the past where he tells me has a herniated disc of his lower lumbar spine.  He has seen Dr. Loralie Champagne who states he has grade 1 spondylolithesis of L4-L5 with right lower extremity radiation.  He saw Dr. Loralie Champagne 10/2019 and he did have an ESI of his lumbar spine.  This did help with his pain considerably at that time.  He had a trigger point injection around 11/2021 and this also helped with his lower back pain.  We sent him to ortho in the past due to right knee pain.  Ortho did xrays which showed arthritis of his right knee and they did a cortisone injection at that time.  Again, he had a right knee replacement 01/2018.  He is supposed to be on celebrex where I stopped him on meloxicam but he is still taking left over meloxicam.  He did see radiology on 12/07/2021 and he tells me they did another ESI.  The patient states this did not help.  He did have a repeat MRI of his lumbar spine on 12/17/2021 and this showed interval development of an acute/subacute L1 compression fracture with mild osseous retropulsion and was associated with a vertical fracture through  the right pedicle.  There was unchanged mild to moderate spinal stenosis at L4-L5 and other changes.   He had a kyphoplasty performed  on 12/19/2021 with Dr. Loralie Champagne which he states did help some with his pain.    Aaron Richmond is a 77 yo male  who returns for followup of his A. Fib.  He did see cardiology in followup on 01/2022 for preoperative clearance for his surgery above.   He also saw cardiology in 08/16/2021 where he had a repeat ECHO done on 08/07/2021.  He now has a cardiomyopathy where his ECHO showed that he had a mild to moderate decrease his LV function with a LVEF  of 40-45%.  He has LV hypokinesis and a LV internal cavity size that is mildly dilated.  He has moderate concentric LVH and his left diastolic parameters were indeterminate.  His LA is severely dilated and he has moderate MR and AV.  Dr. Tomie China asked him to stop his losartan and started him on  entresto 24/26mg  BID which he has done and denies any problems.  His BP has been stable at home.   He also asked him to stop his diltiazem since going on entresto as well.  They did do a repeat ECHO on 10/09/2021 and this showed an improvement in his EF where his EF went up to 50-55% and low normal systolic function.  There was no regional wall motion abnormalities. The left ventricular internal cavity size was mildly to moderately dilated. His Left ventricular diastolic parameters are indeterminate.  His right ventricular systolic function is normal. The right ventricular  size is normal. There is normal pulmonary artery systolic pressure. His left atrial size was severely dilated. He mild to moderate mitral valve regurgitation.  He has an aneurysm of the ascending aorta, measuring 51 mm. The patient did followup with Cardiology on 11/09/2021 and recommended continued management and he was stable at that time. Again, the patient was admitted to [facility] from 06/03/2021 until 06/07/2021 with A. Fib with RVR.  He states that he woke up after a nap and began having severe generalized weakness where he could not use his legs.  The patient was brought in to the ER where his wife states his HR was in the 190's and they discovered he was in A. Fib with RVR.  He has a known history of A. Fib where he is followed regularly by myself and cardiology.  While in the hospital, they did place him on a Cardizem drip.  He did not have a repeat ECHO.  They did perform a CTA of his chest which showed no evidence of pulmonary embolism and he had a stable 4.9 AAA that was unchanged.  His heart rate was controlled and they increase his  metoprolol from 25mg  BID to 50mg  BID and started him on Cardizem 60mg  TID which did control his heart rate.  Again, cardiology performed a stress ECHO in Oct 2016 which was normal.  They did a ECHO in 02/2017 which showed moderate LVH with a normal LVEF 50-55% without wall motion abnormalities.  He had a repeat ECHO in 09/04/2019 and this showed an LVEF 50-55% with his LV cavity size severely dilated and his LA was severely dilated and there was mild-moderate MVR.  He had AV insuffiency that was moderate and a LV dilatation where they think he may be coming soon to the point of surgery.   Anticoagulation status: He remains on lifelong xarelto therapy per cardiology. CHADVASC2 score is  3.  He denies any bruising, melena, BRBPR or other bleeding problems.  His generalized weakness is resolved and he denies any chest pain, SOB, or peripheral edema.    He does have an aneurysm of the ascending aorta where he saw cardiothoracic surgery on 05/27/2022.  He had CT scan of chest on 05/09/2022 that showed a stable ascending thoracic aortic aneurysm measuring up to 5.1 cm in diameter. There was coronary and Aortic Atherosclerosis seen. They felt he has an asymptomatic fusiform ascending aneurysm noted as an incidental finding almost 10 years ago.  They felt this aneurysm was stable at 5 cm.  Although the risk of dissection begins to increase at this diameter,I do not feel this patient would currently benefit from surgical therapy of his thoracic aortic disease.  They felt in their judgement that his risk for resection and grafting of the ascending aorta with concomitant aortic valve replacement using hypothermic circulatory arrest would be significantly higher than the risk of spontaneous dissection with continued monitoring and blood pressure control.  The patient should continue getting his blood pressure checked to keep it less than 140 mmHg systolic and continue with his current statin and cardiac meds.  We also discussed  that he should avoid fluoroquinolone antibiotics including Cipro and Levaquin which can weaken the connective tissue in the aortic wall.  They will increase the interval of scans to 6 months to follow the ascending aortic diameter.   The patient is a 77 year old Caucasian/White male who presents for a follow-up evaluation of hypertension.  Since his last visit, he states he has not had any problems.   The patient has not been checking his blood pressure at home.  The patient's current medications include:  metoprolol tartrate 50 mg BID and entresto 24/26 BID. The patient has been tolerating his medications well. The patient denies any headache, visual changes, dizziness, lightheadness, chest pain, shortness of breath, weakness/numbness, and edema.  He reports there have been no other symptoms noted.   He does have a history of Stage II-III CKD which has been going on for the last 6-7 years.  He has been stable and his GFR has been running in the 60's with a creatinine baseline 1.2-1.3.  I started him on farxiga for renal protection in 2022 and he states he is not having any problems or side effects.  He is avoiding NSAIDS.   Aaron Hora. Richmond returns today for routine followup on his cholesterol.  We have noted his triglyceride levels have been elevated and I wanted him to watch his diet and exercise.  He did see cardiology in November as well and we both asked him to increase his crestor 5mg  3 times a week to every day.  Overall, he states he is doing well and is without any complaints or problems at this time. Patient is exercising and walking daily. He specifically denies chest pain, abdominal pain, nausea, diarrhea, and myalgias. He remains on dietary management as well as a regular exercise program and the following cholesterol lowering medications Fish Oil 360-1,200 mg Oral Capsule and he remains on crestor 5mg  daily.  He states he was tried on one statin several years ago but he had myalgias.   Mrs.  Richmond has active prostate cancer at this time where he is currently followed by [facility].  Aaron Richmond also saw cardiothoracic surgeon for followup of his aortic aneurysm.  They did a CT angiogram and found incidental findings of a L1 and T4 bony  lytic lesion in 08/2019.  He subsequently underwent a PET scan which revealed findings suspicious for metastatic disease with sclerotic lesions and a hypermetabolic lymph node in his left pelvis.  He did see urology and underwent a prostate biopsy where they adenocarcinoma of his prostate which was diagnosed in 06/2020.  He had a sclerotic lesion in T4 from a CTA this past year.  A PET showed left prostate activity, a left common iliac lymphadenopathy, a sclerotic lesion of L4 and his right ischium.  He saw urology and underwent chemotherapy in 7/22 and 8/22 of this year.  They placed him on Eliqguard and did a repeat PET scan in 08/2020 and this showed a decreased size of his left iliac lymph node, he had 3.2 cm L1 met, a small right ischial met, a left manubrium and right pelvis met.  The did undergo radiation therapy and saw his oncologist where he was placed on injections and Xtandi.  However, he was hospitalized in 05/2021 with A. Fib and they felt his Diana Eves was the culprit.  He last saw urology on 03/28/2022 for his prostate cancer where he stopped Xtandi and medications in 2023 due to intolerance.  His PSA had a minor increase but they will continue to follow him every 6 months.  He does have a history of hearing loss where he has hearing aids.  He has had bilateral hearing aids for the last 8 years.  He does not know the cause of his hearing loss but he denies any environmental exposure to loud noises when he was younger.  He denies any tinnitus or ear pain.  He saw his audiologist where they repeated a hearing test which worsened mildly per the patient.     Aaron Richmond was also diagnosed with a pulmonary embolism in 2008.  He was treated several months with  anticoagulation.  They were never able to find out why he had a PE but he believes he was on a long plane ride and this may have been the cause.  There has never been any other history of blood clots.  There is no SOB at this time.  He is on lifelong Xarelto therapy at this time.     Aaron Richmond also has a history of a cholesteatoma where he had surgery.  He had surgery on his left ear where ENT's notes state this is healed.  Mr. Kidder states that he now has muffled hearing in that ear.  There is no pain and no tinnitus.  The patient now had hearing aids.    He does have a history of allergies where he is Zyrtec which moderately controls his symptoms.  He states that his allergy symptoms can last all year long.  He is not on zyrtec at this time and he denies any problems.     He does have a history of OSA which was diagnosed around 2010.  He remains on CPAP at night.  Otherwise he denies any problems.  He states his machine is old and he wants to see a pulmonologist.      Are there smokers in your home (other than you)? No  Risk Factors Current exercise habits:  as above   Dietary issues discussed: none   Depression Screen (Note: if answer to either of the following is "Yes", a more complete depression screening is indicated)   Over the past two weeks, have you felt down, depressed or hopeless? No  Over the past two weeks, have you felt  little interest or pleasure in doing things? No  Have you lost interest or pleasure in daily life? No  Do you often feel hopeless? No  Do you cry easily over simple problems? No  Activities of Daily Living In your present state of health, do you have any difficulty performing the following activities?:  Driving? No Managing money?  No Feeding yourself? No Getting from bed to chair? No Climbing a flight of stairs? Yes Preparing food and eating?: No Bathing or showering? No Getting dressed: No Getting to the toilet? No Using the toilet:No Moving  around from place to place: No In the past year have you fallen or had a near fall?:Yes   Are you sexually active?  No  Do you have more than one partner?  No  Hearing Difficulties: Yes Do you often ask people to speak up or repeat themselves? Yes Do you experience ringing or noises in your ears? No Do you have difficulty understanding soft or whispered voices? Yes   Do you feel that you have a problem with memory? No  Do you often misplace items? No  Do you feel safe at home?  Yes  Cognitive Testing  Alert? Yes  Normal Appearance?Yes  Oriented to person? Yes  Place? Yes   Time? Yes  Recall of three objects?  Yes  Can perform simple calculations? Yes  Displays appropriate judgment?Yes  Can read the correct time from a watch face?Yes  Fall Risk Prevention  Any stairs in or around the home? Yes  If so, are there any without handrails? No  Home free of loose throw rugs in walkways, pet beds, electrical cords, etc? Yes  Adequate lighting in your home to reduce risk of falls? Yes  Use of a cane, walker or w/c? Yes - he uses a 4 point cane for stability   Time Up and Go  Was the test performed? Yes .  Length of time to ambulate 10 feet: 17 sec.   Gait slow and steady with assistive device    Advanced Directives have been discussed with the patient? Yes   List the Names of Other Physician/Practitioners you currently use: Patient Care Team: Crist Fat, MD as PCP - General (Internal Medicine) Lars Masson, MD as PCP - Cardiology (Cardiology) Leonia Reader, Barbara Cower, MD as Referring Physician (Specialist) Delight Ovens, MD (Inactive) as Consulting Physician (Cardiothoracic Surgery) Leslye Peer, MD as Consulting Physician (Pulmonary Disease) Lars Masson, MD as Consulting Physician (Cardiology)    Past Medical History:  Diagnosis Date   Aneurysm of aortic root [facility])    Overview:  Last Assessment & Plan:  Planning to see Dr Tyrone Sage for evaluation of 5cm  aneurysm.  - will check PFT in prep for possible procedure / SGY   Aortic regurgitation    Aortic valve insufficiency    Ascending aortic aneurysm (HCC) 09/11/2016   Atrial fibrillation (HCC)    BPH (benign prostatic hyperplasia)    Cardiomyopathy (HCC) 09/11/2016   Coronary artery calcification seen on CT scan 10/13/2017   History of pulmonary embolism 09/24/2012   Overview:  Last Assessment & Plan:  Hx PE x 2 per notes, ? 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  - needs auto-titration study by [employer] or local company, data to RB to adjust his home device.    Preoperative clearance 10/13/2017   Pulmonary embolism (HCC)    Rhinitis     Past Surgical History:  Procedure Laterality Date   APPENDECTOMY     HERNIA REPAIR     TONSILLECTOMY        Current Medications  Current Outpatient Medications  Medication Sig Dispense Refill   acetaminophen (TYLENOL) 500 MG tablet Take 500 mg by mouth every 6 (six) hours as needed for mild pain or moderate pain.     dapagliflozin propanediol (FARXIGA) 5 MG TABS tablet Take 1 tablet (5 mg total) by mouth daily. 90 tablet 1   folic acid (FOLVITE) 800 MCG tablet Take 400 mcg by mouth daily.     gabapentin (NEURONTIN) 300 MG capsule Take 300 mg by mouth at bedtime. Patient states he is taking two pills once daily     metoprolol tartrate (LOPRESSOR) 50 MG tablet Take 1 tablet (50 mg total) by mouth 2 (two) times daily. 180 tablet 3   Multiple Vitamin (MULTIVITAMINS PO) Take 1 tablet by mouth daily. Unknown  strength     Omega-3 1000 MG CAPS Take 1,200 mg by mouth daily.     Potassium 99 MG TABS Take 1 tablet by mouth daily.     rivaroxaban (XARELTO) 20 MG TABS tablet TAKE 1 TABLET BY MOUTH ONCE DAILY WITH SUPPER 90 tablet 1   rosuvastatin (CRESTOR) 5 MG tablet Take 1 tablet by mouth once daily 90 tablet 2   sacubitril-valsartan (ENTRESTO) 24-26 MG Take 1 tablet by mouth 2 (two) times daily. 60 tablet 12   tamsulosin (FLOMAX) 0.4 MG CAPS capsule Take 0.4 mg by mouth daily after supper.     TART CHERRY PO Take 1 tablet by mouth daily. Unknown strenght     UNABLE TO FIND Take 2 tablets by mouth daily. Med Name: Curamin/ Unknown strength     zinc gluconate 50 MG tablet Take 50 mg by mouth daily.     No current facility-administered medications for this visit.    Allergies Ciprofloxacin   Social History Social History   Tobacco Use   Smoking status: Never   Smokeless tobacco: Never  Substance Use Topics   Alcohol use: No     Review of Systems Review of Systems  Constitutional:  Positive for weight loss. Negative for chills, fever and malaise/fatigue.  HENT:  Positive for hearing loss. Negative for tinnitus.   Eyes:  Negative for blurred vision and double vision.  Respiratory:  Negative for cough, hemoptysis, shortness of breath and wheezing.   Cardiovascular:  Negative for chest pain, palpitations and leg swelling.  Gastrointestinal:  Negative for abdominal pain, blood in stool, constipation, diarrhea, heartburn, melena, nausea and vomiting.  Genitourinary:  Negative for frequency and hematuria.  Musculoskeletal:  Positive for back pain.  Skin:  Negative for itching and rash.  Neurological:  Negative for dizziness, weakness and headaches.  Psychiatric/Behavioral:  Negative for depression. The patient is not nervous/anxious.      Physical Exam:      Body mass index is 27.32 kg/m. BP 118/60   Pulse (!) 103   Temp 98.3 F (36.8 C)   Resp 16   Ht 5\' 9"  (1.753 m)  Wt 185 lb  (83.9 kg)   SpO2 97%   BMI 27.32 kg/m   Physical Exam Constitutional:      Appearance: Normal appearance. He is not ill-appearing.  HENT:     Head: Normocephalic and atraumatic.     Right Ear: Tympanic membrane, ear canal and external ear normal.     Left Ear: Tympanic membrane, ear canal and external ear normal.     Nose: Nose normal. No congestion or rhinorrhea.     Mouth/Throat:     Mouth: Mucous membranes are dry.     Pharynx: Oropharynx is clear. No oropharyngeal exudate or posterior oropharyngeal erythema.  Eyes:     General: No scleral icterus.    Conjunctiva/sclera: Conjunctivae normal.     Pupils: Pupils are equal, round, and reactive to light.  Neck:     Vascular: No carotid bruit.  Cardiovascular:     Rate and Rhythm: Normal rate and regular rhythm.     Pulses: Normal pulses.     Heart sounds: No murmur heard.    No friction rub. No gallop.  Pulmonary:     Effort: Pulmonary effort is normal. No respiratory distress.     Breath sounds: No wheezing, rhonchi or rales.  Abdominal:     General: Abdomen is flat. Bowel sounds are normal. There is no distension.     Palpations: Abdomen is soft.     Tenderness: There is no abdominal tenderness.  Musculoskeletal:     Cervical back: Neck supple. No tenderness.     Right lower leg: No edema.     Left lower leg: No edema.  Lymphadenopathy:     Cervical: No cervical adenopathy.  Skin:    General: Skin is warm and dry.     Findings: No rash.  Neurological:     General: No focal deficit present.     Mental Status: He is alert and oriented to person, place, and time.  Psychiatric:        Mood and Affect: Mood normal.        Behavior: Behavior normal.      Assessment:      Prostate cancer metastatic to bone [facility])  Aortic atherosclerosis (HCC)  Aortic valve insufficiency, etiology of cardiac valve disease unspecified  Aneurysm of aortic root (HCC)  Essential hypertension  Atrial fibrillation, unspecified type  (HCC)  Stage 3a chronic kidney disease (HCC)  Hypercholesterolemia  Benign prostatic hyperplasia without lower urinary tract symptoms  History of cardiomyopathy  History of pulmonary embolism  Mitral valve insufficiency, unspecified etiology  Primary osteoarthritis involving multiple joints  Spondylolisthesis of lumbar region  Seasonal allergies  BMI 27.0-27.9,adult  Aneurysm of ascending aorta without rupture (HCC)  Pulmonary embolism, unspecified chronicity, unspecified pulmonary embolism type, unspecified whether acute cor pulmonale present (HCC)  Obstructive sleep apnea syndrome  Lumbar radiculopathy  Malignant neoplasm of prostate metastatic to bone [facility])    Plan:     During the course of the visit the patient was educated and counseled about appropriate screening and preventive services including:   Pneumococcal vaccine  Influenza vaccine Colorectal cancer screening  Diet review for nutrition referral? Yes ____  Not Indicated _X___   Patient Instructions (the written plan) was given to the patient.  Aortic atherosclerosis (HCC) We will continue on his crestor and check his FLP today.  Ascending aortic aneurysm [facility]) Cardiothoracic surgery has seen him and his aneurysm was mildly increased.  They do not recommend surgery at this time and they are going  to start following him now every 6 months.  Atrial fibrillation His A. Fib remains rate controlled.  He is on lifelong anticoagulation with xarelto.  History of cardiomyopathy He has a history of cardiomyopathy where his EF improved in late 2023 with the addition of entresto.  His BP has been stable and he has no evidence of volume overload at this time.  I have asked him to reduce his salt intake.  Essential hypertension His BP is currently controlled.  We will continue his current medications.  Pulmonary embolism (HCC) He has a history of PE but no recurrent venous thromboembolisms since.     Obstructive sleep apnea syndrome He is compliant with his CPAP but states his machine is old.  We will refer him to pulmonary for evaluation.  Lumbar radiculopathy He is s/p surgery and states his back pain is mild and is improving daily.    Malignant neoplasm of prostate metastatic to bone [facility]) I reviewed his notes from urology.  They are monitoring his PSA at this time and he is not on active medications for his prostate cancer as he did not tolerate his previous meds.  Primary osteoarthritis involving multiple joints He can use tylenol as needed for any pain.  Spondylolisthesis of lumbar region Plan as above .  BPH (benign prostatic hyperplasia) He denies any symptoms and remains on flomax today.  Stage 3a chronic kidney disease (HCC) He has CKD where he is on farxiga and he has an ARB with entresto.  I have asked him to avoid all NSAIDS.  BMI 27.0-27.9,adult He has lost weight with all his surgeries.  I want him to eat healthy and exercise as he can.  Hypercholesterolemia We will check his cholesterol today.  He remains on a statin.  Seasonal allergies This is not an active problems today.    Prevention   Medicare Attestation I have personally reviewed: The patient's medical and social history Their use of alcohol, tobacco or illicit drugs Their current medications and supplements The patient's functional ability including ADLs,fall risks, home safety risks, cognitive, and hearing and visual impairment Diet and physical activities Evidence for depression or mood disorders  The patient's weight, height, and BMI have been recorded in the chart.  I have made referrals, counseling, and provided education to the patient based on review of the above and I have provided the patient with a written personalized care plan for preventive services.     Crist Fat, MD   06/14/2022

## 2022-06-14 NOTE — Assessment & Plan Note (Signed)
I reviewed his notes from urology.  They are monitoring his PSA at this time and he is not on active medications for his prostate cancer as he did not tolerate his previous meds.

## 2022-06-14 NOTE — Assessment & Plan Note (Signed)
His BP is currently controlled.  We will continue his current medications. 

## 2022-06-14 NOTE — Assessment & Plan Note (Signed)
He is s/p surgery and states his back pain is mild and is improving daily.

## 2022-06-14 NOTE — Assessment & Plan Note (Signed)
His A. Fib remains rate controlled.  He is on lifelong anticoagulation with xarelto.

## 2022-06-14 NOTE — Assessment & Plan Note (Signed)
He has a history of cardiomyopathy where his EF improved in late 2023 with the addition of entresto.  His BP has been stable and he has no evidence of volume overload at this time.  I have asked him to reduce his salt intake.

## 2022-06-14 NOTE — Assessment & Plan Note (Signed)
He is compliant with his CPAP but states his machine is old.  We will refer him to pulmonary for evaluation.

## 2022-06-14 NOTE — Assessment & Plan Note (Signed)
We will check his cholesterol today.  He remains on a statin.

## 2022-06-14 NOTE — Assessment & Plan Note (Signed)
He has lost weight with all his surgeries.  I want him to eat healthy and exercise as he can.

## 2022-06-14 NOTE — Assessment & Plan Note (Signed)
Plan as above.  

## 2022-06-14 NOTE — Assessment & Plan Note (Signed)
This is not an active problems today.

## 2022-06-14 NOTE — Assessment & Plan Note (Signed)
He denies any symptoms and remains on flomax today.

## 2022-06-14 NOTE — Assessment & Plan Note (Signed)
He can use tylenol as needed for any pain.

## 2022-06-14 NOTE — Assessment & Plan Note (Signed)
Cardiothoracic surgery has seen him and his aneurysm was mildly increased.  They do not recommend surgery at this time and they are going to start following him now every 6 months.

## 2022-06-14 NOTE — Assessment & Plan Note (Signed)
He has CKD where he is on farxiga and he has an ARB with entresto.  I have asked him to avoid all NSAIDS.

## 2022-06-16 LAB — TSH: TSH: 1.4 u[IU]/mL (ref 0.450–4.500)

## 2022-06-16 LAB — LIPID PANEL
Chol/HDL Ratio: 2.7 ratio (ref 0.0–5.0)
Cholesterol, Total: 122 mg/dL (ref 100–199)
HDL: 45 mg/dL (ref >39)
LDL Chol Calc (NIH): 60 mg/dL (ref 0–99)
Triglycerides: 85 mg/dL (ref 0–149)
VLDL Cholesterol Cal: 17 mg/dL (ref 5–40)

## 2022-06-16 LAB — CBC WITH DIFFERENTIAL/PLATELET
Basophils Absolute: 0 10*3/uL (ref 0.0–0.2)
Basos: 0 %
EOS (ABSOLUTE): 0 10*3/uL (ref 0.0–0.4)
Eos: 0 %
Hematocrit: 37.3 % — ABNORMAL LOW (ref 37.5–51.0)
Hemoglobin: 12.3 g/dL — ABNORMAL LOW (ref 13.0–17.7)
Immature Grans (Abs): 0 10*3/uL (ref 0.0–0.1)
Immature Granulocytes: 0 %
Lymphocytes Absolute: 1.1 10*3/uL (ref 0.7–3.1)
Lymphs: 15 %
MCH: 32.1 pg (ref 26.6–33.0)
MCHC: 33 g/dL (ref 31.5–35.7)
MCV: 97 fL (ref 79–97)
Monocytes Absolute: 0.8 10*3/uL (ref 0.1–0.9)
Monocytes: 10 %
Neutrophils Absolute: 5.6 10*3/uL (ref 1.4–7.0)
Neutrophils: 75 %
Platelets: 202 10*3/uL (ref 150–450)
RBC: 3.83 x10E6/uL — ABNORMAL LOW (ref 4.14–5.80)
RDW: 14.3 % (ref 11.6–15.4)
WBC: 7.5 10*3/uL (ref 3.4–10.8)

## 2022-06-16 LAB — MICROALBUMIN / CREATININE URINE RATIO
Creatinine, Urine: 87.9 mg/dL
Microalb/Creat Ratio: 11 mg/g creat (ref 0–29)
Microalbumin, Urine: 9.9 ug/mL

## 2022-06-16 LAB — CMP14 + ANION GAP
ALT: 11 IU/L (ref 0–44)
AST: 16 IU/L (ref 0–40)
Albumin/Globulin Ratio: 2.1 (ref 1.2–2.2)
Albumin: 4 g/dL (ref 3.8–4.8)
Alkaline Phosphatase: 65 IU/L (ref 44–121)
Anion Gap: 15 mmol/L (ref 10.0–18.0)
BUN/Creatinine Ratio: 27 — ABNORMAL HIGH (ref 10–24)
BUN: 20 mg/dL (ref 8–27)
Bilirubin Total: 0.5 mg/dL (ref 0.0–1.2)
CO2: 25 mmol/L (ref 20–29)
Calcium: 9.7 mg/dL (ref 8.6–10.2)
Chloride: 105 mmol/L (ref 96–106)
Creatinine, Ser: 0.74 mg/dL — ABNORMAL LOW (ref 0.76–1.27)
Globulin, Total: 1.9 g/dL (ref 1.5–4.5)
Glucose: 96 mg/dL (ref 70–99)
Potassium: 4.5 mmol/L (ref 3.5–5.2)
Sodium: 145 mmol/L — ABNORMAL HIGH (ref 134–144)
Total Protein: 5.9 g/dL — ABNORMAL LOW (ref 6.0–8.5)
eGFR: 94 mL/min/{1.73_m2} (ref >59)

## 2022-06-16 LAB — HEMOGLOBIN A1C
Est. average glucose Bld gHb Est-mCnc: 131 mg/dL
Hgb A1c MFr Bld: 6.2 % — ABNORMAL HIGH (ref 4.8–5.6)

## 2022-06-18 ENCOUNTER — Other Ambulatory Visit: Payer: Self-pay | Admitting: Cardiology

## 2022-06-18 DIAGNOSIS — I4819 Other persistent atrial fibrillation: Secondary | ICD-10-CM

## 2022-06-18 NOTE — Telephone Encounter (Signed)
Prescription refill request for Xarelto received.  Indication: PE Last office visit: 01/14/22 Molli Hazard)  Weight: 83.9kg Age: 77 Scr: 0.74 (06/14/22)  CrCl: 102.73ml/min  Appropriate dose. Refill sent.

## 2022-07-05 ENCOUNTER — Ambulatory Visit: Payer: PPO | Admitting: Internal Medicine

## 2022-07-05 ENCOUNTER — Encounter: Payer: Self-pay | Admitting: Internal Medicine

## 2022-07-05 VITALS — BP 142/60 | HR 100 | Temp 98.0°F | Resp 16 | Ht 69.0 in | Wt 190.2 lb

## 2022-07-05 DIAGNOSIS — Z7901 Long term (current) use of anticoagulants: Secondary | ICD-10-CM | POA: Diagnosis not present

## 2022-07-05 DIAGNOSIS — Z923 Personal history of irradiation: Secondary | ICD-10-CM | POA: Diagnosis not present

## 2022-07-05 DIAGNOSIS — Z8546 Personal history of malignant neoplasm of prostate: Secondary | ICD-10-CM | POA: Diagnosis not present

## 2022-07-05 DIAGNOSIS — I080 Rheumatic disorders of both mitral and aortic valves: Secondary | ICD-10-CM | POA: Diagnosis not present

## 2022-07-05 DIAGNOSIS — J439 Emphysema, unspecified: Secondary | ICD-10-CM | POA: Diagnosis not present

## 2022-07-05 DIAGNOSIS — R519 Headache, unspecified: Secondary | ICD-10-CM | POA: Diagnosis not present

## 2022-07-05 DIAGNOSIS — I480 Paroxysmal atrial fibrillation: Secondary | ICD-10-CM | POA: Diagnosis not present

## 2022-07-05 DIAGNOSIS — I7121 Aneurysm of the ascending aorta, without rupture: Secondary | ICD-10-CM | POA: Diagnosis not present

## 2022-07-05 DIAGNOSIS — R9431 Abnormal electrocardiogram [ECG] [EKG]: Secondary | ICD-10-CM | POA: Diagnosis not present

## 2022-07-05 DIAGNOSIS — I517 Cardiomegaly: Secondary | ICD-10-CM | POA: Diagnosis not present

## 2022-07-05 DIAGNOSIS — N400 Enlarged prostate without lower urinary tract symptoms: Secondary | ICD-10-CM | POA: Diagnosis not present

## 2022-07-05 DIAGNOSIS — I4891 Unspecified atrial fibrillation: Secondary | ICD-10-CM | POA: Insufficient documentation

## 2022-07-05 DIAGNOSIS — I351 Nonrheumatic aortic (valve) insufficiency: Secondary | ICD-10-CM | POA: Diagnosis not present

## 2022-07-05 DIAGNOSIS — Z8589 Personal history of malignant neoplasm of other organs and systems: Secondary | ICD-10-CM | POA: Diagnosis not present

## 2022-07-05 DIAGNOSIS — E039 Hypothyroidism, unspecified: Secondary | ICD-10-CM | POA: Diagnosis not present

## 2022-07-05 DIAGNOSIS — I10 Essential (primary) hypertension: Secondary | ICD-10-CM | POA: Diagnosis not present

## 2022-07-05 DIAGNOSIS — I5041 Acute combined systolic (congestive) and diastolic (congestive) heart failure: Secondary | ICD-10-CM | POA: Diagnosis not present

## 2022-07-05 DIAGNOSIS — E876 Hypokalemia: Secondary | ICD-10-CM | POA: Diagnosis not present

## 2022-07-05 DIAGNOSIS — Z79899 Other long term (current) drug therapy: Secondary | ICD-10-CM | POA: Diagnosis not present

## 2022-07-05 DIAGNOSIS — I110 Hypertensive heart disease with heart failure: Secondary | ICD-10-CM | POA: Diagnosis not present

## 2022-07-05 DIAGNOSIS — J811 Chronic pulmonary edema: Secondary | ICD-10-CM | POA: Diagnosis not present

## 2022-07-05 DIAGNOSIS — M1711 Unilateral primary osteoarthritis, right knee: Secondary | ICD-10-CM | POA: Diagnosis not present

## 2022-07-05 DIAGNOSIS — R531 Weakness: Secondary | ICD-10-CM | POA: Diagnosis not present

## 2022-07-05 DIAGNOSIS — I119 Hypertensive heart disease without heart failure: Secondary | ICD-10-CM | POA: Diagnosis not present

## 2022-07-05 DIAGNOSIS — I340 Nonrheumatic mitral (valve) insufficiency: Secondary | ICD-10-CM | POA: Diagnosis not present

## 2022-07-05 DIAGNOSIS — R0902 Hypoxemia: Secondary | ICD-10-CM | POA: Diagnosis not present

## 2022-07-05 DIAGNOSIS — R0989 Other specified symptoms and signs involving the circulatory and respiratory systems: Secondary | ICD-10-CM | POA: Diagnosis not present

## 2022-07-05 DIAGNOSIS — R5381 Other malaise: Secondary | ICD-10-CM | POA: Diagnosis not present

## 2022-07-05 DIAGNOSIS — E7800 Pure hypercholesterolemia, unspecified: Secondary | ICD-10-CM | POA: Diagnosis not present

## 2022-07-05 DIAGNOSIS — I509 Heart failure, unspecified: Secondary | ICD-10-CM | POA: Diagnosis not present

## 2022-07-05 DIAGNOSIS — Z881 Allergy status to other antibiotic agents status: Secondary | ICD-10-CM | POA: Diagnosis not present

## 2022-07-05 HISTORY — DX: Unspecified atrial fibrillation: I48.91

## 2022-07-05 NOTE — Progress Notes (Signed)
Office Visit  Subjective   Patient ID: GARRIK FRANZESE   DOB: 26-Jun-1945   Age: 77 y.o.   MRN: [id number]   Chief Complaint Chief Complaint  Patient presents with   Acute Visit    No energy and c/o SOB     History of Present Illness Mr. Litten is a 77 yo male comes in today for an acute visit with complaints of palpitations, SOB, generalized weakness and lower extremity edema that started about 2 weeks ago.  He denies any chest pain today.  He believes this is all due to low testosterone levels.   The patient has a known history of  Atria Fib.  He did see cardiology in followup on 01/2022 for preoperative clearance and was also seen on 08/16/2021 where he had a repeat ECHO done on 08/07/2021.  He now has a cardiomyopathy where his ECHO showed that he had a mild to moderate decrease his LV function with a LVEF of 40-45%.  He has LV hypokinesis and a LV internal cavity size that is mildly dilated.  He has moderate concentric LVH and his left diastolic parameters were indeterminate.  His LA is severely dilated and he has moderate MR and AV.  Dr. Tomie China asked him to stop his losartan and started him on  entresto 24/26mg  BID which he has done and denies any problems.  His BP has been stable at home.   He also asked him to stop his diltiazem since going on entresto as well.  They did do a repeat ECHO on 10/09/2021 and this showed an improvement in his EF where his EF went up to 50-55% and low normal systolic function.  There was no regional wall motion abnormalities. The left ventricular internal cavity size was mildly to moderately dilated. His Left ventricular diastolic parameters are indeterminate.  His right ventricular systolic function is normal. The right ventricular  size is normal. There is normal pulmonary artery systolic pressure. His left atrial size was severely dilated. He mild to moderate mitral valve regurgitation.  He has an aneurysm of the ascending aorta, measuring 51 mm. The patient did  followup with Cardiology on 11/09/2021 and recommended continued management and he was stable at that time. Again, the patient was admitted to [facility] from 06/03/2021 until 06/07/2021 with A. Fib with RVR.  He states that he woke up after a nap and began having severe generalized weakness where he could not use his legs.  The patient was brought in to the ER where his wife states his HR was in the 190's and they discovered he was in A. Fib with RVR.  He has a known history of A. Fib where he is followed regularly by myself and cardiology.  While in the hospital, they did place him on a Cardizem drip.  He did not have a repeat ECHO.  They did perform a CTA of his chest which showed no evidence of pulmonary embolism and he had a stable 4.9 AAA that was unchanged.  His heart rate was controlled and they increase his metoprolol from 25mg  BID to 50mg  BID and started him on Cardizem 60mg  TID which did control his heart rate.  Again, cardiology performed a stress ECHO in Oct 2016 which was normal.  They did a ECHO in 02/2017 which showed moderate LVH with a normal LVEF 50-55% without wall motion abnormalities.  He had a repeat ECHO in 09/04/2019 and this showed an LVEF 50-55% with his LV cavity size severely dilated  and his LA was severely dilated and there was mild-moderate MVR.  He had AV insuffiency that was moderate and a LV dilatation where they think he may be coming soon to the point of surgery.   Anticoagulation status: He remains on lifelong xarelto therapy per cardiology. CHADVASC2 score is 3.  He denies any bruising, melena, BRBPR or other bleeding problems.       Past Medical History Past Medical History:  Diagnosis Date   Aneurysm of aortic root (HCC)    Overview:  Last Assessment & Plan:  Planning to see Dr Tyrone Sage for evaluation of 5cm aneurysm.  - will check PFT in prep for possible procedure / SGY   Aortic atherosclerosis (HCC) 06/14/2022   Aortic regurgitation    Aortic valve insufficiency     Ascending aortic aneurysm (HCC) 09/11/2016   Atherosclerotic vascular disease 05/27/2022   Atrial fibrillation (HCC)    BMI 27.0-27.9,adult 06/14/2022   BPH (benign prostatic hyperplasia)    Cardiomyopathy (HCC) 09/11/2016   CKD (chronic kidney disease) stage 2, GFR 60-89 ml/min 11/23/2021   Coronary artery calcification seen on CT scan 10/13/2017   Essential hypertension 06/14/2022   History of cardiomyopathy 11/09/2021   History of pulmonary embolism 09/24/2012   Overview:  Last Assessment & Plan:  Hx PE x 2 per notes, ? 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  - needs auto-titration study by [employer] or local company, data to [facility] to adjust his home device.    Preoperative clearance 10/13/2017   Primary osteoarthritis involving multiple joints 06/14/2022   Prostate cancer metastatic to bone (HCC) 06/14/2022   Pulmonary embolism (HCC)    Rhinitis    Seasonal allergies 06/14/2022   Spondylolisthesis of lumbar region 12/12/2021   Stage 3a chronic kidney disease (HCC) 06/14/2022     Allergies Allergies  Allergen Reactions   Ciprofloxacin     Contraindicated because of increased risk of rupture of ascending thoracic aortic aneurysm     Medications  Current Outpatient Medications:    acetaminophen (TYLENOL) 500 MG tablet, Take 500 mg by mouth every 6 (six) hours as needed for mild pain or moderate pain., Disp: ,  Rfl:    dapagliflozin propanediol (FARXIGA) 5 MG TABS tablet, Take 1 tablet (5 mg total) by mouth daily., Disp: 90 tablet, Rfl: 1   folic acid (FOLVITE) 800 MCG tablet, Take 400 mcg by mouth daily., Disp: , Rfl:    gabapentin (NEURONTIN) 300 MG capsule, Take 300 mg by mouth at bedtime. Patient states he is taking two pills once daily, Disp: , Rfl:    metoprolol tartrate (LOPRESSOR) 50 MG tablet, Take 1 tablet (50 mg total) by mouth 2 (two) times daily., Disp: 180 tablet, Rfl: 3   Multiple Vitamin (MULTIVITAMINS PO), Take 1 tablet by mouth daily. Unknown strength, Disp: , Rfl:    Omega-3 1000 MG CAPS, Take 1,200 mg by mouth daily., Disp: , Rfl:    Potassium 99  MG TABS, Take 1 tablet by mouth daily., Disp: , Rfl:    rivaroxaban (XARELTO) 20 MG TABS tablet, TAKE 1 TABLET BY MOUTH ONCE DAILY WITH SUPPER, Disp: 90 tablet, Rfl: 1   rosuvastatin (CRESTOR) 5 MG tablet, Take 1 tablet by mouth once daily, Disp: 90 tablet, Rfl: 2   sacubitril-valsartan (ENTRESTO) 24-26 MG, Take 1 tablet by mouth 2 (two) times daily., Disp: 60 tablet, Rfl: 12   tamsulosin (FLOMAX) 0.4 MG CAPS capsule, Take 0.4 mg by mouth daily after supper., Disp: , Rfl:    TART CHERRY PO, Take 1 tablet by mouth daily. Unknown strenght, Disp: , Rfl:    UNABLE TO FIND, Take 2 tablets by mouth daily. Med Name: Curamin/ Unknown strength, Disp: , Rfl:    zinc gluconate 50 MG tablet, Take 50 mg by mouth daily., Disp: , Rfl:    Review of Systems Review of Systems  Constitutional:  Positive for malaise/fatigue. Negative for chills and fever.  Eyes:  Negative for blurred vision and double vision.  Respiratory:  Positive for shortness of breath. Negative for cough.   Cardiovascular:  Positive for palpitations and leg swelling. Negative for chest pain.  Gastrointestinal:  Negative for abdominal pain, constipation, diarrhea, nausea and vomiting.  Musculoskeletal:  Negative for myalgias.  Neurological:  Positive for weakness. Negative for dizziness  and headaches.       Objective:    Vitals BP (!) 142/60   Pulse 100   Temp 98 F (36.7 C)   Resp 16   Ht 5\' 9"  (1.753 m)   Wt 190 lb 3.2 oz (86.3 kg)   SpO2 93%   BMI 28.09 kg/m    Physical Examination Physical Exam Constitutional:      Appearance: Normal appearance. He is not ill-appearing.  Cardiovascular:     Rate and Rhythm: Tachycardia present. Rhythm irregular.     Heart sounds: No murmur heard.    No friction rub. No gallop.  Pulmonary:     Effort: Pulmonary effort is normal. No respiratory distress.     Breath sounds: No wheezing, rhonchi or rales.  Abdominal:     General: Abdomen is flat. Bowel sounds are normal. There is no distension.     Palpations: Abdomen is soft.     Tenderness: There is no abdominal tenderness.  Musculoskeletal:     Right lower leg: No edema.     Left lower leg: No edema.  Skin:    General: Skin is warm and dry.     Findings: No rash.  Neurological:     Mental Status: He is alert.        Assessment & Plan:   Atrial fibrillation with RVR (HCC) He is having symptomatic A. Fib. Where his EKG at clinic showed A. Fib with RVR with a HR 130.   He was diagnosed last year where he was admitted for several days where it was difficult to control his HR.  They stopped him off diltiazem when he was placed on entresto.  I spoke to cardiology and they felt it was best for him to be sent to the ER for workup.    No follow-ups on file.   Crist Fat, MD

## 2022-07-05 NOTE — Assessment & Plan Note (Signed)
He is having symptomatic A. Fib. Where his EKG at clinic showed A. Fib with RVR with a HR 130.   He was diagnosed last year where he was admitted for several days where it was difficult to control his HR.  They stopped him off diltiazem when he was placed on entresto.  I spoke to cardiology and they felt it was best for him to be sent to the ER for workup.

## 2022-07-06 DIAGNOSIS — I351 Nonrheumatic aortic (valve) insufficiency: Secondary | ICD-10-CM | POA: Diagnosis not present

## 2022-07-06 DIAGNOSIS — I7121 Aneurysm of the ascending aorta, without rupture: Secondary | ICD-10-CM

## 2022-07-06 DIAGNOSIS — I340 Nonrheumatic mitral (valve) insufficiency: Secondary | ICD-10-CM | POA: Diagnosis not present

## 2022-07-06 DIAGNOSIS — I509 Heart failure, unspecified: Secondary | ICD-10-CM | POA: Diagnosis not present

## 2022-07-06 DIAGNOSIS — I119 Hypertensive heart disease without heart failure: Secondary | ICD-10-CM | POA: Diagnosis not present

## 2022-07-06 DIAGNOSIS — Z7901 Long term (current) use of anticoagulants: Secondary | ICD-10-CM

## 2022-07-06 DIAGNOSIS — I4891 Unspecified atrial fibrillation: Secondary | ICD-10-CM | POA: Diagnosis not present

## 2022-07-07 DIAGNOSIS — I4891 Unspecified atrial fibrillation: Secondary | ICD-10-CM | POA: Diagnosis not present

## 2022-07-07 DIAGNOSIS — I509 Heart failure, unspecified: Secondary | ICD-10-CM | POA: Diagnosis not present

## 2022-07-07 DIAGNOSIS — I119 Hypertensive heart disease without heart failure: Secondary | ICD-10-CM | POA: Diagnosis not present

## 2022-07-07 DIAGNOSIS — I351 Nonrheumatic aortic (valve) insufficiency: Secondary | ICD-10-CM | POA: Diagnosis not present

## 2022-07-08 DIAGNOSIS — I4891 Unspecified atrial fibrillation: Secondary | ICD-10-CM | POA: Diagnosis not present

## 2022-07-08 DIAGNOSIS — I351 Nonrheumatic aortic (valve) insufficiency: Secondary | ICD-10-CM | POA: Diagnosis not present

## 2022-07-08 DIAGNOSIS — I509 Heart failure, unspecified: Secondary | ICD-10-CM | POA: Diagnosis not present

## 2022-07-08 DIAGNOSIS — I119 Hypertensive heart disease without heart failure: Secondary | ICD-10-CM | POA: Diagnosis not present

## 2022-07-09 DIAGNOSIS — I119 Hypertensive heart disease without heart failure: Secondary | ICD-10-CM | POA: Diagnosis not present

## 2022-07-09 DIAGNOSIS — I351 Nonrheumatic aortic (valve) insufficiency: Secondary | ICD-10-CM | POA: Diagnosis not present

## 2022-07-09 DIAGNOSIS — I509 Heart failure, unspecified: Secondary | ICD-10-CM | POA: Diagnosis not present

## 2022-07-09 DIAGNOSIS — I4891 Unspecified atrial fibrillation: Secondary | ICD-10-CM | POA: Diagnosis not present

## 2022-07-12 DIAGNOSIS — M4316 Spondylolisthesis, lumbar region: Secondary | ICD-10-CM | POA: Diagnosis not present

## 2022-07-12 DIAGNOSIS — J439 Emphysema, unspecified: Secondary | ICD-10-CM | POA: Diagnosis not present

## 2022-07-12 DIAGNOSIS — I4891 Unspecified atrial fibrillation: Secondary | ICD-10-CM | POA: Diagnosis not present

## 2022-07-12 DIAGNOSIS — I5041 Acute combined systolic (congestive) and diastolic (congestive) heart failure: Secondary | ICD-10-CM | POA: Diagnosis not present

## 2022-07-12 DIAGNOSIS — Z6824 Body mass index (BMI) 24.0-24.9, adult: Secondary | ICD-10-CM | POA: Diagnosis not present

## 2022-07-12 DIAGNOSIS — I130 Hypertensive heart and chronic kidney disease with heart failure and stage 1 through stage 4 chronic kidney disease, or unspecified chronic kidney disease: Secondary | ICD-10-CM | POA: Diagnosis not present

## 2022-07-12 DIAGNOSIS — I429 Cardiomyopathy, unspecified: Secondary | ICD-10-CM | POA: Diagnosis not present

## 2022-07-12 DIAGNOSIS — C7989 Secondary malignant neoplasm of other specified sites: Secondary | ICD-10-CM | POA: Diagnosis not present

## 2022-07-12 DIAGNOSIS — E7800 Pure hypercholesterolemia, unspecified: Secondary | ICD-10-CM | POA: Diagnosis not present

## 2022-07-12 DIAGNOSIS — G4733 Obstructive sleep apnea (adult) (pediatric): Secondary | ICD-10-CM | POA: Diagnosis not present

## 2022-07-12 DIAGNOSIS — M5416 Radiculopathy, lumbar region: Secondary | ICD-10-CM | POA: Diagnosis not present

## 2022-07-12 DIAGNOSIS — E876 Hypokalemia: Secondary | ICD-10-CM | POA: Diagnosis not present

## 2022-07-12 DIAGNOSIS — Z86718 Personal history of other venous thrombosis and embolism: Secondary | ICD-10-CM | POA: Diagnosis not present

## 2022-07-12 DIAGNOSIS — C7951 Secondary malignant neoplasm of bone: Secondary | ICD-10-CM | POA: Diagnosis not present

## 2022-07-12 DIAGNOSIS — N1831 Chronic kidney disease, stage 3a: Secondary | ICD-10-CM | POA: Diagnosis not present

## 2022-07-12 DIAGNOSIS — I700 Atherosclerosis of aorta: Secondary | ICD-10-CM | POA: Diagnosis not present

## 2022-07-12 DIAGNOSIS — E669 Obesity, unspecified: Secondary | ICD-10-CM | POA: Diagnosis not present

## 2022-07-12 DIAGNOSIS — J302 Other seasonal allergic rhinitis: Secondary | ICD-10-CM | POA: Diagnosis not present

## 2022-07-12 DIAGNOSIS — C61 Malignant neoplasm of prostate: Secondary | ICD-10-CM | POA: Diagnosis not present

## 2022-07-12 DIAGNOSIS — N400 Enlarged prostate without lower urinary tract symptoms: Secondary | ICD-10-CM | POA: Diagnosis not present

## 2022-07-12 DIAGNOSIS — I7121 Aneurysm of the ascending aorta, without rupture: Secondary | ICD-10-CM | POA: Diagnosis not present

## 2022-07-12 DIAGNOSIS — I2510 Atherosclerotic heart disease of native coronary artery without angina pectoris: Secondary | ICD-10-CM | POA: Diagnosis not present

## 2022-07-12 DIAGNOSIS — M1711 Unilateral primary osteoarthritis, right knee: Secondary | ICD-10-CM | POA: Diagnosis not present

## 2022-07-12 DIAGNOSIS — I083 Combined rheumatic disorders of mitral, aortic and tricuspid valves: Secondary | ICD-10-CM | POA: Diagnosis not present

## 2022-07-16 ENCOUNTER — Telehealth: Payer: Self-pay | Admitting: Cardiology

## 2022-07-16 NOTE — Telephone Encounter (Signed)
New message:       Patient would like to switch from Dr Tomie China to Dr Dulce Sellar. Is this alright with you both?

## 2022-07-18 DIAGNOSIS — M5416 Radiculopathy, lumbar region: Secondary | ICD-10-CM | POA: Diagnosis not present

## 2022-07-18 DIAGNOSIS — M533 Sacrococcygeal disorders, not elsewhere classified: Secondary | ICD-10-CM | POA: Diagnosis not present

## 2022-07-18 DIAGNOSIS — I509 Heart failure, unspecified: Secondary | ICD-10-CM | POA: Diagnosis not present

## 2022-07-19 ENCOUNTER — Ambulatory Visit: Payer: PPO | Admitting: Cardiology

## 2022-07-22 ENCOUNTER — Encounter: Payer: Self-pay | Admitting: Internal Medicine

## 2022-07-31 ENCOUNTER — Ambulatory Visit: Payer: PPO | Admitting: Internal Medicine

## 2022-07-31 ENCOUNTER — Encounter: Payer: Self-pay | Admitting: Internal Medicine

## 2022-07-31 VITALS — BP 102/50 | HR 75 | Temp 97.9°F | Resp 16 | Ht 69.0 in | Wt 179.4 lb

## 2022-07-31 DIAGNOSIS — Z79899 Other long term (current) drug therapy: Secondary | ICD-10-CM | POA: Insufficient documentation

## 2022-07-31 DIAGNOSIS — I4891 Unspecified atrial fibrillation: Secondary | ICD-10-CM | POA: Diagnosis not present

## 2022-07-31 HISTORY — DX: Other long term (current) drug therapy: Z79.899

## 2022-07-31 MED ORDER — DIGOXIN 125 MCG PO TABS: 125.0000 ug | ORAL_TABLET | Freq: Every day | ORAL | 3 refills | Status: DC

## 2022-07-31 MED ORDER — KLOR-CON M20 20 MEQ PO TBCR: 20.0000 meq | EXTENDED_RELEASE_TABLET | Freq: Every day | ORAL | 3 refills | Status: DC

## 2022-07-31 MED ORDER — TORSEMIDE 20 MG PO TABS: 20.0000 mg | ORAL_TABLET | Freq: Two times a day (BID) | ORAL | 3 refills | Status: DC

## 2022-07-31 MED ORDER — DAPAGLIFLOZIN PROPANEDIOL 5 MG PO TABS: 5.0000 mg | ORAL_TABLET | Freq: Every day | ORAL | Status: DC

## 2022-07-31 MED ORDER — METOPROLOL TARTRATE 50 MG PO TABS: 75.0000 mg | ORAL_TABLET | Freq: Two times a day (BID) | ORAL | Status: DC

## 2022-07-31 NOTE — Assessment & Plan Note (Signed)
He is actually doing well and his HR is running in the 70's.  He has no evidence of volume overload.  I am going to check his digoxin levels and K+/Mg levels since he is on K+ supplementation.

## 2022-07-31 NOTE — Progress Notes (Signed)
Office Visit  Subjective   Patient ID: Aaron Richmond   DOB: 12/28/1945   Age: 77 y.o.   MRN: [id number]   Chief Complaint Chief Complaint  Patient presents with   Follow-up    Hospital     History of Present Illness Aaron Richmond is a 77 yo male who returns today for followup where he was admitted to [facility] from 07/05/2022 until 07/09/2022 for A Fib with RVR and CHF exacerbation.  I saw him in the office on 07/05/2022 where he was having symptomatic A. Fib with RVR with a HR of 130's in the office.  He was diagnosed last year with A. Fib where he was admitted for several days where it was difficult to control his HR.  They stopped him off diltiazem when he was placed on entresto.  I spoke to cardiology and they felt it was best for him to be sent to the ER for workup where he was seen in the ER on 07/05/2022.  They did lab workup and his initial CXR showed some vascular congestion.  They placed him on a cardizem drip where he had improvement with his HR.  They uptitrated his metoprolol.  The patient was seen by cardiology and they started him on digoxin.  They diuresed him with iv lasix where his dyspnea and hypoxia improved.    The patient has a known history of  A. Fib.  He did see cardiology in followup on 01/2022 for preoperative clearance and was also seen on 08/16/2021 where he had a repeat ECHO done on 08/07/2021.  He now has a cardiomyopathy where his ECHO showed that he had a mild to moderate decrease his LV function with a LVEF of 40-45%.  He has LV hypokinesis and a LV internal cavity size that is mildly dilated.  He has moderate concentric LVH and his left diastolic parameters were indeterminate.  His LA is severely dilated and he has moderate MR and AV.  Dr. Tomie China asked him to stop his losartan and started him on  entresto 24/26mg  BID this past year.  His BP has been stable at home.   He also asked him to stop his diltiazem since going on entresto as well.  They did do a repeat ECHO on 10/09/2021  and this showed an improvement in his EF where his EF went up to 50-55% and low normal systolic function.  There was no regional wall motion abnormalities. The left ventricular internal cavity size was mildly to moderately dilated. His Left ventricular diastolic parameters are indeterminate.  His right ventricular systolic function is normal. The right ventricular  size is normal. There is normal pulmonary artery systolic pressure. His left atrial size was severely dilated. He mild to moderate mitral valve regurgitation.  He has an aneurysm of the ascending aorta, measuring 51 mm. The patient did followup with Cardiology on 11/09/2021 and recommended continued management and he was stable at that time. Again, the patient was admitted to [facility] from 06/03/2021 until 06/07/2021 with A. Fib with RVR.  He states that he woke up after a nap and began having severe generalized weakness where he could not use his legs.  The patient was brought in to the ER where his wife states his HR was in the 190's and they discovered he was in A. Fib with RVR.  He has a known history of A. Fib where he is followed regularly by myself and cardiology.  While in the hospital, they did place  him on a Cardizem drip.  He did not have a repeat ECHO.  They did perform a CTA of his chest which showed no evidence of pulmonary embolism and he had a stable 4.9 AAA that was unchanged.  His heart rate was controlled and they increase his metoprolol from 25mg  BID to 50mg  BID and started him on Cardizem 60mg  TID which did control his heart rate.  Again, cardiology performed a stress ECHO in Oct 2016 which was normal.  They did a ECHO in 02/2017 which showed moderate LVH with a normal LVEF 50-55% without wall motion abnormalities.  He had a repeat ECHO in 09/04/2019 and this showed an LVEF 50-55% with his LV cavity size severely dilated and his LA was severely dilated and there was mild-moderate MVR.  He had AV insuffiency that was moderate and a  LV dilatation where they think he may be coming soon to the point of surgery.   Anticoagulation status: He remains on lifelong xarelto therapy per cardiology. CHADVASC2 score is 3.  He denies any bruising, melena, BRBPR or other bleeding problems.     Past Medical History Past Medical History:  Diagnosis Date   Aneurysm of aortic root (HCC)    Overview:  Last Assessment & Plan:  Planning to see Dr Tyrone Sage for evaluation of 5cm aneurysm.  - will check PFT in prep for possible procedure / SGY   Aortic atherosclerosis (HCC) 06/14/2022   Aortic regurgitation    Aortic valve insufficiency    Ascending aortic aneurysm (HCC) 09/11/2016   Atherosclerotic vascular disease 05/27/2022   Atrial fibrillation (HCC)    BMI 27.0-27.9,adult 06/14/2022   BPH (benign prostatic hyperplasia)    Cardiomyopathy (HCC) 09/11/2016   CKD (chronic kidney disease) stage 2, GFR 60-89 ml/min 11/23/2021   Coronary artery calcification seen on CT scan 10/13/2017   Essential hypertension 06/14/2022   History of cardiomyopathy 11/09/2021   History of pulmonary embolism 09/24/2012   Overview:  Last Assessment & Plan:  Hx PE x 2 per notes, ? 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  - needs auto-titration study by [employer] or local company, data to RB to adjust his home device.    Preoperative clearance 10/13/2017   Primary osteoarthritis involving  multiple joints 06/14/2022   Prostate cancer metastatic to bone (HCC) 06/14/2022   Pulmonary embolism (HCC)    Rhinitis    Seasonal allergies 06/14/2022   Spondylolisthesis of lumbar region 12/12/2021   Stage 3a chronic kidney disease (HCC) 06/14/2022     Allergies Allergies  Allergen Reactions   Ciprofloxacin     Contraindicated because of increased risk of rupture of ascending thoracic aortic aneurysm     Medications  Current Outpatient Medications:    digoxin (LANOXIN) 0.125 MG tablet, Take 125 mcg by mouth daily., Disp: , Rfl:    KLOR-CON M20 20 MEQ tablet, Take 20 mEq by mouth daily., Disp: , Rfl:    torsemide (DEMADEX)  20 MG tablet, Take 20 mg by mouth 2 (two) times daily., Disp: , Rfl:    acetaminophen (TYLENOL) 500 MG tablet, Take 500 mg by mouth every 6 (six) hours as needed for mild pain or moderate pain., Disp: , Rfl:    dapagliflozin propanediol (FARXIGA) 5 MG TABS tablet, Take 1 tablet (5 mg total) by mouth daily., Disp: 90 tablet, Rfl: 1   gabapentin (NEURONTIN) 300 MG capsule, Take 300 mg by mouth at bedtime. Patient states he is taking two pills once daily, Disp: , Rfl:    Multiple Vitamin (MULTIVITAMINS PO), Take 1 tablet by mouth daily. Unknown strength, Disp: , Rfl:    Omega-3 1000 MG CAPS, Take 1,200 mg by mouth daily., Disp: , Rfl:    rivaroxaban (XARELTO) 20 MG TABS tablet, TAKE 1 TABLET BY MOUTH ONCE DAILY WITH SUPPER, Disp: 90 tablet, Rfl: 1   rosuvastatin (CRESTOR) 5 MG tablet, Take 1 tablet by mouth once daily, Disp: 90 tablet, Rfl: 2   sacubitril-valsartan (ENTRESTO) 24-26 MG, Take 1 tablet by mouth 2 (two) times daily., Disp: 60 tablet, Rfl: 12   tamsulosin (FLOMAX) 0.4 MG CAPS capsule, Take 0.4 mg by mouth daily after supper., Disp: , Rfl:    TART CHERRY PO, Take 1 tablet by mouth daily. Unknown strenght, Disp: , Rfl:    UNABLE TO FIND, Take 2 tablets by mouth daily. Med Name: Curamin/ Unknown strength, Disp: , Rfl:    zinc gluconate 50 MG tablet, Take 50  mg by mouth daily., Disp: , Rfl:    Review of Systems Review of Systems  Constitutional:  Negative for chills and fever.  Eyes:  Negative for blurred vision and double vision.  Respiratory:  Negative for shortness of breath.   Cardiovascular:  Negative for chest pain, palpitations and leg swelling.  Gastrointestinal:  Negative for abdominal pain, constipation, diarrhea, nausea and vomiting.  Neurological:  Negative for dizziness, focal weakness, weakness and headaches.       Objective:    Vitals BP (!) 102/50   Pulse 75   Temp 97.9 F (36.6 C)   Resp 16   Ht 5\' 9"  (1.753 m)   Wt 179 lb 6.4 oz (81.4 kg)   SpO2 94%   BMI 26.49 kg/m    Physical Examination Physical Exam Constitutional:      Appearance: Normal appearance. He is not ill-appearing.  Cardiovascular:     Rate and Rhythm: Normal rate and regular rhythm.     Pulses: Normal pulses.     Heart sounds: No murmur heard.    No friction rub. No gallop.  Pulmonary:     Effort: Pulmonary effort is normal. No respiratory distress.     Breath sounds: No wheezing, rhonchi or rales.  Abdominal:     General: Abdomen is flat. Bowel sounds are normal. There is no distension.     Palpations: Abdomen is soft.     Tenderness: There is no abdominal tenderness.  Musculoskeletal:     Right lower leg: No edema.     Left lower leg: No edema.  Skin:    General: Skin is warm and dry.     Findings: No rash.  Neurological:     Mental Status: He is alert.        Assessment & Plan:   Atrial fibrillation He is actually doing well and his HR is running in the 70's.  He has no evidence of volume overload.  I am going to check his digoxin levels and  K+/Mg levels since he is on K+ supplementation.    No follow-ups on file.   Crist Fat, MD

## 2022-08-01 ENCOUNTER — Encounter: Payer: Self-pay | Admitting: Internal Medicine

## 2022-08-01 ENCOUNTER — Ambulatory Visit: Payer: PPO | Admitting: Primary Care

## 2022-08-01 ENCOUNTER — Encounter: Payer: Self-pay | Admitting: Primary Care

## 2022-08-01 ENCOUNTER — Other Ambulatory Visit: Payer: Self-pay

## 2022-08-01 VITALS — BP 122/62 | HR 62 | Temp 98.0°F | Ht 70.0 in | Wt 179.2 lb

## 2022-08-01 DIAGNOSIS — G4730 Sleep apnea, unspecified: Secondary | ICD-10-CM

## 2022-08-01 DIAGNOSIS — G4733 Obstructive sleep apnea (adult) (pediatric): Secondary | ICD-10-CM

## 2022-08-01 LAB — BASIC METABOLIC PANEL
BUN/Creatinine Ratio: 24 (ref 10–24)
BUN: 28 mg/dL — ABNORMAL HIGH (ref 8–27)
CO2: 27 mmol/L (ref 20–29)
Calcium: 9.6 mg/dL (ref 8.6–10.2)
Chloride: 101 mmol/L (ref 96–106)
Creatinine, Ser: 1.18 mg/dL (ref 0.76–1.27)
Potassium: 4.8 mmol/L (ref 3.5–5.2)
Sodium: 145 mmol/L — ABNORMAL HIGH (ref 134–144)
eGFR: 64 mL/min/{1.73_m2} (ref >59)

## 2022-08-01 LAB — DIGOXIN LEVEL: Digoxin, Serum: 0.7 ng/mL (ref 0.5–0.9)

## 2022-08-01 MED ORDER — METOPROLOL TARTRATE 50 MG PO TABS: 75.0000 mg | ORAL_TABLET | Freq: Two times a day (BID) | ORAL | 1 refills | Status: DC

## 2022-08-01 NOTE — Progress Notes (Signed)
Reviewed and agree with assessment/plan.   Coralyn Helling, MD [facility] 08/01/2022, 1:27 PM Pager:  [phone]

## 2022-08-01 NOTE — Progress Notes (Signed)
@Patient  ID: Aaron Richmond, male    DOB: 06-22-1945, 77 y.o.   MRN: [id number]  Chief Complaint  Patient presents with   Consult    Sleep Consult    Referring provider: Crist Fat, MD  HPI: 77 year old male, never smoked. PMG significant for hypertension, pulmonary embolism, cardiomyopathy, A-fib, mitral valve insufficiency, coronary artery disease, obstructive sleep apnea, prostate cancer, chronic kidney disease.   08/01/2022 Patient presents today for sleep consult. Accompanied by his wife. History of sleep apnea. His last sleep study was done approx 12 years at [facility], results are not available for review. He has been wearing CPAP nightly without issues. No data on SD card. Previous CPAP pressure was 14cm h20 in 2015. Current machine is > 38 years old. Face mask is leaking. Typical bedtime is between 9 and 10:30 PM.  It takes him on average 10 to minutes to fall asleep.  He wakes up 4-6 times a night.  He starts his day at 7:30 AM.  He does not operate heavy machinery.  Weight has remained stable.  He is currently on CPAP, unsure pressure settings. Former DME was [employer] patient. He is not on oxygen. [place] [facility].    Allergies  Allergen Reactions   Ciprofloxacin     Contraindicated because of increased risk of rupture of ascending thoracic aortic aneurysm    Immunization History  Administered Date(s) Administered   Influenza Inj Mdck Quad Pf 11/23/2021   Influenza-Unspecified 11/07/2017    Past Medical History:  Diagnosis Date   Aneurysm of aortic root (HCC)    Overview:  Last Assessment & Plan:  Planning to see Dr Tyrone Sage for evaluation of 5cm aneurysm.  - will check PFT in prep for possible procedure / SGY   Aortic atherosclerosis (HCC) 06/14/2022   Aortic regurgitation    Aortic valve insufficiency    Ascending aortic aneurysm (HCC) 09/11/2016   Atherosclerotic vascular disease 05/27/2022   Atrial fibrillation (HCC)    BMI 27.0-27.9,adult 06/14/2022    BPH (benign prostatic hyperplasia)    Cardiomyopathy (HCC) 09/11/2016   CKD (chronic kidney disease) stage 2, GFR 60-89 ml/min 11/23/2021   Coronary artery calcification seen on CT scan 10/13/2017   Essential hypertension 06/14/2022   History of cardiomyopathy 11/09/2021   History of pulmonary embolism 09/24/2012   Overview:  Last Assessment & Plan:  Hx PE x 2 per notes, ? 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  - needs auto-titration study by [employer] or local company, data to RB to adjust his home device.    Preoperative clearance 10/13/2017   Primary osteoarthritis involving multiple joints 06/14/2022   Prostate cancer metastatic to bone (HCC) 06/14/2022   Pulmonary embolism (HCC)    Rhinitis    Seasonal allergies 06/14/2022   Spondylolisthesis of lumbar region 12/12/2021   Stage 3a chronic kidney disease (HCC) 06/14/2022    Tobacco History: Social History   Tobacco Use  Smoking Status Never  Smokeless Tobacco Never   Counseling given: Not Answered   Outpatient Medications Prior to Visit  Medication Sig Dispense Refill   acetaminophen (TYLENOL) 500 MG tablet Take 500 mg by mouth every 6 (six) hours as needed for mild pain or moderate pain.     dapagliflozin propanediol (FARXIGA) 5 MG TABS tablet Take 1 tablet (5 mg total) by mouth daily.     digoxin (LANOXIN) 0.125 MG tablet Take 1 tablet (125 mcg total) by  mouth daily. 90 tablet 3   gabapentin (NEURONTIN) 300 MG capsule Take 300 mg by mouth at bedtime. Patient states he is taking two pills once daily     KLOR-CON M20 20 MEQ tablet Take 1 tablet (20 mEq total) by mouth daily. 90 tablet 3   metoprolol tartrate (LOPRESSOR) 50 MG tablet Take 1.5 tablets (75 mg total) by mouth 2 (two) times daily. 180 tablet 1   Multiple Vitamin (MULTIVITAMINS PO) Take 1 tablet by mouth daily. Unknown strength     Omega-3 1000 MG CAPS Take 1,200 mg by mouth daily.     rivaroxaban (XARELTO) 20 MG TABS tablet TAKE 1 TABLET BY MOUTH ONCE DAILY WITH SUPPER 90 tablet 1   rosuvastatin (CRESTOR) 5 MG tablet Take 1 tablet by mouth once daily 90 tablet 2   sacubitril-valsartan (ENTRESTO) 24-26 MG Take 1 tablet by mouth 2 (two) times daily. 60 tablet 12   tamsulosin (FLOMAX) 0.4 MG CAPS capsule Take 0.4 mg by mouth daily after supper.     TART CHERRY PO Take 1 tablet by mouth daily. Unknown strenght     torsemide (DEMADEX) 20 MG tablet Take 1 tablet (20 mg total) by mouth 2 (two) times daily. 180 tablet 3   UNABLE TO FIND Take 2 tablets by mouth daily. Med Name: Curamin/ Unknown strength     dapagliflozin propanediol (FARXIGA) 5 MG TABS tablet Take 1 tablet (5 mg total) by mouth daily. 90 tablet 1   zinc gluconate 50 MG tablet Take 50 mg by mouth daily. (Patient not taking: Reported on 08/01/2022)     No facility-administered medications prior to visit.      Review of Systems  Review of Systems  Constitutional:  Positive for fatigue.  Respiratory: Negative.  Negative for apnea and shortness of breath.   Cardiovascular: Negative.   Psychiatric/Behavioral:  Positive for sleep disturbance.      Physical Exam  BP 122/62 (BP Location: Right Arm, Cuff Size: Normal)   Pulse 62   Temp 98 F (36.7 C) (Temporal)   Ht 5\' 10"  (1.778 m)   Wt 179 lb 3.2 oz (81.3 kg)   SpO2 97%   BMI 25.71 kg/m  Physical Exam Constitutional:      Appearance: Normal appearance.  HENT:      Head: Normocephalic and atraumatic.     Mouth/Throat:     Pharynx: Oropharynx is clear.  Cardiovascular:     Rate and Rhythm: Normal rate and regular rhythm.  Pulmonary:     Effort: Pulmonary effort is normal.     Breath sounds: Normal breath sounds.  Musculoskeletal:        General: Normal range of motion.  Skin:    General: Skin is warm and dry.  Neurological:     General: No focal deficit present.     Mental Status: He is alert and oriented to person, place, and time. Mental status is at baseline.  Psychiatric:        Mood and Affect:  Mood normal.        Behavior: Behavior normal.        Thought Content: Thought content normal.        Judgment: Judgment normal.      Lab Results:  CBC    Component Value Date/Time   WBC 7.5 06/14/2022 1155   WBC 10.1 12/23/2013 0940   RBC 3.83 (L) 06/14/2022 1155   RBC 4.71 12/23/2013 0940   HGB 12.3 (L) 06/14/2022 1155   HCT 37.3 (L) 06/14/2022 1155   PLT 202 06/14/2022 1155   MCV 97 06/14/2022 1155   MCH 32.1 06/14/2022 1155   MCHC 33.0 06/14/2022 1155   MCHC 32.4 12/23/2013 0940   RDW 14.3 06/14/2022 1155   LYMPHSABS 1.1 06/14/2022 1155   MONOABS 0.9 10/20/2012 1029   EOSABS 0.0 06/14/2022 1155   BASOSABS 0.0 06/14/2022 1155    BMET    Component Value Date/Time   NA 145 (H) 07/31/2022 1633   K 4.8 07/31/2022 1633   CL 101 07/31/2022 1633   CO2 27 07/31/2022 1633   GLUCOSE 100 (H) 07/31/2022 1633   GLUCOSE 99 12/23/2013 0940   BUN 28 (H) 07/31/2022 1633   CREATININE 1.18 07/31/2022 1633   CREATININE 1.20 01/04/2014 1006   CALCIUM 9.6 07/31/2022 1633   GFRNONAA 69 11/13/2018 0924   GFRAA 80 11/13/2018 0924    BNP No results found for: "BNP"  ProBNP No results found for: "PROBNP"  Imaging: No results found.   Assessment & Plan:   Obstructive sleep apnea syndrome - Hx sleep apnea. He has been wearing CPAP nightly without issue. Current machine is > 10 years. Previous pressure was 14cm h20 in 2015. We do not  have copy of his sleep study and there is no data on SD card. We will need to get updated sleep study, ordering split night sleep study due to cardiac history.  Reviewed risks of untreated sleep apnea including cardiac events, pulmonary tension, diabetes and stroke.  We also discussed treatment options.  Once patient has completed sleep study we will be able to place an order for him to receive new machine and CPAP supplies. Advised patient continue to wear CPAP nightly 4-6 hours or longer. Focus on side sleeping position and work on weight loss efforts.      Glenford Bayley, NP 08/01/2022

## 2022-08-01 NOTE — Patient Instructions (Addendum)
   Sleep apnea is defined as period of 10 seconds or longer when you stop breathing at night. This can happen multiple times a night. Dx sleep apnea is when this occurs more than 5 times an hour.    Mild OSA 5-15 apneic events an hour Moderate OSA 15-30 apneic events an hour Severe OSA > 30 apneic events an hour   Untreated sleep apnea puts you at higher risk for cardiac arrhythmias, pulmonary HTN, stroke and diabetes  Treatment options include weight loss, side sleeping position, oral appliance, CPAP therapy or referral to ENT for possible surgical options    Recommendations: Continue to wear CPAP nightly 4-6 hours  Focus on side sleeping position or elevate head with wedge pillow 30 degrees Work on weight loss efforts if able  Do not drive if experiencing excessive daytime sleepiness of fatigue    Orders: In-lab split night sleep study re: hx sleep apnea (ordered)    Follow-up: Please call to schedule follow-up 1-2 weeks after completing home sleep study to review results and treatment if needed (can be virtual)

## 2022-08-01 NOTE — Assessment & Plan Note (Addendum)
-   Hx sleep apnea. He has been wearing CPAP nightly without issue. Current machine is > 10 years. Previous pressure was 14cm h20 in 2015. We do not have copy of his sleep study and there is no data on SD card. We will need to get updated sleep study, ordering split night sleep study due to cardiac history.  Reviewed risks of untreated sleep apnea including cardiac events, pulmonary tension, diabetes and stroke.  We also discussed treatment options.  Once patient has completed sleep study we will be able to place an order for him to receive new machine and CPAP supplies. Advised patient continue to wear CPAP nightly 4-6 hours or longer. Focus on side sleeping position and work on weight loss efforts.

## 2022-08-06 NOTE — Progress Notes (Signed)
His labs look good  Pt notified

## 2022-08-07 NOTE — Progress Notes (Unsigned)
Cardiology Office Note:    Date:  08/08/2022   ID:  Aaron Richmond, DOB 1945-02-05, MRN [id number]  PCP:  Crist Fat, MD  Cardiologist:  Norman Herrlich, MD    Referring MD: Crist Fat, MD    ASSESSMENT:    1. Nonrheumatic aortic valve insufficiency   2. Aneurysm of ascending aorta without rupture (HCC)   3. Hypertensive heart disease with heart failure (HCC)   4. Permanent atrial fibrillation (HCC)   5. High risk medication use   6. Chronic anticoagulation   7. Nonrheumatic mitral valve regurgitation    PLAN:    In order of problems listed above:  Overall he is done well since hospital discharge his heart failure is mildly decompensated he did take an extra dose of his diuretic on Monday Wednesday and Friday and notify me if his weights do not stabilize and come down At this time I would do further advanced imaging as he is not felt to be a candidate with his overall health and comorbidities for elective aortic root and AVR Heart failure is improved he will continue his multimodality treatment including his loop diuretic and Entresto beta-blocker and SGLT2 inhibitor Rate is well-controlled with his beta-blocker digoxin level therapeutic follow-up labs with his PCP Continue his anticoagulant Stable mitral regurgitation  Next appointment: 3 months   Medication Adjustments/Labs and Tests Ordered: Current medicines are reviewed at length with the patient today.  Concerns regarding medicines are outlined above.  No orders of the defined types were placed in this encounter.  Meds ordered this encounter  Medications   torsemide (DEMADEX) 20 MG tablet    Sig: Take 1 tablet (20 mg total) by mouth 2 (two) times daily. Take an additional tablet of Torsemide on M - W - F    Dispense:  180 tablet    Refill:  3     History of Present Illness:    Aaron Richmond is a 77 y.o. male with a hx of permanent atrial fibrillation since 2021 reviewing office EKGs  cardiomyopathy with normalization of ejection fraction aneurysm ascending aorta aortic insufficiency last seen by Dr. Tomie China 11/09/2021.  He also has sleep apnea followed by [facility].  He was seen with his PCP 07/31/2022 in follow-up to [facility] health admission 07/05/2022 to 07/09/2022 for heart failure exacerbation and atrial fibrillation with rapid ventricular rate.  He was seen by me at [facility] 07/09/2022 for heart failure in the setting of rapid atrial fibrillation moderate aortic and mitral regurgitation.  His proBNP level was severely elevated felt 5970 and atrial fibrillation was subsequently rate controlled with oral calcium channel blocker beta-blocker and digoxin.  He was discharged from the hospital on good guideline directed therapy including torsemide beta-blocker spironolactone and Entresto SGLT2 inhibitor and was to have been seen within 2 weeks of his hospital discharge.  My note relates that I was concerned he had more severe aortic regurgitation than apparent by echocardiography and that he be set up with an outpatient cardiac MRI.  Left ventricle was moderately dilated EF 40 to 45% severe left atrial enlargement mild right atrial margin aortic valve was not well-visualized he had moderate regurgitation the mitral valve was structurally normal with moderate mitral regurgitation and the ascending aorta is moderately to severely dilated at 54 mm.  Compliance with diet, lifestyle and medications: Yes  His wife is present participates in evaluation decision making she is very helpful His weight is up 7 pounds since discharge he was unaware  he now is edema Fortunately otherwise feels well he is not having angina palpitation shortness of breath orthopnea palpitation or syncope Tolerates digoxin without GI side effects his level is therapeutic Has had no bleeding with his anticoagulant He tolerates his statin without muscle pain or weakness  We discussed doing a cardiac MRI to  assess the severity of aortic regurgitation as it could potentially push forward the issue of elective aortic root aneurysm AVR they alert me they had a good discussion with CT surgery in May and he is not felt to be a candidate for elective intervention. At this time I would not do further advanced imaging Past Medical History:  Diagnosis Date   Aneurysm of aortic root (HCC)    Overview:  Last Assessment & Plan:  Planning to see Dr Tyrone Sage for evaluation of 5cm aneurysm.  - will check PFT in prep for possible procedure / SGY   Aortic atherosclerosis (HCC) 06/14/2022   Aortic regurgitation    Aortic valve insufficiency    Ascending aortic aneurysm (HCC) 09/11/2016   Atherosclerotic vascular disease 05/27/2022   Atrial fibrillation (HCC)    Atrial fibrillation with RVR (HCC) 07/05/2022   BMI 27.0-27.9,adult 06/14/2022   BPH (benign prostatic hyperplasia)    Cardiomyopathy (HCC) 09/11/2016   CKD (chronic kidney disease) stage 2, GFR 60-89 ml/min 11/23/2021   Coronary artery calcification seen on CT scan 10/13/2017   Essential hypertension 06/14/2022   High risk medication use 07/31/2022   History of cardiomyopathy 11/09/2021   History of pulmonary embolism 09/24/2012   Overview:  Last Assessment & Plan:  Hx PE x 2 per notes, ? 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  - needs auto-titration study by [employer] or local  company, data to [facility] to adjust his home device.    Preoperative clearance 10/13/2017   Primary osteoarthritis involving multiple joints 06/14/2022   Prostate cancer metastatic to bone (HCC) 06/14/2022   Pulmonary embolism (HCC)    Rhinitis    Seasonal allergies 06/14/2022   Spondylolisthesis of lumbar region 12/12/2021   Stage 3a chronic kidney disease (HCC) 06/14/2022    Current Medications: Current Meds  Medication Sig   acetaminophen (TYLENOL) 500 MG tablet Take 500 mg by mouth every 6 (six) hours as needed for mild pain or moderate pain.   dapagliflozin propanediol (FARXIGA) 5 MG TABS tablet Take 1 tablet (5 mg total) by mouth daily.   digoxin (LANOXIN) 0.125 MG tablet Take 1 tablet (125 mcg total) by mouth daily.   gabapentin (NEURONTIN) 300 MG capsule Take 300 mg by mouth at bedtime. Patient states he is taking two pills once daily   KLOR-CON M20 20 MEQ tablet Take 1 tablet (20 mEq total) by mouth daily.   metoprolol tartrate (LOPRESSOR) 50 MG tablet Take 1.5 tablets (75 mg total) by mouth 2 (two) times daily.   Multiple Vitamin (MULTIVITAMINS PO) Take 1 tablet by mouth daily. Unknown strength   Omega-3  1000 MG CAPS Take 1,200 mg by mouth daily.   rivaroxaban (XARELTO) 20 MG TABS tablet TAKE 1 TABLET BY MOUTH ONCE DAILY WITH SUPPER   rosuvastatin (CRESTOR) 5 MG tablet Take 1 tablet by mouth once daily   sacubitril-valsartan (ENTRESTO) 24-26 MG Take 1 tablet by mouth 2 (two) times daily.   tamsulosin (FLOMAX) 0.4 MG CAPS capsule Take 0.4 mg by mouth daily after supper.   TART CHERRY PO Take 1 tablet by mouth daily. Unknown strenght   torsemide (DEMADEX) 20 MG tablet Take 1 tablet (20 mg total) by mouth 2 (two) times daily. Take an additional tablet of Torsemide on M - W - F   UNABLE TO FIND Take 2 tablets by mouth daily. Med Name: Curamin/ Unknown strength   zinc gluconate 50 MG tablet Take 50 mg by mouth daily.   [DISCONTINUED] torsemide (DEMADEX) 20 MG tablet Take 1 tablet (20 mg  total) by mouth 2 (two) times daily.      EKGs/Labs/Other Studies Reviewed:    The following studies were reviewed today:  Cardiac Studies & Procedures     STRESS TESTS  MYOCARDIAL PERFUSION IMAGING 10/27/2017  Narrative  Nuclear stress EF: 49%.  There was no ST segment deviation noted during stress.  This is a low risk study.  The left ventricular ejection fraction is mildly decreased (45-54%).  Low risk stress nuclear study with normal perfusion mildly dilated left ventricle with mildly reduced global systolic function. Findings suggest nonischemic cardiomyopathy.   ECHOCARDIOGRAM  ECHOCARDIOGRAM COMPLETE 10/09/2021  Narrative ECHOCARDIOGRAM REPORT    Patient Name:   HIYAB EVERARD Date of Exam: 10/09/2021 Medical Rec #:  [id number]        Height:       70.0 in Accession #:    [id number]       Weight:       214.0 lb Date of Birth:  1945-10-14        BSA:          2.148 m Patient Age:    76 years         BP:           120/60 mmHg Patient Gender: M                HR:           92 bpm. Exam Location:  [facility]  Procedure: 2D Echo, Cardiac Doppler, Color Doppler and Strain Analysis  Indications:    Aortic valve insufficiency, etiology of cardiac valve disease unspecified [I35.1 (ICD-10-CM)]; History of pulmonary embolism [Z86.711 (ICD-10-CM)]; Cardiomyopathy, unspecified type (HCC) [I42.9 (ICD-10-CM)]  History:        Patient has prior history of Echocardiogram examinations, most recent 08/07/2021. Cardiomyopathy, CAD, Aortic Valve Disease, Arrythmias:Atrial Fibrillation; Risk Factors:Dyslipidemia. Ascending aortic aneurysm (HCC).  Sonographer:    Margreta Journey RDCS Referring Phys: Rito Ehrlich [facility]  IMPRESSIONS   1. GLS -10.0. Left ventricular ejection fraction, by estimation, is 50 to 55%. The left ventricle has low normal function. The left ventricle has no regional wall motion abnormalities. The left ventricular internal cavity size was mildly to  moderately dilated. Left ventricular diastolic parameters are indeterminate. 2. Right ventricular systolic function is normal. The right ventricular size is normal. There is normal pulmonary artery systolic pressure. 3. Left atrial size was severely dilated. 4. The mitral valve is normal in structure. Mild to moderate mitral valve regurgitation. No evidence of mitral stenosis. 5. The aortic valve is normal in structure. Aortic  valve regurgitation is moderate. No aortic stenosis is present. 6. Aneurysm of the ascending aorta, measuring 51 mm. 7. The inferior vena cava is normal in size with greater than 50% respiratory variability, suggesting right atrial pressure of 3 mmHg.  FINDINGS Left Ventricle: GLS -10.0. Left ventricular ejection fraction, by estimation, is 50 to 55%. The left ventricle has low normal function. The left ventricle has no regional wall motion abnormalities. The left ventricular internal cavity size was mildly to moderately dilated. There is borderline left ventricular hypertrophy. Left ventricular diastolic parameters are indeterminate.  Right Ventricle: The right ventricular size is normal. No increase in right ventricular wall thickness. Right ventricular systolic function is normal. There is normal pulmonary artery systolic pressure. The tricuspid regurgitant velocity is 2.64 m/s, and with an assumed right atrial pressure of 8 mmHg, the estimated right ventricular systolic pressure is 35.9 mmHg.  Left Atrium: Left atrial size was severely dilated.  Right Atrium: Right atrial size was normal in size.  Pericardium: There is no evidence of pericardial effusion.  Mitral Valve: The mitral valve is normal in structure. Mild to moderate mitral valve regurgitation. No evidence of mitral valve stenosis.  Tricuspid Valve: The tricuspid valve is normal in structure. Tricuspid valve regurgitation is mild . No evidence of tricuspid stenosis.  Aortic Valve: The aortic valve is  normal in structure. Aortic valve regurgitation is moderate. Aortic regurgitation PHT measures 497 msec. No aortic stenosis is present. Aortic valve mean gradient measures 8.0 mmHg. Aortic valve peak gradient measures 13.4 mmHg. Aortic valve area, by VTI measures 3.74 cm.  Pulmonic Valve: The pulmonic valve was normal in structure. Pulmonic valve regurgitation is not visualized. No evidence of pulmonic stenosis.  Aorta: The aortic root is normal in size and structure. There is an aneurysm involving the ascending aorta measuring 51 mm.  Venous: The inferior vena cava is normal in size with greater than 50% respiratory variability, suggesting right atrial pressure of 3 mmHg.  IAS/Shunts: No atrial level shunt detected by color flow Doppler.   LEFT VENTRICLE PLAX 2D LVIDd:         6.50 cm   Diastology LVIDs:         5.20 cm   LV e' medial:    6.31 cm/s LV PW:         1.10 cm   LV E/e' medial:  15.3 LV IVS:        1.10 cm   LV e' lateral:   13.90 cm/s LVOT diam:     2.50 cm   LV E/e' lateral: 7.0 LV SV:         138 LV SV Index:   64 LVOT Area:     4.91 cm   RIGHT VENTRICLE             IVC RV Basal diam:  3.40 cm     IVC diam: 2.20 cm RV Mid diam:    2.10 cm RV S prime:     10.90 cm/s TAPSE (M-mode): 2.3 cm  LEFT ATRIUM              Index        RIGHT ATRIUM           Index LA diam:        4.90 cm  2.28 cm/m   RA Area:     25.00 cm LA Vol (A2C):   67.4 ml  31.37 ml/m  RA Volume:   79.50 ml  37.01 ml/m LA  Vol (A4C):   128.0 ml 59.58 ml/m LA Biplane Vol: 95.1 ml  44.27 ml/m AORTIC VALVE AV Area (Vmax):    3.27 cm AV Area (Vmean):   3.29 cm AV Area (VTI):     3.74 cm AV Vmax:           183.00 cm/s AV Vmean:          140.000 cm/s AV VTI:            0.370 m AV Peak Grad:      13.4 mmHg AV Mean Grad:      8.0 mmHg LVOT Vmax:         122.00 cm/s LVOT Vmean:        93.700 cm/s LVOT VTI:          0.282 m LVOT/AV VTI ratio: 0.76 AI PHT:            497 msec  AORTA Ao  Root diam: 4.30 cm Ao Asc diam:  5.10 cm Ao Desc diam: 2.60 cm  MR Peak grad:    98.0 mmHg    TRICUSPID VALVE MR Mean grad:    69.0 mmHg    TR Peak grad:   27.9 mmHg MR Vmax:         495.00 cm/s  TR Vmax:        264.00 cm/s MR Vmean:        397.0 cm/s MR PISA:         0.57 cm     SHUNTS MR PISA Eff ROA: 5 mm        Systemic VTI:  0.28 m MR PISA Radius:  0.30 cm      Systemic Diam: 2.50 cm MV E velocity: 96.70 cm/s  Gypsy Balsam MD Electronically signed by Gypsy Balsam MD Signature Date/Time: 10/09/2021/5:15:03 PM    Final                 Recent Labs: 06/14/2022: ALT 11; Hemoglobin 12.3; Platelets 202; TSH 1.400 07/31/2022: BUN 28; Creatinine, Ser 1.18; Magnesium 2.1; Potassium 4.8; Sodium 145  Recent Lipid Panel    Component Value Date/Time   CHOL 122 06/14/2022 1155   TRIG 85 06/14/2022 1155   HDL 45 06/14/2022 1155   CHOLHDL 2.7 06/14/2022 1155   LDLCALC 60 06/14/2022 1155      Component Ref Range & Units 8 d ago  Digoxin, Serum 0.5 - 0.9 ng/mL 0.7     Physical Exam:    VS:  BP 110/60   Pulse 90   Ht 5\' 10"  (1.778 m)   Wt 180 lb 9.6 oz (81.9 kg)   SpO2 95%   BMI 25.91 kg/m     Wt Readings from Last 3 Encounters:  08/08/22 180 lb 9.6 oz (81.9 kg)  08/01/22 179 lb 3.2 oz (81.3 kg)  07/31/22 179 lb 6.4 oz (81.4 kg)     GEN:  Well nourished, well developed in no acute distress HEENT: Normal NECK: No JVD; No carotid bruits LYMPHATICS: No lymphadenopathy CARDIAC: Irregular rate and rhythm variable first heart sound grade 2/6 to 3/6 murmur aortic regurgitation left sternal border no S3 S1 is variable  RESPIRATORY:  Clear to auscultation without rales, wheezing or rhonchi  ABDOMEN: Soft, non-tender, non-distended MUSCULOSKELETAL: 2+ bilateral lower extremity pitting edema; No deformity  SKIN: Warm and dry NEUROLOGIC:  Alert and oriented x 3 PSYCHIATRIC:  Normal affect    Signed, Norman Herrlich, MD  08/08/2022 5:01 PM    [facility]

## 2022-08-08 ENCOUNTER — Encounter: Payer: Self-pay | Admitting: Cardiology

## 2022-08-08 ENCOUNTER — Ambulatory Visit: Payer: PPO | Attending: Cardiology | Admitting: Cardiology

## 2022-08-08 VITALS — BP 110/60 | HR 90 | Ht 70.0 in | Wt 180.6 lb

## 2022-08-08 DIAGNOSIS — Z7901 Long term (current) use of anticoagulants: Secondary | ICD-10-CM | POA: Diagnosis not present

## 2022-08-08 DIAGNOSIS — I7121 Aneurysm of the ascending aorta, without rupture: Secondary | ICD-10-CM

## 2022-08-08 DIAGNOSIS — I340 Nonrheumatic mitral (valve) insufficiency: Secondary | ICD-10-CM

## 2022-08-08 DIAGNOSIS — Z79899 Other long term (current) drug therapy: Secondary | ICD-10-CM

## 2022-08-08 DIAGNOSIS — I4821 Permanent atrial fibrillation: Secondary | ICD-10-CM

## 2022-08-08 DIAGNOSIS — I351 Nonrheumatic aortic (valve) insufficiency: Secondary | ICD-10-CM | POA: Diagnosis not present

## 2022-08-08 DIAGNOSIS — I110 Hypertensive heart disease with heart failure: Secondary | ICD-10-CM | POA: Diagnosis not present

## 2022-08-08 MED ORDER — TORSEMIDE 20 MG PO TABS: 20.0000 mg | ORAL_TABLET | Freq: Two times a day (BID) | ORAL | 3 refills | Status: DC

## 2022-08-08 NOTE — Patient Instructions (Signed)
Medication Instructions:  Your physician has recommended you make the following change in your medication:   START: Torsemide 20 mg twice daily (Take an additional tablet of Torsemide on M - W - F).  *If you need a refill on your cardiac medications before your next appointment, please call your pharmacy*   Lab Work: None If you have labs (blood work) drawn today and your tests are completely normal, you will receive your results only by: MyChart Message (if you have MyChart) OR A paper copy in the mail If you have any lab test that is abnormal or we need to change your treatment, we will call you to review the results.   Testing/Procedures: None   Follow-Up: At [facility], you and your health needs are our priority.  As part of our continuing mission to provide you with exceptional heart care, we have created designated Provider Care Teams.  These Care Teams include your primary Cardiologist (physician) and Advanced Practice Providers (APPs -  Physician Assistants and Nurse Practitioners) who all work together to provide you with the care you need, when you need it.  We recommend signing up for the patient portal called "MyChart".  Sign up information is provided on this After Visit Summary.  MyChart is used to connect with patients for Virtual Visits (Telemedicine).  Patients are able to view lab/test results, encounter notes, upcoming appointments, etc.  Non-urgent messages can be sent to your provider as well.   To learn more about what you can do with MyChart, go to [email or web address].    Your next appointment:   3 month(s)  Provider:   Norman Herrlich, MD    Other Instructions None

## 2022-08-22 ENCOUNTER — Encounter: Payer: Self-pay | Admitting: Internal Medicine

## 2022-08-22 DIAGNOSIS — M533 Sacrococcygeal disorders, not elsewhere classified: Secondary | ICD-10-CM | POA: Diagnosis not present

## 2022-08-22 DIAGNOSIS — M25551 Pain in right hip: Secondary | ICD-10-CM | POA: Diagnosis not present

## 2022-08-29 DIAGNOSIS — I509 Heart failure, unspecified: Secondary | ICD-10-CM | POA: Diagnosis not present

## 2022-08-29 DIAGNOSIS — I110 Hypertensive heart disease with heart failure: Secondary | ICD-10-CM | POA: Diagnosis not present

## 2022-08-29 DIAGNOSIS — D6869 Other thrombophilia: Secondary | ICD-10-CM | POA: Diagnosis not present

## 2022-08-29 DIAGNOSIS — I480 Paroxysmal atrial fibrillation: Secondary | ICD-10-CM | POA: Diagnosis not present

## 2022-08-29 DIAGNOSIS — Z7901 Long term (current) use of anticoagulants: Secondary | ICD-10-CM | POA: Diagnosis not present

## 2022-09-11 ENCOUNTER — Other Ambulatory Visit: Payer: Self-pay | Admitting: Cardiology

## 2022-09-11 DIAGNOSIS — I429 Cardiomyopathy, unspecified: Secondary | ICD-10-CM

## 2022-09-13 ENCOUNTER — Encounter: Payer: Self-pay | Admitting: Internal Medicine

## 2022-09-13 ENCOUNTER — Ambulatory Visit: Payer: PPO | Admitting: Internal Medicine

## 2022-09-13 VITALS — BP 122/52 | HR 64 | Temp 97.8°F | Ht 70.0 in | Wt 178.4 lb

## 2022-09-13 DIAGNOSIS — I480 Paroxysmal atrial fibrillation: Secondary | ICD-10-CM

## 2022-09-13 DIAGNOSIS — G4733 Obstructive sleep apnea (adult) (pediatric): Secondary | ICD-10-CM | POA: Diagnosis not present

## 2022-09-13 DIAGNOSIS — R7303 Prediabetes: Secondary | ICD-10-CM | POA: Insufficient documentation

## 2022-09-13 DIAGNOSIS — N1831 Chronic kidney disease, stage 3a: Secondary | ICD-10-CM | POA: Diagnosis not present

## 2022-09-13 DIAGNOSIS — I10 Essential (primary) hypertension: Secondary | ICD-10-CM

## 2022-09-13 HISTORY — DX: Obstructive sleep apnea (adult) (pediatric): G47.33

## 2022-09-13 MED ORDER — TORSEMIDE 20 MG PO TABS: ORAL_TABLET | ORAL | 3 refills | Status: DC

## 2022-09-13 NOTE — Assessment & Plan Note (Signed)
He is on entresto and his BP is stable.  We will continue his current meds.

## 2022-09-13 NOTE — Assessment & Plan Note (Signed)
This is controlled with diet.  We will recheck his HgBA1c on his next visit in 3 months.

## 2022-09-13 NOTE — Assessment & Plan Note (Signed)
His kidney function has been stable.  He is on an ACE-I thru his entresto and he remains on farxiga.  He is to avoid all NSAIDS.

## 2022-09-13 NOTE — Progress Notes (Signed)
Office Visit  Subjective   Patient ID: Aaron Richmond   DOB: 1946/01/01   Age: 77 y.o.   MRN: [id number]   Chief Complaint Chief Complaint  Patient presents with   Follow-up    Rt lbp x 1 year ongoing pain     History of Present Illness Aaron Richmond returns for followup of his OSA.  I saw him 3 months ago where we referred back to pulmonary as he needed a new CPAP machine.  Over the interim, he did see pulmonary and they have scheduled this month for a repeat overnight sleep study.  He does have a history of OSA which was diagnosed around 2010.  He remains on his old CPAP at night.  Otherwise he denies any problems.    The patient is a 77 year old Caucasian/White male who presents for a follow-up evaluation of hypertension.  Since his last visit, he states he has not had any problems.   The patient has not been checking his blood pressure at home.  The patient's current medications include:  metoprolol tartrate 75 mg BID and entresto 24/26mg  BID and torsemide 20mg  BID except on M/W/F he takes 40mg  in AM and 20mg  in the evening. The patient has been tolerating his medications well. The patient denies any headache, visual changes, dizziness, lightheadness, chest pain, shortness of breath, weakness/numbness, and edema.  He reports there have been no other symptoms noted.   He does have a history of Stage II-III CKD which has been going on for the last 6-7 years.  He has been stable and his GFR has been running in the 60's with a creatinine baseline 1.2-1.3.  I started him on farxiga for renal protection in 2022 and he states he is not having any problems or side effects.  He is avoiding NSAIDS.  Aaron Richmond again was seen a few months ago where he was admitted to [facility] from 07/05/2022 until 07/09/2022 for A Fib with RVR and CHF exacerbation.  I saw him in the office on 07/05/2022 where he was having symptomatic A. Fib with RVR with a HR of 130's in the office.  He was diagnosed last year with A. Fib where  he was admitted for several days where it was difficult to control his HR.  They stopped him off diltiazem when he was placed on entresto.  I spoke to cardiology and they felt it was best for him to be sent to the ER for workup where he was seen in the ER on 07/05/2022.  They did lab workup and his initial CXR showed some vascular congestion.  They placed him on a cardizem drip where he had improvement with his HR.  They uptitrated his metoprolol.  The patient was seen by cardiology and they started him on digoxin.  They diuresed him with iv lasix where his dyspnea and hypoxia improved.  He has seen Dr. Dulce Sellar back in 08/2022 and they adjusted his torsemide as above as he has some edema at that time.  He has no edema today.   We also noted on his yearly labs that his HgBa1c was elevated at 6.2% showing prediabetes.  We asked him at that time to watch his diet and continue to be active.  He is not checking his blood sugars at home.  He has no abdominal pain, nausea or vomiting.  His last HgBA1c was done on 06/14/2022 and was 6.2%.     Past Medical History Past Medical History:  Diagnosis  Date   Aneurysm of aortic root (HCC)    Overview:  Last Assessment & Plan:  Planning to see Dr Tyrone Sage for evaluation of 5cm aneurysm.  - will check PFT in prep for possible procedure / SGY   Aortic atherosclerosis (HCC) 06/14/2022   Aortic regurgitation    Aortic valve insufficiency    Ascending aortic aneurysm (HCC) 09/11/2016   Atherosclerotic vascular disease 05/27/2022   Atrial fibrillation (HCC)    Atrial fibrillation with RVR (HCC) 07/05/2022   BMI 27.0-27.9,adult 06/14/2022   BPH (benign prostatic hyperplasia)    Cardiomyopathy (HCC) 09/11/2016   CKD (chronic kidney disease) stage 2, GFR 60-89 ml/min 11/23/2021   Coronary artery calcification seen on CT scan 10/13/2017   Essential hypertension 06/14/2022   High risk medication use 07/31/2022   History of cardiomyopathy 11/09/2021   History of pulmonary  embolism 09/24/2012   Overview:  Last Assessment & Plan:  Hx PE x 2 per notes, ? 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  - needs auto-titration study by [employer] or local company, data to RB to adjust his home device.    Prediabetes    Preoperative clearance 10/13/2017   Primary osteoarthritis involving multiple joints 06/14/2022   Prostate cancer metastatic to bone (HCC) 06/14/2022   Pulmonary embolism (HCC)    Rhinitis    Seasonal allergies 06/14/2022   Spondylolisthesis of lumbar region 12/12/2021   Stage 3a chronic kidney disease (HCC) 06/14/2022     Allergies Allergies  Allergen Reactions   Ciprofloxacin     Contraindicated because of increased risk of rupture of ascending thoracic aortic aneurysm     Medications  Current Outpatient Medications:    acetaminophen (TYLENOL) 500 MG tablet, Take 500 mg by mouth every 6 (six) hours as needed for mild pain or moderate pain., Disp: , Rfl:    dapagliflozin propanediol (FARXIGA) 5 MG TABS tablet, Take 1 tablet (5 mg total) by mouth daily., Disp: 90 tablet, Rfl: 1   digoxin (LANOXIN) 0.125 MG tablet, Take 1 tablet (125 mcg total) by mouth daily., Disp: 90 tablet, Rfl: 3   gabapentin (NEURONTIN) 300 MG capsule, Take 300 mg by mouth at bedtime. Patient states he is taking two pills once daily, Disp: , Rfl:    KLOR-CON M20 20 MEQ tablet, Take 1 tablet (20 mEq total) by mouth  daily., Disp: 90 tablet, Rfl: 3   metoprolol tartrate (LOPRESSOR) 50 MG tablet, Take 1.5 tablets (75 mg total) by mouth 2 (two) times daily., Disp: 180 tablet, Rfl: 1   Multiple Vitamin (MULTIVITAMINS PO), Take 1 tablet by mouth daily. Unknown strength, Disp: , Rfl:    rivaroxaban (XARELTO) 20 MG TABS tablet, TAKE 1 TABLET BY MOUTH ONCE DAILY WITH SUPPER, Disp: 90 tablet, Rfl: 1   rosuvastatin (CRESTOR) 5 MG tablet, Take 1 tablet by mouth once daily, Disp: 90 tablet, Rfl: 2   sacubitril-valsartan (ENTRESTO) 24-26 MG, Take 1 tablet by mouth twice daily,  Disp: 60 tablet, Rfl: 3   tamsulosin (FLOMAX) 0.4 MG CAPS capsule, Take 0.4 mg by mouth daily after supper., Disp: , Rfl:    torsemide (DEMADEX) 20 MG tablet, Take 1 tablet (20 mg total) by mouth 2 (two) times daily. Take an additional tablet of Torsemide on M - W - F, Disp: 180 tablet, Rfl: 3   UNABLE TO FIND, Take 2 tablets by mouth daily. Med Name: Curamin/ Unknown strength, Disp: , Rfl:    Review of Systems Review of Systems  Constitutional:  Negative for chills and fever.  Eyes:  Negative for blurred vision and double vision.  Respiratory:  Negative for cough and shortness of breath.   Cardiovascular:  Negative for chest pain, palpitations and leg swelling.  Gastrointestinal:  Negative for abdominal pain, constipation, diarrhea, nausea and vomiting.  Genitourinary:  Negative for frequency and hematuria.  Musculoskeletal:  Positive for back pain. Negative for myalgias.  Skin:  Negative for itching and rash.  Neurological:  Negative for dizziness, weakness and headaches.  Endo/Heme/Allergies:  Negative for polydipsia.       Objective:    Vitals BP (!) 122/52 (BP Location: Right Arm, Patient Position: Sitting, Cuff Size: Normal)   Pulse 64   Temp 97.8 F (36.6 C)   Ht 5\' 10"  (1.778 m)   Wt 178 lb 6 oz (80.9 kg)   SpO2 94%   BMI 25.59 kg/m    Physical Examination Physical Exam Constitutional:      Appearance: Normal  appearance. He is not ill-appearing.  Cardiovascular:     Rate and Rhythm: Normal rate and regular rhythm.     Pulses: Normal pulses.     Heart sounds: No murmur heard.    No friction rub. No gallop.  Pulmonary:     Effort: Pulmonary effort is normal. No respiratory distress.     Breath sounds: No wheezing, rhonchi or rales.  Abdominal:     General: Abdomen is flat. Bowel sounds are normal. There is no distension.     Palpations: Abdomen is soft.     Tenderness: There is no abdominal tenderness.  Musculoskeletal:     Right lower leg: No edema.     Left lower leg: No edema.  Skin:    General: Skin is warm and dry.     Findings: No rash.  Neurological:     General: No focal deficit present.     Mental Status: He is alert and oriented to person, place, and time.  Psychiatric:        Mood and Affect: Mood normal.        Behavior: Behavior normal.        Assessment & Plan:   Essential hypertension He is on entresto and his BP is stable.  We will continue his current meds.  Atrial fibrillation His HR is controlled and he remains anticoagulated with xarelto.  He is not having any volume overload on exam today.  OSA (obstructive sleep apnea) He will go for his sleep study and for a replacement CPAP depending on these results.  Stage 3a chronic kidney disease (HCC) His kidney function has been stable.  He is on an ACE-I thru his entresto and he remains on farxiga.  He is to avoid all NSAIDS.  Prediabetes This is controlled with diet.  We will recheck his HgBA1c on his next visit in 3 months.    Return in about 3 months (around 12/13/2022).   Crist Fat, MD

## 2022-09-13 NOTE — Assessment & Plan Note (Signed)
His HR is controlled and he remains anticoagulated with xarelto.  He is not having any volume overload on exam today.

## 2022-09-13 NOTE — Assessment & Plan Note (Signed)
He will go for his sleep study and for a replacement CPAP depending on these results.

## 2022-09-16 DIAGNOSIS — C61 Malignant neoplasm of prostate: Secondary | ICD-10-CM | POA: Diagnosis not present

## 2022-09-20 ENCOUNTER — Other Ambulatory Visit: Payer: Self-pay | Admitting: Internal Medicine

## 2022-09-23 DIAGNOSIS — C775 Secondary and unspecified malignant neoplasm of intrapelvic lymph nodes: Secondary | ICD-10-CM | POA: Diagnosis not present

## 2022-09-23 DIAGNOSIS — C61 Malignant neoplasm of prostate: Secondary | ICD-10-CM | POA: Diagnosis not present

## 2022-09-24 ENCOUNTER — Other Ambulatory Visit: Payer: Self-pay | Admitting: Cardiology

## 2022-09-24 ENCOUNTER — Ambulatory Visit (HOSPITAL_BASED_OUTPATIENT_CLINIC_OR_DEPARTMENT_OTHER): Payer: PPO | Attending: Primary Care | Admitting: Internal Medicine

## 2022-09-27 DIAGNOSIS — M4316 Spondylolisthesis, lumbar region: Secondary | ICD-10-CM | POA: Diagnosis not present

## 2022-09-27 DIAGNOSIS — M5416 Radiculopathy, lumbar region: Secondary | ICD-10-CM | POA: Diagnosis not present

## 2022-09-27 DIAGNOSIS — M533 Sacrococcygeal disorders, not elsewhere classified: Secondary | ICD-10-CM | POA: Diagnosis not present

## 2022-10-03 DIAGNOSIS — M4319 Spondylolisthesis, multiple sites in spine: Secondary | ICD-10-CM | POA: Diagnosis not present

## 2022-10-03 DIAGNOSIS — M533 Sacrococcygeal disorders, not elsewhere classified: Secondary | ICD-10-CM | POA: Diagnosis not present

## 2022-10-03 DIAGNOSIS — M48061 Spinal stenosis, lumbar region without neurogenic claudication: Secondary | ICD-10-CM | POA: Diagnosis not present

## 2022-10-03 DIAGNOSIS — Z981 Arthrodesis status: Secondary | ICD-10-CM | POA: Diagnosis not present

## 2022-10-03 DIAGNOSIS — M4316 Spondylolisthesis, lumbar region: Secondary | ICD-10-CM | POA: Diagnosis not present

## 2022-10-03 DIAGNOSIS — C801 Malignant (primary) neoplasm, unspecified: Secondary | ICD-10-CM | POA: Diagnosis not present

## 2022-10-03 DIAGNOSIS — C7951 Secondary malignant neoplasm of bone: Secondary | ICD-10-CM | POA: Diagnosis not present

## 2022-10-03 DIAGNOSIS — M4807 Spinal stenosis, lumbosacral region: Secondary | ICD-10-CM | POA: Diagnosis not present

## 2022-10-07 DIAGNOSIS — C61 Malignant neoplasm of prostate: Secondary | ICD-10-CM | POA: Diagnosis not present

## 2022-10-07 DIAGNOSIS — D692 Other nonthrombocytopenic purpura: Secondary | ICD-10-CM | POA: Diagnosis not present

## 2022-10-07 DIAGNOSIS — M519 Unspecified thoracic, thoracolumbar and lumbosacral intervertebral disc disorder: Secondary | ICD-10-CM | POA: Diagnosis not present

## 2022-10-11 DIAGNOSIS — M4316 Spondylolisthesis, lumbar region: Secondary | ICD-10-CM | POA: Diagnosis not present

## 2022-10-11 DIAGNOSIS — M5416 Radiculopathy, lumbar region: Secondary | ICD-10-CM | POA: Diagnosis not present

## 2022-10-11 DIAGNOSIS — M533 Sacrococcygeal disorders, not elsewhere classified: Secondary | ICD-10-CM | POA: Diagnosis not present

## 2022-10-16 ENCOUNTER — Other Ambulatory Visit: Payer: Self-pay | Admitting: Cardiothoracic Surgery

## 2022-10-16 DIAGNOSIS — I7121 Aneurysm of the ascending aorta, without rupture: Secondary | ICD-10-CM

## 2022-10-22 ENCOUNTER — Ambulatory Visit (HOSPITAL_BASED_OUTPATIENT_CLINIC_OR_DEPARTMENT_OTHER): Payer: PPO | Attending: Primary Care | Admitting: Internal Medicine

## 2022-10-22 VITALS — Ht 70.0 in | Wt 174.0 lb

## 2022-10-22 DIAGNOSIS — R0683 Snoring: Secondary | ICD-10-CM | POA: Insufficient documentation

## 2022-10-22 DIAGNOSIS — G4733 Obstructive sleep apnea (adult) (pediatric): Secondary | ICD-10-CM | POA: Diagnosis not present

## 2022-10-22 DIAGNOSIS — G4736 Sleep related hypoventilation in conditions classified elsewhere: Secondary | ICD-10-CM | POA: Diagnosis not present

## 2022-10-22 DIAGNOSIS — I493 Ventricular premature depolarization: Secondary | ICD-10-CM | POA: Diagnosis not present

## 2022-10-24 DIAGNOSIS — M5416 Radiculopathy, lumbar region: Secondary | ICD-10-CM | POA: Diagnosis not present

## 2022-10-24 DIAGNOSIS — M533 Sacrococcygeal disorders, not elsewhere classified: Secondary | ICD-10-CM | POA: Diagnosis not present

## 2022-10-24 DIAGNOSIS — M4316 Spondylolisthesis, lumbar region: Secondary | ICD-10-CM | POA: Diagnosis not present

## 2022-10-31 ENCOUNTER — Other Ambulatory Visit: Payer: Self-pay

## 2022-10-31 DIAGNOSIS — C61 Malignant neoplasm of prostate: Secondary | ICD-10-CM

## 2022-11-06 ENCOUNTER — Other Ambulatory Visit (HOSPITAL_COMMUNITY): Payer: Self-pay | Admitting: Urology

## 2022-11-06 ENCOUNTER — Encounter (HOSPITAL_COMMUNITY)
Admission: RE | Admit: 2022-11-06 | Discharge: 2022-11-06 | Disposition: A | Discharge status: Home / Self Care | Payer: PPO | Source: Ambulatory Visit | Attending: Radiation Oncology | Admitting: Radiation Oncology

## 2022-11-06 DIAGNOSIS — C61 Malignant neoplasm of prostate: Secondary | ICD-10-CM | POA: Insufficient documentation

## 2022-11-06 MED ORDER — FLOTUFOLASTAT F 18 GALLIUM 296-5846 MBQ/ML IV SOLN
8.3000 | Freq: Once | INTRAVENOUS | Status: AC
  Administered 2022-11-06: 8.3 via INTRAVENOUS

## 2022-11-07 NOTE — Progress Notes (Signed)
Cardiology Office Note:    Date:  11/08/2022   ID:  Aaron Richmond, DOB [birth year], MRN [id number]  PCP:  Crist Fat, MD  Cardiologist:  Norman Herrlich, MD    Referring MD: Crist Fat, MD    ASSESSMENT:    1. Permanent atrial fibrillation (HCC)   2. High risk medication use   3. Chronic anticoagulation   4. Aneurysm of ascending aorta without rupture (HCC)   5. Nonrheumatic aortic valve insufficiency   6. Hypertensive heart disease with heart failure (HCC)    PLAN:    In order of problems listed above:  Overall he has done well his atrial fibrillation is rate controlled we will continue his beta-blocker stop digoxin Continue his current anticoagulant no bleeding complication His heart failure is compensated we will continue his loop diuretic SGLT2 inhibitor and Entresto and beta-blocker. Recheck labs BMP proBNP CBC   Next appointment: 3 months   Medication Adjustments/Labs and Tests Ordered: Current medicines are reviewed at length with the patient today.  Concerns regarding medicines are outlined above.  Orders Placed This Encounter  Procedures   EKG 12-Lead   No orders of the defined types were placed in this encounter.    History of Present Illness:    Aaron Richmond is a 77 y.o. male with a hx of permanent atrial fibrillation since 2021 cardiomyopathy with normalization of ejection fraction aneurysm ascending aortic and aortic insufficiency and obstructive sleep apnea last seen 08/08/2022.He was seen by me at [facility] 07/09/2022 for heart failure in the setting of rapid atrial fibrillation moderate aortic and mitral regurgitation. His proBNP level was severely elevated felt 5970 and atrial fibrillation was subsequently rate controlled with oral calcium channel blocker beta-blocker and digoxin.  He ejection fraction 45% with moderate aortic and mitral regurgitation.  Compliance with diet, lifestyle and medications: Yes  Overall he has done well  heart failure is not an issue no edema shortness of breath and his heart rates run 60 to 90 bpm at home He is very upset that he is lost his appetite he is on digoxin rate is controlled we will discontinue His primary problem is back pain and limitations He has had no bleeding from his anticoagulant He has labs following his PCP office Past Medical History:  Diagnosis Date   Aneurysm of aortic root    Overview:  Last Assessment & Plan:  Planning to see Dr Tyrone Sage for evaluation of 5cm aneurysm.  - will check PFT in prep for possible procedure / SGY   Aortic atherosclerosis (HCC) 06/14/2022   Aortic regurgitation    Aortic valve insufficiency    Ascending aortic aneurysm (HCC) 09/11/2016   Atherosclerotic vascular disease 05/27/2022   Atrial fibrillation (HCC)    Atrial fibrillation with RVR (HCC) 07/05/2022   BMI 27.0-27.9,adult 06/14/2022   BPH (benign prostatic hyperplasia)    Cardiomyopathy (HCC) 09/11/2016   CKD (chronic kidney disease) stage 2, GFR 60-89 ml/min 11/23/2021   Coronary artery calcification seen on CT scan 10/13/2017   Essential hypertension 06/14/2022   High risk medication use 07/31/2022   History of cardiomyopathy 11/09/2021   History of pulmonary embolism 09/24/2012   Overview:  Last Assessment & Plan:  Hx PE x 2 per notes, ? Whether he was treated adequately  - check hypercoag panel prior to initiation of any anticoag for his A fib (or recurrent PE)   Hypercholesterolemia 06/14/2022   Hyperlipidemia    Hypertension    Idiopathic medial aortopathy and  arteriopathy (HCC) 05/14/2021   Lumbar radiculopathy 01/24/2022   Malignant neoplasm of prostate (HCC) 10/04/2020   Malignant neoplasm of prostate metastatic to bone (HCC) 10/04/2020   Mitral valve insufficiency 06/14/2022   Obesity (BMI 30-39.9) 10/16/2017   Obstructive sleep apnea syndrome 09/24/2012   Overview:  Last Assessment & Plan:  Has [employer] patient, owns his machine.  - needs auto-titration  study by [employer] or local company, data to RB to adjust his home device.    Prediabetes    Preoperative clearance 10/13/2017   Primary osteoarthritis involving multiple joints 06/14/2022   Prostate cancer metastatic to bone (HCC) 06/14/2022   Pulmonary embolism (HCC)    Rhinitis    Seasonal allergies 06/14/2022   Spondylolisthesis of lumbar region 12/12/2021   Stage 3a chronic kidney disease (HCC) 06/14/2022    Current Medications: Current Meds  Medication Sig   acetaminophen (TYLENOL) 500 MG tablet Take 500 mg by mouth every 6 (six) hours as needed for mild pain or moderate pain.   dapagliflozin propanediol (FARXIGA) 5 MG TABS tablet Take 1 tablet (5 mg total) by mouth daily.   digoxin (LANOXIN) 0.125 MG tablet Take 1 tablet (125 mcg total) by mouth daily.   KLOR-CON M20 20 MEQ tablet Take 1 tablet (20 mEq total) by mouth daily.   metoprolol tartrate (LOPRESSOR) 50 MG tablet Take 1.5 tablets (75 mg total) by mouth 2 (two) times daily.   Multiple Vitamin (MULTIVITAMINS PO) Take 1 tablet by mouth daily. Unknown strength   rivaroxaban (XARELTO) 20 MG TABS tablet TAKE 1 TABLET BY MOUTH ONCE DAILY WITH SUPPER (Patient taking differently: Take 20 mg by mouth daily with supper.)   rosuvastatin (CRESTOR) 5 MG tablet Take 1 tablet (5 mg total) by mouth daily.   sacubitril-valsartan (ENTRESTO) 24-26 MG Take 1 tablet by mouth twice daily   tamsulosin (FLOMAX) 0.4 MG CAPS capsule TAKE 1 CAPSULE BY MOUTH TWICE DAILY 30 MINUTES FOLLOWING THE SAME MEAL EACH DAY (Patient taking differently: Take 0.4 mg by mouth 2 (two) times daily.)   torsemide (DEMADEX) 20 MG tablet Take one tab po BID except on M/W/F take 2 tabs po in AM and 1 tab po in PM (Patient taking differently: Take 20 mg by mouth 2 (two) times daily. Take one tab po BID except on M/W/F take 2 tabs po in AM and 1 tab po in PM)   UNABLE TO FIND Take 2 tablets by mouth daily. Med Name: Curamin/ Unknown strength   [DISCONTINUED] gabapentin (NEURONTIN)  300 MG capsule Take 300 mg by mouth at bedtime. Patient states he is taking two pills once daily      EKGs/Labs/Other Studies Reviewed:    The following studies were reviewed today:  Cardiac Studies & Procedures     STRESS TESTS  MYOCARDIAL PERFUSION IMAGING 10/27/2017  Interpretation Summary  Nuclear stress EF: 49%.  There was no ST segment deviation noted during stress.  This is a low risk study.  The left ventricular ejection fraction is mildly decreased (45-54%).  Low risk stress nuclear study with normal perfusion mildly dilated left ventricle with mildly reduced global systolic function. Findings suggest nonischemic cardiomyopathy.   ECHOCARDIOGRAM  ECHOCARDIOGRAM COMPLETE 10/09/2021  Narrative ECHOCARDIOGRAM REPORT    Patient Name:   RASHAUN WICHERT Date of Exam: 10/09/2021 Medical Rec #:  [id number]        Height:       70.0 in Accession #:    [id number]       Weight:  214.0 lb Date of Birth:  [birth year]        BSA:          2.148 m Patient Age:    76 years         BP:           120/60 mmHg Patient Gender: M                HR:           92 bpm. Exam Location:  [facility]  Procedure: 2D Echo, Cardiac Doppler, Color Doppler and Strain Analysis  Indications:    Aortic valve insufficiency, etiology of cardiac valve disease unspecified [I35.1 (ICD-10-CM)]; History of pulmonary embolism [Z86.711 (ICD-10-CM)]; Cardiomyopathy, unspecified type (HCC) [I42.9 (ICD-10-CM)]  History:        Patient has prior history of Echocardiogram examinations, most recent 08/07/2021. Cardiomyopathy, CAD, Aortic Valve Disease, Arrythmias:Atrial Fibrillation; Risk Factors:Dyslipidemia. Ascending aortic aneurysm (HCC).  Sonographer:    Margreta Journey RDCS Referring Phys: Rito Ehrlich [facility]  IMPRESSIONS   1. GLS -10.0. Left ventricular ejection fraction, by estimation, is 50 to 55%. The left ventricle has low normal function. The left ventricle has no regional wall motion  abnormalities. The left ventricular internal cavity size was mildly to moderately dilated. Left ventricular diastolic parameters are indeterminate. 2. Right ventricular systolic function is normal. The right ventricular size is normal. There is normal pulmonary artery systolic pressure. 3. Left atrial size was severely dilated. 4. The mitral valve is normal in structure. Mild to moderate mitral valve regurgitation. No evidence of mitral stenosis. 5. The aortic valve is normal in structure. Aortic valve regurgitation is moderate. No aortic stenosis is present. 6. Aneurysm of the ascending aorta, measuring 51 mm. 7. The inferior vena cava is normal in size with greater than 50% respiratory variability, suggesting right atrial pressure of 3 mmHg.  FINDINGS Left Ventricle: GLS -10.0. Left ventricular ejection fraction, by estimation, is 50 to 55%. The left ventricle has low normal function. The left ventricle has no regional wall motion abnormalities. The left ventricular internal cavity size was mildly to moderately dilated. There is borderline left ventricular hypertrophy. Left ventricular diastolic parameters are indeterminate.  Right Ventricle: The right ventricular size is normal. No increase in right ventricular wall thickness. Right ventricular systolic function is normal. There is normal pulmonary artery systolic pressure. The tricuspid regurgitant velocity is 2.64 m/s, and with an assumed right atrial pressure of 8 mmHg, the estimated right ventricular systolic pressure is 35.9 mmHg.  Left Atrium: Left atrial size was severely dilated.  Right Atrium: Right atrial size was normal in size.  Pericardium: There is no evidence of pericardial effusion.  Mitral Valve: The mitral valve is normal in structure. Mild to moderate mitral valve regurgitation. No evidence of mitral valve stenosis.  Tricuspid Valve: The tricuspid valve is normal in structure. Tricuspid valve regurgitation is mild . No  evidence of tricuspid stenosis.  Aortic Valve: The aortic valve is normal in structure. Aortic valve regurgitation is moderate. Aortic regurgitation PHT measures 497 msec. No aortic stenosis is present. Aortic valve mean gradient measures 8.0 mmHg. Aortic valve peak gradient measures 13.4 mmHg. Aortic valve area, by VTI measures 3.74 cm.  Pulmonic Valve: The pulmonic valve was normal in structure. Pulmonic valve regurgitation is not visualized. No evidence of pulmonic stenosis.  Aorta: The aortic root is normal in size and structure. There is an aneurysm involving the ascending aorta measuring 51 mm.  Venous: The inferior vena cava is  normal in size with greater than 50% respiratory variability, suggesting right atrial pressure of 3 mmHg.  IAS/Shunts: No atrial level shunt detected by color flow Doppler.   LEFT VENTRICLE PLAX 2D LVIDd:         6.50 cm   Diastology LVIDs:         5.20 cm   LV e' medial:    6.31 cm/s LV PW:         1.10 cm   LV E/e' medial:  15.3 LV IVS:        1.10 cm   LV e' lateral:   13.90 cm/s LVOT diam:     2.50 cm   LV E/e' lateral: 7.0 LV SV:         138 LV SV Index:   64 LVOT Area:     4.91 cm   RIGHT VENTRICLE             IVC RV Basal diam:  3.40 cm     IVC diam: 2.20 cm RV Mid diam:    2.10 cm RV S prime:     10.90 cm/s TAPSE (M-mode): 2.3 cm  LEFT ATRIUM              Index        RIGHT ATRIUM           Index LA diam:        4.90 cm  2.28 cm/m   RA Area:     25.00 cm LA Vol (A2C):   67.4 ml  31.37 ml/m  RA Volume:   79.50 ml  37.01 ml/m LA Vol (A4C):   128.0 ml 59.58 ml/m LA Biplane Vol: 95.1 ml  44.27 ml/m AORTIC VALVE AV Area (Vmax):    3.27 cm AV Area (Vmean):   3.29 cm AV Area (VTI):     3.74 cm AV Vmax:           183.00 cm/s AV Vmean:          140.000 cm/s AV VTI:            0.370 m AV Peak Grad:      13.4 mmHg AV Mean Grad:      8.0 mmHg LVOT Vmax:         122.00 cm/s LVOT Vmean:        93.700 cm/s LVOT VTI:          0.282  m LVOT/AV VTI ratio: 0.76 AI PHT:            497 msec  AORTA Ao Root diam: 4.30 cm Ao Asc diam:  5.10 cm Ao Desc diam: 2.60 cm  MR Peak grad:    98.0 mmHg    TRICUSPID VALVE MR Mean grad:    69.0 mmHg    TR Peak grad:   27.9 mmHg MR Vmax:         495.00 cm/s  TR Vmax:        264.00 cm/s MR Vmean:        397.0 cm/s MR PISA:         0.57 cm     SHUNTS MR PISA Eff ROA: 5 mm        Systemic VTI:  0.28 m MR PISA Radius:  0.30 cm      Systemic Diam: 2.50 cm MV E velocity: 96.70 cm/s  Gypsy Balsam MD Electronically signed by Gypsy Balsam MD Signature Date/Time: 10/09/2021/5:15:03 PM    Final  Recent Labs: 06/14/2022: ALT 11; Hemoglobin 12.3; Platelets 202; TSH 1.400 07/31/2022: BUN 28; Creatinine, Ser 1.18; Magnesium 2.1; Potassium 4.8; Sodium 145  Recent Lipid Panel    Component Value Date/Time   CHOL 122 06/14/2022 1155   TRIG 85 06/14/2022 1155   HDL 45 06/14/2022 1155   CHOLHDL 2.7 06/14/2022 1155   LDLCALC 60 06/14/2022 1155    Physical Exam:    VS:  BP 110/60   Pulse 79   Ht 5\' 10"  (1.778 m)   Wt 176 lb 3.2 oz (79.9 kg)   SpO2 92%   BMI 25.28 kg/m     Wt Readings from Last 3 Encounters:  11/08/22 176 lb 3.2 oz (79.9 kg)  10/22/22 174 lb (78.9 kg)  09/13/22 178 lb 6 oz (80.9 kg)     GEN:  Well nourished, well developed in no acute distress HEENT: Normal NECK: No JVD; No carotid bruits LYMPHATICS: No lymphadenopathy CARDIAC: Irregular rhythm variable first heart sound 2-3 of 6 AR right sternal border to the apex  RESPIRATORY:  Clear to auscultation without rales, wheezing or rhonchi  ABDOMEN: Soft, non-tender, non-distended MUSCULOSKELETAL:  No edema; No deformity  SKIN: Warm and dry NEUROLOGIC:  Alert and oriented x 3 PSYCHIATRIC:  Normal affect    Signed, Norman Herrlich, MD  11/08/2022 3:57 PM    [facility]

## 2022-11-08 ENCOUNTER — Encounter: Payer: Self-pay | Admitting: Cardiology

## 2022-11-08 ENCOUNTER — Ambulatory Visit: Payer: PPO | Attending: Cardiology | Admitting: Cardiology

## 2022-11-08 VITALS — BP 110/60 | HR 79 | Ht 70.0 in | Wt 176.2 lb

## 2022-11-08 DIAGNOSIS — I4821 Permanent atrial fibrillation: Secondary | ICD-10-CM | POA: Diagnosis not present

## 2022-11-08 DIAGNOSIS — Z79899 Other long term (current) drug therapy: Secondary | ICD-10-CM | POA: Diagnosis not present

## 2022-11-08 DIAGNOSIS — I110 Hypertensive heart disease with heart failure: Secondary | ICD-10-CM

## 2022-11-08 DIAGNOSIS — M533 Sacrococcygeal disorders, not elsewhere classified: Secondary | ICD-10-CM | POA: Diagnosis not present

## 2022-11-08 DIAGNOSIS — I351 Nonrheumatic aortic (valve) insufficiency: Secondary | ICD-10-CM

## 2022-11-08 DIAGNOSIS — Z7901 Long term (current) use of anticoagulants: Secondary | ICD-10-CM | POA: Diagnosis not present

## 2022-11-08 DIAGNOSIS — I7121 Aneurysm of the ascending aorta, without rupture: Secondary | ICD-10-CM

## 2022-11-08 DIAGNOSIS — M4316 Spondylolisthesis, lumbar region: Secondary | ICD-10-CM | POA: Diagnosis not present

## 2022-11-08 NOTE — Patient Instructions (Signed)
Medication Instructions:  Your physician has recommended you make the following change in your medication:   STOP: Digoxin  *If you need a refill on your cardiac medications before your next appointment, please call your pharmacy*   Lab Work: Your physician recommends that you return for lab work in:   Labs today: BMP, Pro BNP, CBC  If you have labs (blood work) drawn today and your tests are completely normal, you will receive your results only by: MyChart Message (if you have MyChart) OR A paper copy in the mail If you have any lab test that is abnormal or we need to change your treatment, we will call you to review the results.   Testing/Procedures: None   Follow-Up: At [facility], you and your health needs are our priority.  As part of our continuing mission to provide you with exceptional heart care, we have created designated Provider Care Teams.  These Care Teams include your primary Cardiologist (physician) and Advanced Practice Providers (APPs -  Physician Assistants and Nurse Practitioners) who all work together to provide you with the care you need, when you need it.  We recommend signing up for the patient portal called "MyChart".  Sign up information is provided on this After Visit Summary.  MyChart is used to connect with patients for Virtual Visits (Telemedicine).  Patients are able to view lab/test results, encounter notes, upcoming appointments, etc.  Non-urgent messages can be sent to your provider as well.   To learn more about what you can do with MyChart, go to [email or web address].    Your next appointment:   3 month(s)  Provider:   Norman Herrlich, MD  Other Instructions None

## 2022-11-08 NOTE — Addendum Note (Signed)
Addended by: Roxanne Mins I on: 11/08/2022 04:09 PM   Modules accepted: Orders

## 2022-11-09 LAB — BASIC METABOLIC PANEL
BUN/Creatinine Ratio: 18 (ref 10–24)
BUN: 21 mg/dL (ref 8–27)
CO2: 27 mmol/L (ref 20–29)
Calcium: 9.7 mg/dL (ref 8.6–10.2)
Chloride: 100 mmol/L (ref 96–106)
Creatinine, Ser: 1.16 mg/dL (ref 0.76–1.27)
Glucose: 84 mg/dL (ref 70–99)
Potassium: 3.6 mmol/L (ref 3.5–5.2)
Sodium: 144 mmol/L (ref 134–144)
eGFR: 65 mL/min/{1.73_m2} (ref >59)

## 2022-11-09 LAB — CBC
Hematocrit: 37.8 % (ref 37.5–51.0)
Hemoglobin: 12.2 g/dL — ABNORMAL LOW (ref 13.0–17.7)
MCH: 32.1 pg (ref 26.6–33.0)
MCHC: 32.3 g/dL (ref 31.5–35.7)
MCV: 100 fL — ABNORMAL HIGH (ref 79–97)
Platelets: 249 10*3/uL (ref 150–450)
RBC: 3.8 x10E6/uL — ABNORMAL LOW (ref 4.14–5.80)
RDW: 13.7 % (ref 11.6–15.4)
WBC: 9.4 10*3/uL (ref 3.4–10.8)

## 2022-11-09 LAB — PRO B NATRIURETIC PEPTIDE: NT-Pro BNP: 1382 pg/mL — ABNORMAL HIGH (ref 0–486)

## 2022-11-10 DIAGNOSIS — G4733 Obstructive sleep apnea (adult) (pediatric): Secondary | ICD-10-CM | POA: Diagnosis not present

## 2022-11-10 NOTE — Procedures (Signed)
Patient Name: Aaron Richmond, Aaron Richmond Date: 10/22/2022 Gender: Male D.O.B: March 11, 1945 Age (years): 23 Referring Provider: Ames Dura NP Height (inches): 70 Interpreting Physician: Jetty Duhamel MD, ABSM Weight (lbs): 174 RPSGT: Shelah Lewandowsky BMI: 25 MRN: [id number] Neck Size: 17.75  CLINICAL INFORMATION The patient is referred for a split night study with BPAP. MEDICATIONS Medications self-administered by patient taken the night of the study : Sound Sleep by Woody Seller  SLEEP STUDY TECHNIQUE As per the AASM Manual for the Scoring of Sleep and Associated Events v2.3 (April 2016) with a hypopnea requiring 4% desaturations.  The channels recorded and monitored were frontal, central and occipital EEG, electrooculogram (EOG), submentalis EMG (chin), nasal and oral airflow, thoracic and abdominal wall motion, anterior tibialis EMG, snore microphone, electrocardiogram, and pulse oximetry. Bi-level positive airway pressure (BiPAP) was initiated when the patient met split night criteria and was titrated according to treat sleep-disordered breathing.  RESPIRATORY PARAMETERS Diagnostic  Total AHI (/hr): 59.8 RDI (/hr): 60.2 OA Index (/hr): 1 CA Index (/hr): 5.5 REM AHI (/hr): 53.3 NREM AHI (/hr): 60.3 Supine AHI (/hr): 59.0 Non-supine AHI (/hr): 60.66 Min O2 Sat (%): 75.0 Mean O2 (%): 87.8 Time below 88% (min): 62   Titration  Optimal IPAP Pressure (cm): 19 Optimal EPAP Pressure (cm): 15 AHI at Optimal Pressure (/hr): 13 Min O2 at Optimal Pressure (%): 88.0 Sleep % at Optimal (%): 99 Supine % at Optimal (%): 0   SLEEP ARCHITECTURE The study was initiated at 9:46:28 PM and terminated at 4:48:41 AM. The total recorded time was 422.2 minutes. EEG confirmed total sleep time was 227.5 minutes yielding a sleep efficiency of 53.9%. Sleep onset after lights out was 18.9 minutes with a REM latency of 85.5 minutes. The patient spent 49.9% of the night in stage N1 sleep, 34.9% in stage N2 sleep,  0.0% in stage N3 and 15.2% in REM. Wake after sleep onset (WASO) was 175.8 minutes. The Arousal Index was 56.4/hour.  LEG MOVEMENT DATA The total Periodic Limb Movements of Sleep (PLMS) were 0. The PLMS index was 0.0 .  CARDIAC DATA The 2 lead EKG demonstrated atrial fibrillation. The mean heart rate was 100.0 beats per minute. Other EKG findings include: PVCs.  IMPRESSIONS - Severe obstructive sleep apnea occurred during the diagnostic portion of the study (AHI = 59.8 /hour). - Obstructive apneas were controlled by CPAP 15, however persistent central apneas led change to BIPAP titration. - An optimal BiPAP pressure was selected for this patient ( 19 / 15 cm of water). - Mild central sleep apnea occurred during the diagnostic portion of the study (CAI = 5.5/hour). - Moderate oxygen desaturation was noted during the diagnostic portion of the study (Min O2 = 75.0%). On BIPAP 19/15, minimum O2 saturation was 88%, Mean 92.6%. - The patient snored with moderate snoring volume during the diagnostic portion of the study. - EKG findings include PVCs. - Clinically significant periodic limb movements of sleep did not occur during the study. - Sleep talking noted. - Sleep remained fragmented throughout with frequent breif arousals and awakening.   DIAGNOSIS - Obstructive Sleep Apnea (G47.33)  RECOMMENDATIONS - Trial of BiPAP therapy on 19/15 cm H2O. - Patient used a Large size Fisher&Paykel Full Face Simplus mask and heated humidification. - Consider if help for insomnia is also needed. - Sleep hygiene should be reviewed to assess factors that may improve sleep quality. - Weight management and regular exercise should be initiated or continued.  [Electronically signed] 11/10/2022 10:44 AM  Joni Fears Maple Hudson  MD, ABSM Diplomate, American Board of Sleep Medicine NPI: [id number]                         Jetty Duhamel Diplomate, American Board of Sleep Medicine  ELECTRONICALLY  SIGNED ON:  11/10/2022, 10:34 AM [facility] PH: [phone]   FX: [phone] ACCREDITED BY THE AMERICAN ACADEMY OF SLEEP MEDICINE

## 2022-11-11 ENCOUNTER — Ambulatory Visit
Admission: RE | Admit: 2022-11-11 | Discharge: 2022-11-11 | Disposition: A | Discharge status: Home / Self Care | Payer: PPO | Source: Ambulatory Visit | Attending: Radiation Oncology | Admitting: Radiation Oncology

## 2022-11-11 ENCOUNTER — Other Ambulatory Visit: Payer: Self-pay

## 2022-11-11 ENCOUNTER — Ambulatory Visit: Payer: PPO | Admitting: Internal Medicine

## 2022-11-11 VITALS — BP 115/43 | HR 58 | Temp 97.7°F | Resp 16 | Ht 70.0 in | Wt 175.6 lb

## 2022-11-11 DIAGNOSIS — C61 Malignant neoplasm of prostate: Secondary | ICD-10-CM | POA: Diagnosis not present

## 2022-11-11 DIAGNOSIS — Z23 Encounter for immunization: Secondary | ICD-10-CM

## 2022-11-11 DIAGNOSIS — C7951 Secondary malignant neoplasm of bone: Secondary | ICD-10-CM | POA: Diagnosis not present

## 2022-11-11 NOTE — Progress Notes (Signed)
Nurse visit  Flu shot

## 2022-11-11 NOTE — Progress Notes (Signed)
Radiation Oncology         [phone] ________________________________  Name: Aaron Richmond        MRN: [id number]  Date of Service: 11/11/2022 DOB: 06/13/1945  CC:Van Domenic Schwab, MD  Crist Fat, MD     REFERRING PHYSICIAN: Leonia Reader, Barbara Cower, MD   DIAGNOSIS: Gleason 4+5 = 9 adenocarcinoma the prostate, oligometastatic   HISTORY OF PRESENT ILLNESS: Aaron Richmond is a 77 y.o. male seen today regarding his diagnosis of oligometastatic adenocarcinoma of the prostate.  He completed external beam radiation in our department early last year.  He has continued to be followed by Dr. Alvester Morin.  He was previously on Xtandi, but discontinued it secondary to side effects.  Earlier this year, he underwent kyphoplasty for a lesion involving his L1 vertebral body.  He continues to be bothered by significant lower back pain, and is scheduled to see a pain management specialist in approximately 2 weeks.  He reports that he is also entertaining the idea of back surgery  PSMA PET scan was obtained on 11/06/2022; and revealed decreased radiotracer uptake in the prostate gland.  No notable or visceral metastasis was seen.  His previous sclerotic lesion in the L1 vertebral body had been replaced with bone cement.  A lesion in the right ischium head almost completely resolved, with no residual uptake.  Subtle sclerotic lesions were seen in the left sixth and ninth ribs, with mild FDG uptake.   PREVIOUS RADIATION THERAPY: Yes    PAST MEDICAL HISTORY:  Past Medical History:  Diagnosis Date   Aneurysm of aortic root    Overview:  Last Assessment & Plan:  Planning to see Dr Tyrone Sage for evaluation of 5cm aneurysm.  - will check PFT in prep for possible procedure / SGY   Aortic atherosclerosis (HCC) 06/14/2022   Aortic regurgitation    Aortic valve insufficiency    Ascending aortic aneurysm (HCC) 09/11/2016   Atherosclerotic vascular disease 05/27/2022   Atrial fibrillation (HCC)    Atrial fibrillation  with RVR (HCC) 07/05/2022   BMI 27.0-27.9,adult 06/14/2022   BPH (benign prostatic hyperplasia)    Cardiomyopathy (HCC) 09/11/2016   CKD (chronic kidney disease) stage 2, GFR 60-89 ml/min 11/23/2021   Coronary artery calcification seen on CT scan 10/13/2017   Essential hypertension 06/14/2022   High risk medication use 07/31/2022   History of cardiomyopathy 11/09/2021   History of pulmonary embolism 09/24/2012   Overview:  Last Assessment & Plan:  Hx PE x 2 per notes, ? 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  - needs auto-titration study by [employer] or local company, data to RB to adjust his home device.    Prediabetes    Preoperative clearance 10/13/2017   Primary osteoarthritis involving multiple joints 06/14/2022   Prostate cancer metastatic to bone (HCC) 06/14/2022   Pulmonary embolism (HCC)    Rhinitis    Seasonal allergies 06/14/2022   Spondylolisthesis of lumbar  region 12/12/2021   Stage 3a chronic kidney disease (HCC) 06/14/2022       PAST SURGICAL HISTORY: Past Surgical History:  Procedure Laterality Date   APPENDECTOMY     HERNIA REPAIR     LUMBAR LAMINECTOMY  02/06/2022   Facetectom & foraminotomy for decompression of the cauda equina & nerve root L4-5, Addison Bailey, MD   TONSILLECTOMY       FAMILY HISTORY:  Family History  Problem Relation Age of Onset   Stroke Mother    Alzheimer's  disease Mother    Heart attack Mother    Stroke Father    Diabetes Mellitus II Father      SOCIAL HISTORY:  reports that he has never smoked. He has never used smokeless tobacco. He reports that he does not drink alcohol and does not use drugs.   ALLERGIES: Ciprofloxacin   MEDICATIONS:  Current Outpatient Medications  Medication Sig Dispense Refill   traMADol (ULTRAM) 50 MG tablet Take 50 mg by mouth 2 (two) times daily as needed.     acetaminophen (TYLENOL) 500 MG tablet Take 500 mg by mouth every 6 (six) hours as needed for mild pain or moderate pain.     dapagliflozin propanediol (FARXIGA) 5 MG TABS tablet Take 1 tablet (5 mg total) by mouth daily. 90 tablet 1   KLOR-CON M20 20 MEQ tablet Take 1 tablet (20 mEq total) by mouth daily. 90 tablet 3   metoprolol tartrate (LOPRESSOR) 50 MG tablet Take 1.5 tablets (75 mg total) by mouth 2 (two) times daily. 180 tablet 1   Multiple Vitamin (MULTIVITAMINS PO) Take 1 tablet by mouth daily. Unknown strength     rivaroxaban (XARELTO) 20 MG TABS tablet TAKE 1 TABLET BY MOUTH ONCE DAILY WITH SUPPER (Patient taking differently: Take 20 mg by mouth daily with supper.) 90 tablet 1   rosuvastatin (CRESTOR) 5 MG tablet Take 1 tablet (5 mg total) by mouth daily. 90 tablet 2   sacubitril-valsartan (ENTRESTO) 24-26 MG Take 1 tablet by mouth twice daily 60 tablet 3   tamsulosin (FLOMAX) 0.4 MG CAPS capsule TAKE 1 CAPSULE BY MOUTH TWICE DAILY 30 MINUTES FOLLOWING THE SAME MEAL EACH DAY (Patient taking differently: Take 0.4 mg by mouth 2 (two) times daily.) 180 capsule 0   torsemide (DEMADEX) 20 MG tablet Take one tab po BID except on M/W/F take 2 tabs po in AM and 1 tab po in PM (Patient taking differently: Take 20 mg by mouth 2 (two) times daily. Take one tab po BID except on M/W/F take 2 tabs po in AM and 1 tab po in PM) 216 tablet 3   UNABLE TO FIND Take 2 tablets by mouth daily. Med Name: Curamin/ Unknown strength     No current facility-administered  medications for this encounter.     REVIEW OF SYSTEMS:      PHYSICAL EXAM:  Wt Readings from Last 3 Encounters:  11/11/22 175 lb 9.6 oz (79.7 kg)  11/08/22 176 lb 3.2 oz (79.9 kg)  10/22/22 174 lb (78.9 kg)   Temp Readings from Last 3 Encounters:  11/11/22 97.7 F (36.5 C) (Oral)  09/13/22 97.8 F (36.6 C)  08/01/22 98 F (36.7 C) (Temporal)   BP Readings from Last 3 Encounters:  11/11/22 (!) 115/43  11/08/22 110/60  09/13/22 (!) 122/52   Pulse Readings from Last 3 Encounters:  11/11/22 (!) 58  11/08/22 79  09/13/22 64   Pain Assessment Pain Score: 5  Pain Loc: Back/10  ECOG = 1  0 - Asymptomatic (Fully active, able to carry on all predisease activities without restriction)  1 - Symptomatic but completely ambulatory (Restricted in physically strenuous activity but ambulatory and able to carry out work of a light or sedentary nature. For example, light housework, office work)  2 - Symptomatic, <50% in bed during the day (Ambulatory and capable of all self care but unable to carry out any work activities. Up and about more than 50% of waking hours)  3 - Symptomatic, >50% in bed, but not bedbound (Capable of only limited self-care, confined to bed or chair 50% or more of waking hours)  4 - Bedbound (Completely disabled. Cannot carry on any self-care. Totally confined to bed or chair)  5 - Death   Santiago Glad MM, Creech RH, Tormey DC, et al. [phone]). "Toxicity and response criteria of the [facility]". Am. Evlyn Clines. Oncol. 5 (6): 649-55    LABORATORY DATA:  Lab Results  Component Value Date   WBC 9.4 11/08/2022   HGB 12.2 (L) 11/08/2022   HCT 37.8 11/08/2022   MCV 100 (H) 11/08/2022   PLT 249 11/08/2022   Lab Results  Component Value Date   NA 144 11/08/2022   K 3.6 11/08/2022   CL 100 11/08/2022   CO2 27 11/08/2022   Lab Results  Component Value Date   ALT 11 06/14/2022   AST 16 06/14/2022   ALKPHOS 65 06/14/2022   BILITOT  0.5 06/14/2022      RADIOGRAPHY: NM PET (PSMA) SKULL TO MID THIGH  Result Date: 11/11/2022 CLINICAL DATA:  Prostate cancer, restaging EXAM: NUCLEAR MEDICINE PET SKULL BASE TO THIGH TECHNIQUE: 8.3 mCi F18 POSLUMA was injected intravenously. Full-ring PET imaging was performed from the skull base to thigh after the radiotracer. CT data was obtained and used for attenuation correction and anatomic localization. COMPARISON:  08/15/2020 FINDINGS: NECK No radiotracer activity in neck lymph nodes. Incidental CT finding: None. CHEST No radiotracer accumulation within mediastinal or hilar lymph nodes. No suspicious pulmonary nodules on the CT scan. Incidental CT finding: Mild cardiac enlargement. Aortic atherosclerosis and coronary artery calcifications. The ascending thoracic aorta measures 4.6 cm, image 72/4. Previously this measured the same. ABDOMEN/PELVIS Prostate: Decreased tracer uptake associated with the prostate gland with SUV max of 3.36, image 175. Previously tracer uptake within the prostate had an SUV max of 4.1. Lymph nodes: No abnormal radiotracer accumulation within pelvic or abdominal nodes. Liver: No evidence of liver metastasis. Incidental CT finding: Aortic atherosclerosis. SKELETON: SKELETON -Compression fracture involving the previously sclerotic lesion involving the L1 vertebral body is identified which has been treated with bone cement. No abnormal tracer uptake identified within this vertebra at this time. -Previous sclerotic lesion involving the right ischium has nearly completely resolved without increased uptake on today's study, image 184/4. -Stable densely sclerotic lesion in the right iliac bone without corresponding increased uptake measuring 7 mm, image 139/4. There is a subtle sclerotic lesion involving the anterior aspect of the left sixth rib with mild FDG uptake (SUV max 2.67), image 92/4. This appears new when compared with the CT from 05/27/2022. Subtle area of sclerosis  involving the anterior aspect of the left ninth rib has an SUV max of 2.48. This also appears new compared with 05/27/2022. IMPRESSION: 1. Decreased tracer uptake associated with the prostate gland. 2. No signs of tracer avid nodal metastasis or visceral metastasis. 3. Previous sclerotic lesion involving the L1 vertebral body has been treated with bone cement. No  abnormal tracer uptake identified within this vertebra at this time. 4. Previous sclerotic lesion involving the right ischium has nearly completely resolved without increased uptake on today's study. 5. Subtle sclerotic lesions involving the anterior aspect of the left sixth and ninth ribs with mild FDG uptake. These appear new when compared with the CT from 05/27/2022. These findings may reflect interval posttraumatic change. Correlate for any trauma to the left chest wall since 05/27/2022. In the absence of trauma, findings may reflect early bone metastases. 6. Stable ascending thoracic aortic aneurysm. Recommend semi-annual imaging followup by CTA or MRA and referral to cardiothoracic surgery if not already obtained. This recommendation follows 2010 ACCF/AHA/AATS/ACR/ASA/SCA/SCAI/SIR/STS/SVM Guidelines for the Diagnosis and Management of Patients With Thoracic Aortic Disease. Circulation. 2010; 121: Z610-R604. Aortic aneurysm NOS (ICD10-I71.9) 7.  Aortic Atherosclerosis (ICD10-I70.0). Electronically Signed   By: Signa Kell M.D.   On: 11/11/2022 11:20   Sleep Study Documents  Result Date: 10/24/2022 Ordered by an unspecified provider.  Sleep Study Documents  Result Date: 10/24/2022 Ordered by an unspecified provider.      IMPRESSION/PLAN: 1.   In a visit lasting 25 minutes, greater than 50% of the time was spent face to face discussing the patient's condition, in preparation for the discussion, and coordinating the patient's care.    Bardia Wangerin A. Thersa Salt, MD   **Disclaimer: This note was dictated with voice recognition software.  Similar sounding words can inadvertently be transcribed and this note may contain transcription errors which may not have been corrected upon publication of note.**

## 2022-11-22 ENCOUNTER — Other Ambulatory Visit: Payer: Self-pay | Admitting: Internal Medicine

## 2022-11-27 DIAGNOSIS — Z1389 Encounter for screening for other disorder: Secondary | ICD-10-CM | POA: Diagnosis not present

## 2022-11-27 DIAGNOSIS — M549 Dorsalgia, unspecified: Secondary | ICD-10-CM | POA: Diagnosis not present

## 2022-11-27 DIAGNOSIS — Z79899 Other long term (current) drug therapy: Secondary | ICD-10-CM | POA: Diagnosis not present

## 2022-11-27 DIAGNOSIS — M4310 Spondylolisthesis, site unspecified: Secondary | ICD-10-CM | POA: Diagnosis not present

## 2022-11-27 DIAGNOSIS — M479 Spondylosis, unspecified: Secondary | ICD-10-CM | POA: Diagnosis not present

## 2022-11-27 DIAGNOSIS — G894 Chronic pain syndrome: Secondary | ICD-10-CM | POA: Diagnosis not present

## 2022-11-27 DIAGNOSIS — M169 Osteoarthritis of hip, unspecified: Secondary | ICD-10-CM | POA: Diagnosis not present

## 2022-11-27 DIAGNOSIS — M461 Sacroiliitis, not elsewhere classified: Secondary | ICD-10-CM | POA: Diagnosis not present

## 2022-11-30 ENCOUNTER — Other Ambulatory Visit: Payer: Self-pay | Admitting: Internal Medicine

## 2022-12-02 ENCOUNTER — Ambulatory Visit: Payer: PPO | Admitting: Cardiothoracic Surgery

## 2022-12-02 ENCOUNTER — Ambulatory Visit
Admission: RE | Admit: 2022-12-02 | Discharge: 2022-12-02 | Disposition: A | Discharge status: Home / Self Care | Payer: PPO | Source: Ambulatory Visit | Attending: Cardiothoracic Surgery | Admitting: Cardiothoracic Surgery

## 2022-12-02 DIAGNOSIS — I7121 Aneurysm of the ascending aorta, without rupture: Secondary | ICD-10-CM

## 2022-12-02 DIAGNOSIS — I700 Atherosclerosis of aorta: Secondary | ICD-10-CM | POA: Diagnosis not present

## 2022-12-02 DIAGNOSIS — I2510 Atherosclerotic heart disease of native coronary artery without angina pectoris: Secondary | ICD-10-CM | POA: Diagnosis not present

## 2022-12-08 DIAGNOSIS — G4733 Obstructive sleep apnea (adult) (pediatric): Secondary | ICD-10-CM

## 2022-12-09 ENCOUNTER — Ambulatory Visit: Payer: PPO | Admitting: Cardiothoracic Surgery

## 2022-12-09 NOTE — Telephone Encounter (Signed)
Amy can you please let patient know split night sleep study showed severe sleep apnea, apneas were controlled on CPAP however he had some breakthrough central apneas and changed to BIPAP.   Please place order for   - BiPAP therapy on 19/15 cm H2O. - Patient used a Large size Fisher&Paykel Full Face Simplus mask and heated humidification  Needs follow-up inn 6-8 weeks for compliance check

## 2022-12-09 NOTE — Telephone Encounter (Signed)
Aaron Richmond, The patient's Split Night Sleep Study results are available and patient is requesting his results.  Please advise.  Thank you.

## 2022-12-11 ENCOUNTER — Encounter: Payer: Self-pay | Admitting: Internal Medicine

## 2022-12-11 ENCOUNTER — Ambulatory Visit: Payer: PPO | Admitting: Internal Medicine

## 2022-12-11 VITALS — BP 120/60 | HR 97 | Temp 98.0°F | Resp 20 | Ht 70.0 in | Wt 175.5 lb

## 2022-12-11 DIAGNOSIS — I10 Essential (primary) hypertension: Secondary | ICD-10-CM | POA: Diagnosis not present

## 2022-12-11 DIAGNOSIS — N182 Chronic kidney disease, stage 2 (mild): Secondary | ICD-10-CM | POA: Diagnosis not present

## 2022-12-11 DIAGNOSIS — I480 Paroxysmal atrial fibrillation: Secondary | ICD-10-CM | POA: Diagnosis not present

## 2022-12-11 DIAGNOSIS — R7303 Prediabetes: Secondary | ICD-10-CM | POA: Diagnosis not present

## 2022-12-11 NOTE — Progress Notes (Signed)
Office Visit  Subjective   Patient ID: Aaron Richmond   DOB: 11/30/1945   Age: 77 y.o.   MRN: [id number]   Chief Complaint Chief Complaint  Patient presents with   Follow-up    Pt. Is here for follow up, hx of back pain     History of Present Illness Aaron Richmond returns for followup of his OSA.  He did see pulmonary on 08/01/2022 where they did set him up to have a sleep study which was done on 10/22/2022.  Pulmonary has been trying to get him setup for a new CPAP.  He contacted them a few days ago and is waiting on them to call him back.   He does have a history of OSA which was diagnosed around 2010.  He remains on his old CPAP at night.  Otherwise he denies any problems.    The patient also returns today for followup of his Atrial Fib. He was admitted to [facility] from 07/05/2022 until 07/09/2022 for A Fib with RVR and CHF exacerbation.  I saw him in the office on 07/05/2022 where he was having symptomatic A. Fib with RVR with a HR of 130's in the office.  He was diagnosed last year with A. Fib where he was admitted for several days where it was difficult to control his HR.  They stopped him off diltiazem when he was placed on entresto.  I spoke to cardiology and they felt it was best for him to be sent to the ER for workup where he was seen in the ER on 07/05/2022.  They did lab workup and his initial CXR showed some vascular congestion.  They placed him on a cardizem drip where he had improvement with his HR.  They uptitrated his metoprolol.  The patient was seen by cardiology and they started him on digoxin.  They diuresed him with iv lasix where his dyspnea and hypoxia improved.  He has seen Dr. Dulce Sellar back in 08/2022 and they adjusted his torsemide as above as he has some edema at that time. He did see Dr. Dulce Sellar in followup on 11/08/2022.  The patient was complaining of loss of appetite and loss of taste and Dr. Dulce Sellar stopped him off of digoxin.  He felt his CHF was compensated and wanted him to  continue on his current meds.  Again he had an ECHO done when he was hospitalized at [facility] which was done on 07/06/2022 and this showed a LV chamber size that was moderately dilated and LVEF of 40-45% with diffuse hypokinesia and severely dilated LA.  The RA was mildly dilated.  There was moderate MR and AR.  The ascending aorta is moderate to severely aneurysmal.  He was noted previously from an ECHO done on 08/07/2021 that he had a cardiomyopathy.  His ECHO showed that he had a mild to moderate decrease his LV function with a LVEF of 40-45%.  He has LV hypokinesis and a LV internal cavity size that is mildly dilated.  He has moderate concentric LVH and his left diastolic parameters were indeterminate.  His LA is severely dilated and he has moderate MR and AV.  Dr. Tomie China asked him to stop his losartan and started him on  entresto 24/26mg  BID this past year.  His BP has been stable at home.   He also asked him to stop his diltiazem since going on entresto as well.  They did do a repeat ECHO on 10/09/2021 and this showed an improvement in  his EF where his EF went up to 50-55% and low normal systolic function.  There was no regional wall motion abnormalities. The left ventricular internal cavity size was mildly to moderately dilated. His Left ventricular diastolic parameters are indeterminate.  His right ventricular systolic function is normal. The right ventricular  size is normal. There is normal pulmonary artery systolic pressure. His left atrial size was severely dilated. He mild to moderate mitral valve regurgitation.  He has an aneurysm of the ascending aorta, measuring 51 mm. The patient did followup with Cardiology on 11/09/2021 and recommended continued management and he was stable at that time. Again, the patient was admitted to [facility] from 06/03/2021 until 06/07/2021 with A. Fib with RVR.  He states that he woke up after a nap and began having severe generalized weakness where he could not use his legs.   The patient was brought in to the ER where his wife states his HR was in the 190's and they discovered he was in A. Fib with RVR.  He has a known history of A. Fib where he is followed regularly by myself and cardiology.  While in the hospital, they did place him on a Cardizem drip.  He did not have a repeat ECHO.  They did perform a CTA of his chest which showed no evidence of pulmonary embolism and he had a stable 4.9 AAA that was unchanged.  His heart rate was controlled and they increase his metoprolol from 25mg  BID to 50mg  BID and started him on Cardizem 60mg  TID which did control his heart rate.  Again, cardiology performed a stress ECHO in Oct 2016 which was normal.  They did a ECHO in 02/2017 which showed moderate LVH with a normal LVEF 50-55% without wall motion abnormalities.  He had a repeat ECHO in 09/04/2019 and this showed an LVEF 50-55% with his LV cavity size severely dilated and his LA was severely dilated and there was mild-moderate MVR.  He had AV insuffiency that was moderate and a LV dilatation where they think he may be coming soon to the point of surgery.   Anticoagulation status: He remains on lifelong xarelto therapy per cardiology. CHADVASC2 score is 3.  He denies any bruising, melena, BRBPR or other bleeding problems.   The patient is a 77 year old Caucasian/White male who presents for a follow-up evaluation of hypertension.  Since his last visit, he states he has not had any problems.   The patient has not been checking his blood pressure at home.  The patient's current medications include:  metoprolol tartrate 75 mg BID and entresto 24/26mg  BID and torsemide 20mg  BID except on M/W/F he takes 40mg  in AM and 20mg  in the evening. The patient has been tolerating his medications well. The patient denies any headache, visual changes, dizziness, lightheadness, chest pain, shortness of breath, weakness/numbness, and edema.  He reports there have been no other symptoms noted.   He does have a  history of Stage II-III CKD which has been going on for the last 6-7 years.  He has been stable and his GFR has been running in the 60's with a creatinine baseline 1.2-1.3.  I started him on farxiga for renal protection in 2022 and he states he is not having any problems or side effects.  He is avoiding NSAIDS.    We also noted on his yearly labs that his HgBa1c was elevated at 6.2% showing prediabetes.  We asked him at that time to watch his  diet and continue to be active.  He is not checking his blood sugars at home.  He has no abdominal pain, nausea or vomiting.  His last HgBA1c was done 6 months ago and was 6.2%.      Past Medical History Past Medical History:  Diagnosis Date   Aneurysm of aortic root    Overview:  Last Assessment & Plan:  Planning to see Dr Tyrone Sage for evaluation of 5cm aneurysm.  - will check PFT in prep for possible procedure / SGY   Aortic atherosclerosis (HCC) 06/14/2022   Aortic regurgitation    Aortic valve insufficiency    Ascending aortic aneurysm (HCC) 09/11/2016   Atherosclerotic vascular disease 05/27/2022   Atrial fibrillation (HCC)    Atrial fibrillation with RVR (HCC) 07/05/2022   BMI 27.0-27.9,adult 06/14/2022   BPH (benign prostatic hyperplasia)    Cardiomyopathy (HCC) 09/11/2016   CKD (chronic kidney disease) stage 2, GFR 60-89 ml/min 11/23/2021   Coronary artery calcification seen on CT scan 10/13/2017   Essential hypertension 06/14/2022   High risk medication use 07/31/2022   History of cardiomyopathy 11/09/2021   History of pulmonary embolism 09/24/2012   Overview:  Last Assessment & Plan:  Hx PE x 2 per notes, ? 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  - needs auto-titration study by [employer] or local company, data to RB to adjust his home device.    Prediabetes    Preoperative clearance 10/13/2017   Primary osteoarthritis involving multiple joints 06/14/2022   Prostate cancer metastatic to bone (HCC) 06/14/2022   Pulmonary embolism (HCC)    Rhinitis    Seasonal allergies 06/14/2022   Spondylolisthesis of lumbar region 12/12/2021   Stage 3a chronic kidney disease (HCC) 06/14/2022     Allergies Allergies  Allergen Reactions   Ciprofloxacin     Contraindicated because of increased risk of rupture of ascending thoracic aortic aneurysm     Medications  Current Outpatient Medications:    HYDROcodone-acetaminophen (NORCO/VICODIN) 5-325 MG tablet, Take 1 tablet by mouth 3 (three) times daily., Disp: , Rfl:    acetaminophen (TYLENOL) 500 MG tablet, Take 500 mg by mouth every 6 (six) hours as needed for mild pain or moderate pain., Disp: , Rfl:    FARXIGA 5 MG TABS tablet, Take 1 tablet by mouth once daily, Disp: 90 tablet, Rfl: 0   KLOR-CON M20 20 MEQ tablet, Take 1 tablet (20 mEq total) by mouth daily., Disp: 90 tablet, Rfl: 3   metoprolol tartrate (LOPRESSOR) 50 MG tablet, TAKE 1 & 1/2 (ONE & ONE-HALF) TABLETS BY MOUTH TWICE DAILY, Disp: 180 tablet, Rfl: 0   Multiple Vitamin (MULTIVITAMINS PO), Take 1 tablet by mouth daily. Unknown strength, Disp: , Rfl:    rivaroxaban (XARELTO) 20 MG TABS tablet, TAKE 1 TABLET BY MOUTH ONCE DAILY WITH SUPPER (Patient taking differently: Take 20 mg by mouth daily with supper.), Disp: 90 tablet, Rfl: 1   rosuvastatin (CRESTOR) 5  MG tablet, Take 1 tablet (5 mg total) by mouth daily., Disp: 90 tablet, Rfl: 2   sacubitril-valsartan  (ENTRESTO) 24-26 MG, Take 1 tablet by mouth twice daily, Disp: 60 tablet, Rfl: 3   tamsulosin (FLOMAX) 0.4 MG CAPS capsule, TAKE 1 CAPSULE BY MOUTH TWICE DAILY 30 MINUTES FOLLOWING THE SAME MEAL EACH DAY (Patient taking differently: Take 0.4 mg by mouth 2 (two) times daily.), Disp: 180 capsule, Rfl: 0   torsemide (DEMADEX) 20 MG tablet, Take one tab po BID except on M/W/F take 2 tabs po in AM and 1 tab po in PM (Patient taking differently: Take 20 mg by mouth 2 (two) times daily. Take one tab po BID except on M/W/F take 2 tabs po in AM and 1 tab po in PM), Disp: 216 tablet, Rfl: 3   UNABLE TO FIND, Take 2 tablets by mouth daily. Med Name: Curamin/ Unknown strength, Disp: , Rfl:    Review of Systems Review of Systems  Constitutional:  Negative for chills and fever.  Eyes:  Negative for blurred vision and double vision.  Respiratory:  Negative for cough, hemoptysis, shortness of breath and wheezing.   Cardiovascular:  Negative for chest pain, palpitations and leg swelling.  Gastrointestinal:  Negative for abdominal pain, blood in stool, constipation, diarrhea, melena, nausea and vomiting.  Genitourinary:  Positive for frequency. Negative for hematuria.  Musculoskeletal:  Negative for myalgias.  Skin:  Negative for itching and rash.  Neurological:  Negative for dizziness, weakness and headaches.  Endo/Heme/Allergies:  Negative for polydipsia.       Objective:    Vitals BP 120/60 (BP Location: Right Arm, Patient Position: Sitting)   Pulse 97   Temp 98 F (36.7 C) (Temporal)   Resp 20   Ht 5\' 10"  (1.778 m)   Wt 175 lb 8 oz (79.6 kg)   SpO2 98%   BMI 25.18 kg/m    Physical Examination Physical Exam Constitutional:      Appearance: Normal appearance. He is not ill-appearing.  Cardiovascular:     Rate and Rhythm: Normal rate and regular rhythm.     Pulses: Normal pulses.     Heart sounds: No murmur heard.    No friction rub. No gallop.  Pulmonary:     Effort: Pulmonary effort is  normal. No respiratory distress.     Breath sounds: No wheezing, rhonchi or rales.  Abdominal:     General: Abdomen is flat. Bowel sounds are normal. There is no distension.     Palpations: Abdomen is soft.     Tenderness: There is no abdominal tenderness.  Musculoskeletal:     Right lower leg: No edema.     Left lower leg: No edema.  Skin:    General: Skin is warm and dry.     Findings: No rash.  Neurological:     General: No focal deficit present.     Mental Status: He is alert and oriented to person, place, and time.  Psychiatric:        Mood and Affect: Mood normal.        Behavior: Behavior normal.        Assessment & Plan:   Essential hypertension He is on entresto and his BP has been stable.  Dr. Dulce Sellar has checked his BMP last month.  His BP is controlled.  Atrial fibrillation The patient seems to be rate controlled.  He will remain on metoprolol at this time.  He remains on anticoagulation.  CKD (chronic kidney disease)  stage 2, GFR 60-89 ml/min He remains on an ARB and farxiga at this time.  He is to avoid NSAIDS.  Prediabetes We will check his HgBA1c at this time.    Return in about 3 months (around 03/11/2023).   Crist Fat, MD

## 2022-12-11 NOTE — Assessment & Plan Note (Signed)
We will check his HgBA1c at this time.

## 2022-12-11 NOTE — Assessment & Plan Note (Signed)
The patient seems to be rate controlled.  He will remain on metoprolol at this time.  He remains on anticoagulation.

## 2022-12-11 NOTE — Assessment & Plan Note (Signed)
He is on entresto and his BP has been stable.  Dr. Dulce Sellar has checked his BMP last month.  His BP is controlled.

## 2022-12-11 NOTE — Assessment & Plan Note (Signed)
He remains on an ARB and farxiga at this time.  He is to avoid NSAIDS.

## 2022-12-12 LAB — HEMOGLOBIN A1C
Est. average glucose Bld gHb Est-mCnc: 126 mg/dL
Hgb A1c MFr Bld: 6 % — ABNORMAL HIGH (ref 4.8–5.6)

## 2022-12-19 ENCOUNTER — Other Ambulatory Visit: Payer: Self-pay | Admitting: Internal Medicine

## 2022-12-19 ENCOUNTER — Other Ambulatory Visit: Payer: Self-pay | Admitting: Cardiology

## 2022-12-19 DIAGNOSIS — I4819 Other persistent atrial fibrillation: Secondary | ICD-10-CM

## 2022-12-24 DIAGNOSIS — M47818 Spondylosis without myelopathy or radiculopathy, sacral and sacrococcygeal region: Secondary | ICD-10-CM | POA: Diagnosis not present

## 2022-12-30 NOTE — Progress Notes (Signed)
Tell him his diabetes is controlled  Patient aware

## 2023-01-05 ENCOUNTER — Other Ambulatory Visit: Payer: Self-pay | Admitting: Cardiology

## 2023-01-05 DIAGNOSIS — I429 Cardiomyopathy, unspecified: Secondary | ICD-10-CM

## 2023-01-07 DIAGNOSIS — Z79899 Other long term (current) drug therapy: Secondary | ICD-10-CM | POA: Diagnosis not present

## 2023-01-07 DIAGNOSIS — M461 Sacroiliitis, not elsewhere classified: Secondary | ICD-10-CM | POA: Diagnosis not present

## 2023-01-07 DIAGNOSIS — M479 Spondylosis, unspecified: Secondary | ICD-10-CM | POA: Diagnosis not present

## 2023-01-07 DIAGNOSIS — M549 Dorsalgia, unspecified: Secondary | ICD-10-CM | POA: Diagnosis not present

## 2023-01-07 DIAGNOSIS — G894 Chronic pain syndrome: Secondary | ICD-10-CM | POA: Diagnosis not present

## 2023-01-07 DIAGNOSIS — M169 Osteoarthritis of hip, unspecified: Secondary | ICD-10-CM | POA: Diagnosis not present

## 2023-01-07 DIAGNOSIS — Z1389 Encounter for screening for other disorder: Secondary | ICD-10-CM | POA: Diagnosis not present

## 2023-01-07 DIAGNOSIS — M4310 Spondylolisthesis, site unspecified: Secondary | ICD-10-CM | POA: Diagnosis not present

## 2023-01-13 IMAGING — PT NM PET TUM IMG SKULL BASE T - THIGH
1 series · 4 of 4 positions shown · non-contrast
Comparison: FDG PET scan 05/22/2020

CLINICAL DATA: Prostate cancer with bone metastasis

EXAM:
NUCLEAR MEDICINE PET SKULL BASE TO THIGH
TECHNIQUE: 9.49 mCi F18 Piflufolastat (Pylarify) was injected intravenously.
Full-ring PET imaging was performed from the skull base to thigh
after the radiotracer. CT data was obtained and used for attenuation
correction and anatomic localization.

[Series 8164: results mm oncology reading · 1.0mm · 1.02mm/px · 4 of 4 slices shown]
[im 1/4]
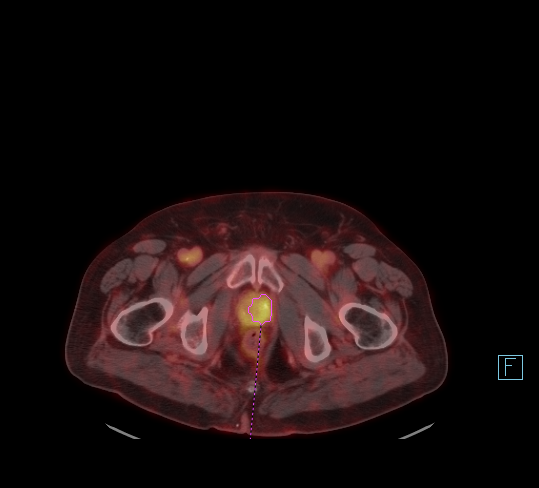
[im 2/4]
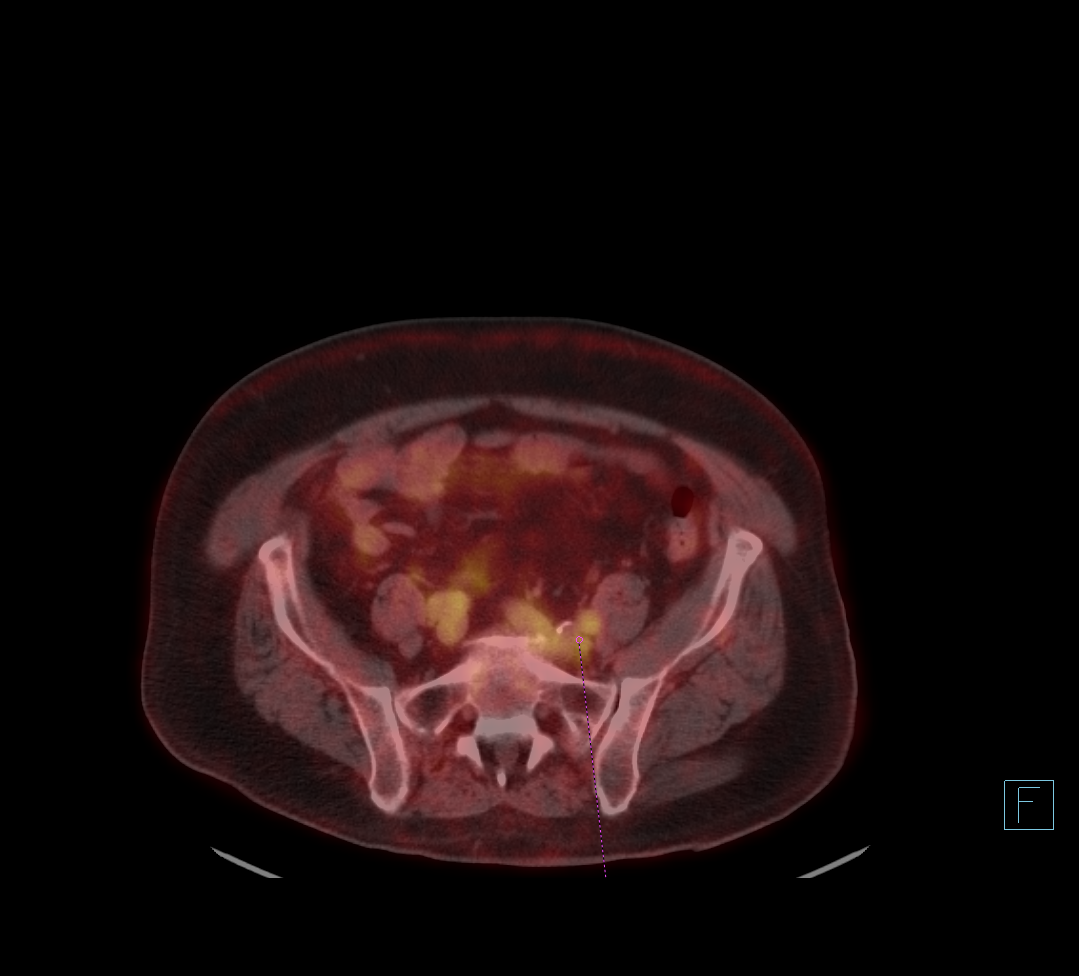
[im 3/4]
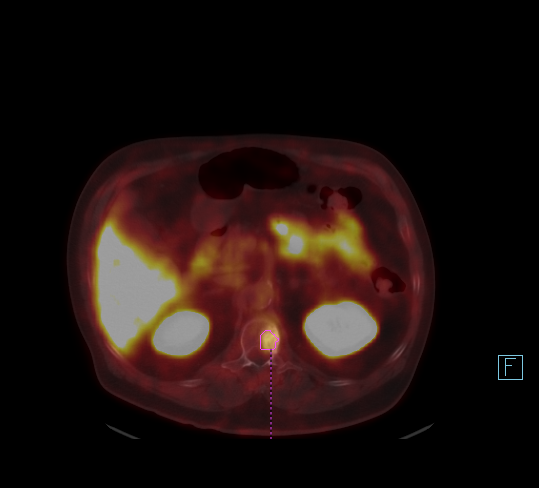
[im 4/4]
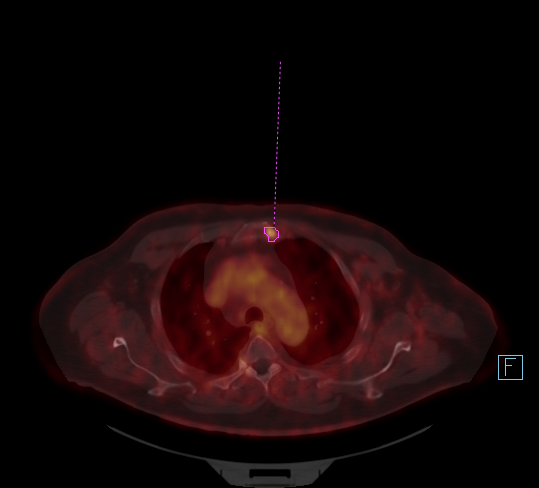

[4 of 4 positions shown; findings below may reference images not displayed]

FINDINGS: NECK

No radiotracer activity in neck lymph nodes.

Incidental CT finding: None

CHEST

No radiotracer accumulation within mediastinal or hilar lymph nodes.
No suspicious pulmonary nodules on the CT scan.

Incidental CT finding: None

ABDOMEN/PELVIS

Prostate: Mild radiotracer activity within the LEFT lateral
peripheral zone SUV max equal 4.9.

No radiotracer avid lymph nodes in the pelvis.

Previous described hypermetabolic lymph node on comparison FDG PET
scan is decreased in size measuring 5 mm (image 183/CT series 4)
compared to 12 mm. This lymph node has low radiotracer activity (SUV
max equal 2.4) similar background blood pool.

No radiotracer avid lymph nodes in the periaortic retroperitoneum

Lymph nodes: No abnormal radiotracer accumulation within pelvic or
abdominal nodes.

Liver: No evidence of liver metastasis

Incidental CT finding: None

SKELETON

There is a broad sclerotic lesion measuring 3.2 cm the L1 vertebral
body with mild radiotracer activity (SUV max equal 3.2) on image
142. Smaller sclerotic lesion in the RIGHT iliac bone measuring 10
mm image 175 does not have radiotracer activity.

Sclerotic lesion in the LEFT aspect of the manubrium measuring 7 mm
slightly more conspicuous than 5 mm on comparison PET scan and with
mild radiotracer activity.

Lesion within the inferior RIGHT pubic ramus is increased in size
measuring 26 mm (image 220) compared to 18 mm. No significant
radiotracer activity
IMPRESSION: 1. Radiotracer activity within the LEFT peripheral zone suggest
primary prostate carcinoma. No biopsy report dated provided.
2. Reduction in size of LEFT iliac lymph node compared to prior FDG
PET scan. No significant radiotracer activity. Indeterminate
finding.
3. Mild radiotracer activity associated sclerotic lesion at the L1
vertebral body is consistent with prostate cancer skeletal
metastasis. Second lesion within the manubrium with mild radiotracer
activity consistent with skeletal metastasis. Lesion inferior RIGHT
pubic ramus without significant radiotracer activity but increased
in size.
4. Overall the radiotracer activity is lower than typical PSMA PET
activity scan however favor multifocal skeletal disease as above.

## 2023-01-19 ENCOUNTER — Other Ambulatory Visit: Payer: Self-pay | Admitting: Internal Medicine

## 2023-01-21 ENCOUNTER — Other Ambulatory Visit: Payer: Self-pay

## 2023-01-21 DIAGNOSIS — M6281 Muscle weakness (generalized): Secondary | ICD-10-CM | POA: Diagnosis not present

## 2023-01-21 DIAGNOSIS — R2689 Other abnormalities of gait and mobility: Secondary | ICD-10-CM | POA: Diagnosis not present

## 2023-01-21 DIAGNOSIS — M79651 Pain in right thigh: Secondary | ICD-10-CM | POA: Diagnosis not present

## 2023-01-21 DIAGNOSIS — M2560 Stiffness of unspecified joint, not elsewhere classified: Secondary | ICD-10-CM | POA: Diagnosis not present

## 2023-01-21 DIAGNOSIS — M461 Sacroiliitis, not elsewhere classified: Secondary | ICD-10-CM | POA: Diagnosis not present

## 2023-01-21 DIAGNOSIS — C61 Malignant neoplasm of prostate: Secondary | ICD-10-CM

## 2023-01-21 DIAGNOSIS — R293 Abnormal posture: Secondary | ICD-10-CM | POA: Diagnosis not present

## 2023-01-21 DIAGNOSIS — R202 Paresthesia of skin: Secondary | ICD-10-CM | POA: Diagnosis not present

## 2023-01-24 ENCOUNTER — Other Ambulatory Visit: Payer: Self-pay | Admitting: Internal Medicine

## 2023-01-26 DIAGNOSIS — N401 Enlarged prostate with lower urinary tract symptoms: Secondary | ICD-10-CM | POA: Diagnosis not present

## 2023-01-26 DIAGNOSIS — M810 Age-related osteoporosis without current pathological fracture: Secondary | ICD-10-CM | POA: Diagnosis not present

## 2023-01-26 DIAGNOSIS — J439 Emphysema, unspecified: Secondary | ICD-10-CM | POA: Diagnosis not present

## 2023-01-26 DIAGNOSIS — R293 Abnormal posture: Secondary | ICD-10-CM | POA: Diagnosis not present

## 2023-01-26 DIAGNOSIS — R0602 Shortness of breath: Secondary | ICD-10-CM | POA: Diagnosis not present

## 2023-01-26 DIAGNOSIS — M6281 Muscle weakness (generalized): Secondary | ICD-10-CM | POA: Diagnosis not present

## 2023-01-26 DIAGNOSIS — I5022 Chronic systolic (congestive) heart failure: Secondary | ICD-10-CM | POA: Diagnosis not present

## 2023-01-26 DIAGNOSIS — I7121 Aneurysm of the ascending aorta, without rupture: Secondary | ICD-10-CM | POA: Diagnosis not present

## 2023-01-26 DIAGNOSIS — I4891 Unspecified atrial fibrillation: Secondary | ICD-10-CM | POA: Diagnosis not present

## 2023-01-26 DIAGNOSIS — M25572 Pain in left ankle and joints of left foot: Secondary | ICD-10-CM | POA: Diagnosis not present

## 2023-01-26 DIAGNOSIS — N400 Enlarged prostate without lower urinary tract symptoms: Secondary | ICD-10-CM | POA: Diagnosis not present

## 2023-01-26 DIAGNOSIS — W19XXXA Unspecified fall, initial encounter: Secondary | ICD-10-CM | POA: Diagnosis not present

## 2023-01-26 DIAGNOSIS — E7800 Pure hypercholesterolemia, unspecified: Secondary | ICD-10-CM | POA: Diagnosis not present

## 2023-01-26 DIAGNOSIS — K5900 Constipation, unspecified: Secondary | ICD-10-CM | POA: Diagnosis not present

## 2023-01-26 DIAGNOSIS — R42 Dizziness and giddiness: Secondary | ICD-10-CM | POA: Diagnosis not present

## 2023-01-26 DIAGNOSIS — E785 Hyperlipidemia, unspecified: Secondary | ICD-10-CM | POA: Diagnosis not present

## 2023-01-26 DIAGNOSIS — R2681 Unsteadiness on feet: Secondary | ICD-10-CM | POA: Diagnosis not present

## 2023-01-26 DIAGNOSIS — R2689 Other abnormalities of gait and mobility: Secondary | ICD-10-CM | POA: Diagnosis not present

## 2023-01-26 DIAGNOSIS — I471 Supraventricular tachycardia, unspecified: Secondary | ICD-10-CM | POA: Diagnosis not present

## 2023-01-26 DIAGNOSIS — I130 Hypertensive heart and chronic kidney disease with heart failure and stage 1 through stage 4 chronic kidney disease, or unspecified chronic kidney disease: Secondary | ICD-10-CM | POA: Diagnosis not present

## 2023-01-26 DIAGNOSIS — R0902 Hypoxemia: Secondary | ICD-10-CM | POA: Diagnosis not present

## 2023-01-26 DIAGNOSIS — I10 Essential (primary) hypertension: Secondary | ICD-10-CM | POA: Diagnosis not present

## 2023-01-26 DIAGNOSIS — I482 Chronic atrial fibrillation, unspecified: Secondary | ICD-10-CM | POA: Diagnosis not present

## 2023-01-26 DIAGNOSIS — N183 Chronic kidney disease, stage 3 unspecified: Secondary | ICD-10-CM | POA: Diagnosis not present

## 2023-01-26 DIAGNOSIS — Z7901 Long term (current) use of anticoagulants: Secondary | ICD-10-CM | POA: Diagnosis not present

## 2023-01-26 DIAGNOSIS — M79671 Pain in right foot: Secondary | ICD-10-CM | POA: Diagnosis not present

## 2023-01-26 DIAGNOSIS — Z8546 Personal history of malignant neoplasm of prostate: Secondary | ICD-10-CM | POA: Diagnosis not present

## 2023-01-26 DIAGNOSIS — R509 Fever, unspecified: Secondary | ICD-10-CM | POA: Diagnosis not present

## 2023-01-26 DIAGNOSIS — M549 Dorsalgia, unspecified: Secondary | ICD-10-CM | POA: Diagnosis not present

## 2023-01-26 DIAGNOSIS — S72001D Fracture of unspecified part of neck of right femur, subsequent encounter for closed fracture with routine healing: Secondary | ICD-10-CM | POA: Diagnosis not present

## 2023-01-26 DIAGNOSIS — Z881 Allergy status to other antibiotic agents status: Secondary | ICD-10-CM | POA: Diagnosis not present

## 2023-01-26 DIAGNOSIS — R58 Hemorrhage, not elsewhere classified: Secondary | ICD-10-CM | POA: Diagnosis not present

## 2023-01-26 DIAGNOSIS — Z743 Need for continuous supervision: Secondary | ICD-10-CM | POA: Diagnosis not present

## 2023-01-26 DIAGNOSIS — Z96641 Presence of right artificial hip joint: Secondary | ICD-10-CM | POA: Diagnosis not present

## 2023-01-26 DIAGNOSIS — I119 Hypertensive heart disease without heart failure: Secondary | ICD-10-CM | POA: Diagnosis not present

## 2023-01-26 DIAGNOSIS — S72001A Fracture of unspecified part of neck of right femur, initial encounter for closed fracture: Secondary | ICD-10-CM | POA: Diagnosis not present

## 2023-01-26 DIAGNOSIS — M25551 Pain in right hip: Secondary | ICD-10-CM | POA: Diagnosis not present

## 2023-01-26 DIAGNOSIS — Z471 Aftercare following joint replacement surgery: Secondary | ICD-10-CM | POA: Diagnosis not present

## 2023-01-26 DIAGNOSIS — I9581 Postprocedural hypotension: Secondary | ICD-10-CM | POA: Diagnosis not present

## 2023-01-26 DIAGNOSIS — Z79899 Other long term (current) drug therapy: Secondary | ICD-10-CM | POA: Diagnosis not present

## 2023-01-26 DIAGNOSIS — D649 Anemia, unspecified: Secondary | ICD-10-CM | POA: Diagnosis not present

## 2023-01-27 DIAGNOSIS — Z96641 Presence of right artificial hip joint: Secondary | ICD-10-CM | POA: Diagnosis not present

## 2023-01-27 DIAGNOSIS — I4891 Unspecified atrial fibrillation: Secondary | ICD-10-CM

## 2023-01-27 DIAGNOSIS — Z471 Aftercare following joint replacement surgery: Secondary | ICD-10-CM | POA: Diagnosis not present

## 2023-01-28 DIAGNOSIS — R0602 Shortness of breath: Secondary | ICD-10-CM | POA: Diagnosis not present

## 2023-01-28 DIAGNOSIS — Z96641 Presence of right artificial hip joint: Secondary | ICD-10-CM | POA: Diagnosis not present

## 2023-01-28 DIAGNOSIS — Z471 Aftercare following joint replacement surgery: Secondary | ICD-10-CM | POA: Diagnosis not present

## 2023-01-29 DIAGNOSIS — M25572 Pain in left ankle and joints of left foot: Secondary | ICD-10-CM | POA: Diagnosis not present

## 2023-02-02 DIAGNOSIS — I509 Heart failure, unspecified: Secondary | ICD-10-CM | POA: Diagnosis not present

## 2023-02-02 DIAGNOSIS — M6281 Muscle weakness (generalized): Secondary | ICD-10-CM | POA: Insufficient documentation

## 2023-02-02 DIAGNOSIS — I700 Atherosclerosis of aorta: Secondary | ICD-10-CM | POA: Diagnosis not present

## 2023-02-02 DIAGNOSIS — K5900 Constipation, unspecified: Secondary | ICD-10-CM | POA: Insufficient documentation

## 2023-02-02 DIAGNOSIS — M79671 Pain in right foot: Secondary | ICD-10-CM | POA: Diagnosis not present

## 2023-02-02 DIAGNOSIS — R293 Abnormal posture: Secondary | ICD-10-CM | POA: Insufficient documentation

## 2023-02-02 DIAGNOSIS — I4891 Unspecified atrial fibrillation: Secondary | ICD-10-CM | POA: Diagnosis not present

## 2023-02-02 DIAGNOSIS — M25572 Pain in left ankle and joints of left foot: Secondary | ICD-10-CM | POA: Diagnosis not present

## 2023-02-02 DIAGNOSIS — R2689 Other abnormalities of gait and mobility: Secondary | ICD-10-CM | POA: Diagnosis not present

## 2023-02-02 DIAGNOSIS — M25551 Pain in right hip: Secondary | ICD-10-CM | POA: Diagnosis not present

## 2023-02-02 DIAGNOSIS — M810 Age-related osteoporosis without current pathological fracture: Secondary | ICD-10-CM | POA: Insufficient documentation

## 2023-02-02 DIAGNOSIS — I10 Essential (primary) hypertension: Secondary | ICD-10-CM | POA: Diagnosis not present

## 2023-02-02 DIAGNOSIS — I5022 Chronic systolic (congestive) heart failure: Secondary | ICD-10-CM | POA: Diagnosis not present

## 2023-02-02 DIAGNOSIS — I499 Cardiac arrhythmia, unspecified: Secondary | ICD-10-CM | POA: Diagnosis not present

## 2023-02-02 DIAGNOSIS — R0989 Other specified symptoms and signs involving the circulatory and respiratory systems: Secondary | ICD-10-CM | POA: Diagnosis not present

## 2023-02-02 DIAGNOSIS — R269 Unspecified abnormalities of gait and mobility: Secondary | ICD-10-CM | POA: Insufficient documentation

## 2023-02-02 DIAGNOSIS — C61 Malignant neoplasm of prostate: Secondary | ICD-10-CM | POA: Diagnosis not present

## 2023-02-02 DIAGNOSIS — G8918 Other acute postprocedural pain: Secondary | ICD-10-CM | POA: Diagnosis not present

## 2023-02-02 DIAGNOSIS — R0902 Hypoxemia: Secondary | ICD-10-CM | POA: Diagnosis not present

## 2023-02-02 DIAGNOSIS — R000 Tachycardia, unspecified: Secondary | ICD-10-CM | POA: Diagnosis not present

## 2023-02-02 DIAGNOSIS — Z743 Need for continuous supervision: Secondary | ICD-10-CM | POA: Diagnosis not present

## 2023-02-02 DIAGNOSIS — N183 Chronic kidney disease, stage 3 unspecified: Secondary | ICD-10-CM | POA: Diagnosis not present

## 2023-02-02 DIAGNOSIS — D649 Anemia, unspecified: Secondary | ICD-10-CM | POA: Insufficient documentation

## 2023-02-02 DIAGNOSIS — R9431 Abnormal electrocardiogram [ECG] [EKG]: Secondary | ICD-10-CM | POA: Diagnosis not present

## 2023-02-02 DIAGNOSIS — I7121 Aneurysm of the ascending aorta, without rupture: Secondary | ICD-10-CM | POA: Diagnosis not present

## 2023-02-02 DIAGNOSIS — I119 Hypertensive heart disease without heart failure: Secondary | ICD-10-CM | POA: Diagnosis not present

## 2023-02-02 DIAGNOSIS — R262 Difficulty in walking, not elsewhere classified: Secondary | ICD-10-CM | POA: Diagnosis not present

## 2023-02-02 DIAGNOSIS — I480 Paroxysmal atrial fibrillation: Secondary | ICD-10-CM | POA: Diagnosis not present

## 2023-02-02 DIAGNOSIS — E785 Hyperlipidemia, unspecified: Secondary | ICD-10-CM | POA: Diagnosis not present

## 2023-02-02 DIAGNOSIS — S72001D Fracture of unspecified part of neck of right femur, subsequent encounter for closed fracture with routine healing: Secondary | ICD-10-CM | POA: Diagnosis not present

## 2023-02-02 DIAGNOSIS — N401 Enlarged prostate with lower urinary tract symptoms: Secondary | ICD-10-CM | POA: Diagnosis not present

## 2023-02-02 DIAGNOSIS — R2681 Unsteadiness on feet: Secondary | ICD-10-CM | POA: Insufficient documentation

## 2023-02-02 DIAGNOSIS — I959 Hypotension, unspecified: Secondary | ICD-10-CM | POA: Diagnosis not present

## 2023-02-02 DIAGNOSIS — L24A2 Irritant contact dermatitis due to fecal, urinary or dual incontinence: Secondary | ICD-10-CM | POA: Diagnosis not present

## 2023-02-03 DIAGNOSIS — D649 Anemia, unspecified: Secondary | ICD-10-CM | POA: Diagnosis not present

## 2023-02-03 DIAGNOSIS — R262 Difficulty in walking, not elsewhere classified: Secondary | ICD-10-CM | POA: Diagnosis not present

## 2023-02-03 DIAGNOSIS — I119 Hypertensive heart disease without heart failure: Secondary | ICD-10-CM | POA: Insufficient documentation

## 2023-02-03 DIAGNOSIS — G8918 Other acute postprocedural pain: Secondary | ICD-10-CM | POA: Diagnosis not present

## 2023-02-03 DIAGNOSIS — M25579 Pain in unspecified ankle and joints of unspecified foot: Secondary | ICD-10-CM | POA: Insufficient documentation

## 2023-02-03 DIAGNOSIS — M25559 Pain in unspecified hip: Secondary | ICD-10-CM | POA: Insufficient documentation

## 2023-02-03 DIAGNOSIS — S72001D Fracture of unspecified part of neck of right femur, subsequent encounter for closed fracture with routine healing: Secondary | ICD-10-CM | POA: Diagnosis not present

## 2023-02-05 DIAGNOSIS — L24A2 Irritant contact dermatitis due to fecal, urinary or dual incontinence: Secondary | ICD-10-CM | POA: Diagnosis not present

## 2023-02-06 ENCOUNTER — Other Ambulatory Visit (HOSPITAL_BASED_OUTPATIENT_CLINIC_OR_DEPARTMENT_OTHER): Payer: PPO | Admitting: Radiology

## 2023-02-08 DIAGNOSIS — I509 Heart failure, unspecified: Secondary | ICD-10-CM | POA: Diagnosis not present

## 2023-02-08 DIAGNOSIS — R000 Tachycardia, unspecified: Secondary | ICD-10-CM | POA: Diagnosis not present

## 2023-02-08 DIAGNOSIS — R0989 Other specified symptoms and signs involving the circulatory and respiratory systems: Secondary | ICD-10-CM | POA: Diagnosis not present

## 2023-02-08 DIAGNOSIS — Z743 Need for continuous supervision: Secondary | ICD-10-CM | POA: Diagnosis not present

## 2023-02-08 DIAGNOSIS — I4891 Unspecified atrial fibrillation: Secondary | ICD-10-CM | POA: Diagnosis not present

## 2023-02-08 DIAGNOSIS — I480 Paroxysmal atrial fibrillation: Secondary | ICD-10-CM | POA: Diagnosis not present

## 2023-02-08 DIAGNOSIS — R9431 Abnormal electrocardiogram [ECG] [EKG]: Secondary | ICD-10-CM | POA: Diagnosis not present

## 2023-02-08 DIAGNOSIS — I10 Essential (primary) hypertension: Secondary | ICD-10-CM | POA: Diagnosis not present

## 2023-02-08 DIAGNOSIS — I499 Cardiac arrhythmia, unspecified: Secondary | ICD-10-CM | POA: Diagnosis not present

## 2023-02-08 DIAGNOSIS — I959 Hypotension, unspecified: Secondary | ICD-10-CM | POA: Diagnosis not present

## 2023-02-08 LAB — LAB REPORT - SCANNED: EGFR: 70

## 2023-02-10 ENCOUNTER — Ambulatory Visit: Payer: PPO | Admitting: Cardiology

## 2023-02-11 ENCOUNTER — Telehealth: Payer: Self-pay | Admitting: Cardiology

## 2023-02-11 NOTE — Telephone Encounter (Signed)
 STAT if HR is under 50 or over 120 (normal HR is 60-100 beats per minute)  What is your heart rate?  86 currently  Do you have a log of your heart rate readings (document readings)?  Per [name], HR was elevated at 200+ Saturday and patient went to the ED. Today he had another episode of it being in the 200's around 3:30 PM, but it's back down now. [name] thinks medication may need to be adjusted. Please advise.  Do you have any other symptoms?  No

## 2023-02-11 NOTE — Telephone Encounter (Signed)
 Spoke with [name] per DPR who states that Saturday pt reported that he was dizzy. [name] states that his BP was in the 80's but HR was 150 and pt was transported to [facility] (records printed and given to Richard). Today pt was doing PT at [facility] and they noted his HR increased to 130, Dr. Durwood was consulted as the covering provider for the facility. Pt was asymptomatic today. Metoprolol  has been increased to 100 mg BID and Cardizem  prn. Please advise

## 2023-02-12 DIAGNOSIS — L24A2 Irritant contact dermatitis due to fecal, urinary or dual incontinence: Secondary | ICD-10-CM | POA: Diagnosis not present

## 2023-02-12 NOTE — Telephone Encounter (Signed)
 Recommendations reviewed with Aaron Richmond per DPR as per Dr. Erma Hay note.  Aaron Richmond verbalized understanding and had no additional questions.

## 2023-02-19 DIAGNOSIS — L24A2 Irritant contact dermatitis due to fecal, urinary or dual incontinence: Secondary | ICD-10-CM | POA: Diagnosis not present

## 2023-02-20 ENCOUNTER — Ambulatory Visit (INDEPENDENT_AMBULATORY_CARE_PROVIDER_SITE_OTHER)
Admission: RE | Admit: 2023-02-20 | Discharge: 2023-02-20 | Disposition: A | Discharge status: Home / Self Care | Payer: PPO | Source: Ambulatory Visit | Attending: Radiation Oncology | Admitting: Radiation Oncology

## 2023-02-20 DIAGNOSIS — C61 Malignant neoplasm of prostate: Secondary | ICD-10-CM | POA: Diagnosis not present

## 2023-02-20 DIAGNOSIS — I7121 Aneurysm of the ascending aorta, without rupture: Secondary | ICD-10-CM | POA: Diagnosis not present

## 2023-02-20 DIAGNOSIS — I700 Atherosclerosis of aorta: Secondary | ICD-10-CM | POA: Diagnosis not present

## 2023-02-20 MED ORDER — IOHEXOL 300 MG/ML  SOLN
100.0000 mL | Freq: Once | INTRAMUSCULAR | Status: AC | PRN
  Administered 2023-02-20: 75 mL via INTRAVENOUS

## 2023-02-25 DIAGNOSIS — L24A2 Irritant contact dermatitis due to fecal, urinary or dual incontinence: Secondary | ICD-10-CM | POA: Diagnosis not present

## 2023-03-02 ENCOUNTER — Other Ambulatory Visit: Payer: Self-pay | Admitting: Internal Medicine

## 2023-03-05 ENCOUNTER — Other Ambulatory Visit: Payer: Self-pay

## 2023-03-05 ENCOUNTER — Telehealth: Payer: Self-pay | Admitting: Cardiology

## 2023-03-05 DIAGNOSIS — J439 Emphysema, unspecified: Secondary | ICD-10-CM | POA: Diagnosis not present

## 2023-03-05 DIAGNOSIS — N400 Enlarged prostate without lower urinary tract symptoms: Secondary | ICD-10-CM | POA: Diagnosis not present

## 2023-03-05 DIAGNOSIS — E669 Obesity, unspecified: Secondary | ICD-10-CM | POA: Diagnosis not present

## 2023-03-05 DIAGNOSIS — R7303 Prediabetes: Secondary | ICD-10-CM | POA: Diagnosis not present

## 2023-03-05 DIAGNOSIS — I130 Hypertensive heart and chronic kidney disease with heart failure and stage 1 through stage 4 chronic kidney disease, or unspecified chronic kidney disease: Secondary | ICD-10-CM | POA: Diagnosis not present

## 2023-03-05 DIAGNOSIS — D62 Acute posthemorrhagic anemia: Secondary | ICD-10-CM | POA: Diagnosis not present

## 2023-03-05 DIAGNOSIS — K5901 Slow transit constipation: Secondary | ICD-10-CM | POA: Diagnosis not present

## 2023-03-05 DIAGNOSIS — L89153 Pressure ulcer of sacral region, stage 3: Secondary | ICD-10-CM | POA: Diagnosis not present

## 2023-03-05 DIAGNOSIS — I5042 Chronic combined systolic (congestive) and diastolic (congestive) heart failure: Secondary | ICD-10-CM | POA: Diagnosis not present

## 2023-03-05 DIAGNOSIS — I083 Combined rheumatic disorders of mitral, aortic and tricuspid valves: Secondary | ICD-10-CM | POA: Diagnosis not present

## 2023-03-05 DIAGNOSIS — M5416 Radiculopathy, lumbar region: Secondary | ICD-10-CM | POA: Diagnosis not present

## 2023-03-05 DIAGNOSIS — I2510 Atherosclerotic heart disease of native coronary artery without angina pectoris: Secondary | ICD-10-CM | POA: Diagnosis not present

## 2023-03-05 DIAGNOSIS — E7800 Pure hypercholesterolemia, unspecified: Secondary | ICD-10-CM | POA: Diagnosis not present

## 2023-03-05 DIAGNOSIS — I480 Paroxysmal atrial fibrillation: Secondary | ICD-10-CM | POA: Diagnosis not present

## 2023-03-05 DIAGNOSIS — G4733 Obstructive sleep apnea (adult) (pediatric): Secondary | ICD-10-CM | POA: Diagnosis not present

## 2023-03-05 DIAGNOSIS — D631 Anemia in chronic kidney disease: Secondary | ICD-10-CM | POA: Diagnosis not present

## 2023-03-05 DIAGNOSIS — N1831 Chronic kidney disease, stage 3a: Secondary | ICD-10-CM | POA: Diagnosis not present

## 2023-03-05 DIAGNOSIS — M80051D Age-related osteoporosis with current pathological fracture, right femur, subsequent encounter for fracture with routine healing: Secondary | ICD-10-CM | POA: Diagnosis not present

## 2023-03-05 DIAGNOSIS — I7121 Aneurysm of the ascending aorta, without rupture: Secondary | ICD-10-CM | POA: Diagnosis not present

## 2023-03-05 DIAGNOSIS — M150 Primary generalized (osteo)arthritis: Secondary | ICD-10-CM | POA: Diagnosis not present

## 2023-03-05 DIAGNOSIS — I429 Cardiomyopathy, unspecified: Secondary | ICD-10-CM | POA: Diagnosis not present

## 2023-03-05 DIAGNOSIS — M519 Unspecified thoracic, thoracolumbar and lumbosacral intervertebral disc disorder: Secondary | ICD-10-CM | POA: Diagnosis not present

## 2023-03-05 DIAGNOSIS — I700 Atherosclerosis of aorta: Secondary | ICD-10-CM | POA: Diagnosis not present

## 2023-03-05 DIAGNOSIS — M4316 Spondylolisthesis, lumbar region: Secondary | ICD-10-CM | POA: Diagnosis not present

## 2023-03-05 NOTE — Telephone Encounter (Signed)
 Pt c/o medication issue:  1. Name of Medication:   metoprolol tartrate (LOPRESSOR) 50 MG tablet  torsemide (DEMADEX) 20 MG tablet  sacubitril-valsartan (ENTRESTO) 24-26 MG   2. How are you currently taking this medication (dosage and times per day)?   3. Are you having a reaction (difficulty breathing--STAT)?   4. What is your medication issue?   Caller Revonda Standard) noted patient has been taking medication prescribed differently when he was at [facility].  Caller wants to clarify how patient should be taking his medications and noted today patient's BP was 94/50.  Caller stated patient was taking the metoprolol tartrate (LOPRESSOR) 50 MG tablet medication 75 mg, twice daily (patient was supposed to be taking 100 mg daily)  Caller stated patient was taking torsemide (DEMADEX) 20 MG tablet 20 mg every day and 40 mg on Monday, Wednesday and Friday   Caller stated patient was taking sacubitril-valsartan (ENTRESTO) 24-26 MG 1 whole tablet daily

## 2023-03-06 ENCOUNTER — Telehealth: Payer: Self-pay | Admitting: Radiation Oncology

## 2023-03-06 NOTE — Telephone Encounter (Signed)
 Patient has been scheduled. Aware of appt date and time.    FW: CT scan Received: 2 days ago Dyane Dustman, RN sent to [employer] Scheduling Needs follow up with Dr. Thersa Salt next week or the following week.  CT not read yet.       Previous Messages    ----- Message ----- From: Dyane Dustman, RN Sent: 03/03/2023   9:00 AM EST To: Dyane Dustman, RN Subject: CT scan                                        CT Chest with & without contrast for end or beginning of March 2025.

## 2023-03-07 ENCOUNTER — Other Ambulatory Visit: Payer: Self-pay

## 2023-03-07 ENCOUNTER — Telehealth: Payer: Self-pay | Admitting: Internal Medicine

## 2023-03-07 NOTE — Telephone Encounter (Signed)
 [facility] called to report bp readings, left arm was 70/40 and right arm was 80/40. I spoke with the clinical manager crystal about something the pt could do till he comes in for his [facility] f/u Monday. Dr. Nelson Chimes was the only doctor available at the time to speak with, He advised [place] to tell home health have the patient drink plenty of water or gatorade and to not take any blood pressure medications till he sees Dr. Leonia Reader Monday.

## 2023-03-07 NOTE — Telephone Encounter (Signed)
 Called patient to clarify how he should be taking his medications. Patient stated that he had spoken with his home health nurse Revonda Standard and she had helped him to clarify what doses of the medications and how he should be taking them. I also reviewed the patient's medications with him and he was taking them accurately. Patient had no further questions at this time.

## 2023-03-10 ENCOUNTER — Encounter: Payer: Self-pay | Admitting: Internal Medicine

## 2023-03-10 ENCOUNTER — Ambulatory Visit: Payer: PPO | Admitting: Internal Medicine

## 2023-03-10 VITALS — BP 138/86 | HR 210 | Temp 98.7°F | Resp 18 | Ht 70.0 in | Wt 164.0 lb

## 2023-03-10 DIAGNOSIS — S72001A Fracture of unspecified part of neck of right femur, initial encounter for closed fracture: Secondary | ICD-10-CM | POA: Insufficient documentation

## 2023-03-10 DIAGNOSIS — L89150 Pressure ulcer of sacral region, unstageable: Secondary | ICD-10-CM | POA: Diagnosis not present

## 2023-03-10 DIAGNOSIS — S72001D Fracture of unspecified part of neck of right femur, subsequent encounter for closed fracture with routine healing: Secondary | ICD-10-CM | POA: Diagnosis not present

## 2023-03-10 DIAGNOSIS — I480 Paroxysmal atrial fibrillation: Secondary | ICD-10-CM

## 2023-03-10 DIAGNOSIS — I10 Essential (primary) hypertension: Secondary | ICD-10-CM

## 2023-03-10 HISTORY — DX: Pressure ulcer of sacral region, unstageable: L89.150

## 2023-03-10 HISTORY — DX: Fracture of unspecified part of neck of right femur, initial encounter for closed fracture: S72.001A

## 2023-03-10 NOTE — Assessment & Plan Note (Signed)
 He is s/p repair and is situated to continued with homehealth PT.  He is using a rolling walker.  His wife states that since his surgery, he now has a right foot drop.  On my exam he still has some strength in that foot.  We will continue PT.  He will see ortho tomorrow.  He does have pain but he uses tramadol as needed.

## 2023-03-10 NOTE — Assessment & Plan Note (Signed)
 His entresto is probably the culprit with his low BP.  His BP looks good today but again he stopped metoprolol and entresto last week.  I want him to stay off entresto for now (we will reevaluate on Friday) and continue on his toresemide and metoprolol.  He is to check his BP 2-3 hours after his morning doses.  His wife or homehealth need to call me if his SBP <100.

## 2023-03-10 NOTE — Progress Notes (Addendum)
 Office Visit  Subjective   Patient ID: Aaron Richmond   DOB: [birth year]   Age: 78 y.o.   MRN: [id number]   Chief Complaint Chief Complaint  Patient presents with   Follow-up     History of Present Illness Aaron Richmond is a 78 yo male who comes in today for hospital followup.  He was brought into [facility] ER on 01/26/2023 where he sustained a fall.  He was unable to bear weight on her right side.  A xray at that time showed an acute right femoral neck fracture.  He underwent a right hip hemiarthroplasty on 01/27/2023.  Postoperatively he spiked a temperature and went into A. Fib with RVR.  He was started on iv Antbiotics and he had hypotension requiring IVF's.  He had some acute blood loss anemia but did not require a blood transfusion.  The patient was discharged on 02/02/2023 and transferred to [facility] for further rehab.  During his stay at [facility], he was sent out acutely to the ER due to A. Fib with RVR on 02/08/2023.  He was given medications and his HR improved and he sent back to [facility].  The patient said he continued to rehab him but he had some problems with BP being low.  He states they did not change any of his medications.  He was discharged from [facility] on 03/04/2023 and homehealth has been ordered as well as wound as he developed a sacral decubitus while he was at [facility].  His wife did call my partner last week as homehealth noted his BP readings showed left arm 70/40 and right arm 80/40. He asked the patient to drink plenty of fluids and hold his BP meds until he comes to see me today.  He held the metoprolol 50mg  BID and his entresto 24/26mg  BID and continued on his toresemide 20mg  po BID except on M/W/F he takes 40mg  in AM and 20mg  in PM.  His wife states that yesterday he went into A. Fib with RVR and she gave him left over cardizem 30mg  x1 and his HR went from 190 down to 74 and it occurred again today with HR 204 and after one dose he went to 97.  Today, he denies any chest pain, palpitations, SOB,  peripheral edema, generalized weakness or other problems.  Anticoagulation status: He remains on lifelong xarelto therapy per cardiology. CHADVASC2 score is 3.  He denies any bruising, melena, BRBPR or other bleeding problems.        Past Medical History Past Medical History:  Diagnosis Date   Aneurysm of aortic root    Overview:  Last Assessment & Plan:  Planning to see Dr Tyrone Sage for evaluation of 5cm aneurysm.  - will check PFT in prep for possible procedure / SGY   Aortic atherosclerosis (HCC) 06/14/2022   Aortic regurgitation    Aortic valve insufficiency    Ascending aortic aneurysm (HCC) 09/11/2016   Atherosclerotic vascular disease 05/27/2022   Atrial fibrillation (HCC)    Atrial fibrillation with RVR (HCC) 07/05/2022   BMI 27.0-27.9,adult 06/14/2022   BPH (benign prostatic hyperplasia)    Cardiomyopathy (HCC) 09/11/2016   CKD (chronic kidney disease) stage 2, GFR 60-89 ml/min 11/23/2021   Coronary artery calcification seen on CT scan 10/13/2017   Essential hypertension 06/14/2022   High risk medication use 07/31/2022   History of cardiomyopathy 11/09/2021   History of pulmonary embolism 09/24/2012   Overview:  Last Assessment & Plan:  Hx PE x 2 per notes, ?  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  - needs auto-titration study by [employer] or local company, data to RB to adjust his home device.    Prediabetes     Preoperative clearance 10/13/2017   Primary osteoarthritis involving multiple joints 06/14/2022   Prostate cancer metastatic to bone (HCC) 06/14/2022   Pulmonary embolism (HCC)    Rhinitis    Seasonal allergies 06/14/2022   Spondylolisthesis of lumbar region 12/12/2021   Stage 3a chronic kidney disease (HCC) 06/14/2022     Allergies Allergies  Allergen Reactions   Ciprofloxacin     Contraindicated because of increased risk of rupture of ascending thoracic aortic aneurysm     Medications  Current Outpatient Medications:    acetaminophen (TYLENOL) 500 MG tablet, Take 500 mg by mouth every 6 (six) hours as needed for mild pain or moderate pain., Disp: , Rfl:    FARXIGA 5 MG TABS tablet, Take 1 tablet by mouth once daily, Disp: 90 tablet, Rfl: 0   HYDROcodone-acetaminophen (NORCO/VICODIN) 5-325 MG tablet, Take 1 tablet by mouth 3 (three) times daily., Disp: , Rfl:    KLOR-CON M20 20 MEQ tablet, Take 1 tablet (20 mEq total) by mouth daily., Disp: 90 tablet, Rfl: 3   metoprolol tartrate (LOPRESSOR) 50 MG tablet, TAKE 1 & 1/2 (ONE & ONE-HALF) TABLETS BY MOUTH TWICE DAILY, Disp: 180 tablet, Rfl: 0   Multiple Vitamin (MULTIVITAMINS PO), Take 1 tablet by mouth daily. Unknown strength, Disp: , Rfl:    rivaroxaban (XARELTO) 20 MG TABS tablet, Take 1 tablet (20 mg total) by mouth daily with supper., Disp: 90 tablet, Rfl: 2   rosuvastatin (CRESTOR) 5 MG tablet, Take 1 tablet (5 mg total) by mouth daily., Disp: 90 tablet, Rfl: 2   tamsulosin (FLOMAX) 0.4 MG CAPS capsule, TAKE 1 CAPSULE BY MOUTH TWICE DAILY 30  MINUTES  FOLLOWING  THE  SAME  MEAL  EACH  DAY, Disp: 180 capsule, Rfl: 0   torsemide (DEMADEX) 20 MG tablet, Take one tab po BID except on M/W/F take 2 tabs po in AM and 1 tab po in PM (Patient taking differently: Take 20 mg by mouth 2 (two) times daily. Take one tab po BID except on M/W/F take 2 tabs po in AM and 1 tab po in PM), Disp: 216 tablet, Rfl: 3   UNABLE TO FIND, Take 2 tablets by  mouth daily. Med Name: Curamin/ Unknown strength, Disp: , Rfl:    Review of Systems Review of Systems  Constitutional:  Negative for chills and fever.  Eyes:  Negative for blurred vision and double vision.  Respiratory:  Negative for cough, hemoptysis and shortness of breath.   Cardiovascular:  Negative for chest pain, palpitations and leg swelling.  Gastrointestinal:  Negative for abdominal pain, constipation, diarrhea, nausea and vomiting.  Musculoskeletal:        He has hip pain.       Objective:    Vitals BP  138/86   Pulse (!) 210   Temp 98.7 F (37.1 C)   Resp 18   Ht 5\' 10"  (1.778 m)   Wt 164 lb (74.4 kg)   SpO2 92%   BMI 23.53 kg/m    Physical Examination Physical Exam Constitutional:      Appearance: Normal appearance. He is not ill-appearing.  Cardiovascular:     Rate and Rhythm: Normal rate and regular rhythm.     Pulses: Normal pulses.     Heart sounds: No murmur heard.    No friction rub. No gallop.  Pulmonary:     Effort: Pulmonary effort is normal. No respiratory distress.     Breath sounds: No wheezing, rhonchi or rales.  Abdominal:     General: Abdomen is flat. Bowel sounds are normal. There is no distension.     Palpations: Abdomen is soft.     Tenderness: There is no abdominal tenderness.  Musculoskeletal:     Right lower leg: No edema.     Left lower leg: No edema.  Skin:    General: Skin is warm and dry.     Findings: No rash.  Neurological:     Mental Status: He is alert.        Assessment & Plan:   Atrial fibrillation His HR was noted on pulse oximetry but on my exam his HR is probably not 200.  He gave himself another dose of cardizem 30mg  po x 1 in the office.  I want him to restart his metoprolol for rate control tonight in about 4-6 hours.  He is not having any symptoms with his A. Fib.  He will continue on xarelto.  I want to see him back on Friday to recheck him.  Essential hypertension His entresto is probably the culprit with  his low BP.  His BP looks good today but again he stopped metoprolol and entresto last week.  I want him to stay off entresto for now (we will reevaluate on Friday) and continue on his toresemide and metoprolol.  He is to check his BP 2-3 hours after his morning doses.  His wife or homehealth need to call me if his SBP <100.  Closed fracture of right hip [facility]) He is s/p repair and is situated to continued with homehealth PT.  He is using a rolling walker.  His wife states that since his surgery, he now has a right foot drop.  On my exam he still has some strength in that foot.  We will continue PT.  He will see ortho tomorrow.  He does have pain but he uses tramadol as needed.  Unstageable pressure ulcer of sacral region [facility]) He has an unstageable sacral pressure ulcer with slough across the entire wound bed.  I want his wife to clean the wound with cleaning but do wet to dry dressing changes every 4-6 hours until wound care comes and see the patient.    Return in about 14 weeks (around 06/16/2023) for annual.   Crist Fat, MD

## 2023-03-10 NOTE — Assessment & Plan Note (Signed)
 He has an unstageable sacral pressure ulcer with slough across the entire wound bed.  I want his wife to clean the wound with cleaning but do wet to dry dressing changes every 4-6 hours until wound care comes and see the patient.

## 2023-03-10 NOTE — Assessment & Plan Note (Signed)
 His HR was noted on pulse oximetry but on my exam his HR is probably not 200.  He gave himself another dose of cardizem 30mg  po x 1 in the office.  I want him to restart his metoprolol for rate control tonight in about 4-6 hours.  He is not having any symptoms with his A. Fib.  He will continue on xarelto.  I want to see him back on Friday to recheck him.

## 2023-03-11 DIAGNOSIS — S72001A Fracture of unspecified part of neck of right femur, initial encounter for closed fracture: Secondary | ICD-10-CM | POA: Diagnosis not present

## 2023-03-11 DIAGNOSIS — M21371 Foot drop, right foot: Secondary | ICD-10-CM | POA: Diagnosis not present

## 2023-03-11 LAB — CBC WITH DIFFERENTIAL/PLATELET
Basophils Absolute: 0 10*3/uL (ref 0.0–0.2)
Basos: 0 %
EOS (ABSOLUTE): 0 10*3/uL (ref 0.0–0.4)
Eos: 0 %
Hematocrit: 37.5 % (ref 37.5–51.0)
Hemoglobin: 12.2 g/dL — ABNORMAL LOW (ref 13.0–17.7)
Immature Grans (Abs): 0.1 10*3/uL (ref 0.0–0.1)
Immature Granulocytes: 1 %
Lymphocytes Absolute: 1.5 10*3/uL (ref 0.7–3.1)
Lymphs: 15 %
MCH: 30.3 pg (ref 26.6–33.0)
MCHC: 32.5 g/dL (ref 31.5–35.7)
MCV: 93 fL (ref 79–97)
Monocytes Absolute: 1.1 10*3/uL — ABNORMAL HIGH (ref 0.1–0.9)
Monocytes: 11 %
Neutrophils Absolute: 7.4 10*3/uL — ABNORMAL HIGH (ref 1.4–7.0)
Neutrophils: 73 %
Platelets: 361 10*3/uL (ref 150–450)
RBC: 4.03 x10E6/uL — ABNORMAL LOW (ref 4.14–5.80)
RDW: 14.1 % (ref 11.6–15.4)
WBC: 10.1 10*3/uL (ref 3.4–10.8)

## 2023-03-11 LAB — MAGNESIUM: Magnesium: 2.3 mg/dL (ref 1.6–2.3)

## 2023-03-11 LAB — CMP14 + ANION GAP
ALT: 11 IU/L (ref 0–44)
AST: 14 IU/L (ref 0–40)
Albumin: 4.1 g/dL (ref 3.8–4.8)
Alkaline Phosphatase: 118 IU/L (ref 44–121)
Anion Gap: 20 mmol/L — ABNORMAL HIGH (ref 10.0–18.0)
BUN/Creatinine Ratio: 15 (ref 10–24)
BUN: 18 mg/dL (ref 8–27)
Bilirubin Total: 0.4 mg/dL (ref 0.0–1.2)
CO2: 24 mmol/L (ref 20–29)
Calcium: 9.9 mg/dL (ref 8.6–10.2)
Chloride: 96 mmol/L (ref 96–106)
Creatinine, Ser: 1.24 mg/dL (ref 0.76–1.27)
Globulin, Total: 2.7 g/dL (ref 1.5–4.5)
Glucose: 92 mg/dL (ref 70–99)
Potassium: 4.2 mmol/L (ref 3.5–5.2)
Sodium: 140 mmol/L (ref 134–144)
Total Protein: 6.8 g/dL (ref 6.0–8.5)
eGFR: 60 mL/min/{1.73_m2} (ref >59)

## 2023-03-12 ENCOUNTER — Ambulatory Visit
Admission: RE | Admit: 2023-03-12 | Discharge: 2023-03-12 | Disposition: A | Discharge status: Home / Self Care | Payer: PPO | Source: Ambulatory Visit | Attending: Radiation Oncology | Admitting: Radiation Oncology

## 2023-03-12 VITALS — BP 104/50 | HR 92 | Temp 98.3°F | Resp 16 | Ht 70.0 in | Wt 164.0 lb

## 2023-03-12 DIAGNOSIS — C7951 Secondary malignant neoplasm of bone: Secondary | ICD-10-CM | POA: Insufficient documentation

## 2023-03-12 DIAGNOSIS — E7800 Pure hypercholesterolemia, unspecified: Secondary | ICD-10-CM | POA: Insufficient documentation

## 2023-03-12 DIAGNOSIS — Z923 Personal history of irradiation: Secondary | ICD-10-CM | POA: Diagnosis not present

## 2023-03-12 DIAGNOSIS — G4730 Sleep apnea, unspecified: Secondary | ICD-10-CM | POA: Insufficient documentation

## 2023-03-12 DIAGNOSIS — I2510 Atherosclerotic heart disease of native coronary artery without angina pectoris: Secondary | ICD-10-CM | POA: Diagnosis not present

## 2023-03-12 DIAGNOSIS — N1831 Chronic kidney disease, stage 3a: Secondary | ICD-10-CM | POA: Insufficient documentation

## 2023-03-12 DIAGNOSIS — Z7901 Long term (current) use of anticoagulants: Secondary | ICD-10-CM | POA: Insufficient documentation

## 2023-03-12 DIAGNOSIS — I700 Atherosclerosis of aorta: Secondary | ICD-10-CM | POA: Insufficient documentation

## 2023-03-12 DIAGNOSIS — I130 Hypertensive heart and chronic kidney disease with heart failure and stage 1 through stage 4 chronic kidney disease, or unspecified chronic kidney disease: Secondary | ICD-10-CM | POA: Diagnosis not present

## 2023-03-12 DIAGNOSIS — Z79899 Other long term (current) drug therapy: Secondary | ICD-10-CM | POA: Insufficient documentation

## 2023-03-12 DIAGNOSIS — Z86711 Personal history of pulmonary embolism: Secondary | ICD-10-CM | POA: Insufficient documentation

## 2023-03-12 DIAGNOSIS — I7121 Aneurysm of the ascending aorta, without rupture: Secondary | ICD-10-CM | POA: Insufficient documentation

## 2023-03-12 DIAGNOSIS — N62 Hypertrophy of breast: Secondary | ICD-10-CM | POA: Diagnosis not present

## 2023-03-12 DIAGNOSIS — I429 Cardiomyopathy, unspecified: Secondary | ICD-10-CM | POA: Diagnosis not present

## 2023-03-12 DIAGNOSIS — C61 Malignant neoplasm of prostate: Secondary | ICD-10-CM | POA: Insufficient documentation

## 2023-03-12 DIAGNOSIS — I4891 Unspecified atrial fibrillation: Secondary | ICD-10-CM | POA: Diagnosis not present

## 2023-03-12 DIAGNOSIS — Z7984 Long term (current) use of oral hypoglycemic drugs: Secondary | ICD-10-CM | POA: Insufficient documentation

## 2023-03-13 NOTE — Progress Notes (Signed)
 Radiation Oncology         [phone] ________________________________  Name: Aaron Richmond        MRN: [id number]  Date of Service: 03/12/2023 DOB: 07/06/1945  CC:Van Domenic Schwab, MD  Crist Fat, MD     REFERRING PHYSICIAN:  Modena Slater, MD   DIAGNOSIS: Gleason 9 adenocarcinoma of the prostate   HISTORY OF PRESENT ILLNESS: Aaron Richmond is a 78 y.o. male seen today in follow-up for his Gleason 9 adenocarcinoma of the prostate.  Aaron Richmond was last seen in our department in November of last year.  Since that time, Aaron Richmond suffered a hip fracture, and has been at [facility] for rehabilitation.  His last PSA in May PET scan had shown possible metastatic disease involving his left anterior ribs, prompting me to order a CT scan of the chest; it revealed no evidence of osseous or soft tissue metastasis.  Specifically, no sclerotic lesions were seen to correspond to the areas of PET affinity on the PSMA PET scan.    PAST MEDICAL HISTORY:  Past Medical History:  Diagnosis Date   Aneurysm of aortic root    Overview:  Last Assessment & Plan:  Planning to see Dr Tyrone Sage for evaluation of 5cm aneurysm.  - will check PFT in prep for possible procedure / SGY   Aortic atherosclerosis (HCC) 06/14/2022   Aortic regurgitation    Aortic valve insufficiency    Ascending aortic aneurysm (HCC) 09/11/2016   Atherosclerotic vascular disease 05/27/2022   Atrial fibrillation (HCC)    Atrial fibrillation with RVR (HCC) 07/05/2022   BMI 27.0-27.9,adult 06/14/2022   BPH (benign prostatic hyperplasia)    Cardiomyopathy (HCC) 09/11/2016   CKD (chronic kidney disease) stage 2, GFR 60-89 ml/min 11/23/2021   Coronary artery calcification seen on CT scan 10/13/2017   Essential hypertension 06/14/2022   High risk medication use 07/31/2022   History of cardiomyopathy 11/09/2021   History of pulmonary embolism 09/24/2012   Overview:  Last Assessment & Plan:  Hx PE x 2 per notes, ? 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  - needs auto-titration study by [employer] or local company, data to RB to adjust his home device.    Prediabetes    Preoperative clearance 10/13/2017   Primary osteoarthritis involving multiple joints 06/14/2022   Prostate cancer metastatic to bone (HCC) 06/14/2022   Pulmonary embolism (HCC)    Rhinitis    Seasonal allergies 06/14/2022   Spondylolisthesis of lumbar region 12/12/2021   Stage 3a chronic kidney disease (HCC) 06/14/2022       PAST SURGICAL HISTORY: Past Surgical History:  Procedure Laterality Date   APPENDECTOMY     HERNIA REPAIR     LUMBAR LAMINECTOMY  02/06/2022   Facetectom & foraminotomy for decompression of the cauda equina & nerve root L4-5, Addison Bailey, MD   TONSILLECTOMY       FAMILY HISTORY:  Family History  Problem Relation  Age of Onset   Stroke Mother    Alzheimer's disease Mother    Heart attack Mother    Stroke Father    Diabetes Mellitus II Father      ALLERGIES: Ciprofloxacin   MEDICATIONS:  Current Outpatient Medications  Medication Sig Dispense Refill   traMADol (ULTRAM) 50 MG tablet Take 100 mg by mouth 3 (three) times daily.     acetaminophen (TYLENOL) 500 MG tablet Take 500 mg by mouth every 6 (six) hours as needed for mild pain or moderate pain.     FARXIGA 5 MG TABS tablet Take 1 tablet by mouth once daily 90 tablet 0   HYDROcodone-acetaminophen  (NORCO/VICODIN) 5-325 MG tablet Take 1 tablet by mouth 3 (three) times daily.     KLOR-CON M20 20 MEQ tablet Take 1 tablet (20 mEq total) by mouth daily. 90 tablet 3   metoprolol tartrate (LOPRESSOR) 50 MG tablet TAKE 1 & 1/2 (ONE & ONE-HALF) TABLETS BY MOUTH TWICE DAILY 180 tablet 0   Multiple Vitamin (MULTIVITAMINS PO) Take 1 tablet by mouth daily. Unknown strength     rivaroxaban (XARELTO) 20 MG TABS tablet Take 1 tablet (20 mg total) by mouth daily with supper. 90 tablet 2   rosuvastatin (CRESTOR) 5 MG tablet Take 1 tablet (5 mg total) by mouth daily. 90 tablet 2   tamsulosin (FLOMAX) 0.4 MG CAPS capsule TAKE 1 CAPSULE BY MOUTH TWICE DAILY 30  MINUTES  FOLLOWING  THE  SAME  MEAL  EACH  DAY 180 capsule 0   torsemide (DEMADEX) 20 MG tablet Take one tab po BID except on M/W/F take 2 tabs po in AM and 1 tab po in PM (Patient taking differently: Take 20 mg by mouth 2 (two) times daily. Take one tab po BID except on M/W/F take 2 tabs po in AM and 1 tab po in PM) 216 tablet 3   UNABLE TO FIND Take 2 tablets by mouth daily. Med Name: Curamin/ Unknown strength     No current facility-administered medications for this encounter.     REVIEW OF SYSTEMS: Aaron Richmond reports that his back pain has resolved.  Aaron Richmond states that Aaron Richmond is urinating frequently recently, as Aaron Richmond has been placed on a diuretic for his heart failure.  Aaron Richmond reports no burning with urination, or hematuria.    PHYSICAL EXAM:  Wt Readings from Last 3 Encounters:  03/12/23 164 lb (74.4 kg)  03/10/23 164 lb (74.4 kg)  12/11/22 175 lb 8 oz (79.6 kg)   Temp Readings from Last 3 Encounters:  03/12/23 98.3 F (36.8 C) (Oral)  03/10/23 98.7 F (37.1 C)  12/11/22 98 F (36.7 C) (Temporal)   BP Readings from Last 3 Encounters:  03/12/23 (!) 104/50  03/10/23 138/86  12/11/22 120/60   Pulse Readings from Last 3 Encounters:  03/12/23 92  03/10/23 (!) 210  12/11/22 97   Pain Assessment Pain Score: 6 /10  Aaron Richmond is seated in a wheelchair.  Aaron Richmond is in  no apparent distress.  Abdomen is nontender and nondistended.    LABORATORY DATA:  Lab Results  Component Value Date   WBC 10.1 03/10/2023   HGB 12.2 (L) 03/10/2023   HCT 37.5 03/10/2023   MCV 93 03/10/2023   PLT 361 03/10/2023   Lab Results  Component Value Date   NA 140 03/10/2023   K 4.2 03/10/2023   CL 96 03/10/2023   CO2 24 03/10/2023   Lab Results  Component Value Date   ALT 11 03/10/2023  AST 14 03/10/2023   ALKPHOS 118 03/10/2023   BILITOT 0.4 03/10/2023      RADIOGRAPHY: CT Chest W Contrast Result Date: 03/07/2023 CLINICAL DATA:  Prostate cancer. Abnormal PET with rib uptake. * Tracking Code: BO * EXAM: CT CHEST WITH CONTRAST TECHNIQUE: Multidetector CT imaging of the chest was performed during intravenous contrast administration. RADIATION DOSE REDUCTION: This exam was performed according to the departmental dose-optimization program which includes automated exposure control, adjustment of the mA and/or kV according to patient size and/or use of iterative reconstruction technique. CONTRAST:  75mL OMNIPAQUE IOHEXOL 300 MG/ML  SOLN COMPARISON:  PET of 11/06/2022.  Chest CT of 12/02/2022 FINDINGS: Cardiovascular: Aortic atherosclerosis. Tortuous thoracic aorta. Ascending thoracic aneurysm. Based on coronal reformats, 5.4 cm within the proximal aorta on coronal image 103, similar. Normal caliber of the great vessels. Moderate cardiomegaly. Three vessel coronary artery calcification. No central pulmonary embolism, on this non-dedicated study. Mediastinum/Nodes: No mediastinal or hilar adenopathy, given limitations of unenhanced CT. Lungs/Pleura: No pleural fluid. Minimal motion degradation inferiorly. Bibasilar scarring. Upper Abdomen: Normal imaged portions of the liver, spleen, stomach, pancreas, gallbladder, adrenal glands, kidneys. No upper abdominal adenopathy. Musculoskeletal: Mild right-sided gynecomastia. Remote right rib fractures. 10th anterolateral left rib remote  fracture on 125/301. No suspicious sclerotic osseous lesion. Vertebral augmentation at L1 secondary to an underlying moderate compression deformity. Remote superior endplate mild compression deformity at L2. IMPRESSION: 1. No evidence of osseous or soft tissue metastasis. No well-defined sclerotic lesion to correspond to the areas of PET affinity within anterior lower left ribs. 2. Ascending aortic aneurysm at 5.4 cm, as on 12/02/2022. 3. Coronary artery atherosclerosis. Aortic Atherosclerosis (ICD10-I70.0). Electronically Signed   By: Jeronimo Greaves M.D.   On: 03/07/2023 20:10       IMPRESSION/PLAN: 1.   Aaron Richmond is approximately 2 years out from completion of radiation.  Aaron Richmond is currently dealing with multiple medical issues, including rehabilitation from his hip fracture, as well as congestive heart failure.  Aaron Richmond continues to follow-up with Dr. Alvester Morin.  As Aaron Richmond will continue to follow-up routinely with Dr. Alvester Morin, further follow-up in our department will be on a as needed basis for the time being, though I encouraged him to contact me at anytime with any questions or concerns Aaron Richmond may have.  I reviewed the results of his recent CT scan with him, explaining that no evidence of metastatic disease was seen.   In a visit lasting 20 minutes, greater than 50% of the time was spent face to face discussing the patient's condition, in preparation for the discussion, and coordinating the patient's care.    Karsten Vaughn A. Thersa Salt, MD   **Disclaimer: This note was dictated with voice recognition software. Similar sounding words can inadvertently be transcribed and this note may contain transcription errors which may not have been corrected upon publication of note.**

## 2023-03-14 ENCOUNTER — Encounter: Payer: Self-pay | Admitting: Internal Medicine

## 2023-03-14 ENCOUNTER — Ambulatory Visit: Admitting: Internal Medicine

## 2023-03-14 VITALS — BP 120/58 | HR 95 | Temp 97.4°F | Resp 18 | Ht 70.0 in | Wt 164.2 lb

## 2023-03-14 DIAGNOSIS — I480 Paroxysmal atrial fibrillation: Secondary | ICD-10-CM

## 2023-03-14 DIAGNOSIS — L89150 Pressure ulcer of sacral region, unstageable: Secondary | ICD-10-CM | POA: Diagnosis not present

## 2023-03-14 NOTE — Progress Notes (Signed)
 Office Visit  Subjective   Patient ID: Aaron Richmond   DOB: 04/11/1945   Age: 78 y.o.   MRN: [id number]   Chief Complaint Chief Complaint  Patient presents with   Follow-up    From Monday's appt.     History of Present Illness Mr. Cripps is a 78 yo male who comes in today for hospital followup.  He was brought into [facility] ER on 01/26/2023 where he sustained a fall.  He was unable to bear weight on her right side.  A xray at that time showed an acute right femoral neck fracture.  He underwent a right hip hemiarthroplasty on 01/27/2023.  Postoperatively he spiked a temperature and went into A. Fib with RVR.  He was started on iv Antbiotics and he had hypotension requiring IVF's.  He had some acute blood loss anemia but did not require a blood transfusion.  The patient was discharged on 02/02/2023 and transferred to [facility] for further rehab.  During his stay at [facility], he was sent out acutely to the ER due to A. Fib with RVR on 02/08/2023.  He was given medications and his HR improved and he sent back to [facility].  The patient said he continued to rehab him but he had some problems with BP being low.  He states they did not change any of his medications.  He was discharged from [facility] on 03/04/2023 and homehealth has been ordered as well as wound as he developed a sacral decubitus while he was at [facility].  His wife did call my partner last week as homehealth noted his BP readings showed left arm 70/40 and right arm 80/40. He asked the patient to drink plenty of fluids and hold his BP meds until he comes to see me today.  He held the metoprolol 50mg  BID and his entresto 24/26mg  BID and continued on his toresemide 20mg  po BID except on M/W/F he takes 40mg  in AM and 20mg  in PM.  His wife states that yesterday he went into A. Fib with RVR and she gave him left over cardizem 30mg  x1 and his HR went from 190 down to 74 and it occurred again today with HR 204 and after one dose he went to 97.  Today, he denies any chest  pain, palpitations, SOB, peripheral edema, generalized weakness or other problems.  Anticoagulation status: He remains on lifelong xarelto therapy per cardiology. CHADVASC2 score is 3.  He denies any bruising, melena, BRBPR or other bleeding problems.  Atrial fibrillation His HR was noted on pulse oximetry but on my exam his HR is probably not 200.  He gave himself another dose of cardizem 30mg  po x 1 in the office.  I want him to restart his metoprolol for rate control tonight in about 4-6 hours.  He is not having any symptoms with his A. Fib.  He will continue on xarelto.  I want to see him back on Friday to recheck him.   Essential hypertension His entresto is probably the culprit with his low BP.  His BP looks good today but again he stopped metoprolol and entresto last week.  I want him to stay off entresto for now (we will reevaluate on Friday) and continue on his toresemide and metoprolol.  He is to check his BP 2-3 hours after his morning doses.  His wife or homehealth need to call me if his SBP <100.   Closed fracture of right hip [facility]) He is s/p repair and is  situated to continued with homehealth PT.  He is using a rolling walker.  His wife states that since his surgery, he now has a right foot drop.  On my exam he still has some strength in that foot.  We will continue PT.  He will see ortho tomorrow.  He does have pain but he uses tramadol as needed.   Unstageable pressure ulcer of sacral region [facility]) He has an unstageable sacral pressure ulcer with slough across the entire wound bed.  I want his wife to clean the wound with cleaning but do wet to dry dressing changes every 4-6 hours until wound care comes and see the patient.     Return in about 14 weeks (around 06/16/2023) for annual      Past Medical History Past Medical History:  Diagnosis Date   Aneurysm of aortic root    Overview:  Last Assessment & Plan:  Planning to see Dr Tyrone Sage for evaluation of 5cm aneurysm.  - will  check PFT in prep for possible procedure / SGY   Aortic atherosclerosis (HCC) 06/14/2022   Aortic regurgitation    Aortic valve insufficiency    Ascending aortic aneurysm (HCC) 09/11/2016   Atherosclerotic vascular disease 05/27/2022   Atrial fibrillation (HCC)    Atrial fibrillation with RVR (HCC) 07/05/2022   BMI 27.0-27.9,adult 06/14/2022   BPH (benign prostatic hyperplasia)    Cardiomyopathy (HCC) 09/11/2016   CKD (chronic kidney disease) stage 2, GFR 60-89 ml/min 11/23/2021   Coronary artery calcification seen on CT scan 10/13/2017   Essential hypertension 06/14/2022   High risk medication use 07/31/2022   History of cardiomyopathy 11/09/2021   History of pulmonary embolism 09/24/2012   Overview:  Last Assessment & Plan:  Hx PE x 2 per notes, ? 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  - needs auto-titration study by [employer] or local company, data to RB to adjust his home device.    Prediabetes    Preoperative clearance 10/13/2017   Primary osteoarthritis involving multiple joints 06/14/2022   Prostate cancer metastatic to bone (HCC) 06/14/2022   Pulmonary embolism (HCC)    Rhinitis    Seasonal allergies 06/14/2022   Spondylolisthesis of lumbar region 12/12/2021   Stage 3a chronic kidney disease (HCC) 06/14/2022     Allergies Allergies  Allergen Reactions   Ciprofloxacin     Contraindicated because of  increased risk of rupture of ascending thoracic aortic aneurysm     Medications  Current Outpatient Medications:    acetaminophen (TYLENOL) 500 MG tablet, Take 500 mg by mouth every 6 (six) hours as needed for mild pain or moderate pain., Disp: , Rfl:    FARXIGA 5 MG TABS tablet, Take 1 tablet by mouth once daily, Disp: 90 tablet, Rfl: 0   HYDROcodone-acetaminophen (NORCO/VICODIN) 5-325 MG tablet, Take 1 tablet by mouth 3 (three) times daily., Disp: , Rfl:    KLOR-CON M20 20 MEQ tablet, Take 1 tablet (20  mEq total) by mouth daily., Disp: 90 tablet, Rfl: 3   metoprolol tartrate (LOPRESSOR) 50 MG tablet, TAKE 1 & 1/2 (ONE & ONE-HALF) TABLETS BY MOUTH TWICE DAILY, Disp: 180 tablet, Rfl: 0   Multiple Vitamin (MULTIVITAMINS PO), Take 1 tablet by mouth daily. Unknown strength, Disp: , Rfl:    rivaroxaban (XARELTO) 20 MG TABS tablet, Take 1 tablet (20 mg total) by mouth daily with supper., Disp: 90 tablet, Rfl: 2   rosuvastatin (CRESTOR) 5 MG tablet, Take 1 tablet (5 mg total) by mouth daily., Disp: 90 tablet, Rfl: 2   tamsulosin (FLOMAX) 0.4 MG CAPS capsule, TAKE 1 CAPSULE BY MOUTH TWICE DAILY 30  MINUTES  FOLLOWING  THE  SAME  MEAL  EACH  DAY, Disp: 180 capsule, Rfl: 0   torsemide (DEMADEX) 20 MG tablet, Take one tab po BID except on M/W/F take 2 tabs po in AM and 1 tab po in PM (Patient taking differently: Take 20 mg by mouth 2 (two) times daily. Take one tab po BID except on M/W/F take 2 tabs po in AM and 1 tab po in PM), Disp: 216 tablet, Rfl: 3   traMADol (ULTRAM) 50 MG tablet, Take 100 mg by mouth 3 (three) times daily., Disp: , Rfl:    UNABLE TO FIND, Take 2 tablets by mouth daily. Med Name: Curamin/ Unknown strength, Disp: , Rfl:    Review of Systems ROS     Objective:    Vitals BP (!) 120/58 (BP Location: Right Arm, Patient Position: Sitting, Cuff Size: Normal)   Pulse 95   Temp (!) 97.4 F (36.3 C)   Resp 18   Ht 5\' 10"  (1.778 m)   Wt 164 lb 4 oz (74.5 kg)   SpO2 98%   BMI 23.57  kg/m    Physical Examination Physical Exam     Assessment & Plan:   No problem-specific Assessment & Plan notes found for this encounter.    No follow-ups on file.   Crist Fat, MD

## 2023-03-15 NOTE — Progress Notes (Unsigned)
 Cardiology Office Note:    Date:  03/15/2023   ID:  Aaron Richmond, DOB Mar 04, 1945, MRN [id number]  PCP:  Crist Fat, MD  Cardiologist:  Norman Herrlich, MD    Referring MD: Crist Fat, MD    ASSESSMENT:    1. Permanent atrial fibrillation (HCC)   2. High risk medication use   3. Chronic anticoagulation   4. Hypertensive heart disease with heart failure (HCC)   5. Nonrheumatic aortic valve insufficiency   6. Aneurysm of ascending aorta without rupture (HCC)    PLAN:    In order of problems listed above:  ***   Next appointment: ***   Medication Adjustments/Labs and Tests Ordered: Current medicines are reviewed at length with the patient today.  Concerns regarding medicines are outlined above.  No orders of the defined types were placed in this encounter.  No orders of the defined types were placed in this encounter.    History of Present Illness:    Aaron Richmond is a 78 y.o. male with a hx of permanent atrial fibrillation with digoxin for rate control and chronic anticoagulation hypertensive heart disease with heart failure aortic regurgitation and aneurysm of the ascending aorta last seen 11/08/2022. Compliance with diet, lifestyle and medications: *** Past Medical History:  Diagnosis Date   Aneurysm of aortic root    Overview:  Last Assessment & Plan:  Planning to see Dr Tyrone Sage for evaluation of 5cm aneurysm.  - will check PFT in prep for possible procedure / SGY   Aortic atherosclerosis (HCC) 06/14/2022   Aortic regurgitation    Aortic valve insufficiency    Ascending aortic aneurysm (HCC) 09/11/2016   Atherosclerotic vascular disease 05/27/2022   Atrial fibrillation (HCC)    Atrial fibrillation with RVR (HCC) 07/05/2022   BMI 27.0-27.9,adult 06/14/2022   BPH (benign prostatic hyperplasia)    Cardiomyopathy (HCC) 09/11/2016   CKD (chronic kidney disease) stage 2, GFR 60-89 ml/min 11/23/2021   Coronary artery calcification seen on CT scan  10/13/2017   Essential hypertension 06/14/2022   High risk medication use 07/31/2022   History of cardiomyopathy 11/09/2021   History of pulmonary embolism 09/24/2012   Overview:  Last Assessment & Plan:  Hx PE x 2 per notes, ? 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  - needs auto-titration study by [employer] or local company, data to RB to adjust his home device.    Prediabetes    Preoperative clearance 10/13/2017   Primary osteoarthritis involving multiple joints 06/14/2022   Prostate cancer metastatic to bone (HCC) 06/14/2022   Pulmonary embolism (HCC)    Rhinitis    Seasonal allergies 06/14/2022   Spondylolisthesis of lumbar region 12/12/2021   Stage 3a chronic kidney disease (HCC) 06/14/2022    Current Medications: No outpatient medications have been marked as taking for the 03/17/23 encounter (Appointment) with Baldo Daub, MD.      EKGs/Labs/Other Studies Reviewed:  The following studies were reviewed today:  Cardiac Studies & Procedures   ______________________________________________________________________________________________   STRESS TESTS  MYOCARDIAL PERFUSION IMAGING 10/27/2017  Narrative  Nuclear stress EF: 49%.  There was no ST segment deviation noted during stress.  This is a low risk study.  The left ventricular ejection fraction is mildly decreased (45-54%).  Low risk stress  nuclear study with normal perfusion mildly dilated left ventricle with mildly reduced global systolic function. Findings suggest nonischemic cardiomyopathy.   ECHOCARDIOGRAM  ECHOCARDIOGRAM COMPLETE 10/09/2021  Narrative ECHOCARDIOGRAM REPORT    Patient Name:   Aaron Richmond Date of Exam: 10/09/2021 Medical Rec #:  [id number]        Height:       70.0 in Accession #:    [id number]       Weight:       214.0 lb Date of Birth:  1945/06/24        BSA:          2.148 m Patient Age:    78 years         BP:           120/60 mmHg Patient Gender: M                HR:           92 bpm. Exam Location:  [place]  Procedure: 2D Echo, Cardiac Doppler, Color Doppler and Strain Analysis  Indications:    Aortic valve insufficiency, etiology of cardiac valve disease unspecified [I35.1 (ICD-10-CM)]; History of pulmonary embolism [Z86.711 (ICD-10-CM)]; Cardiomyopathy, unspecified type (HCC) [I42.9 (ICD-10-CM)]  History:        Patient has prior history of Echocardiogram examinations, most recent 08/07/2021. Cardiomyopathy, CAD, Aortic Valve Disease, Arrythmias:Atrial Fibrillation; Risk Factors:Dyslipidemia. Ascending aortic aneurysm (HCC).  Sonographer:    Margreta Journey RDCS Referring Phys: Rito Ehrlich [facility]  IMPRESSIONS   1. GLS -10.0. Left ventricular ejection fraction, by estimation, is 50 to 55%. The left ventricle has low normal function. The left ventricle has no regional wall motion abnormalities. The left ventricular internal cavity size was mildly to moderately dilated. Left ventricular diastolic parameters are indeterminate. 2. Right ventricular systolic function is normal. The right ventricular size is normal. There is normal pulmonary artery systolic pressure. 3. Left atrial size was severely dilated. 4. The mitral valve is normal in structure. Mild to moderate mitral valve regurgitation. No evidence of mitral stenosis. 5. The aortic valve is normal in structure. Aortic valve  regurgitation is moderate. No aortic stenosis is present. 6. Aneurysm of the ascending aorta, measuring 51 mm. 7. The inferior vena cava is normal in size with greater than 50% respiratory variability, suggesting right atrial pressure of 3 mmHg.  FINDINGS Left Ventricle: GLS -10.0. Left ventricular ejection fraction, by estimation, is 50 to 55%. The left ventricle has low normal function. The left ventricle has no regional wall motion abnormalities. The left ventricular internal cavity size was mildly to moderately dilated. There is borderline left ventricular hypertrophy. Left ventricular diastolic parameters are indeterminate.  Right Ventricle: The right ventricular size is normal. No increase in right ventricular wall thickness. Right ventricular systolic function is normal. There is normal pulmonary artery systolic pressure. The tricuspid regurgitant velocity is 2.64 m/s, and with an assumed right atrial pressure of 8 mmHg, the estimated right ventricular systolic pressure is 35.9 mmHg.  Left Atrium: Left atrial size was severely dilated.  Right Atrium: Right atrial size was normal in size.  Pericardium: There is no  evidence of pericardial effusion.  Mitral Valve: The mitral valve is normal in structure. Mild to moderate mitral valve regurgitation. No evidence of mitral valve stenosis.  Tricuspid Valve: The tricuspid valve is normal in structure. Tricuspid valve regurgitation is mild . No evidence of tricuspid stenosis.  Aortic Valve: The aortic valve is normal in structure. Aortic valve regurgitation is moderate. Aortic regurgitation PHT measures 497 msec. No aortic stenosis is present. Aortic valve mean gradient measures 8.0 mmHg. Aortic valve peak gradient measures 13.4 mmHg. Aortic valve area, by VTI measures 3.74 cm.  Pulmonic Valve: The pulmonic valve was normal in structure. Pulmonic valve regurgitation is not visualized. No evidence of pulmonic stenosis.  Aorta: The aortic root  is normal in size and structure. There is an aneurysm involving the ascending aorta measuring 51 mm.  Venous: The inferior vena cava is normal in size with greater than 50% respiratory variability, suggesting right atrial pressure of 3 mmHg.  IAS/Shunts: No atrial level shunt detected by color flow Doppler.   LEFT VENTRICLE PLAX 2D LVIDd:         6.50 cm   Diastology LVIDs:         5.20 cm   LV e' medial:    6.31 cm/s LV PW:         1.10 cm   LV E/e' medial:  15.3 LV IVS:        1.10 cm   LV e' lateral:   13.90 cm/s LVOT diam:     2.50 cm   LV E/e' lateral: 7.0 LV SV:         138 LV SV Index:   64 LVOT Area:     4.91 cm   RIGHT VENTRICLE             IVC RV Basal diam:  3.40 cm     IVC diam: 2.20 cm RV Mid diam:    2.10 cm RV S prime:     10.90 cm/s TAPSE (M-mode): 2.3 cm  LEFT ATRIUM              Index        RIGHT ATRIUM           Index LA diam:        4.90 cm  2.28 cm/m   RA Area:     25.00 cm LA Vol (A2C):   67.4 ml  31.37 ml/m  RA Volume:   79.50 ml  37.01 ml/m LA Vol (A4C):   128.0 ml 59.58 ml/m LA Biplane Vol: 95.1 ml  44.27 ml/m AORTIC VALVE AV Area (Vmax):    3.27 cm AV Area (Vmean):   3.29 cm AV Area (VTI):     3.74 cm AV Vmax:           183.00 cm/s AV Vmean:          140.000 cm/s AV VTI:            0.370 m AV Peak Grad:      13.4 mmHg AV Mean Grad:      8.0 mmHg LVOT Vmax:         122.00 cm/s LVOT Vmean:        93.700 cm/s LVOT VTI:          0.282 m LVOT/AV VTI ratio: 0.76 AI PHT:            497 msec  AORTA Ao Root diam: 4.30 cm Ao Asc diam:  5.10 cm Ao Desc  diam: 2.60 cm  MR Peak grad:    98.0 mmHg    TRICUSPID VALVE MR Mean grad:    69.0 mmHg    TR Peak grad:   27.9 mmHg MR Vmax:         495.00 cm/s  TR Vmax:        264.00 cm/s MR Vmean:        397.0 cm/s MR PISA:         0.57 cm     SHUNTS MR PISA Eff ROA: 5 mm        Systemic VTI:  0.28 m MR PISA Radius:  0.30 cm      Systemic Diam: 2.50 cm MV E velocity: 96.70 cm/s  Gypsy Balsam MD Electronically signed by Gypsy Balsam MD Signature Date/Time: 10/09/2021/5:15:03 PM    Final          ______________________________________________________________________________________________          Recent Labs: 06/14/2022: TSH 1.400 11/08/2022: NT-Pro BNP 1,382 03/10/2023: ALT 11; BUN 18; Creatinine, Ser 1.24; Hemoglobin 12.2; Magnesium 2.3; Platelets 361; Potassium 4.2; Sodium 140  Recent Lipid Panel    Component Value Date/Time   CHOL 122 06/14/2022 1155   TRIG 85 06/14/2022 1155   HDL 45 06/14/2022 1155   CHOLHDL 2.7 06/14/2022 1155   LDLCALC 60 06/14/2022 1155    Physical Exam:    VS:  There were no vitals taken for this visit.    Wt Readings from Last 3 Encounters:  03/14/23 164 lb 4 oz (74.5 kg)  03/12/23 164 lb (74.4 kg)  03/10/23 164 lb (74.4 kg)     GEN: *** Well nourished, well developed in no acute distress HEENT: Normal NECK: No JVD; No carotid bruits LYMPHATICS: No lymphadenopathy CARDIAC: ***RRR, no murmurs, rubs, gallops RESPIRATORY:  Clear to auscultation without rales, wheezing or rhonchi  ABDOMEN: Soft, non-tender, non-distended MUSCULOSKELETAL:  No edema; No deformity  SKIN: Warm and dry NEUROLOGIC:  Alert and oriented x 3 PSYCHIATRIC:  Normal affect    Signed, Norman Herrlich, MD  03/15/2023 2:47 PM    [facility]

## 2023-03-17 ENCOUNTER — Encounter: Payer: Self-pay | Admitting: Cardiology

## 2023-03-17 ENCOUNTER — Ambulatory Visit: Attending: Cardiology | Admitting: Cardiology

## 2023-03-17 VITALS — BP 112/68 | HR 99 | Ht 69.0 in | Wt 165.8 lb

## 2023-03-17 DIAGNOSIS — Z7901 Long term (current) use of anticoagulants: Secondary | ICD-10-CM

## 2023-03-17 DIAGNOSIS — I351 Nonrheumatic aortic (valve) insufficiency: Secondary | ICD-10-CM

## 2023-03-17 DIAGNOSIS — I4821 Permanent atrial fibrillation: Secondary | ICD-10-CM | POA: Diagnosis not present

## 2023-03-17 DIAGNOSIS — Z79899 Other long term (current) drug therapy: Secondary | ICD-10-CM

## 2023-03-17 DIAGNOSIS — I7121 Aneurysm of the ascending aorta, without rupture: Secondary | ICD-10-CM | POA: Diagnosis not present

## 2023-03-17 DIAGNOSIS — I110 Hypertensive heart disease with heart failure: Secondary | ICD-10-CM | POA: Diagnosis not present

## 2023-03-17 NOTE — Patient Instructions (Signed)
Medication Instructions:  Your physician recommends that you continue on your current medications as directed. Please refer to the Current Medication list given to you today.  *If you need a refill on your cardiac medications before your next appointment, please call your pharmacy*   Lab Work: None If you have labs (blood work) drawn today and your tests are completely normal, you will receive your results only by: MyChart Message (if you have MyChart) OR A paper copy in the mail If you have any lab test that is abnormal or we need to change your treatment, we will call you to review the results.   Testing/Procedures: None   Follow-Up: At [facility], you and your health needs are our priority.  As part of our continuing mission to provide you with exceptional heart care, we have created designated Provider Care Teams.  These Care Teams include your primary Cardiologist (physician) and Advanced Practice Providers (APPs -  Physician Assistants and Nurse Practitioners) who all work together to provide you with the care you need, when you need it.  We recommend signing up for the patient portal called "MyChart".  Sign up information is provided on this After Visit Summary.  MyChart is used to connect with patients for Virtual Visits (Telemedicine).  Patients are able to view lab/test results, encounter notes, upcoming appointments, etc.  Non-urgent messages can be sent to your provider as well.   To learn more about what you can do with MyChart, go to [email or web address].    Your next appointment:   3 month(s)  Provider:   Brian Munley, MD    Other Instructions None  

## 2023-03-18 NOTE — Progress Notes (Signed)
 His labs look good.  Patient aware of labs

## 2023-03-22 ENCOUNTER — Other Ambulatory Visit: Payer: Self-pay | Admitting: Internal Medicine

## 2023-03-27 DIAGNOSIS — G4733 Obstructive sleep apnea (adult) (pediatric): Secondary | ICD-10-CM | POA: Diagnosis not present

## 2023-04-08 DIAGNOSIS — C61 Malignant neoplasm of prostate: Secondary | ICD-10-CM | POA: Diagnosis not present

## 2023-04-09 DIAGNOSIS — M545 Low back pain, unspecified: Secondary | ICD-10-CM | POA: Diagnosis not present

## 2023-04-09 DIAGNOSIS — M549 Dorsalgia, unspecified: Secondary | ICD-10-CM | POA: Diagnosis not present

## 2023-04-09 DIAGNOSIS — Z981 Arthrodesis status: Secondary | ICD-10-CM | POA: Diagnosis not present

## 2023-04-09 DIAGNOSIS — Z9889 Other specified postprocedural states: Secondary | ICD-10-CM | POA: Diagnosis not present

## 2023-04-09 DIAGNOSIS — M4726 Other spondylosis with radiculopathy, lumbar region: Secondary | ICD-10-CM | POA: Diagnosis not present

## 2023-04-11 ENCOUNTER — Encounter: Payer: Self-pay | Admitting: Internal Medicine

## 2023-04-11 ENCOUNTER — Ambulatory Visit: Admitting: Internal Medicine

## 2023-04-11 VITALS — BP 118/66 | HR 94 | Temp 98.6°F | Resp 17 | Ht 70.0 in | Wt 170.4 lb

## 2023-04-11 DIAGNOSIS — S31000A Unspecified open wound of lower back and pelvis without penetration into retroperitoneum, initial encounter: Secondary | ICD-10-CM

## 2023-04-11 DIAGNOSIS — S72001D Fracture of unspecified part of neck of right femur, subsequent encounter for closed fracture with routine healing: Secondary | ICD-10-CM

## 2023-04-11 DIAGNOSIS — S31000D Unspecified open wound of lower back and pelvis without penetration into retroperitoneum, subsequent encounter: Secondary | ICD-10-CM | POA: Diagnosis not present

## 2023-04-11 HISTORY — DX: Unspecified open wound of lower back and pelvis without penetration into retroperitoneum, initial encounter: S31.000A

## 2023-04-11 NOTE — Assessment & Plan Note (Signed)
 He is still having therapies at the home and he has been having some back spasms.  We will continue the medications the ER wrote.  He will probably need outpatient PT.

## 2023-04-11 NOTE — Progress Notes (Signed)
 Office Visit  Subjective   Patient ID: Aaron Richmond   DOB: [birth year]   Age: 78 y.o.   MRN: [id number]   Chief Complaint Chief Complaint  Patient presents with   Follow-up     History of Present Illness Aaron Richmond is a 78 yo male who returns today for hospital followup where I saw him a month ago and wanted to see him back to see how well he was doing with therapies and to look at his sacral wound.   He was brought into [facility] ER on 01/26/2023 where he sustained a fall.  He was unable to bear weight on her right side.  A xray at that time showed an acute right femoral neck fracture.  He underwent a right hip hemiarthroplasty on 01/27/2023.  Postoperatively he spiked a temperature and went into A. Fib with RVR.  He was started on iv Antbiotics and he had hypotension requiring IVF's.  He had some acute blood loss anemia but did not require a blood transfusion.  The patient was discharged on 02/02/2023 and transferred to [facility] for further rehab.  During his stay at [facility], he was sent out acutely to the ER due to A. Fib with RVR on 02/08/2023.  He was given medications and his HR improved and he sent back to [facility].  The patient said he continued to rehab him but he had some problems with BP being low.  He states they did not change any of his medications.  He was discharged from [facility] on 03/04/2023 and homehealth has been ordered as well as wound as he developed a sacral decubitus while he was at [facility].  His wife did call my partner last month as homehealth noted his BP readings showed left arm 70/40 and right arm 80/40. He asked the patient to drink plenty of fluids and hold his BP meds until he comes to see me today.  He held the metoprolol 50mg  BID and his entresto 24/26mg  BID and continued on his toresemide 20mg  po BID except on M/W/F he takes 40mg  in AM and 20mg  in PM.  He did see Aaron Richmond in followup on 03/17/2023.  He wanted to continue on his metoprolol 50mg  BID for rate control of his A. Fib and  again his entresto was discontinued.  Today, he is still working with homehealth therapies.  He is using a rolling walker.  Homehealth is seeing him twice a week for wound care and they have been measuring this and it is smaller.  Today, he denies any chest pain, palpitations, SOB, peripheral edema, generalized weakness or other problems.  Anticoagulation status: He remains on lifelong xarelto therapy per cardiology. CHADVASC2 score is 3.  He denies any bruising, melena, BRBPR or other bleeding problems.  He was in the ER 2 days ago due to worsening back pain.  They did xrays and his wife said there was no bone changes.  They felt that he was having muscle spasms and sent him home on lidocaine patches, methocarbamol and naproxen.  His back pain is improved.     Past Medical History Past Medical History:  Diagnosis Date   Aneurysm of aortic root    Overview:  Last Assessment & Plan:  Planning to see Dr Tyrone Sage for evaluation of 5cm aneurysm.  - will check PFT in prep for possible procedure / SGY   Aortic atherosclerosis (HCC) 06/14/2022   Aortic regurgitation    Aortic valve insufficiency    Ascending aortic aneurysm (  HCC) 09/11/2016   Atherosclerotic vascular disease 05/27/2022   Atrial fibrillation (HCC)    Atrial fibrillation with RVR (HCC) 07/05/2022   BMI 27.0-27.9,adult 06/14/2022   BPH (benign prostatic hyperplasia)    Cardiomyopathy (HCC) 09/11/2016   CKD (chronic kidney disease) stage 2, GFR 60-89 ml/min 11/23/2021   Coronary artery calcification seen on CT scan 10/13/2017   Essential hypertension 06/14/2022   High risk medication use 07/31/2022   History of cardiomyopathy 11/09/2021   History of pulmonary embolism 09/24/2012   Overview:  Last Assessment & Plan:  Hx PE x 2 per notes, ? 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  - needs auto-titration study by [employer] or local company, data to [facility] to adjust his home device.    Prediabetes    Preoperative clearance 10/13/2017   Primary osteoarthritis involving multiple joints 06/14/2022   Prostate cancer metastatic to bone (HCC) 06/14/2022   Pulmonary embolism (HCC)    Rhinitis    Seasonal allergies 06/14/2022   Spondylolisthesis of lumbar region 12/12/2021   Stage 3a chronic kidney disease (HCC) 06/14/2022     Allergies Allergies  Allergen Reactions   Ciprofloxacin     Contraindicated because of increased risk of rupture of ascending thoracic aortic aneurysm     Medications  Current Outpatient Medications:    methocarbamol (ROBAXIN) 750 MG tablet, Take 750 mg by mouth 3 (three) times daily., Disp: , Rfl:    naproxen (NAPROSYN) 500 MG tablet, Take 500 mg by mouth 2 (two) times daily., Disp: , Rfl:    acetaminophen (TYLENOL) 500 MG tablet, Take 500 mg by mouth every 6 (six) hours as needed for mild pain or moderate pain., Disp: , Rfl:    FARXIGA 5 MG TABS tablet, Take 1 tablet by mouth once daily, Disp: 90 tablet, Rfl: 0   metoprolol tartrate (LOPRESSOR) 50 MG tablet, TAKE 1 & 1/2 (ONE & ONE-HALF) TABLETS BY MOUTH TWICE DAILY, Disp: 180 tablet, Rfl: 0   Multiple Vitamin (MULTIVITAMINS PO), Take 1 tablet by mouth daily. Unknown strength, Disp: , Rfl:    rivaroxaban (XARELTO) 20 MG TABS tablet, Take 1 tablet (20 mg total) by mouth daily with supper., Disp: 90 tablet, Rfl: 2   rosuvastatin (CRESTOR) 5 MG tablet, Take 1 tablet (5 mg total) by mouth  daily., Disp: 90 tablet, Rfl: 2   tamsulosin (FLOMAX) 0.4 MG CAPS capsule, TAKE 1 CAPSULE BY MOUTH TWICE DAILY 30 MINUTES FOLLOWING  THE  SAME  MEAL  EACH  DAY, Disp: 180 capsule, Rfl: 0   torsemide (DEMADEX) 20 MG tablet, Take one tab po BID except on M/W/F take 2 tabs po in AM and 1 tab po in PM, Disp: 216 tablet, Rfl: 3   traMADol (ULTRAM) 50 MG tablet, Take 100 mg by mouth 3 (three) times daily., Disp: , Rfl:    UNABLE TO FIND, Take 2 tablets by mouth daily. Med Name: Curamin/ Unknown strength, Disp: , Rfl:  Review of Systems Review of Systems  Constitutional:  Negative for chills and fever.  Eyes:  Negative for blurred vision and double vision.  Respiratory:  Negative for shortness of breath.   Cardiovascular:  Negative for chest pain, palpitations and leg swelling.  Gastrointestinal:  Negative for abdominal pain, constipation, diarrhea, nausea and vomiting.  Neurological:  Negative for dizziness, weakness and headaches.       Objective:    Vitals BP 118/66   Pulse 94   Temp 98.6 F (37 C)   Resp 17   Ht 5\' 10"  (1.778 m)   Wt 170 lb 6.4 oz (77.3 kg)   SpO2 94%   BMI 24.45 kg/m    Physical Examination Physical Exam Constitutional:      Appearance: Normal appearance. He is not ill-appearing.  Cardiovascular:     Rate and Rhythm: Normal rate and regular rhythm.     Pulses: Normal pulses.     Heart sounds: No murmur heard.    No friction rub. No gallop.  Pulmonary:     Effort: Pulmonary effort is normal. No respiratory distress.     Breath sounds: No wheezing, rhonchi or rales.  Abdominal:     General: Abdomen is flat. Bowel sounds are normal. There is no distension.     Palpations: Abdomen is soft.     Tenderness: There is no abdominal tenderness.  Musculoskeletal:     Right lower leg: No edema.     Left lower leg: No edema.  Skin:    General: Skin is warm and dry.     Findings: No rash.     Comments: His sacral wound is smaller that his last visit and there  is granulation tissue at the wound base.  Neurological:     Mental Status: He is alert.        Assessment & Plan:   Closed fracture of right hip [facility]) He is still having therapies at the home and he has been having some back spasms.  We will continue the medications the ER wrote.  He will probably need outpatient PT.  Sacral wound His wound is improving.  Homehealth wound therapy is following.      Return in about 3 months (around 07/11/2023).   Crist Fat, MD

## 2023-04-11 NOTE — Assessment & Plan Note (Signed)
 His wound is improving.  Homehealth wound therapy is following.

## 2023-04-16 DIAGNOSIS — C61 Malignant neoplasm of prostate: Secondary | ICD-10-CM | POA: Diagnosis not present

## 2023-04-16 DIAGNOSIS — C7951 Secondary malignant neoplasm of bone: Secondary | ICD-10-CM | POA: Diagnosis not present

## 2023-04-16 DIAGNOSIS — C775 Secondary and unspecified malignant neoplasm of intrapelvic lymph nodes: Secondary | ICD-10-CM | POA: Diagnosis not present

## 2023-04-22 DIAGNOSIS — S72001A Fracture of unspecified part of neck of right femur, initial encounter for closed fracture: Secondary | ICD-10-CM | POA: Diagnosis not present

## 2023-04-27 DIAGNOSIS — G4733 Obstructive sleep apnea (adult) (pediatric): Secondary | ICD-10-CM | POA: Diagnosis not present

## 2023-05-02 ENCOUNTER — Telehealth: Payer: Self-pay

## 2023-05-02 NOTE — Telephone Encounter (Signed)
 Phone call with [facility].

## 2023-05-20 ENCOUNTER — Ambulatory Visit: Admitting: Internal Medicine

## 2023-05-20 ENCOUNTER — Encounter: Payer: Self-pay | Admitting: Internal Medicine

## 2023-05-20 VITALS — BP 114/62 | HR 95 | Temp 98.1°F | Resp 17 | Ht 69.0 in | Wt 170.8 lb

## 2023-05-20 DIAGNOSIS — G8929 Other chronic pain: Secondary | ICD-10-CM | POA: Insufficient documentation

## 2023-05-20 DIAGNOSIS — M545 Low back pain, unspecified: Secondary | ICD-10-CM | POA: Insufficient documentation

## 2023-05-20 DIAGNOSIS — K922 Gastrointestinal hemorrhage, unspecified: Secondary | ICD-10-CM | POA: Insufficient documentation

## 2023-05-20 DIAGNOSIS — K625 Hemorrhage of anus and rectum: Secondary | ICD-10-CM | POA: Diagnosis not present

## 2023-05-20 HISTORY — DX: Hemorrhage of anus and rectum: K62.5

## 2023-05-20 HISTORY — DX: Gastrointestinal hemorrhage, unspecified: K92.2

## 2023-05-20 MED ORDER — TRAMADOL HCL 50 MG PO TABS: 100.0000 mg | ORAL_TABLET | Freq: Three times a day (TID) | ORAL | 1 refills | Status: DC | PRN

## 2023-05-20 MED ORDER — RIVAROXABAN 10 MG PO TABS: 10.0000 mg | ORAL_TABLET | Freq: Every day | ORAL | 0 refills | Status: DC

## 2023-05-20 NOTE — Assessment & Plan Note (Signed)
 On my exam, his sacral wound is more lateral to his anus and there is no bleeding.  He has no hemorrhoids on exam.  He was grossly positive for blood on his rectal exam and was positive by stool guaic.  His vitals are stable and the patient clinicallly looks good.  I am going to get a stat hemoglobin and this will tell us  if he needs to go to the ER or get put in GI clinic as outpatient workup.  I am going to cut his xarelto  to 10mg  daily for the short term.  If he is stable with his hemoglobin, he is to monitor for any worsening bleeding and go to the ER if this occurs.

## 2023-05-20 NOTE — Assessment & Plan Note (Signed)
 The patient has a history of chronic pain and had surgery done in 01/2022.  He has seen [facility] but they are going for a second opinion to [facility].  He is having severe back pain at times.  We will write him for tramadol.

## 2023-05-20 NOTE — Assessment & Plan Note (Signed)
 Plan as below.

## 2023-05-20 NOTE — Progress Notes (Signed)
 Office Visit  Subjective   Patient ID: Aaron Richmond   DOB: [birth year]   Age: 78 y.o.   MRN: [id number]   Chief Complaint Chief Complaint  Patient presents with   Acute Visit    Bleeding with wiping after stool     History of Present Illness Aaron Richmond is a 78 yo male who comes in today for an acute visit due to BRBPR.  He states that on Friday he noted that he had BRBPR when he had a bowel movement.  He states there is some blood in toilet water but not a lot of blood.  He does not have a history of hemorrhoids.  He was recently hospitalized per prior notes and had developed a sacral decubitus while he was at [facility].  His skin is irritated but the wound is healing.  There is sometimes blood from this wound.  There is no abdominal pain or rectal pain.  He states that his BRBPR is worse in the morning but clears up by the evening.  He had some acute blood loss anemia while he was hospitalized but did not require a blood transfusion.  He is having normal BM's and he is not straining.  Aaron Richmond was also diagnosed with a pulmonary embolism in 2008.  He was treated several months with anticoagulation.  They were never able to find out why he had a PE but he believes he was on a long plane ride and this may have been the cause.  There has never been any other history of blood clots.  There is no SOB at this time.  He is on lifelong Xarelto  therapy at this time for both his PE and history of Atrial Fib.  Today, he denies any chest pain, palpitations, SOB, generalized weakness, dizziness or other problems.     Past Medical History Past Medical History:  Diagnosis Date   Aneurysm of aortic root    Overview:  Last Assessment & Plan:  Planning to see Dr Nicanor Barge for evaluation of 5cm aneurysm.  - will check PFT in prep for possible procedure / SGY   Aortic atherosclerosis (HCC) 06/14/2022   Aortic regurgitation    Aortic valve insufficiency    Ascending aortic aneurysm (HCC)  09/11/2016   Atherosclerotic vascular disease 05/27/2022   Atrial fibrillation (HCC)    Atrial fibrillation with RVR (HCC) 07/05/2022   BMI 27.0-27.9,adult 06/14/2022   BPH (benign prostatic hyperplasia)    Cardiomyopathy (HCC) 09/11/2016   CKD (chronic kidney disease) stage 2, GFR 60-89 ml/min 11/23/2021   Coronary artery calcification seen on CT scan 10/13/2017   Essential hypertension 06/14/2022   High risk medication use 07/31/2022   History of cardiomyopathy 11/09/2021   History of pulmonary embolism 09/24/2012   Overview:  Last Assessment & Plan:  Hx PE x 2 per notes, ? 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  - needs auto-titration study by [employer] or  local company, data to RB to adjust his home device.    Prediabetes    Preoperative clearance 10/13/2017   Primary osteoarthritis involving multiple joints 06/14/2022   Prostate cancer metastatic to bone (HCC) 06/14/2022   Pulmonary embolism (HCC)    Rhinitis    Seasonal allergies 06/14/2022   Spondylolisthesis of lumbar region 12/12/2021   Stage 3a chronic kidney disease (HCC) 06/14/2022     Allergies Allergies  Allergen Reactions   Ciprofloxacin     Contraindicated because of increased risk of rupture of ascending thoracic aortic aneurysm     Medications  Current Outpatient Medications:    acetaminophen  (TYLENOL ) 500 MG tablet, Take 500 mg by mouth  every 6 (six) hours as needed for mild pain or moderate pain., Disp: , Rfl:    FARXIGA  5 MG TABS tablet, Take 1 tablet by mouth once daily, Disp: 90 tablet, Rfl: 0   methocarbamol  (ROBAXIN ) 750 MG tablet, Take 750 mg by mouth 3 (three) times daily., Disp: , Rfl:    metoprolol  tartrate (LOPRESSOR ) 50 MG tablet, TAKE 1 & 1/2 (ONE & ONE-HALF) TABLETS BY MOUTH TWICE DAILY, Disp: 180 tablet, Rfl: 0   Multiple Vitamin (MULTIVITAMINS PO), Take 1 tablet by mouth daily. Unknown strength, Disp: , Rfl:    naproxen (NAPROSYN) 500 MG tablet, Take 500 mg by mouth 2 (two) times daily., Disp: , Rfl:    rivaroxaban  (XARELTO ) 20 MG TABS tablet, Take 1 tablet (20 mg total) by mouth daily with supper., Disp: 90 tablet, Rfl: 2   rosuvastatin  (CRESTOR ) 5 MG tablet, Take 1 tablet (5 mg total) by mouth daily., Disp: 90 tablet, Rfl: 2   tamsulosin (FLOMAX) 0.4 MG CAPS capsule, TAKE 1 CAPSULE BY MOUTH TWICE DAILY 30 MINUTES FOLLOWING  THE  SAME  MEAL  EACH  DAY, Disp: 180 capsule, Rfl: 0   torsemide  (DEMADEX ) 20 MG tablet, Take one tab po BID except on M/W/F take 2 tabs po in AM and 1 tab po in PM, Disp: 216 tablet, Rfl: 3   traMADol (ULTRAM) 50 MG tablet, Take 100 mg by mouth 3 (three) times daily., Disp: , Rfl:    UNABLE TO FIND, Take 2 tablets by mouth daily. Med Name: Curamin/ Unknown strength, Disp: , Rfl:    Review of Systems Review of Systems  Constitutional:  Negative for chills and fever.  Eyes:  Negative for blurred vision and double vision.  Respiratory:  Negative for shortness of breath.   Cardiovascular:  Negative for chest pain, palpitations and leg swelling.  Gastrointestinal:  Positive for blood in stool. Negative for abdominal pain, constipation, diarrhea, melena, nausea and vomiting.  Neurological:  Negative for dizziness, weakness and headaches.       Objective:    Vitals BP 114/62   Pulse 95   Temp 98.1 F (36.7 C)   Resp 17   Ht 5\' 9"  (1.753 m)   Wt 170 lb 12.8 oz (77.5 kg)   SpO2 95%    BMI 25.22 kg/m    Physical Examination Physical Exam Constitutional:      Appearance: Normal appearance. He is not ill-appearing.  Cardiovascular:     Rate and Rhythm: Normal rate and regular rhythm.     Pulses: Normal pulses.     Heart sounds: No murmur heard.    No friction rub. No gallop.  Pulmonary:     Effort: Pulmonary effort is normal. No respiratory distress.     Breath sounds: No wheezing, rhonchi or rales.  Abdominal:  General: Abdomen is flat. Bowel sounds are normal. There is no distension.     Palpations: Abdomen is soft.     Tenderness: There is no abdominal tenderness.  Musculoskeletal:     Right lower leg: No edema.     Left lower leg: No edema.  Skin:    General: Skin is warm and dry.     Findings: No rash.  Neurological:     Mental Status: He is alert.        Assessment & Plan:   Gastrointestinal hemorrhage On my exam, his sacral wound is more lateral to his anus and there is no bleeding.  He has no hemorrhoids on exam.  He was grossly positive for blood on his rectal exam and was positive by stool guaic.  His vitals are stable and the patient clinicallly looks good.  I am going to get a stat hemoglobin and this will tell us  if he needs to go to the ER or get put in GI clinic as outpatient workup.  I am going to cut his xarelto  to 10mg  daily for the short term.  If he is stable with his hemoglobin, he is to monitor for any worsening bleeding and go to the ER if this occurs.    No follow-ups on file.   Wayne Haines, MD

## 2023-05-21 ENCOUNTER — Other Ambulatory Visit: Payer: Self-pay

## 2023-05-21 ENCOUNTER — Inpatient Hospital Stay (HOSPITAL_COMMUNITY)
Admission: EM | Admit: 2023-05-21 | Discharge: 2023-05-23 | DRG: 378 | Disposition: A | Discharge status: Home / Self Care | Attending: Internal Medicine | Admitting: Internal Medicine

## 2023-05-21 ENCOUNTER — Encounter (HOSPITAL_COMMUNITY): Payer: Self-pay

## 2023-05-21 ENCOUNTER — Emergency Department (HOSPITAL_COMMUNITY)

## 2023-05-21 ENCOUNTER — Other Ambulatory Visit: Payer: Self-pay | Admitting: Internal Medicine

## 2023-05-21 DIAGNOSIS — G4733 Obstructive sleep apnea (adult) (pediatric): Secondary | ICD-10-CM | POA: Diagnosis not present

## 2023-05-21 DIAGNOSIS — M5416 Radiculopathy, lumbar region: Secondary | ICD-10-CM | POA: Diagnosis not present

## 2023-05-21 DIAGNOSIS — Z6825 Body mass index (BMI) 25.0-25.9, adult: Secondary | ICD-10-CM

## 2023-05-21 DIAGNOSIS — Z820 Family history of epilepsy and other diseases of the nervous system: Secondary | ICD-10-CM

## 2023-05-21 DIAGNOSIS — N400 Enlarged prostate without lower urinary tract symptoms: Secondary | ICD-10-CM | POA: Diagnosis not present

## 2023-05-21 DIAGNOSIS — I080 Rheumatic disorders of both mitral and aortic valves: Secondary | ICD-10-CM | POA: Diagnosis present

## 2023-05-21 DIAGNOSIS — Z8249 Family history of ischemic heart disease and other diseases of the circulatory system: Secondary | ICD-10-CM

## 2023-05-21 DIAGNOSIS — E669 Obesity, unspecified: Secondary | ICD-10-CM | POA: Diagnosis not present

## 2023-05-21 DIAGNOSIS — I2510 Atherosclerotic heart disease of native coronary artery without angina pectoris: Secondary | ICD-10-CM | POA: Diagnosis present

## 2023-05-21 DIAGNOSIS — E7800 Pure hypercholesterolemia, unspecified: Secondary | ICD-10-CM | POA: Diagnosis not present

## 2023-05-21 DIAGNOSIS — Z86718 Personal history of other venous thrombosis and embolism: Secondary | ICD-10-CM | POA: Diagnosis not present

## 2023-05-21 DIAGNOSIS — I129 Hypertensive chronic kidney disease with stage 1 through stage 4 chronic kidney disease, or unspecified chronic kidney disease: Secondary | ICD-10-CM | POA: Diagnosis not present

## 2023-05-21 DIAGNOSIS — E869 Volume depletion, unspecified: Secondary | ICD-10-CM | POA: Diagnosis present

## 2023-05-21 DIAGNOSIS — Z823 Family history of stroke: Secondary | ICD-10-CM

## 2023-05-21 DIAGNOSIS — N1831 Chronic kidney disease, stage 3a: Secondary | ICD-10-CM | POA: Diagnosis present

## 2023-05-21 DIAGNOSIS — R7303 Prediabetes: Secondary | ICD-10-CM | POA: Diagnosis present

## 2023-05-21 DIAGNOSIS — K5730 Diverticulosis of large intestine without perforation or abscess without bleeding: Secondary | ICD-10-CM | POA: Diagnosis present

## 2023-05-21 DIAGNOSIS — I7121 Aneurysm of the ascending aorta, without rupture: Secondary | ICD-10-CM | POA: Diagnosis not present

## 2023-05-21 DIAGNOSIS — K921 Melena: Secondary | ICD-10-CM | POA: Diagnosis not present

## 2023-05-21 DIAGNOSIS — K922 Gastrointestinal hemorrhage, unspecified: Secondary | ICD-10-CM | POA: Diagnosis not present

## 2023-05-21 DIAGNOSIS — K625 Hemorrhage of anus and rectum: Secondary | ICD-10-CM | POA: Diagnosis not present

## 2023-05-21 DIAGNOSIS — D62 Acute posthemorrhagic anemia: Secondary | ICD-10-CM | POA: Diagnosis present

## 2023-05-21 DIAGNOSIS — I429 Cardiomyopathy, unspecified: Secondary | ICD-10-CM | POA: Diagnosis not present

## 2023-05-21 DIAGNOSIS — Z881 Allergy status to other antibiotic agents status: Secondary | ICD-10-CM

## 2023-05-21 DIAGNOSIS — Z981 Arthrodesis status: Secondary | ICD-10-CM | POA: Diagnosis not present

## 2023-05-21 DIAGNOSIS — Z8546 Personal history of malignant neoplasm of prostate: Secondary | ICD-10-CM

## 2023-05-21 DIAGNOSIS — K648 Other hemorrhoids: Secondary | ICD-10-CM | POA: Diagnosis not present

## 2023-05-21 DIAGNOSIS — Z86711 Personal history of pulmonary embolism: Secondary | ICD-10-CM

## 2023-05-21 DIAGNOSIS — N3289 Other specified disorders of bladder: Secondary | ICD-10-CM | POA: Diagnosis not present

## 2023-05-21 DIAGNOSIS — Z923 Personal history of irradiation: Secondary | ICD-10-CM

## 2023-05-21 DIAGNOSIS — Z833 Family history of diabetes mellitus: Secondary | ICD-10-CM

## 2023-05-21 DIAGNOSIS — I4819 Other persistent atrial fibrillation: Secondary | ICD-10-CM

## 2023-05-21 DIAGNOSIS — I4891 Unspecified atrial fibrillation: Secondary | ICD-10-CM | POA: Diagnosis not present

## 2023-05-21 DIAGNOSIS — K920 Hematemesis: Secondary | ICD-10-CM | POA: Diagnosis not present

## 2023-05-21 DIAGNOSIS — Z7901 Long term (current) use of anticoagulants: Secondary | ICD-10-CM | POA: Diagnosis not present

## 2023-05-21 DIAGNOSIS — Z79899 Other long term (current) drug therapy: Secondary | ICD-10-CM

## 2023-05-21 LAB — COMPREHENSIVE METABOLIC PANEL WITH GFR
ALT: 11 U/L (ref 0–44)
AST: 33 U/L (ref 15–41)
Albumin: 3.6 g/dL (ref 3.5–5.0)
Alkaline Phosphatase: 62 U/L (ref 38–126)
Anion gap: 10 (ref 5–15)
BUN: 20 mg/dL (ref 8–23)
CO2: 27 mmol/L (ref 22–32)
Calcium: 9.6 mg/dL (ref 8.9–10.3)
Chloride: 101 mmol/L (ref 98–111)
Creatinine, Ser: 1.31 mg/dL — ABNORMAL HIGH (ref 0.61–1.24)
GFR, Estimated: 56 mL/min — ABNORMAL LOW (ref >60)
Glucose, Bld: 107 mg/dL — ABNORMAL HIGH (ref 70–99)
Potassium: 4.7 mmol/L (ref 3.5–5.1)
Sodium: 138 mmol/L (ref 135–145)
Total Bilirubin: 1.5 mg/dL — ABNORMAL HIGH (ref 0.0–1.2)
Total Protein: 7 g/dL (ref 6.5–8.1)

## 2023-05-21 LAB — IRON AND TIBC
Iron: 51 ug/dL (ref 45–182)
Saturation Ratios: 13 % — ABNORMAL LOW (ref 17.9–39.5)
TIBC: 407 ug/dL (ref 250–450)
UIBC: 356 ug/dL

## 2023-05-21 LAB — PROTIME-INR
INR: 1.6 — ABNORMAL HIGH (ref 0.8–1.2)
Prothrombin Time: 19 s — ABNORMAL HIGH (ref 11.4–15.2)

## 2023-05-21 LAB — FOLATE: Folate: >40 ng/mL (ref >5.9)

## 2023-05-21 LAB — FERRITIN: Ferritin: 23 ng/mL — ABNORMAL LOW (ref 24–336)

## 2023-05-21 LAB — CBC
HCT: 33.7 % — ABNORMAL LOW (ref 39.0–52.0)
Hemoglobin: 10.5 g/dL — ABNORMAL LOW (ref 13.0–17.0)
MCH: 30.9 pg (ref 26.0–34.0)
MCHC: 31.2 g/dL (ref 30.0–36.0)
MCV: 99.1 fL (ref 80.0–100.0)
Platelets: 217 10*3/uL (ref 150–400)
RBC: 3.4 MIL/uL — ABNORMAL LOW (ref 4.22–5.81)
RDW: 15.4 % (ref 11.5–15.5)
WBC: 8.5 10*3/uL (ref 4.0–10.5)
nRBC: 0 % (ref 0.0–0.2)

## 2023-05-21 LAB — ABO/RH: ABO/RH(D): O POS

## 2023-05-21 LAB — TYPE AND SCREEN
ABO/RH(D): O POS
Antibody Screen: NEGATIVE

## 2023-05-21 LAB — VITAMIN B12: Vitamin B-12: 6352 pg/mL — ABNORMAL HIGH (ref 180–914)

## 2023-05-21 LAB — RETICULOCYTES
Immature Retic Fract: 18 % — ABNORMAL HIGH (ref 2.3–15.9)
RBC.: 3.53 MIL/uL — ABNORMAL LOW (ref 4.22–5.81)
Retic Count, Absolute: 69.5 10*3/uL (ref 19.0–186.0)
Retic Ct Pct: 2 % (ref 0.4–3.1)

## 2023-05-21 MED ORDER — DAPAGLIFLOZIN PROPANEDIOL 5 MG PO TABS
5.0000 mg | ORAL_TABLET | Freq: Every day | ORAL | Status: DC
  Filled 2023-05-21: qty 1

## 2023-05-21 MED ORDER — TRAMADOL HCL 50 MG PO TABS
50.0000 mg | ORAL_TABLET | Freq: Four times a day (QID) | ORAL | Status: DC | PRN
  Administered 2023-05-21 – 2023-05-23 (×4): 50 mg via ORAL
  Filled 2023-05-21 (×4): qty 1

## 2023-05-21 MED ORDER — PANTOPRAZOLE SODIUM 40 MG IV SOLR
40.0000 mg | Freq: Two times a day (BID) | INTRAVENOUS | Status: DC
  Administered 2023-05-21 – 2023-05-23 (×4): 40 mg via INTRAVENOUS
  Filled 2023-05-21 (×4): qty 10

## 2023-05-21 MED ORDER — VITAMIN B-12 1000 MCG PO TABS
5000.0000 ug | ORAL_TABLET | Freq: Every day | ORAL | Status: DC
  Administered 2023-05-22 – 2023-05-23 (×2): 5000 ug via ORAL
  Filled 2023-05-21 (×2): qty 5

## 2023-05-21 MED ORDER — IOHEXOL 350 MG/ML SOLN
100.0000 mL | Freq: Once | INTRAVENOUS | Status: AC | PRN
  Administered 2023-05-21: 100 mL via INTRAVENOUS

## 2023-05-21 MED ORDER — ACETAMINOPHEN 325 MG PO TABS: 650.0000 mg | ORAL_TABLET | Freq: Four times a day (QID) | ORAL | Status: DC | PRN

## 2023-05-21 MED ORDER — ROSUVASTATIN CALCIUM 5 MG PO TABS
5.0000 mg | ORAL_TABLET | Freq: Every day | ORAL | Status: DC
  Administered 2023-05-22 – 2023-05-23 (×2): 5 mg via ORAL
  Filled 2023-05-21 (×2): qty 1

## 2023-05-21 MED ORDER — ACETAMINOPHEN 650 MG RE SUPP: 650.0000 mg | Freq: Four times a day (QID) | RECTAL | Status: DC | PRN

## 2023-05-21 MED ORDER — LACTATED RINGERS IV BOLUS
1000.0000 mL | Freq: Once | INTRAVENOUS | Status: AC
  Administered 2023-05-21: 1000 mL via INTRAVENOUS

## 2023-05-21 MED ORDER — METOPROLOL TARTRATE 50 MG PO TABS
75.0000 mg | ORAL_TABLET | Freq: Two times a day (BID) | ORAL | Status: DC
  Administered 2023-05-21 – 2023-05-23 (×4): 75 mg via ORAL
  Filled 2023-05-21 (×4): qty 1

## 2023-05-21 MED ORDER — SENNOSIDES-DOCUSATE SODIUM 8.6-50 MG PO TABS
2.0000 | ORAL_TABLET | Freq: Two times a day (BID) | ORAL | Status: DC
  Administered 2023-05-21 – 2023-05-22 (×2): 2 via ORAL
  Filled 2023-05-21 (×2): qty 2

## 2023-05-21 MED ORDER — TORSEMIDE 20 MG PO TABS: 20.0000 mg | ORAL_TABLET | Freq: Two times a day (BID) | ORAL | Status: DC

## 2023-05-21 MED ORDER — TAMSULOSIN HCL 0.4 MG PO CAPS
0.4000 mg | ORAL_CAPSULE | Freq: Every day | ORAL | Status: DC
  Administered 2023-05-22: 0.4 mg via ORAL
  Filled 2023-05-21: qty 1

## 2023-05-21 MED ORDER — POLYETHYLENE GLYCOL 3350 17 G PO PACK: 17.0000 g | PACK | Freq: Every day | ORAL | Status: DC | PRN

## 2023-05-21 MED ORDER — TAMSULOSIN HCL 0.4 MG PO CAPS: 0.4000 mg | ORAL_CAPSULE | Freq: Every day | ORAL | Status: DC

## 2023-05-21 MED ORDER — PANTOPRAZOLE SODIUM 40 MG IV SOLR
40.0000 mg | Freq: Once | INTRAVENOUS | Status: AC
Start: 2023-05-21 — End: 2023-05-21
  Administered 2023-05-21: 40 mg via INTRAVENOUS
  Filled 2023-05-21: qty 10

## 2023-05-21 MED ORDER — SODIUM CHLORIDE 0.9 % IV SOLN: INTRAVENOUS | Status: DC

## 2023-05-21 MED ORDER — SODIUM CHLORIDE 0.9% FLUSH
3.0000 mL | Freq: Two times a day (BID) | INTRAVENOUS | Status: DC
  Administered 2023-05-21 – 2023-05-22 (×3): 3 mL via INTRAVENOUS

## 2023-05-21 NOTE — H&P (Addendum)
 History and Physical    Patient: Aaron Richmond EXB:[id number] DOB: February 25, 1945 DOA: 05/21/2023 DOS: the patient was seen and examined on 05/21/2023 PCP: Wayne Haines, MD  Patient coming from: Home  Chief Complaint:  Chief Complaint  Patient presents with   Rectal Bleeding   HPI: Aaron Richmond is a 78 y.o. male with medical history significant of rectal issues as listed below including atrial fibrillation and a remote venous thromboembolism requiring patient to be on Xarelto .  Patient seems to have been in his usual state of health till about 4 days ago when he reports a new onset of rectal bleeding which was mixed with stool also separate from stool.  About 3 episodes a day which would stay in his undergarment.  Patient denies any hematuria gum bleeding nosebleeding hematemesis or any other site of bleeding.  Patient denies any chest pain shortness of breath or presyncope.  Patient saw his PCP yesterday who did rectal exam that is in chart with finding of bright red blood per rectum.  And no obvious external source of bleeding.  Patient was subsequently sent to the ER today.  Here in the ER patient vital signs are stable.  Patient has had a CT angiogram abdomen pelvis done.  No extravasation of contrast is noted.  Medical evaluation is sought.   Review of Systems: As mentioned in the history of present illness. All other systems reviewed and are negative. Past Medical History:  Diagnosis Date   Aneurysm of aortic root    Overview:  Last Assessment & Plan:  Planning to see Dr Nicanor Barge for evaluation of 5cm aneurysm.  - will check PFT in prep for possible procedure / SGY   Aortic atherosclerosis (HCC) 06/14/2022   Aortic regurgitation    Aortic valve insufficiency    Ascending aortic aneurysm (HCC) 09/11/2016   Atherosclerotic vascular disease 05/27/2022   Atrial fibrillation (HCC)    Atrial fibrillation with RVR (HCC) 07/05/2022   BMI 27.0-27.9,adult 06/14/2022   BPH (benign  prostatic hyperplasia)    Cardiomyopathy (HCC) 09/11/2016   CKD (chronic kidney disease) stage 2, GFR 60-89 ml/min 11/23/2021   Coronary artery calcification seen on CT scan 10/13/2017   Essential hypertension 06/14/2022   High risk medication use 07/31/2022   History of cardiomyopathy 11/09/2021   History of pulmonary embolism 09/24/2012   Overview:  Last Assessment & Plan:  Hx PE x 2 per notes, ? 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  - needs auto-titration study by [employer] or local company, data to RB to adjust his home device.    Prediabetes    Preoperative clearance 10/13/2017   Primary osteoarthritis involving multiple joints 06/14/2022   Prostate cancer metastatic to bone (HCC) 06/14/2022   Pulmonary embolism (HCC)    Rhinitis    Seasonal allergies 06/14/2022   Spondylolisthesis of lumbar region 12/12/2021   Stage 3a chronic kidney disease (HCC) 06/14/2022   Past Surgical History:  Procedure Laterality Date   APPENDECTOMY     HERNIA REPAIR     LUMBAR LAMINECTOMY  02/06/2022   Facetectom & foraminotomy for decompression of the cauda equina & nerve root L4-5, Aaron Humble, MD   TONSILLECTOMY     Social History:  reports that he has never smoked. He has never used smokeless tobacco. He reports that he does not drink alcohol  and does not use drugs.  Allergies  Allergen Reactions   Ciprofloxacin Other  (See Comments)    Contraindicated because of increased risk of rupture of ascending thoracic aortic aneurysm    Family History  Problem Relation Age of Onset   Stroke Mother    Alzheimer's disease Mother    Heart attack Mother    Stroke Father    Diabetes Mellitus II Father     Prior to Admission medications   Medication Sig Start Date End Date Taking? Authorizing Provider  acetaminophen  (TYLENOL ) 500 MG tablet Take 1,000 mg by mouth in the morning and at bedtime.   Yes [provider]  Cyanocobalamin (B-12) 5000 MCG CAPS Take 1 capsule by mouth daily at 12 noon.   Yes [provider]  ENTRESTO  24-26 MG Take 1 tablet by mouth 2 (two) times daily. 03/23/23  Yes [provider]  FARXIGA  5 MG TABS tablet Take 1 tablet by mouth once daily 03/03/23  Yes Wayne Haines, MD  KLOR-CON  M20 20 MEQ tablet Take 20 mEq by mouth daily. 03/30/23  Yes [provider]  metoprolol  tartrate (LOPRESSOR ) 50 MG tablet TAKE 1 & 1/2 (ONE & ONE-HALF) TABLETS BY MOUTH TWICE DAILY Patient taking differently: Take 75 mg by mouth 2 (two) times daily. 01/24/23  Yes Wayne Haines, MD  Misc Natural Products (ADV TURMERIC CURCUMIN COMPLEX) CAPS Take 2 capsules by mouth in the morning and at bedtime.   Yes [provider]  Multiple Vitamin (MULTIVITAMIN WITH MINERALS) TABS tablet Take 1 tablet by mouth daily.   Yes [provider]  rivaroxaban  (XARELTO ) 10 MG TABS tablet Take 1 tablet (10 mg total) by mouth daily for 10 days. 05/20/23 05/30/23 Yes Wayne Haines, MD  rosuvastatin  (CRESTOR ) 5 MG tablet Take 1 tablet (5 mg total) by mouth daily. 09/24/22  Yes Hassan Links, MD  tamsulosin (FLOMAX) 0.4 MG CAPS capsule TAKE 1 CAPSULE BY MOUTH TWICE DAILY 30 MINUTES FOLLOWING  THE  SAME  MEAL  EACH  DAY Patient taking differently: Take 0.4 mg by mouth in the morning and at bedtime. 03/24/23  Yes Wayne Haines, MD  torsemide  (DEMADEX ) 20 MG tablet Take one tab po BID except on  M/W/F take 2 tabs po in AM and 1 tab po in PM Patient taking differently: Take 20-40 mg by mouth 2 (two) times daily. Take one tablet 20 mg by mouth twice a day on Tuesday, Thursday, Saturday and Sunday. Take two tablets 40 mg by mouth twice a day on Monday, Wednesday and Friday. 09/13/22  Yes Wayne Haines, MD  traMADol (ULTRAM) 50 MG tablet Take 2 tablets (100 mg total) by mouth every 8 (eight) hours as needed. Patient taking differently: Take 100 mg by mouth every 8 (eight) hours as needed for moderate pain (pain score 4-6) or severe pain (pain score 7-10). 05/20/23  Yes Wayne Haines, MD  methocarbamol  (ROBAXIN ) 750 MG tablet Take 750 mg by mouth 3 (three) times daily. Patient not taking: Reported on 05/21/2023 04/09/23   [provider]  naproxen (NAPROSYN)  500 MG tablet Take 500 mg by mouth 2 (two) times daily. Patient not taking: Reported on 05/21/2023 04/09/23   [provider]  rivaroxaban  (XARELTO ) 20 MG TABS tablet Take 1 tablet (20 mg total) by mouth daily with supper. Patient not taking: Reported on 05/21/2023 12/19/22   Hassan Links, MD    Physical Exam: Vitals:   05/21/23 1126 05/21/23 1429 05/21/23 1554 05/21/23 1600  BP: (!) 109/40 (!) 120/48  130/66  Pulse: 65 84  99  Resp: 18 18    Temp: 99.1 F (37.3 C)  97.7 F (36.5 C)   TempSrc: Oral     SpO2:  96%  93%  Weight:      Height:       General: Patient is alert and awake appears to be in no distress gives a coherent account of his symptoms Respiratory exam: Bilateral air entry vesicular Cardiovascular exam S1-S2 normal Abdomen all quadrants soft nontender Extremities warm without edema Rectal exam is noted from the primary care office yesterday. "sacral wound is more lateral to his anus and there is no bleeding. He has no hemorrhoids on exam. He was grossly positive for blood on his rectal exam and was positive by stool guaic. " Data Reviewed:  Labs on Admission:  Results for orders placed or  performed during the hospital encounter of 05/21/23 (from the past 24 hours)  Comprehensive metabolic panel     Status: Abnormal   Collection Time: 05/21/23 11:25 AM  Result Value Ref Range   Sodium 138 135 - 145 mmol/L   Potassium 4.7 3.5 - 5.1 mmol/L   Chloride 101 98 - 111 mmol/L   CO2 27 22 - 32 mmol/L   Glucose, Bld 107 (H) 70 - 99 mg/dL   BUN 20 8 - 23 mg/dL   Creatinine, Ser 5.62 (H) 0.61 - 1.24 mg/dL   Calcium  9.6 8.9 - 10.3 mg/dL   Total Protein 7.0 6.5 - 8.1 g/dL   Albumin 3.6 3.5 - 5.0 g/dL   AST 33 15 - 41 U/L   ALT 11 0 - 44 U/L   Alkaline Phosphatase 62 38 - 126 U/L   Total Bilirubin 1.5 (H) 0.0 - 1.2 mg/dL   GFR, Estimated 56 (L) >60 mL/min   Anion gap 10 5 - 15  Type and screen [facility]     Status: None   Collection Time: 05/21/23  2:04 PM  Result Value Ref Range   ABO/RH(D) O POS    Antibody Screen NEG    Sample Expiration      05/24/2023,2359 Performed at [facility], [address] [place], [place] [address]   Protime-INR     Status: Abnormal   Collection Time: 05/21/23  2:04 PM  Result Value Ref Range   Prothrombin Time 19.0 (H) 11.4 - 15.2 seconds   INR 1.6 (H) 0.8 - 1.2  CBC     Status: Abnormal   Collection Time: 05/21/23  2:04 PM  Result Value Ref Range   WBC 8.5 4.0 - 10.5 K/uL   RBC 3.40 (L) 4.22 - 5.81 MIL/uL   Hemoglobin 10.5 (L) 13.0 - 17.0 g/dL   HCT 57.8 (L) 46.9 - 62.9 %   MCV 99.1 80.0 - 100.0 fL   MCH 30.9 26.0 - 34.0 pg   MCHC 31.2 30.0 - 36.0 g/dL   RDW 52.8 41.3 - 24.4 %   Platelets 217 150 - 400 K/uL   nRBC 0.0 0.0 -  0.2 %  Reticulocytes     Status: Abnormal   Collection Time: 05/21/23  2:04 PM  Result Value Ref Range   Retic Ct Pct 2.0 0.4 - 3.1 %   RBC. 3.53 (L) 4.22 - 5.81 MIL/uL   Retic Count, Absolute 69.5 19.0 - 186.0 K/uL   Immature Retic Fract 18.0 (H) 2.3 - 15.9 %   Basic Metabolic Panel: Recent Labs  Lab 05/21/23 1125  NA 138  K 4.7  CL 101  CO2 27  GLUCOSE 107*   BUN 20  CREATININE 1.31*  CALCIUM  9.6   Liver Function Tests: Recent Labs  Lab 05/21/23 1125  AST 33  ALT 11  ALKPHOS 62  BILITOT 1.5*  PROT 7.0  ALBUMIN 3.6   No results for input(s): "LIPASE", "AMYLASE" in the last 168 hours. No results for input(s): "AMMONIA" in the last 168 hours. CBC: Recent Labs  Lab 05/21/23 1404  WBC 8.5  HGB 10.5*  HCT 33.7*  MCV 99.1  PLT 217   Cardiac Enzymes: No results for input(s): "CKTOTAL", "CKMB", "CKMBINDEX", "TROPONINIHS" in the last 168 hours.  BNP (last 3 results) Recent Labs    11/08/22 1623  PROBNP 1,382*   CBG: No results for input(s): "GLUCAP" in the last 168 hours.  Radiological Exams on Admission:  CT ANGIO GI BLEED Result Date: 05/21/2023 CLINICAL DATA:  lower gi bleed, brisk EXAM: CTA ABDOMEN AND PELVIS WITHOUT AND WITH CONTRAST TECHNIQUE: Multidetector CT imaging of the abdomen and pelvis was performed using the standard protocol during bolus administration of intravenous contrast. Multiplanar reconstructed images and MIPs were obtained and reviewed to evaluate the vascular anatomy. RADIATION DOSE REDUCTION: This exam was performed according to the departmental dose-optimization program which includes automated exposure control, adjustment of the mA and/or kV according to patient size and/or use of iterative reconstruction technique. CONTRAST:  OMNIPAQUE  IOHEXOL  350 MG/ML SOLN COMPARISON:  November 06, 2022 FINDINGS: VASCULAR Aorta: No aortic aneurysm, intramural hematoma, or dissection. Diffuse calcified atherosclerosis. No hemodynamically significant stenosis. Celiac: Patent without acute thrombus, aneurysm, or dissection. No hemodynamically significant stenosis. Normal variant anatomy with the left gastric artery arising directly from the aorta. SMA: Patent without acute thrombus, aneurysm, or dissection. No hemodynamically significant stenosis. Renals: Patent without acute thrombus, aneurysm, or dissection. No  hemodynamically significant stenosis. Two right renal arteries with a single left renal artery. IMA: Patent without acute thrombus, aneurysm, or dissection. No hemodynamically significant stenosis. Inflow: Patent without acute thrombus, aneurysm, or dissection. No hemodynamically significant stenosis. Proximal Outflow: The bilateral common femoral and visualized portions of the superficial and profunda femoral arteries are patent without acute thrombus, aneurysm, or dissection. No hemodynamically significant stenosis. Veins: No obvious venous abnormality within the limitations of this arterial phase study. Review of the MIP images confirms the above findings. NON-VASCULAR Lower chest: No focal airspace consolidation or pleural effusion.Moderate cardiomegaly with biatrial predominance. Posterior bibasilar dependent atelectasis. Hepatobiliary: No mass.No radiopaque stones or wall thickening of the gallbladder.No intrahepatic or extrahepatic biliary ductal dilation. Portal veins are patent. Pancreas: No mass or main ductal dilation.No peripancreatic inflammation or fluid collection. Spleen: Normal size. No mass. Adrenals/Urinary Tract: No adrenal masses. No renal mass. No hydronephrosis or nephrolithiasis. The urinary bladder is distended without focal abnormality. Stomach/Bowel: The stomach is decompressed without focal abnormality. No small bowel wall thickening or inflammation. No small bowel obstruction. Normal appendix. Descending and sigmoid colonic diverticulosis. No changes of acute diverticulitis. GI Bleed: No extravasation of contrast to suggest active GI bleeding. Lymphatic: No  intraabdominal or pelvic lymphadenopathy. Reproductive: No prostatomegaly.No free pelvic fluid. Other: No pneumoperitoneum, ascites, or mesenteric inflammation. Musculoskeletal: No acute fracture or destructive lesion.Chronic compression fracture of L1 with vertebroplasty cement. Retropulsion with moderate to severe narrowing of the  spinal canal. Mild compression deformity of L2, unchanged. L4 through S1 posterior fusion hardware with intervertebral disc spacer at L4-L5. Mild anterolisthesis of L4 on L5. Right hip arthroplasty is anatomically aligned without dislocation. IMPRESSION: VASCULAR No aortic aneurysm, intramural hematoma, or aortic dissection. NON-VASCULAR 1. Descending and sigmoid colonic diverticulosis. No extravasation of contrast to suggest active GI bleeding. 2. Moderate cardiomegaly with biatrial predominance. Electronically Signed   By: Rance Burrows M.D.   On: 05/21/2023 15:20    chest X-ray    No intake/output data recorded. No intake/output data recorded.      Assessment and Plan: * BRBPR (bright red blood per rectum) See HPI for full details.  This is associated with slight AKI/creatinine elevation to 1.31 no BUN elevation as well as diastolic blood pressure less than 60.  Patient is on metoprolol  therefore tachycardia would not be expected.  I feel that the patient is slightly volume depleted and will give a liter of LR bolus and maintain on IV hydration tonight.  GI bleed is felt to be lower, will give empiric pantoprazole.  And stool softener as needed. Hold xarelto . Type and screen active.  At this time no active bleeding is clinically identified.  Will check CBC in the morning.  Lumbar radiculopathy C/w tramadol  Cardiomyopathy (HCC) This is chronic, patient does not take Entresto  at home, so hold same.  Continue with metoprolol .  Continue with Farxiga  and Crestor .  Resume torsemide  once patient's vital stabilized  Atrial fibrillation (HCC) This is chronic, continue with metoprolol  for rate control.  Hold Xarelto   History of pulmonary embolism This is remote, hold Xarelto       Advance Care Planning:   Code Status: Full Code   Consults: Dr. Veronda Goody will see patient in AM  Family Communication: per pateint. Detailed discucssion with patient on careplan done.  Severity of  Illness: The appropriate patient status for this patient is INPATIENT. Inpatient status is judged to be reasonable and necessary in order to provide the required intensity of service to ensure the patient's safety. The patient's presenting symptoms, physical exam findings, and initial radiographic and laboratory data in the context of their chronic comorbidities is felt to place them at high risk for further clinical deterioration. Furthermore, it is not anticipated that the patient will be medically stable for discharge from the hospital within 2 midnights of admission.   * I certify that at the point of admission it is my clinical judgment that the patient will require inpatient hospital care spanning beyond 2 midnights from the point of admission due to high intensity of service, high risk for further deterioration and high frequency of surveillance required.*  Author: Bennie Brave, MD 05/21/2023 6:08 PM  For on call review [email or web address].

## 2023-05-21 NOTE — ED Provider Notes (Signed)
 [place] EMERGENCY DEPARTMENT AT [facility] Provider Note  CSN: [id number] Arrival date & time: 05/21/23 1108  Chief Complaint(s) Rectal Bleeding  HPI Aaron Richmond is a 78 y.o. male with past medical history as below, significant for A-fib on DOAC, CKD, hypertension, hyperlipidemia, ascending aortic aneurysm who presents to the ED with complaint of rectal bleeding  Rectal bleeding over the past 4 days, bright red blood mixed with some dark red blood, occasional clots.  He was seen by PCP and his Xarelto  was cut in half.  Still having ongoing bleeding.  He is bleeding between bowel movements.  No abdominal pain.  No hematemesis, no increased bruising.  No syncope, no dyspnea, does report ongoing fatigue but not significant worsened from his typical.  Past Medical History Past Medical History:  Diagnosis Date   Aneurysm of aortic root    Overview:  Last Assessment & Plan:  Planning to see Dr Nicanor Barge for evaluation of 5cm aneurysm.  - will check PFT in prep for possible procedure / SGY   Aortic atherosclerosis (HCC) 06/14/2022   Aortic regurgitation    Aortic valve insufficiency    Ascending aortic aneurysm (HCC) 09/11/2016   Atherosclerotic vascular disease 05/27/2022   Atrial fibrillation (HCC)    Atrial fibrillation with RVR (HCC) 07/05/2022   BMI 27.0-27.9,adult 06/14/2022   BPH (benign prostatic hyperplasia)    Cardiomyopathy (HCC) 09/11/2016   CKD (chronic kidney disease) stage 2, GFR 60-89 ml/min 11/23/2021   Coronary artery calcification seen on CT scan 10/13/2017   Essential hypertension 06/14/2022   High risk medication use 07/31/2022   History of cardiomyopathy 11/09/2021   History of pulmonary embolism 09/24/2012   Overview:  Last Assessment & Plan:  Hx PE x 2 per notes, ? 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  - needs auto-titration study by [employer] or local company, data to RB to adjust his home device.    Prediabetes    Preoperative clearance 10/13/2017   Primary osteoarthritis involving multiple joints 06/14/2022   Prostate cancer metastatic to bone (HCC) 06/14/2022   Pulmonary embolism (HCC)    Rhinitis    Seasonal allergies 06/14/2022   Spondylolisthesis of lumbar region 12/12/2021   Stage 3a chronic kidney disease (HCC) 06/14/2022   Patient Active Problem List   Diagnosis Date Noted   BRBPR (bright red blood per rectum) 05/20/2023   Gastrointestinal hemorrhage 05/20/2023   Chronic low back pain without sciatica 05/20/2023   Sacral wound 04/11/2023   Closed fracture of right hip (HCC) 03/10/2023   Unstageable pressure ulcer of sacral region (HCC) 03/10/2023   OSA (obstructive sleep apnea) 09/13/2022   Prediabetes 09/13/2022   High risk medication use 07/31/2022   Atrial fibrillation with RVR (HCC) 07/05/2022   Prostate cancer metastatic to bone (HCC) 06/14/2022   Aortic atherosclerosis (HCC) 06/14/2022   Essential hypertension 06/14/2022   Stage 3a chronic kidney disease (HCC) 06/14/2022   Hypercholesterolemia 06/14/2022  Mitral valve insufficiency 06/14/2022   Primary osteoarthritis involving multiple joints 06/14/2022   Seasonal allergies 06/14/2022   BMI 27.0-27.9,adult 06/14/2022   Atherosclerotic vascular disease 05/27/2022   Lumbar radiculopathy 01/24/2022   Spondylolisthesis of lumbar region 12/12/2021   CKD (chronic kidney disease) stage 2,  GFR 60-89 ml/min 11/23/2021   History of cardiomyopathy 11/09/2021   Aortic regurgitation 06/11/2021   BPH (benign prostatic hyperplasia) 06/11/2021   Pulmonary embolism (HCC) 06/11/2021   Rhinitis 06/11/2021   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   Malignant neoplasm of prostate (HCC) 10/04/2020   Malignant neoplasm of prostate metastatic to bone (HCC) 10/04/2020   Obesity (BMI 30-39.9) 10/16/2017   Hyperlipidemia 10/14/2017   Preoperative clearance 10/13/2017   Coronary artery calcification seen on CT scan 10/13/2017   Cardiomyopathy (HCC) 09/11/2016   Ascending aortic aneurysm (HCC) 09/11/2016   Hypertension 10/06/2014   History of pulmonary embolism 09/24/2012   Atrial fibrillation (HCC) 09/24/2012   Obstructive sleep apnea syndrome 09/24/2012   Aneurysm of aortic root (HCC)    Aortic valve insufficiency    Home Medication(s) Prior to Admission medications   Medication Sig Start Date End Date Taking? Authorizing Provider  acetaminophen  (TYLENOL ) 500 MG tablet Take 1,000 mg by mouth in the morning and at bedtime.   Yes [provider]  Cyanocobalamin (B-12) 5000 MCG CAPS Take 1 capsule by mouth daily at 12 noon.   Yes [provider]  ENTRESTO  24-26 MG Take 1 tablet by mouth 2 (two) times daily. 03/23/23  Yes [provider]  FARXIGA  5 MG TABS tablet Take 1 tablet by mouth once daily 03/03/23  Yes Wayne Haines, MD  KLOR-CON  M20 20 MEQ tablet Take 20 mEq by mouth daily. 03/30/23  Yes [provider]  metoprolol  tartrate (LOPRESSOR ) 50 MG tablet TAKE 1 & 1/2 (ONE & ONE-HALF) TABLETS BY MOUTH TWICE DAILY Patient taking differently: Take 75 mg by mouth 2 (two) times daily. 01/24/23  Yes Wayne Haines, MD  Misc Natural Products (ADV TURMERIC CURCUMIN COMPLEX) CAPS Take 2 capsules by mouth in the morning and at bedtime.   Yes [provider]  Multiple Vitamin (MULTIVITAMIN WITH MINERALS) TABS tablet Take 1 tablet by  mouth daily.   Yes [provider]  rivaroxaban  (XARELTO ) 10 MG TABS tablet Take 1 tablet (10 mg total) by mouth daily for 10 days. 05/20/23 05/30/23 Yes Wayne Haines, MD  rosuvastatin  (CRESTOR ) 5 MG tablet Take 1 tablet (5 mg total) by mouth daily. 09/24/22  Yes Hassan Links, MD  tamsulosin (FLOMAX) 0.4 MG CAPS capsule TAKE 1 CAPSULE BY MOUTH TWICE DAILY 30 MINUTES FOLLOWING  THE  SAME  MEAL  EACH  DAY Patient taking differently: Take 0.4 mg by mouth in the morning and at bedtime. 03/24/23  Yes Wayne Haines, MD  torsemide  (DEMADEX ) 20 MG tablet Take one tab po BID except on M/W/F take 2 tabs po in AM and 1 tab po in PM Patient taking differently: Take 20-40 mg by mouth 2 (two) times daily. Take one tablet 20 mg by mouth twice a day on Tuesday, Thursday, Saturday and Sunday. Take two tablets 40 mg by mouth twice a day on Monday, Wednesday and Friday. 09/13/22  Yes Wayne Haines, MD  traMADol (ULTRAM) 50 MG tablet Take 2 tablets (100 mg total) by mouth every 8 (eight) hours as needed. Patient taking differently: Take 100 mg by mouth every 8 (eight) hours as needed for moderate pain (pain score  4-6) or severe pain (pain score 7-10). 05/20/23  Yes Wayne Haines, MD  methocarbamol  (ROBAXIN ) 750 MG tablet Take 750 mg by mouth 3 (three) times daily. Patient not taking: Reported on 05/21/2023 04/09/23   [provider]  naproxen (NAPROSYN) 500 MG tablet Take 500 mg by mouth 2 (two) times daily. Patient not taking: Reported on 05/21/2023 04/09/23   [provider]  rivaroxaban  (XARELTO ) 20 MG TABS tablet Take 1 tablet (20 mg total) by mouth daily with supper. Patient not taking: Reported on 05/21/2023 12/19/22   Hassan Links, MD                                                                                                                                    Past Surgical History Past Surgical History:  Procedure Laterality Date   APPENDECTOMY     HERNIA REPAIR     LUMBAR  LAMINECTOMY  02/06/2022   Facetectom & foraminotomy for decompression of the cauda equina & nerve root L4-5, Alcide Humble, MD   TONSILLECTOMY     Family History Family History  Problem Relation Age of Onset   Stroke Mother    Alzheimer's disease Mother    Heart attack Mother    Stroke Father    Diabetes Mellitus II Father     Social History Social History   Tobacco Use   Smoking status: Never   Smokeless tobacco: Never  Vaping Use   Vaping status: Never Used  Substance Use Topics   Alcohol  use: No   Drug use: No   Allergies Ciprofloxacin  Review of Systems A thorough review of systems was obtained and all systems are negative except as noted in the HPI and PMH.   Physical Exam Vital Signs  I have reviewed the triage vital signs BP (!) 120/48   Pulse 84   Temp 99.1 F (37.3 C) (Oral)   Resp 18   Ht 5\' 9"  (1.753 m)   Wt 78 kg   SpO2 96%   BMI 25.39 kg/m  Physical Exam Vitals and nursing note reviewed. Exam conducted with a chaperone present.  Constitutional:      General: He is not in acute distress.    Appearance: He is well-developed.  HENT:     Head: Normocephalic and atraumatic.     Right Ear: External ear normal.     Left Ear: External ear normal.     Mouth/Throat:     Mouth: Mucous membranes are moist.  Eyes:     General: No scleral icterus. Cardiovascular:     Rate and Rhythm: Normal rate and regular rhythm.     Pulses: Normal pulses.     Heart sounds: Normal heart sounds.  Pulmonary:     Effort: Pulmonary effort is normal. No respiratory distress.     Breath sounds: Normal breath sounds.  Abdominal:     General: Abdomen is flat.  Palpations: Abdomen is soft.     Tenderness: There is no abdominal tenderness.  Genitourinary:    Comments: Bright red blood noted to rectal opening, no obvious bleeding hemorrhoids. Musculoskeletal:     Cervical back: No rigidity.     Right lower leg: No edema.     Left lower leg: No edema.  Skin:     General: Skin is warm and dry.     Capillary Refill: Capillary refill takes less than 2 seconds.  Neurological:     Mental Status: He is alert.  Psychiatric:        Mood and Affect: Mood normal.        Behavior: Behavior normal.     ED Results and Treatments Labs (all labs ordered are listed, but only abnormal results are displayed) Labs Reviewed  COMPREHENSIVE METABOLIC PANEL WITH GFR - Abnormal; Notable for the following components:      Result Value   Glucose, Bld 107 (*)    Creatinine, Ser 1.31 (*)    Total Bilirubin 1.5 (*)    GFR, Estimated 56 (*)    All other components within normal limits  CBC - Abnormal; Notable for the following components:   RBC 3.40 (*)    Hemoglobin 10.5 (*)    HCT 33.7 (*)    All other components within normal limits  RETICULOCYTES - Abnormal; Notable for the following components:   RBC. 3.53 (*)    Immature Retic Fract 18.0 (*)    All other components within normal limits  PROTIME-INR  VITAMIN B12  FOLATE  IRON AND TIBC  FERRITIN  TYPE AND SCREEN  ABO/RH                                                                                                                          Radiology No results found.  Pertinent labs & imaging results that were available during my care of the patient were reviewed by me and considered in my medical decision making (see MDM for details).  Medications Ordered in ED Medications  pantoprazole (PROTONIX) injection 40 mg (has no administration in time range)                                                                                                                                     Procedures Procedures  (including critical care time)  Medical Decision Making / ED Course  Medical Decision Making:    YOSUKE GITTENS is a 78 y.o. male with past medical history as below, significant for A-fib on DOAC, CKD, hypertension, hyperlipidemia, ascending aortic aneurysm who presents to the ED with  complaint of rectal bleeding. The complaint involves an extensive differential diagnosis and also carries with it a high risk of complications and morbidity.  Serious etiology was considered. Ddx includes but is not limited to: Upper GI bleed, lower GI bleed, AVM, bleeding hemorrhoid, diverticular bleed, etc.  Complete initial physical exam performed, notably the patient was in no distress, abdomen benign.    Reviewed and confirmed nursing documentation for past medical history, family history, social history.  Vital signs reviewed.     Brief summary:  78 year old male history as above including A-fib on DOAC here with rectal bleeding. Ongoing proximately 4 days, bright blood mixed with dark blood and occasional clots Bright red blood noted on rectal exam. He is HDS. Seen by PCP yesterday, Xarelto  dose was reduced, bleeding   Clinical Course as of 05/22/23 0845  Wed May 21, 2023  1357 Creatinine(!): 1.31 Mild elev from prior [SG]  1449 Hemoglobin(!): 10.5 Baseline around 12 [SG]  1554 CT ANGIO GI BLEED 1. Descending and sigmoid colonic diverticulosis. No extravasation of contrast to suggest active GI bleeding. 2. Moderate cardiomegaly with biatrial predominance.   [HN]  1555 Consulted to GI and hospitalist. [HN]    Clinical Course User Index [HN] Merdis Stalling, MD [SG] Russella Courts A, DO     Hemoglobin is reduced from prior, he does have a bright red blood on rectal exam, active GI bleed.  CTA pending. Handoff incoming EDP pending CTA and admission. Was given empiric protonix, will hold off PRBC as pt is HDS and HGB is stable Recommend admission for ongoing gi bleed while anticoagulated            Additional history obtained: -Additional history obtained from spouse -External records from outside source obtained and reviewed including: Chart review including previous notes, labs, imaging, consultation notes including  Home medications, primary care  documentation   Lab Tests: -I ordered, reviewed, and interpreted labs.   The pertinent results include:   Labs Reviewed  COMPREHENSIVE METABOLIC PANEL WITH GFR - Abnormal; Notable for the following components:      Result Value   Glucose, Bld 107 (*)    Creatinine, Ser 1.31 (*)    Total Bilirubin 1.5 (*)    GFR, Estimated 56 (*)    All other components within normal limits  CBC - Abnormal; Notable for the following components:   RBC 3.40 (*)    Hemoglobin 10.5 (*)    HCT 33.7 (*)    All other components within normal limits  RETICULOCYTES - Abnormal; Notable for the following components:   RBC. 3.53 (*)    Immature Retic Fract 18.0 (*)    All other components within normal limits  PROTIME-INR  VITAMIN B12  FOLATE  IRON AND TIBC  FERRITIN  TYPE AND SCREEN  ABO/RH    Notable for CTA GI bleedHGB reduced  EKG   EKG Interpretation Date/Time:    Ventricular Rate:    PR Interval:    QRS Duration:    QT Interval:    QTC Calculation:   R Axis:      Text Interpretation:           Imaging Studies ordered: I ordered imaging studies including CTA GIB >> Pending   Medicines ordered and prescription drug  management: Meds ordered this encounter  Medications   pantoprazole (PROTONIX) injection 40 mg    -I have reviewed the patients home medicines and have made adjustments as needed   Consultations Obtained: na   Cardiac Monitoring: The patient was maintained on a cardiac monitor.  I personally viewed and interpreted the cardiac monitored which showed an underlying rhythm of: nsr Continuous pulse oximetry interpreted by myself, 99% on RA.    Social Determinants of Health:  Diagnosis or treatment significantly limited by social determinants of health: na   Reevaluation: After the interventions noted above, I reevaluated the patient and found that they have stayed the same  Co morbidities that complicate the patient evaluation  Past Medical History:   Diagnosis Date   Aneurysm of aortic root    Overview:  Last Assessment & Plan:  Planning to see Dr Nicanor Barge for evaluation of 5cm aneurysm.  - will check PFT in prep for possible procedure / SGY   Aortic atherosclerosis (HCC) 06/14/2022   Aortic regurgitation    Aortic valve insufficiency    Ascending aortic aneurysm (HCC) 09/11/2016   Atherosclerotic vascular disease 05/27/2022   Atrial fibrillation (HCC)    Atrial fibrillation with RVR (HCC) 07/05/2022   BMI 27.0-27.9,adult 06/14/2022   BPH (benign prostatic hyperplasia)    Cardiomyopathy (HCC) 09/11/2016   CKD (chronic kidney disease) stage 2, GFR 60-89 ml/min 11/23/2021   Coronary artery calcification seen on CT scan 10/13/2017   Essential hypertension 06/14/2022   High risk medication use 07/31/2022   History of cardiomyopathy 11/09/2021   History of pulmonary embolism 09/24/2012   Overview:  Last Assessment & Plan:  Hx PE x 2 per notes, ? 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  - needs auto-titration study by [employer] or local company, data to RB to adjust his home device.    Prediabetes    Preoperative clearance 10/13/2017   Primary osteoarthritis involving multiple joints 06/14/2022   Prostate cancer metastatic to bone (HCC) 06/14/2022   Pulmonary embolism (HCC)    Rhinitis    Seasonal allergies 06/14/2022   Spondylolisthesis of lumbar region 12/12/2021    Stage 3a chronic kidney disease (HCC) 06/14/2022      Dispostion: Disposition decision including need for hospitalization was considered, and patient disposition pending at time of sign out.    Final Clinical Impression(s) / ED Diagnoses Final diagnoses:  Gastrointestinal hemorrhage, unspecified gastrointestinal hemorrhage type  Anticoagulated        Teddi Favors, DO 05/22/23 [phone]

## 2023-05-21 NOTE — Assessment & Plan Note (Signed)
 C/w tramadol

## 2023-05-21 NOTE — ED Triage Notes (Addendum)
 Patient has had bright red rectal bleeding since this weekend. Saw PCP Tuesday about it and sent for further evaluation. Increased bleeding over the night. Denies abdominal pain. Takes Xalreto. Denies dizziness or fatigue.

## 2023-05-21 NOTE — Assessment & Plan Note (Signed)
 See HPI for full details.  This is associated with slight AKI/creatinine elevation to 1.31 no BUN elevation as well as diastolic blood pressure less than 60.  Patient is on metoprolol  therefore tachycardia would not be expected.  I feel that the patient is slightly volume depleted and will give a liter of LR bolus and maintain on IV hydration tonight.  GI bleed is felt to be lower, will give empiric pantoprazole.  And stool softener as needed. Hold xarelto . Type and screen active.  At this time no active bleeding is clinically identified.  Will check CBC in the morning.

## 2023-05-21 NOTE — ED Notes (Signed)
IV team at bedside for IV and blood draw

## 2023-05-21 NOTE — Assessment & Plan Note (Signed)
 This is chronic, patient does not take Entresto  at home, so hold same.  Continue with metoprolol .  Continue with Farxiga  and Crestor .  Resume torsemide  once patient's vital stabilized

## 2023-05-21 NOTE — Assessment & Plan Note (Signed)
 This is chronic, continue with metoprolol  for rate control.  Hold Xarelto 

## 2023-05-21 NOTE — Assessment & Plan Note (Signed)
 This is remote, hold Xarelto 

## 2023-05-21 NOTE — ED Provider Triage Note (Signed)
 Emergency Medicine Provider Triage Evaluation Note  Aaron Richmond , a 78 y.o. male  was evaluated in triage.  Pt complains of BRBPR.  Review of Systems  Positive:  Negative:   Physical Exam  BP (!) 109/40   Pulse 65   Temp 99.1 F (37.3 C) (Oral)   Resp 18   Ht 5\' 9"  (1.753 m)   Wt 78 kg   SpO2 96%   BMI 25.39 kg/m  Gen:   Awake, no distress   Resp:  Normal effort  MSK:   Moves extremities without difficulty  Other:    Medical Decision Making  Medically screening exam initiated at 12:04 PM.  Appropriate orders placed.  Aaron Richmond was informed that the remainder of the evaluation will be completed by another provider, this initial triage assessment does not replace that evaluation, and the importance of remaining in the ED until their evaluation is complete.  BRBPR since friday. On blood thinners. Dr. Basilia Richmond is aware and was supposed to follow with patient tomorrow - but then a bunch of blood in patient's pants when he woke up this morning - Dr. Venice Richmond with GI told patient to come to ED. Patient can't tell what color his stools are because the blood is covering it. No abdominal pain. No hx of hemorrhoids.     [place], [place] 05/21/23 1207

## 2023-05-21 NOTE — ED Provider Notes (Signed)
 3:09 PM Assumed care of patient from off-going team. For more details, please see note from same day.  In brief, this is a 78 y.o. male w/ Afib on xarelto , p/w BRBPR x 4 days. No other complaints. Hgb dropped 2 points. HDS.   Plan/Dispo at time of sign-out & ED Course since sign-out: [ ]  CTA, admit  BP (!) 120/48   Pulse 84   Temp 99.1 F (37.3 C) (Oral)   Resp 18   Ht 5\' 9"  (1.753 m)   Wt 78 kg   SpO2 96%   BMI 25.39 kg/m    ED Course:   Clinical Course as of 05/21/23 1727  Wed May 21, 2023  1357 Creatinine(!): 1.31 Mild elev from prior [SG]  1449 Hemoglobin(!): 10.5 Baseline around 12 [SG]  1554 CT ANG[facility] GI BLEED 1. Descending and sigmoid colonic diverticulosis. No extravasation of contrast to suggest active GI bleeding. 2. Moderate cardiomegaly with biatrial predominance.   [HN]  1555 Consulted to GI and hospitalist. [HN]    Clinical Course User Index [HN] Merdis Stalling, MD [SG] Teddi Favors, DO   D/w Dr. Veronda Goody who will follow the patient and Dr. Cleda Curly who will admit. Patient made aware and reports understanding. HDS.    ------------------------------- Annita Kindle, MD Emergency Medicine  This note was created using dictation software, which may contain spelling or grammatical errors.   Merdis Stalling, MD 05/21/23 1728

## 2023-05-22 DIAGNOSIS — K625 Hemorrhage of anus and rectum: Secondary | ICD-10-CM | POA: Diagnosis not present

## 2023-05-22 LAB — CBC
HCT: 30.8 % — ABNORMAL LOW (ref 39.0–52.0)
Hemoglobin: 9.6 g/dL — ABNORMAL LOW (ref 13.0–17.0)
MCH: 30.2 pg (ref 26.0–34.0)
MCHC: 31.2 g/dL (ref 30.0–36.0)
MCV: 96.9 fL (ref 80.0–100.0)
Platelets: 183 10*3/uL (ref 150–400)
RBC: 3.18 MIL/uL — ABNORMAL LOW (ref 4.22–5.81)
RDW: 15.4 % (ref 11.5–15.5)
WBC: 7.2 10*3/uL (ref 4.0–10.5)
nRBC: 0 % (ref 0.0–0.2)

## 2023-05-22 LAB — BASIC METABOLIC PANEL WITH GFR
Anion gap: 4 — ABNORMAL LOW (ref 5–15)
BUN: 16 mg/dL (ref 8–23)
CO2: 30 mmol/L (ref 22–32)
Calcium: 8.9 mg/dL (ref 8.9–10.3)
Chloride: 104 mmol/L (ref 98–111)
Creatinine, Ser: 1.16 mg/dL (ref 0.61–1.24)
GFR, Estimated: >60 mL/min (ref >60)
Glucose, Bld: 91 mg/dL (ref 70–99)
Potassium: 3.7 mmol/L (ref 3.5–5.1)
Sodium: 138 mmol/L (ref 135–145)

## 2023-05-22 LAB — HEMOGLOBIN AND HEMATOCRIT, BLOOD
HCT: 30.4 % — ABNORMAL LOW (ref 39.0–52.0)
Hemoglobin: 9.7 g/dL — ABNORMAL LOW (ref 13.0–17.0)

## 2023-05-22 LAB — APTT: aPTT: 31 s (ref 24–36)

## 2023-05-22 LAB — PROTIME-INR
INR: 1.4 — ABNORMAL HIGH (ref 0.8–1.2)
Prothrombin Time: 16.9 s — ABNORMAL HIGH (ref 11.4–15.2)

## 2023-05-22 MED ORDER — SIMETHICONE 80 MG PO CHEW
240.0000 mg | CHEWABLE_TABLET | Freq: Once | ORAL | Status: AC
  Administered 2023-05-22: 240 mg via ORAL
  Filled 2023-05-22: qty 3

## 2023-05-22 MED ORDER — SIMETHICONE 80 MG PO CHEW
240.0000 mg | CHEWABLE_TABLET | Freq: Once | ORAL | Status: AC
  Administered 2023-05-23: 240 mg via ORAL
  Filled 2023-05-22: qty 3

## 2023-05-22 MED ORDER — PEG 3350-KCL-NA BICARB-NACL 420 G PO SOLR
2000.0000 mL | Freq: Once | ORAL | Status: AC
  Administered 2023-05-23: 2000 mL via ORAL

## 2023-05-22 MED ORDER — PEG 3350-KCL-NA BICARB-NACL 420 G PO SOLR
2000.0000 mL | Freq: Once | ORAL | Status: AC
  Administered 2023-05-22: 2000 mL via ORAL
  Filled 2023-05-22: qty 4000

## 2023-05-22 NOTE — Consult Note (Signed)
 Referring Provider: TH Primary Care Physician:  Wayne Haines, MD Primary Gastroenterologist: Para Bold  Reason for Consultation: Rectal bleeding  HPI: Aaron Richmond is a 78 y.o. male with past medical history of atrial fibrillation currently on Xarelto , remote history of DVT, history of prostate cancer with metastasis to bone status post radiation presented to the hospital with rectal bleeding. Initial blood work yesterday showed mild drop in hemoglobin to 10.5.  Baseline hemoglobin of around 12.  Mildly.  INR at 1.6.  Normal LFTs.  Low ferritin.  Normal B12 and folate acid level.  CT angio negative for any active bleeding.  Showed left-sided diverticulosis.  GI is consulted for further evaluation.  Patient seen and examined at bedside.  Intermittent bleeding since last 5 to 6 days.  Described as bright red blood per rectum with bowel movement.  Denies any associated abdominal pain, nausea or vomiting.  Last colonoscopy around 9 years ago with Dr. Randal Bury was normal.  No significant NSAID use.  Last dose of Xarelto  prior to arrival. Past Medical History:  Diagnosis Date   Aneurysm of aortic root    Overview:  Last Assessment & Plan:  Planning to see Dr Nicanor Barge for evaluation of 5cm aneurysm.  - will check PFT in prep for possible procedure / SGY   Aortic atherosclerosis (HCC) 06/14/2022   Aortic regurgitation    Aortic valve insufficiency    Ascending aortic aneurysm (HCC) 09/11/2016   Atherosclerotic vascular disease 05/27/2022   Atrial fibrillation (HCC)    Atrial fibrillation with RVR (HCC) 07/05/2022   BMI 27.0-27.9,adult 06/14/2022   BPH (benign prostatic hyperplasia)    Cardiomyopathy (HCC) 09/11/2016   CKD (chronic kidney disease) stage 2, GFR 60-89 ml/min 11/23/2021   Coronary artery calcification seen on CT scan 10/13/2017   Essential hypertension 06/14/2022   High risk medication use 07/31/2022   History of cardiomyopathy 11/09/2021   History of pulmonary embolism  09/24/2012   Overview:  Last Assessment & Plan:  Hx PE x 2 per notes, ? 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  - needs auto-titration study by [employer] or local company, data to RB to adjust his home device.    Prediabetes    Preoperative clearance 10/13/2017   Primary osteoarthritis involving multiple joints 06/14/2022   Prostate cancer metastatic to bone (HCC) 06/14/2022   Pulmonary embolism (HCC)    Rhinitis    Seasonal allergies 06/14/2022   Spondylolisthesis of lumbar region 12/12/2021   Stage 3a chronic kidney disease (HCC) 06/14/2022    Past Surgical History:  Procedure Laterality Date   APPENDECTOMY     HERNIA REPAIR     LUMBAR LAMINECTOMY  02/06/2022   Facetectom & foraminotomy for decompression of the cauda equina & nerve root L4-5, Alcide Humble, MD   TONSILLECTOMY      Prior to Admission medications   Medication Sig Start Date End Date Taking? Authorizing Provider  acetaminophen  (TYLENOL ) 500 MG  tablet Take 1,000 mg by mouth in the morning and at bedtime.   Yes [provider]  Cyanocobalamin (B-12) 5000 MCG CAPS Take 1 capsule by mouth daily at 12 noon.   Yes [provider]  ENTRESTO  24-26 MG Take 1 tablet by mouth 2 (two) times daily. 03/23/23  Yes [provider]  FARXIGA  5 MG TABS tablet Take 1 tablet by mouth once daily 03/03/23  Yes Mitcheal Amy, Reymundo Caulk, MD  KLOR-CON  M20  20 MEQ tablet Take 20 mEq by mouth daily. 03/30/23  Yes [provider]  Misc Natural Products (ADV TURMERIC CURCUMIN COMPLEX) CAPS Take 2 capsules by mouth in the morning and at bedtime.   Yes [provider]  Multiple Vitamin (MULTIVITAMIN WITH MINERALS) TABS tablet Take 1 tablet by mouth daily.   Yes [provider]  rivaroxaban  (XARELTO ) 10 MG TABS tablet Take 1 tablet (10 mg total) by mouth daily for 10 days. 05/20/23 05/30/23 Yes Wayne Haines, MD  rosuvastatin  (CRESTOR ) 5 MG tablet Take 1 tablet (5 mg total) by mouth daily. 09/24/22  Yes Hassan Links, MD  tamsulosin (FLOMAX) 0.4 MG CAPS capsule TAKE 1 CAPSULE BY MOUTH TWICE DAILY 30 MINUTES FOLLOWING  THE  SAME  MEAL  EACH  DAY Patient taking differently: Take 0.4 mg by mouth in the morning and at bedtime. 03/24/23  Yes Wayne Haines, MD  torsemide  (DEMADEX ) 20 MG tablet Take one tab po BID except on M/W/F take 2 tabs po in AM and 1 tab po in PM Patient taking differently: Take 20-40 mg by mouth 2 (two) times daily. Take one tablet 20 mg by mouth twice a day on Tuesday, Thursday, Saturday and Sunday. Take two tablets 40 mg by mouth twice a day on Monday, Wednesday and Friday. 09/13/22  Yes Wayne Haines, MD  traMADol (ULTRAM) 50 MG tablet Take 2 tablets (100 mg total) by mouth every 8 (eight) hours as needed. Patient taking differently: Take 100 mg by mouth every 8 (eight) hours as needed for moderate pain (pain score 4-6) or severe pain (pain score 7-10). 05/20/23  Yes Wayne Haines, MD  methocarbamol  (ROBAXIN ) 750 MG tablet Take 750 mg by mouth 3 (three) times daily. Patient not taking: Reported on 05/21/2023 04/09/23   [provider]  metoprolol  tartrate (LOPRESSOR ) 50 MG tablet TAKE 1 & 1/2 (ONE & ONE-HALF) TABLETS BY MOUTH TWICE DAILY 05/22/23   Mitcheal Amy, Jason, MD  naproxen (NAPROSYN) 500 MG tablet Take 500 mg by mouth 2 (two) times daily. Patient not taking: Reported on 05/21/2023 04/09/23   [provider]  rivaroxaban  (XARELTO ) 20 MG TABS tablet Take 1 tablet (20 mg total) by mouth daily with supper. Patient not taking: Reported on 05/21/2023 12/19/22   Hassan Links, MD    Scheduled Meds:  cyanocobalamin  5,000 mcg Oral Q1200   metoprolol  tartrate  75 mg Oral BID   pantoprazole (PROTONIX) IV  40 mg Intravenous Q12H   rosuvastatin   5 mg Oral Daily   senna-docusate  2 tablet Oral BID   sodium chloride flush  3 mL Intravenous Q12H   tamsulosin  0.4 mg Oral QPC supper   Continuous Infusions:  sodium chloride 75 mL/hr at 05/22/23 0420   PRN Meds:.acetaminophen  **OR** acetaminophen , polyethylene glycol, traMADol  Allergies as of 05/21/2023 - Review Complete 05/21/2023  Allergen Reaction Noted   Ciprofloxacin Other (See Comments) 05/27/2022    Family History  Problem Relation Age of Onset  Stroke Mother    Alzheimer's disease Mother    Heart attack Mother    Stroke Father    Diabetes Mellitus II Father     Social History   Socioeconomic History   Marital status: Married    Spouse name: Not on file   Number of children: 0   Years of education: Not on file   Highest education level: Not on file  Occupational History   Occupation: Chief Operating Officer  Tobacco Use   Smoking status: Never   Smokeless tobacco: Never  Vaping Use   Vaping status: Never Used  Substance and Sexual Activity   Alcohol  use: No   Drug use: No   Sexual activity: Not on file  Other Topics Concern   Not on file  Social History Narrative   Not on file   Social Drivers of Health   Financial Resource Strain: Not on file  Food Insecurity: No Food Insecurity (05/21/2023)   Hunger Vital Sign    Worried About Running Out of Food in the Last Year: Never true    Ran Out of Food in the Last Year: Never true  Transportation Needs: No Transportation Needs (05/21/2023)   PRAPARE - Administrator, Civil Service (Medical): No    Lack of Transportation (Non-Medical): No  Physical  Activity: Not on file  Stress: Not on file  Social Connections: Socially Integrated (05/21/2023)   Social Connection and Isolation Panel [NHANES]    Frequency of Communication with Friends and Family: More than three times a week    Frequency of Social Gatherings with Friends and Family: Once a week    Attends Religious Services: More than 4 times per year    Active Member of [employer] or Organizations: Yes    Attends Banker Meetings: 1 to 4 times per year    Marital Status: Married  Catering manager Violence: Not At Risk (05/21/2023)   Humiliation, Afraid, Rape, and Kick questionnaire    Fear of Current or Ex-Partner: No    Emotionally Abused: No    Physically Abused: No    Sexually Abused: No    Review of Systems: All negative except as stated above in HPI.  Physical Exam: Vital signs: Vitals:   05/22/23 0034 05/22/23 0446  BP: (!) 114/56 (!) 105/46  Pulse: 70 62  Resp: 14 14  Temp:  97.6 F (36.4 C)  SpO2: 93% 96%   Last BM Date : 05/20/23 General:   Alert,  Well-developed, well-nourished, pleasant and cooperative in NAD NS, atraumatic, extraocular movement intact Lungs: No visible respiratory distress Heart:  Regular rate and rhythm; no murmurs, clicks, rubs,  or gallops. Abdomen: Soft, nontender, nondistended, bowel sound present, no peritoneal signs Mood and affect normal Alert and oriented x 3 Rectal:  Deferred  GI:  Lab Results: Recent Labs    05/21/23 1404 05/22/23 0603  WBC 8.5 7.2  HGB 10.5* 9.6*  HCT 33.7* 30.8*  PLT 217 183   BMET Recent Labs    05/21/23 1125 05/22/23 0603  NA 138 138  K 4.7 3.7  CL 101 104  CO2 27 30  GLUCOSE 107* 91  BUN 20 16  CREATININE 1.31* 1.16  CALCIUM  9.6 8.9   LFT Recent Labs    05/21/23 1125  PROT 7.0  ALBUMIN 3.6  AST 33  ALT 11  ALKPHOS 62  BILITOT 1.5*   PT/INR Recent Labs    05/21/23 1404 05/22/23 0603  LABPROT 19.0* 16.9*  INR 1.6*  1.4*     Studies/Results: CT ANGIO GI  BLEED Result Date: 05/21/2023 CLINICAL DATA:  lower gi bleed, brisk EXAM: CTA ABDOMEN AND PELVIS WITHOUT AND WITH CONTRAST TECHNIQUE: Multidetector CT imaging of the abdomen and pelvis was performed using the standard protocol during bolus administration of intravenous contrast. Multiplanar reconstructed images and MIPs were obtained and reviewed to evaluate the vascular anatomy. RADIATION DOSE REDUCTION: This exam was performed according to the departmental dose-optimization program which includes automated exposure control, adjustment of the mA and/or kV according to patient size and/or use of iterative reconstruction technique. CONTRAST:  OMNIPAQUE  IOHEXOL  350 MG/ML SOLN COMPARISON:  November 06, 2022 FINDINGS: VASCULAR Aorta: No aortic aneurysm, intramural hematoma, or dissection. Diffuse calcified atherosclerosis. No hemodynamically significant stenosis. Celiac: Patent without acute thrombus, aneurysm, or dissection. No hemodynamically significant stenosis. Normal variant anatomy with the left gastric artery arising directly from the aorta. SMA: Patent without acute thrombus, aneurysm, or dissection. No hemodynamically significant stenosis. Renals: Patent without acute thrombus, aneurysm, or dissection. No hemodynamically significant stenosis. Two right renal arteries with a single left renal artery. IMA: Patent without acute thrombus, aneurysm, or dissection. No hemodynamically significant stenosis. Inflow: Patent without acute thrombus, aneurysm, or dissection. No hemodynamically significant stenosis. Proximal Outflow: The bilateral common femoral and visualized portions of the superficial and profunda femoral arteries are patent without acute thrombus, aneurysm, or dissection. No hemodynamically significant stenosis. Veins: No obvious venous abnormality within the limitations of this arterial phase study. Review of the MIP images confirms the above findings. NON-VASCULAR Lower chest: No focal airspace  consolidation or pleural effusion.Moderate cardiomegaly with biatrial predominance. Posterior bibasilar dependent atelectasis. Hepatobiliary: No mass.No radiopaque stones or wall thickening of the gallbladder.No intrahepatic or extrahepatic biliary ductal dilation. Portal veins are patent. Pancreas: No mass or main ductal dilation.No peripancreatic inflammation or fluid collection. Spleen: Normal size. No mass. Adrenals/Urinary Tract: No adrenal masses. No renal mass. No hydronephrosis or nephrolithiasis. The urinary bladder is distended without focal abnormality. Stomach/Bowel: The stomach is decompressed without focal abnormality. No small bowel wall thickening or inflammation. No small bowel obstruction. Normal appendix. Descending and sigmoid colonic diverticulosis. No changes of acute diverticulitis. GI Bleed: No extravasation of contrast to suggest active GI bleeding. Lymphatic: No intraabdominal or pelvic lymphadenopathy. Reproductive: No prostatomegaly.No free pelvic fluid. Other: No pneumoperitoneum, ascites, or mesenteric inflammation. Musculoskeletal: No acute fracture or destructive lesion.Chronic compression fracture of L1 with vertebroplasty cement. Retropulsion with moderate to severe narrowing of the spinal canal. Mild compression deformity of L2, unchanged. L4 through S1 posterior fusion hardware with intervertebral disc spacer at L4-L5. Mild anterolisthesis of L4 on L5. Right hip arthroplasty is anatomically aligned without dislocation. IMPRESSION: VASCULAR No aortic aneurysm, intramural hematoma, or aortic dissection. NON-VASCULAR 1. Descending and sigmoid colonic diverticulosis. No extravasation of contrast to suggest active GI bleeding. 2. Moderate cardiomegaly with biatrial predominance. Electronically Signed   By: Rance Burrows M.D.   On: 05/21/2023 15:20    Impression/Plan: -Painless hematochezia.  Negative CT angio.  Likely diverticular bleed versus hemorrhoidal bleed.  Versus  radiation proctitis - Acute blood loss anemia - History of atrial fibrillation.  Last dose of Xarelto  prior to arrival likely yesterday morning. - History of prostate cancer status post radiation.  Recommendations ---------------------------- - Patient prefers to have inpatient colonoscopy done because of his underlying comorbidities. - Plan for colonoscopy tomorrow - Okay to have clear liquids diet.  Keep n.p.o. past midnight. - Continue to hold anticoagulation  Risks (bleeding, infection, bowel perforation that could require  surgery, sedation-related changes in cardiopulmonary systems), benefits (identification and possible treatment of source of symptoms, exclusion of certain causes of symptoms), and alternatives (watchful waiting, radiographic imaging studies, empiric medical treatment)  were explained to patient/family in detail and patient wishes to proceed.     LOS: 1 day   Felecia Hopper  MD, FACP 05/22/2023, 10:31 AM  Contact #  [phone]

## 2023-05-22 NOTE — Progress Notes (Signed)
 PROGRESS NOTE    Aaron Richmond  VWU:[id number] DOB: 08-16-1945 DOA: 05/21/2023 PCP: Wayne Haines, MD    Brief Narrative:   Aaron Richmond is a 78 y.o. male with medical history significant of rectal issues as listed below including atrial fibrillation and a remote venous thromboembolism requiring patient to be on Xarelto .  Patient seems to have been in his usual state of health till about 4 days ago when he reports a new onset of rectal bleeding which was mixed with stool also separate from stool.  About 3 episodes a day which would stay in his undergarment.  Patient denies any hematuria gum bleeding nosebleeding hematemesis or any other site of bleeding.  Patient denies any chest pain shortness of breath or presyncope.   Patient saw his PCP yesterday who did rectal exam that is in chart with finding of bright red blood per rectum.  And no obvious external source of bleeding.  Patient was subsequently sent to the ER today.  Here in the ER patient vital signs are stable.  Patient has had a CT angiogram abdomen pelvis done.  No extravasation of contrast is noted.  Medical evaluation is sought.   Assessment and Plan:  BRBPR (bright red blood per rectum) -associated with slight AKI/creatinine elevation to 1.31 no BUN elevation as well as diastolic blood pressure less than 60.   -trend CBC -GI consulted-- colonoscopy in the AM  Lumbar radiculopathy C/w tramadol  Cardiomyopathy (HCC) -resume home meds once d/c'd  Atrial fibrillation (HCC) - chronic, continue with metoprolol  for rate control.  Hold Xarelto   History of pulmonary embolism - remote, hold Xarelto       DVT prophylaxis: SCDs Start: 05/21/23 1709    Code Status: Full Code Family Communication:   Disposition Plan:  Level of care: Telemetry Status is: Inpatient     Consultants:  GI   Subjective: Asking to eat  Objective: Vitals:   05/22/23 0034 05/22/23 0034 05/22/23 0446 05/22/23 1258  BP: (!) 114/56  (!) 114/56 (!) 105/46 (!) 113/59  Pulse: 70 70 62 78  Resp:  14 14 16   Temp: 97.9 F (36.6 C)  97.6 F (36.4 C) 98.7 F (37.1 C)  TempSrc: Oral  Oral Oral  SpO2: 93% 93% 96% 92%  Weight:      Height:        Intake/Output Summary (Last 24 hours) at 05/22/2023 1303 Last data filed at 05/22/2023 0447 Gross per 24 hour  Intake 491.25 ml  Output 600 ml  Net -108.75 ml   Filed Weights   05/21/23 1123  Weight: 78 kg    Examination:   General: Appearance:     Overweight male in no acute distress     Lungs:     Clear to auscultation bilaterally, respirations unlabored  Heart:    Normal heart rate.       MS:   All extremities are intact.    Neurologic:   Awake, alert       Data Reviewed: I have personally reviewed following labs and imaging studies  CBC: Recent Labs  Lab 05/21/23 1404 05/22/23 0603  WBC 8.5 7.2  HGB 10.5* 9.6*  HCT 33.7* 30.8*  MCV 99.1 96.9  PLT 217 183   Basic Metabolic Panel: Recent Labs  Lab 05/21/23 1125 05/22/23 0603  NA 138 138  K 4.7 3.7  CL 101 104  CO2 27 30  GLUCOSE 107* 91  BUN 20 16  CREATININE 1.31* 1.16  CALCIUM  9.6 8.9  GFR: Estimated Creatinine Clearance: 54.2 mL/min (by C-G formula based on SCr of 1.16 mg/dL). Liver Function Tests: Recent Labs  Lab 05/21/23 1125  AST 33  ALT 11  ALKPHOS 62  BILITOT 1.5*  PROT 7.0  ALBUMIN 3.6   No results for input(s): "LIPASE", "AMYLASE" in the last 168 hours. No results for input(s): "AMMONIA" in the last 168 hours. Coagulation Profile: Recent Labs  Lab 05/21/23 1404 05/22/23 0603  INR 1.6* 1.4*   Cardiac Enzymes: No results for input(s): "CKTOTAL", "CKMB", "CKMBINDEX", "TROPONINI" in the last 168 hours. BNP (last 3 results) Recent Labs    11/08/22 1623  PROBNP 1,382*   HbA1C: No results for input(s): "HGBA1C" in the last 72 hours. CBG: No results for input(s): "GLUCAP" in the last 168 hours. Lipid Profile: No results for input(s): "CHOL", "HDL",  "LDLCALC", "TRIG", "CHOLHDL", "LDLDIRECT" in the last 72 hours. Thyroid  Function Tests: No results for input(s): "TSH", "T4TOTAL", "FREET4", "T3FREE", "THYROIDAB" in the last 72 hours. Anemia Panel: Recent Labs    05/21/23 1404  VITAMINB12 6,352*  FOLATE >40.0  FERRITIN 23*  TIBC 407  IRON 51  RETICCTPCT 2.0   Sepsis Labs: No results for input(s): "PROCALCITON", "LATICACIDVEN" in the last 168 hours.  No results found for this or any previous visit (from the past 240 hours).       Radiology Studies: CT ANGIO GI BLEED Result Date: 05/21/2023 CLINICAL DATA:  lower gi bleed, brisk EXAM: CTA ABDOMEN AND PELVIS WITHOUT AND WITH CONTRAST TECHNIQUE: Multidetector CT imaging of the abdomen and pelvis was performed using the standard protocol during bolus administration of intravenous contrast. Multiplanar reconstructed images and MIPs were obtained and reviewed to evaluate the vascular anatomy. RADIATION DOSE REDUCTION: This exam was performed according to the departmental dose-optimization program which includes automated exposure control, adjustment of the mA and/or kV according to patient size and/or use of iterative reconstruction technique. CONTRAST:  OMNIPAQUE  IOHEXOL  350 MG/ML SOLN COMPARISON:  November 06, 2022 FINDINGS: VASCULAR Aorta: No aortic aneurysm, intramural hematoma, or dissection. Diffuse calcified atherosclerosis. No hemodynamically significant stenosis. Celiac: Patent without acute thrombus, aneurysm, or dissection. No hemodynamically significant stenosis. Normal variant anatomy with the left gastric artery arising directly from the aorta. SMA: Patent without acute thrombus, aneurysm, or dissection. No hemodynamically significant stenosis. Renals: Patent without acute thrombus, aneurysm, or dissection. No hemodynamically significant stenosis. Two right renal arteries with a single left renal artery. IMA: Patent without acute thrombus, aneurysm, or dissection. No  hemodynamically significant stenosis. Inflow: Patent without acute thrombus, aneurysm, or dissection. No hemodynamically significant stenosis. Proximal Outflow: The bilateral common femoral and visualized portions of the superficial and profunda femoral arteries are patent without acute thrombus, aneurysm, or dissection. No hemodynamically significant stenosis. Veins: No obvious venous abnormality within the limitations of this arterial phase study. Review of the MIP images confirms the above findings. NON-VASCULAR Lower chest: No focal airspace consolidation or pleural effusion.Moderate cardiomegaly with biatrial predominance. Posterior bibasilar dependent atelectasis. Hepatobiliary: No mass.No radiopaque stones or wall thickening of the gallbladder.No intrahepatic or extrahepatic biliary ductal dilation. Portal veins are patent. Pancreas: No mass or main ductal dilation.No peripancreatic inflammation or fluid collection. Spleen: Normal size. No mass. Adrenals/Urinary Tract: No adrenal masses. No renal mass. No hydronephrosis or nephrolithiasis. The urinary bladder is distended without focal abnormality. Stomach/Bowel: The stomach is decompressed without focal abnormality. No small bowel wall thickening or inflammation. No small bowel obstruction. Normal appendix. Descending and sigmoid colonic diverticulosis. No changes of acute diverticulitis. GI Bleed: No  extravasation of contrast to suggest active GI bleeding. Lymphatic: No intraabdominal or pelvic lymphadenopathy. Reproductive: No prostatomegaly.No free pelvic fluid. Other: No pneumoperitoneum, ascites, or mesenteric inflammation. Musculoskeletal: No acute fracture or destructive lesion.Chronic compression fracture of L1 with vertebroplasty cement. Retropulsion with moderate to severe narrowing of the spinal canal. Mild compression deformity of L2, unchanged. L4 through S1 posterior fusion hardware with intervertebral disc spacer at L4-L5. Mild anterolisthesis  of L4 on L5. Right hip arthroplasty is anatomically aligned without dislocation. IMPRESSION: VASCULAR No aortic aneurysm, intramural hematoma, or aortic dissection. NON-VASCULAR 1. Descending and sigmoid colonic diverticulosis. No extravasation of contrast to suggest active GI bleeding. 2. Moderate cardiomegaly with biatrial predominance. Electronically Signed   By: Rance Burrows M.D.   On: 05/21/2023 15:20        Scheduled Meds:  cyanocobalamin  5,000 mcg Oral Q1200   metoprolol  tartrate  75 mg Oral BID   pantoprazole (PROTONIX) IV  40 mg Intravenous Q12H   polyethylene glycol-electrolytes  2,000 mL Oral Once   Followed by   [START ON 05/23/2023] polyethylene glycol-electrolytes  2,000 mL Oral Once   rosuvastatin   5 mg Oral Daily   senna-docusate  2 tablet Oral BID   simethicone  240 mg Oral Once   Followed by   [START ON 05/23/2023] simethicone  240 mg Oral Once   sodium chloride flush  3 mL Intravenous Q12H   tamsulosin  0.4 mg Oral QPC supper   Continuous Infusions:  sodium chloride 75 mL/hr at 05/22/23 0420     LOS: 1 day    Time spent: 45 minutes spent on chart review, discussion with nursing staff, consultants, updating family and interview/physical exam; more than 50% of that time was spent in counseling and/or coordination of care.    Enrigue Harvard, DO [facility] Available via Epic secure chat 7am-7pm After these hours, please refer to coverage provider listed on [email or web address] 05/22/2023, 1:03 PM

## 2023-05-22 NOTE — Plan of Care (Signed)
  Problem: Clinical Measurements: Goal: Will remain free from infection Outcome: Progressing Goal: Diagnostic test results will improve Outcome: Progressing   Problem: Activity: Goal: Risk for activity intolerance will decrease Outcome: Progressing   Problem: Nutrition: Goal: Adequate nutrition will be maintained Outcome: Progressing   Problem: Elimination: Goal: Will not experience complications related to bowel motility Outcome: Progressing Goal: Will not experience complications related to urinary retention Outcome: Progressing   Problem: Safety: Goal: Ability to remain free from injury will improve Outcome: Progressing   Problem: Skin Integrity: Goal: Risk for impaired skin integrity will decrease Outcome: Progressing   

## 2023-05-23 ENCOUNTER — Inpatient Hospital Stay (HOSPITAL_COMMUNITY): Admitting: Anesthesiology

## 2023-05-23 ENCOUNTER — Other Ambulatory Visit: Payer: Self-pay

## 2023-05-23 ENCOUNTER — Other Ambulatory Visit (HOSPITAL_COMMUNITY): Payer: Self-pay

## 2023-05-23 ENCOUNTER — Encounter (HOSPITAL_COMMUNITY)
Admission: EM | Disposition: A | Discharge status: Home / Self Care | Payer: Self-pay | Source: Home / Self Care | Attending: Internal Medicine

## 2023-05-23 ENCOUNTER — Encounter (HOSPITAL_COMMUNITY): Payer: Self-pay | Admitting: Internal Medicine

## 2023-05-23 DIAGNOSIS — K625 Hemorrhage of anus and rectum: Secondary | ICD-10-CM | POA: Diagnosis not present

## 2023-05-23 DIAGNOSIS — K920 Hematemesis: Secondary | ICD-10-CM

## 2023-05-23 DIAGNOSIS — K5730 Diverticulosis of large intestine without perforation or abscess without bleeding: Secondary | ICD-10-CM

## 2023-05-23 DIAGNOSIS — N1831 Chronic kidney disease, stage 3a: Secondary | ICD-10-CM

## 2023-05-23 DIAGNOSIS — I129 Hypertensive chronic kidney disease with stage 1 through stage 4 chronic kidney disease, or unspecified chronic kidney disease: Secondary | ICD-10-CM

## 2023-05-23 DIAGNOSIS — K648 Other hemorrhoids: Secondary | ICD-10-CM

## 2023-05-23 HISTORY — PX: COLONOSCOPY: SHX5424

## 2023-05-23 LAB — BASIC METABOLIC PANEL WITH GFR
Anion gap: 5 (ref 5–15)
BUN: 14 mg/dL (ref 8–23)
CO2: 23 mmol/L (ref 22–32)
Calcium: 8.5 mg/dL — ABNORMAL LOW (ref 8.9–10.3)
Chloride: 105 mmol/L (ref 98–111)
Creatinine, Ser: 0.99 mg/dL (ref 0.61–1.24)
GFR, Estimated: >60 mL/min (ref >60)
Glucose, Bld: 78 mg/dL (ref 70–99)
Potassium: 3.7 mmol/L (ref 3.5–5.1)
Sodium: 133 mmol/L — ABNORMAL LOW (ref 135–145)

## 2023-05-23 LAB — CBC
HCT: 31.9 % — ABNORMAL LOW (ref 39.0–52.0)
Hemoglobin: 10 g/dL — ABNORMAL LOW (ref 13.0–17.0)
MCH: 31.2 pg (ref 26.0–34.0)
MCHC: 31.3 g/dL (ref 30.0–36.0)
MCV: 99.4 fL (ref 80.0–100.0)
Platelets: 176 10*3/uL (ref 150–400)
RBC: 3.21 MIL/uL — ABNORMAL LOW (ref 4.22–5.81)
RDW: 15.1 % (ref 11.5–15.5)
WBC: 7.2 10*3/uL (ref 4.0–10.5)
nRBC: 0 % (ref 0.0–0.2)

## 2023-05-23 SURGERY — COLONOSCOPY
Anesthesia: Monitor Anesthesia Care

## 2023-05-23 MED ORDER — SODIUM CHLORIDE 0.9 % IV SOLN
INTRAVENOUS | Status: AC | PRN
  Administered 2023-05-23: 500 mL via INTRAVENOUS

## 2023-05-23 MED ORDER — PROPOFOL 500 MG/50ML IV EMUL
INTRAVENOUS | Status: DC | PRN
  Administered 2023-05-23: 125 ug/kg/min via INTRAVENOUS

## 2023-05-23 MED ORDER — PROPOFOL 1000 MG/100ML IV EMUL
INTRAVENOUS | Status: AC
  Filled 2023-05-23: qty 100

## 2023-05-23 MED ORDER — TORSEMIDE 20 MG PO TABS
20.0000 mg | ORAL_TABLET | Freq: Two times a day (BID) | ORAL | Status: AC
Start: 2023-05-23 — End: ?

## 2023-05-23 MED ORDER — PROPOFOL 10 MG/ML IV BOLUS
INTRAVENOUS | Status: DC | PRN
Start: 2023-05-23 — End: 2023-05-23
  Administered 2023-05-23: 60 mg via INTRAVENOUS

## 2023-05-23 MED ORDER — RIVAROXABAN 20 MG PO TABS
20.0000 mg | ORAL_TABLET | Freq: Every day | ORAL | Status: DC
Start: 2023-05-25 — End: 2023-09-10

## 2023-05-23 MED ORDER — HYDROCORTISONE ACETATE 25 MG RE SUPP
25.0000 mg | Freq: Two times a day (BID) | RECTAL | 0 refills | Status: AC
  Filled 2023-05-23: qty 20, 10d supply, fill #0
  Filled 2023-05-23: qty 24, 12d supply, fill #0

## 2023-05-23 MED ORDER — LIDOCAINE 2% (20 MG/ML) 5 ML SYRINGE
INTRAMUSCULAR | Status: DC | PRN
  Administered 2023-05-23: 60 mg via INTRAVENOUS

## 2023-05-23 MED ORDER — POLYETHYLENE GLYCOL 3350 17 G PO PACK: 17.0000 g | PACK | Freq: Every day | ORAL | Status: DC | PRN

## 2023-05-23 NOTE — Transfer of Care (Signed)
 Immediate Anesthesia Transfer of Care Note  Patient: Aaron Richmond  Procedure(s) Performed: COLONOSCOPY  Patient Location: Endoscopy Unit  Anesthesia Type:MAC  Level of Consciousness: drowsy and patient cooperative  Airway & Oxygen Therapy: Patient Spontanous Breathing and Patient connected to face mask oxygen  Post-op Assessment: Report given to RN and Post -op Vital signs reviewed and stable  Post vital signs: Reviewed and stable  Last Vitals:  Vitals Value Taken Time  BP    Temp    Pulse    Resp    SpO2      Last Pain:  Vitals:   05/23/23 1036  TempSrc: Temporal  PainSc: 10-Worst pain ever      Patients Stated Pain Goal: 10 (05/23/23 1036)  Complications: No notable events documented.

## 2023-05-23 NOTE — Discharge Summary (Signed)
 Physician Discharge Summary  BIFF CHARON FAO:[id number] DOB: [birth year] DOA: 05/21/2023  PCP: Wayne Haines, MD  Admit date: 05/21/2023 Discharge date: 05/23/2023  Admitted From: Home Discharge disposition: Home   Recommendations for Outpatient Follow-Up:   CBC 1 week Anusol x 2 weeks per GI  Discharge Diagnosis:   Principal Problem:   BRBPR (bright red blood per rectum) Active Problems:   Cardiomyopathy [facility])    Discharge Condition: Improved.  Diet recommendation: Low sodium, heart healthy.  Wound care: None.  Code status: Full.   History of Present Illness:   RALPHAEL TRAME is a 78 y.o. male with medical history significant of rectal issues as listed below including atrial fibrillation and a remote venous thromboembolism requiring patient to be on Xarelto .  Patient seems to have been in his usual state of health till about 4 days ago when he reports a new onset of rectal bleeding which was mixed with stool also separate from stool.  About 3 episodes a day which would stay in his undergarment.  Patient denies any hematuria gum bleeding nosebleeding hematemesis or any other site of bleeding.  Patient denies any chest pain shortness of breath or presyncope.   Patient saw his PCP yesterday who did rectal exam that is in chart with finding of bright red blood per rectum.  And no obvious external source of bleeding.  Patient was subsequently sent to the ER today.  Here in the ER patient vital signs are stable.  Patient has had a CT angiogram abdomen pelvis done.  No extravasation of contrast is noted.  Medical evaluation is sought.   Hospital Course by Problem:    BRBPR (bright red blood per rectum) -associated with slight AKI/creatinine elevation to 1.31 no BUN elevation as well as diastolic blood pressure less than 60.   - Hemoglobin stable -CT GI bleed scan negative -GI consulted--status post colonoscopy: - Colonoscopy showed fair prep, diverticulosis and  internal hemorrhoids.  No evidence of active bleeding. - Start Anusol suppository twice a day for next 2 weeks - Okay to resume Xarelto  tomorrow from GI standpoint     Lumbar radiculopathy C/w tramadol   Cardiomyopathy (HCC) -resume home meds    Atrial fibrillation (HCC) - chronic, continue with metoprolol  for rate control - Xarelto  per GI   History of pulmonary embolism - remote-resume Xarelto  per GI      Medical Consultants:   GI   Discharge Exam:   Vitals:   05/23/23 1210 05/23/23 1330  BP: (!) 100/56 125/66  Pulse: 78 89  Resp: 20 18  Temp:  (!) 97.3 F (36.3 C)  SpO2: 92% 93%   Vitals:   05/23/23 1151 05/23/23 1200 05/23/23 1210 05/23/23 1330  BP: (!) 90/44 (!) 105/42 (!) 100/56 125/66  Pulse: 81 89 78 89  Resp: 17 20 20 18   Temp: 97.8 F (36.6 C)   (!) 97.3 F (36.3 C)  TempSrc: Temporal   Oral  SpO2: 98% 97% 92% 93%  Weight:      Height:        General exam: Appears calm and comfortable.    The results of significant diagnostics from this hospitalization (including imaging, microbiology, ancillary and laboratory) are listed below for reference.     Procedures and Diagnostic Studies:   CT ANGIO GI BLEED Result Date: 05/21/2023 CLINICAL DATA:  lower gi bleed, brisk EXAM: CTA ABDOMEN AND PELVIS WITHOUT AND WITH CONTRAST TECHNIQUE: Multidetector CT imaging of the abdomen and pelvis  was performed using the standard protocol during bolus administration of intravenous contrast. Multiplanar reconstructed images and MIPs were obtained and reviewed to evaluate the vascular anatomy. RADIATION DOSE REDUCTION: This exam was performed according to the departmental dose-optimization program which includes automated exposure control, adjustment of the mA and/or kV according to patient size and/or use of iterative reconstruction technique. CONTRAST:  OMNIPAQUE  IOHEXOL  350 MG/ML SOLN COMPARISON:  November 06, 2022 FINDINGS: VASCULAR Aorta: No aortic aneurysm,  intramural hematoma, or dissection. Diffuse calcified atherosclerosis. No hemodynamically significant stenosis. Celiac: Patent without acute thrombus, aneurysm, or dissection. No hemodynamically significant stenosis. Normal variant anatomy with the left gastric artery arising directly from the aorta. SMA: Patent without acute thrombus, aneurysm, or dissection. No hemodynamically significant stenosis. Renals: Patent without acute thrombus, aneurysm, or dissection. No hemodynamically significant stenosis. Two right renal arteries with a single left renal artery. IMA: Patent without acute thrombus, aneurysm, or dissection. No hemodynamically significant stenosis. Inflow: Patent without acute thrombus, aneurysm, or dissection. No hemodynamically significant stenosis. Proximal Outflow: The bilateral common femoral and visualized portions of the superficial and profunda femoral arteries are patent without acute thrombus, aneurysm, or dissection. No hemodynamically significant stenosis. Veins: No obvious venous abnormality within the limitations of this arterial phase study. Review of the MIP images confirms the above findings. NON-VASCULAR Lower chest: No focal airspace consolidation or pleural effusion.Moderate cardiomegaly with biatrial predominance. Posterior bibasilar dependent atelectasis. Hepatobiliary: No mass.No radiopaque stones or wall thickening of the gallbladder.No intrahepatic or extrahepatic biliary ductal dilation. Portal veins are patent. Pancreas: No mass or main ductal dilation.No peripancreatic inflammation or fluid collection. Spleen: Normal size. No mass. Adrenals/Urinary Tract: No adrenal masses. No renal mass. No hydronephrosis or nephrolithiasis. The urinary bladder is distended without focal abnormality. Stomach/Bowel: The stomach is decompressed without focal abnormality. No small bowel wall thickening or inflammation. No small bowel obstruction. Normal appendix. Descending and sigmoid colonic  diverticulosis. No changes of acute diverticulitis. GI Bleed: No extravasation of contrast to suggest active GI bleeding. Lymphatic: No intraabdominal or pelvic lymphadenopathy. Reproductive: No prostatomegaly.No free pelvic fluid. Other: No pneumoperitoneum, ascites, or mesenteric inflammation. Musculoskeletal: No acute fracture or destructive lesion.Chronic compression fracture of L1 with vertebroplasty cement. Retropulsion with moderate to severe narrowing of the spinal canal. Mild compression deformity of L2, unchanged. L4 through S1 posterior fusion hardware with intervertebral disc spacer at L4-L5. Mild anterolisthesis of L4 on L5. Right hip arthroplasty is anatomically aligned without dislocation. IMPRESSION: VASCULAR No aortic aneurysm, intramural hematoma, or aortic dissection. NON-VASCULAR 1. Descending and sigmoid colonic diverticulosis. No extravasation of contrast to suggest active GI bleeding. 2. Moderate cardiomegaly with biatrial predominance. Electronically Signed   By: Rance Burrows M.D.   On: 05/21/2023 15:20     Labs:   Basic Metabolic Panel: Recent Labs  Lab 05/21/23 1125 05/22/23 0603 05/23/23 0529  NA 138 138 133*  K 4.7 3.7 3.7  CL 101 104 105  CO2 27 30 23   GLUCOSE 107* 91 78  BUN 20 16 14   CREATININE 1.31* 1.16 0.99  CALCIUM  9.6 8.9 8.5*   GFR Estimated Creatinine Clearance: 63.5 mL/min (by C-G formula based on SCr of 0.99 mg/dL). Liver Function Tests: Recent Labs  Lab 05/21/23 1125  AST 33  ALT 11  ALKPHOS 62  BILITOT 1.5*  PROT 7.0  ALBUMIN 3.6   No results for input(s): "LIPASE", "AMYLASE" in the last 168 hours. No results for input(s): "AMMONIA" in the last 168 hours. Coagulation profile Recent Labs  Lab 05/21/23 1404 05/22/23  9811  INR 1.6* 1.4*    CBC: Recent Labs  Lab 05/21/23 1404 05/22/23 0603 05/22/23 1606 05/23/23 0529  WBC 8.5 7.2  --  7.2  HGB 10.5* 9.6* 9.7* 10.0*  HCT 33.7* 30.8* 30.4* 31.9*  MCV 99.1 96.9  --  99.4   PLT 217 183  --  176   Cardiac Enzymes: No results for input(s): "CKTOTAL", "CKMB", "CKMBINDEX", "TROPONINI" in the last 168 hours. BNP: Invalid input(s): "POCBNP" CBG: No results for input(s): "GLUCAP" in the last 168 hours. D-Dimer No results for input(s): "DDIMER" in the last 72 hours. Hgb A1c No results for input(s): "HGBA1C" in the last 72 hours. Lipid Profile No results for input(s): "CHOL", "HDL", "LDLCALC", "TRIG", "CHOLHDL", "LDLDIRECT" in the last 72 hours. Thyroid  function studies No results for input(s): "TSH", "T4TOTAL", "T3FREE", "THYROIDAB" in the last 72 hours.  Invalid input(s): "FREET3" Anemia work up Recent Labs    05/21/23 1404  VITAMINB12 6,352*  FOLATE >40.0  FERRITIN 23*  TIBC 407  IRON 51  RETICCTPCT 2.0   Microbiology No results found for this or any previous visit (from the past 240 hours).   Discharge Instructions:   Discharge Instructions     Discharge instructions   Complete by: As directed    Soft diet for 2 weeks Resume xarelto  Sunday   Increase activity slowly   Complete by: As directed       Allergies as of 05/23/2023       Reactions   Ciprofloxacin Other (See Comments)   Contraindicated because of increased risk of rupture of ascending thoracic aortic aneurysm        Medication List     STOP taking these medications    methocarbamol  750 MG tablet Commonly known as: ROBAXIN    naproxen 500 MG tablet Commonly known as: NAPROSYN       TAKE these medications    acetaminophen  500 MG tablet Commonly known as: TYLENOL  Take 1,000 mg by mouth in the morning and at bedtime.   Adv Turmeric Curcumin Complex Caps Take 2 capsules by mouth in the morning and at bedtime.   B-12 5000 MCG Caps Take 1 capsule by mouth daily at 12 noon.   Entresto  24-26 MG Generic drug: sacubitril -valsartan  Take 1 tablet by mouth 2 (two) times daily.   Farxiga  5 MG Tabs tablet Generic drug: dapagliflozin  propanediol Take 1 tablet by  mouth once daily   hydrocortisone 25 MG suppository Commonly known as: ANUSOL-HC Place 1 suppository (25 mg total) rectally every 12 (twelve) hours for 13 days.   Klor-Con  M20 20 MEQ tablet Generic drug: potassium chloride  SA Take 20 mEq by mouth daily.   metoprolol  tartrate 50 MG tablet Commonly known as: LOPRESSOR  TAKE 1 & 1/2 (ONE & ONE-HALF) TABLETS BY MOUTH TWICE DAILY What changed: See the new instructions.   multivitamin with minerals Tabs tablet Take 1 tablet by mouth daily.   polyethylene glycol 17 g packet Commonly known as: MIRALAX / GLYCOLAX Take 17 g by mouth daily as needed for mild constipation.   rivaroxaban  20 MG Tabs tablet Commonly known as: Xarelto  Take 1 tablet (20 mg total) by mouth daily with supper. Start taking on: May 25, 2023 What changed:  These instructions start on May 25, 2023. If you are unsure what to do until then, ask your doctor or other care provider. Another medication with the same name was removed. Continue taking this medication, and follow the directions you see here.   rosuvastatin  5 MG tablet Commonly  known as: CRESTOR  Take 1 tablet (5 mg total) by mouth daily.   tamsulosin 0.4 MG Caps capsule Commonly known as: FLOMAX TAKE 1 CAPSULE BY MOUTH TWICE DAILY 30 MINUTES FOLLOWING  THE  SAME  MEAL  EACH  DAY What changed: See the new instructions.   torsemide  20 MG tablet Commonly known as: DEMADEX  Take 1-2 tablets (20-40 mg total) by mouth 2 (two) times daily. Take one tablet 20 mg by mouth twice a day on Tuesday, Thursday, Saturday and Sunday. Take two tablets 40 mg by mouth twice a day on Monday, Wednesday and Friday.   traMADol 50 MG tablet Commonly known as: ULTRAM Take 2 tablets (100 mg total) by mouth every 8 (eight) hours as needed. What changed: reasons to take this        Follow-up Information     Wayne Haines, MD Follow up in 1 week(s).   Specialty: Internal Medicine Why: cbc Contact information: [address] [place] [address] [phone]         Felecia Hopper, MD Follow up.   Specialty: Gastroenterology Why: As needed, If symptoms worsen Contact information: [address] [place] [address] [phone]                  Time coordinating discharge: 45 minutes  Signed:  Enrigue Harvard DO  [facility] 05/23/2023, 2:01 PM

## 2023-05-23 NOTE — Anesthesia Postprocedure Evaluation (Signed)
 Anesthesia Post Note  Patient: Aaron Richmond  Procedure(s) Performed: COLONOSCOPY     Patient location during evaluation: Endoscopy Anesthesia Type: MAC Level of consciousness: awake and alert, patient cooperative and oriented Pain management: pain level controlled Vital Signs Assessment: post-procedure vital signs reviewed and stable Respiratory status: nonlabored ventilation, spontaneous breathing and respiratory function stable Cardiovascular status: blood pressure returned to baseline and stable Postop Assessment: no apparent nausea or vomiting Anesthetic complications: no   No notable events documented.  Last Vitals:  Vitals:   05/23/23 1200 05/23/23 1210  BP: (!) 105/42 (!) 100/56  Pulse: 89 78  Resp: 20 20  Temp:    SpO2: 97% 92%    Last Pain:  Vitals:   05/23/23 1210  TempSrc:   PainSc: 0-No pain                 Jaquanda Wickersham,E. Jesse Nosbisch

## 2023-05-23 NOTE — Op Note (Signed)
 [facility] Patient Name: Aaron Richmond Procedure Date: 05/23/2023 MRN: [id number] Attending MD: Felecia Hopper , MD, [id number] Date of Birth: 1945-09-02 CSN: [id number] Age: 78 Admit Type: Inpatient Procedure:                Colonoscopy Indications:              Hematochezia Providers:                Felecia Hopper, MD, Bradley Caffey, Nohemi Batters, Technician Referring MD:              Medicines:                Sedation Administered by an Anesthesia Professional Complications:            No immediate complications. Estimated Blood Loss:     Estimated blood loss was minimal. Procedure:                Pre-Anesthesia Assessment:                           - Prior to the procedure, a History and Physical                            was performed, and patient medications and                            allergies were reviewed. The patient's tolerance of                            previous anesthesia was also reviewed. The risks                            and benefits of the procedure and the sedation                            options and risks were discussed with the patient.                            All questions were answered, and informed consent                            was obtained. Prior Anticoagulants: The patient has                            taken Xarelto  (rivaroxaban ), last dose was 2 days                            prior to procedure. ASA Grade Assessment: III - A                            patient with severe systemic disease. After  reviewing the risks and benefits, the patient was                            deemed in satisfactory condition to undergo the                            procedure.                           After obtaining informed consent, the colonoscope                            was passed under direct vision. Throughout the                            procedure, the patient's blood  pressure, pulse, and                            oxygen saturations were monitored continuously. The                            PCF-HQ190L ([id number]) Olympus colonoscope was                            introduced through the anus and advanced to the the                            terminal ileum, with identification of the                            appendiceal orifice and IC valve. The colonoscopy                            was performed without difficulty. The patient                            tolerated the procedure well. The quality of the                            bowel preparation was fair. The terminal ileum,                            ileocecal valve, appendiceal orifice, and rectum                            were photographed. Scope In: 11:35:11 AM Scope Out: 11:44:36 AM Scope Withdrawal Time: 0 hours 6 minutes 9 seconds  Total Procedure Duration: 0 hours 9 minutes 25 seconds  Findings:      The perianal and digital rectal examinations were normal.      The terminal ileum appeared normal.      Multiple diverticula were found in the entire colon.      Internal hemorrhoids were found during retroflexion. The hemorrhoids       were medium-sized. Impression:               -  Preparation of the colon was fair.                           - The examined portion of the ileum was normal.                           - Diverticulosis in the entire examined colon.                           - Internal hemorrhoids.                           - No specimens collected. Moderate Sedation:      Moderate (conscious) sedation was personally administered by an       anesthesia professional. The following parameters were monitored: oxygen       saturation, heart rate, blood pressure, and response to care. Recommendation:           - Return patient to hospital ward for ongoing care.                           - Soft diet.                           - Continue present medications. Procedure Code(s):         --- Professional ---                           [phone], Colonoscopy, flexible; diagnostic, including                            collection of specimen(s) by brushing or washing,                            when performed (separate procedure) Diagnosis Code(s):        --- Professional ---                           K64.8, Other hemorrhoids                           K92.1, Melena (includes Hematochezia)                           K57.30, Diverticulosis of large intestine without                            perforation or abscess without bleeding CPT copyright 2022 American [employer] Association. All rights reserved. The codes documented in this report are preliminary and upon coder review may  be revised to meet current compliance requirements. Felecia Hopper, MD Felecia Hopper, MD 05/23/2023 11:57:36 AM Number of Addenda: 0

## 2023-05-23 NOTE — Anesthesia Preprocedure Evaluation (Addendum)
 Anesthesia Evaluation  Patient identified by MRN, date of birth, ID band Patient awake    Reviewed: Allergy & Precautions, NPO status , Patient's Chart, lab work & pertinent test results, reviewed documented beta blocker date and time   History of Anesthesia Complications Negative for: history of anesthetic complications  Airway Mallampati: II  TM Distance: >3 FB Neck ROM: Full    Dental  (+) Dental Advisory Given   Pulmonary sleep apnea and Continuous Positive Airway Pressure Ventilation , PE   breath sounds clear to auscultation       Cardiovascular hypertension, Pt. on medications and Pt. on home beta blockers (-) angina + Peripheral Vascular Disease (Asc aortic aneurysm) and +CHF (Entresto )  + dysrhythmias Atrial Fibrillation  Rhythm:Irregular Rate:Normal  '23 ECHO: EF 50 to 55%.  1. The LV has low normal function, no regional wall motion abnormalities. The left ventricular internal cavity size was mildly to moderately dilated.   2. RVF is normal. The right ventricular size is normal. There is normal pulmonary artery systolic pressure.   3. Left atrial size was severely dilated.   4. The mitral valve is normal in structure. Mild to moderate mitral valve regurgitation. No evidence of mitral stenosis.   5. The aortic valve is normal in structure. Aortic valve regurgitation is moderate. No aortic stenosis is present.   6. Aneurysm of the ascending aorta, measuring 51 mm.     Neuro/Psych negative neurological ROS     GI/Hepatic Neg liver ROS,,,Rectal bleeding   Endo/Other  negative endocrine ROS    Renal/GU Renal InsufficiencyRenal disease     Musculoskeletal  (+) Arthritis ,    Abdominal   Peds  Hematology  (+) Blood dyscrasia (Hb 10.0, plt 176k) Xarelto : last dose 05/20/2023   Anesthesia Other Findings Prostate cancer  Reproductive/Obstetrics                             Anesthesia  Physical Anesthesia Plan  ASA: 3  Anesthesia Plan: MAC   Post-op Pain Management: Minimal or no pain anticipated   Induction:   PONV Risk Score and Plan: 1 and Treatment may vary due to age or medical condition  Airway Management Planned: Natural Airway and Simple Face Mask  Additional Equipment: None  Intra-op Plan:   Post-operative Plan:   Informed Consent: I have reviewed the patients History and Physical, chart, labs and discussed the procedure including the risks, benefits and alternatives for the proposed anesthesia with the patient or authorized representative who has indicated his/her understanding and acceptance.     Dental advisory given  Plan Discussed with: CRNA and Surgeon  Anesthesia Plan Comments:         Anesthesia Quick Evaluation

## 2023-05-23 NOTE — Brief Op Note (Signed)
 05/23/2023  12:45 PM  PATIENT:  Aaron Richmond  78 y.o. male  PRE-OPERATIVE DIAGNOSIS:  Rectal bleeding  POST-OPERATIVE DIAGNOSIS:  Diverticulosis; Hemorrhoids; Poor prep; No bleeding  PROCEDURE:  Procedure(s): COLONOSCOPY (N/A)  SURGEON:  Surgeons and Role:    * Aleeyah Bensen, MD - Primary  Findings ----------- - Colonoscopy showed fair prep, diverticulosis and internal hemorrhoids.  No evidence of active bleeding.  Recommendations ------------------------- - Start Anusol suppository twice a day for next 2 weeks - Okay to resume Xarelto  tomorrow from GI standpoint - Okay to discharge from GI standpoint.  GI will sign off.  Call us  back if needed.  Felecia Hopper MD, FACP 05/23/2023, 12:46 PM  Contact #  [phone]

## 2023-05-25 ENCOUNTER — Encounter (HOSPITAL_COMMUNITY): Payer: Self-pay | Admitting: Gastroenterology

## 2023-05-26 DIAGNOSIS — K5730 Diverticulosis of large intestine without perforation or abscess without bleeding: Secondary | ICD-10-CM | POA: Diagnosis not present

## 2023-05-26 DIAGNOSIS — K648 Other hemorrhoids: Secondary | ICD-10-CM | POA: Diagnosis not present

## 2023-05-26 DIAGNOSIS — I129 Hypertensive chronic kidney disease with stage 1 through stage 4 chronic kidney disease, or unspecified chronic kidney disease: Secondary | ICD-10-CM | POA: Diagnosis not present

## 2023-05-26 DIAGNOSIS — N1831 Chronic kidney disease, stage 3a: Secondary | ICD-10-CM | POA: Diagnosis not present

## 2023-05-27 DIAGNOSIS — G4733 Obstructive sleep apnea (adult) (pediatric): Secondary | ICD-10-CM | POA: Diagnosis not present

## 2023-05-29 DIAGNOSIS — M5416 Radiculopathy, lumbar region: Secondary | ICD-10-CM | POA: Diagnosis not present

## 2023-05-30 ENCOUNTER — Encounter: Payer: Self-pay | Admitting: Neurology

## 2023-06-03 ENCOUNTER — Other Ambulatory Visit: Payer: Self-pay

## 2023-06-03 ENCOUNTER — Other Ambulatory Visit (HOSPITAL_COMMUNITY): Payer: Self-pay

## 2023-06-03 DIAGNOSIS — R202 Paresthesia of skin: Secondary | ICD-10-CM

## 2023-06-04 DIAGNOSIS — M4726 Other spondylosis with radiculopathy, lumbar region: Secondary | ICD-10-CM | POA: Diagnosis not present

## 2023-06-04 DIAGNOSIS — M4316 Spondylolisthesis, lumbar region: Secondary | ICD-10-CM | POA: Diagnosis not present

## 2023-06-04 DIAGNOSIS — M5116 Intervertebral disc disorders with radiculopathy, lumbar region: Secondary | ICD-10-CM | POA: Diagnosis not present

## 2023-06-04 DIAGNOSIS — M48061 Spinal stenosis, lumbar region without neurogenic claudication: Secondary | ICD-10-CM | POA: Diagnosis not present

## 2023-06-05 DIAGNOSIS — M48061 Spinal stenosis, lumbar region without neurogenic claudication: Secondary | ICD-10-CM | POA: Diagnosis not present

## 2023-06-05 DIAGNOSIS — M5416 Radiculopathy, lumbar region: Secondary | ICD-10-CM | POA: Diagnosis not present

## 2023-06-05 DIAGNOSIS — M5117 Intervertebral disc disorders with radiculopathy, lumbosacral region: Secondary | ICD-10-CM | POA: Diagnosis not present

## 2023-06-12 DIAGNOSIS — M5416 Radiculopathy, lumbar region: Secondary | ICD-10-CM | POA: Diagnosis not present

## 2023-06-16 ENCOUNTER — Telehealth: Payer: Self-pay | Admitting: *Deleted

## 2023-06-16 NOTE — Telephone Encounter (Signed)
   Pre-operative Risk Assessment    Patient Name: Aaron Richmond  DOB: 1945-11-18 MRN: [id number]   Date of last office visit: 03/17/23 DR. MUNLEY Date of next office visit: 08/11/23 DR. MUNLEY   Request for Surgical Clearance    Procedure:  T10-11, T11-12, T12-L1, L1-2, L2-3, L3-4 POSTERIOR LATERAL FUSION  Date of Surgery:  Clearance TBD                                Surgeon:  DR. Gearl Keens  Surgeon's Group or Practice Name:  [facility] Phone number:  [phone] Fax number:  [phone] ATTN: Sherian Dimitri EXT [id number]   Type of Clearance Requested:   - Medical  - Pharmacy:  Hold Rivaroxaban  (Xarelto )     Type of Anesthesia:  General    Additional requests/questions:    Aaron Richmond   06/16/2023, 11:37 AM

## 2023-06-17 DIAGNOSIS — G4733 Obstructive sleep apnea (adult) (pediatric): Secondary | ICD-10-CM | POA: Diagnosis not present

## 2023-06-19 ENCOUNTER — Telehealth: Payer: Self-pay

## 2023-06-19 NOTE — Telephone Encounter (Signed)
 Patient with diagnosis of atrial fibrillation and prior PE on Xarelto  for anticoagulation.    Procedure:  T10-11, T11-12, T12-L1, L1-2, L2-3, L3-4 POSTERIOR LATERAL FUSION   Date of Surgery:  Clearance TBD      CHA2DS2-VASc Score = 5   This indicates a 7.2% annual risk of stroke. The patient's score is based upon: CHF History: 1 HTN History: 1 Diabetes History: 0 Stroke History: 0 Vascular Disease History: 1 Age Score: 2 Gender Score: 0   CrCl 70 Platelet count 176  Per chart has history of PE x 2 going back prior to 2015  Per office protocol, patient can hold Xarelto  for 3 days prior to procedure.   Patient will not need bridging with Lovenox (enoxaparin) around procedure.  **This guidance is not considered finalized until pre-operative APP has relayed final recommendations.**

## 2023-06-19 NOTE — Telephone Encounter (Signed)
 S/W pt and scheduled TELE Preop appt 06/25/23. med rec and consent done

## 2023-06-19 NOTE — Telephone Encounter (Signed)
 Primary Cardiologist:None   Preoperative team, please contact this patient and set up a phone call appointment for further preoperative risk assessment. Please obtain consent and complete medication review. Thank you for your help.   I confirm that guidance regarding antiplatelet and oral anticoagulation therapy has been completed and, if necessary, noted below.  Per chart has history of PE x 2 going back prior to 2015 Per office protocol, patient can hold Xarelto  for 3 days prior to procedure.   Patient will not need bridging with Lovenox (enoxaparin) around procedure.  I also confirmed the patient resides in the state of [place] . As per [facility] Medical Board telemedicine laws, the patient must reside in the state in which the provider is licensed.   Gerldine Koch, NP-C  06/19/2023, 8:38 AM [address], [address] [place], [place] [address] Office [phone] Fax [phone]

## 2023-06-19 NOTE — Telephone Encounter (Signed)
 med rec and consent done     Patient Consent for Virtual Visit        Aaron Richmond has provided verbal consent on 06/19/2023 for a virtual visit (video or telephone).   CONSENT FOR VIRTUAL VISIT FOR:  Aaron Richmond  By participating in this virtual visit I agree to the following:  I hereby voluntarily request, consent and authorize [facility] and its employed or contracted physicians, physician assistants, nurse practitioners or other licensed health care professionals (the Practitioner), to provide me with telemedicine health care services (the "Services) as deemed necessary by the treating Practitioner. I acknowledge and consent to receive the Services by the Practitioner via telemedicine. I understand that the telemedicine visit will involve communicating with the Practitioner through live audiovisual communication technology and the disclosure of certain medical information by electronic transmission. I acknowledge that I have been given the opportunity to request an in-person assessment or other available alternative prior to the telemedicine visit and am voluntarily participating in the telemedicine visit.  I understand that I have the right to withhold or withdraw my consent to the use of telemedicine in the course of my care at any time, without affecting my right to future care or treatment, and that the Practitioner or I may terminate the telemedicine visit at any time. I understand that I have the right to inspect all information obtained and/or recorded in the course of the telemedicine visit and may receive copies of available information for a reasonable fee.  I understand that some of the potential risks of receiving the Services via telemedicine include:  Delay or interruption in medical evaluation due to technological equipment failure or disruption; Information transmitted may not be sufficient (e.g. poor resolution of images) to allow for appropriate medical  decision making by the Practitioner; and/or  In rare instances, security protocols could fail, causing a breach of personal health information.  Furthermore, I acknowledge that it is my responsibility to provide information about my medical history, conditions and care that is complete and accurate to the best of my ability. I acknowledge that Practitioner's advice, recommendations, and/or decision may be based on factors not within their control, such as incomplete or inaccurate data provided by me or distortions of diagnostic images or specimens that may result from electronic transmissions. I understand that the practice of medicine is not an exact science and that Practitioner makes no warranties or guarantees regarding treatment outcomes. I acknowledge that a copy of this consent can be made available to me via my patient portal [facility] MyChart), or I can request a printed copy by calling the office of [facility].    I understand that my insurance will be billed for this visit.   I have read or had this consent read to me. I understand the contents of this consent, which adequately explains the benefits and risks of the Services being provided via telemedicine.  I have been provided ample opportunity to ask questions regarding this consent and the Services and have had my questions answered to my satisfaction. I give my informed consent for the services to be provided through the use of telemedicine in my medical care

## 2023-06-22 ENCOUNTER — Other Ambulatory Visit: Payer: Self-pay | Admitting: Internal Medicine

## 2023-06-22 ENCOUNTER — Other Ambulatory Visit: Payer: Self-pay | Admitting: Cardiology

## 2023-06-23 ENCOUNTER — Ambulatory Visit: Admitting: Cardiology

## 2023-06-25 ENCOUNTER — Ambulatory Visit: Attending: Cardiology | Admitting: Student

## 2023-06-25 ENCOUNTER — Telehealth: Payer: Self-pay

## 2023-06-25 DIAGNOSIS — Z01810 Encounter for preprocedural cardiovascular examination: Secondary | ICD-10-CM | POA: Diagnosis not present

## 2023-06-25 NOTE — Telephone Encounter (Signed)
 Please advise what stress test this pt needs.  I will send a message to Dr. Erma Hay  RN to please help with scheduling and ordering stress test, see notes below.      Preoperative cardiovascular risk assessment.  T10-11, T11-12, T12-L1, L1-2, L2-3, L3-4 posterior lateral fusion by Dr. Lamon Pillow.   Chart reviewed as part of pre-operative protocol coverage. According to the RCRI, patient has a 0.9-6% risk of MACE. Patient reports activity equivalent to 3.63 METS (per DASI).    Based on ACC/AHA guidelines, Aaron Richmond will need stress testing for further risk stratification prior to undergoing surgery. He will be scheduled for a stress test as soon as possible prior to finalizing cardiac risk for surgery.    I will route this recommendation to the requesting party via Epic fax function.   Please call with questions.   Time:   Today, I have spent 8 minutes with the patient with telehealth technology discussing medical history, symptoms, and management plan.       Morey Ar, NP   06/25/2023, 7:35 AM

## 2023-06-25 NOTE — Progress Notes (Signed)
 I will send a message to Dr. Erma Hay  RN to please help with scheduling and ordering stress test, see notes below.    Preoperative cardiovascular risk assessment.  T10-11, T11-12, T12-L1, L1-2, L2-3, L3-4 posterior lateral fusion by Dr. Lamon Pillow.   Chart reviewed as part of pre-operative protocol coverage. According to the RCRI, patient has a 0.9-6% risk of MACE. Patient reports activity equivalent to 3.63 METS (per DASI).    Based on ACC/AHA guidelines, Aaron Richmond will need stress testing for further risk stratification prior to undergoing surgery. He will be scheduled for a stress test as soon as possible prior to finalizing cardiac risk for surgery.    I will route this recommendation to the requesting party via Epic fax function.   Please call with questions.   Time:   Today, I have spent 8 minutes with the patient with telehealth technology discussing medical history, symptoms, and management plan.       Morey Ar, NP   06/25/2023, 7:35 AM

## 2023-06-25 NOTE — Progress Notes (Signed)
 Virtual Visit via Telephone Note   Because of Aaron Richmond's co-morbid illnesses, he is at least at moderate risk for complications without adequate follow up.  This format is felt to be most appropriate for this patient at this time.  The patient did not have access to video technology/had technical difficulties with video requiring transitioning to audio format only (telephone).  All issues noted in this document were discussed and addressed.  No physical exam could be performed with this format.  Please refer to the patient's chart for his consent to telehealth for [facility].  Evaluation Performed:  Preoperative cardiovascular risk assessment _____________   Date:  06/25/2023   Patient ID:  Aaron Richmond, DOB [birth year], MRN [id number] Patient Location:  Home Provider location:   Office  Primary Care Provider:  Wayne Haines, MD Primary Cardiologist:  None  Chief Complaint / Patient Profile   78 y.o. y/o male with a h/o coronary artery calcification, cardiomyopathy, PAF on anticoagulation, ascending aortic aneurysm, mitral valve/aortic valve insufficiency, hypertension, hyperlipidemia, PE, OSA, prostate cancer with mets, CKD who is pending T10-11, T11-12, T12-L1, L1-2, L3-4 posterior lateral fusion by Dr. Lamon Pillow and presents today for telephonic preoperative cardiovascular risk assessment.  History of Present Illness    Aaron Richmond is a 78 y.o. male who presents via audio/video conferencing for a telehealth visit today.  Pt was last seen in cardiology clinic on 03/17/2023 by Dr. Sandee Crook.  At that time Aaron Richmond was stable from a cardiac standpoint.  The patient is now pending procedure as outlined above. Since his last visit, patient denies shortness of breath, dyspnea on exertion, lower extremity edema, orthopnea or PND. No chest pain, pressure, or tightness. No palpitations. His activity has been very limited secondary to hip fracture in January 2025 and  progressive back pain. Patient reports post hip fracture with ORIF he went to inpatient rehab for 3 weeks and had a short period of home PT. Since that time he has not been able to do much activity. He ambulates with a walker. He is able to do some walking around his home.   Past Medical History    Past Medical History:  Diagnosis Date   Aneurysm of aortic root    Overview:  Last Assessment & Plan:  Planning to see Dr Nicanor Barge for evaluation of 5cm aneurysm.  - will check PFT in prep for possible procedure / SGY   Aortic atherosclerosis (HCC) 06/14/2022   Aortic regurgitation    Aortic valve insufficiency    Ascending aortic aneurysm (HCC) 09/11/2016   Atherosclerotic vascular disease 05/27/2022   Atrial fibrillation (HCC)    Atrial fibrillation with RVR (HCC) 07/05/2022   BMI 27.0-27.9,adult 06/14/2022   BPH (benign prostatic hyperplasia)    Cardiomyopathy (HCC) 09/11/2016   CKD (chronic kidney disease) stage 2, GFR 60-89 ml/min 11/23/2021   Coronary artery calcification seen on CT scan 10/13/2017   Essential hypertension 06/14/2022   High risk medication use 07/31/2022   History of cardiomyopathy 11/09/2021   History of pulmonary embolism 09/24/2012   Overview:  Last Assessment & Plan:  Hx PE x 2 per notes, ? 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  - needs auto-titration study by [employer] or local company, data to RB  to adjust his home device.    Prediabetes    Preoperative clearance 10/13/2017   Primary osteoarthritis involving multiple joints 06/14/2022   Prostate cancer metastatic to bone (HCC) 06/14/2022   Pulmonary embolism (HCC)    Rhinitis    Seasonal allergies 06/14/2022   Spondylolisthesis of lumbar region 12/12/2021   Stage 3a chronic kidney disease (HCC) 06/14/2022   Past Surgical History:  Procedure Laterality Date   APPENDECTOMY     COLONOSCOPY N/A 05/23/2023   Procedure: COLONOSCOPY;  Surgeon: Felecia Hopper, MD;  Location: [facility];  Service: Gastroenterology;  Laterality: N/A;   HERNIA REPAIR     LUMBAR LAMINECTOMY  02/06/2022   Facetectom & foraminotomy for decompression of the cauda equina & nerve root L4-5, Alcide Humble, MD   TONSILLECTOMY      Allergies  Allergies  Allergen Reactions   Ciprofloxacin Other (See Comments)    Contraindicated because of increased risk of rupture of ascending thoracic aortic aneurysm    Home Medications    Prior to Admission medications   Medication Sig Start Date End Date Taking? Authorizing Provider  acetaminophen  (TYLENOL ) 500 MG tablet Take 1,000 mg by mouth in the morning and at bedtime.    [provider]  Cyanocobalamin  (B-12) 5000 MCG CAPS Take 1 capsule by mouth daily at 12 noon.    [provider]  ENTRESTO  24-26 MG Take 1 tablet by mouth 2 (two) times daily. 03/23/23   [provider]  FARXIGA  5 MG TABS tablet Take 1 tablet by mouth once daily 03/03/23   Mitcheal Amy, Reymundo Caulk, MD  KLOR-CON  M20 20 MEQ tablet Take 20 mEq by mouth daily. 03/30/23   [provider]  metoprolol  tartrate (LOPRESSOR ) 50 MG tablet TAKE 1 & 1/2 (ONE & ONE-HALF) TABLETS BY MOUTH TWICE DAILY 05/22/23   Mitcheal Amy, Reymundo Caulk, MD  Misc Natural Products (ADV TURMERIC CURCUMIN COMPLEX) CAPS Take 2 capsules by mouth in the morning and at bedtime.    [provider]  Multiple Vitamin (MULTIVITAMIN WITH MINERALS) TABS tablet  Take 1 tablet by mouth daily.    [provider]  polyethylene glycol (MIRALAX  / GLYCOLAX ) 17 g packet Take 17 g by mouth daily as needed for mild constipation. 05/23/23   Vann, Jessica U, DO  rivaroxaban  (XARELTO ) 20 MG TABS tablet Take 1 tablet (20 mg total) by mouth daily with supper. 05/25/23   Vann, Jessica U, DO  rosuvastatin  (CRESTOR ) 5 MG tablet Take 1 tablet by mouth once daily 06/24/23   Hassan Links, MD  tamsulosin  (FLOMAX ) 0.4 MG CAPS capsule TAKE 1 CAPSULE BY MOUTH TWICE DAILY 30 MINUTES FOLLOWING  THE  SAME  MEAL  EACH  DAY Patient taking differently: Take 0.4 mg by mouth in the morning and at bedtime. 03/24/23   Wayne Haines, MD  torsemide  (DEMADEX ) 20 MG tablet Take 1 tablet by mouth twice daily 06/23/23   Wayne Haines, MD  traMADol  (ULTRAM ) 50 MG tablet Take 2 tablets (100 mg total) by mouth every 8 (eight) hours as needed. Patient taking differently: Take 100 mg by mouth every 8 (eight) hours as needed for moderate pain (pain  score 4-6) or severe pain (pain score 7-10). 05/20/23   Wayne Haines, MD    Physical Exam    Vital Signs:  Aaron Richmond does not have vital signs available for review today.  Given telephonic nature of communication, physical exam is limited. AAOx3. NAD. Normal affect.  Speech and respirations are unlabored.   Assessment & Plan    Primary Cardiologist: None  Preoperative cardiovascular risk assessment.  T10-11, T11-12, T12-L1, L1-2, L2-3, L3-4 posterior lateral fusion by Dr. Lamon Pillow.  Chart reviewed as part of pre-operative protocol coverage. According to the RCRI, patient has a 0.9-6% risk of MACE. Patient reports activity equivalent to 3.63 METS (per DASI).   Based on ACC/AHA guidelines, Aaron Richmond will need stress testing for further risk stratification prior to undergoing surgery. He will be scheduled for a stress test as soon as possible prior to finalizing cardiac risk for surgery.   I will route this recommendation to the  requesting party via Epic fax function.  Please call with questions.  Time:   Today, I have spent 8 minutes with the patient with telehealth technology discussing medical history, symptoms, and management plan.     Morey Ar, NP  06/25/2023, 7:35 AM

## 2023-06-27 DIAGNOSIS — G4733 Obstructive sleep apnea (adult) (pediatric): Secondary | ICD-10-CM | POA: Diagnosis not present

## 2023-07-01 ENCOUNTER — Ambulatory Visit: Attending: Cardiology | Admitting: Cardiology

## 2023-07-01 VITALS — BP 116/50 | HR 79 | Ht 70.0 in | Wt 170.0 lb

## 2023-07-01 DIAGNOSIS — I429 Cardiomyopathy, unspecified: Secondary | ICD-10-CM | POA: Diagnosis not present

## 2023-07-01 DIAGNOSIS — I7121 Aneurysm of the ascending aorta, without rupture: Secondary | ICD-10-CM | POA: Diagnosis not present

## 2023-07-01 DIAGNOSIS — Z01810 Encounter for preprocedural cardiovascular examination: Secondary | ICD-10-CM

## 2023-07-01 DIAGNOSIS — I700 Atherosclerosis of aorta: Secondary | ICD-10-CM

## 2023-07-01 DIAGNOSIS — I10 Essential (primary) hypertension: Secondary | ICD-10-CM | POA: Diagnosis not present

## 2023-07-01 DIAGNOSIS — I2510 Atherosclerotic heart disease of native coronary artery without angina pectoris: Secondary | ICD-10-CM | POA: Diagnosis not present

## 2023-07-01 DIAGNOSIS — I351 Nonrheumatic aortic (valve) insufficiency: Secondary | ICD-10-CM | POA: Diagnosis not present

## 2023-07-01 DIAGNOSIS — E782 Mixed hyperlipidemia: Secondary | ICD-10-CM | POA: Diagnosis not present

## 2023-07-01 NOTE — Patient Instructions (Signed)
 Medication Instructions:  Your physician recommends that you continue on your current medications as directed. Please refer to the Current Medication list given to you today.  *If you need a refill on your cardiac medications before your next appointment, please call your pharmacy*   Lab Work: None ordered If you have labs (blood work) drawn today and your tests are completely normal, you will receive your results only by: MyChart Message (if you have MyChart) OR A paper copy in the mail If you have any lab test that is abnormal or we need to change your treatment, we will call you to review the results.  Testing/Procedures: Your physician has requested that you have an echocardiogram. Echocardiography is a painless test that uses sound waves to create images of your heart. It provides your doctor with information about the size and shape of your heart and how well your heart's chambers and valves are working. This procedure takes approximately one hour. There are no restrictions for this procedure. Please do NOT wear cologne, perfume, aftershave, or lotions (deodorant is allowed). Please arrive 15 minutes prior to your appointment time.  Please note: We ask at that you not bring children with you during ultrasound (echo/ vascular) testing. Due to room size and safety concerns, children are not allowed in the ultrasound rooms during exams. Our front office staff cannot provide observation of children in our lobby area while testing is being conducted. An adult accompanying a patient to their appointment will only be allowed in the ultrasound room at the discretion of the ultrasound technician under special circumstances. We apologize for any inconvenience.    Your cardiac CT will be scheduled at one of the below locations:   [facility] [address]  [place], [place] [address]  All radiology patients and guests should use entrance C2 at [facility],  accessed from [facility], even though the hospital's physical address listed is [address].  If scheduled at the Heart and V[facility] Tower at [address], please enter the parking lot using the [address] entrance and use the FREE valet service at the patient drop-off area. Enter the buidling and check-in with registration on the main floor.  Please follow these instructions carefully (unless otherwise directed):  An IV will be required for this test and Nitroglycerin will be given.  Hold all erectile dysfunction medications at least 3 days (72 hrs) prior to test. (Ie viagra, cialis, sildenafil, tadalafil, etc)   On the Night Before the Test: Be sure to Drink plenty of water. Do not consume any caffeinated/decaffeinated beverages or chocolate 12 hours prior to your test. Do not take any antihistamines 12 hours prior to your test.  On the Day of the Test: Drink plenty of water until 1 hour prior to the test. Do not eat any food 1 hour prior to test. You may take your regular medications prior to the test.  Take metoprolol  (Lopressor ) 100 mg (2 tablets) two hours prior to test. If you take torsemide , please HOLD on the morning of the test.       After the Test: Drink plenty of water. After receiving IV contrast, you may experience a mild flushed feeling. This is normal. On occasion, you may experience a mild rash up to 24 hours after the test. This is not dangerous. If this occurs, you can take Benadryl 25 mg, Zyrtec, Claritin, or Allegra and increase your fluid intake. (Patients taking Tikosyn should avoid Benadryl,  and may take Zyrtec, Claritin, or Allegra) If you experience trouble breathing, this can be serious. If it is severe call 911 IMMEDIATELY. If it is mild, please call our office.  We will call to schedule your test 2-4 weeks out understanding that some insurance companies will need an authorization prior to the service being performed.   For  more information and frequently asked questions, please visit our website : [email or web address]  For non-scheduling related questions, please contact the cardiac imaging nurse navigator should you have any questions/concerns: Cardiac Imaging Nurse Navigators Direct Office Dial: [phone]   For scheduling needs, including cancellations and rescheduling, please call [place], [phone].   Follow-Up: At [facility], you and your health needs are our priority.  As part of our continuing mission to provide you with exceptional heart care, we have created designated Provider Care Teams.  These Care Teams include your primary Cardiologist (physician) and Advanced Practice Providers (APPs -  Physician Assistants and Nurse Practitioners) who all work together to provide you with the care you need, when you need it.  We recommend signing up for the patient portal called MyChart.  Sign up information is provided on this After Visit Summary.  MyChart is used to connect with patients for Virtual Visits (Telemedicine).  Patients are able to view lab/test results, encounter notes, upcoming appointments, etc.  Non-urgent messages can be sent to your provider as well.   To learn more about what you can do with MyChart, go to [email or web address].    Your next appointment:   6 month(s)  The format for your next appointment:   In Person  Provider:   Jennifer Crape, MD   Other Instructions Echocardiogram An echocardiogram is a test that uses sound waves (ultrasound) to produce images of the heart. Images from an echocardiogram can provide important information about: Heart size and shape. The size and thickness and movement of your heart's walls. Heart muscle function and strength. Heart valve function or if you have stenosis. Stenosis is when the heart valves are too narrow. If blood is flowing backward through the heart valves (regurgitation). A tumor or infectious growth  around the heart valves. Areas of heart muscle that are not working well because of poor blood flow or injury from a heart attack. Aneurysm detection. An aneurysm is a weak or damaged part of an artery wall. The wall bulges out from the normal force of blood pumping through the body. Tell a health care provider about: Any allergies you have. All medicines you are taking, including vitamins, herbs, eye drops, creams, and over-the-counter medicines. Any blood disorders you have. Any surgeries you have had. Any medical conditions you have. Whether you are pregnant or may be pregnant. What are the risks? Generally, this is a safe test. However, problems may occur, including an allergic reaction to dye (contrast) that may be used during the test. What happens before the test? No specific preparation is needed. You may eat and drink normally. What happens during the test? You will take off your clothes from the waist up and put on a hospital gown. Electrodes or electrocardiogram (ECG)patches may be placed on your chest. The electrodes or patches are then connected to a device that monitors your heart rate and rhythm. You will lie down on a table for an ultrasound exam. A gel will be applied to your chest to help sound waves pass through your skin. A handheld device, called a transducer, will be pressed against your chest and moved over your  heart. The transducer produces sound waves that travel to your heart and bounce back (or echo back) to the transducer. These sound waves will be captured in real-time and changed into images of your heart that can be viewed on a video monitor. The images will be recorded on a computer and reviewed by your health care provider. You may be asked to change positions or hold your breath for a short time. This makes it easier to get different views or better views of your heart. In some cases, you may receive contrast through an IV in one of your veins. This can  improve the quality of the pictures from your heart. The procedure may vary among health care providers and hospitals.   What can I expect after the test? You may return to your normal, everyday life, including diet, activities, and medicines, unless your health care provider tells you not to do that. Follow these instructions at home: It is up to you to get the results of your test. Ask your health care provider, or the department that is doing the test, when your results will be ready. Keep all follow-up visits. This is important. Summary An echocardiogram is a test that uses sound waves (ultrasound) to produce images of the heart. Images from an echocardiogram can provide important information about the size and shape of your heart, heart muscle function, heart valve function, and other possible heart problems. You do not need to do anything to prepare before this test. You may eat and drink normally. After the echocardiogram is completed, you may return to your normal, everyday life, unless your health care provider tells you not to do that. This information is not intended to replace advice given to you by your health care provider. Make sure you discuss any questions you have with your health care provider. Document Revised: 08/17/2019 Document Reviewed: 08/17/2019 Elsevier Patient Education  2021 Elsevier Inc.   Important Information About Sugar

## 2023-07-01 NOTE — Progress Notes (Signed)
 Cardiology Office Note:    Date:  07/01/2023   ID:  Aaron Richmond, DOB January 09, 1945, MRN [id number]  PCP:  Aaron Valeria Mayo, MD  Cardiologist:  Aaron JONELLE Crape, MD   Referring MD: Aaron Valeria Mayo, MD    ASSESSMENT:    1. Preop cardiovascular exam   2. Aneurysm of ascending aorta without rupture (HCC)   3. Aortic atherosclerosis (HCC)   4. Nonrheumatic aortic valve insufficiency   5. Cardiomyopathy, unspecified type (HCC)   6. Coronary artery calcification seen on CT scan   7. Essential hypertension   8. Mixed hyperlipidemia    PLAN:    In order of problems listed above:  Preoperative cardiovascular evaluation: Patient has coronary artery calcification and leads a sedentary lifestyle: In view of this I discussed CT coronary angiography with FFR and he is agreeable.  Further recommendations will be based on the findings of this test. Ascending aortic aneurysm: Significantly enlarged and followed by cardiothoracic and vascular surgery.  I mention to him that he should get in touch with his surgeon to help him assess risks from this standpoint for his significant back surgery. History of cardiomyopathy: I reviewed echocardiogram done last time and it revealed moderately reduced ejection fraction I will have echocardiogram to follow-up on this. Essential hypertension: Blood pressure is stable and diet was emphasized. Permanent atrial fibrillation:I discussed with the patient atrial fibrillation, disease process. Management and therapy including rate and rhythm control, anticoagulation benefits and potential risks were discussed extensively with the patient. Patient had multiple questions which were answered to patient's satisfaction. Patient will be seen in follow-up appointment in 6 months or earlier if the patient has any concerns.    Medication Adjustments/Labs and Tests Ordered: Current medicines are reviewed at length with the patient today.  Concerns regarding medicines are  outlined above.  Orders Placed This Encounter  Procedures   EKG 12-Lead   No orders of the defined types were placed in this encounter.    History of Present Illness:    Aaron Richmond is a 78 y.o. male who is being seen today for the evaluation of preoperative cardiovascular evaluation at the request of Aaron Valeria Mayo, MD. patient is a pleasant 78 year old male.  He has past medical history of essential hypertension, ascending aortic aneurysm, atrial fibrillation, cardiomyopathy, coronary artery calcification seen on CT scan.  He is here for preop assessment for extensive surgery on his back.  He denies any chest pain orthopnea or PND.  He leads a very sedentary lifestyle.  At the time of my evaluation, the patient is alert awake oriented and in no distress.  Past Medical History:  Diagnosis Date   Aneurysm of aortic root    Overview:  Last Assessment & Plan:  Planning to see Dr Army for evaluation of 5cm aneurysm.  - will check PFT in prep for possible procedure / SGY   Aortic atherosclerosis (HCC) 06/14/2022   Aortic regurgitation    Aortic valve insufficiency    Ascending aortic aneurysm (HCC) 09/11/2016   Atherosclerotic vascular disease 05/27/2022   Atrial fibrillation (HCC)    Atrial fibrillation with RVR (HCC) 07/05/2022   BMI 27.0-27.9,adult 06/14/2022   BPH (benign prostatic hyperplasia)    BRBPR (bright red blood per rectum) 05/20/2023   Cardiomyopathy (HCC) 09/11/2016   CKD (chronic kidney disease) stage 2, GFR 60-89 ml/min 11/23/2021   Closed fracture of right hip (HCC) 03/10/2023   Coronary artery calcification seen on CT scan 10/13/2017   Essential hypertension  06/14/2022   Gastrointestinal hemorrhage 05/20/2023   High risk medication use 07/31/2022   History of cardiomyopathy 11/09/2021   History of pulmonary embolism 09/24/2012   Overview:  Last Assessment & Plan:  Hx PE x 2 per notes, ? 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  - needs auto-titration study by [employer] or local company, data to RB to adjust his home device.    OSA (obstructive sleep apnea) 09/13/2022   Prediabetes    Preoperative clearance 10/13/2017   Primary osteoarthritis involving multiple joints 06/14/2022   Prostate cancer metastatic to bone (HCC) 06/14/2022   Pulmonary embolism (HCC)    Rhinitis    Sacral wound 04/11/2023   Seasonal allergies 06/14/2022   Spondylolisthesis of lumbar region 12/12/2021   Stage 3a chronic kidney disease (HCC) 06/14/2022   Unstageable pressure ulcer of sacral region (HCC) 03/10/2023    Past Surgical History:  Procedure Laterality Date   APPENDECTOMY     COLONOSCOPY N/A 05/23/2023   Procedure: COLONOSCOPY;  Surgeon: Elicia Claw, MD;  Location: [facility];  Service: Gastroenterology;  Laterality: N/A;   HERNIA REPAIR     LUMBAR LAMINECTOMY  02/06/2022   Facetectom & foraminotomy for decompression of the cauda equina & nerve root L4-5, Arley PHEBE Dark, MD   TONSILLECTOMY      Current Medications: Current Meds  Medication Sig   acetaminophen  (TYLENOL ) 500 MG tablet Take 1,000 mg by mouth in the morning and at bedtime.   Cyanocobalamin  (B-12) 5000 MCG CAPS Take 1 capsule by mouth daily at 12 noon.   FARXIGA  5 MG TABS tablet Take 1 tablet by mouth once daily   KLOR-CON  M20 20 MEQ tablet Take 20 mEq by mouth daily.   methocarbamol  (ROBAXIN ) 500 MG tablet  Take 500 mg by mouth 4 (four) times daily.   metoprolol  tartrate (LOPRESSOR ) 50 MG tablet TAKE 1 & 1/2 (ONE & ONE-HALF) TABLETS BY MOUTH TWICE DAILY   Multiple Vitamin (MULTIVITAMIN WITH MINERALS) TABS tablet Take 1 tablet by mouth daily.   polyethylene glycol (MIRALAX  / GLYCOLAX ) 17 g packet Take 17 g by mouth daily as needed for mild constipation.   rivaroxaban  (XARELTO ) 20 MG TABS tablet Take 1 tablet (20 mg total) by mouth daily with supper.   rosuvastatin  (CRESTOR ) 5 MG tablet Take 1 tablet by mouth once daily   tamsulosin  (FLOMAX ) 0.4 MG CAPS capsule TAKE 1 CAPSULE BY MOUTH TWICE DAILY 30 MINUTES FOLLOWING  THE  SAME  MEAL  EACH  DAY   torsemide  (DEMADEX ) 20 MG tablet Take 1 tablet by mouth twice daily (Patient taking differently: Take 20 mg by mouth 2 (two) times daily. And patient takes 3 tablets by mouth on Monday, Wednesday, and Friday)   traMADol  (ULTRAM ) 50 MG tablet Take 2 tablets (100 mg total) by mouth every 8 (eight) hours as needed.     Allergies:   Ciprofloxacin   Social History   Socioeconomic History  Marital status: Married    Spouse name: Not on file   Number of children: 0   Years of education: Not on file   Highest education level: Not on file  Occupational History   Occupation: Chief Operating Officer  Tobacco Use   Smoking status: Never   Smokeless tobacco: Never  Vaping Use   Vaping status: Never Used  Substance and Sexual Activity   Alcohol  use: No   Drug use: No   Sexual activity: Not on file  Other Topics Concern   Not on file  Social History Narrative   Not on file   Social Drivers of Health   Financial Resource Strain: Not on file  Food Insecurity: No Food Insecurity (05/21/2023)   Hunger Vital Sign    Worried About Running Out of Food in the Last Year: Never true    Ran Out of Food in the Last Year: Never true  Transportation Needs: No Transportation Needs (05/21/2023)   PRAPARE - Administrator, Civil Service (Medical): No    Lack of  Transportation (Non-Medical): No  Physical Activity: Not on file  Stress: Not on file  Social Connections: Socially Integrated (05/21/2023)   Social Connection and Isolation Panel    Frequency of Communication with Friends and Family: More than three times a week    Frequency of Social Gatherings with Friends and Family: Once a week    Attends Religious Services: More than 4 times per year    Active Member of [employer] or Organizations: Yes    Attends Banker Meetings: 1 to 4 times per year    Marital Status: Married     Family History: The patient's family history includes Alzheimer's disease in his mother; Diabetes Mellitus II in his father; Heart attack in his mother; Stroke in his father and mother.  ROS:   Please see the history of present illness.    All other systems reviewed and are negative.  EKGs/Labs/Other Studies Reviewed:    The following studies were reviewed today:  EKG Interpretation Date/Time:  Tuesday July 01 2023 16:06:46 EDT Ventricular Rate:  79 PR Interval:    QRS Duration:  90 QT Interval:  392 QTC Calculation: 449 R Axis:   6  Text Interpretation: Atrial fibrillation Left ventricular hypertrophy Nonspecific T wave abnormality Abnormal ECG When compared with ECG of 08-Nov-2022 15:27, No significant change was found Confirmed by Edwyna Backers [phone]) on 07/01/2023 4:18:24 PM     Recent Labs: 11/08/2022: NT-Pro BNP 1,382 03/10/2023: Magnesium 2.3 05/21/2023: ALT 11 05/23/2023: BUN 14; Creatinine, Ser 0.99; Hemoglobin 10.0; Platelets 176; Potassium 3.7; Sodium 133  Recent Lipid Panel    Component Value Date/Time   CHOL 122 06/14/2022 1155   TRIG 85 06/14/2022 1155   HDL 45 06/14/2022 1155   CHOLHDL 2.7 06/14/2022 1155   LDLCALC 60 06/14/2022 1155    Physical Exam:    VS:  BP (!) 116/50   Pulse 79   Ht 5' 10 (1.778 m)   Wt 170 lb (77.1 kg)   SpO2 96%   BMI 24.39 kg/m     Wt Readings from Last 3 Encounters:  07/01/23 170 lb (77.1  kg)  05/23/23 171 lb 15.3 oz (78 kg)  05/20/23 170 lb 12.8 oz (77.5 kg)     GEN: Patient is in no acute distress HEENT: Normal NECK: No JVD; No carotid bruits LYMPHATICS: No lymphadenopathy CARDIAC: S1 S2 regular, 2/6 systolic murmur at the apex. RESPIRATORY:  Clear to auscultation  without rales, wheezing or rhonchi  ABDOMEN: Soft, non-tender, non-distended MUSCULOSKELETAL:  No edema; No deformity  SKIN: Warm and dry NEUROLOGIC:  Alert and oriented x 3 PSYCHIATRIC:  Normal affect    Signed, Aaron JONELLE Crape, MD  07/01/2023 4:34 PM    [facility]

## 2023-07-04 ENCOUNTER — Ambulatory Visit: Attending: Cardiology

## 2023-07-04 DIAGNOSIS — I429 Cardiomyopathy, unspecified: Secondary | ICD-10-CM

## 2023-07-04 DIAGNOSIS — I700 Atherosclerosis of aorta: Secondary | ICD-10-CM | POA: Diagnosis not present

## 2023-07-04 DIAGNOSIS — Z01810 Encounter for preprocedural cardiovascular examination: Secondary | ICD-10-CM

## 2023-07-04 DIAGNOSIS — I7121 Aneurysm of the ascending aorta, without rupture: Secondary | ICD-10-CM

## 2023-07-04 DIAGNOSIS — I351 Nonrheumatic aortic (valve) insufficiency: Secondary | ICD-10-CM

## 2023-07-04 LAB — ECHOCARDIOGRAM COMPLETE
AR max vel: 2.04 cm2
AV Area VTI: 2.53 cm2
AV Area mean vel: 1.93 cm2
AV Mean grad: 7 mmHg
AV Peak grad: 11.3 mmHg
Ao pk vel: 1.68 m/s
Calc EF: 36.4 %
P 1/2 time: 467 ms
S' Lateral: 5.9 cm
Single Plane A2C EF: 27.5 %
Single Plane A4C EF: 43.4 %

## 2023-07-09 ENCOUNTER — Other Ambulatory Visit: Payer: Self-pay | Admitting: Internal Medicine

## 2023-07-10 ENCOUNTER — Ambulatory Visit: Admitting: Neurology

## 2023-07-10 ENCOUNTER — Ambulatory Visit: Payer: Self-pay | Admitting: Cardiology

## 2023-07-10 DIAGNOSIS — E782 Mixed hyperlipidemia: Secondary | ICD-10-CM

## 2023-07-10 DIAGNOSIS — R202 Paresthesia of skin: Secondary | ICD-10-CM

## 2023-07-10 DIAGNOSIS — I2510 Atherosclerotic heart disease of native coronary artery without angina pectoris: Secondary | ICD-10-CM

## 2023-07-10 DIAGNOSIS — G5731 Lesion of lateral popliteal nerve, right lower limb: Secondary | ICD-10-CM

## 2023-07-10 NOTE — Procedures (Signed)
 [facility]  [address] [place], [address]  [place], [place] [address] Tel: [phone] Fax: [phone] Test Date:  07/10/2023  Patient: Aaron Richmond DOB: April 16, 1945 Physician: Tonita Blanch, DO  Sex: Male Height: 5' 10 Ref Phys: Suzen Chiquita Pean, NP  ID#: [id number]   Technician:    History: This is a 78 year old man referred for evaluation of right foot drop.  NCV & EMG Findings: Extensive electrodiagnostic testing of the right lower extremity and additional studies of the left shows:  Right superficial peroneal sensory response is absent.  Left superficial peroneal and bilateral sural sensory responses are absent. Right peroneal motor response is absent at the extensor digitorum brevis and tibialis anterior.  Left peroneal motor response is absent at the extensor digitorum brevis, and normal at the tibialis anterior.  Bilateral tibial motor responses are within normal limits.   Bilateral tibial H reflex studies are within normal limits.   Despite maximal activation, no motor unit recruitment is seen in the right tibialis anterior muscle, where there is active fibrillation potentials.  Active and chronic motor axonal loss changes are also present in the right fibularis longus muscle.  Proximal and deep muscles were unable to be tested as the patient is on anticoagulation therapy.  Sparse chronic motor axonal loss changes are seen in the left anterior tibialis muscle, without accompanying active denervation.  Impression: The electrophysiologic findings are most suggestive of very severe, active on chronic, right peroneal mononeuropathy at or above the takeoff to the tibialis anterior muscle.  Of note, needle electrode examination was limited as the patient is on anticoagulation therapy and therefore complete evaluation for lumbosacral radiculopathy is not possible.   ___________________________ Tonita Blanch, DO    Nerve Conduction Studies   Stim Site NR Peak (ms)  Norm Peak (ms) O-P Amp (V) Norm O-P Amp  Left Sup Peroneal Anti Sensory (Ant Lat Mall)  32 C  12 cm    2.8 <4.6 5.6 >3  Right Sup Peroneal Anti Sensory (Ant Lat Mall)  32 C  12 cm *NR  <4.6  >3  Left Sural Anti Sensory (Lat Mall)  32 C  Calf    2.2 <4.6 8.2 >3  Right Sural Anti Sensory (Lat Mall)  32 C  Calf    1.9 <4.6 7.5 >3     Stim Site NR Onset (ms) Norm Onset (ms) O-P Amp (mV) Norm O-P Amp Site1 Site2 Delta-0 (ms) Dist (cm) Vel (m/s) Norm Vel (m/s)  Left Peroneal Motor (Ext Dig Brev)  32 C  Ankle *NR  <6.0  >2.5 B Fib Ankle  0.0  >40  B Fib *NR     Poplt B Fib  0.0  >40  Poplt *NR            Right Peroneal Motor (Ext Dig Brev)  32 C  Ankle *NR  <6.0  >2.5 B Fib Ankle  0.0  >40  B Fib *NR     Poplt B Fib  0.0  >40  Poplt *NR            Left Peroneal TA Motor (Tib Ant)  32 C  Fib Head    4.3 <4.5 3.5 >3 Poplit Fib Head 1.3 8.0 62 >40  Poplit    5.6 <5.7 3.4         Right Peroneal TA Motor (Tib Ant)  32 C  Fib Head *NR  <4.5  >3 Poplit Fib Head  0.0  >40  Poplit *NR  <5.7  Left Tibial Motor (Abd Hall Brev)  32 C  Ankle    4.1 <6.0 4.2 >4 Knee Ankle 10.2 43.0 42 >40  Knee    14.3  2.7         Right Tibial Motor (Abd Hall Brev)  32 C  Ankle    4.7 <6.0 4.1 >4 Knee Ankle 9.4 40.0 43 >40  Knee    14.1  2.6          Electromyography   Side Muscle Ins.Act Fibs Fasc Recrt Amp Dur Poly Activation Comment  Right AntTibialis Nml *2+ Nml *None *- *- *- Nml *ATR  Right Gastroc Nml Nml Nml Nml Nml Nml Nml Nml N/A  Right RectFemoris Nml Nml Nml Nml Nml Nml Nml Nml N/A  Right BicepsFemS Nml Nml Nml Nml Nml Nml Nml Nml N/A  Right Fibularis Long Nml *1+ Nml *SMU *1+ *1+ *1+ Nml N/A  Left AntTibialis Nml Nml Nml *1- *1+ *1+ *1+ Nml N/A  Left Gastroc Nml Nml Nml Nml Nml Nml Nml Nml N/A  Left RectFemoris Nml Nml Nml Nml Nml Nml Nml Nml N/A  Left BicepsFemS Nml Nml Nml Nml Nml Nml Nml Nml N/A      Waveforms:

## 2023-07-14 ENCOUNTER — Encounter: Payer: Self-pay | Admitting: Internal Medicine

## 2023-07-14 ENCOUNTER — Other Ambulatory Visit: Payer: Self-pay | Admitting: Thoracic Surgery (Cardiothoracic Vascular Surgery)

## 2023-07-14 ENCOUNTER — Ambulatory Visit: Admitting: Internal Medicine

## 2023-07-14 VITALS — BP 120/60 | HR 90 | Temp 98.2°F | Resp 18 | Ht 70.0 in | Wt 166.5 lb

## 2023-07-14 DIAGNOSIS — N182 Chronic kidney disease, stage 2 (mild): Secondary | ICD-10-CM

## 2023-07-14 DIAGNOSIS — I4891 Unspecified atrial fibrillation: Secondary | ICD-10-CM

## 2023-07-14 DIAGNOSIS — I10 Essential (primary) hypertension: Secondary | ICD-10-CM | POA: Diagnosis not present

## 2023-07-14 DIAGNOSIS — I7121 Aneurysm of the ascending aorta, without rupture: Secondary | ICD-10-CM

## 2023-07-14 DIAGNOSIS — M5441 Lumbago with sciatica, right side: Secondary | ICD-10-CM | POA: Diagnosis not present

## 2023-07-14 DIAGNOSIS — G8929 Other chronic pain: Secondary | ICD-10-CM | POA: Diagnosis not present

## 2023-07-14 DIAGNOSIS — I719 Aortic aneurysm of unspecified site, without rupture: Secondary | ICD-10-CM | POA: Diagnosis not present

## 2023-07-14 MED ORDER — TRAMADOL HCL 50 MG PO TABS: 100.0000 mg | ORAL_TABLET | Freq: Three times a day (TID) | ORAL | 1 refills | Status: DC | PRN

## 2023-07-14 NOTE — Assessment & Plan Note (Addendum)
 He has worsening chronic back pain where neurosurgery wants to do surgery.  They want cardiac clearance and cardiology wants him to be seen by cardiothoracic surgery due to his history of aortic aneursym.  I am going to refer him to the [facility] cardiothoracic group.  He states his pain is usually moderate rated a 6 or 7 but can become 10 out 10.  There is no numbness/tingling and no weakness but he does have a right foot drop.  I reviewed his [place] CSR/PDMP and we will refill his tramadol  at this time.  Cardiology is waiting on coronary CT angiogram.

## 2023-07-14 NOTE — Assessment & Plan Note (Signed)
 He CKD has been stable.  He is no longer on meloxicam.  We will avoid NSAIDS as able.  Continue farxiga .

## 2023-07-14 NOTE — Assessment & Plan Note (Signed)
His BP is controlled.  We will continue on his current medications. 

## 2023-07-14 NOTE — Progress Notes (Signed)
 Office Visit  Subjective   Patient ID: Aaron Richmond   DOB: Jul 15, 1945   Age: 78 y.o.   MRN: [id number]   Chief Complaint Chief Complaint  Patient presents with   Follow-up    3 month     History of Present Illness  [name] returns today to discuss his worsening back pain.  He underwent lumbar fusion at L4-S1 on 02/06/2022 for spondylolisthesis of lumbar region with lumbar radiculopathy.  His back pain continued to worsen.  His daugther in-law used to work there and they called and got an appointment.  I do not have notes from neurosurgery but the patient tells me that the prior kyphoplasty slid over and it is impinging on his spinal cord.  They want to do surgery above the kyphoplasty and they are asking for clearance for surgery.  He did see cardiology on 07/01/2023 where they have ordered an ECHO and a coronary CT angiogram.  Cardiology also wants him to be seen by cardiothoracic and vascular surgery due to his history of ascending aortic aneursym where Dr. Jory felt this was enlarged.  They just found out that Dr. Fleeta Brooke has retired and they are not sure if they need a referral.  Cardiology also has referred him for an ECHO.  The patient has had a herniated disc of his lower lumbar spine.  He has seen Dr. Renato who states he has grade 1 spondylolithesis of L4-L5 with right lower extremity radiation.  He saw Dr. Renato back in 10/2019 and he did have an ESI of his lumbar spine.  This did help with his pain considerably at that time.  He had a trigger point injection around 11/2021 and this also helped with his lower back pain.  We sent him to ortho in the past due to right knee pain.  Ortho did xrays which showed arthritis of his right knee and they did a cortisone injection at that time.  Again, he had a right knee replacement 01/2018.  He did see radiology on 12/07/2021 and he tells me they did another ESI.  The patient states this did not help.  He did have a repeat MRI of his  lumbar spine on 12/17/2021 and this showed interval development of an acute/subacute L1 compression fracture with mild osseous retropulsion and was associated with a vertical fracture through the right pedicle.  There was unchanged mild to moderate spinal stenosis at L4-L5 and other changes.   He had a kyphoplasty performed  on 12/19/2021 with Dr. Renato which he states did help some with his pain.   The patient tells me that he had a recent MRI of his spine done at [facility].  I do not have these results.  The patient has a known history of  A. Fib.  He last saw cardiology on 6/24/202.  He had an ECHO done on 08/07/2021.  He had a cardiomyopathy where his ECHO showed that he had a mild to moderate decrease his LV function with a LVEF of 40-45%.  He has LV hypokinesis and a LV internal cavity size that is mildly dilated.  He has moderate concentric LVH and his left diastolic parameters were indeterminate.  His LA is severely dilated and he has moderate MR and AV.  They did do a repeat ECHO on 10/09/2021 and this showed an improvement in his EF where his EF went up to 50-55% and low normal systolic function.  There was no regional wall motion abnormalities. The left ventricular  internal cavity size was mildly to moderately dilated. His Left ventricular diastolic parameters are indeterminate.  His right ventricular systolic function is normal. The right ventricular  size is normal. There is normal pulmonary artery systolic pressure. His left atrial size was severely dilated. He mild to moderate mitral valve regurgitation.  He has an aneurysm of the ascending aorta, measuring 51 mm. The patient did followup with Cardiology on 11/09/2021 and recommended continued management and he was stable at that time. Again, the patient was admitted to [facility] from 06/03/2021 until 06/07/2021 with A. Fib with RVR.  He states that he woke up after a nap and began having severe generalized weakness where he could not use his legs.  The  patient was brought in to the ER where his wife states his HR was in the 190's and they discovered he was in A. Fib with RVR.  He has a known history of A. Fib where he is followed regularly by myself and cardiology.  While in the hospital, they did place him on a Cardizem  drip.  He did not have a repeat ECHO.  They did perform a CTA of his chest which showed no evidence of pulmonary embolism and he had a stable 4.9 AAA that was unchanged.  Again, cardiology performed a stress ECHO in Oct 2016 which was normal.  They did a ECHO in 02/2017 which showed moderate LVH with a normal LVEF 50-55% without wall motion abnormalities.  He had a repeat ECHO in 09/04/2019 and this showed an LVEF 50-55% with his LV cavity size severely dilated and his LA was severely dilated and there was mild-moderate MVR.  He had AV insuffiency that was moderate and a LV dilatation where they think he may be coming soon to the point of surgery.   Anticoagulation status: He remains on lifelong xarelto  therapy per cardiology. CHADVASC2 score is 3.  He is currently on xarelto  20mg  daily and metoprolol  75mg  BID.  He denies any bruising, melena, BRBPR or other bleeding problems.   The patient is a 78 year old Caucasian/White male who presents for a follow-up evaluation of hypertension.  Since his last visit, he states he has not had any problems.   The patient has been checking his blood pressure at home.  His systolic BP has been running 110-120's.    The patient's current medications include:  metoprolol  tartrate 75 mg BID  torsemide  20mg  BID except on M/W/F he takes 40mg  in AM and 20mg  in the evening. The patient has been tolerating his medications well. The patient denies any headache, visual changes, dizziness, lightheadness, chest pain, shortness of breath, weakness/numbness, and edema.  He reports there have been no other symptoms noted.   He does have a history of Stage II-III CKD which has been going on for the last 6-7 years.  He has  been stable and his GFR has been running in the 60's with a creatinine baseline 1.2-1.3.  I started him on farxiga  for renal protection in 2022 and he states he is not having any problems or side effects.  He is avoiding NSAIDS.        Past Medical History Past Medical History:  Diagnosis Date   Aneurysm of aortic root    Overview:  Last Assessment & Plan:  Planning to see Dr Army for evaluation of 5cm aneurysm.  - will check PFT in prep for possible procedure / SGY   Aortic atherosclerosis (HCC) 06/14/2022   Aortic regurgitation  Aortic valve insufficiency    Ascending aortic aneurysm (HCC) 09/11/2016   Atherosclerotic vascular disease 05/27/2022   Atrial fibrillation (HCC)    Atrial fibrillation with RVR (HCC) 07/05/2022   BMI 27.0-27.9,adult 06/14/2022   BPH (benign prostatic hyperplasia)    BRBPR (bright red blood per rectum) 05/20/2023   Cardiomyopathy (HCC) 09/11/2016   CKD (chronic kidney disease) stage 2, GFR 60-89 ml/min 11/23/2021   Closed fracture of right hip (HCC) 03/10/2023   Coronary artery calcification seen on CT scan 10/13/2017   Essential hypertension 06/14/2022   Gastrointestinal hemorrhage 05/20/2023   High risk medication use 07/31/2022   History of cardiomyopathy 11/09/2021   History of pulmonary embolism 09/24/2012   Overview:  Last Assessment & Plan:  Hx PE x 2 per notes, ? 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  - needs auto-titration study by [employer] or local company, data to [facility] to adjust his home device.    OSA (obstructive sleep apnea) 09/13/2022   Prediabetes    Preoperative clearance 10/13/2017   Primary osteoarthritis involving multiple joints 06/14/2022   Prostate cancer metastatic to bone (HCC) 06/14/2022   Pulmonary embolism (HCC)    Rhinitis    Sacral wound 04/11/2023   Seasonal allergies 06/14/2022   Spondylolisthesis of lumbar region 12/12/2021   Stage 3a chronic kidney disease (HCC) 06/14/2022   Unstageable pressure ulcer of sacral region (HCC) 03/10/2023     Allergies Allergies  Allergen Reactions   Ciprofloxacin Other (See Comments)    Contraindicated because of increased risk of rupture of ascending thoracic aortic aneurysm     Medications  Current Outpatient Medications:    acetaminophen  (TYLENOL ) 500 MG tablet, Take 1,000 mg by mouth in the morning and at bedtime., Disp: , Rfl:    Cyanocobalamin  (B-12) 5000 MCG CAPS, Take 1 capsule by mouth daily at 12 noon., Disp: , Rfl:    FARXIGA  5 MG TABS tablet, Take 1 tablet by mouth once daily, Disp: 90 tablet, Rfl: 0   KLOR-CON  M20 20 MEQ tablet, Take 20 mEq by mouth daily., Disp: , Rfl:    methocarbamol  (ROBAXIN ) 500 MG tablet, Take 500 mg by mouth 4 (four) times daily., Disp: , Rfl:    metoprolol  tartrate (LOPRESSOR ) 50 MG tablet, TAKE 1 & 1/2 (ONE & ONE-HALF) TABLETS BY MOUTH TWICE DAILY, Disp: 180 tablet, Rfl: 0   Multiple Vitamin (MULTIVITAMIN WITH MINERALS) TABS tablet, Take 1 tablet by mouth daily., Disp: , Rfl:    polyethylene glycol (MIRALAX  / GLYCOLAX ) 17 g packet, Take 17 g by mouth daily as needed for mild constipation., Disp: , Rfl:    rivaroxaban  (XARELTO ) 20 MG TABS tablet, Take 1 tablet (20 mg total) by mouth daily with supper., Disp: , Rfl:    rosuvastatin  (CRESTOR ) 5 MG tablet, Take 1 tablet by mouth once daily, Disp: 90 tablet, Rfl: 2   tamsulosin  (FLOMAX ) 0.4 MG CAPS  capsule, TAKE 1 CAPSULE BY MOUTH TWICE  DAILY 30 MINUTES FOLLOWING  THE  SAME  MEAL  EACH  DAY, Disp: 180 capsule, Rfl: 0   torsemide  (DEMADEX ) 20 MG tablet, Take 1 tablet by mouth twice daily, Disp: 180 tablet, Rfl: 0   traMADol  (ULTRAM ) 50 MG tablet, Take 2 tablets (100 mg total) by mouth every 8 (eight) hours as needed., Disp: 90 tablet, Rfl: 1   Review of Systems Review of Systems  Constitutional:  Negative for chills and fever.  Eyes:  Negative for blurred vision and double vision.  Respiratory:  Negative for cough, hemoptysis and shortness of breath.   Cardiovascular:  Negative for chest pain, palpitations and leg swelling.  Gastrointestinal:  Negative for abdominal pain, blood in stool, constipation, diarrhea, melena, nausea and vomiting.  Genitourinary:  Negative for hematuria.  Musculoskeletal:  Negative for myalgias.  Skin:  Negative for itching and rash.  Neurological:  Negative for dizziness, weakness and headaches.       Objective:    Vitals BP 120/60   Pulse 90   Temp 98.2 F (36.8 C)   Resp 18   Ht 5' 10 (1.778 m)   Wt 166 lb 8 oz (75.5 kg)   SpO2 95%   BMI 23.89 kg/m    Physical Examination Physical Exam Constitutional:      Appearance: Normal appearance. He is not ill-appearing.  Cardiovascular:     Rate and Rhythm: Normal rate and regular rhythm.     Pulses: Normal pulses.     Heart sounds: No murmur heard.    No friction rub. No gallop.  Pulmonary:     Effort: Pulmonary effort is normal. No respiratory distress.     Breath sounds: No wheezing, rhonchi or rales.  Abdominal:     General: Abdomen is flat. Bowel sounds are normal. There is no distension.     Palpations: Abdomen is soft.     Tenderness: There is no abdominal tenderness.  Musculoskeletal:     Right lower leg: No edema.     Left lower leg: No edema.  Skin:    General: Skin is warm and dry.     Findings: No rash.  Neurological:     General: No focal deficit present.     Mental  Status: He is alert and oriented to person, place, and time.  Psychiatric:        Mood and Affect: Mood normal.        Behavior: Behavior normal.        Assessment & Plan:   Chronic right-sided low back pain with right-sided sciatica He has worsening chronic back pain where neurosurgery wants to do surgery.  They want cardiac clearance and cardiology wants him to be seen by cardiothoracic surgery due to his history of aortic aneursym.  I am going to refer him to the [facility] cardiothoracic group.  He states his pain is usually moderate rated a 6 or 7 but can become 10 out 10.  There is no numbness/tingling and no weakness but he does have a right foot drop.  I reviewed his [place] CSR/PDMP and we will refill his tramadol  at this time.  Cardiology is waiting on coronary CT angiogram.  Atrial fibrillation He will continue with rate control with metoprolol  and he is on xarelto  for anticoagulation.  Essential hypertension His BP is controlled.  We will continue on his current medications.  CKD (chronic kidney disease) stage 2, GFR 60-89 ml/min He CKD has been stable.  He is no longer on meloxicam.  We will avoid NSAIDS as able.  Continue farxiga .    Return in about 3 months (around 10/14/2023).   Selinda Fleeta Finger, MD

## 2023-07-14 NOTE — Assessment & Plan Note (Signed)
 He will continue with rate control with metoprolol  and he is on xarelto  for anticoagulation.

## 2023-07-15 ENCOUNTER — Telehealth (HOSPITAL_COMMUNITY): Payer: Self-pay | Admitting: Emergency Medicine

## 2023-07-15 NOTE — Telephone Encounter (Signed)
 Reaching out to patient to offer assistance regarding upcoming cardiac imaging study; pt verbalizes understanding of appt date/time, parking situation and where to check in, pre-test NPO status and medications ordered, and verified current allergies; name and call back number provided for further questions should they arise Rockwell Alexandria RN Navigator Cardiac Imaging [facility] [phone] office [phone] cell

## 2023-07-16 ENCOUNTER — Ambulatory Visit (HOSPITAL_COMMUNITY)
Admission: RE | Admit: 2023-07-16 | Discharge: 2023-07-16 | Disposition: A | Discharge status: Home / Self Care | Source: Ambulatory Visit | Attending: Cardiology | Admitting: Cardiology

## 2023-07-16 ENCOUNTER — Telehealth: Payer: Self-pay

## 2023-07-16 DIAGNOSIS — I7121 Aneurysm of the ascending aorta, without rupture: Secondary | ICD-10-CM | POA: Diagnosis not present

## 2023-07-16 DIAGNOSIS — I429 Cardiomyopathy, unspecified: Secondary | ICD-10-CM | POA: Diagnosis not present

## 2023-07-16 DIAGNOSIS — I2510 Atherosclerotic heart disease of native coronary artery without angina pectoris: Secondary | ICD-10-CM | POA: Diagnosis not present

## 2023-07-16 DIAGNOSIS — I2584 Coronary atherosclerosis due to calcified coronary lesion: Secondary | ICD-10-CM | POA: Diagnosis not present

## 2023-07-16 DIAGNOSIS — Z01810 Encounter for preprocedural cardiovascular examination: Secondary | ICD-10-CM | POA: Insufficient documentation

## 2023-07-16 DIAGNOSIS — I700 Atherosclerosis of aorta: Secondary | ICD-10-CM | POA: Diagnosis not present

## 2023-07-16 MED ORDER — IOHEXOL 350 MG/ML SOLN
100.0000 mL | Freq: Once | INTRAVENOUS | Status: AC | PRN
  Administered 2023-07-16: 100 mL via INTRAVENOUS

## 2023-07-16 MED ORDER — NITROGLYCERIN 0.4 MG SL SUBL
0.8000 mg | SUBLINGUAL_TABLET | Freq: Once | SUBLINGUAL | Status: AC
  Administered 2023-07-16: 0.8 mg via SUBLINGUAL

## 2023-07-16 NOTE — Telephone Encounter (Signed)
   Pre-operative Risk Assessment    Patient Name: Aaron Richmond  DOB: 1945-09-28 MRN: [id number]   Date of last office visit: 07/01/23 JENNIFER CRAPE, MD Date of next office visit: 08/11/23 CAMPBELL LEITER, MD   Request for Surgical Clearance    Procedure:  THORACIC FUSION  Date of Surgery:  Clearance TBD                                Surgeon:  DR ARLEY HELLING Surgeon's Group or Practice Name:  [facility] Phone number:  [phone]  EXT 8221 Fax number:  [phone]  OR  [phone]   ATTN: NIKKI   Type of Clearance Requested:   - Medical  - Pharmacy:  Hold Rivaroxaban  (Xarelto )     Type of Anesthesia:  General    Additional requests/questions:    SignedLucie DELENA Ku   07/16/2023, 3:40 PM

## 2023-07-17 ENCOUNTER — Other Ambulatory Visit: Payer: Self-pay | Admitting: Cardiology

## 2023-07-17 ENCOUNTER — Ambulatory Visit (HOSPITAL_COMMUNITY)
Admission: RE | Admit: 2023-07-17 | Discharge: 2023-07-17 | Disposition: A | Discharge status: Home / Self Care | Source: Ambulatory Visit | Attending: Cardiology | Admitting: Cardiology

## 2023-07-17 DIAGNOSIS — R931 Abnormal findings on diagnostic imaging of heart and coronary circulation: Secondary | ICD-10-CM | POA: Insufficient documentation

## 2023-07-17 DIAGNOSIS — I2510 Atherosclerotic heart disease of native coronary artery without angina pectoris: Secondary | ICD-10-CM | POA: Insufficient documentation

## 2023-07-17 NOTE — Telephone Encounter (Signed)
 Received 2nd Preop clearance request from [facility] the following has been updated:  Procedure: T10-11, T11-12, T12-L1, L1-2, L2-3, L3-4 POSTERIOR LATERAL FUSION

## 2023-07-18 ENCOUNTER — Telehealth: Payer: Self-pay | Admitting: Primary Care

## 2023-07-18 NOTE — Telephone Encounter (Signed)
 Dr. Edwyna to review, recent coronary CTA showed moderate CAD, FFR was negative for significant lesion.  Coronary CTA also showed a 5.3 cm thoracic aortic aneurysm.  Echocardiogram shows EF remain low at 35 to 40%.  With the result of the recent coronary CTA, can the patient be cleared for the upcoming procedure?  Please forward the response to P CV DIV PREOP  Will also forward to a clinical pharmacist to review Xarelto .

## 2023-07-18 NOTE — Telephone Encounter (Signed)
 Fax received from Dr. Arley Helling with [facility] to perform a thoracic fusion under general anesthesia on patient.  Patient needs surgery clearance. Surgery is pending. Patient was seen on 08/01/22. Office protocol is a risk assessment can be sent to surgeon if patient has been seen in 60 days or less.   Pt will need appt here in order to give risk assessment.   I called the pt and spoke with him and scheduled him to see Beth on 08/13/23 at 1:30 pm  Routing to clearance pool for now

## 2023-07-21 NOTE — Telephone Encounter (Signed)
 Patient with diagnosis of A Fib on Xarelto  for anticoagulation.    Procedure: THORACIC FUSION  Date of procedure: TBD   CHA2DS2-VASc Score = 5  This indicates a 7.2% annual risk of stroke. The patient's score is based upon: CHF History: 1 HTN History: 1 Diabetes History: 0 Stroke History: 0 Vascular Disease History: 1 Age Score: 2 Gender Score: 0    CrCl 68 ml/min Platelet count 176K   Per office protocol, patient can hold Xarelto  for 3 days prior to procedure.    **This guidance is not considered finalized until pre-operative APP has relayed final recommendations.**

## 2023-07-21 NOTE — Telephone Encounter (Signed)
 Helping in preop today. As below, Scot Ford routed inquiry to Dr. Edwyna, awaiting reply.

## 2023-07-24 ENCOUNTER — Ambulatory Visit (HOSPITAL_COMMUNITY)
Admission: RE | Admit: 2023-07-24 | Discharge: 2023-07-24 | Disposition: A | Discharge status: Home / Self Care | Source: Ambulatory Visit | Attending: Thoracic Surgery (Cardiothoracic Vascular Surgery) | Admitting: Thoracic Surgery (Cardiothoracic Vascular Surgery)

## 2023-07-24 DIAGNOSIS — I517 Cardiomegaly: Secondary | ICD-10-CM | POA: Insufficient documentation

## 2023-07-24 DIAGNOSIS — I2510 Atherosclerotic heart disease of native coronary artery without angina pectoris: Secondary | ICD-10-CM | POA: Insufficient documentation

## 2023-07-24 DIAGNOSIS — I700 Atherosclerosis of aorta: Secondary | ICD-10-CM | POA: Insufficient documentation

## 2023-07-24 DIAGNOSIS — R918 Other nonspecific abnormal finding of lung field: Secondary | ICD-10-CM | POA: Diagnosis not present

## 2023-07-24 DIAGNOSIS — I7121 Aneurysm of the ascending aorta, without rupture: Secondary | ICD-10-CM | POA: Insufficient documentation

## 2023-07-24 DIAGNOSIS — I712 Thoracic aortic aneurysm, without rupture, unspecified: Secondary | ICD-10-CM | POA: Diagnosis not present

## 2023-07-24 MED ORDER — IOHEXOL 350 MG/ML SOLN
100.0000 mL | Freq: Once | INTRAVENOUS | Status: AC | PRN
  Administered 2023-07-24: 100 mL via INTRAVENOUS

## 2023-07-24 NOTE — Telephone Encounter (Signed)
-----   Message from Jennifer SAUNDERS Revankar sent at 07/23/2023 11:27 AM EDT ----- Nonobstructive disease.  Please bring patient in for Chem-7 liver lipid check.  Copy primary.  Secondary prevention.  Diet and excise. Jennifer SAUNDERS Crape, MD 07/23/2023 11:27 AM  ----- Message ----- From: Interface, Rad Results In Sent: 07/17/2023   2:00 PM EDT To: Jennifer SAUNDERS Crape, MD

## 2023-07-24 NOTE — Telephone Encounter (Signed)
 Results reviewed with [name] per DPR as per Dr. Posey note. [name] verbalized understanding and had no additional questions. Routed to PCP.

## 2023-07-26 ENCOUNTER — Other Ambulatory Visit: Payer: Self-pay | Admitting: Internal Medicine

## 2023-07-27 DIAGNOSIS — G4733 Obstructive sleep apnea (adult) (pediatric): Secondary | ICD-10-CM | POA: Diagnosis not present

## 2023-07-29 NOTE — Telephone Encounter (Signed)
 Patient has an upcoming appointment on 8/4 clearance can be addressed at office visit will make requesting office aware

## 2023-07-29 NOTE — Telephone Encounter (Signed)
 Patient has upcoming appointment on 8/4 with Dr. Monetta. Would recommend patient await follow-up appointment prior to scheduling thoracic fusion. Please notify requesting party and notify pt to discuss clearance with Dr. Monetta. Appointment notes have been updated.   Rosaline EMERSON Bane, NP-C  07/29/2023, 3:53 PM [address] [place], [place] [address] Office [phone] Fax [phone]

## 2023-07-30 ENCOUNTER — Other Ambulatory Visit: Payer: Self-pay | Admitting: Internal Medicine

## 2023-08-04 DIAGNOSIS — I2510 Atherosclerotic heart disease of native coronary artery without angina pectoris: Secondary | ICD-10-CM | POA: Diagnosis not present

## 2023-08-04 DIAGNOSIS — E782 Mixed hyperlipidemia: Secondary | ICD-10-CM | POA: Diagnosis not present

## 2023-08-05 LAB — COMPREHENSIVE METABOLIC PANEL WITH GFR
ALT: 12 IU/L (ref 0–44)
AST: 16 IU/L (ref 0–40)
Albumin: 4.1 g/dL (ref 3.8–4.8)
Alkaline Phosphatase: 94 IU/L (ref 44–121)
BUN/Creatinine Ratio: 14 (ref 10–24)
BUN: 18 mg/dL (ref 8–27)
Bilirubin Total: 0.4 mg/dL (ref 0.0–1.2)
CO2: 26 mmol/L (ref 20–29)
Calcium: 9.2 mg/dL (ref 8.6–10.2)
Chloride: 99 mmol/L (ref 96–106)
Creatinine, Ser: 1.27 mg/dL (ref 0.76–1.27)
Globulin, Total: 2.3 g/dL (ref 1.5–4.5)
Glucose: 99 mg/dL (ref 70–99)
Potassium: 3.9 mmol/L (ref 3.5–5.2)
Sodium: 142 mmol/L (ref 134–144)
Total Protein: 6.4 g/dL (ref 6.0–8.5)
eGFR: 58 mL/min/1.73 — ABNORMAL LOW (ref >59)

## 2023-08-05 LAB — LIPID PANEL
Chol/HDL Ratio: 3.2 ratio (ref 0.0–5.0)
Cholesterol, Total: 126 mg/dL (ref 100–199)
HDL: 39 mg/dL — ABNORMAL LOW (ref >39)
LDL Chol Calc (NIH): 65 mg/dL (ref 0–99)
Triglycerides: 120 mg/dL (ref 0–149)
VLDL Cholesterol Cal: 22 mg/dL (ref 5–40)

## 2023-08-09 NOTE — Progress Notes (Unsigned)
 Cardiology Office Note:    Date:  08/11/2023   ID:  Aaron Richmond, DOB [birth year], MRN [id number]  PCP:  Fleeta Valeria Mayo, MD  Cardiologist:  Redell Leiter, MD    Referring MD: Fleeta Valeria Mayo, MD    ASSESSMENT:    1. Preoperative cardiovascular examination   2. Permanent atrial fibrillation (HCC)   3. Chronic anticoagulation   4. Hypertensive heart disease with heart failure (HCC)   5. Nonrheumatic aortic valve insufficiency   6. Nonrheumatic mitral valve regurgitation   7. Aneurysm of ascending aorta without rupture (HCC)   8. Coronary artery disease of native artery of native heart with stable angina pectoris (HCC)   9. Mixed hyperlipidemia   10. Preop cardiovascular exam    PLAN:    In order of problems listed above:  Although he has multiple cardiovascular problems his atrial fibrillation heart failure CAD LV dysfunction and thoracic aortic aneurysm are stable the patient is that he is optimal and I advise proceeding with his planned neurosurgery with postoperative follow-up in a monitored bed day 1 day 2 and a consult to heart care while in hospital Rate controlled his wife understands he will need to hold his anticoagulant 3 full doses prior to surgery may be off for up to 1 to 2 weeks from a bleeding perspective to be determined by neurosurgery Heart failure is compensated ejection fraction is stable continue his current cardiac medications and will resume low-dose Entresto  that was stopped when he was in a rehab center Valvular heart disease is stable Stable thoracic aortic aneurysm Stable CAD not having anginal discomfort   Next appointment: 6 months   Medication Adjustments/Labs and Tests Ordered: Current medicines are reviewed at length with the patient today.  Concerns regarding medicines are outlined above.  Orders Placed This Encounter  Procedures   EKG 12-Lead   Meds ordered this encounter  Medications   sacubitril -valsartan  (ENTRESTO ) 24-26 MG    Sig:  Take 1 tablet by mouth 2 (two) times daily.    Dispense:  180 tablet    Refill:  3     History of Present Illness:    Aaron Richmond is a 78 y.o. male with a hx of known atrial fibrillation with rate control and long-term anticoagulation with Eliquis hypertensive heart disease with heart failure aortic insufficiency and aneurysm of the ascending aorta last seen by me 03/17/2023.  He was seen by my partner Dr. Edwyna in preoperative evaluation 07/01/2023  He had a cardiac CTA reported 07/16/2023 please note the clinical data relates this is a male I will be sure tomorrow that the patient indeed had the study performed that day.  The results showed a very high coronary calcium  score of 1959, plaque volume 94th percentile there is fusiform enlargement and aneurysm of the ascending thoracic aorta up to 5.3 cm.  CAD was present involving mid left anterior descending coronary artery 25 to 49% first diagonal branch 50 to 69% and diffuse calcification noted left circumflex and right coronary artery without focal stenosis.  He also had an echocardiogram reported 07/04/2023 showing LV dysfunction with an EF 35 to 40% global hypokinesia severe left and moderate right atrial enlargement mild to moderate mitral regurgitation and aortic regurgitation that was described as moderate with a half-time of 467 ms.  His previous June 2024 EF 40 to 45% moderate aortic regurgitation.  Compliance with diet, lifestyle and medications: Yes  Is a very complex visit I was unaware that he is pending spine  surgery I reviewed his testing including the CTA indeed with Sam although the use the pronoun she rather than he in the report he thought that he had no coronary artery disease I told him he has mild does not need coronary angiography or revascularization percutaneously I reviewed that his ascending aortic enlargement aneurysm is stable His heart failure is compensated previously was on Entresto  and it was stopped when  he was in a rehab center and [place] resume Overall he is really doing quite well he is limited by back pain but he is not having angina edema shortness of breath chest pain palpitation or syncope He is tolerating his lipid-lowering therapy without muscle pain or weakness He has had no bleeding complication from his anticoagulant and understands he needs to stop at 3 full doses prior to back surgery and will be restarted when safe from bleeding perspective by his neurosurgeon  Their question is whether he is optimized for his planned surgical procedure His atrial fibrillation is rate controlled and we can safely withdraw his anticoagulant without bridging perioperatively Ejection fraction is stable although reduced his heart failure is compensated.  To optimize treatment he will resume low-dose Entresto  CAD and aortic aneurysm are stable he is seeing CT surgery tomorrow  In my opinion he is optimized and appropriate for the planned surgical procedure he certainly has a complex manage he should be in a monitored bed postoperative day 1 and 2 EKG both days and I would think he be best served by seeing heart care while at [facility] in consultation  Patient and wife agree Past Medical History:  Diagnosis Date   Aneurysm of aortic root    Overview:  Last Assessment & Plan:  Planning to see Dr Army for evaluation of 5cm aneurysm.  - will check PFT in prep for possible procedure / SGY   Aortic atherosclerosis (HCC) 06/14/2022   Aortic regurgitation    Aortic valve insufficiency    Ascending aortic aneurysm (HCC) 09/11/2016   Atherosclerotic vascular disease 05/27/2022   Atrial fibrillation (HCC)    Atrial fibrillation with RVR (HCC) 07/05/2022   BMI 27.0-27.9,adult 06/14/2022   BPH (benign prostatic hyperplasia)    BRBPR (bright red blood per rectum) 05/20/2023   Cardiomyopathy (HCC) 09/11/2016   CKD (chronic kidney disease) stage 2, GFR 60-89 ml/min 11/23/2021   Closed fracture of right  hip (HCC) 03/10/2023   Coronary artery calcification seen on CT scan 10/13/2017   Essential hypertension 06/14/2022   Gastrointestinal hemorrhage 05/20/2023   High risk medication use 07/31/2022   History of cardiomyopathy 11/09/2021   History of pulmonary embolism 09/24/2012   Overview:  Last Assessment & Plan:  Hx PE x 2 per notes, ? 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  - needs auto-titration study by [employer] or local company, data to RB to adjust his home device.    OSA (obstructive sleep apnea) 09/13/2022   Prediabetes  Preoperative clearance 10/13/2017   Primary osteoarthritis involving multiple joints 06/14/2022   Prostate cancer metastatic to bone (HCC) 06/14/2022   Pulmonary embolism (HCC)    Rhinitis    Sacral wound 04/11/2023   Seasonal allergies 06/14/2022   Spondylolisthesis of lumbar region 12/12/2021   Stage 3a chronic kidney disease (HCC) 06/14/2022   Unstageable pressure ulcer of sacral region (HCC) 03/10/2023    Current Medications: Current Meds  Medication Sig   sacubitril -valsartan  (ENTRESTO ) 24-26 MG Take 1 tablet by mouth 2 (two) times daily.      EKGs/Labs/Other Studies Reviewed:    The following studies were reviewed today:  Cardiac Studies & Procedures   ______________________________________________________________________________________________   STRESS TESTS  MYOCARDIAL  PERFUSION IMAGING 10/27/2017  Interpretation Summary  Nuclear stress EF: 49%.  There was no ST segment deviation noted during stress.  This is a low risk study.  The left ventricular ejection fraction is mildly decreased (45-54%).  Low risk stress nuclear study with normal perfusion mildly dilated left ventricle with mildly reduced global systolic function. Findings suggest nonischemic cardiomyopathy.   ECHOCARDIOGRAM  ECHOCARDIOGRAM COMPLETE 07/04/2023  Narrative ECHOCARDIOGRAM REPORT    Patient Name:   Aaron Richmond Date of Exam: 07/04/2023 Medical Rec #:  [id number]        Height:       70.0 in Accession #:    [id number]       Weight:       170.0 lb Date of Birth:  1945/03/26        BSA:          1.948 m Patient Age:    77 years         BP:           116/50 mmHg Patient Gender: M                HR:           80 bpm. Exam Location:  [place]  Procedure: 2D Echo, Cardiac Doppler and Color Doppler (Both Spectral and Color Flow Doppler were utilized during procedure).  Indications:    Preop cardiovascular exam Z01.810; Aneurysm of ascending aorta without rupture I71.21; Aortic atherosclerosis I70.0; ; I35.1 Nonrheumatic aortic (valve) insufficiency; I42.9 Cardiomyopathy (unspecified)  History:        Patient has prior history of Echocardiogram examinations, most recent 10/30/2023. Cardiomyopathy, Arrythmias:Atrial Fibrillation; Risk Factors:Hypertension.  Sonographer:    Alm Minerva Referring Phys: CYRUS JENNIFER SAUNDERS [facility]  IMPRESSIONS   1. Left ventricular ejection fraction, by estimation, is 35 to 40%. Left ventricular ejection fraction by 2D MOD biplane is 36.4 %. The left ventricle has moderately decreased function. The left ventricle demonstrates global hypokinesis. The left ventricular internal cavity size was mildly to moderately dilated. Left ventricular diastolic parameters are indeterminate. 2. Right ventricular systolic function is normal. The right  ventricular size is normal. 3. Left atrial size was severely dilated. 4. Right atrial size was mild to moderately dilated. 5. The mitral valve is normal in structure. Mild to moderate mitral valve regurgitation. 6. The aortic valve is tricuspid. Aortic valve regurgitation is moderate. 7. Aortic dilatation noted. There is severe dilatation of the ascending aorta, measuring 56 mm.  FINDINGS Left Ventricle: Left ventricular ejection fraction, by estimation, is 35 to 40%. Left ventricular ejection fraction by 2D MOD biplane is 36.4 %. The left ventricle has moderately decreased function. The left ventricle demonstrates global hypokinesis. The left ventricular internal cavity size was mildly to moderately dilated. There is no  left ventricular hypertrophy. Left ventricular diastolic parameters are indeterminate.  Right Ventricle: The right ventricular size is normal. No increase in right ventricular wall thickness. Right ventricular systolic function is normal.  Left Atrium: Left atrial size was severely dilated.  Right Atrium: Right atrial size was mild to moderately dilated.  Pericardium: There is no evidence of pericardial effusion.  Mitral Valve: The mitral valve is normal in structure. Mild to moderate mitral valve regurgitation.  Tricuspid Valve: The tricuspid valve is normal in structure. Tricuspid valve regurgitation is mild.  Aortic Valve: The aortic valve is tricuspid. Aortic valve regurgitation is moderate. Aortic regurgitation PHT measures 467 msec. Aortic valve mean gradient measures 7.0 mmHg. Aortic valve peak gradient measures 11.3 mmHg. Aortic valve area, by VTI measures 2.53 cm.  Pulmonic Valve: The pulmonic valve was normal in structure. Pulmonic valve regurgitation is trivial.  Aorta: Aortic dilatation noted. There is severe dilatation of the ascending aorta, measuring 56 mm.  Venous: The inferior vena cava was not well visualized.  IAS/Shunts: No atrial level shunt  detected by color flow Doppler.   LEFT VENTRICLE PLAX 2D                        Biplane EF (MOD) LVIDd:         6.30 cm         LV Biplane EF:   Left LVIDs:         5.90 cm                          ventricular LV PW:         1.20 cm                          ejection LV IVS:        1.00 cm                          fraction by LVOT diam:     2.50 cm                          2D MOD LV SV:         75                               biplane is LV SV Index:   38                               36.4 %. LVOT Area:     4.91 cm  LV Volumes (MOD) LV vol d, MOD    134.0 ml A2C: LV vol d, MOD    185.5 ml A4C: LV vol s, MOD    97.1 ml A2C: LV vol s, MOD    105.0 ml A4C: LV SV MOD A2C:   36.9 ml LV SV MOD A4C:   185.5 ml LV SV MOD BP:    59.0 ml  RIGHT VENTRICLE          IVC RV Basal diam:  4.00 cm  IVC diam: 5.10 cm RV Mid diam:    2.30 cm  LEFT ATRIUM            Index  RIGHT ATRIUM           Index LA Vol (A4C): 217.0 ml 111.40 ml/m  RA Area:     35.00 cm RA Volume:   140.00 ml 71.87 ml/m AORTIC VALVE AV Area (Vmax):    2.04 cm AV Area (Vmean):   1.93 cm AV Area (VTI):     2.53 cm AV Vmax:           168.00 cm/s AV Vmean:          120.000 cm/s AV VTI:            0.295 m AV Peak Grad:      11.3 mmHg AV Mean Grad:      7.0 mmHg LVOT Vmax:         69.70 cm/s LVOT Vmean:        47.200 cm/s LVOT VTI:          0.152 m LVOT/AV VTI ratio: 0.52 AI PHT:            467 msec  MV E velocity: 86.60 cm/s  TRICUSPID VALVE TR Peak grad:   28.7 mmHg TR Vmax:        268.00 cm/s  SHUNTS Systemic VTI:  0.15 m Systemic Diam: 2.50 cm  Redell Cave MD Electronically signed by Redell Cave MD Signature Date/Time: 07/04/2023/5:21:48 PM    Final      CT SCANS  CT CORONARY MORPH W/CTA COR W/SCORE 07/16/2023  Addendum 07/17/2023  3:44 PM ADDENDUM REPORT: 07/17/2023 15:42  CLINICAL DATA:  This is a 78 year old male with anginal symptoms.  EXAM: Cardiac/Coronary   CTA  TECHNIQUE: The patient was scanned on a [employer].  FINDINGS: A 100 kV prospective scan was triggered in the descending thoracic aorta at 111 HU's. Axial non-contrast 3 mm slices were carried out through the heart. The data set was analyzed on a dedicated work station and scored using the Agatson method. Gantry rotation speed was 250 msecs and collimation was .6 mm. No beta blockade and 0.8 mg of sl NTG was given. The 3D data set was reconstructed in 5% intervals of the 67-82 % of the R-R cycle. Diastolic phases were analyzed on a dedicated work station using MPR, MIP and VRT modes. The patient received 80 cc of contrast.  Aorta: The proximal ascending aorta is anuerysm (52 mm) in size. No calcifications. No dissection.  Aortic Valve:  Trileaflet.  No calcifications.  Coronary Arteries:  Normal coronary origin. Right Dominance.  RCA is a large dominant artery that gives rise to PDA and PLA. The RCA is a torturous artery. In the Proximal RCA there is a mild focal calcification. Mid RCA in a bend there appears to be a mild soft plaque with minimal diffuse calcifications. Distal RCA with two separate minimal mixed plaques.  Left main is a large artery that gives rise to LAD and LCX arteries. Minimal calcification in the mid left main vessel.  LAD is a large vessel. There is a focal mild calcified plaque in the ostial LAD. There is diffuse minimal (<24%) calcified plaque in the proximal LAD. The mid LAD with mild (25-49%) diffuse calcifications right below is a mid soft plaque. The distal LAD with minimal focal plaques.  First diagonal branch is a medium sized vessel with moderate (50-69%) soft plaque in the proximal portion of the vessel.  LCX is a non-dominant artery that gives rise to one large OM1 branch. Diffuse minimal calcifications throughout the LCX artery.  First obtuse marginal with mild calcified plaque in the proximal portion of the  vessel.  Coronary Calcium  Score:  Left main: 29.3  Left anterior descending artery: 918  Left circumflex artery: 391  Right coronary artery: 621  Total: 1959  Percentile: 88  Other findings:  Normal pulmonary vein drainage into the left atrium.  Normal left atrial appendage without a thrombus.  Normal size of the pulmonary artery.  IMPRESSION: 1. Coronary calcium  score of 1959. This was 38 percentile for age and sex matched control.  2. Normal coronary origin with right dominance.  3. CAD-RADS 3. Moderate stenosis. Consider symptom-guided anti-ischemic pharmacotherapy as well as risk factor modification per guideline directed care. Additional analysis with CT FFR .  4. Total plaque volume 2015 mm3 which is 94th percentile for age- and, sex-matched controls (calcified plaque 441 mm3; non-calcified plaque 1574 mm3, Low attenuation 8 mm3). TPV is extensive.  The noncardiac portion of this study will be interpreted in separate report by the radiologist.   Electronically Signed By: Kardie  Tobb D.O. On: 07/17/2023 15:42  Addendum 07/17/2023  2:08 PM ADDENDUM REPORT: 07/17/2023 14:05  EXAM: OVER-READ INTERPRETATION  CT CHEST  The following report is an over-read performed by radiologist Dr. Ester Imam [facility] on 07/17/2023. This over-read does not include interpretation of cardiac or coronary anatomy or pathology. The coronary CTA interpretation by the cardiologist is attached.  COMPARISON:  02/20/2023  FINDINGS: Cardiovascular: Similar appearing biatrial cardiomegaly. Scattered three-vessel coronary atherosclerotic calcifications. Similar appearing fusiform ascending thoracic aortic aneurysm measuring up to 53 mm. Scattered atherosclerotic calcifications about the visualized thoracic aorta.  Mediastinum/Nodes: Visualized esophagus and trachea within normal limits. No mediastinal lymphadenopathy.  Lungs/Pleura: The lungs are clear  bilaterally.  Upper Abdomen: The visualized upper abdomen is within normal limits.  Musculoskeletal: No acute osseous abnormality.  IMPRESSION: 1. Similar appearing fusiform ascending thoracic aortic aneurysm measuring up to 5.3 cm. Ascending thoracic aortic aneurysm. Recommend semi-annual imaging followup by CTA or MRA and referral to cardiothoracic surgery if not already obtained. This recommendation follows 2010 ACCF/AHA/AATS/ACR/ASA/SCA/SCAI/SIR/STS/SVM Guidelines for the Diagnosis and Management of Patients With Thoracic Aortic Disease. Circulation. 2010; 121: Z733-z630. Aortic aneurysm NOS (ICD10-I71.9) 2. Coronary and aortic atherosclerosis (ICD10-I70.0).  Ester Sides, MD  [facility]  [facility]   Electronically Signed By: Ester Sides M.D. On: 07/17/2023 14:05  Narrative CLINICAL DATA:  This is a 78 year old male with anginal symptoms.  EXAM: Cardiac/Coronary  CTA  TECHNIQUE: The patient was scanned on a [employer].  FINDINGS: A 100 kV prospective scan was triggered in the descending thoracic aorta at 111 HU's. Axial non-contrast 3 mm slices were carried out through the heart. The data set was analyzed on a dedicated work station and scored using the Agatson method. Gantry rotation speed was 250 msecs and collimation was .6 mm. No beta blockade and 0.8 mg of sl NTG was given. The 3D data set was reconstructed in 5% intervals of the 67-82 % of the R-R cycle. Diastolic phases were analyzed on a dedicated work station using MPR, MIP and VRT modes. The patient received 80 cc of contrast.  Aorta:   Size.  No calcifications.  No dissection.  Aortic Valve:  Trileaflet.  No calcifications.  Coronary Arteries:  Normal coronary origin. Right Dominance.  RCA is a large dominant artery that gives rise to PDA and PLA. The RCA is a torturous artery. In the Proximal RCA there is a mild  focal calcification.  Mid RCA in a bend there appears to be a mild soft plaque with minimal diffuse calcifications. Distal RCA with two separate minimal mixed plaques.  Left main is a large artery that gives rise to LAD and LCX arteries. Minimal calcification in the mid left main vessel.  LAD is a large vessel that has no plaque. There is a focal mild calcified plaque in the ostial LAD. There is diffuse minimal (<24%) calcified plaque in the proximal LAD. The mid LAD with mild (25-49%) diffuse calcifications right below is a mid soft plaque. The distal LAD with minimal focal plaques.  First diagonal branch is a medium sized vessel with moderate (50-69%) soft plaque in the proximal portion of the vessel.  LCX is a non-dominant artery that gives rise to one large OM1 branch. Diffuse minimal calcifications throughout the LCX artery.  First obtuse marginal with mild calcified plaque in the proximal portion of the vessel.  Coronary Calcium  Score:  Left main: 29.3  Left anterior descending artery: 918  Left circumflex artery: 391  Right coronary artery: 621  Total: 1959  Percentile: 88  Other findings:  Normal pulmonary vein drainage into the left atrium.  Normal left atrial appendage without a thrombus.  Normal size of the pulmonary artery.  IMPRESSION: 1. Coronary calcium  score of 1959. This was 86 percentile for age and sex matched control.  2. Normal coronary origin with right dominance.  3. CAD-RADS 3. Moderate stenosis. Consider symptom-guided anti-ischemic pharmacotherapy as well as risk factor modification per guideline directed care. Additional analysis with CT FFR .  4. Total plaque volume 2015 mm3 which is 94th percentile for age- and, sex-matched controls (calcified plaque 441 mm3; non-calcified plaque 1574 mm3, Low attenuation 8 mm3). TPV is extensive.  The noncardiac portion of this study will be interpreted in separate report by the  radiologist.  Electronically Signed: By: Kardie  Tobb D.O. On: 07/17/2023 13:58     ______________________________________________________________________________________________     His EKG today shows atrial fibrillation controlled ventricular rate unchanged from previous EKG Interpretation Date/Time:  Monday August 11 2023 15:31:39 EDT Ventricular Rate:  80 PR Interval:    QRS Duration:  90 QT Interval:  398 QTC Calculation: 459 R Axis:   7  Text Interpretation: Atrial fibrillation Minimal voltage criteria for LVH, may be normal variant ( R in aVL ) Nonspecific ST and T wave abnormality When compared with ECG of 01-Jul-2023 16:06, No significant change was found Confirmed by Monetta Rogue ([id number]) on 08/11/2023 4:42:30 PM EKG Interpretation Date/Time:  Monday August 11 2023 15:31:39 EDT Ventricular Rate:  80 PR Interval:    QRS Duration:  90 QT Interval:  398 QTC Calculation: 459 R Axis:   7  Text Interpretation: Atrial fibrillation Minimal voltage criteria for LVH, may be normal variant ( R in aVL ) Nonspecific ST and T wave abnormality When compared with ECG of 01-Jul-2023 16:06, No significant change was found Confirmed by Monetta Rogue ([id number]) on 08/11/2023 4:42:30 PM  EKG Interpretation Date/Time:  Monday August 11 2023 15:31:39 EDT Ventricular Rate:  80 PR Interval:    QRS Duration:  90 QT Interval:  398 QTC Calculation: 459 R Axis:   7  Text Interpretation: Atrial fibrillation Minimal voltage criteria for LVH, may be normal variant ( R in aVL ) Nonspecific ST and T wave abnormality When compared with ECG of 01-Jul-2023 16:06, No significant change was found Confirmed by Monetta Rogue ([id number]) on 08/11/2023 4:42:30 PM  EKG Interpretation Date/Time:  Monday August 11 2023 15:31:39 EDT Ventricular Rate:  80 PR Interval:    QRS Duration:  90 QT Interval:  398 QTC Calculation: 459 R Axis:   7  Text Interpretation: Atrial fibrillation Minimal voltage criteria for LVH, may  be normal variant ( R in aVL ) Nonspecific ST and T wave abnormality When compared with ECG of 01-Jul-2023 16:06, No significant change was found Confirmed by Monetta Rogue ([id number]) on 08/11/2023 4:42:30 PM    Recent Labs: 11/08/2022: NT-Pro BNP 1,382 03/10/2023: Magnesium 2.3 05/23/2023: Hemoglobin 10.0; Platelets 176 08/04/2023: ALT 12; BUN 18; Creatinine, Ser 1.27; Potassium 3.9; Sodium 142  Recent Lipid Panel    Component Value Date/Time   CHOL 126 08/04/2023 1010   TRIG 120 08/04/2023 1010   HDL 39 (L) 08/04/2023 1010   CHOLHDL 3.2 08/04/2023 1010   LDLCALC 65 08/04/2023 1010    Physical Exam:    VS:  BP 120/60   Pulse 80   Ht 5' 10 (1.778 m)   Wt 168 lb (76.2 kg)   SpO2 93%   BMI 24.11 kg/m     Wt Readings from Last 3 Encounters:  08/11/23 168 lb (76.2 kg)  07/14/23 166 lb 8 oz (75.5 kg)  07/01/23 170 lb (77.1 kg)     GEN:  Well nourished, well developed in no acute distress HEENT: Normal NECK: No JVD; No carotid bruits LYMPHATICS: No lymphadenopathy CARDIAC: Irregular rate and rhythm variable first heart sound , no murmurs, rubs, gallops RESPIRATORY:  Clear to auscultation without rales, wheezing or rhonchi  ABDOMEN: Soft, non-tender, non-distended MUSCULOSKELETAL:  No edema; No deformity  SKIN: Warm and dry NEUROLOGIC:  Alert and oriented x 3 PSYCHIATRIC:  Normal affect    Signed, Rogue Monetta, MD  08/11/2023 4:43 PM    [facility]

## 2023-08-11 ENCOUNTER — Ambulatory Visit: Attending: Cardiology | Admitting: Cardiology

## 2023-08-11 ENCOUNTER — Encounter: Payer: Self-pay | Admitting: Cardiology

## 2023-08-11 VITALS — BP 120/60 | HR 80 | Ht 70.0 in | Wt 168.0 lb

## 2023-08-11 DIAGNOSIS — I25118 Atherosclerotic heart disease of native coronary artery with other forms of angina pectoris: Secondary | ICD-10-CM

## 2023-08-11 DIAGNOSIS — I351 Nonrheumatic aortic (valve) insufficiency: Secondary | ICD-10-CM

## 2023-08-11 DIAGNOSIS — E782 Mixed hyperlipidemia: Secondary | ICD-10-CM | POA: Diagnosis not present

## 2023-08-11 DIAGNOSIS — Z7901 Long term (current) use of anticoagulants: Secondary | ICD-10-CM

## 2023-08-11 DIAGNOSIS — I4821 Permanent atrial fibrillation: Secondary | ICD-10-CM

## 2023-08-11 DIAGNOSIS — Z01810 Encounter for preprocedural cardiovascular examination: Secondary | ICD-10-CM | POA: Diagnosis not present

## 2023-08-11 DIAGNOSIS — I7121 Aneurysm of the ascending aorta, without rupture: Secondary | ICD-10-CM

## 2023-08-11 DIAGNOSIS — I340 Nonrheumatic mitral (valve) insufficiency: Secondary | ICD-10-CM

## 2023-08-11 DIAGNOSIS — I110 Hypertensive heart disease with heart failure: Secondary | ICD-10-CM

## 2023-08-11 MED ORDER — SACUBITRIL-VALSARTAN 24-26 MG PO TABS: 1.0000 | ORAL_TABLET | Freq: Two times a day (BID) | ORAL | 3 refills | Status: DC

## 2023-08-11 NOTE — Patient Instructions (Signed)
 Medication Instructions:  Your physician has recommended you make the following change in your medication:   START: Entresto  24 - 26 mg  1 tablet two times daily  *If you need a refill on your cardiac medications before your next appointment, please call your pharmacy*  Lab Work: None If you have labs (blood work) drawn today and your tests are completely normal, you will receive your results only by: MyChart Message (if you have MyChart) OR A paper copy in the mail If you have any lab test that is abnormal or we need to change your treatment, we will call you to review the results.  Testing/Procedures: None  Follow-Up: At [facility], you and your health needs are our priority.  As part of our continuing mission to provide you with exceptional heart care, our providers are all part of one team.  This team includes your primary Cardiologist (physician) and Advanced Practice Providers or APPs (Physician Assistants and Nurse Practitioners) who all work together to provide you with the care you need, when you need it.  Your next appointment:   3 month(s)  Provider:   Redell Leiter, MD    We recommend signing up for the patient portal called MyChart.  Sign up information is provided on this After Visit Summary.  MyChart is used to connect with patients for Virtual Visits (Telemedicine).  Patients are able to view lab/test results, encounter notes, upcoming appointments, etc.  Non-urgent messages can be sent to your provider as well.   To learn more about what you can do with MyChart, go to [email or web address].   Other Instructions None

## 2023-08-12 ENCOUNTER — Encounter: Payer: Self-pay | Admitting: Thoracic Surgery (Cardiothoracic Vascular Surgery)

## 2023-08-12 ENCOUNTER — Ambulatory Visit
Attending: Thoracic Surgery (Cardiothoracic Vascular Surgery) | Admitting: Thoracic Surgery (Cardiothoracic Vascular Surgery)

## 2023-08-12 VITALS — BP 97/53 | HR 73 | Resp 20 | Ht 70.0 in | Wt 168.0 lb

## 2023-08-12 DIAGNOSIS — I7121 Aneurysm of the ascending aorta, without rupture: Secondary | ICD-10-CM | POA: Diagnosis not present

## 2023-08-12 NOTE — Progress Notes (Signed)
 [address] [place] [address]             [phone]     HPI: [name] returns for follow-up of his aortic root and ascending aneurysm.  Aaron Richmond is a 78 year old man with a known aortic root and ascending aortic aneurysm, moderate aortic insufficiency, moderate mitral insufficiency, atrial fibrillation, hypertension, cardiomyopathy, hyperlipidemia, coronary calcification, prostate cancer, pulmonary embolism, stage II chronic kidney disease, lumbar radiculopathy, right foot drop, and chronic pain.  Previously followed by Dr. Army and Dr. Obadiah in our office.  He was last seen in our office by Dr. Obadiah in May 2024.  He is having issues with severe back pain and lumbar radiculopathy and right foot drop.  He saw Dr. Monetta yesterday who gave him cardiac clearance for his neurosurgery.  Denies chest pain, pressure, or tightness.  Past Medical History:  Diagnosis Date   Aneurysm of aortic root    Overview:  Last Assessment & Plan:  Planning to see Dr Aaron Richmond for evaluation of 5cm aneurysm.  - will check PFT in prep for possible procedure / SGY   Aortic atherosclerosis (HCC) 06/14/2022   Aortic regurgitation    Aortic valve insufficiency    Ascending aortic aneurysm (HCC) 09/11/2016   Atherosclerotic vascular disease 05/27/2022   Atrial fibrillation (HCC)    Atrial fibrillation with RVR (HCC) 07/05/2022   BMI 27.0-27.9,adult 06/14/2022   BPH (benign prostatic hyperplasia)    BRBPR (bright red blood per rectum) 05/20/2023   Cardiomyopathy (HCC) 09/11/2016   CKD (chronic kidney disease) stage 2, GFR 60-89 ml/min 11/23/2021   Closed fracture of right hip (HCC) 03/10/2023   Coronary artery calcification seen on CT scan 10/13/2017   Essential hypertension 06/14/2022   Gastrointestinal hemorrhage 05/20/2023   High risk medication use 07/31/2022   History of cardiomyopathy 11/09/2021   History of pulmonary embolism 09/24/2012   Overview:   Last Assessment & Plan:  Hx PE x 2 per notes, ? 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  - needs auto-titration study by [employer] or local company, data to RB to adjust his home device.    OSA (obstructive sleep apnea) 09/13/2022   Prediabetes    Preoperative clearance 10/13/2017   Primary osteoarthritis involving multiple joints 06/14/2022   Prostate cancer metastatic to bone (HCC) 06/14/2022   Pulmonary embolism (HCC)    Rhinitis    Sacral wound 04/11/2023   Seasonal allergies 06/14/2022   Spondylolisthesis of lumbar region 12/12/2021   Stage 3a chronic kidney disease (HCC) 06/14/2022   Unstageable pressure ulcer of sacral region (HCC) 03/10/2023    Current Outpatient Medications  Medication Sig Dispense Refill   acetaminophen  (TYLENOL ) 500 MG tablet Take 1,000 mg by mouth in the morning and at bedtime.     Cyanocobalamin  (B-12) 5000 MCG CAPS Take 1 capsule by mouth daily at 12 noon.     FARXIGA  5  MG TABS tablet Take 1 tablet by mouth once daily 90 tablet 0   KLOR-CON  M20 20 MEQ tablet Take 20 mEq by mouth daily.     metoprolol  tartrate (LOPRESSOR ) 50 MG tablet TAKE 1 & 1/2 (ONE & ONE-HALF) TABLETS BY MOUTH TWICE DAILY 180 tablet 0   Multiple Vitamin (MULTIVITAMIN WITH MINERALS) TABS tablet Take 1 tablet by mouth daily.     polyethylene glycol (MIRALAX  / GLYCOLAX ) 17 g packet Take 17 g by mouth daily as needed for mild  constipation.     rivaroxaban  (XARELTO ) 20 MG TABS tablet Take 1 tablet (20 mg total) by mouth daily with supper.     rosuvastatin  (CRESTOR ) 5 MG tablet Take 1 tablet by mouth once daily 90 tablet 2   sacubitril -valsartan  (ENTRESTO ) 24-26 MG Take 1 tablet by mouth 2 (two) times daily. 180 tablet 3   tamsulosin  (FLOMAX ) 0.4 MG CAPS capsule TAKE 1 CAPSULE BY MOUTH TWICE DAILY 30 MINUTES FOLLOWING  THE  SAME  MEAL  EACH  DAY 180 capsule 0   torsemide  (DEMADEX ) 20 MG tablet Take 1 tablet by mouth twice daily 180 tablet 0   traMADol  (ULTRAM ) 50 MG tablet Take 2 tablets (100 mg total) by mouth every 8 (eight) hours as needed. 120 tablet 1   methocarbamol  (ROBAXIN ) 500 MG tablet Take 500 mg by mouth 4 (four) times daily. (Patient not taking: Reported on 08/12/2023)     No current facility-administered medications for this visit.    Physical Exam BP (!) 97/53   Pulse 73   Resp 20   Ht 5' 10 (1.778 m)   Wt 168 lb (76.2 kg)   SpO2 95% Comment: RA  BMI 24.11 kg/m  Frail appearing 78 year old man in no acute distress, but obvious discomfort with movement Alert and oriented x 3, right foot in brace Lungs clear with equal breath sounds bilaterally Cardiac regular rate and rhythm with a 2/6 diastolic murmur No peripheral edema  Diagnostic Tests: CT ANGIOGRAPHY CHEST WITH CONTRAST   TECHNIQUE: Multidetector CT imaging of the chest was performed using the standard protocol during bolus administration of intravenous contrast. Multiplanar CT image reconstructions and MIPs were obtained to evaluate the vascular anatomy.   RADIATION DOSE REDUCTION: This exam was performed according to the departmental dose-optimization program which includes automated exposure control, adjustment of the mA and/or kV according to patient size and/or use of iterative reconstruction technique.   CONTRAST:  OMNIPAQUE  IOHEXOL  350 MG/ML SOLN   COMPARISON:  CT chest dated 02/20/2023    FINDINGS: Cardiovascular:   Aortic Root:   --Valve: 3.2 x 2.6 cm   --Sinuses: 5.3 x 4.7 x 4.7 cm   --Sinotubular Junction: 5.5 x 5.5 cm   Limitations by motion: None   Thoracic Aorta:   --Ascending Aorta: 5.4 x 5.3 cm   --Aortic Arch: 3.9 x 3.8 cm   --Descending Aorta: 3.4 x 3.4 cm   Mildly tortuous descending thoracic aorta.   Other:   Similar marked multichamber cardiomegaly. No pericardial effusion. Coronary artery calcifications and aortic atherosclerosis.   Mediastinum/Nodes: Imaged thyroid  gland without nodules meeting criteria for imaging follow-up by size. Normal esophagus. No pathologically enlarged axillary, supraclavicular, mediastinal, or hilar lymph nodes.   Lungs/Pleura: The central airways are patent. Subsegmental mucous plugging in the right lower lobe. Subsegmental right lower lobe atelectasis. No focal consolidation. No pneumothorax. No pleural effusion.   Upper abdomen: The right and left hepatic arteries arise separately from the celiac artery.   Musculoskeletal: No  acute or abnormal lytic or blastic osseous lesions. Prior L1 compression fracture with retropulsion status post vertebral augmentation.   Review of the MIP images confirms the above findings.   IMPRESSION: 1. Aneurysmal dilatation of the sino-tubular junction and ascending thoracic aorta, unchanged. 2. Similar marked multichamber cardiomegaly. 3. Subsegmental mucous plugging in the right lower lobe, which may reflect bronchitis or aspiration. 4. Aortic Atherosclerosis (ICD10-I70.0). Coronary artery calcifications. Assessment for potential risk factor modification, dietary therapy or pharmacologic therapy may be warranted, if clinically indicated.     Electronically Signed   By: Limin  Xu M.D.   On: 07/24/2023 13:42  I personally reviewed the CT images.  Compared them to his previous films dating back to 2021.  There is aortic and coronary calcification/atherosclerosis.   Aortic root and ascending aneurysm unchanged.  Tortuous aorta making precise measurement difficult.  Measures approximately 5.4 to 5.5 cm at the sinuses of Valsalva.  Mid ascending aorta 4.8 cm when measured at level of right pulmonary artery.  Unchanged from 2021.  Impression: Tzvi Economou is a 78 year old man with a known aortic root and ascending aortic aneurysm, moderate aortic insufficiency, moderate mitral insufficiency, atrial fibrillation, hypertension, cardiomyopathy, hyperlipidemia, coronary calcification, prostate cancer, pulmonary embolism, stage II chronic kidney disease, lumbar radiculopathy, right foot drop, and chronic pain.  Aortic root/ascending aneurysm-difficult to measure precisely due to extreme curvature and radiologist does not say exactly where their measurements were taken.  Diameter at sinuses of Valsalva is about 5.5 cm.  Mid ascending aorta at level of right PA is 4.8 cm.  Both unchanged.  Needs continued semiannual follow-up.  I discussed with Mr. and Mrs. Nanna that there is no contraindication to his planned spinal surgery due to his aorta.  That does not mean that nothing could happen as there is a known increase in the rate of aneurysmal complications after surgery.  However he is in extreme pain and clearly needs to the operation done.  As it currently stands is no indication for surgery on his thoracic aorta.   Plan: Can proceed with planned back surgery Return in 6 months with CT angio of the chest  Elspeth JAYSON Millers, MD [facility] [phone]

## 2023-08-13 ENCOUNTER — Telehealth: Payer: Self-pay | Admitting: Primary Care

## 2023-08-13 ENCOUNTER — Ambulatory Visit: Admitting: Primary Care

## 2023-08-13 ENCOUNTER — Encounter: Payer: Self-pay | Admitting: Primary Care

## 2023-08-13 VITALS — BP 103/47 | HR 71 | Temp 97.6°F | Ht 70.0 in | Wt 168.4 lb

## 2023-08-13 DIAGNOSIS — G4733 Obstructive sleep apnea (adult) (pediatric): Secondary | ICD-10-CM | POA: Diagnosis not present

## 2023-08-13 NOTE — Telephone Encounter (Signed)
 Can we fax surgical clearance to Dr. Onetha with [facility]

## 2023-08-13 NOTE — Progress Notes (Signed)
 @Patient  ID: Carlin FORBES Running, male    DOB: 1945/11/28, 78 y.o.   MRN: [id number]  Chief Complaint  Patient presents with   Consult    Surgical Clearance- Spinal surgery at [facility] at [facility].     Referring provider: Fleeta Valeria Mayo, MD  HPI: Discussed the use of AI scribe software for clinical note transcription with the patient, who gave verbal consent to proceed.  History of Present Illness LEANORD THIBEAU is a 78 year old male with severe sleep apnea who presents for a surgical assessment for spinal surgery. He was referred by Dr. Onetha at [facility] for surgical assessment.  He is preparing for spinal surgery involving extensive fusion with rods and screws from the top to the bottom of the spine. No surgery date has been set yet.  He has severe sleep apnea and uses a BiPAP machine with settings of nineteen over fifteen. Despite compliance, he experiences approximately twenty-six apneic events per hour. A titration study from October of the previous year showed severe sleep apnea with fifty-nine apneas per hour. Obstructive apneas were controlled on CPAP of fifteen, but central apneas led to the switch to BiPAP. He has tried different masks, but all have issues with air leaks.  He is on tramadol  100 mg every eight hours for pain management. He is not taking methocarbamol  or any other sedatives.  He has a history of atrial fibrillation and is on Xarelto  for anticoagulation. He had a pulmonary embolism twenty years ago. No history of blood clots in recent years. No history of COPD, asthma, or recent pneumonia. He had pneumonia at the age of [age].  No issues with anesthesia in the past, including emergent intubation. No chronic cough, shortness of breath, or aspiration symptoms.  Airview download 07/13/23-08/11/23 Usage days 30/30 days Average usage 9 hours 53 mins Pressure 19/15cm h20 Airleaks 111L/min (95%) AHI 26.1   Allergies  Allergen Reactions    Ciprofloxacin Other (See Comments)    Contraindicated because of increased risk of rupture of ascending thoracic aortic aneurysm    Immunization History  Administered Date(s) Administered   Influenza Inj Mdck Quad Pf 11/23/2021   Influenza Inj Mdck Quad With Preservative 10/22/2018, 11/12/2019, 11/15/2020   Influenza, Mdck, Trivalent,PF 6+ MOS(egg free) 11/11/2022   Influenza-Unspecified 11/07/2017    Past Medical History:  Diagnosis Date   Aneurysm of aortic root    Overview:  Last Assessment & Plan:  Planning to see Dr Army for evaluation of 5cm aneurysm.  - will check PFT in prep for possible procedure / SGY   Aortic atherosclerosis (HCC) 06/14/2022   Aortic regurgitation    Aortic valve insufficiency    Ascending aortic aneurysm (HCC) 09/11/2016   Atherosclerotic vascular disease 05/27/2022   Atrial fibrillation (HCC)    Atrial fibrillation with RVR (HCC) 07/05/2022   BMI 27.0-27.9,adult 06/14/2022   BPH (benign prostatic hyperplasia)    BRBPR (bright red blood per rectum) 05/20/2023   Cardiomyopathy (HCC) 09/11/2016   CKD (chronic kidney disease) stage 2, GFR 60-89 ml/min 11/23/2021   Closed fracture of right hip (HCC) 03/10/2023   Coronary artery calcification seen on CT scan 10/13/2017   Essential hypertension 06/14/2022   Gastrointestinal hemorrhage 05/20/2023   High risk medication use 07/31/2022   History of cardiomyopathy 11/09/2021   History of pulmonary embolism 09/24/2012   Overview:  Last Assessment & Plan:  Hx PE x 2 per notes, ? 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  - needs auto-titration study by [employer] or local company, data to RB to adjust his home device.    OSA (obstructive sleep apnea) 09/13/2022   Prediabetes    Preoperative clearance 10/13/2017   Primary osteoarthritis involving multiple joints 06/14/2022   Prostate cancer metastatic to bone (HCC) 06/14/2022   Pulmonary embolism (HCC)    Rhinitis    Sacral wound 04/11/2023   Seasonal allergies 06/14/2022   Spondylolisthesis of lumbar region 12/12/2021   Stage 3a chronic kidney disease (HCC) 06/14/2022   Unstageable pressure ulcer of sacral region (HCC) 03/10/2023    Tobacco History: Social History   Tobacco Use  Smoking Status Never  Smokeless Tobacco Never   Counseling given: Not Answered   Outpatient Medications Prior to Visit  Medication Sig Dispense Refill   acetaminophen  (TYLENOL ) 500 MG tablet Take 1,000 mg by mouth in the morning and at bedtime.     Cyanocobalamin  (B-12) 5000 MCG CAPS Take 1 capsule by mouth daily at 12 noon.     FARXIGA  5 MG TABS tablet Take 1 tablet by mouth once daily 90 tablet 0   KLOR-CON  M20 20 MEQ tablet Take 20 mEq by mouth daily.     methocarbamol  (ROBAXIN ) 500 MG tablet Take 500 mg by mouth 4 (four) times daily.     metoprolol  tartrate (LOPRESSOR ) 50 MG tablet TAKE 1 & 1/2 (ONE & ONE-HALF) TABLETS BY MOUTH TWICE DAILY 180 tablet 0   Multiple Vitamin (MULTIVITAMIN WITH MINERALS) TABS tablet Take 1 tablet by mouth daily.     polyethylene glycol (MIRALAX  / GLYCOLAX ) 17 g packet Take 17 g by mouth daily as needed for mild constipation.     rivaroxaban  (XARELTO ) 20 MG TABS tablet Take 1 tablet (20 mg total) by mouth daily with supper.     rosuvastatin  (CRESTOR ) 5 MG tablet Take 1 tablet by mouth once daily 90 tablet 2   sacubitril -valsartan  (ENTRESTO ) 24-26 MG Take 1  tablet by mouth 2 (two) times daily. 180 tablet 3   tamsulosin  (FLOMAX ) 0.4 MG CAPS capsule TAKE 1 CAPSULE BY MOUTH TWICE DAILY 30 MINUTES FOLLOWING  THE  SAME  MEAL  EACH  DAY 180 capsule 0   torsemide  (DEMADEX ) 20 MG tablet Take 1 tablet by mouth twice daily 180 tablet 0   traMADol  (ULTRAM ) 50 MG tablet Take 2 tablets (100 mg total) by mouth every 8 (eight) hours as needed. 120 tablet 1   No facility-administered medications prior to visit.    Review of Systems  Review of Systems  Constitutional: Negative.   HENT: Negative.    Respiratory: Negative.  Negative for cough, shortness of breath and wheezing.      Physical Exam  BP (!) 103/47   Pulse 71   Temp 97.6 F (36.4 C)   Ht 5' 10 (1.778 m)   Wt 168 lb 6.4 oz (76.4 kg)   SpO2 97% Comment: RA  BMI 24.16 kg/m  Physical Exam Constitutional:  General: He is not in acute distress.    Appearance: Normal appearance. He is not ill-appearing.  HENT:     Head: Normocephalic and atraumatic.  Cardiovascular:     Rate and Rhythm: Normal rate and regular rhythm.  Pulmonary:     Effort: Pulmonary effort is normal.     Breath sounds: Normal breath sounds.     Comments: CTA Musculoskeletal:        General: Normal range of motion.  Skin:    General: Skin is warm and dry.  Neurological:     General: No focal deficit present.     Mental Status: He is alert and oriented to person, place, and time. Mental status is at baseline.  Psychiatric:        Mood and Affect: Mood normal.        Behavior: Behavior normal.        Thought Content: Thought content normal.        Judgment: Judgment normal.     Lab Results:  CBC    Component Value Date/Time   WBC 7.2 05/23/2023 0529   RBC 3.21 (L) 05/23/2023 0529   HGB 10.0 (L) 05/23/2023 0529   HGB 12.2 (L) 03/10/2023 1444   HCT 31.9 (L) 05/23/2023 0529   HCT 37.5 03/10/2023 1444   PLT 176 05/23/2023 0529   PLT 361 03/10/2023 1444   MCV 99.4 05/23/2023 0529   MCV 93 03/10/2023  1444   MCH 31.2 05/23/2023 0529   MCHC 31.3 05/23/2023 0529   RDW 15.1 05/23/2023 0529   RDW 14.1 03/10/2023 1444   LYMPHSABS 1.5 03/10/2023 1444   MONOABS 0.9 10/20/2012 1029   EOSABS 0.0 03/10/2023 1444   BASOSABS 0.0 03/10/2023 1444    BMET    Component Value Date/Time   NA 142 08/04/2023 1010   K 3.9 08/04/2023 1010   CL 99 08/04/2023 1010   CO2 26 08/04/2023 1010   GLUCOSE 99 08/04/2023 1010   GLUCOSE 78 05/23/2023 0529   BUN 18 08/04/2023 1010   CREATININE 1.27 08/04/2023 1010   CREATININE 1.20 01/04/2014 1006   CALCIUM  9.2 08/04/2023 1010   GFRNONAA >60 05/23/2023 0529   GFRAA 80 11/13/2018 0924    BNP No results found for: BNP  ProBNP    Component Value Date/Time   PROBNP 1,382 (H) 11/08/2022 1623    Imaging: CT ANGIO CHEST AORTA W/CM & OR WO/CM Result Date: 07/24/2023 CLINICAL DATA:  Thoracic aortic aneurysm EXAM: CT ANGIOGRAPHY CHEST WITH CONTRAST TECHNIQUE: Multidetector CT imaging of the chest was performed using the standard protocol during bolus administration of intravenous contrast. Multiplanar CT image reconstructions and MIPs were obtained to evaluate the vascular anatomy. RADIATION DOSE REDUCTION: This exam was performed according to the departmental dose-optimization program which includes automated exposure control, adjustment of the mA and/or kV according to patient size and/or use of iterative reconstruction technique. CONTRAST:  OMNIPAQUE  IOHEXOL  350 MG/ML SOLN COMPARISON:  CT chest dated 02/20/2023 FINDINGS: Cardiovascular: Aortic Root: --Valve: 3.2 x 2.6 cm --Sinuses: 5.3 x 4.7 x 4.7 cm --Sinotubular Junction: 5.5 x 5.5 cm Limitations by motion: None Thoracic Aorta: --Ascending Aorta: 5.4 x 5.3 cm --Aortic Arch: 3.9 x 3.8 cm --Descending Aorta: 3.4 x 3.4 cm Mildly tortuous descending thoracic aorta. Other: Similar marked multichamber cardiomegaly. No pericardial effusion. Coronary artery calcifications and aortic atherosclerosis.  Mediastinum/Nodes: Imaged thyroid  gland without nodules meeting criteria for imaging follow-up by size. Normal esophagus. No pathologically enlarged axillary, supraclavicular, mediastinal, or hilar lymph nodes.  Lungs/Pleura: The central airways are patent. Subsegmental mucous plugging in the right lower lobe. Subsegmental right lower lobe atelectasis. No focal consolidation. No pneumothorax. No pleural effusion. Upper abdomen: The right and left hepatic arteries arise separately from the celiac artery. Musculoskeletal: No acute or abnormal lytic or blastic osseous lesions. Prior L1 compression fracture with retropulsion status post vertebral augmentation. Review of the MIP images confirms the above findings. IMPRESSION: 1. Aneurysmal dilatation of the sino-tubular junction and ascending thoracic aorta, unchanged. 2. Similar marked multichamber cardiomegaly. 3. Subsegmental mucous plugging in the right lower lobe, which may reflect bronchitis or aspiration. 4. Aortic Atherosclerosis (ICD10-I70.0). Coronary artery calcifications. Assessment for potential risk factor modification, dietary therapy or pharmacologic therapy may be warranted, if clinically indicated. Electronically Signed   By: Limin  Xu M.D.   On: 07/24/2023 13:42   CT CORONARY FRACTIONAL FLOW RESERVE FLUID ANALYSIS Result Date: 07/17/2023 EXAM: CT FFR ANALYSIS CLINICAL DATA:  CAD FINDINGS: FFRct analysis was performed on the original cardiac CT angiogram dataset. Diagrammatic representation of the FFRct analysis is provided in a separate PDF document in PACS. This dictation was created using the PDF document and an interactive 3D model of the results. 3D model is not available in the EMR/PACS. Normal FFR range is >0.80. Indeterminate (grey) zone is 0.76-0.80. 1. Left Main: FFR = 1.00 2. LAD: Proximal FFR = 0.96, mid FFR = 0.93, distal FFR = 0.89 3. LCX: Proximal FFR = 0.97, distal FFR = 0.94 4. RCA: Proximal FFR = 0.98, mid FFR =0.98, distal FFR =  0.99 IMPRESSION: 1.  CT FFR analysis showed no significant stenosis. RECOMMENDATIONS: Guideline-directed medical therapy and aggressive risk factor modification for secondary prevention of coronary artery disease. Electronically Signed   By: Kardie  Tobb D.O.   On: 07/17/2023 18:22   CT CORONARY MORPH W/CTA COR W/SCORE W/CA W/CM &/OR WO/CM Addendum Date: 07/17/2023 ADDENDUM REPORT: 07/17/2023 15:42 CLINICAL DATA:  This is a 78 year old male with anginal symptoms. EXAM: Cardiac/Coronary  CTA TECHNIQUE: The patient was scanned on a [employer]. FINDINGS: A 100 kV prospective scan was triggered in the descending thoracic aorta at 111 HU's. Axial non-contrast 3 mm slices were carried out through the heart. The data set was analyzed on a dedicated work station and scored using the Agatson method. Gantry rotation speed was 250 msecs and collimation was .6 mm. No beta blockade and 0.8 mg of sl NTG was given. The 3D data set was reconstructed in 5% intervals of the 67-82 % of the R-R cycle. Diastolic phases were analyzed on a dedicated work station using MPR, MIP and VRT modes. The patient received 80 cc of contrast. Aorta: The proximal ascending aorta is anuerysm (52 mm) in size. No calcifications. No dissection. Aortic Valve:  Trileaflet.  No calcifications. Coronary Arteries:  Normal coronary origin. Right Dominance. RCA is a large dominant artery that gives rise to PDA and PLA. The RCA is a torturous artery. In the Proximal RCA there is a mild focal calcification. Mid RCA in a bend there appears to be a mild soft plaque with minimal diffuse calcifications. Distal RCA with two separate minimal mixed plaques. Left main is a large artery that gives rise to LAD and LCX arteries. Minimal calcification in the mid left main vessel. LAD is a large vessel. There is a focal mild calcified plaque in the ostial LAD. There is diffuse minimal (<24%) calcified plaque in the proximal LAD. The mid LAD with mild (25-49%)  diffuse calcifications right below  is a mid soft plaque. The distal LAD with minimal focal plaques. First diagonal branch is a medium sized vessel with moderate (50-69%) soft plaque in the proximal portion of the vessel. LCX is a non-dominant artery that gives rise to one large OM1 branch. Diffuse minimal calcifications throughout the LCX artery. First obtuse marginal with mild calcified plaque in the proximal portion of the vessel. Coronary Calcium  Score: Left main: 29.3 Left anterior descending artery: 918 Left circumflex artery: 391 Right coronary artery: 621 Total: 1959 Percentile: 88 Other findings: Normal pulmonary vein drainage into the left atrium. Normal left atrial appendage without a thrombus. Normal size of the pulmonary artery. IMPRESSION: 1. Coronary calcium  score of 1959. This was 35 percentile for age and sex matched control. 2. Normal coronary origin with right dominance. 3. CAD-RADS 3. Moderate stenosis. Consider symptom-guided anti-ischemic pharmacotherapy as well as risk factor modification per guideline directed care. Additional analysis with CT FFR . 4. Total plaque volume 2015 mm3 which is 94th percentile for age- and, sex-matched controls (calcified plaque 441 mm3; non-calcified plaque 1574 mm3, Low attenuation 8 mm3). TPV is extensive. The noncardiac portion of this study will be interpreted in separate report by the radiologist. Electronically Signed   By: Kardie  Tobb D.O.   On: 07/17/2023 15:42   Addendum Date: 07/17/2023 ADDENDUM REPORT: 07/17/2023 14:05 EXAM: OVER-READ INTERPRETATION  CT CHEST The following report is an over-read performed by radiologist Dr. Ester Imam [facility] on 07/17/2023. This over-read does not include interpretation of cardiac or coronary anatomy or pathology. The coronary CTA interpretation by the cardiologist is attached. COMPARISON:  02/20/2023 FINDINGS: Cardiovascular: Similar appearing biatrial cardiomegaly. Scattered three-vessel  coronary atherosclerotic calcifications. Similar appearing fusiform ascending thoracic aortic aneurysm measuring up to 53 mm. Scattered atherosclerotic calcifications about the visualized thoracic aorta. Mediastinum/Nodes: Visualized esophagus and trachea within normal limits. No mediastinal lymphadenopathy. Lungs/Pleura: The lungs are clear bilaterally. Upper Abdomen: The visualized upper abdomen is within normal limits. Musculoskeletal: No acute osseous abnormality. IMPRESSION: 1. Similar appearing fusiform ascending thoracic aortic aneurysm measuring up to 5.3 cm. Ascending thoracic aortic aneurysm. Recommend semi-annual imaging followup by CTA or MRA and referral to cardiothoracic surgery if not already obtained. This recommendation follows 2010 ACCF/AHA/AATS/ACR/ASA/SCA/SCAI/SIR/STS/SVM Guidelines for the Diagnosis and Management of Patients With Thoracic Aortic Disease. Circulation. 2010; 121: [id number]. Aortic aneurysm NOS (ICD10-I71.9) 2. Coronary and aortic atherosclerosis (ICD10-I70.0). Ester Sides, MD [facility] Electronically Signed   By: Ester Sides M.D.   On: 07/17/2023 14:05   Result Date: 07/17/2023 CLINICAL DATA:  This is a 78 year old male with anginal symptoms. EXAM: Cardiac/Coronary  CTA TECHNIQUE: The patient was scanned on a [employer]. FINDINGS: A 100 kV prospective scan was triggered in the descending thoracic aorta at 111 HU's. Axial non-contrast 3 mm slices were carried out through the heart. The data set was analyzed on a dedicated work station and scored using the Agatson method. Gantry rotation speed was 250 msecs and collimation was .6 mm. No beta blockade and 0.8 mg of sl NTG was given. The 3D data set was reconstructed in 5% intervals of the 67-82 % of the R-R cycle. Diastolic phases were analyzed on a dedicated work station using MPR, MIP and VRT modes. The patient received 80 cc of contrast. Aorta:    Size.  No calcifications.  No dissection. Aortic Valve:  Trileaflet.  No calcifications. Coronary Arteries:  Normal coronary origin. Right Dominance. RCA is a large dominant artery that  gives rise to PDA and PLA. The RCA is a torturous artery. In the Proximal RCA there is a mild focal calcification. Mid RCA in a bend there appears to be a mild soft plaque with minimal diffuse calcifications. Distal RCA with two separate minimal mixed plaques. Left main is a large artery that gives rise to LAD and LCX arteries. Minimal calcification in the mid left main vessel. LAD is a large vessel that has no plaque. There is a focal mild calcified plaque in the ostial LAD. There is diffuse minimal (<24%) calcified plaque in the proximal LAD. The mid LAD with mild (25-49%) diffuse calcifications right below is a mid soft plaque. The distal LAD with minimal focal plaques. First diagonal branch is a medium sized vessel with moderate (50-69%) soft plaque in the proximal portion of the vessel. LCX is a non-dominant artery that gives rise to one large OM1 branch. Diffuse minimal calcifications throughout the LCX artery. First obtuse marginal with mild calcified plaque in the proximal portion of the vessel. Coronary Calcium  Score: Left main: 29.3 Left anterior descending artery: 918 Left circumflex artery: 391 Right coronary artery: 621 Total: 1959 Percentile: 88 Other findings: Normal pulmonary vein drainage into the left atrium. Normal left atrial appendage without a thrombus. Normal size of the pulmonary artery. IMPRESSION: 1. Coronary calcium  score of 1959. This was 37 percentile for age and sex matched control. 2. Normal coronary origin with right dominance. 3. CAD-RADS 3. Moderate stenosis. Consider symptom-guided anti-ischemic pharmacotherapy as well as risk factor modification per guideline directed care. Additional analysis with CT FFR . 4. Total plaque volume 2015 mm3 which is 94th percentile for age- and, sex-matched controls  (calcified plaque 441 mm3; non-calcified plaque 1574 mm3, Low attenuation 8 mm3). TPV is extensive. The noncardiac portion of this study will be interpreted in separate report by the radiologist. Electronically Signed: By: Kardie  Tobb D.O. On: 07/17/2023 13:58     Assessment & Plan:   1. OSA (obstructive sleep apnea) (Primary)   Assessment and Plan Assessment & Plan Severe obstructive and central sleep apnea Severe obstructive and central sleep apnea with current BiPAP settings of 19/15 not adequately controlling apneic events, with 26 events per hour. Previous titration study showed 59 apneas per hour, with obstructive apneas controlled on CPAP of 15, but central apneas necessitated switch to BiPAP. Current mask leaks contributing to inadequate control.  - Arrange for respiratory therapist to fit patient for BiPAP in the hospital/ post anesthesia recovery unit - Encourage post-operative monitoring in PACU with BiPAP. - Discussed mask desensitization study in sleep lab for mask fitting post-surgery. - Contact via MyChart for non-emergent communication regarding sleep apnea management.  Pre-op respiratory evaluation Respiratory exam is normal. No recent respiratory infections. Patient is optimized for surgery from pulmonary standpoint. He will be considered intermediate risk for prolonged mechanical ventilation and/or post op pulmonary complications due to age, type/length of surgery and OSA. Ultimate clearance will be decided upon by surgeon.   Major Pulmonary risks identified in the multifactorial risk analysis are but not limited to a) pneumonia; b) recurrent intubation risk; c) prolonged or recurrent acute respiratory failure needing mechanical ventilation; d) prolonged hospitalization; e) DVT/Pulmonary embolism; f) Acute Pulmonary edema  Recommend 1. Short duration of surgery as much as possible and avoid paralytic if possible 2. Recovery in step down or ICU with Pulmonary consultation  if needed  3. DVT prophylaxis 4. Aggressive pulmonary toilet with o2, bronchodilatation, and incentive spirometry and early ambulation  Atrial fibrillation Atrial  fibrillation managed with Xarelto . No recent blood clots, but pulmonary embolism 20 years ago. No current issues with anticoagulation management. - Continue Xarelto  for atrial fibrillation management. - Ensure surgical team is aware of Xarelto  use and follow his instructions regarding perioperative management.     Almarie LELON Ferrari, NP 08/13/2023

## 2023-08-13 NOTE — Patient Instructions (Signed)
  VISIT SUMMARY: You came in today for a surgical assessment for your upcoming spinal surgery. We discussed your severe sleep apnea and its management, as well as your history of atrial fibrillation. We reviewed your current treatments and made plans to ensure your conditions are managed effectively during and after your surgery.  YOUR PLAN: -SEVERE OBSTRUCTIVE AND CENTRAL SLEEP APNEA: Severe obstructive and central sleep apnea means you have difficulty breathing properly during sleep due to both blocked airways and issues with the brain's control over breathing. Your current BiPAP settings are not fully controlling your sleep apnea, which increases your risk for heart problems, stroke, and other health issues. We will ensure the anesthesia team is aware of your condition and recommend using your BiPAP machine after surgery. A respiratory therapist will help fit your BiPAP for hospital use, and we will monitor you closely after surgery. We also discussed getting mask desensitization study after surgery to help find a better fitting mask. Please call/send mychart message when you are able to have test so we can order  -ATRIAL FIBRILLATION: Atrial fibrillation is an irregular and often rapid heart rate that can increase your risk of strokes and other heart-related complications. You are currently managing this with Xarelto , a blood thinner. We will continue this medication and make sure the surgical team is aware of it to manage your care properly during surgery.  INSTRUCTIONS: Continue to wear BIPAP nightly. Please ensure respiratory therapy is aware that you will need BiPAP machine in the PACU after your surgery.  Follow the surgical team's instructions regarding your Xarelto  use before and after surgery.

## 2023-08-14 NOTE — Telephone Encounter (Signed)
 Done- note faxed to [facility] at [phone].

## 2023-08-14 NOTE — Telephone Encounter (Signed)
 Done- note from 8/6 with Beth faxed to [facility] at [phone].

## 2023-08-15 ENCOUNTER — Other Ambulatory Visit: Payer: Self-pay | Admitting: Internal Medicine

## 2023-08-15 ENCOUNTER — Other Ambulatory Visit: Payer: Self-pay | Admitting: Neurosurgery

## 2023-08-24 ENCOUNTER — Other Ambulatory Visit: Payer: Self-pay | Admitting: Internal Medicine

## 2023-08-27 DIAGNOSIS — G4733 Obstructive sleep apnea (adult) (pediatric): Secondary | ICD-10-CM | POA: Diagnosis not present

## 2023-08-28 ENCOUNTER — Encounter (HOSPITAL_COMMUNITY): Payer: Self-pay

## 2023-08-28 NOTE — Pre-Procedure Instructions (Signed)
 Surgical Instructions   Your procedure is scheduled on September 03, 2023. Report to [facility] Main Entrance A at 5:30 A.M., then check in with the Admitting office. Any questions or running late day of surgery: call [phone]  Questions prior to your surgery date: call [phone], Monday-Friday, 8am-4pm. If you experience any cold or flu symptoms such as cough, fever, chills, shortness of breath, etc. between now and your scheduled surgery, please notify us  at the above number.     Remember:  Do not eat or drink after midnight the night before your surgery    Take these medicines the morning of surgery with A SIP OF WATER: acetaminophen  (TYLENOL )  methocarbamol  (ROBAXIN )  metoprolol  tartrate (LOPRESSOR )  rosuvastatin  (CRESTOR )  tamsulosin  (FLOMAX )    May take these medicines IF NEEDED: traMADol  (ULTRAM )    STOP taking your FARXIGA  three days prior to surgery. Your last dose will be August 23rd.  Follow your surgeon's instructions on when to stop rivaroxaban  (XARELTO ).  If no instructions were given by your surgeon then you will need to call the office to get those instructions.     One week prior to surgery, STOP taking any Aspirin (unless otherwise instructed by your surgeon) Aleve, Naproxen, Ibuprofen, Motrin, Advil, Goody's, BC's, all herbal medications, fish oil, and non-prescription vitamins.                     Do NOT Smoke (Tobacco/Vaping) for 24 hours prior to your procedure.  If you use a CPAP at night, you may bring your mask/headgear for your overnight stay.   You will be asked to remove any contacts, glasses, piercing's, hearing aid's, dentures/partials prior to surgery. Please bring cases for these items if needed.    Patients discharged the day of surgery will not be allowed to drive home, and someone needs to stay with them for 24 hours.  SURGICAL WAITING ROOM VISITATION Patients may have no more than 2 support people in the waiting area - these  visitors may rotate.   Pre-op nurse will coordinate an appropriate time for 1 ADULT support person, who may not rotate, to accompany patient in pre-op.  Children under the age of 106 must have an adult with them who is not the patient and must remain in the main waiting area with an adult.  If the patient needs to stay at the hospital during part of their recovery, the visitor guidelines for inpatient rooms apply.  Please refer to the [facility] website for the visitor guidelines for any additional information.   If you received a COVID test during your pre-op visit  it is requested that you wear a mask when out in public, stay away from anyone that may not be feeling well and notify your surgeon if you develop symptoms. If you have been in contact with anyone that has tested positive in the last 10 days please notify you surgeon.      Pre-operative 5 CHG Bathing Instructions   You can play a key role in reducing the risk of infection after surgery. Your skin needs to be as free of germs as possible. You can reduce the number of germs on your skin by washing with CHG (chlorhexidine gluconate) soap before surgery. CHG is an antiseptic soap that kills germs and continues to kill germs even after washing.   DO NOT use if you have an allergy to chlorhexidine/CHG or antibacterial soaps. If your skin becomes reddened or irritated, stop using the CHG  and notify one of our RNs at [phone].   Please shower with the CHG soap starting 4 days before surgery using the following schedule:     Please keep in mind the following:  DO NOT shave, including legs and underarms, starting the day of your first shower.   You may shave your face at any point before/day of surgery.  Place clean sheets on your bed the day you start using CHG soap. Use a clean washcloth (not used since being washed) for each shower. DO NOT sleep with pets once you start using the CHG.   CHG Shower Instructions:  Wash your  face and private area with normal soap. If you choose to wash your hair, wash first with your normal shampoo.  After you use shampoo/soap, rinse your hair and body thoroughly to remove shampoo/soap residue.  Turn the water OFF and apply about 3 tablespoons (45 ml) of CHG soap to a CLEAN washcloth.  Apply CHG soap ONLY FROM YOUR NECK DOWN TO YOUR TOES (washing for 3-5 minutes)  DO NOT use CHG soap on face, private areas, open wounds, or sores.  Pay special attention to the area where your surgery is being performed.  If you are having back surgery, having someone wash your back for you may be helpful. Wait 2 minutes after CHG soap is applied, then you may rinse off the CHG soap.  Pat dry with a clean towel  Put on clean clothes/pajamas   If you choose to wear lotion, please use ONLY the CHG-compatible lotions that are listed below.  Additional instructions for the day of surgery: DO NOT APPLY any lotions, deodorants, cologne, or perfumes.   Do not bring valuables to the hospital. [facility] is not responsible for any belongings/valuables. Do not wear nail polish, gel polish, artificial nails, or any other type of covering on natural nails (fingers and toes) Do not wear jewelry or makeup Put on clean/comfortable clothes.  Please brush your teeth.  Ask your nurse before applying any prescription medications to the skin.     CHG Compatible Lotions   Aveeno Moisturizing lotion  Cetaphil Moisturizing Cream  Cetaphil Moisturizing Lotion  Clairol Herbal Essence Moisturizing Lotion, Dry Skin  Clairol Herbal Essence Moisturizing Lotion, Extra Dry Skin  Clairol Herbal Essence Moisturizing Lotion, Normal Skin  Curel Age Defying Therapeutic Moisturizing Lotion with Alpha Hydroxy  Curel Extreme Care Body Lotion  Curel Soothing Hands Moisturizing Hand Lotion  Curel Therapeutic Moisturizing Cream, Fragrance-Free  Curel Therapeutic Moisturizing Lotion, Fragrance-Free  Curel Therapeutic  Moisturizing Lotion, Original Formula  Eucerin Daily Replenishing Lotion  Eucerin Dry Skin Therapy Plus Alpha Hydroxy Crme  Eucerin Dry Skin Therapy Plus Alpha Hydroxy Lotion  Eucerin Original Crme  Eucerin Original Lotion  Eucerin Plus Crme Eucerin Plus Lotion  Eucerin TriLipid Replenishing Lotion  Keri Anti-Bacterial Hand Lotion  Keri Deep Conditioning Original Lotion Dry Skin Formula Softly Scented  Keri Deep Conditioning Original Lotion, Fragrance Free Sensitive Skin Formula  Keri Lotion Fast Absorbing Fragrance Free Sensitive Skin Formula  Keri Lotion Fast Absorbing Softly Scented Dry Skin Formula  Keri Original Lotion  Keri Skin Renewal Lotion Keri Silky Smooth Lotion  Keri Silky Smooth Sensitive Skin Lotion  Nivea Body Creamy Conditioning Oil  Nivea Body Extra Enriched Teacher, adult education Moisturizing Lotion Nivea Crme  Nivea Skin Firming Lotion  NutraDerm 30 Skin Lotion  NutraDerm Skin Lotion  NutraDerm Therapeutic Skin Cream  NutraDerm Therapeutic Skin Lotion  ProShield  Protective Hand Cream  Provon moisturizing lotion  Please read over the following fact sheets that you were given.

## 2023-08-29 ENCOUNTER — Encounter (HOSPITAL_COMMUNITY)
Admission: RE | Admit: 2023-08-29 | Discharge: 2023-08-29 | Disposition: A | Discharge status: Home / Self Care | Source: Ambulatory Visit | Attending: Neurosurgery | Admitting: Neurosurgery

## 2023-08-29 ENCOUNTER — Encounter (HOSPITAL_COMMUNITY): Payer: Self-pay

## 2023-08-29 ENCOUNTER — Other Ambulatory Visit: Payer: Self-pay

## 2023-08-29 VITALS — BP 96/54 | HR 64 | Temp 98.0°F | Resp 19 | Ht 71.0 in | Wt 168.0 lb

## 2023-08-29 DIAGNOSIS — Z01818 Encounter for other preprocedural examination: Secondary | ICD-10-CM

## 2023-08-29 DIAGNOSIS — I129 Hypertensive chronic kidney disease with stage 1 through stage 4 chronic kidney disease, or unspecified chronic kidney disease: Secondary | ICD-10-CM | POA: Insufficient documentation

## 2023-08-29 DIAGNOSIS — N182 Chronic kidney disease, stage 2 (mild): Secondary | ICD-10-CM | POA: Diagnosis not present

## 2023-08-29 DIAGNOSIS — I7121 Aneurysm of the ascending aorta, without rupture: Secondary | ICD-10-CM | POA: Insufficient documentation

## 2023-08-29 DIAGNOSIS — N400 Enlarged prostate without lower urinary tract symptoms: Secondary | ICD-10-CM | POA: Insufficient documentation

## 2023-08-29 DIAGNOSIS — Z01812 Encounter for preprocedural laboratory examination: Secondary | ICD-10-CM | POA: Diagnosis not present

## 2023-08-29 DIAGNOSIS — Z7901 Long term (current) use of anticoagulants: Secondary | ICD-10-CM | POA: Insufficient documentation

## 2023-08-29 DIAGNOSIS — I4891 Unspecified atrial fibrillation: Secondary | ICD-10-CM | POA: Insufficient documentation

## 2023-08-29 DIAGNOSIS — Z79899 Other long term (current) drug therapy: Secondary | ICD-10-CM | POA: Diagnosis not present

## 2023-08-29 DIAGNOSIS — G4733 Obstructive sleep apnea (adult) (pediatric): Secondary | ICD-10-CM | POA: Insufficient documentation

## 2023-08-29 DIAGNOSIS — I429 Cardiomyopathy, unspecified: Secondary | ICD-10-CM | POA: Insufficient documentation

## 2023-08-29 DIAGNOSIS — E7800 Pure hypercholesterolemia, unspecified: Secondary | ICD-10-CM | POA: Diagnosis not present

## 2023-08-29 DIAGNOSIS — Z86711 Personal history of pulmonary embolism: Secondary | ICD-10-CM | POA: Diagnosis not present

## 2023-08-29 DIAGNOSIS — M5416 Radiculopathy, lumbar region: Secondary | ICD-10-CM | POA: Insufficient documentation

## 2023-08-29 DIAGNOSIS — I080 Rheumatic disorders of both mitral and aortic valves: Secondary | ICD-10-CM | POA: Diagnosis not present

## 2023-08-29 DIAGNOSIS — C61 Malignant neoplasm of prostate: Secondary | ICD-10-CM | POA: Insufficient documentation

## 2023-08-29 DIAGNOSIS — I2510 Atherosclerotic heart disease of native coronary artery without angina pectoris: Secondary | ICD-10-CM | POA: Diagnosis not present

## 2023-08-29 HISTORY — DX: Cardiac murmur, unspecified: R01.1

## 2023-08-29 HISTORY — DX: Heart failure, unspecified: I50.9

## 2023-08-29 HISTORY — DX: Cardiac arrhythmia, unspecified: I49.9

## 2023-08-29 LAB — CBC
HCT: 42.3 % (ref 39.0–52.0)
Hemoglobin: 13.5 g/dL (ref 13.0–17.0)
MCH: 30.3 pg (ref 26.0–34.0)
MCHC: 31.9 g/dL (ref 30.0–36.0)
MCV: 95.1 fL (ref 80.0–100.0)
Platelets: 232 K/uL (ref 150–400)
RBC: 4.45 MIL/uL (ref 4.22–5.81)
RDW: 14.6 % (ref 11.5–15.5)
WBC: 7.3 K/uL (ref 4.0–10.5)
nRBC: 0 % (ref 0.0–0.2)

## 2023-08-29 LAB — SURGICAL PCR SCREEN
MRSA, PCR: NEGATIVE
Staphylococcus aureus: NEGATIVE

## 2023-08-29 LAB — TYPE AND SCREEN
ABO/RH(D): O POS
Antibody Screen: NEGATIVE

## 2023-08-29 NOTE — Progress Notes (Signed)
 PCP -  Fleeta Valeria Mayo, MD  Cardiologist - Redell Leiter, MD  Pulmonary Disease - Hope Almarie ORN, NP   PPM/ICD - denies Device Orders - n/a Rep Notified - n/a  Chest x-ray - denies EKG - 08-11-23 Stress Test - 10-27-17 ECHO - 07-04-23 Cardiac Cath - denies  Sleep Study - Per patient within the last year BiPAP uses nightly  DM -denies  Blood Thinner Instructions:rivaroxaban  (XARELTO ) Last dose 08-30-23 Aspirin Instructions: denies  FARXIGA   Last dose 08-30-23  ERAS Protcol - NPO   COVID TEST- n/a   Anesthesia review: Yes, Clearance given. Hx of CAD, Afib, OSA, CKD,PE  Patient denies shortness of breath, fever, cough and chest pain at PAT appointment   All instructions explained to the patient, with a verbal understanding of the material. Patient agrees to go over the instructions while at home for a better understanding. Patient also instructed to self quarantine after being tested for COVID-19. The opportunity to ask questions was provided.

## 2023-08-30 ENCOUNTER — Other Ambulatory Visit: Payer: Self-pay | Admitting: Internal Medicine

## 2023-09-01 NOTE — Anesthesia Preprocedure Evaluation (Signed)
 Anesthesia Evaluation  Patient identified by MRN, date of birth, ID band Patient awake    Reviewed: Allergy & Precautions, H&P , NPO status , Patient's Chart, lab work & pertinent test results, reviewed documented beta blocker date and time   Airway Mallampati: IV  TM Distance: >3 FB Neck ROM: Limited    Dental  (+) Teeth Intact, Dental Advisory Given   Pulmonary sleep apnea , PE (xarelto )  severe sleep apnea and uses a BiPAP machine with settings of 19/15. Despite compliance, he experiences approximately twenty-six apneic events per hour. A titration study from October of the previous year showed severe sleep apnea with fifty-nine apneas per hour. Obstructive apneas were controlled on CPAP of fifteen, but central apneas led to the switch to BiPAP. He has tried different masks, but all have issues with air leaks.- per pulm, however, he is optimized at this point   Pulmonary exam normal breath sounds clear to auscultation       Cardiovascular hypertension (95/45 preop), Pt. on medications and Pt. on home beta blockers +CHF (LVEF 35-40%)  + dysrhythmias (xarelto  LD 8/23) Atrial Fibrillation + Valvular Problems/Murmurs (mild-mod MR, mod AI) MR and AI  Rhythm:Regular Rate:Normal   1. Left ventricular ejection fraction, by estimation, is 35 to 40%. Left  ventricular ejection fraction by 2D MOD biplane is 36.4 %. The left  ventricle has moderately decreased function. The left ventricle  demonstrates global hypokinesis. The left  ventricular internal cavity size was mildly to moderately dilated. Left  ventricular diastolic parameters are indeterminate.   2. Right ventricular systolic function is normal. The right ventricular  size is normal.   3. Left atrial size was severely dilated.   4. Right atrial size was mild to moderately dilated.   5. The mitral valve is normal in structure. Mild to moderate mitral valve  regurgitation.   6. The  aortic valve is tricuspid. Aortic valve regurgitation is moderate.   7. Aortic dilatation noted. There is severe dilatation of the ascending  aorta, measuring 56 mm.    CCTA on 07/17/2023 that showed CAC 1959 (88th percentile) with 25-49% mid LAD and 50-69% D1 with FFR ct showing no significant stenosis.   By recent CT in July 2025, ascending TAA measures approximately 5.4 to 5.5 cm at the sinuses of Valsalva (tortuous aorta make precise measurement difficult.). Mid ascending aorta 4.8 cm when measured at the level of the right pulmonary artery, unchanged from 2021.    Neuro/Psych negative neurological ROS  negative psych ROS   GI/Hepatic negative GI ROS, Neg liver ROS,,,  Endo/Other  negative endocrine ROS    Renal/GU negative Renal ROS  negative genitourinary   Musculoskeletal  (+) Arthritis , Osteoarthritis,    Abdominal   Peds negative pediatric ROS (+)  Hematology negative hematology ROS (+) Hb 13.5   Anesthesia Other Findings   Reproductive/Obstetrics negative OB ROS                              Anesthesia Physical Anesthesia Plan  ASA: 4  Anesthesia Plan: General   Post-op Pain Management: Tylenol  PO (pre-op)*   Induction: Intravenous  PONV Risk Score and Plan: 2 and Ondansetron , Dexamethasone  and Treatment may vary due to age or medical condition  Airway Management Planned: Oral ETT and Video Laryngoscope Planned  Additional Equipment: Arterial line  Intra-op Plan:   Post-operative Plan: Extubation in OR and Possible Post-op intubation/ventilation  Informed Consent: I have  reviewed the patients History and Physical, chart, labs and discussed the procedure including the risks, benefits and alternatives for the proposed anesthesia with the patient or authorized representative who has indicated his/her understanding and acceptance.     Dental advisory given  Plan Discussed with: CRNA  Anesthesia Plan Comments: (NIBP low-  took ARB this AM  Long d/w patient and family about multiple comorbidities and the length of surgery- possibility of blood transfusion and postoperative intubation/ventilation as well as ICU stay. They wish to proceed as planned.   Arterial line for hemodynamic monitoring as well as intra-op/postoperative ABGs)         Anesthesia Quick Evaluation

## 2023-09-01 NOTE — Progress Notes (Signed)
 Anesthesia Chart Review:  Case: [id number] Date/Time: 09/03/23 0715   Procedure: LAMINECTOMY WITH POSTERIOR LATERAL ARTHRODESIS LEVEL 4 (Back) - Posterior lateral fusion - T10-T11 - T11-T12 - T12-L1 - L1-L2 - L2-L3 - L3-L4, laminectomy and foraminotomy transpedicular decompression L1   Anesthesia type: General   Diagnosis: Lumbar radiculopathy [M54.16]   Pre-op diagnosis: Lumbar radiculopathy   Location: [facility] ROOM 18 / [facility]   Surgeons: Onetha Kuba, MD       DISCUSSION: Patient is a 78 year old male scheduled for the above procedure.   History includes never smoker, HTN, hypercholesterolemia, afib, CAD, cardiomyopathy, murmur (mild-moderate MR, moderate AR), ascending TAA, PE, prostate cancer, BPH, CKD, GI bleed, OSA (uses BiPAP.  He had a CCTA on 07/17/2023 that showed CAC 1959 (88th percentile) with 25-49% mid LAD and 50-69% D1 with FFR ct showing no significant stenosis. 07/04/2023 TTE showed LVEF 35-40%, global hypokinesis, mild-moderately dilated LV internal cavity, normal RV systolic function, LA severely dilated, mild-moderate MR, moderate AR, ascending aorta 56 mm. Per 08/11/2023 visit with Dr. Monetta: Although he has multiple cardiovascular problems his atrial fibrillation heart failure CAD LV dysfunction and thoracic aortic aneurysm are stable the patient is that he is optimal and I advise proceeding with his planned neurosurgery with postoperative follow-up in a monitored bed day 1 day 2 and a consult to heart care while in hospital. He advised holding anticoagulation for 3 full doses prior to surgery and may be off up to 1-2 weeks from a bleeding standpoint TBD by neurosurgery. HF felt compensated and CAD and valvular disease felt stable.  Entresto  resumed. 3 month follow-up planned.    He had CT surgery evaluation by Dr. Kerrin on 08/12/2023. By recent CT in July 2025, ascending TAA measures approximately 5.4 to 5.5 cm at the sinuses of Valsalva (tortuous aorta make precise measurement  difficult.). Mid ascending aorta 4.8 cm when measured at the level of the right pulmonary artery, unchanged from 2021. Semiannual follow-up recommended. Of note, he also address TAA in context of upcoming lumbar surgery and wrote, I discussed with Mr. and Mrs. Errington that there is no contraindication to his planned spinal surgery due to his aorta. That does not mean that nothing could happen as there is a known increase in the rate of aneurysmal complications after surgery. However he is in extreme pain and clearly needs to the operation done. As it currently stands is no indication for surgery on his thoracic aorta. Six month follow-up with CTA planned.   He had pulmonology follow-up with Hope Norris, NP on 08/13/2023 for preoperative evaluation. She noted, He has severe sleep apnea and uses a BiPAP machine with settings of 19/15. Despite compliance, he experiences approximately twenty-six apneic events per hour. A titration study from October of the previous year showed severe sleep apnea with fifty-nine apneas per hour. Obstructive apneas were controlled on CPAP of fifteen, but central apneas led to the switch to BiPAP. He has tried different masks, but all have issues with air leaks. She felt he was optimized for surgery from pulmonary standpoint. He will be considered intermediate risk for prolonged mechanical ventilation and/or post op pulmonary complications due to age, type/length of surgery and OSA. Ultimate clearance will be decided upon by surgeon...    Last Xarelto  08/30/2023. Last Farxiga  08/30/2023.   Anesthesia team to evaluate on the day of surgery.     VS: BP (!) 96/54   Pulse 64   Temp 36.7 C   Resp 19  Ht 5' 11 (1.803 m)   Wt 76.2 kg   BMI 23.43 kg/m    PROVIDERS: Fleeta Valeria Mayo, MD is PCP Monetta Rogue, MD is cardiologist Kerrin Standing, MD is CT surgeon   LABS: Most recent labs in [facility] include: Lab Results  Component Value Date   WBC 7.3 08/29/2023    HGB 13.5 08/29/2023   HCT 42.3 08/29/2023   PLT 232 08/29/2023   GLUCOSE 99 08/04/2023   CHOL 126 08/04/2023   TRIG 120 08/04/2023   HDL 39 (L) 08/04/2023   LDLCALC 65 08/04/2023   ALT 12 08/04/2023   AST 16 08/04/2023   NA 142 08/04/2023   K 3.9 08/04/2023   CL 99 08/04/2023   CREATININE 1.27 08/04/2023   BUN 18 08/04/2023   CO2 26 08/04/2023   TSH 1.400 06/14/2022   INR 1.4 (H) 05/22/2023   HGBA1C 6.0 (H) 12/11/2022    Split Night Sleep Study with BiPAP 10/22/2022: IMPRESSIONS - Severe obstructive sleep apnea occurred during the diagnostic portion of the study (AHI = 59.8 /hour). - Obstructive apneas were controlled by CPAP 15, however persistent central apneas led change to BIPAP titration. - An optimal BiPAP pressure was selected for this patient ( 19 / 15 cm of water). - Mild central sleep apnea occurred during the diagnostic portion of the study (CAI = 5.5/hour). - Moderate oxygen desaturation was noted during the diagnostic portion of the study (Min O2 = 75.0%). On BIPAP 19/15, minimum O2 saturation was 88%, Mean 92.6%. - The patient snored with moderate snoring volume during the diagnostic portion of the study. - EKG findings include PVCs. - Clinically significant periodic limb movements of sleep did not occur during the study. - Sleep talking noted. - Sleep remained fragmented throughout with frequent breif arousals and awakening.     RECOMMENDATIONS - Trial of BiPAP therapy on 19/15 cm H2O. - Patient used a Large size Fisher[employer]kel Full Face Simplus mask and heated humidification...    IMAGES: CTA Chest 07/24/2023: Aortic Root: --Valve: 3.2 x 2.6 cm --Sinuses: 5.3 x 4.7 x 4.7 cm --Sinotubular Junction: 5.5 x 5.5 cm   Thoracic Aorta: --Ascending Aorta: 5.4 x 5.3 cm --Aortic Arch: 3.9 x 3.8 cm --Descending Aorta: 3.4 x 3.4 cm  IMPRESSION: 1. Aneurysmal dilatation of the sino-tubular junction and ascending thoracic aorta, unchanged. 2. Similar marked  multichamber cardiomegaly. 3. Subsegmental mucous plugging in the right lower lobe, which may reflect bronchitis or aspiration. 4. Aortic Atherosclerosis (ICD10-I70.0). Coronary artery calcifications. Assessment for potential risk factor modification, dietary therapy or pharmacologic therapy may be warranted, if clinically indicated.    CT Chest 07/16/2023: IMPRESSION: 1. Similar appearing fusiform ascending thoracic aortic aneurysm measuring up to 5.3 cm. Ascending thoracic aortic aneurysm. Recommend semi-annual imaging followup by CTA or MRA and referral to cardiothoracic surgery if not already obtained. This recommendation follows 2010 ACCF/AHA/AATS/ACR/ASA/SCA/SCAI/SIR/STS/SVM Guidelines for the Diagnosis and Management of Patients With Thoracic Aortic Disease. Circulation. 2010; 121: Z733-z630. Aortic aneurysm NOS (ICD10-I71.9) 2. Coronary and aortic atherosclerosis (ICD10-I70.0).   EKG: 08/11/2023: Atrial fibrillation Minimal voltage criteria for LVH, may be normal variant ( R in aVL ) Nonspecific ST and T wave abnormality When compared with ECG of 01-Jul-2023 16:06, No significant change was found Confirmed by Monetta Rogue ([id number]) on 08/11/2023 4:42:30 PM   CV: CTA Coronary 07/17/2023: - Aorta: The proximal ascending aorta is anuerysm (52 mm) in size. No calcifications. No dissection. - Aortic Valve:  Trileaflet.  No calcifications. - Coronary Arteries:  Normal coronary  origin. Right Dominance. - RCA is a large dominant artery that gives rise to PDA and PLA. The RCA is a torturous artery. In the Proximal RCA there is a mild focal calcification. Mid RCA in a bend there appears to be a mild soft plaque with minimal diffuse calcifications. Distal RCA with two separate minimal mixed plaques. - Left main is a large artery that gives rise to LAD and LCX arteries. Minimal calcification in the mid left main vessel. - LAD is a large vessel. There is a focal mild calcified plaque  in the ostial LAD. There is diffuse minimal (<24%) calcified plaque in the proximal LAD. The mid LAD with mild (25-49%) diffuse calcifications right below is a mid soft plaque. The distal LAD with minimal focal plaques. - First diagonal branch is a medium sized vessel with moderate (50-69%) soft plaque in the proximal portion of the vessel. - LCX is a non-dominant artery that gives rise to one large OM1 branch. Diffuse minimal calcifications throughout the LCX artery. - First obtuse marginal with mild calcified plaque in the proximal portion of the vessel.  - Coronary Calcium  Score: Left main: 29.3 Left anterior descending artery: 918 Left circumflex artery: 391 Right coronary artery: 621 Total: 1959 Percentile: 88 - Other findings: Normal pulmonary vein drainage into the left atrium. Normal left atrial appendage without a thrombus. Normal size of the pulmonary artery.   IMPRESSION: 1. Coronary calcium  score of 1959. This was 58 percentile for age and sex matched control. 2. Normal coronary origin with right dominance. 3. CAD-RADS 3. Moderate stenosis. Consider symptom-guided anti-ischemic pharmacotherapy as well as risk factor modification per guideline directed care. Additional analysis with CT FFR . 4. Total plaque volume 2015 mm3 which is 94th percentile for age- and, sex-matched controls (calcified plaque 441 mm3; non-calcified plaque 1574 mm3, Low attenuation 8 mm3). TPV is extensive.    CT FFR 07/17/2023: IMPRESSION: 1.  CT FFR analysis showed no significant stenosis. RECOMMENDATIONS: Guideline-directed medical therapy and aggressive risk factor modification for secondary prevention of coronary artery disease.   Echo 07/04/2023: IMPRESSIONS   1. Left ventricular ejection fraction, by estimation, is 35 to 40%. Left  ventricular ejection fraction by 2D MOD biplane is 36.4 %. The left  ventricle has moderately decreased function. The left ventricle  demonstrates  global hypokinesis. The left  ventricular internal cavity size was mildly to moderately dilated. Left  ventricular diastolic parameters are indeterminate.   2. Right ventricular systolic function is normal. The right ventricular  size is normal.   3. Left atrial size was severely dilated.   4. Right atrial size was mild to moderately dilated.   5. The mitral valve is normal in structure. Mild to moderate mitral valve  regurgitation.   6. The aortic valve is tricuspid. Aortic valve regurgitation is moderate.   7. Aortic dilatation noted. There is severe dilatation of the ascending  aorta, measuring 56 mm.    Nuclear stress test 10/27/2017: IMPRESSIONS   1. Left ventricular ejection fraction, by estimation, is 35 to 40%. Left  ventricular ejection fraction by 2D MOD biplane is 36.4 %. The left  ventricle has moderately decreased function. The left ventricle  demonstrates global hypokinesis. The left  ventricular internal cavity size was mildly to moderately dilated. Left  ventricular diastolic parameters are indeterminate.   2. Right ventricular systolic function is normal. The right ventricular  size is normal.   3. Left atrial size was severely dilated.   4. Right atrial size was mild  to moderately dilated.   5. The mitral valve is normal in structure. Mild to moderate mitral valve  regurgitation.   6. The aortic valve is tricuspid. Aortic valve regurgitation is moderate.   7. Aortic dilatation noted. There is severe dilatation of the ascending  aorta, measuring 56 mm.     Past Medical History:  Diagnosis Date   Aneurysm of aortic root    Overview:  Last Assessment & Plan:  Planning to see Dr Army for evaluation of 5cm aneurysm.  - will check PFT in prep for possible procedure / SGY   Aortic atherosclerosis (HCC) 06/14/2022   Aortic regurgitation    Aortic valve insufficiency    Ascending aortic aneurysm (HCC) 09/11/2016   Atherosclerotic vascular disease 05/27/2022    Atrial fibrillation (HCC)    Atrial fibrillation with RVR (HCC) 07/05/2022   BMI 27.0-27.9,adult 06/14/2022   BPH (benign prostatic hyperplasia)    BRBPR (bright red blood per rectum) 05/20/2023   Cardiomyopathy (HCC) 09/11/2016   CKD (chronic kidney disease) stage 2, GFR 60-89 ml/min 11/23/2021   Closed fracture of right hip (HCC) 03/10/2023   Coronary artery calcification seen on CT scan 10/13/2017   Dysrhythmia    Essential hypertension 06/14/2022   Gastrointestinal hemorrhage 05/20/2023   Heart murmur    High risk medication use 07/31/2022   History of cardiomyopathy 11/09/2021   History of pulmonary embolism 09/24/2012   Overview:  Last Assessment & Plan:  Hx PE x 2 per notes, ? 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  - needs auto-titration study by [employer] or local company, data to RB to adjust his home device.    OSA (obstructive sleep apnea) 09/13/2022   Prediabetes    Preoperative clearance 10/13/2017   Primary osteoarthritis involving multiple joints 06/14/2022   Prostate cancer metastatic to bone (HCC) 06/14/2022   Pulmonary embolism (HCC)    Rhinitis    Sacral wound 04/11/2023   Seasonal allergies 06/14/2022   Spondylolisthesis of lumbar region 12/12/2021   Stage 3a chronic kidney disease (HCC) 06/14/2022   Unstageable pressure ulcer of sacral region (HCC) 03/10/2023    Past Surgical History:   Procedure Laterality Date   APPENDECTOMY     COLONOSCOPY N/A 05/23/2023   Procedure: COLONOSCOPY;  Surgeon: Elicia Claw, MD;  Location: [facility];  Service: Gastroenterology;  Laterality: N/A;   HERNIA REPAIR     LUMBAR LAMINECTOMY  02/06/2022   Facetectom & foraminotomy for decompression of the cauda equina & nerve root L4-5, Arley PHEBE Dark, MD   TONSILLECTOMY      MEDICATIONS:  acetaminophen  (TYLENOL ) 500 MG tablet   Cyanocobalamin  (B-12) 5000 MCG CAPS   FARXIGA  5 MG TABS tablet   KLOR-CON  M20 20 MEQ tablet   methocarbamol  (ROBAXIN ) 500 MG tablet   metoprolol  tartrate (LOPRESSOR ) 50 MG tablet   Multiple Vitamin (MULTIVITAMIN WITH MINERALS) TABS tablet   polyethylene glycol (MIRALAX  / GLYCOLAX ) 17 g packet  rivaroxaban  (XARELTO ) 20 MG TABS tablet   rosuvastatin  (CRESTOR ) 5 MG tablet   sacubitril -valsartan  (ENTRESTO ) 24-26 MG   tamsulosin  (FLOMAX ) 0.4 MG CAPS capsule   torsemide  (DEMADEX ) 20 MG tablet   traMADol  (ULTRAM ) 50 MG tablet   No current facility-administered medications for this encounter.     Isaiah Ruder, PA-C Surgical Short Stay/Anesthesiology [facility] Phone [phone] [facility] Phone [phone] 09/01/2023 7:34 PM

## 2023-09-02 ENCOUNTER — Encounter (HOSPITAL_COMMUNITY): Payer: Self-pay

## 2023-09-03 ENCOUNTER — Encounter (HOSPITAL_COMMUNITY)
Admission: RE | Disposition: A | Discharge status: Rehabilitation Facility | Payer: Self-pay | Source: Home / Self Care | Attending: Internal Medicine

## 2023-09-03 ENCOUNTER — Inpatient Hospital Stay (HOSPITAL_COMMUNITY)
Admission: RE | Admit: 2023-09-03 | Discharge: 2023-09-10 | DRG: 456 | Disposition: A | Discharge status: Rehabilitation Facility | Attending: Internal Medicine | Admitting: Internal Medicine

## 2023-09-03 ENCOUNTER — Inpatient Hospital Stay (HOSPITAL_COMMUNITY): Payer: Self-pay | Admitting: Vascular Surgery

## 2023-09-03 ENCOUNTER — Inpatient Hospital Stay (HOSPITAL_COMMUNITY)

## 2023-09-03 ENCOUNTER — Encounter (HOSPITAL_COMMUNITY): Payer: Self-pay | Admitting: Neurosurgery

## 2023-09-03 ENCOUNTER — Other Ambulatory Visit: Payer: Self-pay

## 2023-09-03 DIAGNOSIS — M21371 Foot drop, right foot: Secondary | ICD-10-CM | POA: Diagnosis present

## 2023-09-03 DIAGNOSIS — I959 Hypotension, unspecified: Secondary | ICD-10-CM | POA: Diagnosis not present

## 2023-09-03 DIAGNOSIS — M85871 Other specified disorders of bone density and structure, right ankle and foot: Secondary | ICD-10-CM | POA: Diagnosis not present

## 2023-09-03 DIAGNOSIS — M5416 Radiculopathy, lumbar region: Secondary | ICD-10-CM | POA: Diagnosis present

## 2023-09-03 DIAGNOSIS — S32012A Unstable burst fracture of first lumbar vertebra, initial encounter for closed fracture: Secondary | ICD-10-CM

## 2023-09-03 DIAGNOSIS — I509 Heart failure, unspecified: Secondary | ICD-10-CM | POA: Diagnosis not present

## 2023-09-03 DIAGNOSIS — K5901 Slow transit constipation: Secondary | ICD-10-CM | POA: Diagnosis not present

## 2023-09-03 DIAGNOSIS — I7121 Aneurysm of the ascending aorta, without rupture: Secondary | ICD-10-CM | POA: Diagnosis present

## 2023-09-03 DIAGNOSIS — Z79899 Other long term (current) drug therapy: Secondary | ICD-10-CM

## 2023-09-03 DIAGNOSIS — Z751 Person awaiting admission to adequate facility elsewhere: Secondary | ICD-10-CM

## 2023-09-03 DIAGNOSIS — Z833 Family history of diabetes mellitus: Secondary | ICD-10-CM

## 2023-09-03 DIAGNOSIS — E785 Hyperlipidemia, unspecified: Secondary | ICD-10-CM | POA: Diagnosis present

## 2023-09-03 DIAGNOSIS — Z7984 Long term (current) use of oral hypoglycemic drugs: Secondary | ICD-10-CM

## 2023-09-03 DIAGNOSIS — I2510 Atherosclerotic heart disease of native coronary artery without angina pectoris: Secondary | ICD-10-CM | POA: Diagnosis not present

## 2023-09-03 DIAGNOSIS — C7951 Secondary malignant neoplasm of bone: Secondary | ICD-10-CM | POA: Diagnosis not present

## 2023-09-03 DIAGNOSIS — I428 Other cardiomyopathies: Secondary | ICD-10-CM | POA: Diagnosis present

## 2023-09-03 DIAGNOSIS — I4821 Permanent atrial fibrillation: Secondary | ICD-10-CM | POA: Diagnosis present

## 2023-09-03 DIAGNOSIS — R570 Cardiogenic shock: Secondary | ICD-10-CM | POA: Diagnosis not present

## 2023-09-03 DIAGNOSIS — Z8249 Family history of ischemic heart disease and other diseases of the circulatory system: Secondary | ICD-10-CM | POA: Diagnosis not present

## 2023-09-03 DIAGNOSIS — I10 Essential (primary) hypertension: Secondary | ICD-10-CM | POA: Diagnosis not present

## 2023-09-03 DIAGNOSIS — I130 Hypertensive heart and chronic kidney disease with heart failure and stage 1 through stage 4 chronic kidney disease, or unspecified chronic kidney disease: Secondary | ICD-10-CM | POA: Diagnosis not present

## 2023-09-03 DIAGNOSIS — D72823 Leukemoid reaction: Secondary | ICD-10-CM | POA: Diagnosis not present

## 2023-09-03 DIAGNOSIS — E860 Dehydration: Secondary | ICD-10-CM | POA: Diagnosis not present

## 2023-09-03 DIAGNOSIS — I471 Supraventricular tachycardia, unspecified: Secondary | ICD-10-CM | POA: Diagnosis not present

## 2023-09-03 DIAGNOSIS — T8130XA Disruption of wound, unspecified, initial encounter: Secondary | ICD-10-CM | POA: Diagnosis not present

## 2023-09-03 DIAGNOSIS — Z881 Allergy status to other antibiotic agents status: Secondary | ICD-10-CM | POA: Diagnosis not present

## 2023-09-03 DIAGNOSIS — Z4789 Encounter for other orthopedic aftercare: Secondary | ICD-10-CM | POA: Diagnosis not present

## 2023-09-03 DIAGNOSIS — I5020 Unspecified systolic (congestive) heart failure: Secondary | ICD-10-CM | POA: Diagnosis not present

## 2023-09-03 DIAGNOSIS — D62 Acute posthemorrhagic anemia: Secondary | ICD-10-CM | POA: Diagnosis not present

## 2023-09-03 DIAGNOSIS — G8929 Other chronic pain: Secondary | ICD-10-CM | POA: Diagnosis not present

## 2023-09-03 DIAGNOSIS — G9520 Unspecified cord compression: Secondary | ICD-10-CM | POA: Diagnosis present

## 2023-09-03 DIAGNOSIS — Y793 Surgical instruments, materials and orthopedic devices (including sutures) associated with adverse incidents: Secondary | ICD-10-CM | POA: Diagnosis not present

## 2023-09-03 DIAGNOSIS — S34101A Unspecified injury to L1 level of lumbar spinal cord, initial encounter: Secondary | ICD-10-CM | POA: Diagnosis present

## 2023-09-03 DIAGNOSIS — E7800 Pure hypercholesterolemia, unspecified: Secondary | ICD-10-CM | POA: Diagnosis not present

## 2023-09-03 DIAGNOSIS — D6869 Other thrombophilia: Secondary | ICD-10-CM | POA: Diagnosis not present

## 2023-09-03 DIAGNOSIS — N400 Enlarged prostate without lower urinary tract symptoms: Secondary | ICD-10-CM | POA: Diagnosis not present

## 2023-09-03 DIAGNOSIS — T8110XA Postprocedural shock unspecified, initial encounter: Secondary | ICD-10-CM | POA: Diagnosis not present

## 2023-09-03 DIAGNOSIS — Z820 Family history of epilepsy and other diseases of the nervous system: Secondary | ICD-10-CM

## 2023-09-03 DIAGNOSIS — M2011 Hallux valgus (acquired), right foot: Secondary | ICD-10-CM | POA: Diagnosis not present

## 2023-09-03 DIAGNOSIS — Z823 Family history of stroke: Secondary | ICD-10-CM

## 2023-09-03 DIAGNOSIS — Z981 Arthrodesis status: Secondary | ICD-10-CM | POA: Diagnosis not present

## 2023-09-03 DIAGNOSIS — E669 Obesity, unspecified: Secondary | ICD-10-CM | POA: Diagnosis present

## 2023-09-03 DIAGNOSIS — Y838 Other surgical procedures as the cause of abnormal reaction of the patient, or of later complication, without mention of misadventure at the time of the procedure: Secondary | ICD-10-CM | POA: Diagnosis not present

## 2023-09-03 DIAGNOSIS — S34101D Unspecified injury to L1 level of lumbar spinal cord, subsequent encounter: Secondary | ICD-10-CM | POA: Diagnosis not present

## 2023-09-03 DIAGNOSIS — M545 Low back pain, unspecified: Secondary | ICD-10-CM | POA: Diagnosis not present

## 2023-09-03 DIAGNOSIS — I351 Nonrheumatic aortic (valve) insufficiency: Secondary | ICD-10-CM | POA: Diagnosis not present

## 2023-09-03 DIAGNOSIS — F54 Psychological and behavioral factors associated with disorders or diseases classified elsewhere: Secondary | ICD-10-CM | POA: Diagnosis not present

## 2023-09-03 DIAGNOSIS — R7303 Prediabetes: Secondary | ICD-10-CM | POA: Diagnosis present

## 2023-09-03 DIAGNOSIS — I4891 Unspecified atrial fibrillation: Secondary | ICD-10-CM | POA: Diagnosis not present

## 2023-09-03 DIAGNOSIS — S72001D Fracture of unspecified part of neck of right femur, subsequent encounter for closed fracture with routine healing: Secondary | ICD-10-CM

## 2023-09-03 DIAGNOSIS — I080 Rheumatic disorders of both mitral and aortic valves: Secondary | ICD-10-CM | POA: Diagnosis present

## 2023-09-03 DIAGNOSIS — I4819 Other persistent atrial fibrillation: Secondary | ICD-10-CM

## 2023-09-03 DIAGNOSIS — N182 Chronic kidney disease, stage 2 (mild): Secondary | ICD-10-CM | POA: Diagnosis present

## 2023-09-03 DIAGNOSIS — Z7901 Long term (current) use of anticoagulants: Secondary | ICD-10-CM

## 2023-09-03 DIAGNOSIS — I4719 Other supraventricular tachycardia: Secondary | ICD-10-CM

## 2023-09-03 DIAGNOSIS — I480 Paroxysmal atrial fibrillation: Secondary | ICD-10-CM | POA: Diagnosis not present

## 2023-09-03 DIAGNOSIS — I25810 Atherosclerosis of coronary artery bypass graft(s) without angina pectoris: Secondary | ICD-10-CM | POA: Diagnosis not present

## 2023-09-03 DIAGNOSIS — I5022 Chronic systolic (congestive) heart failure: Secondary | ICD-10-CM | POA: Diagnosis not present

## 2023-09-03 DIAGNOSIS — Z8679 Personal history of other diseases of the circulatory system: Secondary | ICD-10-CM

## 2023-09-03 DIAGNOSIS — G4733 Obstructive sleep apnea (adult) (pediatric): Secondary | ICD-10-CM | POA: Diagnosis present

## 2023-09-03 DIAGNOSIS — N1831 Chronic kidney disease, stage 3a: Secondary | ICD-10-CM | POA: Diagnosis present

## 2023-09-03 DIAGNOSIS — K5900 Constipation, unspecified: Secondary | ICD-10-CM | POA: Diagnosis not present

## 2023-09-03 DIAGNOSIS — L89610 Pressure ulcer of right heel, unstageable: Secondary | ICD-10-CM | POA: Diagnosis not present

## 2023-09-03 DIAGNOSIS — S32001A Stable burst fracture of unspecified lumbar vertebra, initial encounter for closed fracture: Principal | ICD-10-CM | POA: Diagnosis present

## 2023-09-03 DIAGNOSIS — M40205 Unspecified kyphosis, thoracolumbar region: Secondary | ICD-10-CM | POA: Diagnosis present

## 2023-09-03 DIAGNOSIS — S32001S Stable burst fracture of unspecified lumbar vertebra, sequela: Secondary | ICD-10-CM | POA: Diagnosis not present

## 2023-09-03 DIAGNOSIS — R579 Shock, unspecified: Secondary | ICD-10-CM | POA: Diagnosis not present

## 2023-09-03 DIAGNOSIS — G9529 Other cord compression: Secondary | ICD-10-CM | POA: Diagnosis not present

## 2023-09-03 DIAGNOSIS — G4731 Primary central sleep apnea: Secondary | ICD-10-CM | POA: Diagnosis present

## 2023-09-03 DIAGNOSIS — C61 Malignant neoplasm of prostate: Secondary | ICD-10-CM | POA: Diagnosis present

## 2023-09-03 DIAGNOSIS — I472 Ventricular tachycardia, unspecified: Secondary | ICD-10-CM | POA: Diagnosis not present

## 2023-09-03 DIAGNOSIS — G8222 Paraplegia, incomplete: Secondary | ICD-10-CM | POA: Diagnosis not present

## 2023-09-03 DIAGNOSIS — S32011A Stable burst fracture of first lumbar vertebra, initial encounter for closed fracture: Principal | ICD-10-CM | POA: Diagnosis present

## 2023-09-03 DIAGNOSIS — Z8546 Personal history of malignant neoplasm of prostate: Secondary | ICD-10-CM

## 2023-09-03 DIAGNOSIS — N183 Chronic kidney disease, stage 3 unspecified: Secondary | ICD-10-CM | POA: Diagnosis not present

## 2023-09-03 DIAGNOSIS — N179 Acute kidney failure, unspecified: Secondary | ICD-10-CM | POA: Diagnosis not present

## 2023-09-03 DIAGNOSIS — D649 Anemia, unspecified: Secondary | ICD-10-CM | POA: Diagnosis not present

## 2023-09-03 DIAGNOSIS — Z0189 Encounter for other specified special examinations: Secondary | ICD-10-CM | POA: Diagnosis not present

## 2023-09-03 DIAGNOSIS — I700 Atherosclerosis of aorta: Secondary | ICD-10-CM | POA: Diagnosis present

## 2023-09-03 DIAGNOSIS — I493 Ventricular premature depolarization: Secondary | ICD-10-CM | POA: Diagnosis not present

## 2023-09-03 DIAGNOSIS — G4730 Sleep apnea, unspecified: Secondary | ICD-10-CM | POA: Diagnosis not present

## 2023-09-03 DIAGNOSIS — M159 Polyosteoarthritis, unspecified: Secondary | ICD-10-CM | POA: Diagnosis present

## 2023-09-03 HISTORY — PX: LAMINECTOMY WITH POSTERIOR LATERAL ARTHRODESIS LEVEL 4: SHX6338

## 2023-09-03 LAB — HEMOGLOBIN AND HEMATOCRIT, BLOOD
HCT: 34 % — ABNORMAL LOW (ref 39.0–52.0)
Hemoglobin: 11.2 g/dL — ABNORMAL LOW (ref 13.0–17.0)

## 2023-09-03 LAB — PHOSPHORUS: Phosphorus: 4.1 mg/dL (ref 2.5–4.6)

## 2023-09-03 LAB — MAGNESIUM: Magnesium: 1.8 mg/dL (ref 1.7–2.4)

## 2023-09-03 LAB — BASIC METABOLIC PANEL WITH GFR
Anion gap: 14 (ref 5–15)
BUN: 20 mg/dL (ref 8–23)
CO2: 20 mmol/L — ABNORMAL LOW (ref 22–32)
Calcium: 8.7 mg/dL — ABNORMAL LOW (ref 8.9–10.3)
Chloride: 103 mmol/L (ref 98–111)
Creatinine, Ser: 1.23 mg/dL (ref 0.61–1.24)
GFR, Estimated: >60 mL/min (ref >60)
Glucose, Bld: 179 mg/dL — ABNORMAL HIGH (ref 70–99)
Potassium: 3.9 mmol/L (ref 3.5–5.1)
Sodium: 137 mmol/L (ref 135–145)

## 2023-09-03 LAB — TROPONIN I (HIGH SENSITIVITY): Troponin I (High Sensitivity): 15 ng/L (ref <18)

## 2023-09-03 SURGERY — LAMINECTOMY WITH POSTERIOR LATERAL ARTHRODESIS LEVEL 4
Anesthesia: General | Site: Back

## 2023-09-03 MED ORDER — HYDROMORPHONE HCL 1 MG/ML IJ SOLN
INTRAMUSCULAR | Status: AC
  Filled 2023-09-03: qty 1

## 2023-09-03 MED ORDER — DAPAGLIFLOZIN PROPANEDIOL 5 MG PO TABS
5.0000 mg | ORAL_TABLET | Freq: Every day | ORAL | Status: DC
  Administered 2023-09-03 – 2023-09-05 (×3): 5 mg via ORAL
  Filled 2023-09-03 (×3): qty 1

## 2023-09-03 MED ORDER — ROSUVASTATIN CALCIUM 5 MG PO TABS
5.0000 mg | ORAL_TABLET | Freq: Every day | ORAL | Status: DC
  Administered 2023-09-03 – 2023-09-05 (×3): 5 mg via ORAL
  Filled 2023-09-03 (×3): qty 1

## 2023-09-03 MED ORDER — ALBUMIN HUMAN 5 % IV SOLN: INTRAVENOUS | Status: DC | PRN

## 2023-09-03 MED ORDER — PHENYLEPHRINE 80 MCG/ML (10ML) SYRINGE FOR IV PUSH (FOR BLOOD PRESSURE SUPPORT)
PREFILLED_SYRINGE | INTRAVENOUS | Status: DC | PRN
  Administered 2023-09-03 (×2): 80 ug via INTRAVENOUS
  Administered 2023-09-03 (×2): 160 ug via INTRAVENOUS
  Administered 2023-09-03 (×2): 80 ug via INTRAVENOUS
  Administered 2023-09-03: 160 ug via INTRAVENOUS
  Administered 2023-09-03: 80 ug via INTRAVENOUS
  Administered 2023-09-03: 240 ug via INTRAVENOUS

## 2023-09-03 MED ORDER — SODIUM CHLORIDE 0.9% FLUSH
3.0000 mL | Freq: Two times a day (BID) | INTRAVENOUS | Status: DC
  Administered 2023-09-03 – 2023-09-10 (×11): 3 mL via INTRAVENOUS

## 2023-09-03 MED ORDER — LIDOCAINE 2% (20 MG/ML) 5 ML SYRINGE
INTRAMUSCULAR | Status: AC
  Filled 2023-09-03: qty 5

## 2023-09-03 MED ORDER — FENTANYL CITRATE (PF) 250 MCG/5ML IJ SOLN
INTRAMUSCULAR | Status: AC
  Filled 2023-09-03: qty 5

## 2023-09-03 MED ORDER — BUPIVACAINE LIPOSOME 1.3 % IJ SUSP
INTRAMUSCULAR | Status: AC
  Filled 2023-09-03: qty 20

## 2023-09-03 MED ORDER — DEXAMETHASONE SODIUM PHOSPHATE 10 MG/ML IJ SOLN
INTRAMUSCULAR | Status: AC
Start: 2023-09-03 — End: 2023-09-03
  Filled 2023-09-03: qty 1

## 2023-09-03 MED ORDER — LIDOCAINE-EPINEPHRINE 1 %-1:100000 IJ SOLN
INTRAMUSCULAR | Status: AC
Start: 2023-09-03 — End: 2023-09-03
  Filled 2023-09-03: qty 1

## 2023-09-03 MED ORDER — TRAMADOL HCL 50 MG PO TABS
100.0000 mg | ORAL_TABLET | Freq: Three times a day (TID) | ORAL | Status: DC | PRN
  Administered 2023-09-04 – 2023-09-10 (×9): 100 mg via ORAL
  Filled 2023-09-03 (×9): qty 2

## 2023-09-03 MED ORDER — PHENOL 1.4 % MT LIQD: 1.0000 | OROMUCOSAL | Status: DC | PRN

## 2023-09-03 MED ORDER — THROMBIN 20000 UNITS EX SOLR
CUTANEOUS | Status: DC | PRN
  Administered 2023-09-03: 20 mL via TOPICAL

## 2023-09-03 MED ORDER — TORSEMIDE 20 MG PO TABS
20.0000 mg | ORAL_TABLET | Freq: Two times a day (BID) | ORAL | Status: DC
  Administered 2023-09-03 – 2023-09-05 (×5): 20 mg via ORAL
  Filled 2023-09-03 (×5): qty 1

## 2023-09-03 MED ORDER — CEFAZOLIN SODIUM-DEXTROSE 2-4 GM/100ML-% IV SOLN
2.0000 g | INTRAVENOUS | Status: AC
  Administered 2023-09-03: 2 g via INTRAVENOUS
  Filled 2023-09-03: qty 100

## 2023-09-03 MED ORDER — METHOCARBAMOL 500 MG PO TABS
500.0000 mg | ORAL_TABLET | Freq: Four times a day (QID) | ORAL | Status: DC
  Administered 2023-09-03 – 2023-09-10 (×28): 500 mg via ORAL
  Filled 2023-09-03 (×28): qty 1

## 2023-09-03 MED ORDER — FENTANYL CITRATE (PF) 250 MCG/5ML IJ SOLN
INTRAMUSCULAR | Status: DC | PRN
  Administered 2023-09-03 (×5): 50 ug via INTRAVENOUS

## 2023-09-03 MED ORDER — METOPROLOL TARTRATE 5 MG/5ML IV SOLN
INTRAVENOUS | Status: DC | PRN
  Administered 2023-09-03 (×2): 5 mg via INTRAVENOUS

## 2023-09-03 MED ORDER — ONDANSETRON HCL 4 MG/2ML IJ SOLN
INTRAMUSCULAR | Status: DC | PRN
  Administered 2023-09-03: 4 mg via INTRAVENOUS

## 2023-09-03 MED ORDER — VITAMIN B-12 1000 MCG PO TABS
5000.0000 ug | ORAL_TABLET | Freq: Every day | ORAL | Status: DC
  Administered 2023-09-04 – 2023-09-10 (×7): 5000 ug via ORAL
  Filled 2023-09-03 (×7): qty 5

## 2023-09-03 MED ORDER — ONDANSETRON HCL 4 MG PO TABS
4.0000 mg | ORAL_TABLET | Freq: Four times a day (QID) | ORAL | Status: DC | PRN
  Administered 2023-09-05: 4 mg via ORAL
  Filled 2023-09-03: qty 1

## 2023-09-03 MED ORDER — AMISULPRIDE (ANTIEMETIC) 5 MG/2ML IV SOLN: 10.0000 mg | Freq: Once | INTRAVENOUS | Status: DC | PRN

## 2023-09-03 MED ORDER — ALUM & MAG HYDROXIDE-SIMETH 200-200-20 MG/5ML PO SUSP: 30.0000 mL | Freq: Four times a day (QID) | ORAL | Status: DC | PRN

## 2023-09-03 MED ORDER — CEFAZOLIN SODIUM-DEXTROSE 2-4 GM/100ML-% IV SOLN
2.0000 g | Freq: Three times a day (TID) | INTRAVENOUS | Status: AC
  Administered 2023-09-03 – 2023-09-05 (×6): 2 g via INTRAVENOUS
  Filled 2023-09-03 (×6): qty 100

## 2023-09-03 MED ORDER — AMIODARONE HCL IN DEXTROSE 360-4.14 MG/200ML-% IV SOLN
60.0000 mg/h | INTRAVENOUS | Status: AC
  Administered 2023-09-03 (×2): 60 mg/h via INTRAVENOUS
  Filled 2023-09-03 (×2): qty 200

## 2023-09-03 MED ORDER — CHLORHEXIDINE GLUCONATE CLOTH 2 % EX PADS: 6.0000 | MEDICATED_PAD | Freq: Once | CUTANEOUS | Status: DC

## 2023-09-03 MED ORDER — LIDOCAINE-EPINEPHRINE 1 %-1:100000 IJ SOLN
INTRAMUSCULAR | Status: DC | PRN
  Administered 2023-09-03: 10 mL

## 2023-09-03 MED ORDER — OXYCODONE HCL 5 MG PO TABS: 5.0000 mg | ORAL_TABLET | Freq: Once | ORAL | Status: DC | PRN

## 2023-09-03 MED ORDER — PANTOPRAZOLE SODIUM 40 MG IV SOLR
40.0000 mg | Freq: Every day | INTRAVENOUS | Status: DC
  Administered 2023-09-03 – 2023-09-09 (×7): 40 mg via INTRAVENOUS
  Filled 2023-09-03 (×7): qty 10

## 2023-09-03 MED ORDER — DEXAMETHASONE SODIUM PHOSPHATE 10 MG/ML IJ SOLN
INTRAMUSCULAR | Status: DC | PRN
  Administered 2023-09-03: 10 mg via INTRAVENOUS

## 2023-09-03 MED ORDER — OXYCODONE HCL 5 MG/5ML PO SOLN: 5.0000 mg | Freq: Once | ORAL | Status: DC | PRN

## 2023-09-03 MED ORDER — MIDAZOLAM HCL 2 MG/2ML IJ SOLN
INTRAMUSCULAR | Status: AC
Start: 2023-09-03 — End: 2023-09-03
  Filled 2023-09-03: qty 2

## 2023-09-03 MED ORDER — LIDOCAINE 2% (20 MG/ML) 5 ML SYRINGE
INTRAMUSCULAR | Status: DC | PRN
  Administered 2023-09-03: 60 mg via INTRAVENOUS

## 2023-09-03 MED ORDER — HYDROMORPHONE HCL 1 MG/ML IJ SOLN
0.5000 mg | INTRAMUSCULAR | Status: DC | PRN
  Administered 2023-09-03 – 2023-09-08 (×3): 0.5 mg via INTRAVENOUS
  Filled 2023-09-03 (×3): qty 0.5

## 2023-09-03 MED ORDER — ACETAMINOPHEN 650 MG RE SUPP: 650.0000 mg | RECTAL | Status: DC | PRN

## 2023-09-03 MED ORDER — ESMOLOL HCL 100 MG/10ML IV SOLN
INTRAVENOUS | Status: DC | PRN
  Administered 2023-09-03: 10 mg via INTRAVENOUS
  Administered 2023-09-03: 15 mg via INTRAVENOUS

## 2023-09-03 MED ORDER — ORAL CARE MOUTH RINSE: 15.0000 mL | Freq: Once | OROMUCOSAL | Status: AC

## 2023-09-03 MED ORDER — ORAL CARE MOUTH RINSE
15.0000 mL | OROMUCOSAL | Status: DC | PRN
Start: 2023-09-03 — End: 2023-09-10

## 2023-09-03 MED ORDER — DILTIAZEM LOAD VIA INFUSION: INTRAVENOUS | Status: DC | PRN

## 2023-09-03 MED ORDER — ACETAMINOPHEN 500 MG PO TABS: 1000.0000 mg | ORAL_TABLET | Freq: Three times a day (TID) | ORAL | Status: DC | PRN

## 2023-09-03 MED ORDER — CHLORHEXIDINE GLUCONATE 0.12 % MT SOLN
15.0000 mL | Freq: Once | OROMUCOSAL | Status: AC
  Administered 2023-09-03: 15 mL via OROMUCOSAL
  Filled 2023-09-03: qty 15

## 2023-09-03 MED ORDER — 0.9 % SODIUM CHLORIDE (POUR BTL) OPTIME
TOPICAL | Status: DC | PRN
  Administered 2023-09-03 (×2): 1000 mL

## 2023-09-03 MED ORDER — THROMBIN 20000 UNITS EX SOLR
CUTANEOUS | Status: AC
  Filled 2023-09-03: qty 20000

## 2023-09-03 MED ORDER — MENTHOL 3 MG MT LOZG: 1.0000 | LOZENGE | OROMUCOSAL | Status: DC | PRN

## 2023-09-03 MED ORDER — BUPIVACAINE LIPOSOME 1.3 % IJ SUSP
INTRAMUSCULAR | Status: DC | PRN
  Administered 2023-09-03: 20 mL

## 2023-09-03 MED ORDER — HYDROMORPHONE HCL 1 MG/ML IJ SOLN
0.2500 mg | INTRAMUSCULAR | Status: DC | PRN
  Administered 2023-09-03 (×3): 0.25 mg via INTRAVENOUS

## 2023-09-03 MED ORDER — CHLORHEXIDINE GLUCONATE CLOTH 2 % EX PADS
6.0000 | MEDICATED_PAD | Freq: Every day | CUTANEOUS | Status: DC
  Administered 2023-09-03 – 2023-09-10 (×8): 6 via TOPICAL

## 2023-09-03 MED ORDER — ROCURONIUM BROMIDE 10 MG/ML (PF) SYRINGE
PREFILLED_SYRINGE | INTRAVENOUS | Status: DC | PRN
  Administered 2023-09-03: 100 mg via INTRAVENOUS
  Administered 2023-09-03: 20 mg via INTRAVENOUS
  Administered 2023-09-03: 10 mg via INTRAVENOUS

## 2023-09-03 MED ORDER — PHENYLEPHRINE HCL-NACL 20-0.9 MG/250ML-% IV SOLN
INTRAVENOUS | Status: DC | PRN
Start: 2023-09-03 — End: 2023-09-03
  Administered 2023-09-03: 100 ug/min via INTRAVENOUS

## 2023-09-03 MED ORDER — POLYETHYLENE GLYCOL 3350 17 G PO PACK
17.0000 g | PACK | Freq: Every day | ORAL | Status: DC | PRN
  Administered 2023-09-05: 17 g via ORAL
  Filled 2023-09-03: qty 1

## 2023-09-03 MED ORDER — POTASSIUM CHLORIDE CRYS ER 20 MEQ PO TBCR
20.0000 meq | EXTENDED_RELEASE_TABLET | Freq: Every day | ORAL | Status: DC
  Administered 2023-09-03 – 2023-09-10 (×8): 20 meq via ORAL
  Filled 2023-09-03 (×8): qty 1

## 2023-09-03 MED ORDER — PROPOFOL 10 MG/ML IV BOLUS
INTRAVENOUS | Status: DC | PRN
  Administered 2023-09-03: 100 mg via INTRAVENOUS
  Administered 2023-09-03: 50 mg via INTRAVENOUS

## 2023-09-03 MED ORDER — DILTIAZEM HCL-DEXTROSE 125-5 MG/125ML-% IV SOLN (PREMIX)
INTRAVENOUS | Status: DC | PRN
  Administered 2023-09-03: 5 mg/h via INTRAVENOUS

## 2023-09-03 MED ORDER — SODIUM CHLORIDE 0.9% FLUSH: 3.0000 mL | INTRAVENOUS | Status: DC | PRN

## 2023-09-03 MED ORDER — ONDANSETRON HCL 4 MG/2ML IJ SOLN: 4.0000 mg | Freq: Once | INTRAMUSCULAR | Status: DC | PRN

## 2023-09-03 MED ORDER — ONDANSETRON HCL 4 MG/2ML IJ SOLN
INTRAMUSCULAR | Status: AC
  Filled 2023-09-03: qty 2

## 2023-09-03 MED ORDER — METOPROLOL TARTRATE 50 MG PO TABS
50.0000 mg | ORAL_TABLET | Freq: Two times a day (BID) | ORAL | Status: DC
  Administered 2023-09-04: 50 mg via ORAL
  Filled 2023-09-03 (×2): qty 1

## 2023-09-03 MED ORDER — ALBUMIN HUMAN 5 % IV SOLN
INTRAVENOUS | Status: AC
Start: 2023-09-03 — End: 2023-09-03
  Filled 2023-09-03: qty 250

## 2023-09-03 MED ORDER — ADULT MULTIVITAMIN W/MINERALS CH
1.0000 | ORAL_TABLET | Freq: Every day | ORAL | Status: DC
  Administered 2023-09-03 – 2023-09-10 (×8): 1 via ORAL
  Filled 2023-09-03 (×8): qty 1

## 2023-09-03 MED ORDER — ALBUMIN HUMAN 5 % IV SOLN
12.5000 g | Freq: Once | INTRAVENOUS | Status: AC
  Administered 2023-09-03: 12.5 g via INTRAVENOUS

## 2023-09-03 MED ORDER — ACETAMINOPHEN 325 MG PO TABS
650.0000 mg | ORAL_TABLET | ORAL | Status: DC | PRN
  Filled 2023-09-03: qty 2

## 2023-09-03 MED ORDER — SODIUM CHLORIDE 0.9 % IV SOLN: 250.0000 mL | INTRAVENOUS | Status: AC

## 2023-09-03 MED ORDER — NOREPINEPHRINE 4 MG/250ML-% IV SOLN
2.0000 ug/min | INTRAVENOUS | Status: DC
  Administered 2023-09-03: 4 ug/min via INTRAVENOUS
  Administered 2023-09-04: 10 ug/min via INTRAVENOUS
  Administered 2023-09-04: 8 ug/min via INTRAVENOUS
  Administered 2023-09-04 – 2023-09-05 (×2): 10 ug/min via INTRAVENOUS
  Administered 2023-09-05: 9 ug/min via INTRAVENOUS
  Filled 2023-09-03 (×5): qty 250

## 2023-09-03 MED ORDER — TAMSULOSIN HCL 0.4 MG PO CAPS
0.4000 mg | ORAL_CAPSULE | Freq: Every day | ORAL | Status: DC
  Administered 2023-09-04 – 2023-09-10 (×7): 0.4 mg via ORAL
  Filled 2023-09-03 (×7): qty 1

## 2023-09-03 MED ORDER — PROPOFOL 10 MG/ML IV BOLUS
INTRAVENOUS | Status: AC
  Filled 2023-09-03: qty 20

## 2023-09-03 MED ORDER — HYDROCODONE-ACETAMINOPHEN 5-325 MG PO TABS
2.0000 | ORAL_TABLET | ORAL | Status: DC | PRN
  Administered 2023-09-03 – 2023-09-10 (×31): 2 via ORAL
  Filled 2023-09-03 (×31): qty 2

## 2023-09-03 MED ORDER — AMIODARONE HCL IN DEXTROSE 360-4.14 MG/200ML-% IV SOLN
30.0000 mg/h | INTRAVENOUS | Status: AC
  Administered 2023-09-04 (×2): 30 mg/h via INTRAVENOUS
  Filled 2023-09-03 (×3): qty 200

## 2023-09-03 MED ORDER — LACTATED RINGERS IV SOLN: INTRAVENOUS | Status: DC

## 2023-09-03 MED ORDER — EPHEDRINE SULFATE-NACL 50-0.9 MG/10ML-% IV SOSY
PREFILLED_SYRINGE | INTRAVENOUS | Status: DC | PRN
  Administered 2023-09-03: 5 mg via INTRAVENOUS

## 2023-09-03 MED ORDER — ROCURONIUM BROMIDE 10 MG/ML (PF) SYRINGE
PREFILLED_SYRINGE | INTRAVENOUS | Status: AC
  Filled 2023-09-03: qty 10

## 2023-09-03 MED ORDER — ACETAMINOPHEN 500 MG PO TABS
1000.0000 mg | ORAL_TABLET | Freq: Once | ORAL | Status: AC
  Administered 2023-09-03: 1000 mg via ORAL
  Filled 2023-09-03: qty 2

## 2023-09-03 MED ORDER — PHENYLEPHRINE 80 MCG/ML (10ML) SYRINGE FOR IV PUSH (FOR BLOOD PRESSURE SUPPORT)
PREFILLED_SYRINGE | INTRAVENOUS | Status: AC
  Filled 2023-09-03: qty 10

## 2023-09-03 MED ORDER — VASOPRESSIN 20 UNITS/100 ML INFUSION FOR SHOCK
0.0000 [IU]/min | INTRAVENOUS | Status: DC
  Filled 2023-09-03: qty 100

## 2023-09-03 MED ORDER — ONDANSETRON HCL 4 MG/2ML IJ SOLN: 4.0000 mg | Freq: Four times a day (QID) | INTRAMUSCULAR | Status: DC | PRN

## 2023-09-03 MED ORDER — SODIUM CHLORIDE 0.9 % IV SOLN: INTRAVENOUS | Status: DC | PRN

## 2023-09-03 MED ORDER — CYCLOBENZAPRINE HCL 10 MG PO TABS: 10.0000 mg | ORAL_TABLET | Freq: Three times a day (TID) | ORAL | Status: DC | PRN

## 2023-09-03 SURGICAL SUPPLY — 62 items
BAG COUNTER SPONGE SURGICOUNT (BAG) ×1 IMPLANT
BENZOIN TINCTURE PRP APPL 2/3 (GAUZE/BANDAGES/DRESSINGS) ×1 IMPLANT
BLADE BONE MILL MEDIUM (MISCELLANEOUS) IMPLANT
BLADE CLIPPER SURG (BLADE) IMPLANT
BLADE SURG 11 STRL SS (BLADE) ×1 IMPLANT
BUR MATCHSTICK NEURO 3.0 LAGG (BURR) ×1 IMPLANT
BUR PRECISION FLUTE 6.0 (BURR) ×1 IMPLANT
CANISTER SUCTION 3000ML PPV (SUCTIONS) ×1 IMPLANT
CNTNR URN SCR LID CUP LEK RST (MISCELLANEOUS) ×1 IMPLANT
COVER BACK TABLE 24X17X13 BIG (DRAPES) IMPLANT
COVER BACK TABLE 60X90IN (DRAPES) ×1 IMPLANT
DERMABOND ADVANCED .7 DNX12 (GAUZE/BANDAGES/DRESSINGS) ×1 IMPLANT
DRAPE C-ARM 42X72 X-RAY (DRAPES) ×2 IMPLANT
DRAPE C-ARMOR (DRAPES) IMPLANT
DRAPE HALF SHEET 40X57 (DRAPES) IMPLANT
DRAPE LAPAROTOMY 100X72X124 (DRAPES) ×1 IMPLANT
DRAPE POUCH INSTRU U-SHP 10X18 (DRAPES) ×1 IMPLANT
DRAPE SURG 17X23 STRL (DRAPES) ×1 IMPLANT
DRSG OPSITE 4X5.5 SM (GAUZE/BANDAGES/DRESSINGS) IMPLANT
DRSG OPSITE POSTOP 4X10 (GAUZE/BANDAGES/DRESSINGS) IMPLANT
DRSG OPSITE POSTOP 4X8 (GAUZE/BANDAGES/DRESSINGS) IMPLANT
DURAPREP 26ML APPLICATOR (WOUND CARE) ×1 IMPLANT
ELECTRODE REM PT RTRN 9FT ADLT (ELECTROSURGICAL) ×1 IMPLANT
EVACUATOR 1/8 PVC DRAIN (DRAIN) ×1 IMPLANT
GAUZE 4X4 16PLY ~~LOC~~+RFID DBL (SPONGE) IMPLANT
GAUZE SPONGE 4X4 12PLY STRL (GAUZE/BANDAGES/DRESSINGS) ×1 IMPLANT
GLOVE BIO SURGEON STRL SZ7 (GLOVE) IMPLANT
GLOVE BIO SURGEON STRL SZ8 (GLOVE) ×2 IMPLANT
GLOVE BIOGEL PI IND STRL 7.0 (GLOVE) IMPLANT
GLOVE ECLIPSE 7.5 STRL STRAW (GLOVE) IMPLANT
GLOVE EXAM NITRILE XL STR (GLOVE) IMPLANT
GLOVE INDICATOR 8.5 STRL (GLOVE) ×4 IMPLANT
GOWN STRL REUS W/ TWL LRG LVL3 (GOWN DISPOSABLE) IMPLANT
GOWN STRL REUS W/ TWL XL LVL3 (GOWN DISPOSABLE) ×2 IMPLANT
GOWN STRL REUS W/TWL 2XL LVL3 (GOWN DISPOSABLE) IMPLANT
GRAFT BNE FBR PLIAFX PRIME 10 (Bone Implant) IMPLANT
GRAFT BNE MATRIX VG FRMBL L 10 (Bone Implant) IMPLANT
KIT BASIN OR (CUSTOM PROCEDURE TRAY) ×1 IMPLANT
KIT POSITIONER JACKSON TABLE (MISCELLANEOUS) IMPLANT
KIT TURNOVER KIT B (KITS) ×1 IMPLANT
MILL BONE PREP (MISCELLANEOUS) IMPLANT
NDL HYPO 25X1 1.5 SAFETY (NEEDLE) ×1 IMPLANT
NEEDLE HYPO 25X1 1.5 SAFETY (NEEDLE) ×1 IMPLANT
NS IRRIG 1000ML POUR BTL (IV SOLUTION) ×1 IMPLANT
PACK LAMINECTOMY NEURO (CUSTOM PROCEDURE TRAY) ×1 IMPLANT
PAD ARMBOARD POSITIONER FOAM (MISCELLANEOUS) ×3 IMPLANT
PLATE ANTC HORIZ 4.75X41 NS (Plate) IMPLANT
ROD SPNL CD 4.75X500 (Rod) IMPLANT
SCREW MAS 6.5X40 (Screw) IMPLANT
SCREW MAS 6.5X45 (Screw) IMPLANT
SCREW SET SOLERA TI (Screw) IMPLANT
SPIKE FLUID TRANSFER (MISCELLANEOUS) ×1 IMPLANT
SPONGE SURGIFOAM ABS GEL 100 (HEMOSTASIS) ×1 IMPLANT
SPONGE T-LAP 4X18 ~~LOC~~+RFID (SPONGE) IMPLANT
STRIP CLOSURE SKIN 1/2X4 (GAUZE/BANDAGES/DRESSINGS) ×2 IMPLANT
SUT VIC AB 0 CT1 18XCR BRD8 (SUTURE) ×2 IMPLANT
SUT VIC AB 2-0 CT1 18 (SUTURE) ×1 IMPLANT
SUT VIC AB 4-0 PS2 18 (SUTURE) ×1 IMPLANT
TOWEL GREEN STERILE (TOWEL DISPOSABLE) ×1 IMPLANT
TOWEL GREEN STERILE FF (TOWEL DISPOSABLE) ×1 IMPLANT
TRAY FOLEY MTR SLVR 16FR STAT (SET/KITS/TRAYS/PACK) ×1 IMPLANT
WATER STERILE IRR 1000ML POUR (IV SOLUTION) ×1 IMPLANT

## 2023-09-03 NOTE — Consult Note (Addendum)
 ELECTROPHYSIOLOGY CONSULT NOTE    Patient ID: Aaron Richmond MRN: [id number], DOB/AGE: [birth year] 78 y.o.  Admit date: 09/03/2023 Date of Consult: 09/03/2023  Primary Physician: Aaron Valeria Mayo, MD Primary Cardiologist: None  Electrophysiologist: New   Referring Provider: Dr. Onetha  Patient Profile: Aaron Richmond is a 78 y.o. male with a history of permanent AF (by EKG back to 2014 at least), HFrEF, h/o thoracic aortic aneurysm, and non-obstructive CAD who is being seen today for the evaluation of SVT at the request of Dr. Onetha.  HPI:  Aaron Richmond is a 78 y.o. male with medical history as above.   Pt previously followed by Dr. Maranda, Now Dr. Monetta.   Presented today for surgery for L1 burst fracture with cord compression status post kyphoplasty.  Pt noted to be in a narrow complex tachycardia and hypotension requiring pressor support after the case. Cardiology called to bedside and pt failed to cardiovert x 2 @ 200J. EP called to bedside.  Pt given adenosine 6 -> 12 through a peripheral IV with no affect on his rhythm.  Noted to have 1 PVC on telemetry but unable to save strip.   Pt failed additional cardioversion @200J  with pressure held. Pt started on amiodarone  and [place] retrieved from cath lab. Pt converted to AF RVR with one 360J shock.  Pressures rapidly improved.   Pt remains sedated from surgery and cardioversion and is unable to give history at this time.   Recommend transfer to ICU for weaning of pressors.    Labs Pending at time of exam.  Past Medical History:  Diagnosis Date   Aneurysm of aortic root    Overview:  Last Assessment & Plan:  Planning to see Dr Army for evaluation of 5cm aneurysm.  - Aaron Richmond check PFT in prep for possible procedure / SGY   Aortic atherosclerosis (HCC) 06/14/2022   Aortic regurgitation    Aortic valve insufficiency    Ascending aortic aneurysm (HCC) 09/11/2016   Atherosclerotic vascular disease 05/27/2022   Atrial  fibrillation (HCC)    Atrial fibrillation with RVR (HCC) 07/05/2022   BMI 27.0-27.9,adult 06/14/2022   BPH (benign prostatic hyperplasia)    BRBPR (bright red blood per rectum) 05/20/2023   Cardiomyopathy (HCC) 09/11/2016   CHF (congestive heart failure) (HCC)    CKD (chronic kidney disease) stage 2, GFR 60-89 ml/min 11/23/2021   Closed fracture of right hip (HCC) 03/10/2023   Coronary artery calcification seen on CT scan 10/13/2017   Dysrhythmia    Essential hypertension 06/14/2022   Gastrointestinal hemorrhage 05/20/2023   Heart murmur    High risk medication use 07/31/2022   History of cardiomyopathy 11/09/2021   History of pulmonary embolism 09/24/2012   Overview:  Last Assessment & Plan:  Hx PE x 2 per notes, ? 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  - needs auto-titration study by [employer] or local company, data to RB to adjust his  home device.    OSA (obstructive sleep apnea) 09/13/2022   Prediabetes    Primary osteoarthritis involving multiple joints 06/14/2022   Prostate cancer metastatic to bone (HCC) 06/14/2022   Pulmonary embolism (HCC)    Rhinitis    Sacral wound 04/11/2023   Seasonal allergies 06/14/2022   Spondylolisthesis of lumbar region 12/12/2021   Stage 3a chronic kidney disease (HCC) 06/14/2022   Unstageable pressure ulcer of sacral region (HCC) 03/10/2023     Surgical History:  Past  Surgical History:  Procedure Laterality Date   APPENDECTOMY     COLONOSCOPY N/A 05/23/2023   Procedure: COLONOSCOPY;  Surgeon: Elicia Claw, MD;  Location: [facility];  Service: Gastroenterology;  Laterality: N/A;   HERNIA REPAIR     LUMBAR LAMINECTOMY  02/06/2022   Facetectom & foraminotomy for decompression of the cauda equina & nerve root L4-5, Aaron PHEBE Dark, MD   TONSILLECTOMY       Medications Prior to Admission  Medication Sig Dispense Refill Last Dose/Taking   acetaminophen  (TYLENOL ) 500 MG tablet Take 1,000 mg by mouth in the morning and at bedtime.   09/02/2023   Cyanocobalamin  (B-12) 5000 MCG CAPS Take 1 capsule by mouth daily at 12 noon.   09/02/2023   FARXIGA  5 MG TABS tablet Take 1 tablet by mouth once daily 90 tablet 0 Past Week   KLOR-CON  M20 20 MEQ tablet Take 20 mEq by mouth daily.   09/02/2023   metoprolol  tartrate (LOPRESSOR ) 50 MG tablet TAKE 1 & 1/2 (ONE & ONE-HALF) TABLETS BY MOUTH TWICE DAILY 180 tablet 0 09/03/2023 at  4:45 AM   Multiple Vitamin (MULTIVITAMIN WITH MINERALS) TABS tablet Take 1 tablet by mouth daily.   09/02/2023   polyethylene glycol (MIRALAX  / GLYCOLAX ) 17 g packet Take 17 g by mouth daily as needed for mild constipation.   Past Week   rosuvastatin  (CRESTOR ) 5 MG tablet Take 1 tablet by mouth once daily 90 tablet 2 09/02/2023   sacubitril -valsartan  (ENTRESTO ) 24-26 MG Take 1 tablet by mouth 2 (two) times daily. 180 tablet 3 09/03/2023 at  4:45 AM   tamsulosin  (FLOMAX ) 0.4 MG CAPS capsule TAKE 1 CAPSULE BY MOUTH TWICE DAILY 30 MINUTES FOLLOWING  THE  SAME  MEAL  EACH  DAY 180 capsule 0 09/03/2023 at  4:45 AM   torsemide  (DEMADEX ) 20 MG tablet Take 1 tablet by mouth twice daily 180 tablet 0 09/02/2023   traMADol  (ULTRAM ) 50 MG tablet Take 2 tablets (100 mg total) by mouth every 8 (eight) hours as needed. 120 tablet 1 Past Week   methocarbamol  (ROBAXIN ) 500 MG tablet Take 500 mg by mouth 4 (four) times daily. (Patient not taking: Reported on 08/29/2023)       rivaroxaban  (XARELTO ) 20 MG TABS tablet Take 1 tablet (20 mg total) by mouth daily with supper.   08/30/2023    Inpatient Medications:   Chlorhexidine  Gluconate Cloth  6 each Topical Once   And   Chlorhexidine  Gluconate Cloth  6 each Topical Once    Allergies:  Allergies  Allergen Reactions   Ciprofloxacin Other (See Comments)    Contraindicated because of increased risk of rupture of ascending thoracic aortic aneurysm    Family History  Problem Relation Age of Onset   Stroke Mother    Alzheimer's disease Mother    Heart attack Mother    Stroke Father    Diabetes Mellitus II Father      Physical Exam: Vitals:   09/03/23 1445 09/03/23 1500 09/03/23 1515  09/03/23 1530  BP: (!) 85/55 (!) 81/58 100/62 (!) 85/47  Pulse: (!) 169 (!) 168 96 79  Resp: (!) 31 (!) 29 17 (!) 23  Temp:      TempSrc:      SpO2: 95% 97% 97% (!) 82%  Weight:      Height:        GEN- The patient is intubated .  Lungs- Clear  Heart- Irregularly, irregular rate and rhythm, no murmurs, rubs or gallops GI- soft Extremities- no clubbing or cyanosis. No edema Skin- no rash or lesion Neuro- Sedated.     Radiology/Studies: DG THORACOLUMBAR SPINE Addendum Date: 09/03/2023 ADDENDUM REPORT: 09/03/2023 13:12 ADDENDUM: Addendum created as additional images were provided after initial interpretation. Two additional images demonstrate posterior rods traversing the pedicle screws, extending inferiorly beyond the field of view. Electronically Signed   By: Aaron Richmond M.D.   On: 09/03/2023 13:12   Result Date: 09/03/2023 CLINICAL DATA:  Elective surgery. EXAM: THORACOLUMBAR SPINE 2V COMPARISON:  Radiograph 04/09/2023 FINDINGS: Five fluoroscopic spot views of the thoracolumbar spine submitted from the operating room. Pedicle screws extend from T10 through L3 with remote L1 kyphoplasty. Lower lumbar surgical hardware is partially included in the field of view. Fluoroscopy time 2 minutes 9 seconds. Dose 36.5  mGy. IMPRESSION: Intraoperative fluoroscopy during thoracolumbar fusion. Electronically Signed: By: Aaron Richmond M.D. On: 09/03/2023 12:45   DG C-Arm 1-60 Min-No Report Result Date: 09/03/2023 Fluoroscopy was utilized by the requesting physician.  No radiographic interpretation.   DG C-Arm 1-60 Min-No Report Result Date: 09/03/2023 Fluoroscopy was utilized by the requesting physician.  No radiographic interpretation.   DG C-Arm 1-60 Min-No Report Result Date: 09/03/2023 Fluoroscopy was utilized by the requesting physician.  No radiographic interpretation.    ZXH:dyntd narrow complex tachycardia at 165 bpm (personally reviewed)  TELEMETRY: SVT 160-170s -> cardioverted to AF in 90-110s (personally reviewed)  Assessment/Plan:  Permanent AF SVT Arrhythmia may represent an SVT or rapidly conducted atrial flutter.  Not affected by adenosine, but given via peripheral, so not likely diagnostic.  Give Amiodarone  bolus then 60 mg x 6 hr then 30 mg an hr.  Remains in AF s/p cardioversion.   Hypotension Wean pressors as tolerated now s/p cardiversion  Chronic systolic CHF Non obstructive CAD EF 35-40% 07/04/2023 CT coronary 7/10 with mild/mod non-obstructive disease.   S/p Lumbar Surgery Per neurosurgery  EP Aaron Richmond follow along.   For questions or updates, please contact [facility] Please consult [email or web address] for contact info under     Signed, Aaron Prentice Passey, PA-C  09/03/2023, 3:47 PM       I have seen and examined this patient with Aaron Richmond.  Agree with above, note added to reflect my findings.  Patient with a past history as above.  He recently had surgery for a L1 burst fracture with cord compression post kyphoplasty.  He was noted to be in a narrow complex tachycardia in the PACU.  He was given initially adenosine and cardioverted at 200 J which did not convert him to sinus rhythm.  He was started on amiodarone  and cardioverted at 360 J which resulted  in atrial fibrillation.  He does have a longstanding history of atrial fibrillation.  His blood pressure was initially low, but it is since improved with improved heart rates.  GEN: No acute distress.   Neck: No JVD Cardiac: Irregular, tachycardic Respiratory: normal BS bases bilaterally. GI: Soft, nontender, non-distended  MS: No edema; No deformity. Neuro:  Nonfocal  Skin: warm and dry Psych: Normal affect    Narrow complex tachycardia: Has a history of longstanding persistent, likely permanent atrial fibrillation.  Tachycardia is either atrial flutter or SVT, though SVT would be much less likely given his history of atrial fibrillation.  He is in atrial fibrillation post cardioversion.  Now on amiodarone .  Would continue IV amiodarone  for now. Permanent atrial fibrillation: Anticoagulation per primary team.  On amiodarone . Chronic systolic heart failure: No obvious volume overload.  Hurman Ketelsen M. Tru Leopard MD 09/03/2023 6:44 PM

## 2023-09-03 NOTE — Anesthesia Postprocedure Evaluation (Signed)
 Anesthesia Post Note  Patient: Aaron Richmond  Procedure(s) Performed: Posterior lateral fusion - Thoracic Ten-Thoracic Eleven - Thoracic Eleven -Thoracic Twelve - Thoracic Twelve-Lumbar One - Lumbar One-Lumbar Two - Lumbar Two-Lumbar Three - Lumbar Three-Lumbar Four, laminectomy and foraminotomy transpedicular decompression Lumbar One (Back)     Patient location during evaluation: PACU Anesthesia Type: General Level of consciousness: awake and alert Pain management: pain level controlled Vital Signs Assessment: vitals unstable Respiratory status: spontaneous breathing, nonlabored ventilation, respiratory function stable and patient connected to face mask oxygen Cardiovascular status: unstable Postop Assessment: no apparent nausea or vomiting Anesthetic complications: yes Comments: Patient went into non stable SVT at end of case.  Eventually cardioverted in PACU with the help of Cardiology.  Patient with pressor requirement. Will be admitted to ICU for the night.   No notable events documented.  Last Vitals:  Vitals:   09/03/23 0601  BP: (!) 95/45  Pulse: 75  Resp: 18  Temp: 36.8 C  SpO2: 96%    Last Pain:  Vitals:   09/03/23 0629  TempSrc:   PainSc: 9                  Butler Levander Pinal

## 2023-09-03 NOTE — Plan of Care (Signed)
   Problem: Clinical Measurements: Goal: Ability to maintain clinical measurements within normal limits will improve Outcome: Progressing

## 2023-09-03 NOTE — Anesthesia Procedure Notes (Signed)
 Procedure Name: Intubation Date/Time: 09/03/2023 7:43 AM  Performed by: Dewayne Jurek C, CRNAPre-anesthesia Checklist: Patient identified, Emergency Drugs available, Suction available and Patient being monitored Patient Re-evaluated:Patient Re-evaluated prior to induction Oxygen Delivery Method: Circle system utilized Preoxygenation: Pre-oxygenation with 100% oxygen Induction Type: IV induction Ventilation: Mask ventilation without difficulty Laryngoscope Size: Glidescope and 4 Grade View: Grade I Tube type: Oral Number of attempts: 1 Airway Equipment and Method: Oral airway, Video-laryngoscopy and Rigid stylet Placement Confirmation: ETT inserted through vocal cords under direct vision, positive ETCO2 and breath sounds checked- equal and bilateral Secured at: 22 cm Tube secured with: Tape Dental Injury: Teeth and Oropharynx as per pre-operative assessment

## 2023-09-03 NOTE — Op Note (Addendum)
 Preoperative diagnosis: L1 burst fracture with cord compression status post kyphoplasty.  Postoperative diagnosis: Same.  Procedure: #1 bilateral transpedicular decompression of L1.  2.  Pedicle screw fixation T10-L4 utilizing the Medtronic Solera pedicle screw set tying into a previous construct at L4-S1.  3.  Posterolateral arthrodesis T10-11, T11-12, T12-L1, L1-L2, L2-L3, L3-L4.  4.  Open reduction of spinal deformity.  Surgeon: Arley helling.  Assistant: Rockey Peru.  Assistant to: Suzen Click.  Anesthesia: General.  EBL: 400.  HPI: 78 year old gentleman who previously undergone L4-S1 fusion as well as kyphoplasty for an L1 believed to be compression fracture however patient presented with progressive worsening back pain numbness in his legs and weakness and workup revealed progressive kyphosis displacement of his L1 kyphoplasty and fracture into his lower part of the spinal cord and conus.  So due to patient's progression of clinical syndrome imaging findings failed conservative treatment I recommended transpedicular decompression at L1 with pedicle screw fixation T10-L4.  I extensively reviewed the risks and benefits of the operation with the patient as well as perioperative course expectations of outcome and alternatives to surgery and he understood and agreed to proceed forward.  Operative procedure: Patient was brought into the OR was [place] general esthesia positioned prone the [place] table his back was prepped and draped in routine sterile fashion his old incision was opened up and extended cephalad subperiosteal dissection was carried out on the lamina of T9-10 T10-11 all the way down to exposing the previously placed fusion mass at L4 S1.  Fusion did appear to be solidly disconnected the knots and rods and left the screws in place then under AP and lateral fluoroscopy placed bilateral pedicle screws at T10, T11, T12, L2, and L3 bilaterally.  Then this was all done with  fluoroscopy all screws with excellent purchase and then performed a complete central decompressive laminectomy of L1 complete facetectomy the drilled down the pedicles bilaterally and drilled into the vertebral body and fracture mass with cement ventral to the spinal cord.  Then utilizing SMD downgoing curettes I pushed the retropulsed bone fragments off of the ventral dura and thecal sac and spinal cord back into the vertebral body defect.  I worked from disc base to the disc base underneath the L1 nerve root until I was easily able to pass a angled ball-tipped probe all the way across the midline without any apparent resistance.  After adequate decompression achieved this was packed away I then contoured rods and placed them compressing the left side at T12-L2 and distracting the right side at T12 and L2 this helped reduce the scoliotic deformity as well as the compression helped reduce the kyphosis.  Rechecked imaging to confirm good position of all the implants and reduction of the deformity.  Then all the foramina reinspected to confirm patency Gelfoam was ON top of dura a medium Hemovac drain was placed Exparel  was injected in the fascia and the wound was closed in layers with interrupted Vicryl and a running 4 subcuticular.  Dermabond benzoin Steri-Strips and sterile dressings applied patient to cover him in stable condition.  At the end the case on needle count sponge counts were correct.  Addendum: 1 during the procedure and prior to connecting all the hardware all the TPs pars lateral masses and facet joints were aggressively decorticated and extensive mount of autograft material to include Vivigen BMP and Magnifuse was packed posterolaterally from T10 down to L4 at T10-11, T11-12, T12-L1, L1-L2, L2-L3, and L3-L4,

## 2023-09-03 NOTE — Progress Notes (Signed)
 Orthopedic Tech Progress Note Patient Details:  Aaron Richmond June 14, 1945 [id number]  Ortho Devices Type of Ortho Device: Lumbar corsett Ortho Device/Splint Location: back Ortho Device/Splint Interventions: Ordered, Adjustment   Post Interventions Patient Tolerated: Other (comment) Instructions Provided: Other (comment), Adjustment of device, Care of device Delivered brace and gave RN instructions on how to use brace when patient is upright. Told RN to call ortho if still unsure.  Camellia Bo 09/03/2023, 6:07 PM

## 2023-09-03 NOTE — Anesthesia Procedure Notes (Signed)
 Arterial Line Insertion Start/End8/27/2025 8:00 AM, 09/03/2023 8:06 AM Performed by: Lorielle Boehning C, CRNA, CRNA  Preanesthetic checklist: patient identified, IV checked, site marked, risks and benefits discussed, surgical consent, monitors and equipment checked, pre-op evaluation and timeout performed Left, radial was placed Catheter size: 20 G Hand hygiene performed  Allen's test indicative of satisfactory collateral circulation Attempts: 1 Procedure performed without using ultrasound guided technique. Following insertion, dressing applied and Biopatch. Post procedure assessment: normal  Patient tolerated the procedure well with no immediate complications.

## 2023-09-03 NOTE — Transfer of Care (Signed)
 Immediate Anesthesia Transfer of Care Note  Patient: Aaron Richmond  Procedure(s) Performed: Posterior lateral fusion - Thoracic Ten-Thoracic Eleven - Thoracic Eleven -Thoracic Twelve - Thoracic Twelve-Lumbar One - Lumbar One-Lumbar Two - Lumbar Two-Lumbar Three - Lumbar Three-Lumbar Four, laminectomy and foraminotomy transpedicular decompression Lumbar One (Back)  Patient Location: PACU  Anesthesia Type:General  Level of Consciousness: awake, alert , and oriented  Airway & Oxygen Therapy: Patient Spontanous Breathing and Patient connected to face mask oxygen  Post-op Assessment: Report given to RN and Post -op Vital signs reviewed and unstable, Anesthesiologist notified  Post vital signs: Reviewed and unstable  Last Vitals:  Vitals Value Taken Time  BP 100/57 09/03/23 14:15  Temp    Pulse 164 09/03/23 14:22  Resp 28 09/03/23 14:22  SpO2 99 % 09/03/23 14:22  Vitals shown include unfiled device data.  Last Pain:  Vitals:   09/03/23 0629  TempSrc:   PainSc: 9       Patients Stated Pain Goal: 3 (09/03/23 0622)  Complications: No notable events documented.

## 2023-09-03 NOTE — H&P (Signed)
 Aaron Richmond is an 78 y.o. male.   Chief Complaint: Back and right leg pain numbness tingling in his legs HPI: 78 year old gentleman with numbness tingling in his legs severe back pain and weakness in his right foot workup revealed severe cord compression at L1 marked spondylosis at that level so we recommended transpedicular decompression of L1 and posterolateral instrumented fusion from T10 tying into his old construct at L4.  I have extensively gone over the risks and benefits of that procedure with him as well as perioperative course expectations of outcome and alternatives to surgery he understands and agrees to proceed forward.  Past Medical History:  Diagnosis Date   Aneurysm of aortic root    Overview:  Last Assessment & Plan:  Planning to see Dr Army for evaluation of 5cm aneurysm.  - will check PFT in prep for possible procedure / SGY   Aortic atherosclerosis (HCC) 06/14/2022   Aortic regurgitation    Aortic valve insufficiency    Ascending aortic aneurysm (HCC) 09/11/2016   Atherosclerotic vascular disease 05/27/2022   Atrial fibrillation (HCC)    Atrial fibrillation with RVR (HCC) 07/05/2022   BMI 27.0-27.9,adult 06/14/2022   BPH (benign prostatic hyperplasia)    BRBPR (bright red blood per rectum) 05/20/2023   Cardiomyopathy (HCC) 09/11/2016   CHF (congestive heart failure) (HCC)    CKD (chronic kidney disease) stage 2, GFR 60-89 ml/min 11/23/2021   Closed fracture of right hip (HCC) 03/10/2023   Coronary artery calcification seen on CT scan 10/13/2017   Dysrhythmia    Essential hypertension 06/14/2022   Gastrointestinal hemorrhage 05/20/2023   Heart murmur    High risk medication use 07/31/2022   History of cardiomyopathy 11/09/2021   History of pulmonary embolism 09/24/2012   Overview:  Last Assessment & Plan:  Hx PE x 2 per notes, ? 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  - needs auto-titration study by [employer] or local company, data to RB to adjust his home device.    OSA (obstructive sleep apnea) 09/13/2022   Prediabetes    Primary osteoarthritis involving multiple joints 06/14/2022   Prostate cancer metastatic to bone (HCC) 06/14/2022   Pulmonary embolism (HCC)    Rhinitis    Sacral wound 04/11/2023   Seasonal allergies 06/14/2022   Spondylolisthesis of lumbar region 12/12/2021   Stage 3a chronic kidney disease (HCC) 06/14/2022   Unstageable pressure ulcer of sacral region (HCC) 03/10/2023    Past Surgical History:  Procedure Laterality Date   APPENDECTOMY     COLONOSCOPY N/A 05/23/2023   Procedure: COLONOSCOPY;  Surgeon: Elicia Claw, MD;  Location: [facility];  Service: Gastroenterology;  Laterality: N/A;   HERNIA REPAIR     LUMBAR LAMINECTOMY  02/06/2022   Facetectom & foraminotomy for decompression of the cauda equina & nerve root L4-5, Arley PHEBE Dark, MD   TONSILLECTOMY      Family History  Problem Relation Age of Onset  Stroke Mother    Alzheimer's disease Mother    Heart attack Mother    Stroke Father    Diabetes Mellitus II Father    Social History:  reports that he has never smoked. He has never used smokeless tobacco. He reports that he does not drink alcohol  and does not use drugs.  Allergies:  Allergies  Allergen Reactions   Ciprofloxacin Other (See Comments)    Contraindicated because of increased risk of rupture of ascending  thoracic aortic aneurysm    Medications Prior to Admission  Medication Sig Dispense Refill   acetaminophen  (TYLENOL ) 500 MG tablet Take 1,000 mg by mouth in the morning and at bedtime.     Cyanocobalamin  (B-12) 5000 MCG CAPS Take 1 capsule by mouth daily at 12 noon.     FARXIGA  5 MG TABS tablet Take 1 tablet by mouth once daily 90 tablet 0   KLOR-CON  M20 20 MEQ tablet Take 20 mEq by mouth daily.     metoprolol  tartrate (LOPRESSOR ) 50 MG tablet TAKE 1 & 1/2 (ONE & ONE-HALF) TABLETS BY MOUTH TWICE DAILY 180 tablet 0   Multiple Vitamin (MULTIVITAMIN WITH MINERALS) TABS tablet Take 1 tablet by mouth daily.     polyethylene glycol (MIRALAX  / GLYCOLAX ) 17 g packet Take 17 g by mouth daily as needed for mild constipation.     rosuvastatin  (CRESTOR ) 5 MG tablet Take 1 tablet by mouth once daily 90 tablet 2   sacubitril -valsartan  (ENTRESTO ) 24-26 MG Take 1 tablet by mouth 2 (two) times daily. 180 tablet 3   tamsulosin  (FLOMAX ) 0.4 MG CAPS capsule TAKE 1 CAPSULE BY MOUTH TWICE DAILY 30 MINUTES FOLLOWING  THE  SAME  MEAL  EACH  DAY 180 capsule 0   torsemide  (DEMADEX ) 20 MG tablet Take 1 tablet by mouth twice daily 180 tablet 0   traMADol  (ULTRAM ) 50 MG tablet Take 2 tablets (100 mg total) by mouth every 8 (eight) hours as needed. 120 tablet 1   methocarbamol  (ROBAXIN ) 500 MG tablet Take 500 mg by mouth 4 (four) times daily. (Patient not taking: Reported on 08/29/2023)     rivaroxaban  (XARELTO ) 20 MG TABS tablet Take 1 tablet (20 mg total) by mouth daily with supper.      No results found for this or any previous visit (from the past 48 hours). No results found.  Review of Systems  Musculoskeletal:  Positive for back pain.  Neurological:  Positive for weakness and numbness.    Blood pressure (!) 95/45, pulse 75, temperature 98.3 F (36.8 C), temperature source Oral, resp. rate 18, height 5' 11 (1.803 m), weight 76.2 kg, SpO2 96%. Physical Exam HENT:     Head: Normocephalic.     Right Ear:  Tympanic membrane normal.     Nose: Nose normal.     Mouth/Throat:     Mouth: Mucous membranes are moist.  Cardiovascular:     Rate and Rhythm: Normal rate.     Pulses: Normal pulses.  Pulmonary:     Effort: Pulmonary effort is normal.  Musculoskeletal:        General: Normal range of motion.     Cervical back: Normal range of motion.  Neurological:     Mental Status: He is alert.     Comments: Right-sided complete foot drop 0 out of 5 dorsiflexion otherwise lower extremities are 5 out of 5 iliopsoas, quads, hamstrings, gastrocs, tibialis, and EHL.      Assessment/Plan 78 year old presents for transpedicular decompression of L1 instrumented fusion  from T10-L4.  Arley SHAUNNA Helling, MD 09/03/2023, 7:24 AM

## 2023-09-04 DIAGNOSIS — I5022 Chronic systolic (congestive) heart failure: Secondary | ICD-10-CM | POA: Diagnosis not present

## 2023-09-04 DIAGNOSIS — I4821 Permanent atrial fibrillation: Secondary | ICD-10-CM | POA: Diagnosis not present

## 2023-09-04 DIAGNOSIS — I4719 Other supraventricular tachycardia: Secondary | ICD-10-CM | POA: Diagnosis not present

## 2023-09-04 MED ORDER — AMIODARONE HCL 200 MG PO TABS
200.0000 mg | ORAL_TABLET | Freq: Two times a day (BID) | ORAL | Status: DC
  Administered 2023-09-04 – 2023-09-05 (×2): 200 mg via ORAL
  Filled 2023-09-04 (×2): qty 1

## 2023-09-04 MED ORDER — MAGNESIUM SULFATE 2 GM/50ML IV SOLN
2.0000 g | Freq: Once | INTRAVENOUS | Status: AC
  Administered 2023-09-04: 2 g via INTRAVENOUS
  Filled 2023-09-04: qty 50

## 2023-09-04 MED FILL — Heparin Sodium (Porcine) Inj 1000 Unit/ML: INTRAMUSCULAR | Qty: 60 | Status: AC

## 2023-09-04 MED FILL — Sodium Chloride IV Soln 0.9%: INTRAVENOUS | Qty: 2000 | Status: AC

## 2023-09-04 NOTE — Plan of Care (Signed)
  Problem: Education: Goal: Knowledge of General Education information will improve Description: Including pain rating scale, medication(s)/side effects and non-pharmacologic comfort measures Outcome: Progressing   Problem: Health Behavior/Discharge Planning: Goal: Ability to manage health-related needs will improve Outcome: Progressing   Problem: Clinical Measurements: Goal: Ability to maintain clinical measurements within normal limits will improve Outcome: Progressing Goal: Will remain free from infection Outcome: Progressing Goal: Diagnostic test results will improve Outcome: Progressing Goal: Respiratory complications will improve Outcome: Progressing Goal: Cardiovascular complication will be avoided Outcome: Progressing   Problem: Activity: Goal: Risk for activity intolerance will decrease Outcome: Progressing   Problem: Nutrition: Goal: Adequate nutrition will be maintained Outcome: Progressing   Problem: Coping: Goal: Level of anxiety will decrease Outcome: Progressing   Problem: Elimination: Goal: Will not experience complications related to bowel motility Outcome: Progressing Goal: Will not experience complications related to urinary retention Outcome: Progressing   Problem: Pain Managment: Goal: General experience of comfort will improve and/or be controlled Outcome: Progressing   Problem: Safety: Goal: Ability to remain free from injury will improve Outcome: Progressing   Problem: Skin Integrity: Goal: Risk for impaired skin integrity will decrease Outcome: Progressing   Problem: Education: Goal: Ability to verbalize activity precautions or restrictions will improve Outcome: Progressing Goal: Knowledge of the prescribed therapeutic regimen will improve Outcome: Progressing Goal: Understanding of discharge needs will improve Outcome: Progressing   Problem: Activity: Goal: Ability to avoid complications of mobility impairment will  improve Outcome: Progressing Goal: Ability to tolerate increased activity will improve Outcome: Progressing Goal: Will remain free from falls Outcome: Progressing   Problem: Bowel/Gastric: Goal: Gastrointestinal status for postoperative course will improve Outcome: Not Progressing   Problem: Clinical Measurements: Goal: Ability to maintain clinical measurements within normal limits will improve Outcome: Progressing Goal: Postoperative complications will be avoided or minimized Outcome: Progressing Goal: Diagnostic test results will improve Outcome: Progressing   Problem: Pain Management: Goal: Pain level will decrease Outcome: Progressing   Problem: Skin Integrity: Goal: Will show signs of wound healing Outcome: Progressing   Problem: Health Behavior/Discharge Planning: Goal: Identification of resources available to assist in meeting health care needs will improve Outcome: Progressing   Problem: Bladder/Genitourinary: Goal: Urinary functional status for postoperative course will improve Outcome: Progressing

## 2023-09-04 NOTE — Progress Notes (Signed)
 Subjective: Patient reports doing well, having a lot of back pain   Objective: Vital signs in last 24 hours: Temp:  [97 F (36.1 C)-98.8 F (37.1 C)] 98.2 F (36.8 C) (08/28 0700) Pulse Rate:  [58-169] 61 (08/28 0645) Resp:  [10-31] 20 (08/28 0645) BP: (81-111)/(37-62) 108/44 (08/28 0331) SpO2:  [71 %-100 %] 93 % (08/28 0645) Arterial Line BP: (70-135)/(28-57) 123/40 (08/28 0645) Weight:  [78 kg] 78 kg (08/28 0555)  Intake/Output from previous day: 08/27 0701 - 08/28 0700 In: 4148.2 [P.O.:240; I.V.:3240.2; Blood:125; IV Piggyback:543] Out: 1525 [Urine:625; Drains:500; Blood:400] Intake/Output this shift: No intake/output data recorded.  Neurologic: Grossly normal  Lab Results: Lab Results  Component Value Date   WBC 7.3 08/29/2023   HGB 11.2 (L) 09/03/2023   HCT 34.0 (L) 09/03/2023   MCV 95.1 08/29/2023   PLT 232 08/29/2023   Lab Results  Component Value Date   INR 1.4 (H) 05/22/2023   BMET Lab Results  Component Value Date   NA 137 09/03/2023   K 3.9 09/03/2023   CL 103 09/03/2023   CO2 20 (L) 09/03/2023   GLUCOSE 179 (H) 09/03/2023   BUN 20 09/03/2023   CREATININE 1.23 09/03/2023   CALCIUM  8.7 (L) 09/03/2023    Studies/Results: DG THORACOLUMBAR SPINE Addendum Date: 09/03/2023 ADDENDUM REPORT: 09/03/2023 13:12 ADDENDUM: Addendum created as additional images were provided after initial interpretation. Two additional images demonstrate posterior rods traversing the pedicle screws, extending inferiorly beyond the field of view. Electronically Signed   By: Andrea Gasman M.D.   On: 09/03/2023 13:12   Result Date: 09/03/2023 CLINICAL DATA:  Elective surgery. EXAM: THORACOLUMBAR SPINE 2V COMPARISON:  Radiograph 04/09/2023 FINDINGS: Five fluoroscopic spot views of the thoracolumbar spine submitted from the operating room. Pedicle screws extend from T10 through L3 with remote L1 kyphoplasty. Lower lumbar surgical hardware is partially included in the field of view.  Fluoroscopy time 2 minutes 9 seconds. Dose 36.5 mGy. IMPRESSION: Intraoperative fluoroscopy during thoracolumbar fusion. Electronically Signed: By: Andrea Gasman M.D. On: 09/03/2023 12:45   DG C-Arm 1-60 Min-No Report Result Date: 09/03/2023 Fluoroscopy was utilized by the requesting physician.  No radiographic interpretation.   DG C-Arm 1-60 Min-No Report Result Date: 09/03/2023 Fluoroscopy was utilized by the requesting physician.  No radiographic interpretation.   DG C-Arm 1-60 Min-No Report Result Date: 09/03/2023 Fluoroscopy was utilized by the requesting physician.  No radiographic interpretation.    Assessment/Plan: Postop day 1 thoracolumbar fusion. Patient had postop complications with an arrhythmia, seems to be better today. Appreciate cardiology's help. Work with therapy today as tolerated.    LOS: 1 day    Aaron Richmond 09/04/2023, 8:05 AM

## 2023-09-04 NOTE — Progress Notes (Signed)
   Inpatient Rehab Admissions Coordinator :  Per therapy recommendations, patient was screened for CIR candidacy by Heron Leavell RN MSN.  At this time patient appears to be a potential candidate for CIR. Noted on pressors.I will place a rehab consult per protocol for full assessment. Please call me with any questions.  Heron Leavell RN MSN Admissions Coordinator [phone]

## 2023-09-04 NOTE — Evaluation (Addendum)
 Physical Therapy Evaluation Patient Details Name: Aaron Richmond MRN: [id number] DOB: 1945/11/12 Today's Date: 09/04/2023  History of Present Illness  Pt is a 78 y/o male admitted 09/03/23 for planned transpedicular decompression of L1 instrumented fusion from T10-L4. During Sx Pt noted to be in a narrow complex tachycardia and hypotension requiring pressor support and cardioversion x2 and one shock. PMH includes Aneurysm of aortic root, Aortic atherosclerosis, Aortic regurgitation, Aortic valve insufficiency, Ascending aortic aneurysm, Atherosclerotic vascular disease, Atrial fibrillation, CHF, Closed fracture of right hip, Dysrhythmia, HTN, Gastrointestinal hemorrhage, Heart murmur, History of cardiomyopathy, PE, Lumbar radiculopathy, OSA, Prostate cancer metastatic to bone, Rhinitis   Clinical Impression  Patient is s/p above surgery and medical issues, presenting with functional limitations due to the deficits listed below (see PT Problem List). Previously ambulatory with SPC and Rt AFO due to hx drop foot following a prior fall and hip fracture s/p surgery. He required min assist for bed mobility and mod assist to stand today. Tolerated pre-gait activities and step pivot transfer training. BP running soft pre-activity, but improved up to 122/50 (MAP 74) after transfer training while sitting upright in recliner. SpO2 96% on 3L. Feels more steady and confident with RW for support. Educated extensively on back precautions and mobility techniques. Lives with wife who is available 24/7.  Patient will benefit from acute skilled PT to increase their independence and safety with mobility to facilitate discharge.   Patient will benefit from intensive inpatient follow-up therapy, >3 hours/day.      If plan is discharge home, recommend the following: A lot of help with walking and/or transfers;A lot of help with bathing/dressing/bathroom;Assistance with cooking/housework;Assist for transportation;Help with  stairs or ramp for entrance   Can travel by private vehicle        Equipment Recommendations None recommended by PT  Recommendations for Other Services  Rehab consult    Functional Status Assessment Patient has had a recent decline in their functional status and demonstrates the ability to make significant improvements in function in a reasonable and predictable amount of time.     Precautions / Restrictions Precautions Precautions: Back Precaution Booklet Issued: Yes (comment) Recall of Precautions/Restrictions: Intact Precaution/Restrictions Comments: able to state 3/3 precautions at end of session after education Required Braces or Orthoses: Spinal Brace;Other Brace Spinal Brace: Lumbar corset;Applied in sitting position (bathroom privledges) Other Brace: AFO RLE Restrictions Weight Bearing Restrictions Per Provider Order: No      Mobility  Bed Mobility Overal bed mobility: Needs Assistance Bed Mobility: Rolling, Sidelying to Sit Rolling: Min assist, Used rails Sidelying to sit: Min assist, Used rails       General bed mobility comments: Min assist for terminal portion of log roll to assume full sidelying postion and lower LEs from edge of bed. Min assist for trunk support to rise. Cues for technique and precautions.    Transfers Overall transfer level: Needs assistance Equipment used: Rolling walker (2 wheels), 1 person hand held assist Transfers: Sit to/from Stand, Bed to chair/wheelchair/BSC Sit to Stand: Mod assist   Step pivot transfers: Min assist       General transfer comment: Mod assist for boost and balance, with posterior lean noted. Hand held support to rise, transitioned to RW which greatly improved stability and confidence. Step pivot transfer to recliner with significant Rt foot drop, compensatory hip flexion for adequate clearance. Min assist for balance with steps to chair. Cues for hand placement rising and sitting.    Ambulation/Gait  Pre-gait activities: Weight shift, static march.    Stairs            Wheelchair Mobility     Tilt Bed    Modified Rankin (Stroke Patients Only)       Balance Overall balance assessment: Needs assistance Sitting-balance support: No upper extremity supported, Feet supported Sitting balance-Leahy Scale: Fair     Standing balance support: Single extremity supported Standing balance-Leahy Scale: Poor                               Pertinent Vitals/Pain Pain Assessment Pain Assessment: Faces Faces Pain Scale: Hurts whole lot Pain Location: back Pain Descriptors / Indicators: Aching, Operative site guarding Pain Intervention(s): Limited activity within patient's tolerance, Monitored during session, Premedicated before session, Repositioned    Home Living Family/patient expects to be discharged to:: Private residence Living Arrangements: Spouse/significant other Available Help at Discharge: Family;Available 24 hours/day Type of Home: House Home Access: Stairs to enter Entrance Stairs-Rails: Lawyer of Steps: 1   Home Layout: One level Home Equipment: Agricultural consultant (2 wheels);Cane - single point;Shower seat;Lift chair      Prior Function Prior Level of Function : Needs assist             Mobility Comments: Ambulating with RW, Fall earlier this year resulting in Rt hip fracture ADLs Comments: Needs assist to donne RLE AFO     Extremity/Trunk Assessment   Upper Extremity Assessment Upper Extremity Assessment: Defer to OT evaluation    Lower Extremity Assessment Lower Extremity Assessment: Generalized weakness (Hx Rt ankle drop-foot, still present on exam. Further details to follow. MMT performed by Dr. Onetha bedside.)       Communication   Communication Communication: No apparent difficulties    Cognition Arousal: Alert Behavior During Therapy: [facility] for tasks assessed/performed   PT - Cognitive  impairments: No apparent impairments                         Following commands: Intact       Cueing Cueing Techniques: Verbal cues     General Comments General comments (skin integrity, edema, etc.): Reviewed precautions. LSO donned, instructions for use. Pre activity HR 66, BP 122/41 (MAP 69) SpO2 96% on 3L. Post activity BP 122/50 (MAP 74) HR 80s - with pt sitting in recliner, LEs dependent position.    Exercises General Exercises - Lower Extremity Ankle Circles/Pumps: AROM, Both, 10 reps, Supine (unable to DF Rt.)   Assessment/Plan    PT Assessment Patient needs continued PT services  PT Problem List Decreased strength;Decreased range of motion;Decreased activity tolerance;Decreased balance;Decreased mobility;Decreased knowledge of use of DME;Decreased knowledge of precautions;Cardiopulmonary status limiting activity;Impaired tone       PT Treatment Interventions DME instruction;Gait training;Stair training;Functional mobility training;Therapeutic activities;Therapeutic exercise;Balance training;Neuromuscular re-education;Patient/family education;Modalities    PT Goals (Current goals can be found in the Care Plan section)  Acute Rehab PT Goals Patient Stated Goal: Get well, return home PT Goal Formulation: With patient Time For Goal Achievement: 09/18/23 Potential to Achieve Goals: Good    Frequency Min 3X/week     Co-evaluation               AM-PAC PT 6 Clicks Mobility  Outcome Measure Help needed turning from your back to your side while in a flat bed without using bedrails?: A Little Help needed moving from lying on your back to  sitting on the side of a flat bed without using bedrails?: A Little Help needed moving to and from a bed to a chair (including a wheelchair)?: A Lot Help needed standing up from a chair using your arms (e.g., wheelchair or bedside chair)?: A Lot Help needed to walk in hospital room?: A Lot Help needed climbing 3-5 steps  with a railing? : Total 6 Click Score: 13    End of Session Equipment Utilized During Treatment: Gait belt;Back brace;Oxygen Activity Tolerance: Patient tolerated treatment well Patient left: in chair;with call bell/phone within reach;with chair alarm set Nurse Communication: Mobility status;Precautions PT Visit Diagnosis: Unsteadiness on feet (R26.81);Other abnormalities of gait and mobility (R26.89);Muscle weakness (generalized) (M62.81);History of falling (Z91.81);Difficulty in walking, not elsewhere classified (R26.2);Other symptoms and signs involving the nervous system (R29.898);Pain Pain - part of body:  (back)    Time: 9255-9175 PT Time Calculation (min) (ACUTE ONLY): 40 min   Charges:   PT Evaluation $PT Eval Moderate Complexity: 1 Mod PT Treatments $Therapeutic Activity: 23-37 mins PT General Charges $$ ACUTE PT VISIT: 1 Visit         Leontine Roads, PT, DPT [facility]  Rehabilitation Services Physical Therapist Office: [phone] Website: [email or web address]   Leontine GORMAN Roads 09/04/2023, 1:02 PM

## 2023-09-04 NOTE — Progress Notes (Addendum)
 Patient Name: Aaron Richmond Date of Encounter: 09/04/2023  Primary Cardiologist: None Electrophysiologist: None  Interval Summary   The patient is doing much better today.  Still on Norepinephrine  at 10mcg/min. At this time, the patient denies chest pain, shortness of breath, or any new concerns.  Vital Signs    Vitals:   09/04/23 0755 09/04/23 0800 09/04/23 0805 09/04/23 0810  BP:   (!) 124/41   Pulse: 63 68 69 76  Resp: 19 (!) 21 18 (!) 29  Temp:      TempSrc:      SpO2: 96% 95% 97% 95%  Weight:      Height:        Intake/Output Summary (Last 24 hours) at 09/04/2023 0846 Last data filed at 09/04/2023 0700 Gross per 24 hour  Intake 4048.19 ml  Output 1525 ml  Net 2523.19 ml   Filed Weights   09/03/23 0601 09/04/23 0555  Weight: 76.2 kg 78 kg    Physical Exam    GEN- NAD, Alert and oriented  Lungs- normal work of breathing Cardiac- Irregularly irregular rate and rhythm GI- soft, NT, ND, + BS Extremities- no clubbing or cyanosis. No edema  Telemetry    Atrial fibrillation with isolated run of NSVT. No recurrent SVT. HR 50s-70s (personally reviewed)  Hospital Course    Aaron Richmond is a 78 y.o. male with a history of permanent AF (by EKG back to 2014 at least), HFrEF, h/o thoracic aortic aneurysm, and non-obstructive CAD. Presented today for surgery for L1 burst fracture with cord compression status post kyphoplasty. Pt noted to be in a narrow complex tachycardia and hypotension requiring pressor support after the case. Cardiology called to bedside and pt failed to cardiovert x 2 @ 200J. EP called to bedside.  Pt given adenosine 6 -> 12 through a peripheral IV with no affect on his rhythm.  Noted to have 1 PVC on telemetry but unable to save strip. Pt failed additional cardioversion @200J  with pressure held. Pt started on amiodarone  and [place] retrieved from cath lab. Pt converted to AF RVR with one 360J shock.  Pressures rapidly improved.   Assessment &  Plan    Permanent atrial fibrillation Secondary hypercoagulable state Supraventricular tachycardia Patient without recurrent tachyarrhythmia overnight. Miranda Frese continue IV Amiodarone  through today and convert to PO regimen this evening, 200mg  BID. Anticipate continuing this dose/frequency for ~2 weeks and then reducing to 200mg  daily. Metoprolol  held with soft BP. EP Eulice Rutledge continue to follow.   Hypotension BP improved today, though he does remain on 10mcg/min norepinephrine .   Chronic HFrEF CAD (non-obstructive) EF 35-40% 07/04/2023. CT coronary 7/10 with mild/mod non-obstructive disease. No chest pain or dyspnea today. HF GDMT held with soft BP.   S/p Lumbar Surgery Per neurosurgery    For questions or updates, please contact [facility] Please consult [email or web address] for contact info under     Signed, Artist Pouch, PA-C  09/04/2023, 8:46 AM    I have seen and examined this patient with Artist Pouch.  Agree with above, note added to reflect my findings.  No further tachycardia overnight.  Did have a short run of ventricular tachycardia, the patient was minimally symptomatic.  Only complaints are of back pain.  GEN: No acute distress.   Neck: No JVD Cardiac: Irregular Respiratory: Normal work of breathing GI: Soft, nontender, non-distended  MS: No edema; No deformity. Neuro:  Nonfocal  Skin: warm and dry,  Psych: Normal affect    Permanent atrial fibrillation Narrow  complex tachycardia Chronic systolic heart failure Has had no further episodes of his narrow complex tachycardia.  He is in atrial fibrillation.  No plans for resumption of sinus rhythm.  On amiodarone  IV.  Yesly Gerety give 24 hours of amiodarone  and switch to p.o. amiodarone  this evening 200 mg twice daily.  No further EP need for ICU.  If blood pressure improves, could transfer to the floor.  Further plans per neurosurgery.  Tyre Beaver M. Markanthony Gedney MD 09/04/2023 9:16 AM

## 2023-09-04 NOTE — Evaluation (Signed)
 Occupational Therapy Evaluation Patient Details Name: Aaron Richmond MRN: [id number] DOB: 08-Jul-1945 Today's Date: 09/04/2023   History of Present Illness   Pt is a 78 y/o male admitted 09/03/23 for planned transpedicular decompression of L1 instrumented fusion from T10-L4. During Sx Pt noted to be in a narrow complex tachycardia and hypotension requiring pressor support and cardioversion x2 and one shock. PMH includes Aneurysm of aortic root, Aortic atherosclerosis, Aortic regurgitation, Aortic valve insufficiency, Ascending aortic aneurysm, Atherosclerotic vascular disease, Atrial fibrillation, CHF, Closed fracture of right hip, Dysrhythmia, HTN, Gastrointestinal hemorrhage, Heart murmur, History of cardiomyopathy, PE, Lumbar radiculopathy, OSA, Prostate cancer metastatic to bone, Rhinitis     Clinical Impressions Pt typically ambulates with SPC and R AFO, he does his own bathing, gets assist for LB dressing from his wife, and his wife does IADL. He was able to recall 3/3 back precautions from earlier PT session. Initiated education with ADL focus including verbal introductions ofof AE. Today he is overall mod to max A for all aspects of ADL (requires seated position currently for safety with grooming and bathing). He performed transfers with mod A (step pivot only today) and VSS on 3L O2. At this time recommend post-acute OT at rehab of >3 hours daily to maximize safety and independence in ADL and functional transfers. Wife present and supportive throughout and can provide 24/7 care.      If plan is discharge home, recommend the following:   Two people to help with walking and/or transfers;A lot of help with bathing/dressing/bathroom;Assistance with cooking/housework;Assist for transportation;Help with stairs or ramp for entrance     Functional Status Assessment   Patient has had a recent decline in their functional status and demonstrates the ability to make significant improvements in  function in a reasonable and predictable amount of time.     Equipment Recommendations   Other (comment) (defer to next venue)     Recommendations for Other Services   Rehab consult;PT consult     Precautions/Restrictions   Precautions Precautions: Back Precaution Booklet Issued: Yes (comment) Recall of Precautions/Restrictions: Intact Precaution/Restrictions Comments: able to state 3/3 precautions at end of session after education Required Braces or Orthoses: Spinal Brace;Other Brace Spinal Brace: Lumbar corset;Applied in sitting position (bathroom privledges) Other Brace: AFO RLE Restrictions Weight Bearing Restrictions Per Provider Order: No     Mobility Bed Mobility Overal bed mobility: Needs Assistance Bed Mobility: Rolling, Sit to Sidelying Rolling: Min assist, Used rails       Sit to sidelying: Mod assist, Used rails General bed mobility comments: cues for sequencing prior to returning to bed. Min assist for trunk support and min A for BLE into bed    Transfers Overall transfer level: Needs assistance Equipment used: 1 person hand held assist Transfers: Sit to/from Stand, Bed to chair/wheelchair/BSC Sit to Stand: Mod assist     Step pivot transfers: Mod assist     General transfer comment: Mod assist for boost and balance, with posterior lean noted. Hand held support to rise. Step pivot transfer from recliner to bed with significant Rt foot drop, compensatory hip flexion for adequate clearance. Cues for hand placement rising and sitting.      Balance Overall balance assessment: Needs assistance Sitting-balance support: No upper extremity supported, Feet supported Sitting balance-Leahy Scale: Fair     Standing balance support: Single extremity supported Standing balance-Leahy Scale: Poor  ADL either performed or assessed with clinical judgement   ADL Overall ADL's : Needs  assistance/impaired Eating/Feeding: Set up   Grooming: Set up;Sitting Grooming Details (indicate cue type and reason): limited by A line in dominant hand, asking RN and staff to assist with hand to mouth bc of this Upper Body Bathing: Moderate assistance   Lower Body Bathing: Maximal assistance   Upper Body Dressing : Maximal assistance Upper Body Dressing Details (indicate cue type and reason): doffing brace Lower Body Dressing: Maximal assistance Lower Body Dressing Details (indicate cue type and reason): doff socks, unable to perform figure 4 Toilet Transfer: Moderate assistance;Stand-pivot (1 person HHA) Toilet Transfer Details (indicate cue type and reason): face to face simulated through return to bed Toileting- Clothing Manipulation and Hygiene: Maximal assistance;Bed level       Functional mobility during ADLs: Moderate assistance (1 person HHA/face to face)       Vision Baseline Vision/History: 1 Wears glasses Ability to See in Adequate Light: 0 Adequate Patient Visual Report: No change from baseline       Perception         Praxis         Pertinent Vitals/Pain Pain Assessment Pain Assessment: Faces Faces Pain Scale: Hurts even more Pain Location: back Pain Descriptors / Indicators: Aching, Operative site guarding Pain Intervention(s): Monitored during session, Repositioned, Premedicated before session     Extremity/Trunk Assessment Upper Extremity Assessment Upper Extremity Assessment: Generalized weakness   Lower Extremity Assessment Lower Extremity Assessment: Defer to PT evaluation   Cervical / Trunk Assessment Cervical / Trunk Assessment: Back Surgery   Communication Communication Communication: Impaired Factors Affecting Communication: Hearing impaired   Cognition Arousal: Alert Behavior During Therapy: WFL for tasks assessed/performed Cognition: No apparent impairments             OT - Cognition Comments: tangential                  Following commands: Intact       Cueing  General Comments   Cueing Techniques: Verbal cues  SpO2 WFL throughout session on 3L O2 via [place], wife present throughout session   Exercises     Shoulder Instructions      Home Living Family/patient expects to be discharged to:: Private residence Living Arrangements: Spouse/significant other Available Help at Discharge: Family;Available 24 hours/day Type of Home: House Home Access: Stairs to enter [employer] of Steps: 1 Entrance Stairs-Rails: Left;Right Home Layout: One level     Bathroom Shower/Tub: Producer, television/film/video: Standard Bathroom Accessibility: Yes How Accessible: Accessible via walker Home Equipment: Rolling Walker (2 wheels);Cane - single point;Shower [employer] operational officer (4 wheels)          Prior Functioning/Environment Prior Level of Function : Needs assist             Mobility Comments: Ambulating with RW, Fall earlier this year resulting in Rt hip fracture ADLs Comments: Needs assist for LB dressing, wife does IADL    OT Problem List: Decreased strength;Decreased range of motion;Decreased activity tolerance;Impaired balance (sitting and/or standing);Decreased safety awareness;Decreased knowledge of precautions;Decreased knowledge of use of DME or AE;Cardiopulmonary status limiting activity;Pain   OT Treatment/Interventions: Self-care/ADL training;Energy conservation;Therapeutic exercise;Therapeutic activities;Patient/family education;Balance training      OT Goals(Current goals can be found in the care plan section)   Acute Rehab OT Goals Patient Stated Goal: get back to walking without pain OT Goal Formulation: With patient Time For Goal Achievement: 09/18/23 Potential to Achieve Goals:  Good ADL Goals Pt Will Perform Grooming: sitting;with modified independence Pt Will Perform Upper Body Dressing: with supervision;sitting Pt Will Perform Lower Body Dressing:  with min assist;sit to/from stand;with adaptive equipment;with caregiver independent in assisting Pt Will Transfer to Toilet: with contact guard assist;ambulating Pt Will Perform Toileting - Clothing Manipulation and hygiene: with contact guard assist;sitting/lateral leans;with caregiver independent in assisting Additional ADL Goal #1: Pt will verbalize and maintain back precautions during ADL routine with no cues   OT Frequency:  Min 2X/week    Co-evaluation              AM-PAC OT 6 Clicks Daily Activity     Outcome Measure Help from another person eating meals?: None Help from another person taking care of personal grooming?: A Little Help from another person toileting, which includes using toliet, bedpan, or urinal?: A Lot Help from another person bathing (including washing, rinsing, drying)?: A Lot Help from another person to put on and taking off regular upper body clothing?: A Lot Help from another person to put on and taking off regular lower body clothing?: A Lot 6 Click Score: 15   End of Session Equipment Utilized During Treatment: Gait belt;Back brace;Oxygen (3L) Nurse Communication: Mobility status;Precautions  Activity Tolerance: Patient tolerated treatment well Patient left: in bed;with call bell/phone within reach;with nursing/sitter in room;with SCD's reapplied  OT Visit Diagnosis: Unsteadiness on feet (R26.81);Other abnormalities of gait and mobility (R26.89);Muscle weakness (generalized) (M62.81);History of falling (Z91.81);Other symptoms and signs involving the nervous system (R29.898);Pain Pain - part of body:  (back)                Time: 8957-8880 OT Time Calculation (min): 37 min Charges:  OT General Charges $OT Visit: 1 Visit OT Evaluation $OT Eval Moderate Complexity: 1 Mod OT Treatments $Self Care/Home Management : 8-22 mins  Leita DEL OTR/L Acute Rehabilitation Services Office: [phone]  Leita PARAS [facility] 09/04/2023, 1:47 PM

## 2023-09-05 ENCOUNTER — Inpatient Hospital Stay (HOSPITAL_COMMUNITY)

## 2023-09-05 ENCOUNTER — Encounter (HOSPITAL_COMMUNITY): Payer: Self-pay | Admitting: Neurosurgery

## 2023-09-05 ENCOUNTER — Other Ambulatory Visit: Payer: Self-pay

## 2023-09-05 DIAGNOSIS — I959 Hypotension, unspecified: Secondary | ICD-10-CM

## 2023-09-05 DIAGNOSIS — I5022 Chronic systolic (congestive) heart failure: Secondary | ICD-10-CM | POA: Diagnosis not present

## 2023-09-05 DIAGNOSIS — I4719 Other supraventricular tachycardia: Secondary | ICD-10-CM | POA: Diagnosis not present

## 2023-09-05 DIAGNOSIS — I471 Supraventricular tachycardia, unspecified: Secondary | ICD-10-CM | POA: Diagnosis not present

## 2023-09-05 DIAGNOSIS — S32012A Unstable burst fracture of first lumbar vertebra, initial encounter for closed fracture: Secondary | ICD-10-CM | POA: Diagnosis not present

## 2023-09-05 DIAGNOSIS — I4821 Permanent atrial fibrillation: Secondary | ICD-10-CM | POA: Diagnosis not present

## 2023-09-05 DIAGNOSIS — I2510 Atherosclerotic heart disease of native coronary artery without angina pectoris: Secondary | ICD-10-CM | POA: Diagnosis not present

## 2023-09-05 DIAGNOSIS — M545 Low back pain, unspecified: Secondary | ICD-10-CM

## 2023-09-05 DIAGNOSIS — G4730 Sleep apnea, unspecified: Secondary | ICD-10-CM

## 2023-09-05 DIAGNOSIS — R579 Shock, unspecified: Secondary | ICD-10-CM | POA: Diagnosis not present

## 2023-09-05 DIAGNOSIS — M21371 Foot drop, right foot: Secondary | ICD-10-CM

## 2023-09-05 DIAGNOSIS — D62 Acute posthemorrhagic anemia: Secondary | ICD-10-CM

## 2023-09-05 DIAGNOSIS — G4733 Obstructive sleep apnea (adult) (pediatric): Secondary | ICD-10-CM | POA: Diagnosis not present

## 2023-09-05 DIAGNOSIS — G8929 Other chronic pain: Secondary | ICD-10-CM

## 2023-09-05 LAB — BASIC METABOLIC PANEL WITH GFR
Anion gap: 12 (ref 5–15)
BUN: 22 mg/dL (ref 8–23)
CO2: 26 mmol/L (ref 22–32)
Calcium: 8.3 mg/dL — ABNORMAL LOW (ref 8.9–10.3)
Chloride: 96 mmol/L — ABNORMAL LOW (ref 98–111)
Creatinine, Ser: 1.65 mg/dL — ABNORMAL HIGH (ref 0.61–1.24)
GFR, Estimated: 43 mL/min — ABNORMAL LOW (ref >60)
Glucose, Bld: 142 mg/dL — ABNORMAL HIGH (ref 70–99)
Potassium: 3.9 mmol/L (ref 3.5–5.1)
Sodium: 134 mmol/L — ABNORMAL LOW (ref 135–145)

## 2023-09-05 LAB — COOXEMETRY PANEL
Carboxyhemoglobin: 1.8 % — ABNORMAL HIGH (ref 0.5–1.5)
Carboxyhemoglobin: 1.6 % — ABNORMAL HIGH (ref 0.5–1.5)
Methemoglobin: 1 % (ref 0.0–1.5)
Methemoglobin: <0.7 % (ref 0.0–1.5)
O2 Saturation: 97.1 %
O2 Saturation: 62.9 %
Total hemoglobin: 12.4 g/dL (ref 12.0–16.0)
Total hemoglobin: 11.1 g/dL — ABNORMAL LOW (ref 12.0–16.0)

## 2023-09-05 LAB — CBC
HCT: 30.8 % — ABNORMAL LOW (ref 39.0–52.0)
Hemoglobin: 10.3 g/dL — ABNORMAL LOW (ref 13.0–17.0)
MCH: 31.1 pg (ref 26.0–34.0)
MCHC: 33.4 g/dL (ref 30.0–36.0)
MCV: 93.1 fL (ref 80.0–100.0)
Platelets: 172 K/uL (ref 150–400)
RBC: 3.31 MIL/uL — ABNORMAL LOW (ref 4.22–5.81)
RDW: 15.5 % (ref 11.5–15.5)
WBC: 13.3 K/uL — ABNORMAL HIGH (ref 4.0–10.5)
nRBC: 0 % (ref 0.0–0.2)

## 2023-09-05 LAB — LACTIC ACID, PLASMA: Lactic Acid, Venous: 1.3 mmol/L (ref 0.5–1.9)

## 2023-09-05 LAB — ECHOCARDIOGRAM COMPLETE
AV Mean grad: 7 mmHg
AV Peak grad: 10.6 mmHg
Ao pk vel: 1.63 m/s
Calc EF: 33 %
Height: 71 in
S' Lateral: 4 cm
Single Plane A2C EF: 35.2 %
Single Plane A4C EF: 29.2 %
Weight: 2751.34 [oz_av]

## 2023-09-05 LAB — MAGNESIUM: Magnesium: 2.4 mg/dL (ref 1.7–2.4)

## 2023-09-05 MED ORDER — AMIODARONE HCL IN DEXTROSE 360-4.14 MG/200ML-% IV SOLN
60.0000 mg/h | INTRAVENOUS | Status: DC
  Filled 2023-09-05: qty 200

## 2023-09-05 MED ORDER — SODIUM CHLORIDE 0.9 % IV SOLN: 250.0000 mL | INTRAVENOUS | Status: AC

## 2023-09-05 MED ORDER — DAPAGLIFLOZIN PROPANEDIOL 5 MG PO TABS
5.0000 mg | ORAL_TABLET | Freq: Every day | ORAL | Status: DC
  Filled 2023-09-05: qty 1

## 2023-09-05 MED ORDER — ALBUMIN HUMAN 25 % IV SOLN
25.0000 g | Freq: Four times a day (QID) | INTRAVENOUS | Status: AC
  Administered 2023-09-05 – 2023-09-06 (×4): 25 g via INTRAVENOUS
  Filled 2023-09-05 (×4): qty 100

## 2023-09-05 MED ORDER — AMIODARONE HCL 200 MG PO TABS
200.0000 mg | ORAL_TABLET | Freq: Two times a day (BID) | ORAL | Status: DC
  Administered 2023-09-05 – 2023-09-10 (×10): 200 mg via ORAL
  Filled 2023-09-05 (×10): qty 1

## 2023-09-05 MED ORDER — SODIUM CHLORIDE 0.9% FLUSH
10.0000 mL | Freq: Two times a day (BID) | INTRAVENOUS | Status: DC
  Administered 2023-09-05 – 2023-09-09 (×9): 10 mL
  Administered 2023-09-10: 20 mL

## 2023-09-05 MED ORDER — AMIODARONE HCL IN DEXTROSE 360-4.14 MG/200ML-% IV SOLN: 30.0000 mg/h | INTRAVENOUS | Status: DC

## 2023-09-05 MED ORDER — METOPROLOL TARTRATE 50 MG PO TABS: 50.0000 mg | ORAL_TABLET | Freq: Two times a day (BID) | ORAL | Status: DC

## 2023-09-05 MED ORDER — SODIUM CHLORIDE 0.9% FLUSH: 10.0000 mL | INTRAVENOUS | Status: DC | PRN

## 2023-09-05 MED ORDER — NOREPINEPHRINE 4 MG/250ML-% IV SOLN: 0.0000 ug/min | INTRAVENOUS | Status: DC

## 2023-09-05 MED ORDER — LACTATED RINGERS IV BOLUS
500.0000 mL | Freq: Once | INTRAVENOUS | Status: AC
  Administered 2023-09-05: 500 mL via INTRAVENOUS

## 2023-09-05 NOTE — Progress Notes (Signed)
 Physical Therapy Treatment Patient Details Name: Aaron Richmond MRN: [id number] DOB: September 02, 1945 Today's Date: 09/05/2023   History of Present Illness Pt is a 78 y/o male admitted 09/03/23 for planned transpedicular decompression of L1 instrumented fusion from T10-L4. During Sx Pt noted to be in a narrow complex tachycardia and hypotension requiring pressor support and cardioversion x2 and one shock. PMH includes Aneurysm of aortic root, Aortic atherosclerosis, Aortic regurgitation, Aortic valve insufficiency, Ascending aortic aneurysm, Atherosclerotic vascular disease, Atrial fibrillation, CHF, Closed fracture of right hip, Dysrhythmia, HTN, Gastrointestinal hemorrhage, Heart murmur, History of cardiomyopathy, PE, Lumbar radiculopathy, OSA, Prostate cancer metastatic to bone, Rhinitis    PT Comments  Worked on transfer training, reviewed precautions, repositioning, and symptom awareness. Pt tolerated standing x2 from recliner. Mod assist to scoot, boost, and balance. Heavy retropulsion which improved with cues for technique. Pt dizzy upon standing, preventing ambulation. On second stand, able to obtain more accurate BP measurement which indicated MAP dropped to 60. Returned to chair with feet elevated, and trunk modestly reclined, MAP back up to 65, symptoms improved. RN Notified. Patient will continue to benefit from skilled physical therapy services to further improve independence with functional mobility.     If plan is discharge home, recommend the following: A lot of help with walking and/or transfers;A lot of help with bathing/dressing/bathroom;Assistance with cooking/housework;Assist for transportation;Help with stairs or ramp for entrance   Can travel by private vehicle        Equipment Recommendations  None recommended by PT    Recommendations for Other Services Rehab consult     Precautions / Restrictions Precautions Precautions: Back Precaution Booklet Issued: Yes  (comment) Recall of Precautions/Restrictions: Intact Precaution/Restrictions Comments: able to state 3/3 precautions at end of session after education Required Braces or Orthoses: Spinal Brace;Other Brace Spinal Brace: Lumbar corset;Applied in sitting position (bathroom privledges) Other Brace: AFO RLE Restrictions Weight Bearing Restrictions Per Provider Order: No     Mobility  Bed Mobility               General bed mobility comments: in recliner when PT entered room    Transfers Overall transfer level: Needs assistance Equipment used: Standard walker Transfers: Sit to/from Stand Sit to Stand: Mod assist           General transfer comment: Mod assist for boost to stand with cues for hand placement and hip hinge to maintain spinal precautions. Heavy retropulsion present, fear of falling forward, gradually improves but limited by dizziness. Needs assist to control descent into chair. Mod assis to scoot to [place] of chair.    Ambulation/Gait             Pre-gait activities: Static march, limited, mod assist.     Stairs             Wheelchair Mobility     Tilt Bed    Modified Rankin (Stroke Patients Only)       Balance Overall balance assessment: Needs assistance Sitting-balance support: Feet supported, Single extremity supported Sitting balance-Leahy Scale: Poor Sitting balance - Comments: requires UE support in chair   Standing balance support: Single extremity supported Standing balance-Leahy Scale: Poor                              Communication Communication Communication: Impaired Factors Affecting Communication: Hearing impaired  Cognition Arousal: Alert Behavior During Therapy: WFL for tasks assessed/performed   PT - Cognitive impairments: No apparent impairments  Following commands: Intact      Cueing Cueing Techniques: Verbal cues  Exercises General Exercises - Lower  Extremity Ankle Circles/Pumps: AROM, Both, 10 reps, Supine (unable to DF Rt.)    General Comments General comments (skin integrity, edema, etc.): First stand, pt dizzy BP indicated 135/89 but I believe this was not accurate. Second stand, still dizzy BP cuff indicated 61/42 (MAP 49) this is likely correct.  After sitting down and elevating LEs, chair reclined modestly BP 98/49 (MAP 65) RN notified. SPO2 92% and greater on RA during session. Back on 3L as found.      Pertinent Vitals/Pain Pain Assessment Pain Assessment: Faces Faces Pain Scale: Hurts even more Pain Location: back Pain Descriptors / Indicators: Aching, Operative site guarding Pain Intervention(s): Limited activity within patient's tolerance, Monitored during session, Premedicated before session, Repositioned    Home Living                          Prior Function            PT Goals (current goals can now be found in the care plan section) Acute Rehab PT Goals Patient Stated Goal: Get well, return home PT Goal Formulation: With patient Time For Goal Achievement: 09/18/23 Potential to Achieve Goals: Good Progress towards PT goals: Progressing toward goals    Frequency    Min 3X/week      PT Plan      Co-evaluation              AM-PAC PT 6 Clicks Mobility   Outcome Measure  Help needed turning from your back to your side while in a flat bed without using bedrails?: A Little Help needed moving from lying on your back to sitting on the side of a flat bed without using bedrails?: A Little Help needed moving to and from a bed to a chair (including a wheelchair)?: A Lot Help needed standing up from a chair using your arms (e.g., wheelchair or bedside chair)?: A Lot Help needed to walk in hospital room?: A Lot Help needed climbing 3-5 steps with a railing? : Total 6 Click Score: 13    End of Session Equipment Utilized During Treatment: Gait belt;Back brace;Oxygen Activity Tolerance:  Treatment limited secondary to medical complications (Comment) (hypotension) Patient left: in chair;with call bell/phone within reach;with chair alarm set Nurse Communication: Mobility status;Precautions PT Visit Diagnosis: Unsteadiness on feet (R26.81);Other abnormalities of gait and mobility (R26.89);Muscle weakness (generalized) (M62.81);History of falling (Z91.81);Difficulty in walking, not elsewhere classified (R26.2);Other symptoms and signs involving the nervous system (R29.898);Pain Pain - part of body:  (back)     Time: 9154-9062 PT Time Calculation (min) (ACUTE ONLY): 52 min  Charges:    $Therapeutic Activity: 38-52 mins                       Leontine Roads, PT, DPT [facility]  Rehabilitation Services Physical Therapist Office: [phone] Website: [email or web address]    Leontine GORMAN Roads 09/05/2023, 9:56 AM

## 2023-09-05 NOTE — Progress Notes (Signed)
 Subjective: Patient reports doing well from a surgical perspective back pain but manageable legs feel better  Objective: Vital signs in last 24 hours: Temp:  [97.5 F (36.4 C)-99.3 F (37.4 C)] 97.5 F (36.4 C) (08/29 0815) Pulse Rate:  [58-92] 85 (08/29 1017) Resp:  [15-30] 23 (08/29 1000) BP: (84-138)/(39-74) 100/51 (08/29 1017) SpO2:  [88 %-97 %] 96 % (08/29 1000) Arterial Line BP: (108-138)/(27-47) 133/47 (08/29 1000)  Intake/Output from previous day: 08/28 0701 - 08/29 0700 In: 1416.9 [I.V.:1116.9; IV Piggyback:300] Out: 4000 [Urine:3830; Drains:170] Intake/Output this shift: Total I/O In: 253.7 [I.V.:153.7; IV Piggyback:100.1] Out: 220 [Urine:220]  Awake and alert baseline right foot drop otherwise 5 out of 5  Lab Results: Recent Labs    09/03/23 1440  HGB 11.2*  HCT 34.0*   BMET Recent Labs    09/03/23 1440 09/05/23 0312  NA 137 134*  K 3.9 3.9  CL 103 96*  CO2 20* 26  GLUCOSE 179* 142*  BUN 20 22  CREATININE 1.23 1.65*  CALCIUM  8.7* 8.3*    Studies/Results: ECHOCARDIOGRAM COMPLETE Result Date: 09/05/2023    ECHOCARDIOGRAM REPORT   Patient Name:   Aaron Richmond Marter Date of Exam: 09/05/2023 Medical Rec #:  [id number]        Height:       71.0 in Accession #:    [id number]       Weight:       172.0 lb Date of Birth:  July 10, 1945        BSA:          1.977 m Patient Age:    78 years         BP:           97/49 mmHg Patient Gender: M                HR:           94 bpm. Exam Location:  Inpatient Procedure: 2D Echo, Cardiac Doppler and Color Doppler (Both Spectral and Color            Flow Doppler were utilized during procedure). Indications:    CHF  History:        Patient has prior history of Echocardiogram examinations, most                 recent 07/04/2023. Cardiomyopathy, CAD, Aortic Valve Disease,                 Arrythmias:Atrial Fibrillation; Risk Factors:Hypertension.  Sonographer:    Jayson Gaskins Referring Phys: [phone] DALTON S MCLEAN IMPRESSIONS  1. Left  ventricular ejection fraction, by estimation, is 30 to 35%. Left ventricular ejection fraction by 2D MOD biplane is 33.0 %. The left ventricle has moderately decreased function. The left ventricle demonstrates global hypokinesis. Left ventricular diastolic function could not be evaluated.  2. Right ventricular systolic function is mildly reduced. The right ventricular size is normal.  3. Left atrial size was moderately dilated.  4. Right atrial size was mildly dilated.  5. The mitral valve is degenerative. Mild mitral valve regurgitation.  6. The aortic valve was not well visualized. Aortic valve regurgitation is moderate.  7. Aortic dilatation noted. There is severe dilatation of the ascending aorta, measuring 56 mm.  8. Rhythm strip during this exam demonstrates premature ventricular contractions and atrial fibrillation. Comparison(s): Changes from prior study are noted. 07/04/2023: LVEF 35-40%. Conclusion(s)/Recommendation(s): Critical findings reported to Dr. Rolan and acknowledged at 09/05/2023 at 1:05 pm. FINDINGS  Left  Ventricle: Left ventricular ejection fraction, by estimation, is 30 to 35%. Left ventricular ejection fraction by 2D MOD biplane is 33.0 %. The left ventricle has moderately decreased function. The left ventricle demonstrates global hypokinesis. The left ventricular internal cavity size was normal in size. There is no left ventricular hypertrophy. Left ventricular diastolic function could not be evaluated due to atrial fibrillation. Left ventricular diastolic function could not be evaluated. Right Ventricle: The right ventricular size is normal. No increase in right ventricular wall thickness. Right ventricular systolic function is mildly reduced. Left Atrium: Left atrial size was moderately dilated. Right Atrium: Right atrial size was mildly dilated. Pericardium: There is no evidence of pericardial effusion. Mitral Valve: The mitral valve is degenerative in appearance. There is mild  calcification of the anterior and posterior mitral valve leaflet(s). Mild mitral valve regurgitation. Tricuspid Valve: The tricuspid valve is grossly normal. Tricuspid valve regurgitation is mild. Aortic Valve: The aortic valve was not well visualized. Aortic valve regurgitation is moderate. Aortic valve mean gradient measures 7.0 mmHg. Aortic valve peak gradient measures 10.6 mmHg. Pulmonic Valve: The pulmonic valve was normal in structure. Pulmonic valve regurgitation is not visualized. Aorta: Aortic dilatation noted. There is severe dilatation of the ascending aorta, measuring 56 mm. IAS/Shunts: No atrial level shunt detected by color flow Doppler. EKG: Rhythm strip during this exam demonstrates premature ventricular contractions and atrial fibrillation.  LEFT VENTRICLE PLAX 2D                        Biplane EF (MOD) LVIDd:         5.00 cm         LV Biplane EF:   Left LVIDs:         4.00 cm                          ventricular LV PW:         0.80 cm                          ejection LV IVS:        0.90 cm                          fraction by                                                 2D MOD                                                 biplane is LV Volumes (MOD)                                33.0 %. LV vol d, MOD    189.4 ml A2C:                           Diastology LV vol d, MOD    230.1 ml      LV e' lateral: 11.60 cm/s A4C: LV  vol s, MOD    122.7 ml A2C: LV vol s, MOD    163.0 ml A4C: LV SV MOD A2C:   66.7 ml LV SV MOD A4C:   230.1 ml LV SV MOD BP:    69.6 ml RIGHT VENTRICLE RV S prime:     17.30 cm/s LEFT ATRIUM           Index LA Vol (A2C): 68.5 ml 34.64 ml/m LA Vol (A4C): 91.9 ml 46.47 ml/m  AORTIC VALVE AV Vmax:           163.00 cm/s AV Vmean:          128.000 cm/s AV VTI:            0.279 m AV Peak Grad:      10.6 mmHg AV Mean Grad:      7.0 mmHg LVOT Vmax:         132.00 cm/s LVOT Vmean:        106.000 cm/s LVOT VTI:          0.233 m LVOT/AV VTI ratio: 0.84  SHUNTS Systemic VTI: 0.23 m  Vinie Maxcy MD Electronically signed by Vinie Maxcy MD Signature Date/Time: 09/05/2023/1:07:50 PM    Final    US  EKG SITE RITE Result Date: 09/05/2023 If Site Rite image not attached, placement could not be confirmed due to current cardiac rhythm.   Assessment/Plan: Postop day 2 T10-L4 fusion with transpedicular decompression L1 still in the cardiac ICU he is off all his drips currently but systolic pressures around 105 he is being followed by cardiology service and CCM.  Mobilize with physical therapy throughout the week and patient to be moved out of the ICU when cleared by cardiology.  LOS: 2 days     Aaron Richmond 09/05/2023, 1:34 PM

## 2023-09-05 NOTE — Consult Note (Addendum)
 NAME:  Aaron Richmond, MRN:  [id number], DOB:  [birth year], LOS: 2 ADMISSION DATE:  09/03/2023, CONSULTATION DATE:  09/05/23 REFERRING MD:  Onetha, CHIEF COMPLAINT:  shock    History of Present Illness:  78 yo M PMH Afib, HFrEF, thoracic aortic aneurysm, CAD admitted to NSGY service 8/27 for transpedicular decompression L1, instrumented fusion T10-L4 for severe L1 cord compression / burst fracture. Preceding sx RLE numbness/tingling+ back pain.   Sounds like case was pretty unremarkable. Post op significant for unstable SVT. Cards consulted. Failed cardioversion x2 at 200J, failed adenosine 6 & 12. Failed add'l 200J CV attempt, and converted to Afib RVR with 360J CV, with improvement in blood pressures   Pertinent  Medical History   Afib HFrEF  CAD Kyphosis   Significant Hospital Events: Including procedures, antibiotic start and stop dates in addition to other pertinent events   8/27 planned L1 decompression T10-L4 instrumentation fusion. Post op unstable SVT cards consulted  8/28 remained on 10 NE 8/29 remained on NE. Ep signed off. Pharmacy/nursing notified PCCM and requested consultation   Interim History / Subjective:  This is day 3 requiring pressors. On 12 NE overnight, now down to 4 There is no CBC this admission BMP this morning with AKI Cr 1.65   Objective    Blood pressure (!) 100/51, pulse 85, temperature (!) 97.5 F (36.4 C), temperature source Oral, resp. rate (!) 23, height 5' 11 (1.803 m), weight 78 kg, SpO2 96%.        Intake/Output Summary (Last 24 hours) at 09/05/2023 1207 Last data filed at 09/05/2023 1023 Gross per 24 hour  Intake 1017.71 ml  Output 3870 ml  Net -2852.29 ml   Filed Weights   09/03/23 0601 09/04/23 0555  Weight: 76.2 kg 78 kg    Examination: General: chronically ill appearing older adult M  HENT: NCAT  Lungs: even unlabored  Cardiovascular: irregular appearing, some pvcs. Cap refill is sluggish  Abdomen: round  Extremities: no  acute joint deformity  Neuro: AAOx4  GU: defer  Resolved problem list   Assessment and Plan   L1 burst fx and cord compression S/p thoracolumbar fusion -T10-L4 fusion  P -post op per NSGY   Shock  -unclear etiology. Initially unstable SVT seemed plausible but remained on pressors after converting to Fib, as well as sinus -afebrile, nontoxic appearing -T10-L4 surgery -- wouldn't be most typical for neurogenic  P -add on CBC -follow fever curve -- low threshold abx but defer for now -check LA -ECHO -PICC -coox and cvp monitoring when picc placed  -I am going to re-order the NE. Currently ordered is not c/w our PIV range.  -wean as able. Has low diastolics, might be reasonable to target a SBP goal in lieu of MAP. Will try SBP goal 100 and see how he does  Unstable SVT Afib (chronic, on home xarelto )  Chronic HFrEF Mild-mod  mitral regurg CAD P -amio  -w recent back surgery not on systemic AC. Takes xarelto  at home -cont statin -holding entresto , beta blocker. Is on home torsemide  farxiga  -We have asked Adv HF to consult   Severe OSA  -BiPAP dependent at home w central apneas (home settings 19/15)  P -ordering for at bedtime BiPAP. Hasn't been on this at all in the hospital it seems.   Labs   CBC: Recent Labs  Lab 08/29/23 1335 09/03/23 1440  WBC 7.3  --   HGB 13.5 11.2*  HCT 42.3 34.0*  MCV 95.1  --  PLT 232  --     Basic Metabolic Panel: Recent Labs  Lab 09/03/23 1440 09/05/23 0312  NA 137 134*  K 3.9 3.9  CL 103 96*  CO2 20* 26  GLUCOSE 179* 142*  BUN 20 22  CREATININE 1.23 1.65*  CALCIUM  8.7* 8.3*  MG 1.8 2.4  PHOS 4.1  --    GFR: Estimated Creatinine Clearance: 39.9 mL/min (A) (by C-G formula based on SCr of 1.65 mg/dL (H)). Recent Labs  Lab 08/29/23 1335  WBC 7.3    Liver Function Tests: No results for input(s): AST, ALT, ALKPHOS, BILITOT, PROT, ALBUMIN  in the last 168 hours. No results for input(s): LIPASE, AMYLASE  in the last 168 hours. No results for input(s): AMMONIA in the last 168 hours.  ABG No results found for: PHART, PCO2ART, PO2ART, HCO3, TCO2, ACIDBASEDEF, O2SAT   Coagulation Profile: No results for input(s): INR, PROTIME in the last 168 hours.  Cardiac Enzymes: No results for input(s): CKTOTAL, CKMB, CKMBINDEX, TROPONINI in the last 168 hours.  HbA1C: Hgb A1c MFr Bld  Date/Time Value Ref Range Status  12/11/2022 02:42 PM 6.0 (H) 4.8 - 5.6 % Final    Comment:             Prediabetes: 5.7 - 6.4          Diabetes: >6.4          Glycemic control for adults with diabetes: <7.0   06/14/2022 11:55 AM 6.2 (H) 4.8 - 5.6 % Final    Comment:             Prediabetes: 5.7 - 6.4          Diabetes: >6.4          Glycemic control for adults with diabetes: <7.0     CBG: No results for input(s): GLUCAP in the last 168 hours.  Review of Systems:   Review of Systems  Constitutional: Negative.   Respiratory:  Negative for shortness of breath.   Cardiovascular:  Negative for chest pain.  Musculoskeletal:  Positive for back pain.  Neurological:  Negative for sensory change.     Past Medical History:  He,  has a past medical history of Aneurysm of aortic root, Aortic atherosclerosis (HCC) (06/14/2022), Aortic regurgitation, Aortic valve insufficiency, Ascending aortic aneurysm (HCC) (09/11/2016), Atherosclerotic vascular disease (05/27/2022), Atrial fibrillation (HCC), Atrial fibrillation with RVR (HCC) (07/05/2022), BMI 27.0-27.9,adult (06/14/2022), BPH (benign prostatic hyperplasia), BRBPR (bright red blood per rectum) (05/20/2023), Cardiomyopathy (HCC) (09/11/2016), CHF (congestive heart failure) (HCC), CKD (chronic kidney disease) stage 2, GFR 60-89 ml/min (11/23/2021), Closed fracture of right hip (HCC) (03/10/2023), Coronary artery calcification seen on CT scan (10/13/2017), Dysrhythmia, Essential hypertension (06/14/2022), Gastrointestinal hemorrhage  (05/20/2023), Heart murmur, High risk medication use (07/31/2022), History of cardiomyopathy (11/09/2021), History of pulmonary embolism (09/24/2012), Hypercholesterolemia (06/14/2022), Hyperlipidemia, Hypertension, 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), Lumbar radiculopathy (01/24/2022), Malignant neoplasm of prostate (HCC) (10/04/2020), Malignant neoplasm of prostate metastatic to bone (HCC) (10/04/2020), Mitral valve insufficiency (06/14/2022), Obesity (BMI 30-39.9) (10/16/2017), Obstructive sleep apnea syndrome (09/24/2012), OSA (obstructive sleep apnea) (09/13/2022), Prediabetes, Primary osteoarthritis involving multiple joints (06/14/2022), Prostate cancer metastatic to bone (HCC) (06/14/2022), Pulmonary embolism (HCC), Rhinitis, Sacral wound (04/11/2023), Seasonal allergies (06/14/2022), Spondylolisthesis of lumbar region (12/12/2021), Stage 3a chronic kidney disease (HCC) (06/14/2022), and Unstageable pressure ulcer of sacral region (HCC) (03/10/2023).   Surgical History:   Past Surgical History:  Procedure Laterality Date   APPENDECTOMY     COLONOSCOPY N/A 05/23/2023   Procedure: COLONOSCOPY;  Surgeon: Elicia Claw, MD;  Location: [facility];  Service: Gastroenterology;  Laterality: N/A;   HERNIA REPAIR     LAMINECTOMY WITH POSTERIOR LATERAL ARTHRODESIS LEVEL 4 N/A 09/03/2023   Procedure: Posterior lateral fusion - Thoracic Ten-Thoracic Eleven - Thoracic Eleven -Thoracic Twelve - Thoracic Twelve-Lumbar One - Lumbar One-Lumbar Two - Lumbar Two-Lumbar Three - Lumbar Three-Lumbar Four, laminectomy and foraminotomy transpedicular decompression Lumbar One;  Surgeon: Onetha Kuba, MD;  Location: [facility];  Service: Neurosurgery;  Laterality: N/A;  Posterior lateral fusion - T10-T11    LUMBAR LAMINECTOMY  02/06/2022   Facetectom & foraminotomy for decompression of the cauda equina & nerve root L4-5, Arley PHEBE Dark, MD   TONSILLECTOMY       Social History:   reports  that he has never smoked. He has never used smokeless tobacco. He reports that he does not drink alcohol  and does not use drugs.   Family History:  His family history includes Alzheimer's disease in his mother; Diabetes Mellitus II in his father; Heart attack in his mother; Stroke in his father and mother.   Allergies Allergies  Allergen Reactions   Cipro [Ciprofloxacin Hcl] Other (See Comments)    Increased risk of rupture  of ascending thoracic aortic aneurysm     Home Medications  Prior to Admission medications   Medication Sig Start Date End Date Taking? Authorizing Provider  acetaminophen  (TYLENOL ) 500 MG tablet Take 1,000 mg by mouth 2 (two) times daily.   Yes [provider]  methocarbamol  (ROBAXIN ) 500 MG tablet Take 500 mg by mouth every 6 (six) hours as needed for muscle spasms.   Yes [provider]  metoprolol  tartrate (LOPRESSOR ) 50 MG tablet TAKE 1 & 1/2 (ONE & ONE-HALF) TABLETS BY MOUTH TWICE DAILY 09/01/23  Yes Van Eyk, Jason, MD  polyethylene glycol (MIRALAX  / GLYCOLAX ) 17 g packet Take 17 g by mouth daily as needed for mild constipation. 05/23/23  Yes Vann, Jessica U, DO  potassium chloride  SA (KLOR-CON  M20) 20 MEQ tablet Take 20 mEq by mouth daily in the afternoon.   Yes [provider]  rosuvastatin  (CRESTOR ) 5 MG tablet Take 1 tablet by mouth once daily 06/24/23  Yes Munley, Redell PARAS, MD  sacubitril -valsartan  (ENTRESTO ) 24-26 MG Take 1 tablet by mouth 2 (two) times daily. 08/11/23  Yes Monetta Redell PARAS, MD  tamsulosin  (FLOMAX ) 0.4 MG CAPS capsule TAKE 1 CAPSULE BY MOUTH TWICE DAILY 30 MINUTES FOLLOWING  THE  SAME  MEAL  EACH  DAY 08/18/23  Yes Fleeta Valeria Mayo, MD  torsemide  (DEMADEX ) 20 MG tablet Take 1 tablet by mouth twice daily 06/23/23  Yes Fleeta Valeria Mayo, MD  traMADol  (ULTRAM ) 50 MG tablet Take 2 tablets (100 mg total) by mouth every 8 (eight) hours as needed. 07/14/23  Yes Fleeta Valeria Mayo, MD  FARXIGA  5 MG TABS tablet Take 1 tablet by mouth once  daily Patient not taking: Reported on 09/04/2023 07/28/23   Van Eyk, Jason, MD  Methylcobalamin (METHYL B-12 PO) Take 5,000 mcg by mouth every evening. Patient not taking: Reported on 09/04/2023    [provider]  Multiple Vitamins-Minerals (MENS 50+ MULTIVITAMIN) TABS Take 1 tablet by mouth every evening. Patient not taking: Reported on 09/04/2023    [provider]  rivaroxaban  (XARELTO ) 20 MG TABS tablet Take 1 tablet (20 mg total) by mouth daily with supper. Patient not taking: Reported on 09/04/2023 05/25/23   Vann, Jessica U, DO     Critical care time: 46 min  CRITICAL CARE Performed by: Ronnald FORBES Gave   Total critical care time: 46 minutes  Critical care time was exclusive of separately billable procedures and treating other patients. Critical care was necessary to treat or prevent imminent or life-threatening deterioration.  Critical care was time spent personally by me on the following activities: development of treatment plan with patient and/or surrogate as well as nursing, discussions with consultants, evaluation of patient's response to treatment, examination of patient, obtaining history from patient or surrogate, ordering and performing treatments and interventions, ordering and review of laboratory studies, ordering and review of radiographic studies, pulse oximetry and re-evaluation of patient's condition.  Ronnald Gave MSN, AGACNP-BC [facility] Amion for pager 09/05/2023, 12:07 PM

## 2023-09-05 NOTE — Progress Notes (Signed)
 Peripherally Inserted Central Catheter Placement  The IV Nurse has discussed with the patient and/or persons authorized to consent for the patient, the purpose of this procedure and the potential benefits and risks involved with this procedure.  The benefits include less needle sticks, lab draws from the catheter, and the patient may be discharged home with the catheter. Risks include, but not limited to, infection, bleeding, blood clot (thrombus formation), and puncture of an artery; nerve damage and irregular heartbeat and possibility to perform a PICC exchange if needed/ordered by physician.  Alternatives to this procedure were also discussed.  Bard Power PICC patient education guide, fact sheet on infection prevention and patient information card has been provided to patient /or left at bedside.    PICC Placement Documentation  PICC Triple Lumen 09/05/23 Right Brachial 38 cm 0 cm (Active)  Indication for Insertion or Continuance of Line Vasoactive infusions 09/05/23 1505  Exposed Catheter (cm) 0 cm 09/05/23 1505  Site Assessment Clean, Dry, Intact 09/05/23 1505  Lumen #1 Status Saline locked;Blood return noted 09/05/23 1505  Lumen #2 Status Saline locked;Blood return noted 09/05/23 1505  Lumen #3 Status Saline locked;Blood return noted 09/05/23 1505  Dressing Type Transparent;Securing device 09/05/23 1505  Dressing Status Antimicrobial disc/dressing in place 09/05/23 1505  Line Care Connections checked and tightened 09/05/23 1505  Line Adjustment (NICU/IV Team Only) No 09/05/23 1505  Dressing Intervention New dressing 09/05/23 1505  Dressing Change Due 09/12/23 09/05/23 1505   Consent signed by wife at bedside, per pt request.    Aaron Richmond 09/05/2023, 3:07 PM

## 2023-09-05 NOTE — Consult Note (Addendum)
 Advanced Heart Failure Team Consult Note   Primary Physician: Fleeta Valeria Mayo, MD Cardiologist:  Dr. Monetta AHF Consulting: Dr. Rolan  Reason for Consultation: Hypotension  HPI:    Aaron Richmond is seen today for evaluation of hypotension requiring vasopressor support in setting of known HFrEF at the request of Dr. Claudene with PCCM. 78 yo male with history of permanent AF, HFrEF, hx thoracic aortic aneurysm  followed by TCTS, and nonobstructive CAD (coronary CTA with nonobstructive disease in LAD and diagonal branches, calcium  score 1959), Other history includes severe central and obstructive sleep apnea on BiPAP, remote hx PE, prostate cancer w/ mets to bone.  He follows with Dr. Monetta in [place].  EF was 50-55% by echo in 10/23. EF slightly lower at 40-45% on echo at [facility] 06/24. Most recent echo 06/25 with EF 35-40%, RV okay, severe LAE, mild to moderate MR, moderate AI, ascending aorta measures 56 mm.  He presented for bilateral transpedicular decompression of L1 for L1 burst fracture with cord compression on 09/03/23. Post op had unstable SVT and required pressor support. Cardiology evaluated patient and he failed cardioversion X 2. EP then called. He failed adenosine 6 and 12, and another cardioversion. Started on IV amiodarone  and converted to AF with RVR after 360 J shock with [place]. He was transferred to the ICU on pressor support and IV amiodarone . He had no recurrent rhythm issues and was later switched to po amiodarone .  He has remained on norepinephrine , have been unable to titrate off due to low MAP. Advanced Heart Failure asked to see to assist with management.  Patient seen after working with PT. Having back pain with position changes. No shortness of breath or chest pain. Does note some lightheadedness with position changes.   Home Medications Prior to Admission medications   Medication Sig Start Date End Date Taking? Authorizing Provider  acetaminophen  (TYLENOL )  500 MG tablet Take 1,000 mg by mouth 2 (two) times daily.   Yes [provider]  methocarbamol  (ROBAXIN ) 500 MG tablet Take 500 mg by mouth every 6 (six) hours as needed for muscle spasms.   Yes [provider]  metoprolol  tartrate (LOPRESSOR ) 50 MG tablet TAKE 1 & 1/2 (ONE & ONE-HALF) TABLETS BY MOUTH TWICE DAILY 09/01/23  Yes Van Eyk, Jason, MD  polyethylene glycol (MIRALAX  / GLYCOLAX ) 17 g packet Take 17 g by mouth daily as needed for mild constipation. 05/23/23  Yes Vann, Jessica U, DO  potassium chloride  SA (KLOR-CON  M20) 20 MEQ tablet Take 20 mEq by mouth daily in the afternoon.   Yes [provider]  rosuvastatin  (CRESTOR ) 5 MG tablet Take 1 tablet by mouth once daily 06/24/23  Yes Munley, Redell PARAS, MD  sacubitril -valsartan  (ENTRESTO ) 24-26 MG Take 1 tablet by mouth 2 (two) times daily. 08/11/23  Yes Monetta Redell PARAS, MD  tamsulosin  (FLOMAX ) 0.4 MG CAPS capsule TAKE 1 CAPSULE BY MOUTH TWICE DAILY 30 MINUTES FOLLOWING  THE  SAME  MEAL  EACH  DAY 08/18/23  Yes Fleeta Valeria Mayo, MD  torsemide  (DEMADEX ) 20 MG tablet Take 1 tablet by mouth twice daily 06/23/23  Yes Fleeta Valeria Mayo, MD  traMADol  (ULTRAM ) 50 MG tablet Take 2 tablets (100 mg total) by mouth every 8 (eight) hours as needed. 07/14/23  Yes Fleeta Valeria Mayo, MD  FARXIGA  5 MG TABS tablet Take 1 tablet by mouth once daily Patient not taking: Reported on 09/04/2023 07/28/23   Fleeta Valeria Mayo, MD  Methylcobalamin (METHYL B-12 PO)  Take 5,000 mcg by mouth every evening. Patient not taking: Reported on 09/04/2023    [provider]  Multiple Vitamins-Minerals (MENS 50+ MULTIVITAMIN) TABS Take 1 tablet by mouth every evening. Patient not taking: Reported on 09/04/2023    [provider]  rivaroxaban  (XARELTO ) 20 MG TABS tablet Take 1 tablet (20 mg total) by mouth daily with supper. Patient not taking: Reported on 09/04/2023 05/25/23   Vann, Jessica U, DO    Past Medical History: Past Medical History:  Diagnosis Date    Aneurysm of aortic root    Overview:  Last Assessment & Plan:  Planning to see Dr Army for evaluation of 5cm aneurysm.  - will check PFT in prep for possible procedure / SGY   Aortic atherosclerosis (HCC) 06/14/2022   Aortic regurgitation    Aortic valve insufficiency    Ascending aortic aneurysm (HCC) 09/11/2016   Atherosclerotic vascular disease 05/27/2022   Atrial fibrillation (HCC)    Atrial fibrillation with RVR (HCC) 07/05/2022   BMI 27.0-27.9,adult 06/14/2022   BPH (benign prostatic hyperplasia)    BRBPR (bright red blood per rectum) 05/20/2023   Cardiomyopathy (HCC) 09/11/2016   CHF (congestive heart failure) (HCC)    CKD (chronic kidney disease) stage 2, GFR 60-89 ml/min 11/23/2021   Closed fracture of right hip (HCC) 03/10/2023   Coronary artery calcification seen on CT scan 10/13/2017   Dysrhythmia    Essential hypertension 06/14/2022   Gastrointestinal hemorrhage 05/20/2023   Heart murmur    High risk medication use 07/31/2022   History of cardiomyopathy 11/09/2021   History of pulmonary embolism 09/24/2012   Overview:  Last Assessment & Plan:  Hx PE x 2 per notes, ? 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  - needs auto-titration study by [employer] or local company, data to RB to adjust his home device.    OSA (obstructive sleep apnea) 09/13/2022   Prediabetes    Primary osteoarthritis involving  multiple joints 06/14/2022   Prostate cancer metastatic to bone (HCC) 06/14/2022   Pulmonary embolism (HCC)    Rhinitis    Sacral wound 04/11/2023   Seasonal allergies 06/14/2022   Spondylolisthesis of lumbar region 12/12/2021   Stage 3a chronic kidney disease (HCC) 06/14/2022   Unstageable pressure ulcer of sacral region (HCC) 03/10/2023    Past Surgical History: Past Surgical History:  Procedure Laterality Date   APPENDECTOMY     COLONOSCOPY N/A 05/23/2023   Procedure: COLONOSCOPY;  Surgeon: Elicia Claw, MD;  Location: [facility];  Service: Gastroenterology;  Laterality: N/A;   HERNIA REPAIR     LAMINECTOMY WITH POSTERIOR LATERAL ARTHRODESIS LEVEL 4 N/A 09/03/2023   Procedure: Posterior lateral fusion - Thoracic Ten-Thoracic Eleven - Thoracic Eleven -Thoracic Twelve - Thoracic Twelve-Lumbar One - Lumbar One-Lumbar Two - Lumbar Two-Lumbar Three - Lumbar Three-Lumbar Four, laminectomy and foraminotomy transpedicular decompression Lumbar One;  Surgeon: Onetha Kuba, MD;  Location: [facility] OR;  Service: Neurosurgery;  Laterality: N/A;  Posterior lateral fusion - T10-T11    LUMBAR LAMINECTOMY  02/06/2022   Facetectom & foraminotomy for decompression of the cauda equina & nerve root L4-5, Arley PHEBE Dark, MD   TONSILLECTOMY      Family History: Family History  Problem Relation Age of Onset   Stroke Mother    Alzheimer's disease Mother    Heart attack Mother    Stroke Father    Diabetes Mellitus II Father     Social History: Social History   Socioeconomic History   Marital status: Married    Spouse name: Not on file   Number of children: 0   Years of education: Not on file   Highest education level: Not on file  Occupational History   Occupation: Chief Operating Officer  Tobacco Use   Smoking status: Never   Smokeless tobacco: Never  Vaping Use   Vaping status: Never Used  Substance and Sexual Activity   Alcohol  use: No   Drug use: No   Sexual activity: Not on file  Other  Topics Concern   Not on file  Social History Narrative   Not on file   Social Drivers of Health   Financial Resource Strain: Not on file  Food Insecurity: No Food Insecurity (09/03/2023)   Hunger Vital Sign    Worried About Running Out of Food in the Last Year: Never true    Ran Out of Food in the Last Year: Never true  Transportation Needs: No Transportation Needs (09/03/2023)   PRAPARE - Administrator, Civil Service (Medical): No    Lack of Transportation (Non-Medical): No  Physical Activity: Not on file  Stress: Not on file  Social Connections: Socially Integrated (09/03/2023)   Social Connection and Isolation Panel    Frequency of Communication with Friends and Family: More than three times a week    Frequency of Social Gatherings with Friends and Family: Once a week    Attends Religious Services: More than 4 times per year    Active Member of [employer] or Organizations: Yes    Attends Banker Meetings: 1 to 4 times per year    Marital Status: Married    Allergies:  Allergies  Allergen Reactions   Cipro [Ciprofloxacin Hcl] Other (See Comments)    Increased risk of rupture  of ascending thoracic aortic aneurysm    Objective:    Vital Signs:   Temp:  [97.5 F (36.4 C)-99.3 F (37.4 C)] 97.5 F (36.4 C) (08/29 0815) Pulse Rate:  [58-103] 85 (08/29 1330) Resp:  [12-32] 23 (08/29 1330) BP: (84-138)/(39-74) 114/53 (08/29 1330) SpO2:  [88 %-97 %] 92 % (08/29 1330) Arterial Line BP: (108-138)/(27-49) 133/45 (08/29 1330) Last BM Date :  (pta)  Weight change: Filed Weights   09/03/23 0601 09/04/23 0555  Weight: 76.2 kg 78 kg    Intake/Output:   Intake/Output Summary (Last 24 hours) at 09/05/2023 1408 Last data filed at 09/05/2023 1300 Gross per 24 hour  Intake 1312.22 ml  Output 4070 ml  Net -2757.78 ml      Physical Exam    General:  Elderly male. Sitting up in bed. No distress Cor: JVP ~ 8 cm. Irregular rhythm. No murmurs. Lungs:  breathing nonlabored Abdomen: soft, nontender, nondistended. Extremities: no edema Neuro: alert & orientedx3. Affect pleasant   Telemetry  Afib 100s, frequent PVCs, short runs NSVT  Labs   Basic Metabolic Panel: Recent Labs  Lab 09/03/23 1440 09/05/23 0312  NA 137 134*  K  3.9 3.9  CL 103 96*  CO2 20* 26  GLUCOSE 179* 142*  BUN 20 22  CREATININE 1.23 1.65*  CALCIUM  8.7* 8.3*  MG 1.8 2.4  PHOS 4.1  --     Liver Function Tests: No results for input(s): AST, ALT, ALKPHOS, BILITOT, PROT, ALBUMIN  in the last 168 hours. No results for input(s): LIPASE, AMYLASE in the last 168 hours. No results for input(s): AMMONIA in the last 168 hours.  CBC: Recent Labs  Lab 09/03/23 1440  HGB 11.2*  HCT 34.0*    Cardiac Enzymes: No results for input(s): CKTOTAL, CKMB, CKMBINDEX, TROPONINI in the last 168 hours.  BNP: BNP (last 3 results) No results for input(s): BNP in the last 8760 hours.  ProBNP (last 3 results) Recent Labs    11/08/22 1623  PROBNP 1,382*     CBG: No results for input(s): GLUCAP in the last 168 hours.  Coagulation Studies: No results for input(s): LABPROT, INR in the last 72 hours.   Imaging   ECHOCARDIOGRAM COMPLETE Result Date: 09/05/2023    ECHOCARDIOGRAM REPORT   Patient Name:   MYLZ YUAN Date of Exam: 09/05/2023 Medical Rec #:  [id number]        Height:       71.0 in Accession #:    [id number]       Weight:       172.0 lb Date of Birth:  17-Oct-1945        BSA:          1.977 m Patient Age:    78 years         BP:           97/49 mmHg Patient Gender: M                HR:           94 bpm. Exam Location:  Inpatient Procedure: 2D Echo, Cardiac Doppler and Color Doppler (Both Spectral and Color            Flow Doppler were utilized during procedure). Indications:    CHF  History:        Patient has prior history of Echocardiogram examinations, most                 recent 07/04/2023. Cardiomyopathy, CAD, Aortic  Valve Disease,                 Arrythmias:Atrial Fibrillation; Risk Factors:Hypertension.  Sonographer:    Jayson Gaskins Referring Phys: [phone] Stephenia Vogan S Jennett Tarbell IMPRESSIONS  1. Left ventricular ejection fraction, by estimation, is 30 to 35%. Left ventricular ejection fraction by 2D MOD biplane is 33.0 %. The left ventricle has moderately decreased function. The left ventricle demonstrates global hypokinesis. Left ventricular diastolic function could not be evaluated.  2. Right ventricular systolic function is mildly reduced. The right ventricular size is normal.  3. Left atrial size was moderately dilated.  4. Right atrial size was mildly dilated.  5. The mitral valve is degenerative. Mild mitral valve regurgitation.  6. The aortic valve was not well visualized. Aortic valve regurgitation is moderate.  7. Aortic dilatation noted. There is severe dilatation of the ascending aorta, measuring 56 mm.  8. Rhythm strip during this exam demonstrates premature ventricular contractions and atrial fibrillation. Comparison(s): Changes from prior study are noted. 07/04/2023: LVEF 35-40%. Conclusion(s)/Recommendation(s): Critical findings reported to Dr. Rolan and acknowledged at 09/05/2023 at 1:05 pm. FINDINGS  Left Ventricle: Left ventricular ejection fraction, by estimation,  is 30 to 35%. Left ventricular ejection fraction by 2D MOD biplane is 33.0 %. The left ventricle has moderately decreased function. The left ventricle demonstrates global hypokinesis. The left ventricular internal cavity size was normal in size. There is no left ventricular hypertrophy. Left ventricular diastolic function could not be evaluated due to atrial fibrillation. Left ventricular diastolic function could not be evaluated. Right Ventricle: The right ventricular size is normal. No increase in right ventricular wall thickness. Right ventricular systolic function is mildly reduced. Left Atrium: Left atrial size was moderately dilated. Right Atrium: Right  atrial size was mildly dilated. Pericardium: There is no evidence of pericardial effusion. Mitral Valve: The mitral valve is degenerative in appearance. There is mild calcification of the anterior and posterior mitral valve leaflet(s). Mild mitral valve regurgitation. Tricuspid Valve: The tricuspid valve is grossly normal. Tricuspid valve regurgitation is mild. Aortic Valve: The aortic valve was not well visualized. Aortic valve regurgitation is moderate. Aortic valve mean gradient measures 7.0 mmHg. Aortic valve peak gradient measures 10.6 mmHg. Pulmonic Valve: The pulmonic valve was normal in structure. Pulmonic valve regurgitation is not visualized. Aorta: Aortic dilatation noted. There is severe dilatation of the ascending aorta, measuring 56 mm. IAS/Shunts: No atrial level shunt detected by color flow Doppler. EKG: Rhythm strip during this exam demonstrates premature ventricular contractions and atrial fibrillation.  LEFT VENTRICLE PLAX 2D                        Biplane EF (MOD) LVIDd:         5.00 cm         LV Biplane EF:   Left LVIDs:         4.00 cm                          ventricular LV PW:         0.80 cm                          ejection LV IVS:        0.90 cm                          fraction by                                                 2D MOD                                                 biplane is LV Volumes (MOD)                                33.0 %. LV vol d, MOD    189.4 ml A2C:                           Diastology LV vol d, MOD    230.1 ml      LV e' lateral: 11.60 cm/s A4C: LV vol s, MOD    122.7  ml A2C: LV vol s, MOD    163.0 ml A4C: LV SV MOD A2C:   66.7 ml LV SV MOD A4C:   230.1 ml LV SV MOD BP:    69.6 ml RIGHT VENTRICLE RV S prime:     17.30 cm/s LEFT ATRIUM           Index LA Vol (A2C): 68.5 ml 34.64 ml/m LA Vol (A4C): 91.9 ml 46.47 ml/m  AORTIC VALVE AV Vmax:           163.00 cm/s AV Vmean:          128.000 cm/s AV VTI:            0.279 m AV Peak Grad:      10.6 mmHg AV Mean  Grad:      7.0 mmHg LVOT Vmax:         132.00 cm/s LVOT Vmean:        106.000 cm/s LVOT VTI:          0.233 m LVOT/AV VTI ratio: 0.84  SHUNTS Systemic VTI: 0.23 m Vinie Maxcy MD Electronically signed by Vinie Maxcy MD Signature Date/Time: 09/05/2023/1:07:50 PM    Final    US  EKG SITE RITE Result Date: 09/05/2023 If Site Rite image not attached, placement could not be confirmed due to current cardiac rhythm.    Medications:     Current Medications:  amiodarone   200 mg Oral BID   Chlorhexidine  Gluconate Cloth  6 each Topical Daily   cyanocobalamin   5,000 mcg Oral Q1200   methocarbamol   500 mg Oral QID   multivitamin with minerals  1 tablet Oral Daily   pantoprazole  (PROTONIX ) IV  40 mg Intravenous QHS   potassium chloride  SA  20 mEq Oral Daily   rosuvastatin   5 mg Oral Daily   sodium chloride  flush  3 mL Intravenous Q12H   tamsulosin   0.4 mg Oral Daily   torsemide   20 mg Oral BID    Infusions:  sodium chloride      norepinephrine  (LEVOPHED ) Adult infusion        Patient Profile   78 y.o. male with history of permanent atrial fibrillation, HFrEF/NICM, CAD, thoracic aortic aneurysm, remote hx PE. Presented 09/03/23 for L1 spinal surgery. Post-op developed unstable SVT and hypotension requiring pressor support.  Assessment/Plan   Hypotension/concern for shock -Etiology not certain -Initially had hypotension in setting of SVT. However, remained on pressor support even after cardioversion. -On NE at 8 mcg/kg/min. Some trouble maintaining MAP of 65 d/t wide pulse pressure with diastolic in the 40s. Target SBP > 100. -Repeat echo -Agree with PICC line, CO-OX and CVPs ordered by CCM -Check lactic acid -Afebrile. CBC pending.  2. SVT Permanent atrial fibrillation PVCs -SVT post-op associated with hypotension. Failed multiple cardioversion attempts and adenosine 6&12, cardioverted to Afib once started on amiodarone  -Switch PO amiodarone  to IV with need for NE and frequent  PVCs/short NSVT runs -Not currently anticoagulated post-op, start when okay with Neurosurgery. Takes Xarelto  at home  3. HFrEF -EF 50-55% in 2023, down to 40-45% in 2024 -Most recent echo 06/25: EF 35-40%, RV okay, severe LAE, mild to moderate MR, moderate AI, ascending aorta measures 56 mm. -Repeat echo pending -Etiology for CM not certain. No obstructive CAD on coronary CTA. ?  Due longstanding afib with uncontrolled rate +/- PVC mediated -Would likely benefit from MRI before discharge -Home GDMT on hold d/t hypotension requiring pressors -Restart farxiga  when more mobile -Volume okay on exam. Continue po  torsemide  20 BID.  4. CAD -Coronary CTA earlier this year with nonobstructive CAD, calcium  score > 1900 -Continue statin. No aspirin with need for chronic anticoagulation  5. AKI -Scr 1.2>1.65 -? D/t hypotension -Maintain SBP > 100  6. Aortic insufficiency -Moderate, likely d/t annular dilatation from thoracic aortic aneurysm -Likely contributing to wide pulse pressure  7. Severe obstructive and central sleep apnea -Uses BiPAP nightly   Length of Stay: 2  Manuelita Dutch, [place] 09/05/2023, 2:08 PM   Advanced Heart Failure Team Pager [phone] (M-F; 7a - 5p)  Please contact [facility] for night-coverage after hours (4p -7a ) and weekends on [email or web address]   Patient seen with PA, I formulated the plan and agree with the above note.   Patient had L1 burst fracture with cordal compression.  He had L1 decompression and fusion of T10-L4 on 8/27.  Post-op, he had rapid SVT requiring DCCV x 3 and amiodarone  initiation. He came out of the OR on norepinephrine  and has been on norepinephrine  since, he was on NE 12 this morning.    Echo was done today, EF 30-35%, mild RV dysfunction, ascending aorta 56 mm with moderate AI.  Prior echo similar with EF 35-40%, moderate AI.   Patient has permanent AF, he is in atrial fibrillation rate 80s.    General: NAD Neck: No JVD, no thyromegaly or  thyroid  nodule.  Lungs: Clear to auscultation bilaterally with normal respiratory effort. CV: Nondisplaced PMI.  Heart irregular S1/S2, no S3/S4, no murmur.  No peripheral edema.  No carotid bruit.  Normal pedal pulses.  Abdomen: Soft, nontender, no hepatosplenomegaly, no distention.  Skin: Intact without lesions or rashes.  Neurologic: Alert and oriented x 3.  Psych: Normal affect. Extremities: No clubbing or cyanosis.  HEENT: Normal.   1. Shock: Patient has been on NE x 3 days post-op.  He has a wide pulse pressure likely due to his moderate AI.  He lost his peripheral IV and NE was stopped.  BP is very stable off NE, SBP 110s-120s.  He does not appear to need a pressor.  Echo is stable compared to prior, EF 30-35%, mild RV dysfunction, ascending aorta 56 mm with moderate AI.  Prior echo similar with EF 35-40%, moderate AI.  - He can stay off NE.  - Place PICC and check CVP and co-ox.  - Send a lactate.  2. Chronic systolic CHF: Echo showed EF 30-35%, mild RV dysfunction, ascending aorta 56 mm with moderate AI. Nonischemic cardiomyopathy, coronary CTA in 7/25 showed extensive but nonobstructive CAD.  Cardiomyopathy may be due to permanent AF, ?elevated HR at times. He does not look volume overloaded on exam.  - As above, place PICC to follow CVP and co-ox.  - Off NE with stable BP, restart GDMT as BP/renal function improve.  3. Atrial fibrillation: Permanent.  Did not cardiovert to NSR when shocked for SVT.  - Can start Toprol  XL if co-ox looks ok after PICC placed.  - Restart Xarelto  when ok with neurosurgery.  4. SVT: Patient had rapid SVT post-op, required DCCV.  Now on amiodarone .  - Would continue amiodarone  200 mg bid for now.  5. CAD: Nonobstructive but extensive CAD on coronary CTA.  - Continue statin.  - No ASA with anticoagulation.  6. AKI: Creatinine up to 1.6.  Follow trend.   7. Dilated aortic root/ascending aorta with moderate AI: 56 mm ascending aorta on echo, this is  roughly stable from last CTA.   - Close followup with  TCTS as outpatient.  - Moderate AI is likely causing the wide pulse pressure.  8. S/p decompression L1, fusion T10-L4: Per neurosurgery.  Needs PT.  - Graded compression stockings to help with orthostasis.   CRITICAL CARE Performed by: Ezra Shuck  Total critical care time: 70 minutes  Critical care time was exclusive of separately billable procedures and treating other patients.  Critical care was necessary to treat or prevent imminent or life-threatening deterioration.  Critical care was time spent personally by me on the following activities: development of treatment plan with patient and/or surrogate as well as nursing, discussions with consultants, evaluation of patient's response to treatment, examination of patient, obtaining history from patient or surrogate, ordering and performing treatments and interventions, ordering and review of laboratory studies, ordering and review of radiographic studies, pulse oximetry and re-evaluation of patient's condition.   Ezra Shuck 09/05/2023 2:21 PM

## 2023-09-05 NOTE — Progress Notes (Signed)
  Patient Name: Aaron Richmond Date of Encounter: 09/05/2023  Primary Cardiologist: None Electrophysiologist: None  Interval Summary   The patient is doing well today.  NAEO.   Sore but otherwise without complaint.   Vital Signs    Vitals:   09/05/23 0630 09/05/23 0652 09/05/23 0654 09/05/23 0700  BP: (!) 117/49 130/66 (!) 136/57 (!) 92/56  Pulse: 74   78  Resp: 18   17  Temp:      TempSrc:      SpO2: 93%   92%  Weight:      Height:        Intake/Output Summary (Last 24 hours) at 09/05/2023 0811 Last data filed at 09/05/2023 0600 Gross per 24 hour  Intake 1373.99 ml  Output 3970 ml  Net -2596.01 ml   Filed Weights   09/03/23 0601 09/04/23 0555  Weight: 76.2 kg 78 kg    Physical Exam    GEN- NAD, Alert and oriented  Lungs- Clear to ausculation bilaterally, normal work of breathing Cardiac- Irregularly irregular rate and rhythm, no murmurs, rubs or gallops GI- soft, NT, ND, + BS Extremities- no clubbing or cyanosis. No edema  Telemetry    AF 60-70s (personally reviewed)  Hospital Course    AAMARI STRAWDERMAN is a 78 y.o. male with a history of permanent AF (by EKG back to 2014 at least), HFrEF, h/o thoracic aortic aneurysm, and non-obstructive CAD. Presented today for surgery for L1 burst fracture with cord compression status post kyphoplasty. Pt noted to be in a narrow complex tachycardia and hypotension requiring pressor support after the case. Cardiology called to bedside and pt failed to cardiovert x 2 @ 200J. EP called to bedside.  Pt given adenosine 6 -> 12 through a peripheral IV with no affect on his rhythm.  Noted to have 1 PVC on telemetry but unable to save strip. Pt failed additional cardioversion @200J  with pressure held. Pt started on amiodarone  and [place] retrieved from cath lab. Pt converted to AF RVR with one 360J shock.  Pressures rapidly improved.   Assessment & Plan    Permanent atrial fibrillation Secondary hypercoagulable  state Supraventricular tachycardia No recurrent tachy arrhythmia  Continue amiodarone  200 mg BID x 2 weeks then 200 mg daily.  Metoprolol  held with soft pressures, can resume at discharge if BP stable.    Hypotension Remains on Norepi  Wean as tolerated   Chronic HFrEF CAD (non-obstructive) EF 35-40% 07/04/2023. CT coronary 7/10 with mild/mod non-obstructive disease. No chest pain or dyspnea today.  HF GDMT held with soft BP.  Consider holding Entresto  until outpatient follow up with Dr. Monetta.   S/p Lumbar Surgery Per neurosurgery  No further tachy-arrhyhtmia. Potentially plan short course of amiodarone  post op and follow up with Dr. Monetta as scheduled.   For questions or updates, please contact [facility] Please consult [email or web address] for contact info under     Signed, Ozell Prentice Passey, PA-C  09/05/2023, 8:11 AM

## 2023-09-05 NOTE — Consult Note (Signed)
 Physical Medicine and Rehabilitation Consult Reason for Consult: Rehab Referring Physician: Dr. Daren   HPI: Aaron Richmond is a 78 y.o. male who has past medical history of aortic root aneurysm, CHF, CKD, hypertension, lumbar radiculopathy, prediabetes, CKD, heart failure, OSA, A-fib, CAD who was admitted for spinal fusion after he was found to have severe spinal cord compression at L1 burst fracture status post kyphoplasty.  Patient had history of prior L4 S1 fusion as well as kyphoplasty but developed progressive back pain, numbness and weakness in his legs.  On 09/03/2023 patient had T10-L4 fusion by Dr. Onetha on 09/03/2023.  Patient developed narrow complex tachycardia with hypotension, initially failed cardioversion attempts but later converted to A-fib RVR with 360 J shock with improved pressures.  Patient treated with amiodarone  and pressors for hypotension. Prior to admission pt reports limited mobility due to back pain. He was ambulating with the walker mostly for shorter distances. Has chronic R foot drop that started after hip surgery, reports he had recent EMG/NCS. Has a AFO for his RLE.   Patient was seen by PT and OT and found to have functional deficits and felt to be a candidate for inpatient rehabilitation.  Chart review indicates patient lives in a 1 level home with one-step to enter, family available 24/7 after discharge.    Home: Home Living Family/patient expects to be discharged to:: Private residence Living Arrangements: Spouse/significant other Available Help at Discharge: Family, Available 24 hours/day Type of Home: House Home Access: Stairs to enter [employer] of Steps: 1 Entrance Stairs-Rails: Left, Right Home Layout: One level Bathroom Shower/Tub: Health visitor: Standard Bathroom Accessibility: Yes Home Equipment: Agricultural consultant (2 wheels), [employer] - single point, Information systems manager, Lift chair, Rollator (4 wheels)  Functional  History: Prior Function Prior Level of Function : Needs assist Mobility Comments: Ambulating with RW, Fall earlier this year resulting in Rt hip fracture ADLs Comments: Needs assist for LB dressing, wife does IADL Functional Status:  Mobility: Bed Mobility Overal bed mobility: Needs Assistance Bed Mobility: Rolling, Sit to Sidelying Rolling: Min assist, Used rails Sidelying to sit: Min assist, Used rails Sit to sidelying: Mod assist, Used rails General bed mobility comments: in recliner when PT entered room Transfers Overall transfer level: Needs assistance Equipment used: Standard walker Transfers: Sit to/from Stand Sit to Stand: Mod assist Bed to/from chair/wheelchair/BSC transfer type:: Step pivot Step pivot transfers: Mod assist General transfer comment: Mod assist for boost to stand with cues for hand placement and hip hinge to maintain spinal precautions. Heavy retropulsion present, fear of falling forward, gradually improves but limited by dizziness. Needs assist to control descent into chair. Mod assis to scoot to [place] of chair. Ambulation/Gait Pre-gait activities: Static march, limited, mod assist.    ADL: ADL Overall ADL's : Needs assistance/impaired Eating/Feeding: Set up Grooming: Set up, Sitting Grooming Details (indicate cue type and reason): limited by A line in dominant hand, asking RN and staff to assist with hand to mouth bc of this Upper Body Bathing: Moderate assistance Lower Body Bathing: Maximal assistance Upper Body Dressing : Maximal assistance Upper Body Dressing Details (indicate cue type and reason): doffing brace Lower Body Dressing: Maximal assistance Lower Body Dressing Details (indicate cue type and reason): doff socks, unable to perform figure 4 Toilet Transfer: Moderate assistance, Stand-pivot (1 person HHA) Toilet Transfer Details (indicate cue type and reason): face to face simulated through return to bed Toileting- Clothing Manipulation and  Hygiene: Maximal assistance, Bed level  Functional mobility during ADLs: Moderate assistance (1 person HHA/face to face)  Cognition: Cognition Orientation Level: Oriented X4, Oriented to person, Oriented to place, Oriented to time, Oriented to situation Cognition Arousal: Alert Behavior During Therapy: [facility] for tasks assessed/performed   Review of Systems  Constitutional:  Negative for chills and fever.  HENT:  Positive for hearing loss (neeeds new hearing aids).   Eyes:  Negative for blurred vision and double vision.  Respiratory:  Negative for shortness of breath.   Cardiovascular:  Negative for chest pain.  Gastrointestinal:  Negative for abdominal pain, constipation, diarrhea, nausea and vomiting.  Genitourinary:  Positive for urgency.  Musculoskeletal:  Positive for back pain and joint pain.  Skin:  Negative for rash.  Neurological:  Positive for dizziness, tingling, sensory change, focal weakness (Rt foot drop- chronic) and weakness. Negative for headaches.  Psychiatric/Behavioral:  The patient has insomnia.    Past Medical History:  Diagnosis Date   Aneurysm of aortic root    Overview:  Last Assessment & Plan:  Planning to see Dr Army for evaluation of 5cm aneurysm.  - will check PFT in prep for possible procedure / SGY   Aortic atherosclerosis (HCC) 06/14/2022   Aortic regurgitation    Aortic valve insufficiency    Ascending aortic aneurysm (HCC) 09/11/2016   Atherosclerotic vascular disease 05/27/2022   Atrial fibrillation (HCC)    Atrial fibrillation with RVR (HCC) 07/05/2022   BMI 27.0-27.9,adult 06/14/2022   BPH (benign prostatic hyperplasia)    BRBPR (bright red blood per rectum) 05/20/2023   Cardiomyopathy (HCC) 09/11/2016   CHF (congestive heart failure) (HCC)    CKD (chronic kidney disease) stage 2, GFR 60-89 ml/min 11/23/2021   Closed fracture of right hip (HCC) 03/10/2023   Coronary artery calcification seen on CT scan 10/13/2017   Dysrhythmia     Essential hypertension 06/14/2022   Gastrointestinal hemorrhage 05/20/2023   Heart murmur    High risk medication use 07/31/2022   History of cardiomyopathy 11/09/2021   History of pulmonary embolism 09/24/2012   Overview:  Last Assessment & Plan:  Hx PE x 2 per notes, ? 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  - needs auto-titration study by [employer] or local company, data to RB to adjust his home device.    OSA (obstructive sleep apnea) 09/13/2022   Prediabetes    Primary osteoarthritis involving multiple joints 06/14/2022   Prostate cancer metastatic to bone (HCC) 06/14/2022   Pulmonary embolism (HCC)    Rhinitis    Sacral wound 04/11/2023   Seasonal allergies 06/14/2022   Spondylolisthesis of lumbar region 12/12/2021   Stage 3a chronic kidney disease (HCC) 06/14/2022   Unstageable pressure ulcer of sacral region (HCC) 03/10/2023   Past Surgical History:  Procedure Laterality Date   APPENDECTOMY     COLONOSCOPY N/A 05/23/2023   Procedure: COLONOSCOPY;  Surgeon: Elicia Claw, MD;  Location: [facility];  Service: Gastroenterology;  Laterality: N/A;  HERNIA REPAIR     LAMINECTOMY WITH POSTERIOR LATERAL ARTHRODESIS LEVEL 4 N/A 09/03/2023   Procedure: Posterior lateral fusion - Thoracic Ten-Thoracic Eleven - Thoracic Eleven -Thoracic Twelve - Thoracic Twelve-Lumbar One - Lumbar One-Lumbar Two - Lumbar Two-Lumbar  Three - Lumbar Three-Lumbar Four, laminectomy and foraminotomy transpedicular decompression Lumbar One;  Surgeon: Onetha Kuba, MD;  Location: [facility] OR;  Service: Neurosurgery;  Laterality: N/A;  Posterior lateral fusion - T10-T11    LUMBAR LAMINECTOMY  02/06/2022   Facetectom & foraminotomy for decompression of the cauda equina & nerve root L4-5, Arley PHEBE Dark, MD   TONSILLECTOMY     Family History  Problem Relation Age of Onset   Stroke Mother    Alzheimer's disease Mother    Heart attack Mother    Stroke Father    Diabetes Mellitus II Father    Social History:  reports that he has never smoked. He has never used smokeless tobacco. He reports that he does not drink alcohol  and does not use drugs. Allergies:  Allergies  Allergen Reactions   Cipro [Ciprofloxacin Hcl] Other (See Comments)    Increased risk of rupture  of ascending thoracic aortic aneurysm   Medications Prior to Admission  Medication Sig Dispense Refill   acetaminophen  (TYLENOL ) 500 MG tablet Take 1,000 mg by mouth 2 (two) times daily.     methocarbamol  (ROBAXIN ) 500 MG tablet Take 500 mg by mouth every 6 (six) hours as needed for muscle spasms.     metoprolol  tartrate (LOPRESSOR ) 50 MG tablet TAKE 1 & 1/2 (ONE & ONE-HALF) TABLETS BY MOUTH TWICE DAILY 180 tablet 0   polyethylene glycol (MIRALAX  / GLYCOLAX ) 17 g packet Take 17 g by mouth daily as needed for mild constipation.     potassium chloride  SA (KLOR-CON  M20) 20 MEQ tablet Take 20 mEq by mouth daily in the afternoon.     rosuvastatin  (CRESTOR ) 5 MG tablet Take 1 tablet by mouth once daily 90 tablet 2   sacubitril -valsartan  (ENTRESTO ) 24-26 MG Take 1 tablet by mouth 2 (two) times daily. 180 tablet 3   tamsulosin  (FLOMAX ) 0.4 MG CAPS capsule TAKE 1 CAPSULE BY MOUTH TWICE DAILY 30 MINUTES FOLLOWING  THE  SAME  MEAL  EACH  DAY 180 capsule 0   torsemide  (DEMADEX ) 20 MG tablet Take 1 tablet by mouth twice daily 180 tablet 0   traMADol  (ULTRAM ) 50 MG tablet Take 2 tablets  (100 mg total) by mouth every 8 (eight) hours as needed. 120 tablet 1   FARXIGA  5 MG TABS tablet Take 1 tablet by mouth once daily (Patient not taking: Reported on 09/04/2023) 90 tablet 0   Methylcobalamin (METHYL B-12 PO) Take 5,000 mcg by mouth every evening. (Patient not taking: Reported on 09/04/2023)     Multiple Vitamins-Minerals (MENS 50+ MULTIVITAMIN) TABS Take 1 tablet by mouth every evening. (Patient not taking: Reported on 09/04/2023)     rivaroxaban  (XARELTO ) 20 MG TABS tablet Take 1 tablet (20 mg total) by mouth daily with supper. (Patient not taking: Reported on 09/04/2023)       Blood pressure (!) 100/51, pulse 85, temperature (!) 97.5 F (36.4 C), temperature source Oral, resp. rate (!) 23, height 5' 11 (1.803 m), weight 78 kg, SpO2 96%. Physical Exam  General: No apparent distress HEENT: Head is normocephalic, atraumatic, sclera anicteric, oral mucosa pink and moist, wearing glasses Heart: IIR Chest: CTA bilaterally without wheezes, rales, or rhonchi; no distress, Nasal o2 3L Abdomen: Soft, non-tender, non-distended, bowel sounds positive. Extremities:  NO edema Psych: Pt's affect is appropriate. Pt is cooperative, Jovial  Skin: Bruising on b/l UE, Incision with dressing CDI Neuro:    Mental Status: AAOx4, memory grossly intact Speech/Languate: fluent, follows simple commands CRANIAL NERVES:2-12 grossly intact   MOTOR: RUE: 4/5 Deltoid,4+/5 Biceps, 4+/5 Triceps,4+/5 Grip LUE: 4/5 Deltoid, 4+/5 Biceps, 4+/5 Triceps, 4+/5 Grip RLE: HF 3/5, KE 4/5, ADF 0/5, APF 4-/5 LLE: HF 3/5, KE 4/5, ADF 4/5, APF 4/5   REFLEXES: NO ankle clonus, DTR hyporeflexive throughout  SENSORY: Normal to touch all 4 extremities  Coordination: No UE ataxia/dysmetria noted     Results for orders placed or performed during the hospital encounter of 09/03/23 (from the past 24 hours)  Basic metabolic panel with GFR     Status: Abnormal   Collection Time: 09/05/23  3:12 AM  Result Value Ref  Range   Sodium 134 (L) 135 - 145 mmol/L   Potassium 3.9 3.5 - 5.1 mmol/L   Chloride 96 (L) 98 - 111 mmol/L   CO2 26 22 - 32 mmol/L   Glucose, Bld 142 (H) 70 - 99 mg/dL   BUN 22 8 - 23 mg/dL   Creatinine, Ser 8.34 (H) 0.61 - 1.24 mg/dL   Calcium  8.3 (L) 8.9 - 10.3 mg/dL   GFR, Estimated 43 (L) >60 mL/min   Anion gap 12 5 - 15  Magnesium      Status: None   Collection Time: 09/05/23  3:12 AM  Result Value Ref Range   Magnesium  2.4 1.7 - 2.4 mg/dL   US  EKG SITE RITE Result Date: 09/05/2023 If Site Rite image not attached, placement could not be confirmed due to current cardiac rhythm.   Assessment/Plan: Diagnosis: L1 Burst fracture with cord compression s/p T10-L4 fusion with transpedicular decompression L1  Does the need for close, 24 hr/day medical supervision in concert with the patient's rehab needs make it unreasonable for this patient to be served in a less intensive setting? Yes Co-Morbidities requiring supervision/potential complications:  -Hypotension, SVT, HFrEF, CAD, AKI, sleep apnea, Aortic Insufficiency, chronic back pain, Afib, CAD Leukocytosis, ABLA, AKI, R foot drop-chronic Due to bladder management, bowel management, safety, skin/wound care, disease management, medication administration, pain management, and patient education, does the patient require 24 hr/day rehab nursing? Yes Does the patient require coordinated care of a physician, rehab nurse, therapy disciplines of PT/OT to address physical and functional deficits in the context of the above medical diagnosis(es)? Yes Addressing deficits in the following areas: balance, endurance, locomotion, strength, transferring, bowel/bladder control, bathing, dressing, feeding, grooming, toileting, and psychosocial support Can the patient actively participate in an intensive therapy program of at least 3 hrs of therapy per day at least 5 days per week? Yes The potential for patient to make measurable gains while on inpatient  rehab is excellent Anticipated functional outcomes upon discharge from inpatient rehab are supervision and min assist  with PT, supervision and min assist with OT, n/a with SLP. Estimated rehab length of stay to reach the above functional goals is: 10 days Anticipated discharge destination: Home Overall Rehab/Functional Prognosis: good  POST ACUTE RECOMMENDATIONS: This patient's condition is appropriate for continued rehabilitative care in the following setting: CIR Patient has agreed to participate in recommended program. Yes Note that insurance prior authorization may be required for reimbursement for recommended care.  Comment: I think pt is a good candidate for CIR. Rehab admission coordinator fo f/u.    MEDICAL RECOMMENDATIONS: Consider additional medication for constipation, consider checking PVR/Bladder scans  I have personally performed a face to face diagnostic evaluation of this patient. Additionally, I have examined the patient's medical record including any pertinent labs and radiographic images.    Thanks,  Murray Collier, MD 09/05/2023

## 2023-09-05 NOTE — Progress Notes (Signed)
 Inpatient Rehab Admissions Coordinator:    I met with Pt. To discuss potential CIR admit. He is interested and wife can provide 24/7 min A. I will send case to insurance once medically ready.   Leita Kleine, MS, CCC-SLP Rehab Admissions Coordinator  [phone] (celll) [phone] (office)

## 2023-09-05 NOTE — Consult Note (Addendum)
 error

## 2023-09-05 NOTE — TOC Initial Note (Signed)
 Transition of Care [facility]) - Initial/Assessment Note    Patient Details  Name: Aaron Richmond MRN: [id number] Date of Birth: 17-Jul-1945  Transition [facility] CM/SW Contact:    Justina Delcia Czar, RN Phone Number: [phone] 09/05/2023, 7:07 AM  Clinical Narrative:                  Spoke to pt and wife at bedside. Pt had HH in the past [facility]. Has been to SNF rehab at [facility] in past and does not want to return to SNF. Wife states pt was going to outpt PT at [facility], plan is to return once he is stronger.   Will need PT/OT evaluation and recommendations.  Will continue to follow for dc needs.  Expected Discharge Plan: Home w Home Health Services Barriers to Discharge: Continued Medical Work up   Patient Goals and CMS Choice Patient states their goals for this hospitalization and ongoing recovery are:: wants to feel better CMS Medicare.gov Compare Post Acute Care list provided to:: Patient Choice offered to / list presented to : Patient      Expected Discharge Plan and Services   Discharge Planning Services: CM Consult Post Acute Care Choice: Home Health Living arrangements for the past 2 months: Single Family Home                                      Prior Living Arrangements/Services Living arrangements for the past 2 months: Single Family Home Lives with:: Spouse Patient language and need for interpreter reviewed:: Yes Do you feel safe going back to the place where you live?: Yes      Need for Family Participation in Patient Care: Yes (Comment) Care giver support system in place?: Yes (comment) Current home services: DME (rolling walker, cane, Bip[employer]) Criminal Activity/Legal Involvement Pertinent to Current Situation/Hospitalization: No - Comment as needed  Activities of Daily Living   ADL Screening (condition at time of admission) Independently performs ADLs?: Yes (appropriate for developmental age) Is the patient deaf or  have difficulty hearing?: Yes (HOH wears a hearing aid (Currently wife has it)) Does the patient have difficulty seeing, even when wearing glasses/contacts?: No Does the patient have difficulty concentrating, remembering, or making decisions?: No  Permission Sought/Granted Permission sought to share information with : Case Manager, PCP, Family Supports Permission granted to share information with : Yes, Verbal Permission Granted  Share Information with NAME: Waino Mounsey  Permission granted to share info w AGENCY: Home Health, DME, PCP  Permission granted to share info w Relationship: wife  Permission granted to share info w Contact Information: [phone]  Emotional Assessment Appearance:: Appears stated age Attitude/Demeanor/Rapport: Engaged Affect (typically observed): Accepting Orientation: : Oriented to Self, Oriented to Place, Oriented to  Time, Oriented to Situation   Psych Involvement: No (comment)  Admission diagnosis:  Lumbar radiculopathy [M54.16] Lumbar burst fracture (HCC) [S32.001A] Patient Active Problem List   Diagnosis Date Noted   Lumbar burst fracture (HCC) 09/03/2023   Chronic right-sided low back pain with right-sided sciatica 07/14/2023   BRBPR (bright red blood per rectum) 05/20/2023   Gastrointestinal hemorrhage 05/20/2023   Chronic low back pain without sciatica 05/20/2023   Sacral wound 04/11/2023   Closed fracture of right hip (HCC) 03/10/2023   Unstageable pressure ulcer of sacral region (HCC) 03/10/2023   OSA (obstructive sleep apnea) 09/13/2022   Prediabetes 09/13/2022   Atrial  fibrillation with RVR (HCC) 07/05/2022   Prostate cancer metastatic to bone (HCC) 06/14/2022   Aortic atherosclerosis (HCC) 06/14/2022   Essential hypertension 06/14/2022   Hypercholesterolemia 06/14/2022   Mitral valve insufficiency 06/14/2022   Primary osteoarthritis involving multiple joints 06/14/2022   Seasonal allergies 06/14/2022   BMI 27.0-27.9,adult 06/14/2022    Atherosclerotic vascular disease 05/27/2022   Lumbar radiculopathy 01/24/2022   Spondylolisthesis of lumbar region 12/12/2021   CKD (chronic kidney disease) stage 2, GFR 60-89 ml/min 11/23/2021   History of cardiomyopathy 11/09/2021   Aortic regurgitation 06/11/2021   BPH (benign prostatic hyperplasia) 06/11/2021   Pulmonary embolism (HCC) 06/11/2021   Rhinitis 06/11/2021   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   Malignant neoplasm of prostate (HCC) 10/04/2020   Malignant neoplasm of prostate metastatic to bone (HCC) 10/04/2020   Obesity (BMI 30-39.9) 10/16/2017   Hyperlipidemia 10/14/2017   Preoperative clearance 10/13/2017   Coronary artery calcification seen on CT scan 10/13/2017   Cardiomyopathy (HCC) 09/11/2016   Ascending aortic aneurysm (HCC) 09/11/2016   Hypertension 10/06/2014   Atrial fibrillation (HCC) 09/24/2012   Aneurysm of aortic root (HCC)    Aortic valve insufficiency    PCP:  Fleeta Valeria Mayo, MD Pharmacy:   [facility] [address] [place] - [address] [place] [address] Phone: [phone] Fax: [phone]  [facility] - [facility] [address] [place] [address] Phone: [phone] Fax: [phone]     Social Drivers of Health (SDOH) Social History: SDOH Screenings   Food Insecurity: No Food Insecurity (09/03/2023)  Housing: Unknown (09/03/2023)  Transportation Needs: No Transportation Needs (09/03/2023)  Utilities: Not At Risk (09/03/2023)  Depression (PHQ2-9): Low Risk  (03/10/2023)  Social Connections: Socially Integrated (09/03/2023)  Tobacco Use: Low Risk  (09/03/2023)   SDOH Interventions:     Readmission Risk Interventions     No data to display

## 2023-09-05 NOTE — Progress Notes (Signed)
 Inpatient Rehab Admissions Coordinator:   I met with Pt and wife to discuss potential CIR admit. They are interested and wife can do 24/7 min A at d/c. I will follow and send case to insurance when medically ready.   Leita Kleine, MS, CCC-SLP Rehab Admissions Coordinator  [phone] (celll) [phone] (office)

## 2023-09-05 NOTE — Progress Notes (Signed)
   09/05/23 2020  BiPAP/CPAP/SIPAP  Reason BIPAP/CPAP not in use Other(comment) (pt states that he does not want to wear the BIPAP, sounds like he is not complaint at home with his newest machine and mask. He said he has appt with pulm after this adm is over and he will get a better mask and try again.)  BiPAP/CPAP /SiPAP Vitals  Pulse Rate 88  Resp (!) 9  BP (!) 110/47  SpO2 95 %  MEWS Score/Color  MEWS Score 1  MEWS Score Color Green   Pt states that he is only wearing the [place] because we tell him that he has to and he will take it off when we leave the room, RN aware.

## 2023-09-06 DIAGNOSIS — I5022 Chronic systolic (congestive) heart failure: Secondary | ICD-10-CM | POA: Diagnosis not present

## 2023-09-06 DIAGNOSIS — I5020 Unspecified systolic (congestive) heart failure: Secondary | ICD-10-CM

## 2023-09-06 DIAGNOSIS — N179 Acute kidney failure, unspecified: Secondary | ICD-10-CM

## 2023-09-06 DIAGNOSIS — I4891 Unspecified atrial fibrillation: Secondary | ICD-10-CM

## 2023-09-06 DIAGNOSIS — K5900 Constipation, unspecified: Secondary | ICD-10-CM

## 2023-09-06 DIAGNOSIS — I4821 Permanent atrial fibrillation: Secondary | ICD-10-CM | POA: Diagnosis not present

## 2023-09-06 DIAGNOSIS — R579 Shock, unspecified: Secondary | ICD-10-CM | POA: Diagnosis not present

## 2023-09-06 DIAGNOSIS — I2510 Atherosclerotic heart disease of native coronary artery without angina pectoris: Secondary | ICD-10-CM | POA: Diagnosis not present

## 2023-09-06 DIAGNOSIS — S32012A Unstable burst fracture of first lumbar vertebra, initial encounter for closed fracture: Secondary | ICD-10-CM | POA: Diagnosis not present

## 2023-09-06 LAB — BASIC METABOLIC PANEL WITH GFR
Anion gap: 12 (ref 5–15)
BUN: 22 mg/dL (ref 8–23)
CO2: 28 mmol/L (ref 22–32)
Calcium: 8.6 mg/dL — ABNORMAL LOW (ref 8.9–10.3)
Chloride: 95 mmol/L — ABNORMAL LOW (ref 98–111)
Creatinine, Ser: 1.31 mg/dL — ABNORMAL HIGH (ref 0.61–1.24)
GFR, Estimated: 56 mL/min — ABNORMAL LOW (ref >60)
Glucose, Bld: 108 mg/dL — ABNORMAL HIGH (ref 70–99)
Potassium: 3.4 mmol/L — ABNORMAL LOW (ref 3.5–5.1)
Sodium: 135 mmol/L (ref 135–145)

## 2023-09-06 LAB — CBC
HCT: 27.7 % — ABNORMAL LOW (ref 39.0–52.0)
Hemoglobin: 9.2 g/dL — ABNORMAL LOW (ref 13.0–17.0)
MCH: 31.1 pg (ref 26.0–34.0)
MCHC: 33.2 g/dL (ref 30.0–36.0)
MCV: 93.6 fL (ref 80.0–100.0)
Platelets: 161 K/uL (ref 150–400)
RBC: 2.96 MIL/uL — ABNORMAL LOW (ref 4.22–5.81)
RDW: 15.4 % (ref 11.5–15.5)
WBC: 10.2 K/uL (ref 4.0–10.5)
nRBC: 0 % (ref 0.0–0.2)

## 2023-09-06 LAB — COOXEMETRY PANEL
Carboxyhemoglobin: 1.5 % (ref 0.5–1.5)
Methemoglobin: 0.7 % (ref 0.0–1.5)
O2 Saturation: 75.3 %
Total hemoglobin: 9.7 g/dL — ABNORMAL LOW (ref 12.0–16.0)

## 2023-09-06 LAB — MAGNESIUM: Magnesium: 2.2 mg/dL (ref 1.7–2.4)

## 2023-09-06 LAB — HEPARIN LEVEL (UNFRACTIONATED): Heparin Unfractionated: <0.1 [IU]/mL — ABNORMAL LOW (ref 0.30–0.70)

## 2023-09-06 LAB — APTT: aPTT: 62 s — ABNORMAL HIGH (ref 24–36)

## 2023-09-06 MED ORDER — LACTATED RINGERS IV BOLUS
500.0000 mL | Freq: Once | INTRAVENOUS | Status: AC
  Administered 2023-09-06: 500 mL via INTRAVENOUS

## 2023-09-06 MED ORDER — POTASSIUM CHLORIDE CRYS ER 20 MEQ PO TBCR
40.0000 meq | EXTENDED_RELEASE_TABLET | Freq: Once | ORAL | Status: AC
  Administered 2023-09-06: 40 meq via ORAL
  Filled 2023-09-06: qty 2

## 2023-09-06 MED ORDER — LACTATED RINGERS IV SOLN: INTRAVENOUS | Status: DC

## 2023-09-06 MED ORDER — SORBITOL 70 % SOLN
60.0000 mL | Freq: Once | Status: AC
  Administered 2023-09-06: 60 mL via ORAL
  Filled 2023-09-06: qty 60

## 2023-09-06 MED ORDER — BISACODYL 10 MG RE SUPP
10.0000 mg | Freq: Every day | RECTAL | Status: DC
  Administered 2023-09-06 – 2023-09-07 (×2): 10 mg via RECTAL
  Filled 2023-09-06 (×2): qty 1

## 2023-09-06 MED ORDER — HEPARIN (PORCINE) 25000 UT/250ML-% IV SOLN
1450.0000 [IU]/h | INTRAVENOUS | Status: DC
  Administered 2023-09-06: 900 [IU]/h via INTRAVENOUS
  Administered 2023-09-07: 1100 [IU]/h via INTRAVENOUS
  Administered 2023-09-08: 1300 [IU]/h via INTRAVENOUS
  Administered 2023-09-09 (×2): 1450 [IU]/h via INTRAVENOUS
  Filled 2023-09-06 (×5): qty 250

## 2023-09-06 MED ORDER — METOPROLOL SUCCINATE ER 25 MG PO TB24
12.5000 mg | ORAL_TABLET | Freq: Two times a day (BID) | ORAL | Status: DC
  Administered 2023-09-06 – 2023-09-10 (×8): 12.5 mg via ORAL
  Filled 2023-09-06 (×8): qty 1

## 2023-09-06 MED ORDER — ROSUVASTATIN CALCIUM 5 MG PO TABS
10.0000 mg | ORAL_TABLET | Freq: Every day | ORAL | Status: DC
  Administered 2023-09-06 – 2023-09-10 (×5): 10 mg via ORAL
  Filled 2023-09-06 (×5): qty 2

## 2023-09-06 MED ORDER — SPIRONOLACTONE 12.5 MG HALF TABLET
12.5000 mg | ORAL_TABLET | Freq: Every day | ORAL | Status: DC
  Administered 2023-09-06 – 2023-09-08 (×3): 12.5 mg via ORAL
  Filled 2023-09-06 (×3): qty 1

## 2023-09-06 NOTE — Progress Notes (Signed)
 Physical Therapy Treatment Patient Details Name: Aaron Richmond MRN: [id number] DOB: [birth year] Today's Date: 09/06/2023   History of Present Illness Pt is a 78 y/o male admitted 09/03/23 for planned transpedicular decompression of L1 instrumented fusion from T10-L4. During Sx Pt noted to be in a narrow complex tachycardia and hypotension requiring pressor support and cardioversion x2 and one shock. PMH includes Aneurysm of aortic root, Aortic atherosclerosis, Aortic regurgitation, Aortic valve insufficiency, Ascending aortic aneurysm, Atherosclerotic vascular disease, Atrial fibrillation, CHF, Closed fracture of right hip, Dysrhythmia, HTN, Gastrointestinal hemorrhage, Heart murmur, History of cardiomyopathy, PE, Lumbar radiculopathy, OSA, Prostate cancer metastatic to bone, Rhinitis    PT Comments  Pt is progressing towards goals. Elevated HR this session with difficulty reading a-line due to poor reading. RN in room. Pt Mod A for sit to stand; able to stand at chair for ~ 3 min. Pt was able to ambulate very short non-functional distance. Spouse present and very supportive. Due to pt current functional status, home set up and available assistance at home recommending skilled physical therapy services > 3 hours/day in order to address strength, balance and functional mobility to decrease risk for falls, injury, immobility, skin break down and re-hospitalization.      If plan is discharge home, recommend the following: A lot of help with walking and/or transfers;A lot of help with bathing/dressing/bathroom;Assistance with cooking/housework;Assist for transportation;Help with stairs or ramp for entrance     Equipment Recommendations  None recommended by PT       Precautions / Restrictions Precautions Precautions: Back Precaution Booklet Issued: No Recall of Precautions/Restrictions: Intact Precaution/Restrictions Comments: able to state 3/3 precautions Required Braces or Orthoses: Spinal  Brace;Other Brace Spinal Brace: Lumbar corset Other Brace: AFO RLE Restrictions Weight Bearing Restrictions Per Provider Order: No     Mobility  Bed Mobility     General bed mobility comments: in recliner when PT entered room    Transfers Overall transfer level: Needs assistance Equipment used: Standard walker Transfers: Sit to/from Stand Sit to Stand: Mod assist           General transfer comment: Mod assist for boost to stand with cues for hand placement and hip hinge to maintain spinal precautions. Improved balance in standing without retropulsion today. Pt able to stand for ~ 3 min    Ambulation/Gait Ambulation/Gait assistance: Min assist, +2 physical assistance, +2 safety/equipment Gait Distance (Feet): 3 Feet Assistive device: Standard walker Gait Pattern/deviations: Step-through pattern Gait velocity: decreased Gait velocity interpretation: <1.31 ft/sec, indicative of household ambulator   General Gait Details: short partial step through gait pattern with mid foot initial contact. HR intermittently up to 147 bpm therefore limited at this time.       Balance Overall balance assessment: Needs assistance Sitting-balance support: Feet supported, Single extremity supported Sitting balance-Leahy Scale: Fair Sitting balance - Comments: requires UE support in chair   Standing balance support: Bilateral upper extremity supported, During functional activity, Reliant on assistive device for balance Standing balance-Leahy Scale: Poor      Communication Communication Communication: Impaired Factors Affecting Communication: Hearing impaired  Cognition Arousal: Alert Behavior During Therapy: WFL for tasks assessed/performed   PT - Cognitive impairments: No apparent impairments       Following commands: Intact      Cueing Cueing Techniques: Verbal cues     General Comments General comments (skin integrity, edema, etc.): Pt reports dizziness; difficulty  getting reading from 1 line; with good box wave pt around 111/72 but intermittently down to  80's/40's with poor line      Pertinent Vitals/Pain Pain Assessment Pain Assessment: Faces Faces Pain Scale: Hurts little more Pain Location: back Pain Descriptors / Indicators: Aching, Operative site guarding Pain Intervention(s): Monitored during session, Limited activity within patient's tolerance           PT Goals (current goals can now be found in the care plan section) Acute Rehab PT Goals Patient Stated Goal: Get well, return home PT Goal Formulation: With patient Time For Goal Achievement: 09/18/23 Potential to Achieve Goals: Good Progress towards PT goals: Progressing toward goals    Frequency    Min 3X/week      PT Plan  Continue with current POC        AM-PAC PT 6 Clicks Mobility   Outcome Measure  Help needed turning from your back to your side while in a flat bed without using bedrails?: A Little Help needed moving from lying on your back to sitting on the side of a flat bed without using bedrails?: A Little Help needed moving to and from a bed to a chair (including a wheelchair)?: A Lot Help needed standing up from a chair using your arms (e.g., wheelchair or bedside chair)?: A Lot Help needed to walk in hospital room?: A Lot Help needed climbing 3-5 steps with a railing? : Total 6 Click Score: 13    End of Session Equipment Utilized During Treatment: Gait belt;Back brace Activity Tolerance: Patient tolerated treatment well Patient left: in chair;with call bell/phone within reach;with family/visitor present Nurse Communication: Mobility status;Precautions PT Visit Diagnosis: Unsteadiness on feet (R26.81);Other abnormalities of gait and mobility (R26.89);Muscle weakness (generalized) (M62.81);History of falling (Z91.81);Difficulty in walking, not elsewhere classified (R26.2);Other symptoms and signs involving the nervous system (R29.898);Pain Pain - part of  body:  (back)     Time: 1010-1039 PT Time Calculation (min) (ACUTE ONLY): 29 min  Charges:    $Therapeutic Activity: 23-37 mins PT General Charges $$ ACUTE PT VISIT: 1 Visit                    Dorothyann Maier, DPT, CLT  Acute Rehabilitation Services Office: [phone] (Secure chat preferred)    Dorothyann VEAR Maier 09/06/2023, 10:47 AM

## 2023-09-06 NOTE — Progress Notes (Signed)
 Patient complains of incisional pain.  No radiating pain or numbness into his lower extremities.  Hemodynamically stable.  Patient off pressors.  Still with some tachycardia.  Urine output good.  Wound clean and dry.  Motor and sensory function stable.  Home abdomen soft.  Patient is slowly recovering from thoracolumbar decompression fusion surgery.  Continue efforts at mobilization.  Greatly appreciate cardiology's evaluation and management of his heart failure.

## 2023-09-06 NOTE — Progress Notes (Signed)
 Patient ID: Aaron Richmond, male   DOB: 08/27/1945, 78 y.o.   MRN: [id number]     Advanced Heart Failure Rounding Note  Cardiologist: None  Chief Complaint: CHF Subjective:    Stable BP with wide pulse pressure.  Up to chair this morning.  Has stayed off NE.  Co-ox 75% with CVP 3, creatinine 1.65 => 1.31.  HR 100s, atrial fibrillation.   No dyspnea.    Objective:   Weight Range: 75.2 kg Body mass index is 23.12 kg/m.   Vital Signs:   Temp:  [97.6 F (36.4 C)-98.4 F (36.9 C)] 98.4 F (36.9 C) (08/30 0820) Pulse Rate:  [76-113] 99 (08/30 0800) Resp:  [9-32] 20 (08/30 0800) BP: (92-114)/(43-66) 110/47 (08/29 2020) SpO2:  [87 %-97 %] 94 % (08/30 0800) Arterial Line BP: (106-165)/(34-104) 113/37 (08/30 0800) Weight:  [75.2 kg] 75.2 kg (08/30 0652) Last BM Date :  (pta)  Weight change: Filed Weights   09/03/23 0601 09/04/23 0555 09/06/23 0652  Weight: 76.2 kg 78 kg 75.2 kg    Intake/Output:   Intake/Output Summary (Last 24 hours) at 09/06/2023 0905 Last data filed at 09/06/2023 0800 Gross per 24 hour  Intake 786.64 ml  Output 2170 ml  Net -1383.36 ml      Physical Exam    General:  Well appearing. No resp difficulty HEENT: Normal Neck: Supple. JVP not elevated. Carotids 2+ bilat; no bruits. No lymphadenopathy or thyromegaly appreciated. Cor: PMI nondisplaced. Irregular rate & rhythm. 1/6 SEM RUSB.  Lungs: Clear Abdomen: Soft, nontender, nondistended. No hepatosplenomegaly. No bruits or masses. Good bowel sounds. Extremities: No cyanosis, clubbing, rash, edema Neuro: Alert & orientedx3, cranial nerves grossly intact. moves all 4 extremities w/o difficulty. Affect pleasant   Telemetry   Atrial fibrillation rate 100s (personally reviewed)  Labs    CBC Recent Labs    09/03/23 1440 09/05/23 1200  WBC  --  13.3*  HGB 11.2* 10.3*  HCT 34.0* 30.8*  MCV  --  93.1  PLT  --  172   Basic Metabolic Panel Recent Labs    91/72/74 1440 09/05/23 0312  09/06/23 0500 09/06/23 0512  NA 137 134* 135  --   K 3.9 3.9 3.4*  --   CL 103 96* 95*  --   CO2 20* 26 28  --   GLUCOSE 179* 142* 108*  --   BUN 20 22 22   --   CREATININE 1.23 1.65* 1.31*  --   CALCIUM  8.7* 8.3* 8.6*  --   MG 1.8 2.4  --  2.2  PHOS 4.1  --   --   --    Liver Function Tests No results for input(s): AST, ALT, ALKPHOS, BILITOT, PROT, ALBUMIN  in the last 72 hours. No results for input(s): LIPASE, AMYLASE in the last 72 hours. Cardiac Enzymes No results for input(s): CKTOTAL, CKMB, CKMBINDEX, TROPONINI in the last 72 hours.  BNP: BNP (last 3 results) No results for input(s): BNP in the last 8760 hours.  ProBNP (last 3 results) Recent Labs    11/08/22 1623  PROBNP 1,382*     D-Dimer No results for input(s): DDIMER in the last 72 hours. Hemoglobin A1C No results for input(s): HGBA1C in the last 72 hours. Fasting Lipid Panel No results for input(s): CHOL, HDL, LDLCALC, TRIG, CHOLHDL, LDLDIRECT in the last 72 hours. Thyroid  Function Tests No results for input(s): TSH, T4TOTAL, T3FREE, THYROIDAB in the last 72 hours.  Invalid input(s): FREET3  Other results:   Imaging  ECHOCARDIOGRAM COMPLETE Result Date: 09/05/2023    ECHOCARDIOGRAM REPORT   Patient Name:   Aaron Richmond Date of Exam: 09/05/2023 Medical Rec #:  [id number]        Height:       71.0 in Accession #:    [id number]       Weight:       172.0 lb Date of Birth:  [birth year]        BSA:          1.977 m Patient Age:    78 years         BP:           97/49 mmHg Patient Gender: M                HR:           94 bpm. Exam Location:  Inpatient Procedure: 2D Echo, Cardiac Doppler and Color Doppler (Both Spectral and Color            Flow Doppler were utilized during procedure). Indications:    CHF  History:        Patient has prior history of Echocardiogram examinations, most                 recent 07/04/2023. Cardiomyopathy, CAD, Aortic Valve Disease,                  Arrythmias:Atrial Fibrillation; Risk Factors:Hypertension.  Sonographer:    Jayson Gaskins Referring Phys: [phone] Kasha Howeth S [employer] IMPRESSIONS  1. Left ventricular ejection fraction, by estimation, is 30 to 35%. Left ventricular ejection fraction by 2D MOD biplane is 33.0 %. The left ventricle has moderately decreased function. The left ventricle demonstrates global hypokinesis. Left ventricular diastolic function could not be evaluated.  2. Right ventricular systolic function is mildly reduced. The right ventricular size is normal.  3. Left atrial size was moderately dilated.  4. Right atrial size was mildly dilated.  5. The mitral valve is degenerative. Mild mitral valve regurgitation.  6. The aortic valve was not well visualized. Aortic valve regurgitation is moderate.  7. Aortic dilatation noted. There is severe dilatation of the ascending aorta, measuring 56 mm.  8. Rhythm strip during this exam demonstrates premature ventricular contractions and atrial fibrillation. Comparison(s): Changes from prior study are noted. 07/04/2023: LVEF 35-40%. Conclusion(s)/Recommendation(s): Critical findings reported to Dr. Rolan and acknowledged at 09/05/2023 at 1:05 pm. FINDINGS  Left Ventricle: Left ventricular ejection fraction, by estimation, is 30 to 35%. Left ventricular ejection fraction by 2D MOD biplane is 33.0 %. The left ventricle has moderately decreased function. The left ventricle demonstrates global hypokinesis. The left ventricular internal cavity size was normal in size. There is no left ventricular hypertrophy. Left ventricular diastolic function could not be evaluated due to atrial fibrillation. Left ventricular diastolic function could not be evaluated. Right Ventricle: The right ventricular size is normal. No increase in right ventricular wall thickness. Right ventricular systolic function is mildly reduced. Left Atrium: Left atrial size was moderately dilated. Right Atrium: Right atrial size was  mildly dilated. Pericardium: There is no evidence of pericardial effusion. Mitral Valve: The mitral valve is degenerative in appearance. There is mild calcification of the anterior and posterior mitral valve leaflet(s). Mild mitral valve regurgitation. Tricuspid Valve: The tricuspid valve is grossly normal. Tricuspid valve regurgitation is mild. Aortic Valve: The aortic valve was not well visualized. Aortic valve regurgitation is moderate. Aortic valve mean gradient measures 7.0 mmHg. Aortic valve peak gradient measures  10.6 mmHg. Pulmonic Valve: The pulmonic valve was normal in structure. Pulmonic valve regurgitation is not visualized. Aorta: Aortic dilatation noted. There is severe dilatation of the ascending aorta, measuring 56 mm. IAS/Shunts: No atrial level shunt detected by color flow Doppler. EKG: Rhythm strip during this exam demonstrates premature ventricular contractions and atrial fibrillation.  LEFT VENTRICLE PLAX 2D                        Biplane EF (MOD) LVIDd:         5.00 cm         LV Biplane EF:   Left LVIDs:         4.00 cm                          ventricular LV PW:         0.80 cm                          ejection LV IVS:        0.90 cm                          fraction by                                                 2D MOD                                                 biplane is LV Volumes (MOD)                                33.0 %. LV vol d, MOD    189.4 ml A2C:                           Diastology LV vol d, MOD    230.1 ml      LV e' lateral: 11.60 cm/s A4C: LV vol s, MOD    122.7 ml A2C: LV vol s, MOD    163.0 ml A4C: LV SV MOD A2C:   66.7 ml LV SV MOD A4C:   230.1 ml LV SV MOD BP:    69.6 ml RIGHT VENTRICLE RV S prime:     17.30 cm/s LEFT ATRIUM           Index LA Vol (A2C): 68.5 ml 34.64 ml/m LA Vol (A4C): 91.9 ml 46.47 ml/m  AORTIC VALVE AV Vmax:           163.00 cm/s AV Vmean:          128.000 cm/s AV VTI:            0.279 m AV Peak Grad:      10.6 mmHg AV Mean Grad:      7.0  mmHg LVOT Vmax:         132.00 cm/s LVOT Vmean:        106.000 cm/s LVOT VTI:  0.233 m LVOT/AV VTI ratio: 0.84  SHUNTS Systemic VTI: 0.23 m Vinie Maxcy MD Electronically signed by Vinie Maxcy MD Signature Date/Time: 09/05/2023/1:07:50 PM    Final    US  EKG SITE RITE Result Date: 09/05/2023 If Site Rite image not attached, placement could not be confirmed due to current cardiac rhythm.    Medications:     Scheduled Medications:  amiodarone   200 mg Oral BID   Chlorhexidine  Gluconate Cloth  6 each Topical Daily   cyanocobalamin   5,000 mcg Oral Q1200   methocarbamol   500 mg Oral QID   multivitamin with minerals  1 tablet Oral Daily   pantoprazole  (PROTONIX ) IV  40 mg Intravenous QHS   potassium chloride  SA  20 mEq Oral Daily   rosuvastatin   5 mg Oral Daily   sodium chloride  flush  10-40 mL Intracatheter Q12H   sodium chloride  flush  3 mL Intravenous Q12H   tamsulosin   0.4 mg Oral Daily   torsemide   20 mg Oral BID    Infusions:  sodium chloride      albumin  human 60 mL/hr at 09/06/23 0800   norepinephrine  (LEVOPHED ) Adult infusion Stopped (09/05/23 1300)    PRN Medications: acetaminophen  **OR** acetaminophen , alum & mag hydroxide-simeth, cyclobenzaprine , HYDROcodone -acetaminophen , HYDROmorphone  (DILAUDID ) injection, menthol -cetylpyridinium **OR** phenol, ondansetron  **OR** ondansetron  (ZOFRAN ) IV, mouth rinse, polyethylene glycol, sodium chloride  flush, sodium chloride  flush, traMADol    Assessment/Plan   1. Shock: Patient was on NE x 3 days post-op.  He has a wide pulse pressure likely due to his moderate AI.  BP is very stable off NE, SBP 110s-120s.  Echo is stable compared to prior, EF 30-35%, mild RV dysfunction, ascending aorta 56 mm with moderate AI.  Prior echo similar with EF 35-40%, moderate AI. Excellent co-ox 75%. Lactate was normal 1.3.  - He can stay off NE, would keep systolic BP > 100 rather than using MAP.  - Place PICC and check CVP and co-ox.  2. Chronic  systolic CHF: Echo showed EF 30-35%, mild RV dysfunction, ascending aorta 56 mm with moderate AI. Nonischemic cardiomyopathy, coronary CTA in 7/25 showed extensive but nonobstructive CAD.  Cardiomyopathy may be due to permanent AF, ?elevated HR at times. He does not look volume overloaded on exam. CVP 3 today with co-ox 75%. Creatinine better at 1.3.  - Would like to keep HR better-controlled given concern for tachy-mediated CMP.  Will start Toprol  XL 25 mg bid.  - Start spironolactone  12.5 daily.  3. Atrial fibrillation: Permanent.  Did not cardiovert to NSR when shocked for SVT.  - Can start Toprol  XL today as above.  - Restart Xarelto  when ok with neurosurgery => we will contact them for guidance.  4. SVT: Patient had rapid SVT post-op, required DCCV.  Now on amiodarone .  - Would continue amiodarone  200 mg bid for now.  5. CAD: Nonobstructive but extensive CAD on coronary CTA.  - Continue statin.  - No ASA with anticoagulation.  6. AKI: Creatinine up to 1.6.  Follow trend.   7. Dilated aortic root/ascending aorta with moderate AI: 56 mm ascending aorta on echo, this is roughly stable from last CTA.   - Close followup with TCTS as outpatient.  - Moderate AI is likely causing the wide pulse pressure.  8. S/p decompression L1, fusion T10-L4: Per neurosurgery.  Needs PT.  - Graded compression stockings to help with orthostasis.   OK for progressive unit transfer from my standpoint.   Length of Stay: 3  Ezra Shuck, MD  09/06/2023,  9:05 AM  Advanced Heart Failure Team Pager [phone] (M-F; 7a - 5p)  Please contact [facility] for night-coverage after hours (5p -7a ) and weekends on [email or web address]

## 2023-09-06 NOTE — Progress Notes (Signed)
 [facility] ADULT ICU REPLACEMENT PROTOCOL   The patient does apply for the [facility] Adult ICU Electrolyte Replacment Protocol based on the criteria listed below:   1.Exclusion criteria: TCTS, ECMO, Dialysis, and Myasthenia Gravis patients 2. Is GFR >/= 30 ml/min? Yes.    Patient's GFR today is 56 3. Is SCr </= 2? Yes.   Patient's SCr is 1.31 mg/dL 4. Did SCr increase >/= 0.5 in 24 hours? No. 5.Pt's weight >40kg  Yes.   6. Abnormal electrolyte(s): Potassium  7. Electrolytes replaced per protocol 8.  Call MD STAT for K+ </= 2.5, Phos </= 1, or Mag </= 1 Physician:  Dr. epimenio Medico A Tobyn Osgood 09/06/2023 6:06 AM

## 2023-09-06 NOTE — Consult Note (Signed)
 NAME:  Aaron Richmond, MRN:  [id number], DOB:  09/07/1945, LOS: 3 ADMISSION DATE:  09/03/2023, CONSULTATION DATE:  09/05/23 REFERRING MD:  Onetha, CHIEF COMPLAINT:  shock    History of Present Illness:  78 yo M PMH Afib, HFrEF, thoracic aortic aneurysm, CAD admitted to NSGY service 8/27 for transpedicular decompression L1, instrumented fusion T10-L4 for severe L1 cord compression / burst fracture. Preceding sx RLE numbness/tingling+ back pain.   Sounds like case was pretty unremarkable. Post op significant for unstable SVT. Cards consulted. Failed cardioversion x2 at 200J, failed adenosine 6 & 12. Failed add'l 200J CV attempt, and converted to Afib RVR with 360J CV, with improvement in blood pressures   Pertinent  Medical History   Afib HFrEF  CAD Kyphosis   Significant Hospital Events: Including procedures, antibiotic start and stop dates in addition to other pertinent events   8/27 planned L1 decompression T10-L4 instrumentation fusion. Post op unstable SVT cards consulted  8/28 remained on 10 NE 8/29 remained on NE. Ep signed off. Pharmacy/nursing notified PCCM and requested consultation   Interim History / Subjective:  This is day 3 requiring pressors. On 12 NE overnight, now down to 4 There is no CBC this admission BMP this morning with AKI Cr 1.65   Objective    Blood pressure (!) 110/47, pulse 99, temperature 98.4 F (36.9 C), temperature source Oral, resp. rate 20, height 5' 11 (1.803 m), weight 75.2 kg, SpO2 94%. CVP:  [2 mmHg-6 mmHg] 3 mmHg      Intake/Output Summary (Last 24 hours) at 09/06/2023 1022 Last data filed at 09/06/2023 0800 Gross per 24 hour  Intake 750.38 ml  Output 2090 ml  Net -1339.62 ml   Filed Weights   09/03/23 0601 09/04/23 0555 09/06/23 0652  Weight: 76.2 kg 78 kg 75.2 kg    Examination: Sitting in chair Minimal edema Heart irregular tachy Drain minimal output RASS 0  Patient Lines/Drains/Airways Status     Active  Line/Drains/Airways     Name Placement date Placement time Site Days   Arterial Line 09/03/23 Left Radial 09/03/23  0800  Radial  3   Peripheral IV 07/16/23 18 G Left Antecubital 07/16/23  1207  Antecubital  52   Peripheral IV 09/05/23 22 G 1.75 Left;Posterior Forearm 09/05/23  1258  Forearm  1   PICC Triple Lumen 09/05/23 Right Brachial 38 cm 0 cm 09/05/23  1504  -- 1   Closed System Drain 1 Back Accordion (Hemovac) 10 Fr. 09/03/23  1242  Back  3   External Urinary Catheter 09/04/23  1200  --  2   Wound 09/03/23 1259 Surgical Closed Surgical Incision Back 09/03/23  1259  Back  3             Resolved problem list   Assessment and Plan   L1 burst fx and cord compression S/p thoracolumbar fusion- T10-L4 fusion  - post op incision and drain care per NSGY  - mobility as tolerated by BP and strength  Shock state Afib RVR Underlying HFrEF AKI wide pulse pressure, low CVP and improved HD + Cr with fluids indicates ongoing dehydrated state; doubt much neurogenic at this point - give additional fluid, hold torsemide  - heparin  gtt okay'd by NSGY, eventual return to NoAC, watch neuro symptoms and drain output - Check orthostatics - Eventual GDMT once BP settles out - PO amio  Constipation - Strengthen bowel regimen, may do relistor tomorrow if not working  Keep in ICU one more day  for CVP and A line given complex underlying cardiopulmonary status  Rolan Sharps MD PCCM

## 2023-09-06 NOTE — Progress Notes (Signed)
 PHARMACY - ANTICOAGULATION CONSULT NOTE  Pharmacy Consult for heparin  infusion Indication: atrial fibrillation  Allergies  Allergen Reactions   Cipro [Ciprofloxacin Hcl] Other (See Comments)    Increased risk of rupture  of ascending thoracic aortic aneurysm    Patient Measurements: Height: 5' 11 (180.3 cm) Weight: 75.2 kg (165 lb 12.6 oz) IBW/kg (Calculated) : 75.3 HEPARIN  DW (KG): 76.2  Vital Signs: Temp: 98.4 F (36.9 C) (08/30 0820) Temp Source: Oral (08/30 0820) Pulse Rate: 99 (08/30 0800)  Labs: Recent Labs    09/03/23 1440 09/05/23 0312 09/05/23 1200 09/06/23 0500  HGB 11.2*  --  10.3*  --   HCT 34.0*  --  30.8*  --   PLT  --   --  172  --   CREATININE 1.23 1.65*  --  1.31*  TROPONINIHS 15  --   --   --     Estimated Creatinine Clearance: 50.2 mL/min (A) (by C-G formula based on SCr of 1.31 mg/dL (H)).   Medical History: Past Medical History:  Diagnosis Date   Aneurysm of aortic root    Overview:  Last Assessment & Plan:  Planning to see Dr Army for evaluation of 5cm aneurysm.  - will check PFT in prep for possible procedure / SGY   Aortic atherosclerosis (HCC) 06/14/2022   Aortic regurgitation    Aortic valve insufficiency    Ascending aortic aneurysm (HCC) 09/11/2016   Atherosclerotic vascular disease 05/27/2022   Atrial fibrillation (HCC)    Atrial fibrillation with RVR (HCC) 07/05/2022   BMI 27.0-27.9,adult 06/14/2022   BPH (benign prostatic hyperplasia)    BRBPR (bright red blood per rectum) 05/20/2023   Cardiomyopathy (HCC) 09/11/2016   CHF (congestive heart failure) (HCC)    CKD (chronic kidney disease) stage 2, GFR 60-89 ml/min 11/23/2021   Closed fracture of right hip (HCC) 03/10/2023   Coronary artery calcification seen on CT scan 10/13/2017   Dysrhythmia    Essential hypertension 06/14/2022   Gastrointestinal hemorrhage 05/20/2023   Heart murmur    High risk medication use 07/31/2022   History of cardiomyopathy 11/09/2021    History of pulmonary embolism 09/24/2012   Overview:  Last Assessment & Plan:  Hx PE x 2 per notes, ? 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  - needs auto-titration study by [employer] or local company, data to [facility] to adjust his home device.    OSA (obstructive sleep apnea) 09/13/2022   Prediabetes    Primary osteoarthritis involving multiple joints 06/14/2022   Prostate cancer metastatic to bone (HCC) 06/14/2022   Pulmonary embolism (HCC)    Rhinitis    Sacral wound 04/11/2023   Seasonal allergies 06/14/2022   Spondylolisthesis of lumbar region 12/12/2021   Stage 3a chronic kidney disease (HCC) 06/14/2022   Unstageable pressure ulcer of sacral region (HCC) 03/10/2023    Medications:  Scheduled:   amiodarone   200 mg Oral BID   Chlorhexidine  Gluconate Cloth  6 each Topical Daily   cyanocobalamin   5,000 mcg Oral Q1200   methocarbamol   500 mg Oral QID   metoprolol  succinate  12.5 mg Oral BID   multivitamin with minerals  1 tablet Oral Daily   pantoprazole  (PROTONIX ) IV  40 mg Intravenous QHS   potassium chloride  SA  20 mEq Oral Daily   rosuvastatin   10 mg Oral Daily   sodium chloride  flush  10-40 mL Intracatheter Q12H   sodium chloride  flush  3 mL Intravenous Q12H   spironolactone   12.5 mg Oral Daily   tamsulosin   0.4 mg Oral Daily   torsemide   20 mg Oral BID   Infusions:   sodium chloride      albumin  human 60 mL/hr  at 09/06/23 0800   norepinephrine  (LEVOPHED ) Adult infusion Stopped (09/05/23 1300)    Assessment: 78 yo male who presented for decompression of lumbar burst fracture. PMH significant for permanent Afib (on Xarelto , LD 8/23), thoracic aortic aneurysm, CAD. Subsequently went into NSVT and failed cardioversion and adenosine. Now cleared by EP. Pharmacy consulted for heparin  management.  Chadsvasc = 4, Hgb (10.3) and plts (172) are stable. Given recent spinal procedure will avoid bolus and target low goal. Will check an aPTT to assess for falsely elevated heparin  levels with recent Xarelto  use.  Goal of Therapy:  Heparin  level 0.3-0.5 units/ml aPTT 66-85 seconds Monitor platelets by anticoagulation protocol: Yes   Plan:  Start heparin  infusion at 900 units/hr Check 8 hour heparin  level/aPTT Monitor aPTT and heparin  level until correlating Monitor daily, CBC and signs/symptoms of bleeding   Thank you for allowing pharmacy to be a part of this patient's care.   Nidia Schaffer, PharmD PGY2 Cardiology Pharmacy Resident  Please check AMION for all [facility] Pharmacy phone numbers After 10:00 PM, call Main Pharmacy [phone] 09/06/2023,9:34 AM

## 2023-09-06 NOTE — Progress Notes (Signed)
 PHARMACY - ANTICOAGULATION CONSULT NOTE  Pharmacy Consult for heparin  infusion Indication: atrial fibrillation  Allergies  Allergen Reactions   Cipro [Ciprofloxacin Hcl] Other (See Comments)    Increased risk of rupture  of ascending thoracic aortic aneurysm    Patient Measurements: Height: 5' 11 (180.3 cm) Weight: 75.2 kg (165 lb 12.6 oz) IBW/kg (Calculated) : 75.3 HEPARIN  DW (KG): 76.2  Vital Signs: Temp: 98.5 F (36.9 C) (08/30 1930) Temp Source: Oral (08/30 1930) BP: 106/64 (08/30 1040) Pulse Rate: 94 (08/30 1040)  Labs: Recent Labs    09/05/23 0312 09/05/23 1200 09/06/23 0500 09/06/23 1008 09/06/23 1900  HGB  --  10.3*  --  9.2*  --   HCT  --  30.8*  --  27.7*  --   PLT  --  172  --  161  --   APTT  --   --   --   --  62*  HEPARINUNFRC  --   --   --   --  <0.10*  CREATININE 1.65*  --  1.31*  --   --     Estimated Creatinine Clearance: 50.2 mL/min (A) (by C-G formula based on SCr of 1.31 mg/dL (H)).   Medical History: Past Medical History:  Diagnosis Date   Aneurysm of aortic root    Overview:  Last Assessment & Plan:  Planning to see Dr Army for evaluation of 5cm aneurysm.  - will check PFT in prep for possible procedure / SGY   Aortic atherosclerosis (HCC) 06/14/2022   Aortic regurgitation    Aortic valve insufficiency    Ascending aortic aneurysm (HCC) 09/11/2016   Atherosclerotic vascular disease 05/27/2022   Atrial fibrillation (HCC)    Atrial fibrillation with RVR (HCC) 07/05/2022   BMI 27.0-27.9,adult 06/14/2022   BPH (benign prostatic hyperplasia)    BRBPR (bright red blood per rectum) 05/20/2023   Cardiomyopathy (HCC) 09/11/2016   CHF (congestive heart failure) (HCC)    CKD (chronic kidney disease) stage 2, GFR 60-89 ml/min 11/23/2021   Closed fracture of right hip (HCC) 03/10/2023   Coronary artery calcification seen on CT scan 10/13/2017   Dysrhythmia    Essential hypertension 06/14/2022   Gastrointestinal hemorrhage 05/20/2023    Heart murmur    High risk medication use 07/31/2022   History of cardiomyopathy 11/09/2021   History of pulmonary embolism 09/24/2012   Overview:  Last Assessment & Plan:  Hx PE x 2 per notes, ? 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  - needs auto-titration study by [employer] or local company, data to RB to adjust his home device.    OSA (obstructive sleep apnea) 09/13/2022   Prediabetes    Primary osteoarthritis involving multiple joints 06/14/2022   Prostate cancer metastatic to bone (HCC) 06/14/2022   Pulmonary embolism (HCC)    Rhinitis    Sacral wound 04/11/2023   Seasonal allergies 06/14/2022   Spondylolisthesis of lumbar region 12/12/2021   Stage  3a chronic kidney disease (HCC) 06/14/2022   Unstageable pressure ulcer of sacral region (HCC) 03/10/2023    Medications:  Scheduled:   amiodarone   200 mg Oral BID   bisacodyl   10 mg Rectal Q0600   Chlorhexidine  Gluconate Cloth  6 each Topical Daily   cyanocobalamin   5,000 mcg Oral Q1200   methocarbamol   500 mg Oral QID   metoprolol  succinate  12.5 mg Oral BID   multivitamin with minerals  1 tablet Oral Daily   pantoprazole  (PROTONIX ) IV  40 mg Intravenous QHS   potassium chloride  SA  20 mEq Oral Daily   rosuvastatin   10 mg Oral Daily   sodium chloride  flush  10-40 mL Intracatheter Q12H   sodium chloride  flush  3 mL Intravenous Q12H   spironolactone   12.5 mg Oral  Daily   tamsulosin   0.4 mg Oral Daily   Infusions:   heparin  900 Units/hr (09/06/23 1220)   lactated ringers  40 mL/hr at 09/06/23 1130    Assessment: 78 yo male who presented for decompression of lumbar burst fracture. PMH significant for permanent Afib (on Xarelto , LD 8/23), thoracic aortic aneurysm, CAD. Subsequently went into NSVT and failed cardioversion and adenosine. Now cleared by EP. Pharmacy consulted for heparin  management.  Chadsvasc = 4, Hgb (10.3) and plts (172) are stable. Given recent spinal procedure will avoid bolus and target low goal.   Heparin  level <0.1 on heparin  at 900 units/hr.  Heparin  is subtherapeutic and will need rate increase.  Also confirms Xarelto  no longer in system and not falsely elevating heparin  levels.  Goal of Therapy:  Heparin  level 0.3-0.5 units/ml Monitor platelets by anticoagulation protocol: Yes   Plan:  Increase heparin  infusion to 1100 units/hr Check 8 hour heparin  level Discontinue aPTT monitoring Monitor daily heparin  level, CBC, and signs/symptoms of bleeding   Thank you for allowing pharmacy to be a part of this patient's care.    [employer], Pharm.D., BCPS Clinical Pharmacist  **Pharmacist phone directory can be found on [email or web address] listed under [facility].  09/06/2023 8:19 PM

## 2023-09-07 DIAGNOSIS — R579 Shock, unspecified: Secondary | ICD-10-CM | POA: Diagnosis not present

## 2023-09-07 DIAGNOSIS — I5022 Chronic systolic (congestive) heart failure: Secondary | ICD-10-CM | POA: Diagnosis not present

## 2023-09-07 DIAGNOSIS — I5020 Unspecified systolic (congestive) heart failure: Secondary | ICD-10-CM | POA: Diagnosis not present

## 2023-09-07 DIAGNOSIS — I4821 Permanent atrial fibrillation: Secondary | ICD-10-CM | POA: Diagnosis not present

## 2023-09-07 DIAGNOSIS — I4891 Unspecified atrial fibrillation: Secondary | ICD-10-CM | POA: Diagnosis not present

## 2023-09-07 DIAGNOSIS — S32012A Unstable burst fracture of first lumbar vertebra, initial encounter for closed fracture: Secondary | ICD-10-CM | POA: Diagnosis not present

## 2023-09-07 LAB — CBC
HCT: 24.8 % — ABNORMAL LOW (ref 39.0–52.0)
Hemoglobin: 8 g/dL — ABNORMAL LOW (ref 13.0–17.0)
MCH: 30.7 pg (ref 26.0–34.0)
MCHC: 32.3 g/dL (ref 30.0–36.0)
MCV: 95 fL (ref 80.0–100.0)
Platelets: 162 K/uL (ref 150–400)
RBC: 2.61 MIL/uL — ABNORMAL LOW (ref 4.22–5.81)
RDW: 15.3 % (ref 11.5–15.5)
WBC: 9.2 K/uL (ref 4.0–10.5)
nRBC: 0 % (ref 0.0–0.2)

## 2023-09-07 LAB — BASIC METABOLIC PANEL WITH GFR
Anion gap: 7 (ref 5–15)
BUN: 21 mg/dL (ref 8–23)
CO2: 29 mmol/L (ref 22–32)
Calcium: 8.7 mg/dL — ABNORMAL LOW (ref 8.9–10.3)
Chloride: 100 mmol/L (ref 98–111)
Creatinine, Ser: 1.26 mg/dL — ABNORMAL HIGH (ref 0.61–1.24)
GFR, Estimated: 59 mL/min — ABNORMAL LOW (ref >60)
Glucose, Bld: 95 mg/dL (ref 70–99)
Potassium: 3.5 mmol/L (ref 3.5–5.1)
Sodium: 136 mmol/L (ref 135–145)

## 2023-09-07 LAB — MAGNESIUM: Magnesium: 2.1 mg/dL (ref 1.7–2.4)

## 2023-09-07 LAB — COOXEMETRY PANEL
Carboxyhemoglobin: 1.6 % — ABNORMAL HIGH (ref 0.5–1.5)
Methemoglobin: 1.2 % (ref 0.0–1.5)
O2 Saturation: 78.9 %
Total hemoglobin: 8.4 g/dL — ABNORMAL LOW (ref 12.0–16.0)

## 2023-09-07 LAB — HEPARIN LEVEL (UNFRACTIONATED)
Heparin Unfractionated: 0.24 [IU]/mL — ABNORMAL LOW (ref 0.30–0.70)
Heparin Unfractionated: 0.15 [IU]/mL — ABNORMAL LOW (ref 0.30–0.70)

## 2023-09-07 LAB — PHOSPHORUS: Phosphorus: 2.4 mg/dL — ABNORMAL LOW (ref 2.5–4.6)

## 2023-09-07 MED ORDER — POTASSIUM CHLORIDE CRYS ER 20 MEQ PO TBCR
40.0000 meq | EXTENDED_RELEASE_TABLET | Freq: Once | ORAL | Status: AC
  Administered 2023-09-07: 40 meq via ORAL
  Filled 2023-09-07: qty 2

## 2023-09-07 MED ORDER — BISACODYL 10 MG RE SUPP
10.0000 mg | Freq: Every day | RECTAL | Status: DC
  Filled 2023-09-07 (×2): qty 1

## 2023-09-07 MED ORDER — POLYETHYLENE GLYCOL 3350 17 G PO PACK
17.0000 g | PACK | Freq: Two times a day (BID) | ORAL | Status: DC
  Administered 2023-09-07 – 2023-09-10 (×5): 17 g via ORAL
  Filled 2023-09-07 (×6): qty 1

## 2023-09-07 NOTE — Progress Notes (Signed)
 Assessment 78 y/o M w/ complex cardiac hx who underwent T10-L4 decompression partial corpectomy and fusion on 8/27. Postoperatively required cardioversion and anticoagulation  LOS: 4 days    Plan: Can transfer to general floor from nsgy standpoint AAT DAT Ok to continue therapeutic heparin . Would hold off on starting non-reversible medication such as Eliquis/Xarelto  during the holiday weekend. Will discuss restart date on Tuesday. Anticipate POD7 (Wed)   Subjective: Pt's back pain is slightly improved compared to yesterday. Off pressors. In TLSO brace. Peeing ok. +BM. Eating ok  Objective: Vital signs in last 24 hours: Temp:  [97.9 F (36.6 C)-99 F (37.2 C)] 99 F (37.2 C) (08/31 0730) Pulse Rate:  [74-93] 90 (08/31 1050) Resp:  [14-23] 20 (08/31 1000) BP: (91-119)/(38-50) 119/41 (08/31 1050) SpO2:  [88 %-99 %] 92 % (08/31 1000) Arterial Line BP: (93-144)/(34-73) 117/37 (08/31 0600)  Intake/Output from previous day: 08/30 0701 - 08/31 0700 In: 974.9 [I.V.:915; IV Piggyback:59.9] Out: 1850 [Urine:1850] Intake/Output this shift: Total I/O In: 120 [P.O.:120] Out: -   Exam: Incision covered in honeycomb dressing Awake, alert 5/5 b/l hand grip RLE: 5/5 IP, quad, ham, PF. 0/5 DF, EHL LLE: 5/5 IP, quad, ham. 3/5 DF, EHL. 5/5 PF  Lab Results: Recent Labs    09/06/23 1008 09/07/23 0500  WBC 10.2 9.2  HGB 9.2* 8.0*  HCT 27.7* 24.8*  PLT 161 162   BMET Recent Labs    09/06/23 0500 09/07/23 0500  NA 135 136  K 3.4* 3.5  CL 95* 100  CO2 28 29  GLUCOSE 108* 95  BUN 22 21  CREATININE 1.31* 1.26*  CALCIUM  8.6* 8.7*    Studies/Results: ECHOCARDIOGRAM COMPLETE Result Date: 09/05/2023    ECHOCARDIOGRAM REPORT   Patient Name:   MAOR MECKEL Loken Date of Exam: 09/05/2023 Medical Rec #:  [id number]        Height:       71.0 in Accession #:    [id number]       Weight:       172.0 lb Date of Birth:  05-01-1945        BSA:          1.977 m Patient Age:    78 years          BP:           97/49 mmHg Patient Gender: M                HR:           94 bpm. Exam Location:  Inpatient Procedure: 2D Echo, Cardiac Doppler and Color Doppler (Both Spectral and Color            Flow Doppler were utilized during procedure). Indications:    CHF  History:        Patient has prior history of Echocardiogram examinations, most                 recent 07/04/2023. Cardiomyopathy, CAD, Aortic Valve Disease,                 Arrythmias:Atrial Fibrillation; Risk Factors:Hypertension.  Sonographer:    Jayson Gaskins Referring Phys: [phone] DALTON S MCLEAN IMPRESSIONS  1. Left ventricular ejection fraction, by estimation, is 30 to 35%. Left ventricular ejection fraction by 2D MOD biplane is 33.0 %. The left ventricle has moderately decreased function. The left ventricle demonstrates global hypokinesis. Left ventricular diastolic function could not be evaluated.  2. Right ventricular systolic function is mildly reduced. The  right ventricular size is normal.  3. Left atrial size was moderately dilated.  4. Right atrial size was mildly dilated.  5. The mitral valve is degenerative. Mild mitral valve regurgitation.  6. The aortic valve was not well visualized. Aortic valve regurgitation is moderate.  7. Aortic dilatation noted. There is severe dilatation of the ascending aorta, measuring 56 mm.  8. Rhythm strip during this exam demonstrates premature ventricular contractions and atrial fibrillation. Comparison(s): Changes from prior study are noted. 07/04/2023: LVEF 35-40%. Conclusion(s)/Recommendation(s): Critical findings reported to Dr. Rolan and acknowledged at 09/05/2023 at 1:05 pm. FINDINGS  Left Ventricle: Left ventricular ejection fraction, by estimation, is 30 to 35%. Left ventricular ejection fraction by 2D MOD biplane is 33.0 %. The left ventricle has moderately decreased function. The left ventricle demonstrates global hypokinesis. The left ventricular internal cavity size was normal in size. There is no left  ventricular hypertrophy. Left ventricular diastolic function could not be evaluated due to atrial fibrillation. Left ventricular diastolic function could not be evaluated. Right Ventricle: The right ventricular size is normal. No increase in right ventricular wall thickness. Right ventricular systolic function is mildly reduced. Left Atrium: Left atrial size was moderately dilated. Right Atrium: Right atrial size was mildly dilated. Pericardium: There is no evidence of pericardial effusion. Mitral Valve: The mitral valve is degenerative in appearance. There is mild calcification of the anterior and posterior mitral valve leaflet(s). Mild mitral valve regurgitation. Tricuspid Valve: The tricuspid valve is grossly normal. Tricuspid valve regurgitation is mild. Aortic Valve: The aortic valve was not well visualized. Aortic valve regurgitation is moderate. Aortic valve mean gradient measures 7.0 mmHg. Aortic valve peak gradient measures 10.6 mmHg. Pulmonic Valve: The pulmonic valve was normal in structure. Pulmonic valve regurgitation is not visualized. Aorta: Aortic dilatation noted. There is severe dilatation of the ascending aorta, measuring 56 mm. IAS/Shunts: No atrial level shunt detected by color flow Doppler. EKG: Rhythm strip during this exam demonstrates premature ventricular contractions and atrial fibrillation.  LEFT VENTRICLE PLAX 2D                        Biplane EF (MOD) LVIDd:         5.00 cm         LV Biplane EF:   Left LVIDs:         4.00 cm                          ventricular LV PW:         0.80 cm                          ejection LV IVS:        0.90 cm                          fraction by                                                 2D MOD  biplane is LV Volumes (MOD)                                33.0 %. LV vol d, MOD    189.4 ml A2C:                           Diastology LV vol d, MOD    230.1 ml      LV e' lateral: 11.60 cm/s A4C: LV vol s, MOD     122.7 ml A2C: LV vol s, MOD    163.0 ml A4C: LV SV MOD A2C:   66.7 ml LV SV MOD A4C:   230.1 ml LV SV MOD BP:    69.6 ml RIGHT VENTRICLE RV S prime:     17.30 cm/s LEFT ATRIUM           Index LA Vol (A2C): 68.5 ml 34.64 ml/m LA Vol (A4C): 91.9 ml 46.47 ml/m  AORTIC VALVE AV Vmax:           163.00 cm/s AV Vmean:          128.000 cm/s AV VTI:            0.279 m AV Peak Grad:      10.6 mmHg AV Mean Grad:      7.0 mmHg LVOT Vmax:         132.00 cm/s LVOT Vmean:        106.000 cm/s LVOT VTI:          0.233 m LVOT/AV VTI ratio: 0.84  SHUNTS Systemic VTI: 0.23 m Vinie Maxcy MD Electronically signed by Vinie Maxcy MD Signature Date/Time: 09/05/2023/1:07:50 PM    Final       Dorn JONELLE Glade 09/07/2023, 11:41 AM

## 2023-09-07 NOTE — PMR Pre-admission (Signed)
 PMR Admission Coordinator Pre-Admission Assessment  Patient: Aaron Richmond is an 78 y.o., male MRN: [id number] DOB: 01/28/1945 Height: 5' 11 (180.3 cm) Weight: 75.2 kg              Insurance Information HMO:  yes   PPO:      PCP:      IPA:      80/20:      OTHER:  PRIMARY: [employer]      Policy#: ***      Subscriber: *** CM Name: ***      Phone#: ***     Fax#: *** Pre-Cert#: ***      Employer: *** Benefits:  Phone #: ***     Name: *** Eff. Date: ***     Deduct: ***      Out of Pocket Max: ***      Life Max: ***  CIR: ***      SNF: *** Outpatient: ***     Co-Pay: *** Home Health: ***      Co-Pay: *** DME: ***     Co-Pay: *** Providers: *** SECONDARY:       Policy#:       Phone#:   Financial Counselor:       Phone#:   The "Data Collection Information Summary" for patients in Inpatient Rehabilitation Facilities with attached "Privacy Act Statement-Health Care Records" was provided and verbally reviewed with: Patient  Emergency Contact Information Contact Information     Name Relation Home Work Mobile   Aaron Richmond Spouse [phone]  [phone]   Aaron Richmond, Aaron Richmond   [phone]      Other Contacts   None on File    Current Medical History  Patient Admitting Diagnosis: Spinal Cord Compression  History of Present Illness: Aaron Richmond is a 78 y.o. male who has past medical history of aortic root aneurysm, CHF, CKD, hypertension, lumbar radiculopathy, prediabetes, CKD, heart failure, OSA, A-fib, CAD who was admitted for spinal fusion after he was found to have severe spinal cord compression at L1 burst fracture status post kyphoplasty.  Patient had history of prior L4 S1 fusion as well as kyphoplasty but developed progressive back pain, numbness and weakness in his legs.  Pt. Was admitted to Nuerosurgery service ad [facility] on 09/03/2023 and  had T10-L4 fusion by Dr. Onetha on 09/03/2023.  Post operatively, Patient developed narrow complex  tachycardia with hypotension, initially failed cardioversion attempts but later converted to A-fib RVR with 360 J shock with improved pressures.  Patient treated with amiodarone  and pressors for hypotension. Prior to admission pt reports limited mobility due to back pain. He was ambulating with the walker mostly for shorter distances. Has chronic R foot drop that started after hip surgery, reports he had recent EMG/NCS. Has a AFO for his RLE.   Patient was seen by PT and OT and found to have functional deficits and felt to be a candidate for inpatient rehabilitation.    Glasgow Coma Scale Score: 15  Patient's medical record from [facility] has been reviewed by the rehabilitation admission coordinator and physician.  Past Medical History  Past Medical History:  Diagnosis Date   Aneurysm of aortic root    Overview:  Last Assessment & Plan:  Planning to see Dr Army for evaluation of 5cm aneurysm.  - will check PFT in prep for possible procedure / SGY   Aortic atherosclerosis (HCC) 06/14/2022   Aortic regurgitation    Aortic valve insufficiency  Ascending aortic aneurysm (HCC) 09/11/2016   Atherosclerotic vascular disease 05/27/2022   Atrial fibrillation (HCC)    Atrial fibrillation with RVR (HCC) 07/05/2022   BMI 27.0-27.9,adult 06/14/2022   BPH (benign prostatic hyperplasia)    BRBPR (bright red blood per rectum) 05/20/2023   Cardiomyopathy (HCC) 09/11/2016   CHF (congestive heart failure) (HCC)    CKD (chronic kidney disease) stage 2, GFR 60-89 ml/min 11/23/2021   Closed fracture of right hip (HCC) 03/10/2023   Coronary artery calcification seen on CT scan 10/13/2017   Dysrhythmia    Essential hypertension 06/14/2022   Gastrointestinal hemorrhage 05/20/2023   Heart murmur    High risk medication use 07/31/2022   History of cardiomyopathy 11/09/2021   History of pulmonary embolism 09/24/2012   Overview:  Last Assessment & Plan:  Hx PE x 2 per notes, ? 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  - needs auto-titration study by [employer] or local company, data to RB to adjust his home device.    OSA (obstructive sleep apnea) 09/13/2022   Prediabetes    Primary osteoarthritis involving multiple joints 06/14/2022   Prostate cancer metastatic to bone (HCC) 06/14/2022   Pulmonary embolism (HCC)    Rhinitis    Sacral wound 04/11/2023   Seasonal allergies 06/14/2022   Spondylolisthesis of lumbar region 12/12/2021   Stage 3a chronic kidney disease (HCC) 06/14/2022   Unstageable pressure ulcer of sacral region [facility]) 03/10/2023    Has the patient had major surgery during 100 days prior to admission? Yes  Family History  family history includes Alzheimer's disease in his mother; Diabetes Mellitus II in his father; Heart attack in his mother; Stroke in his father and mother.   Current Medications   Current Facility-Administered Medications:    acetaminophen  (TYLENOL ) tablet 650 mg, 650 mg, Oral, Q4H PRN **OR** acetaminophen  (TYLENOL ) suppository 650 mg, 650 mg, Rectal, Q4H PRN, Onetha Kuba, MD   alum & mag hydroxide-simeth (MAALOX/MYLANTA) 200-200-20 MG/5ML suspension 30 mL, 30 mL, Oral, Q6H PRN, Onetha Kuba, MD   amiodarone  (PACERONE ) tablet 200 mg, 200 mg, Oral, BID, Colletta Manuelita Garre, PA-C, 200 mg at 09/07/23 1050   bisacodyl  (DULCOLAX) suppository 10 mg, 10 mg, Rectal, Q0600, Claudene Toribio BROCKS, MD, 10 mg at 09/07/23 9357   Chlorhexidine  Gluconate Cloth 2 % PADS 6 each, 6 each, Topical, Daily, Onetha Kuba, MD, 6 each at 09/07/23 1000   cyanocobalamin  (VITAMIN B12) tablet 5,000 mcg, 5,000 mcg, Oral, Q1200, Onetha Kuba, MD, 5,000 mcg at 09/07/23 1104   heparin  ADULT infusion 100 units/mL (25000 units/250mL), 1,100 Units/hr, Intravenous, Continuous, Hammons, Kimberly B, RPH, Last Rate: 11 mL/hr at 09/07/23 0600, 1,100 Units/hr at 09/07/23 0600   HYDROcodone -acetaminophen  (NORCO/VICODIN) 5-325 MG per tablet 2 tablet, 2 tablet, Oral, Q4H PRN, Onetha Kuba, MD, 2 tablet at 09/07/23 1103   HYDROmorphone  (DILAUDID ) injection 0.5 mg, 0.5 mg, Intravenous, Q2H PRN, Onetha Kuba, MD, 0.5 mg at 09/03/23 1927  menthol -cetylpyridinium (CEPACOL) lozenge 3 mg, 1 lozenge, Oral, PRN **OR** phenol (CHLORASEPTIC) mouth spray 1 spray, 1 spray, Mouth/Throat, PRN, Onetha Kuba, MD   methocarbamol  (ROBAXIN ) tablet 500 mg, 500 mg, Oral, QID, Onetha Kuba, MD, 500 mg at 09/07/23 1050   metoprolol  succinate (TOPROL -XL) 24 hr tablet 12.5 mg, 12.5 mg, Oral, BID, McLean, Dalton S, MD, 12.5 mg at 09/07/23 1050   multivitamin with minerals tablet 1 tablet, 1 tablet, Oral, Daily, Onetha Kuba, MD, 1 tablet at 09/07/23 1050   ondansetron  (ZOFRAN ) tablet 4 mg, 4 mg, Oral, Q6H PRN, 4 mg at 09/05/23 1703 **OR** ondansetron  (ZOFRAN ) injection 4 mg, 4 mg, Intravenous, Q6H PRN, Onetha Kuba, MD   Oral care mouth rinse, 15 mL, Mouth Rinse, PRN, Onetha Kuba, MD   pantoprazole  (PROTONIX ) injection 40 mg, 40 mg, Intravenous, QHS, Onetha Kuba, MD, 40 mg at 09/06/23 2136   polyethylene glycol (MIRALAX  / GLYCOLAX ) packet 17 g, 17 g, Oral, Daily PRN, Onetha Kuba, MD, 17 g at 09/05/23 1703   polyethylene glycol (MIRALAX  / GLYCOLAX ) packet 17 g, 17 g, Oral, BID, Claudene Toribio BROCKS, MD, 17 g at 09/07/23 1106   potassium chloride  SA (KLOR-CON  M) CR tablet 20 mEq, 20 mEq, Oral, Daily, Onetha Kuba, MD, 20 mEq at 09/07/23 1100   rosuvastatin  (CRESTOR )  tablet 10 mg, 10 mg, Oral, Daily, McLean, Dalton S, MD, 10 mg at 09/07/23 1050   sodium chloride  flush (NS) 0.9 % injection 10-40 mL, 10-40 mL, Intracatheter, Q12H, Claudene Toribio BROCKS, MD, 10 mL at 09/07/23 1000   sodium chloride  flush (NS) 0.9 % injection 10-40 mL, 10-40 mL, Intracatheter, PRN, Claudene Toribio BROCKS, MD   sodium chloride  flush (NS) 0.9 % injection 3 mL, 3 mL, Intravenous, Q12H, Onetha Kuba, MD, 3 mL at 09/06/23 2137   sodium chloride  flush (NS) 0.9 % injection 3 mL, 3 mL, Intravenous, PRN, Onetha Kuba, MD   spironolactone  (ALDACTONE ) tablet 12.5 mg, 12.5 mg, Oral, Daily, Rolan, Dalton S, MD, 12.5 mg at 09/07/23 1050   tamsulosin  (FLOMAX ) capsule 0.4 mg, 0.4 mg, Oral, Daily, Onetha Kuba, MD, 0.4 mg at 09/07/23 1050   traMADol  (ULTRAM ) tablet 100 mg, 100 mg, Oral, Q8H PRN, Onetha Kuba, MD, 100 mg at 09/06/23 9647  Patients Current Diet:  Diet Order             Diet regular Room service appropriate? Yes; Fluid consistency: Thin  Diet effective now                   Precautions / Restrictions Precautions Precautions: Back Precaution Booklet Issued: No Precaution/Restrictions Comments: able to state 3/3 precautions Spinal Brace: Lumbar corset Other Brace: AFO RLE Restrictions Weight Bearing Restrictions Per Provider Order: No   Has the patient had 2 or more falls or a fall with injury in the past year? Yes   Prior Activity Level  Pt. Active in the community PTA  Prior Functional Level Prior Function Prior Level of Function : Needs assist Mobility Comments: Ambulating with RW, Fall earlier this year resulting in Rt hip fracture ADLs Comments: Needs assist for LB dressing, wife does IADL  Self Care: Did the patient need help bathing, dressing, using the toilet or eating?  Independent  Indoor Mobility: Did the patient need assistance with walking from room to room (with or without device)? Independent  Stairs: Did the patient need assistance with internal or external  stairs (with or without device)? Independent  Functional Cognition: Did the patient need help planning regular tasks such  as shopping or remembering to take medications? Independent  Patient Information Are you of Hispanic, Latino/a,or Spanish origin?: A. No, not of Hispanic, Latino/a, or Spanish origin What is your race?: A. White Do you need or want an interpreter to communicate with a doctor or health care staff?: 0. No  Patient's Response To:  Health Literacy and Transportation Is the patient able to respond to health literacy and transportation needs?: Yes Health Literacy - How often do you need to have someone help you when you read instructions, pamphlets, or other written material from your doctor or pharmacy?: Never In the past 12 months, has lack of transportation kept you from medical appointments or from getting medications?: No In the past 12 months, has lack of transportation kept you from meetings, work, or from getting things needed for daily living?: No  Home Assistive Devices / Equipment Home Equipment: Agricultural consultant (2 wheels), [employer] - single point, Information systems manager, Retail buyer, Rollator (4 wheels)  Prior Device Use: Indicate devices/aids used by the patient prior to current illness, exacerbation or injury? None of the above  Current Functional Level Cognition  Orientation Level: Oriented X4    Extremity Assessment (includes Sensation/Coordination)  Upper Extremity Assessment: Generalized weakness  Lower Extremity Assessment: Defer to PT evaluation    ADLs  Overall ADL's : Needs assistance/impaired Eating/Feeding: Set up Grooming: Set up, Sitting Grooming Details (indicate cue type and reason): limited by A line in dominant hand, asking RN and staff to assist with hand to mouth bc of this Upper Body Bathing: Moderate assistance Lower Body Bathing: Maximal assistance Upper Body Dressing : Maximal assistance Upper Body Dressing Details (indicate cue type and reason):  doffing brace Lower Body Dressing: Maximal assistance Lower Body Dressing Details (indicate cue type and reason): doff socks, unable to perform figure 4 Toilet Transfer: Moderate assistance, Stand-pivot (1 person HHA) Toilet Transfer Details (indicate cue type and reason): face to face simulated through return to bed Toileting- Clothing Manipulation and Hygiene: Maximal assistance, Bed level Functional mobility during ADLs: Moderate assistance (1 person HHA/face to face)    Mobility  Overal bed mobility: Needs Assistance Bed Mobility: Rolling, Sit to Sidelying Rolling: Min assist, Used rails Sidelying to sit: Min assist, Used rails Sit to sidelying: Mod assist, Used rails General bed mobility comments: in recliner when PT entered room    Transfers  Overall transfer level: Needs assistance Equipment used: Standard walker Transfers: Sit to/from Stand Sit to Stand: Mod assist Bed to/from chair/wheelchair/BSC transfer type:: Step pivot Step pivot transfers: Mod assist General transfer comment: Mod assist for boost to stand with cues for hand placement and hip hinge to maintain spinal precautions. Improved balance in standing without retropulsion today. Pt able to stand for ~ 3 min    Ambulation / Gait / Stairs / Wheelchair Mobility  Ambulation/Gait Ambulation/Gait assistance: Min assist, +2 physical assistance, +2 safety/equipment Gait Distance (Feet): 3 Feet Assistive device: Standard walker Gait Pattern/deviations: Step-through pattern General Gait Details: short partial step through gait pattern with mid foot initial contact. HR intermittently up to 147 bpm therefore limited at this time. Gait velocity: decreased Gait velocity interpretation: <1.31 ft/sec, indicative of household ambulator Pre-gait activities: Engineer, water march, limited, mod assist.    Posture / Balance Dynamic Sitting Balance Sitting balance - Comments: requires UE support in chair Balance Overall balance  assessment: Needs assistance Sitting-balance support: Feet supported, Single extremity supported Sitting balance-Leahy Scale: Fair Sitting balance - Comments: requires UE support in chair Standing balance support: Bilateral upper  extremity supported, During functional activity, Reliant on assistive device for balance Standing balance-Leahy Scale: Poor    Special considerations/ Life events Special service needs ***     Previous Home Environment (from acute therapy documentation) Living Arrangements: Spouse/significant other  Lives With: Spouse Available Help at Discharge: Family, Available 24 hours/day Type of Home: House Home Layout: One level Home Access: Stairs to enter Entrance Stairs-Rails: Left, Right Entrance Stairs-Number of Steps: 1 Bathroom Shower/Tub: Health visitor: Standard Bathroom Accessibility: Yes How Accessible: Accessible via walker Home Care Services: No  Discharge Living Setting Plans for Discharge Living Setting: Patient's home Type of Home at Discharge: House Discharge Home Layout: One level Discharge Home Access: Stairs to enter Entrance Stairs-Rails: Right, Left Entrance Stairs-Number of Steps: 1 Discharge Bathroom Shower/Tub: Walk-in shower Discharge Bathroom Toilet: Standard Discharge Bathroom Accessibility: Yes How Accessible: Accessible via walker  Social/Family/Support Systems Patient Roles: Spouse Contact Information: [phone] Caregiver Availability: 24/7 Discharge Plan Discussed with Primary Caregiver: Yes Is Caregiver In Agreement with Plan?: Yes Does Caregiver/Family have Issues with Lodging/Transportation while Pt is in Rehab?: No   Goals Patient/Family Goal for Rehab: PT/OT Min A- Supervision Expected length of stay: 10-12 days Pt/Family Agrees to Admission and willing to participate: Yes Program Orientation Provided & Reviewed with Pt/Caregiver Including Roles  & Responsibilities: Yes   Decrease burden of  Care through IP rehab admission: not anticipated   Possible need for SNF placement upon discharge:not anticipated   Patient Condition: {PATIENT'S CONDITION:22832}  Preadmission Screen Completed By:  Leita KATHEE Kleine, CCC-SLP, 09/07/2023 1:45 PM ______________________________________________________________________   Discussed status with Dr. PIERRETTEon***at *** and received approval for admission today.  Admission Coordinator:  Leita KATHEE Kleine, time***/Date***

## 2023-09-07 NOTE — Progress Notes (Signed)
 PHARMACY - ANTICOAGULATION CONSULT NOTE  Pharmacy Consult for heparin  infusion Indication: atrial fibrillation  Allergies  Allergen Reactions   Cipro [Ciprofloxacin Hcl] Other (See Comments)    Increased risk of rupture  of ascending thoracic aortic aneurysm    Patient Measurements: Height: 5' 11 (180.3 cm) Weight: 75.2 kg (165 lb 12.6 oz) IBW/kg (Calculated) : 75.3 HEPARIN  DW (KG): 76.2  Vital Signs: BP: 108/44 (08/31 2200) Pulse Rate: 116 (08/31 2200)  Labs: Recent Labs    09/05/23 0312 09/05/23 1200 09/05/23 1200 09/06/23 0500 09/06/23 1008 09/06/23 1900 09/07/23 0500 09/07/23 0953 09/07/23 2200  HGB  --  10.3*   < >  --  9.2*  --  8.0*  --   --   HCT  --  30.8*  --   --  27.7*  --  24.8*  --   --   PLT  --  172  --   --  161  --  162  --   --   APTT  --   --   --   --   --  62*  --   --   --   HEPARINUNFRC  --   --   --   --   --  <0.10*  --  0.24* 0.15*  CREATININE 1.65*  --   --  1.31*  --   --  1.26*  --   --    < > = values in this interval not displayed.    Estimated Creatinine Clearance: 52.2 mL/min (A) (by C-G formula based on SCr of 1.26 mg/dL (H)).   Assessment: 78 yo male who presented for decompression of lumbar burst fracture. PMH significant for permanent Afib (on Xarelto , LD 8/23), thoracic aortic aneurysm, CAD. Subsequently went into NSVT and failed cardioversion and adenosine. Now cleared by EP. Pharmacy consulted for heparin  management.  Heparin  level remains subtherapeutic and down further to 0.15 on heparin  infusion at 1150 units/hr. No issues with line or bleeding reported per RN.  Goal of Therapy:  Heparin  level 0.3-0.5 units/ml Monitor platelets by anticoagulation protocol: Yes   Plan:  Increase heparin  infusion to 1300 units/hr Check 8 hour heparin  level F/u neurosurgery plan to transition to PTA DOAC (likely 9/3 per note)  Thank you for allowing pharmacy to be a part of this patient's care.   Vito Ralph, PharmD, BCPS Please  see amion for complete clinical pharmacist phone list 09/07/2023,10:36 PM

## 2023-09-07 NOTE — Progress Notes (Signed)
 Patient ID: Aaron Richmond, male   DOB: [birth year], 78 y.o.   MRN: [id number]     Advanced Heart Failure Rounding Note  Cardiologist: None  Chief Complaint: CHF Subjective:    Stable BP with wide pulse pressure.  Has stayed off NE.  Co-ox 79% with CVP 8 (got LR infusion overnight), creatinine 1.65 => 1.31 => 1.26.  HR 70s-80s, atrial fibrillation.   No dyspnea.    Objective:   Weight Range: 75.2 kg Body mass index is 23.12 kg/m.   Vital Signs:   Temp:  [97.9 F (36.6 C)-99 F (37.2 C)] 99 F (37.2 C) (08/31 0730) Pulse Rate:  [74-94] 84 (08/31 0800) Resp:  [14-23] 17 (08/31 0800) BP: (91-110)/(38-64) 99/46 (08/31 0800) SpO2:  [88 %-99 %] 94 % (08/31 0800) Arterial Line BP: (93-144)/(34-73) 117/37 (08/31 0600) Last BM Date :  (pta)  Weight change: Filed Weights   09/03/23 0601 09/04/23 0555 09/06/23 0652  Weight: 76.2 kg 78 kg 75.2 kg    Intake/Output:   Intake/Output Summary (Last 24 hours) at 09/07/2023 0902 Last data filed at 09/07/2023 9357 Gross per 24 hour  Intake 915.02 ml  Output 1850 ml  Net -934.98 ml      Physical Exam    General: NAD Neck: No JVD, no thyromegaly or thyroid  nodule.  Lungs: Clear to auscultation bilaterally with normal respiratory effort. CV: Nondisplaced PMI.  Heart irregular S1/S2, no S3/S4, 1/6 SEM RUSB.  No peripheral edema.   Abdomen: Soft, nontender, no hepatosplenomegaly, no distention.  Skin: Intact without lesions or rashes.  Neurologic: Alert and oriented x 3.  Psych: Normal affect. Extremities: No clubbing or cyanosis.  HEENT: Normal.   Telemetry   Atrial fibrillation rate 70s-80s (personally reviewed)  Labs    CBC Recent Labs    09/06/23 1008 09/07/23 0500  WBC 10.2 9.2  HGB 9.2* 8.0*  HCT 27.7* 24.8*  MCV 93.6 95.0  PLT 161 162   Basic Metabolic Panel Recent Labs    91/69/74 0500 09/06/23 0512 09/07/23 0500  NA 135  --  136  K 3.4*  --  3.5  CL 95*  --  100  CO2 28  --  29  GLUCOSE 108*  --   95  BUN 22  --  21  CREATININE 1.31*  --  1.26*  CALCIUM  8.6*  --  8.7*  MG  --  2.2 2.1  PHOS  --   --  2.4*   Liver Function Tests No results for input(s): AST, ALT, ALKPHOS, BILITOT, PROT, ALBUMIN  in the last 72 hours. No results for input(s): LIPASE, AMYLASE in the last 72 hours. Cardiac Enzymes No results for input(s): CKTOTAL, CKMB, CKMBINDEX, TROPONINI in the last 72 hours.  BNP: BNP (last 3 results) No results for input(s): BNP in the last 8760 hours.  ProBNP (last 3 results) Recent Labs    11/08/22 1623  PROBNP 1,382*     D-Dimer No results for input(s): DDIMER in the last 72 hours. Hemoglobin A1C No results for input(s): HGBA1C in the last 72 hours. Fasting Lipid Panel No results for input(s): CHOL, HDL, LDLCALC, TRIG, CHOLHDL, LDLDIRECT in the last 72 hours. Thyroid  Function Tests No results for input(s): TSH, T4TOTAL, T3FREE, THYROIDAB in the last 72 hours.  Invalid input(s): FREET3  Other results:   Imaging    No results found.    Medications:     Scheduled Medications:  amiodarone   200 mg Oral BID   bisacodyl   10 mg Rectal  [id number]   Chlorhexidine  Gluconate Cloth  6 each Topical Daily   cyanocobalamin   5,000 mcg Oral Q1200   methocarbamol   500 mg Oral QID   metoprolol  succinate  12.5 mg Oral BID   multivitamin with minerals  1 tablet Oral Daily   pantoprazole  (PROTONIX ) IV  40 mg Intravenous QHS   potassium chloride  SA  20 mEq Oral Daily   potassium chloride   40 mEq Oral Once   rosuvastatin   10 mg Oral Daily   sodium chloride  flush  10-40 mL Intracatheter Q12H   sodium chloride  flush  3 mL Intravenous Q12H   spironolactone   12.5 mg Oral Daily   tamsulosin   0.4 mg Oral Daily    Infusions:  heparin  1,100 Units/hr (09/07/23 0600)    PRN Medications: acetaminophen  **OR** acetaminophen , alum & mag hydroxide-simeth, HYDROcodone -acetaminophen , HYDROmorphone  (DILAUDID ) injection,  menthol -cetylpyridinium **OR** phenol, ondansetron  **OR** ondansetron  (ZOFRAN ) IV, mouth rinse, polyethylene glycol, sodium chloride  flush, sodium chloride  flush, traMADol    Assessment/Plan   1. Shock: Patient was on NE x 3 days post-op.  He has a wide pulse pressure likely due to his moderate AI.  BP is very stable off NE, SBP 110s-120s.  Echo is stable compared to prior, EF 30-35%, mild RV dysfunction, ascending aorta 56 mm with moderate AI.  Prior echo similar with EF 35-40%, moderate AI. Excellent co-ox 79%.  - He can stay off NE, would aim for systolic BP >/= 100 rather than using MAP.   2. Chronic systolic CHF: Nonischemic CMP.  Echo showed EF 30-35%, mild RV dysfunction, ascending aorta 56 mm with moderate AI. Nonischemic cardiomyopathy, coronary CTA in 7/25 showed extensive but nonobstructive CAD.  Cardiomyopathy may be due to permanent AF, ?elevated HR at times. He does not look volume overloaded on exam. CVP 8 today with co-ox 79%. Creatinine better at 1.26.  - Would like to keep HR better-controlled given concern for tachy-mediated CMP.  Continue Toprol  XL 12.5 mg bid, HR now in 70s-80s.  - Continue spironolactone  12.5 daily.  - Can continue to hold home torsemide , probably restart home torsemide  20 mg bid tomorrow.  - Would like him to eventually get a cardiac MRI, with back operation he does not think he could lie flat for a prolonged period of time in the MRI scanner at this point.   3. Atrial fibrillation: Permanent.  Did not cardiovert to NSR when shocked for SVT. Better rate control today.  - Continue Toprol  XL.   - Continue heparin  gtt (begun yesterday).  Restart Xarelto  when ok with neurosurgery.  4. SVT: Patient had rapid SVT post-op, required DCCV.  Now on amiodarone .  - Would continue amiodarone  200 mg bid for now.  5. CAD: Nonobstructive but extensive CAD on coronary CTA.  - Continue statin.  - No ASA with anticoagulation.  6. AKI: Creatinine up to 1.6 initially.  Now down  to 1.26.   7. Dilated aortic root/ascending aorta with moderate AI: 56 mm ascending aorta on echo, this is roughly stable from last CTA.   - Close followup with TCTS as outpatient.  - Moderate AI is likely causing the wide pulse pressure.  8. S/p decompression L1, fusion T10-L4: Per neurosurgery.  Needs PT.  - Graded compression stockings to help with orthostasis.   OK for progressive unit transfer from my standpoint.   Length of Stay: 4  Ezra Shuck, MD  09/07/2023, 9:02 AM  Advanced Heart Failure Team Pager [phone] (M-F; 7a - 5p)  Please contact [facility] for night-coverage  after hours (5p -7a ) and weekends on [email or web address]

## 2023-09-07 NOTE — Consult Note (Addendum)
 This is a progress note    NAME:  Aaron Richmond, MRN:  [id number], DOB:  09/27/1945, LOS: 4 ADMISSION DATE:  09/03/2023, CONSULTATION DATE:  09/05/23 REFERRING MD:  Onetha, CHIEF COMPLAINT:  shock    History of Present Illness:  78 yo M PMH Afib, HFrEF, thoracic aortic aneurysm, CAD admitted to NSGY service 8/27 for transpedicular decompression L1, instrumented fusion T10-L4 for severe L1 cord compression / burst fracture. Preceding sx RLE numbness/tingling+ back pain.   Sounds like case was pretty unremarkable. Post op significant for unstable SVT. Cards consulted. Failed cardioversion x2 at 200J, failed adenosine 6 & 12. Failed add'l 200J CV attempt, and converted to Afib RVR with 360J CV, with improvement in blood pressures   Pertinent  Medical History   Afib HFrEF  CAD Kyphosis   Significant Hospital Events: Including procedures, antibiotic start and stop dates in addition to other pertinent events   8/27 planned L1 decompression T10-L4 instrumentation fusion. Post op unstable SVT cards consulted  8/28 remained on 10 NE 8/29 remained on NE. Ep signed off. Pharmacy/nursing notified PCCM and requested consultation   Interim History / Subjective:   No events Still no BM  Objective    Blood pressure (!) 107/47, pulse 93, temperature 99 F (37.2 C), temperature source Oral, resp. rate 20, height 5' 11 (1.803 m), weight 75.2 kg, SpO2 92%. CVP:  [3 mmHg-9 mmHg] 6 mmHg      Intake/Output Summary (Last 24 hours) at 09/07/2023 1045 Last data filed at 09/07/2023 9357 Gross per 24 hour  Intake 915.02 ml  Output 1850 ml  Net -934.98 ml   Filed Weights   09/03/23 0601 09/04/23 0555 09/06/23 0652  Weight: 76.2 kg 78 kg 75.2 kg    Examination: No distress Heart irregular Ext warm Abd soft hypoactive BS, nontender Moves ext, may be a bit weaker on LLE Arthritic changes of hands  Resolved problem list   Assessment and Plan   L1 burst fx and cord compression S/p  thoracolumbar fusion- T10-L4 fusion  Shock state- improved/resolved with hydration Afib RVR- improved with hydration and amio+BB Underlying HFrEF AKI- improved with hydration Constipation- still an issue  - BP goal: SBP > 100 - Continue mobility efforts - PO BB and amio per cardiology - Encourage PO fluid - Enema today - Okay for transfer to cardiac tele for ongoing care, appreciate TR[facility] taking over 09/08/23 - Heparin  gtt, transition to NoAC when cleared by NSGY, could be at t+1 week from surgery  Rolan Sharps MD PCCM

## 2023-09-07 NOTE — Progress Notes (Signed)
 PHARMACY - ANTICOAGULATION CONSULT NOTE  Pharmacy Consult for heparin  infusion Indication: atrial fibrillation  Allergies  Allergen Reactions   Cipro [Ciprofloxacin Hcl] Other (See Comments)    Increased risk of rupture  of ascending thoracic aortic aneurysm    Patient Measurements: Height: 5' 11 (180.3 cm) Weight: 75.2 kg (165 lb 12.6 oz) IBW/kg (Calculated) : 75.3 HEPARIN  DW (KG): 76.2  Vital Signs: Temp: 99 F (37.2 C) (08/31 0730) Temp Source: Oral (08/31 0730) BP: 129/44 (08/31 1300) Pulse Rate: 104 (08/31 1300)  Labs: Recent Labs    09/05/23 0312 09/05/23 1200 09/05/23 1200 09/06/23 0500 09/06/23 1008 09/06/23 1900 09/07/23 0500 09/07/23 0953  HGB  --  10.3*   < >  --  9.2*  --  8.0*  --   HCT  --  30.8*  --   --  27.7*  --  24.8*  --   PLT  --  172  --   --  161  --  162  --   APTT  --   --   --   --   --  62*  --   --   HEPARINUNFRC  --   --   --   --   --  <0.10*  --  0.24*  CREATININE 1.65*  --   --  1.31*  --   --  1.26*  --    < > = values in this interval not displayed.    Estimated Creatinine Clearance: 52.2 mL/min (A) (by C-G formula based on SCr of 1.26 mg/dL (H)).   Medical History: Past Medical History:  Diagnosis Date   Aneurysm of aortic root    Overview:  Last Assessment & Plan:  Planning to see Dr Army for evaluation of 5cm aneurysm.  - will check PFT in prep for possible procedure / SGY   Aortic atherosclerosis (HCC) 06/14/2022   Aortic regurgitation    Aortic valve insufficiency    Ascending aortic aneurysm (HCC) 09/11/2016   Atherosclerotic vascular disease 05/27/2022   Atrial fibrillation (HCC)    Atrial fibrillation with RVR (HCC) 07/05/2022   BMI 27.0-27.9,adult 06/14/2022   BPH (benign prostatic hyperplasia)    BRBPR (bright red blood per rectum) 05/20/2023   Cardiomyopathy (HCC) 09/11/2016   CHF (congestive heart failure) (HCC)    CKD (chronic kidney disease) stage 2, GFR 60-89 ml/min 11/23/2021   Closed fracture of  right hip (HCC) 03/10/2023   Coronary artery calcification seen on CT scan 10/13/2017   Dysrhythmia    Essential hypertension 06/14/2022   Gastrointestinal hemorrhage 05/20/2023   Heart murmur    High risk medication use 07/31/2022   History of cardiomyopathy 11/09/2021   History of pulmonary embolism 09/24/2012   Overview:  Last Assessment & Plan:  Hx PE x 2 per notes, ? 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  - needs auto-titration study by [employer] or local company, data to RB to adjust  his home device.    OSA (obstructive sleep apnea) 09/13/2022   Prediabetes    Primary osteoarthritis involving multiple joints 06/14/2022   Prostate cancer metastatic to bone (HCC) 06/14/2022   Pulmonary embolism (HCC)    Rhinitis    Sacral wound 04/11/2023   Seasonal allergies 06/14/2022   Spondylolisthesis of lumbar region 12/12/2021   Stage 3a chronic kidney disease (HCC) 06/14/2022   Unstageable pressure ulcer of sacral region (HCC) 03/10/2023    Medications:  Scheduled:   amiodarone   200 mg Oral BID   bisacodyl   10 mg Rectal Q0600   Chlorhexidine  Gluconate Cloth  6 each Topical Daily   cyanocobalamin   5,000 mcg Oral Q1200   methocarbamol   500 mg Oral QID   metoprolol  succinate  12.5 mg Oral BID   multivitamin with minerals  1 tablet Oral Daily   pantoprazole  (PROTONIX ) IV  40 mg Intravenous QHS   polyethylene glycol  17  g Oral BID   potassium chloride  SA  20 mEq Oral Daily   rosuvastatin   10 mg Oral Daily   sodium chloride  flush  10-40 mL Intracatheter Q12H   sodium chloride  flush  3 mL Intravenous Q12H   spironolactone   12.5 mg Oral Daily   tamsulosin   0.4 mg Oral Daily   Infusions:   heparin  1,100 Units/hr (09/07/23 0600)    Assessment: 78 yo male who presented for decompression of lumbar burst fracture. PMH significant for permanent Afib (on Xarelto , LD 8/23), thoracic aortic aneurysm, CAD. Subsequently went into NSVT and failed cardioversion and adenosine. Now cleared by EP. Pharmacy consulted for heparin  management.  Heparin  level 0.24 is subtherapeutic with heparin  running at 1100 units/hr. Hgb (8.0) and PLTs (162) are low stable. No signs of bleeding at bedside.  Goal of Therapy:  Heparin  level 0.3-0.5 units/ml Monitor platelets by anticoagulation protocol: Yes   Plan:  Increase heparin  infusion to 1150 units/hr Check 8 hour heparin  level Monitor daily heparin  level, CBC, and signs/symptoms of bleeding F/u neurosurgery plan to transition to PTA DOAC (likely 9/3 per note)   Thank you for allowing pharmacy to be a part of this patient's care.   Nidia Schaffer, PharmD PGY2 Cardiology Pharmacy Resident  Please check AMION for all [facility] Pharmacy phone numbers After 10:00 PM, call Main Pharmacy [phone] 09/07/2023,1:47 PM

## 2023-09-07 NOTE — Plan of Care (Signed)

## 2023-09-08 DIAGNOSIS — R570 Cardiogenic shock: Secondary | ICD-10-CM | POA: Diagnosis not present

## 2023-09-08 DIAGNOSIS — I5022 Chronic systolic (congestive) heart failure: Secondary | ICD-10-CM | POA: Diagnosis not present

## 2023-09-08 DIAGNOSIS — I480 Paroxysmal atrial fibrillation: Secondary | ICD-10-CM

## 2023-09-08 DIAGNOSIS — I2510 Atherosclerotic heart disease of native coronary artery without angina pectoris: Secondary | ICD-10-CM

## 2023-09-08 DIAGNOSIS — S32001S Stable burst fracture of unspecified lumbar vertebra, sequela: Secondary | ICD-10-CM

## 2023-09-08 DIAGNOSIS — I10 Essential (primary) hypertension: Secondary | ICD-10-CM

## 2023-09-08 LAB — CBC
HCT: 25 % — ABNORMAL LOW (ref 39.0–52.0)
Hemoglobin: 8.1 g/dL — ABNORMAL LOW (ref 13.0–17.0)
MCH: 31.2 pg (ref 26.0–34.0)
MCHC: 32.4 g/dL (ref 30.0–36.0)
MCV: 96.2 fL (ref 80.0–100.0)
Platelets: 190 K/uL (ref 150–400)
RBC: 2.6 MIL/uL — ABNORMAL LOW (ref 4.22–5.81)
RDW: 15.4 % (ref 11.5–15.5)
WBC: 9.4 K/uL (ref 4.0–10.5)
nRBC: 0 % (ref 0.0–0.2)

## 2023-09-08 LAB — COOXEMETRY PANEL
Carboxyhemoglobin: 2.1 % — ABNORMAL HIGH (ref 0.5–1.5)
Methemoglobin: <0.7 % (ref 0.0–1.5)
O2 Saturation: 68.7 %
Total hemoglobin: 8.6 g/dL — ABNORMAL LOW (ref 12.0–16.0)

## 2023-09-08 LAB — PHOSPHORUS: Phosphorus: 2.2 mg/dL — ABNORMAL LOW (ref 2.5–4.6)

## 2023-09-08 LAB — BASIC METABOLIC PANEL WITH GFR
Anion gap: 9 (ref 5–15)
BUN: 16 mg/dL (ref 8–23)
CO2: 28 mmol/L (ref 22–32)
Calcium: 8.7 mg/dL — ABNORMAL LOW (ref 8.9–10.3)
Chloride: 101 mmol/L (ref 98–111)
Creatinine, Ser: 1.12 mg/dL (ref 0.61–1.24)
GFR, Estimated: >60 mL/min (ref >60)
Glucose, Bld: 104 mg/dL — ABNORMAL HIGH (ref 70–99)
Potassium: 4.3 mmol/L (ref 3.5–5.1)
Sodium: 138 mmol/L (ref 135–145)

## 2023-09-08 LAB — MAGNESIUM: Magnesium: 2.2 mg/dL (ref 1.7–2.4)

## 2023-09-08 LAB — HEPARIN LEVEL (UNFRACTIONATED)
Heparin Unfractionated: 0.29 [IU]/mL — ABNORMAL LOW (ref 0.30–0.70)
Heparin Unfractionated: 0.17 [IU]/mL — ABNORMAL LOW (ref 0.30–0.70)

## 2023-09-08 NOTE — Progress Notes (Signed)
 PHARMACY - ANTICOAGULATION CONSULT NOTE  Pharmacy Consult for heparin  infusion Indication: atrial fibrillation  Allergies  Allergen Reactions   Cipro [Ciprofloxacin Hcl] Other (See Comments)    Increased risk of rupture  of ascending thoracic aortic aneurysm    Patient Measurements: Height: 5' 11 (180.3 cm) Weight: 75.2 kg (165 lb 12.6 oz) IBW/kg (Calculated) : 75.3 HEPARIN  DW (KG): 76.2  Vital Signs: Temp: 98.2 F (36.8 C) (09/01 1930) Temp Source: Oral (09/01 1930) BP: 112/49 (09/01 1930) Pulse Rate: 86 (09/01 1800)  Labs: Recent Labs    09/06/23 0500 09/06/23 1008 09/06/23 1008 09/06/23 1900 09/07/23 0500 09/07/23 0953 09/07/23 2200 09/08/23 0500 09/08/23 0700 09/08/23 1830  HGB  --  9.2*   < >  --  8.0*  --   --  8.1*  --   --   HCT  --  27.7*  --   --  24.8*  --   --  25.0*  --   --   PLT  --  161  --   --  162  --   --  190  --   --   APTT  --   --   --  62*  --   --   --   --   --   --   HEPARINUNFRC  --   --   --  <0.10*  --    < > 0.15*  --  0.29* 0.17*  CREATININE 1.31*  --   --   --  1.26*  --   --  1.12  --   --    < > = values in this interval not displayed.    Estimated Creatinine Clearance: 58.8 mL/min (by C-G formula based on SCr of 1.12 mg/dL).   Assessment: 78 yo male who presented for decompression of lumbar burst fracture. PMH significant for permanent Afib (on Xarelto , LD 8/23), thoracic aortic aneurysm, CAD. Subsequently went into NSVT and failed cardioversion and adenosine. Now cleared by EP. Pharmacy consulted for heparin  management.  Heparin  level 0.17 is subtherapeutic with heparin  running at 1350 units/hr. Hgb (8.1) and PLTs (190) are stable. Per RN, no report of pauses, issues with the line, or signs of bleeding. Will slightly increase rate to reach therapeutic goal.   Goal of Therapy:  Heparin  level 0.3-0.5 units/ml Monitor platelets by anticoagulation protocol: Yes   Plan:  Increase heparin  infusion to 1450 units/hr Check 8  hour heparin  level Monitor daily heparin  level, CBC, and sign/symptoms of bleeding F/u neurosurgery plan to transition to PTA DOAC (likely 9/3 per note)  Thank you for allowing pharmacy to be a part of this patient's care.   Harlene Barlow, [employer], BCCP Clinical Pharmacist  09/08/2023 7:56 PM   [facility] pharmacy phone numbers are listed on [email or web address]

## 2023-09-08 NOTE — Progress Notes (Signed)
 Inpatient Rehab Admissions Coordinator:    CIR following, Will send case to insurance once PT/OT has seen Pt.   Leita Kleine, MS, CCC-SLP Rehab Admissions Coordinator  [phone] (celll) [phone] (office)

## 2023-09-08 NOTE — Progress Notes (Signed)
 Physical Therapy Treatment Patient Details Name: Aaron Richmond MRN: [id number] DOB: Jun 20, 1945 Today's Date: 09/08/2023   History of Present Illness Pt is a 78 y/o male admitted 09/03/23 for planned transpedicular decompression of L1 instrumented fusion from T10-L4. During Sx Pt noted to be in a narrow complex tachycardia and hypotension requiring pressor support and cardioversion x2 and one shock. PMH includes Aneurysm of aortic root, Aortic atherosclerosis, Aortic regurgitation, Aortic valve insufficiency, Ascending aortic aneurysm, Atherosclerotic vascular disease, Atrial fibrillation, CHF, Closed fracture of right hip, Dysrhythmia, HTN, Gastrointestinal hemorrhage, Heart murmur, History of cardiomyopathy, PE, Lumbar radiculopathy, OSA, Prostate cancer metastatic to bone, Rhinitis    PT Comments  Progressing towards functional goals. Able to ambulate short distances in room today, forward and backwards with min assist and RW to stabilize. Reviewed precautions with movement, including log roll for bed mobility which needs reinforcement. Complains of Rt heel pain. Instructed not to wear AFO in bed (only when walking.) heels left floating. Patient will benefit from intensive inpatient follow-up therapy, >3 hours/day. Patient will continue to benefit from skilled physical therapy services to further improve independence with functional mobility.   BP supine 102/42 (MAP 60),sitting 106/64 (78) standing112/42 (65) ; Upon returning to bed end of session BP 117/44 (67) All taken while wearing compression stockings.     If plan is discharge home, recommend the following: A lot of help with walking and/or transfers;A lot of help with bathing/dressing/bathroom;Assistance with cooking/housework;Assist for transportation;Help with stairs or ramp for entrance   Can travel by private vehicle        Equipment Recommendations  None recommended by PT    Recommendations for Other Services Rehab consult      Precautions / Restrictions Precautions Precautions: Back Precaution Booklet Issued: No Recall of Precautions/Restrictions: Intact Precaution/Restrictions Comments: able to state 3/3 precautions Required Braces or Orthoses: Spinal Brace;Other Brace Spinal Brace: Lumbar corset Other Brace: AFO RLE Restrictions Weight Bearing Restrictions Per Provider Order: Yes     Mobility  Bed Mobility Overal bed mobility: Needs Assistance Bed Mobility: Rolling, Supine to Sit, Sit to Supine Rolling: Mod assist Sidelying to sit: +2 for physical assistance, Mod assist, Used rails   Sit to supine: Mod assist, +2 for safety/equipment Sit to sidelying: Mod assist, Used rails General bed mobility comments: pt requires 1 step cues to sequence bed mobility with reinforcement of back alignment. pt sitting eob with report of 1 out 10 dizziness. pt returning to supine requires BLE lifted to bed surface and second therapist ensuring no twisting with R UE reaching for rail. Cues for technique throughout.    Transfers Overall transfer level: Needs assistance Equipment used: Rolling walker (2 wheels) Transfers: Sit to/from Stand Sit to Stand: +2 physical assistance, Mod assist           General transfer comment: pt required the bed elevated. mod +2 for boost with vc for hand placement each time. Pt need repetition for reinforcement of safety with RW.  ( R UE up on RW and L UE on bed with fist was his preferred position) performed several times. See BP in general comments.    Ambulation/Gait Ambulation/Gait assistance: Min assist, +2 safety/equipment Gait Distance (Feet): 4 Feet (x3) Assistive device: Rolling walker (2 wheels) Gait Pattern/deviations: Step-through pattern, Decreased stride length, Decreased dorsiflexion - right, Shuffle, Trunk flexed Gait velocity: decreased Gait velocity interpretation: <1.31 ft/sec, indicative of household ambulator   General Gait Details: Cues for upright posture  with forward gaze, keeps head flexed most of  the tiem despite cues. Cues for increased foot clearance, AFO on but still dragging Rt toes at times. Min assist for balance and RW control initially but progressed to CGA. Forward and backwards close to bed due to BP. 4 feet forward and back x3 laps.   Stairs             Wheelchair Mobility     Tilt Bed    Modified Rankin (Stroke Patients Only)       Balance Overall balance assessment: Needs assistance Sitting-balance support: Bilateral upper extremity supported, Feet supported Sitting balance-Leahy Scale: Poor Sitting balance - Comments: EOB   Standing balance support: Bilateral upper extremity supported, During functional activity, Reliant on assistive device for balance Standing balance-Leahy Scale: Poor                              Communication Communication Communication: Impaired Factors Affecting Communication: Hearing impaired  Cognition Arousal: Alert Behavior During Therapy: WFL for tasks assessed/performed   PT - Cognitive impairments: No apparent impairments                         Following commands: Intact      Cueing Cueing Techniques: Verbal cues  Exercises      General Comments General comments (skin integrity, edema, etc.): BP supine 102/42 (MAP 60),sitting 106/64 (78) standing112/42 (65) ; Upon returning to bed end of session BP 117/44 (67) All taken while wearing compression stockings.      Pertinent Vitals/Pain Pain Assessment Pain Assessment: Faces Faces Pain Scale: Hurts little more Pain Location: back, Rt heel Pain Descriptors / Indicators: Aching, Operative site guarding Pain Intervention(s): Monitored during session, Repositioned, Limited activity within patient's tolerance    Home Living                          Prior Function            PT Goals (current goals can now be found in the care plan section) Acute Rehab PT Goals Patient Stated Goal:  Get well, return home PT Goal Formulation: With patient Time For Goal Achievement: 09/18/23 Potential to Achieve Goals: Good Progress towards PT goals: Progressing toward goals    Frequency    Min 3X/week      PT Plan      Co-evaluation PT/OT/SLP Co-Evaluation/Treatment: Yes Reason for Co-Treatment: For patient/therapist safety;To address functional/ADL transfers PT goals addressed during session: Mobility/safety with mobility;Balance;Proper use of DME;Strengthening/ROM OT goals addressed during session: ADL's and self-care;Strengthening/ROM      AM-PAC PT 6 Clicks Mobility   Outcome Measure  Help needed turning from your back to your side while in a flat bed without using bedrails?: A Little Help needed moving from lying on your back to sitting on the side of a flat bed without using bedrails?: A Little Help needed moving to and from a bed to a chair (including a wheelchair)?: A Lot Help needed standing up from a chair using your arms (e.g., wheelchair or bedside chair)?: A Lot Help needed to walk in hospital room?: A Lot Help needed climbing 3-5 steps with a railing? : Total 6 Click Score: 13    End of Session Equipment Utilized During Treatment: Gait belt;Back brace Activity Tolerance: Patient tolerated treatment well Patient left: with call bell/phone within reach;with family/visitor present;in bed;with bed alarm set Nurse Communication: Mobility status;Precautions PT Visit Diagnosis:  Unsteadiness on feet (R26.81);Other abnormalities of gait and mobility (R26.89);Muscle weakness (generalized) (M62.81);History of falling (Z91.81);Difficulty in walking, not elsewhere classified (R26.2);Other symptoms and signs involving the nervous system (R29.898);Pain Pain - part of body:  (back, Rt heel)     Time: 8971-8884 PT Time Calculation (min) (ACUTE ONLY): 47 min  Charges:    $Gait Training: 8-22 mins $Therapeutic Activity: 8-22 mins PT General Charges $$ ACUTE PT  VISIT: 1 Visit                     Leontine Roads, PT, DPT [facility]  Rehabilitation Services Physical Therapist Office: [phone] Website: [email or web address]    Leontine GORMAN Roads 09/08/2023, 12:05 PM

## 2023-09-08 NOTE — Progress Notes (Signed)
 Occupational Therapy Treatment Patient Details Name: Aaron Richmond MRN: [id number] DOB: 12-27-1945 Today's Date: 09/08/2023   History of present illness Pt is a 78 y/o male admitted 09/03/23 for planned transpedicular decompression of L1 instrumented fusion from T10-L4. During Sx Pt noted to be in a narrow complex tachycardia and hypotension requiring pressor support and cardioversion x2 and one shock. PMH includes Aneurysm of aortic root, Aortic atherosclerosis, Aortic regurgitation, Aortic valve insufficiency, Ascending aortic aneurysm, Atherosclerotic vascular disease, Atrial fibrillation, CHF, Closed fracture of right hip, Dysrhythmia, HTN, Gastrointestinal hemorrhage, Heart murmur, History of cardiomyopathy, PE, Lumbar radiculopathy, OSA, Prostate cancer metastatic to bone, Rhinitis   OT comments  Pt demonstrates incontinence for stool and skin break down on buttock. Pt rotated onto R side at the end of session for pressure relief. Pt also noted to have redness on R heel from wearing afo in bed supine. A pad was placed on heel for pressure relief and education to doff afo supine provided. Pt benefits from heel float and recommend a pravalon boot. Pt completed sit<>stand x3 during session with transfer with RW CGA once on feet. Pt can progress to the chair with RN (A). Recommend geo mat in chair for pressure relief. Recommendation for Patient will benefit from intensive inpatient follow-up therapy, >3 hours/day       If plan is discharge home, recommend the following:  Two people to help with walking and/or transfers;A lot of help with bathing/dressing/bathroom;Assistance with cooking/housework;Assist for transportation;Help with stairs or ramp for entrance   Equipment Recommendations  Other (comment) (defer to next venue)    Recommendations for Other Services Rehab consult;PT consult    Precautions / Restrictions Precautions Precautions: Back Recall of Precautions/Restrictions:  Intact Precaution/Restrictions Comments: able to state 3/3 precautions Required Braces or Orthoses: Spinal Brace;Other Brace Spinal Brace: Lumbar corset Other Brace: AFO RLE       Mobility Bed Mobility Overal bed mobility: Needs Assistance Bed Mobility: Rolling, Supine to Sit, Sit to Supine Rolling: Mod assist Sidelying to sit: +2 for physical assistance, Mod assist   Sit to supine: Mod assist   General bed mobility comments: pt requires 1 step cues to sequence bed mobility with reinforcement of back alignment. pt sitting eob with report of 1 out 10 dizziness. pt returning to supine requires BLE lifted to bed surface and second therapist ensuring no twisting with R UE reaching for rail    Transfers Overall transfer level: Needs assistance Equipment used: Standard walker Transfers: Sit to/from Stand Sit to Stand: +2 physical assistance, Mod assist           General transfer comment: pt required the bed elevated. mod vc for hand placement each time. Pt need repetition for reinforcement of safety with RW.  ( R UE up on RW and L UE on bed with fist was his preferred position)     Balance Overall balance assessment: Needs assistance Sitting-balance support: Bilateral upper extremity supported, Feet supported Sitting balance-Leahy Scale: Fair     Standing balance support: Bilateral upper extremity supported, During functional activity, Reliant on assistive device for balance Standing balance-Leahy Scale: Poor                             ADL either performed or assessed with clinical judgement   ADL Overall ADL's : Needs assistance/impaired  Toilet Transfer: +2 for physical assistance;Moderate assistance (sit <> stand at eob with need to return to sitting for bed pan. pt voiding and having sensation of void starting)   Toileting- Clothing Manipulation and Hygiene: Total assistance Toileting - Clothing Manipulation Details  (indicate cue type and reason): diarrhea       General ADL Comments: pt completed bed mobility, eob sitting with knee extension and sit<> stand x3. pt with incontinence of stool initially. pt noted to have skin break down    Extremity/Trunk Assessment Upper Extremity Assessment Upper Extremity Assessment: Generalized weakness   Lower Extremity Assessment Lower Extremity Assessment: Defer to PT evaluation        Vision       Perception     Praxis     Communication Communication Communication: Impaired Factors Affecting Communication: Hearing impaired   Cognition Arousal: Alert Behavior During Therapy: WFL for tasks assessed/performed Cognition: Cognition impaired             OT - Cognition Comments: pt asking answer to be repeated. pt reporting any and all tactile input sensations during session.                 Following commands: Intact        Cueing   Cueing Techniques: Verbal cues  Exercises Exercises: General Lower Extremity General Exercises - Lower Extremity Quad Sets: Both, 5 reps, Seated    Shoulder Instructions       General Comments BP supine 102/42 (60),sitting 106/64 (78) standing117/44 (67)  pt with AFO and ted hose on with tennis shoes    Pertinent Vitals/ Pain       Pain Assessment Pain Assessment: Faces Faces Pain Scale: Hurts little more Pain Location: back Pain Descriptors / Indicators: Aching, Operative site guarding Pain Intervention(s): Monitored during session, Repositioned, Premedicated before session, Limited activity within patient's tolerance  Home Living                                          Prior Functioning/Environment              Frequency  Min 2X/week        Progress Toward Goals  OT Goals(current goals can now be found in the care plan section)  Progress towards OT goals: Progressing toward goals  Acute Rehab OT Goals Patient Stated Goal: to have a good report from yall  for my wife OT Goal Formulation: With patient Time For Goal Achievement: 09/18/23 Potential to Achieve Goals: Good ADL Goals Pt Will Perform Grooming: sitting;with modified independence Pt Will Perform Upper Body Dressing: with supervision;sitting Pt Will Perform Lower Body Dressing: with min assist;sit to/from stand;with adaptive equipment;with caregiver independent in assisting Pt Will Transfer to Toilet: with contact guard assist;ambulating Pt Will Perform Toileting - Clothing Manipulation and hygiene: with contact guard assist;sitting/lateral leans;with caregiver independent in assisting Additional ADL Goal #1: Pt will verbalize and maintain back precautions during ADL routine with no cues  Plan      Co-evaluation    PT/OT/SLP Co-Evaluation/Treatment: Yes Reason for Co-Treatment: Necessary to address cognition/behavior during functional activity;For patient/therapist safety;To address functional/ADL transfers   OT goals addressed during session: ADL's and self-care;Strengthening/ROM      AM-PAC OT 6 Clicks Daily Activity     Outcome Measure   Help from another person eating meals?: None Help from another person taking care of personal grooming?:  A Little Help from another person toileting, which includes using toliet, bedpan, or urinal?: A Lot Help from another person bathing (including washing, rinsing, drying)?: A Lot Help from another person to put on and taking off regular upper body clothing?: A Lot Help from another person to put on and taking off regular lower body clothing?: A Lot 6 Click Score: 15    End of Session Equipment Utilized During Treatment: Gait belt;Rolling walker (2 wheels)  OT Visit Diagnosis: Unsteadiness on feet (R26.81);Other abnormalities of gait and mobility (R26.89);Muscle weakness (generalized) (M62.81);History of falling (Z91.81);Other symptoms and signs involving the nervous system (R29.898);Pain   Activity Tolerance Patient tolerated  treatment well   Patient Left in bed;with call bell/phone within reach;with bed alarm set   Nurse Communication Mobility status;Precautions        Time: 8970-8885 OT Time Calculation (min): 45 min  Charges: OT General Charges $OT Visit: 1 Visit OT Treatments $Self Care/Home Management : 23-37 mins   Brynn, OTR/L  Acute Rehabilitation Services Office: [phone] .   Ely Molt 09/08/2023, 11:29 AM

## 2023-09-08 NOTE — Plan of Care (Signed)

## 2023-09-08 NOTE — Progress Notes (Addendum)
 Patient ID: Aaron Richmond, male   DOB: 05-14-1945, 78 y.o.   MRN: [id number]     Advanced Heart Failure Rounding Note  Cardiologist: None   Chief Complaint: CHF  Subjective:    SBP 90s-100s. Wide pulse pressure w/ systolic 30s-40s. Remains off NE.  Remains in AF with controlled rate.   No shortness of breath. Able to take a few steps with PT. Gets a little lightheaded with position changes.   Objective:   Weight Range: 75.2 kg Body mass index is 23.12 kg/m.   Vital Signs:   Temp:  [98.6 F (37 C)] 98.6 F (37 C) (09/01 0700) Pulse Rate:  [77-116] 80 (09/01 0400) Resp:  [17-26] 19 (09/01 0400) BP: (92-129)/(40-56) 110/48 (09/01 0400) SpO2:  [85 %-97 %] 94 % (09/01 0400) Last BM Date : 09/07/23  Weight change: Filed Weights   09/03/23 0601 09/04/23 0555 09/06/23 0652  Weight: 76.2 kg 78 kg 75.2 kg    Intake/Output:   Intake/Output Summary (Last 24 hours) at 09/08/2023 0955 Last data filed at 09/08/2023 0600 Gross per 24 hour  Intake 493.38 ml  Output 1100 ml  Net -606.62 ml      Physical Exam    General:  Well appearing. Sitting up in bed. Cor: Irregular rhythm. 2/6 diastolic murmur. Lungs: clear Abdomen: soft, nontender, nondistended.  Extremities: no edema Neuro: alert & orientedx3. Affect pleasant   Telemetry   Afib with controlled rate, 70s  Labs    CBC Recent Labs    09/07/23 0500 09/08/23 0500  WBC 9.2 9.4  HGB 8.0* 8.1*  HCT 24.8* 25.0*  MCV 95.0 96.2  PLT 162 190   Basic Metabolic Panel Recent Labs    91/68/74 0500 09/08/23 0500  NA 136 138  K 3.5 4.3  CL 100 101  CO2 29 28  GLUCOSE 95 104*  BUN 21 16  CREATININE 1.26* 1.12  CALCIUM  8.7* 8.7*  MG 2.1 2.2  PHOS 2.4* 2.2*   Liver Function Tests No results for input(s): AST, ALT, ALKPHOS, BILITOT, PROT, ALBUMIN  in the last 72 hours. No results for input(s): LIPASE, AMYLASE in the last 72 hours. Cardiac Enzymes No results for input(s): CKTOTAL, CKMB,  CKMBINDEX, TROPONINI in the last 72 hours.  BNP: BNP (last 3 results) No results for input(s): BNP in the last 8760 hours.  ProBNP (last 3 results) Recent Labs    11/08/22 1623  PROBNP 1,382*     D-Dimer No results for input(s): DDIMER in the last 72 hours. Hemoglobin A1C No results for input(s): HGBA1C in the last 72 hours. Fasting Lipid Panel No results for input(s): CHOL, HDL, LDLCALC, TRIG, CHOLHDL, LDLDIRECT in the last 72 hours. Thyroid  Function Tests No results for input(s): TSH, T4TOTAL, T3FREE, THYROIDAB in the last 72 hours.  Invalid input(s): FREET3  Other results:   Imaging    No results found.    Medications:     Scheduled Medications:  amiodarone   200 mg Oral BID   bisacodyl   10 mg Rectal Daily   Chlorhexidine  Gluconate Cloth  6 each Topical Daily   cyanocobalamin   5,000 mcg Oral Q1200   methocarbamol   500 mg Oral QID   metoprolol  succinate  12.5 mg Oral BID   multivitamin with minerals  1 tablet Oral Daily   pantoprazole  (PROTONIX ) IV  40 mg Intravenous QHS   polyethylene glycol  17 g Oral BID   potassium chloride  SA  20 mEq Oral Daily   rosuvastatin   10 mg Oral Daily  sodium chloride  flush  10-40 mL Intracatheter Q12H   sodium chloride  flush  3 mL Intravenous Q12H   spironolactone   12.5 mg Oral Daily   tamsulosin   0.4 mg Oral Daily    Infusions:  heparin  1,300 Units/hr (09/08/23 0656)    PRN Medications: acetaminophen  **OR** acetaminophen , alum & mag hydroxide-simeth, HYDROcodone -acetaminophen , HYDROmorphone  (DILAUDID ) injection, menthol -cetylpyridinium **OR** phenol, ondansetron  **OR** ondansetron  (ZOFRAN ) IV, mouth rinse, polyethylene glycol, sodium chloride  flush, sodium chloride  flush, traMADol    Assessment/Plan   1. Shock: Patient was on NE x 3 days post-op.  He has a wide pulse pressure likely due to his moderate AI.  BP is very stable off NE, SBP 110s-120s.  Echo is stable compared to prior, EF  30-35%, mild RV dysfunction, ascending aorta 56 mm with moderate AI.  Prior echo similar with EF 35-40%, moderate AI. Excellent co-ox 79%.  - He can stay off NE, would aim for systolic BP >/= 100 rather than using MAP.   2. Chronic systolic CHF: Nonischemic CMP.  Echo showed EF 30-35%, mild RV dysfunction, ascending aorta 56 mm with moderate AI. Nonischemic cardiomyopathy, coronary CTA in 7/25 showed extensive but nonobstructive CAD.  Cardiomyopathy may be due to permanent AF, ?elevated HR at times.  - CO-OX stable 68% off NE. - Volume okay. Hold Torsemide  today - Would like to keep HR better-controlled given concern for tachy-mediated CMP.   Continue Toprol  XL 12.5 mg BID, HR 90s - Continue spironolactone  12.5 daily.  - GDMT limited by orthostasis - Would like him to eventually get a cardiac MRI, with back operation he does not think he could lie flat for a prolonged period of time in the MRI scanner at this point.   3. Atrial fibrillation: Permanent.  Did not cardiovert to NSR when shocked for SVT. Rate control okay.  - Continue Toprol  XL.   - Continue heparin  gtt (begun yesterday).  Restart Xarelto  when ok with neurosurgery.  4. SVT: Patient had rapid SVT post-op, required DCCV.  Now on amiodarone .  - Would continue amiodarone  200 mg bid for now.  5. CAD: Nonobstructive but extensive CAD on coronary CTA.  - Continue statin.  - No ASA with anticoagulation.  6. AKI: Creatinine up to 1.6 initially.  Now down to 1.12. 7. Dilated aortic root/ascending aorta with moderate AI: 56 mm ascending aorta on echo, this is roughly stable from last CTA.   - Close followup with TCTS as outpatient.  - Moderate AI is likely causing the wide pulse pressure.  8. S/p decompression L1, fusion T10-L4: Per neurosurgery.  Needs PT.  - Graded compression stockings to help with orthostasis.   Has transfer orders to progressive unit. Advanced Heart Failure will continue to follow out of ICU.  Length of Stay:  5  [facility], LINDSAY N, PA-C  09/08/2023, 9:55 AM  Advanced Heart Failure Team Pager [phone] (M-F; 7a - 5p)  Please contact [facility] for night-coverage after hours (5p -7a ) and weekends on [email or web address]  Patient seen and examined with the above-signed Advanced Practice Provider and/or Housestaff. I personally reviewed laboratory data, imaging studies and relevant notes. I independently examined the patient and formulated the important aspects of the plan. I have edited the note to reflect any of my changes or salient points. I have personally discussed the plan with the patient and/or family.  He feels okay this morning.  Continues with brief periods of orthostasis anytime he sits up or tries to stand.*Blood pressure remains in the 90 to low 100s  though wide pulse pressure.  Co. ox 68% off norepinephrine .  He is wearing compression hose.  No further SVT.  Pain is well-controlled.  Serum creatinine is stable.  General: Lying in bed. No resp difficulty HEENT: normal Neck: supple. no JVD. Carotids 2+ bilat; no bruits. No lymphadenopathy or thryomegaly appreciated. Cor: PMI nondisplaced.  Irregular 2/6 AI murmur Lungs: clear Abdomen: soft, nontender, nondistended. No hepatosplenomegaly. No bruits or masses. Good bowel sounds. Extremities: no cyanosis, clubbing, rash, loss of compression stockings.  Trace edema Neuro: alert & orientedx3, cranial nerves grossly intact. moves all 4 extremities w/o difficulty. Affect pleasant  He is now off pressors but hemodynamics remain a bit tenuous.  Systolic blood pressures in the 90-100 range in the setting of reduced EF and moderate aortic insufficiency.  Will hold diuretics.  Can add midodrine as needed but I would like to defer if possible.  Continue compression hose.  It is suspected that his cardiomyopathy is tachycardia mediated.  Will continue to work on control of A-fib rate.  Continue heparin  for now switch to DOAC on Tuesday per neurosurgery.  He is  suitable to transfer to the floor.  Toribio Fuel, MD  10:56 AM

## 2023-09-08 NOTE — Progress Notes (Signed)
 PHARMACY - ANTICOAGULATION CONSULT NOTE  Pharmacy Consult for heparin  infusion Indication: atrial fibrillation  Allergies  Allergen Reactions   Cipro [Ciprofloxacin Hcl] Other (See Comments)    Increased risk of rupture  of ascending thoracic aortic aneurysm    Patient Measurements: Height: 5' 11 (180.3 cm) Weight: 75.2 kg (165 lb 12.6 oz) IBW/kg (Calculated) : 75.3 HEPARIN  DW (KG): 76.2  Vital Signs: Temp: 98.6 F (37 C) (09/01 0700) Temp Source: Oral (09/01 0700) BP: 110/48 (09/01 0400) Pulse Rate: 80 (09/01 0400)  Labs: Recent Labs    09/06/23 0500 09/06/23 1008 09/06/23 1008 09/06/23 1900 09/07/23 0500 09/07/23 0953 09/07/23 2200 09/08/23 0500 09/08/23 0700  HGB  --  9.2*  --   --  8.0*  --   --  8.1*  --   HCT  --  27.7*  --   --  24.8*  --   --  25.0*  --   PLT  --  161  --   --  162  --   --  190  --   APTT  --   --   --  62*  --   --   --   --   --   HEPARINUNFRC  --   --    < > <0.10*  --  0.24* 0.15*  --  0.29*  CREATININE 1.31*  --   --   --  1.26*  --   --  1.12  --    < > = values in this interval not displayed.    Estimated Creatinine Clearance: 58.8 mL/min (by C-G formula based on SCr of 1.12 mg/dL).   Assessment: 78 yo male who presented for decompression of lumbar burst fracture. PMH significant for permanent Afib (on Xarelto , LD 8/23), thoracic aortic aneurysm, CAD. Subsequently went into NSVT and failed cardioversion and adenosine. Now cleared by EP. Pharmacy consulted for heparin  management.  Heparin  level 0.27 is subtherapeutic with heparin  running at 1300 units/hr. Hgb (8.1) and PLTs (190) are stable. Per RN, no report of pauses, issues with the line, or signs of bleeding. Will slightly increase rate to reach therapeutic goal.   Goal of Therapy:  Heparin  level 0.3-0.5 units/ml Monitor platelets by anticoagulation protocol: Yes   Plan:  Increase heparin  infusion to 1350 units/hr Check 8 hour heparin  level Monitor daily heparin  level,  CBC, and sign/symptoms of bleeding F/u neurosurgery plan to transition to PTA DOAC (likely 9/3 per note)  Thank you for allowing pharmacy to be a part of this patient's care.   Nidia Schaffer, PharmD PGY2 Cardiology Pharmacy Resident  Please check AMION for all [facility] phone numbers After 10:00 PM, call Main Pharmacy [phone] 09/08/2023,9:24 AM

## 2023-09-08 NOTE — Progress Notes (Signed)
 [facility]  PROGRESS NOTE  Aaron Richmond FMW:[id number] DOB: 1945/08/24 DOA: 09/03/2023 PCP: Fleeta Valeria Mayo, MD   Brief HPI:    78 yo M PMH Afib, HFrEF, thoracic aortic aneurysm, CAD admitted to NSGY service 8/27 for transpedicular decompression L1, instrumented fusion T10-L4 for severe L1 cord compression / burst fracture. Preceding sx RLE numbness/tingling+ back pain.     Post op significant for unstable SVT. Cards consulted. Failed cardioversion x2 at 200J, failed adenosine 6 & 12. Failed add'l 200J CV attempt, and converted to Afib RVR with 360J CV, with improvement in blood pressures    Assessment/Plan:   L1 burst fracture with cord compression - Status post thoracolumbar fusion T10-L4 - Neurosurgery following -Awaiting PT eval  Postop SVT(unstable), atrial fibrillation -Cardiology was consulted, failed cardioversion x 2 at 200 J; failed adenosine 6 mg and 12 mg -Failed distal 200 J cardioversion attempt, converted to A-fib with RVR with 360 gel -Started on heparin  GTT, okay with neurosurgery -Continue amiodarone , metoprolol  -Neurosurgery recommends to hold off on starting nonreversible medication such as Eliquis Xarelto  -Will follow neurosurgery recommendation  Chronic systolic CHF -Nonischemic cardiomyopathy, echo showed EF of 30 to 35%, with mild RV dysfunction -Nonischemic cardiomyopathy, coronary CTA on 7/25 showed extensive but nonobstructive CAD -Continue Toprol  XL, Aldactone  -Heart failure team following -GDMT limited due to orthostasis   Medications     amiodarone   200 mg Oral BID   bisacodyl   10 mg Rectal Daily   Chlorhexidine  Gluconate Cloth  6 each Topical Daily   cyanocobalamin   5,000 mcg Oral Q1200   methocarbamol   500 mg Oral QID   metoprolol  succinate  12.5 mg Oral BID   multivitamin with minerals  1 tablet Oral Daily   pantoprazole  (PROTONIX ) IV  40 mg Intravenous QHS   polyethylene glycol  17 g Oral BID   potassium chloride  SA  20 mEq  Oral Daily   rosuvastatin   10 mg Oral Daily   sodium chloride  flush  10-40 mL Intracatheter Q12H   sodium chloride  flush  3 mL Intravenous Q12H   spironolactone   12.5 mg Oral Daily   tamsulosin   0.4 mg Oral Daily     Data Reviewed:   CBG:  No results for input(s): GLUCAP in the last 168 hours.  SpO2: 94 % O2 Flow Rate (L/min): 2 L/min    Vitals:   09/08/23 0200 09/08/23 0300 09/08/23 0400 09/08/23 0700  BP: (!) 104/41 (!) 106/41 (!) 110/48   Pulse: 77 80 80   Resp: 19 17 19    Temp:    98.6 F (37 C)  TempSrc:    Oral  SpO2: 96% 97% 94%   Weight:      Height:          Data Reviewed:  Basic Metabolic Panel: Recent Labs  Lab 09/03/23 1440 09/05/23 0312 09/06/23 0500 09/06/23 0512 09/07/23 0500 09/08/23 0500  NA 137 134* 135  --  136 138  K 3.9 3.9 3.4*  --  3.5 4.3  CL 103 96* 95*  --  100 101  CO2 20* 26 28  --  29 28  GLUCOSE 179* 142* 108*  --  95 104*  BUN 20 22 22   --  21 16  CREATININE 1.23 1.65* 1.31*  --  1.26* 1.12  CALCIUM  8.7* 8.3* 8.6*  --  8.7* 8.7*  MG 1.8 2.4  --  2.2 2.1 2.2  PHOS 4.1  --   --   --  2.4* 2.2*  CBC: Recent Labs  Lab 09/03/23 1440 09/05/23 1200 09/06/23 1008 09/07/23 0500 09/08/23 0500  WBC  --  13.3* 10.2 9.2 9.4  HGB 11.2* 10.3* 9.2* 8.0* 8.1*  HCT 34.0* 30.8* 27.7* 24.8* 25.0*  MCV  --  93.1 93.6 95.0 96.2  PLT  --  172 161 162 190    LFT No results for input(s): AST, ALT, ALKPHOS, BILITOT, PROT, ALBUMIN  in the last 168 hours.   Antibiotics: Anti-infectives (From admission, onward)    Start     Dose/Rate Route Frequency Ordered Stop   09/03/23 1800  ceFAZolin  (ANCEF ) IVPB 2g/100 mL premix        2 g 200 mL/hr over 30 Minutes Intravenous Every 8 hours 09/03/23 1711 09/05/23 1105   09/03/23 0600  ceFAZolin  (ANCEF ) IVPB 2g/100 mL premix        2 g 200 mL/hr over 30 Minutes Intravenous On call to O.R. 09/03/23 0554 09/03/23 0754        DVT prophylaxis: Heparin   Code Status: Full  code  Family Communication: Discussed with patient's wife at bedside   CONSULTS cardiology, neurosurgery   Subjective   Complains of pain this morning   Objective    Physical Examination:  General-appears in no acute distress Heart-S1-S2, regular, no murmur auscultated Lungs-clear to auscultation bilaterally, no wheezing or crackles auscultated Abdomen-soft, nontender, no organomegaly Extremities-no edema in the lower extremities Neuro-alert, oriented x3, no focal deficit noted  Status is: Inpatient:             Aaron Richmond   [facility] If 7PM-7AM, please contact night-coverage at [email or web address], Office  [phone]   09/08/2023, 7:51 AM  LOS: 5 days

## 2023-09-08 NOTE — Progress Notes (Signed)
 Assessment 78 y/o M w/ complex cardiac hx who underwent T10-L4 decompression partial corpectomy and fusion on 8/27. Postoperatively required cardioversion and anticoagulation  LOS: 5 days    Plan: Can transfer to general floor from nsgy standpoint AAT DAT Ok to continue therapeutic heparin . Would hold off on starting non-reversible medication such as Eliquis/Xarelto  during the holiday weekend. Will discuss restart date on Tuesday. Anticipate POD7 (Wed)   Subjective: Worked with therapy and was able to take 20 steps which is a modest improvement  Objective: Vital signs in last 24 hours: Temp:  [98.6 F (37 C)] 98.6 F (37 C) (09/01 0700) Pulse Rate:  [73-116] 82 (09/01 1000) Resp:  [14-26] 14 (09/01 1000) BP: (92-129)/(35-56) 108/35 (09/01 1000) SpO2:  [85 %-97 %] 95 % (09/01 1000)  Intake/Output from previous day: 08/31 0701 - 09/01 0700 In: 613.4 [P.O.:360; I.V.:253.4] Out: 1100 [Urine:1100] Intake/Output this shift: No intake/output data recorded.  Exam: Incision covered in honeycomb dressing Awake, alert 5/5 b/l hand grip RLE: 5/5 IP, quad, ham, PF. 0/5 DF, EHL LLE: 5/5 IP, quad, ham. 3/5 DF, EHL. 5/5 PF  Lab Results: Recent Labs    09/07/23 0500 09/08/23 0500  WBC 9.2 9.4  HGB 8.0* 8.1*  HCT 24.8* 25.0*  PLT 162 190   BMET Recent Labs    09/07/23 0500 09/08/23 0500  NA 136 138  K 3.5 4.3  CL 100 101  CO2 29 28  GLUCOSE 95 104*  BUN 21 16  CREATININE 1.26* 1.12  CALCIUM  8.7* 8.7*    Studies/Results: No results found.     Aaron Richmond Glade 09/08/2023, 10:51 AM

## 2023-09-09 DIAGNOSIS — S32001S Stable burst fracture of unspecified lumbar vertebra, sequela: Secondary | ICD-10-CM | POA: Diagnosis not present

## 2023-09-09 DIAGNOSIS — I480 Paroxysmal atrial fibrillation: Secondary | ICD-10-CM | POA: Diagnosis not present

## 2023-09-09 DIAGNOSIS — I7121 Aneurysm of the ascending aorta, without rupture: Secondary | ICD-10-CM

## 2023-09-09 DIAGNOSIS — I5022 Chronic systolic (congestive) heart failure: Secondary | ICD-10-CM

## 2023-09-09 DIAGNOSIS — I4821 Permanent atrial fibrillation: Secondary | ICD-10-CM | POA: Diagnosis not present

## 2023-09-09 DIAGNOSIS — N182 Chronic kidney disease, stage 2 (mild): Secondary | ICD-10-CM

## 2023-09-09 LAB — COMPREHENSIVE METABOLIC PANEL WITH GFR
ALT: 10 U/L (ref 0–44)
AST: 29 U/L (ref 15–41)
Albumin: 2.6 g/dL — ABNORMAL LOW (ref 3.5–5.0)
Alkaline Phosphatase: 61 U/L (ref 38–126)
Anion gap: 9 (ref 5–15)
BUN: 13 mg/dL (ref 8–23)
CO2: 26 mmol/L (ref 22–32)
Calcium: 8.4 mg/dL — ABNORMAL LOW (ref 8.9–10.3)
Chloride: 102 mmol/L (ref 98–111)
Creatinine, Ser: 1 mg/dL (ref 0.61–1.24)
GFR, Estimated: >60 mL/min (ref >60)
Glucose, Bld: 96 mg/dL (ref 70–99)
Potassium: 4.3 mmol/L (ref 3.5–5.1)
Sodium: 137 mmol/L (ref 135–145)
Total Bilirubin: 0.6 mg/dL (ref 0.0–1.2)
Total Protein: 5.2 g/dL — ABNORMAL LOW (ref 6.5–8.1)

## 2023-09-09 LAB — CBC
HCT: 24.2 % — ABNORMAL LOW (ref 39.0–52.0)
Hemoglobin: 7.8 g/dL — ABNORMAL LOW (ref 13.0–17.0)
MCH: 30.6 pg (ref 26.0–34.0)
MCHC: 32.2 g/dL (ref 30.0–36.0)
MCV: 94.9 fL (ref 80.0–100.0)
Platelets: 192 K/uL (ref 150–400)
RBC: 2.55 MIL/uL — ABNORMAL LOW (ref 4.22–5.81)
RDW: 15.1 % (ref 11.5–15.5)
WBC: 8.2 K/uL (ref 4.0–10.5)
nRBC: 0 % (ref 0.0–0.2)

## 2023-09-09 LAB — HEPARIN LEVEL (UNFRACTIONATED)
Heparin Unfractionated: 0.33 [IU]/mL (ref 0.30–0.70)
Heparin Unfractionated: 0.33 [IU]/mL (ref 0.30–0.70)

## 2023-09-09 LAB — PHOSPHORUS: Phosphorus: 2.3 mg/dL — ABNORMAL LOW (ref 2.5–4.6)

## 2023-09-09 LAB — MAGNESIUM: Magnesium: 2.2 mg/dL (ref 1.7–2.4)

## 2023-09-09 MED ORDER — SPIRONOLACTONE 25 MG PO TABS
25.0000 mg | ORAL_TABLET | Freq: Every day | ORAL | Status: DC
  Administered 2023-09-09 – 2023-09-10 (×2): 25 mg via ORAL
  Filled 2023-09-09 (×2): qty 1

## 2023-09-09 MED ORDER — TORSEMIDE 20 MG PO TABS
20.0000 mg | ORAL_TABLET | Freq: Every day | ORAL | Status: DC
  Administered 2023-09-09 – 2023-09-10 (×2): 20 mg via ORAL
  Filled 2023-09-09 (×2): qty 1

## 2023-09-09 NOTE — Progress Notes (Signed)
 Physical Therapy Treatment Patient Details Name: Aaron Richmond MRN: [id number] DOB: 1945/08/20 Today's Date: 09/09/2023   History of Present Illness Pt is a 78 y/o male admitted 09/03/23 for planned transpedicular decompression of L1 instrumented fusion from T10-L4. During Sx pt noted to be in a narrow complex tachycardia and hypotension requiring pressor support and cardioversion x2 and one shock. PMH includes Aneurysm of aortic root, Aortic atherosclerosis, Aortic regurgitation, Aortic valve insufficiency, Ascending aortic aneurysm, Atherosclerotic vascular disease, Atrial fibrillation, CHF, Closed fracture of right hip, Dysrhythmia, HTN, Gastrointestinal hemorrhage, Heart murmur, History of cardiomyopathy, PE, Lumbar radiculopathy, OSA, Prostate cancer metastatic to bone, Rhinitis    PT Comments  Pt received in supine after finishing his dinner, pt now agreeable to work on transfer to EOB and sit<>stand with RW support. Pt appears anxious due to expressed bowel urgency and reports R heel pain while being assisted to don AFO and shoe. Pt has BLE compression socks in place when PTA arrived so did not visualize his R heel as pt anxious to get to [facility], pt had heels floated also for comfort at rest. Pt needing up to modA +2 for safety with transfer to EOB and pivotal steps to [facility] with RW and mod sequencing cues due to mildly decreased safety awareness as his bowel urgency increased. Pt benefits from 1-step multimodal cues when distracted. Pt denies lightheadedness or nausea once on [facility] and agreeable notify staff via call bell once done on BSC. Pt checked on after >10 mins on [facility] but pt requesting more time, RN/NT notified as PTA will not be able to wait longer to assist due to schedule, nursing staff agreeable to assist him with pivot from [facility] back to EOB once he is finished. Patient will benefit from intensive inpatient follow-up therapy, >3 hours/day.    If plan is discharge home, recommend the following:  A lot of help with bathing/dressing/bathroom;Assistance with cooking/housework;Assist for transportation;Help with stairs or ramp for entrance;Two people to help with walking and/or transfers   Can travel by private vehicle        Equipment Recommendations  None recommended by PT (TBD)    Recommendations for Other Services Rehab consult     Precautions / Restrictions Precautions Precautions: Back Precaution Booklet Issued: Yes (comment) Recall of Precautions/Restrictions: Intact Precaution/Restrictions Comments: able to state 3/3 precautions but not able to explain what no bending means and needs reminders to avoid twisting his back while pivoting quickly to [facility]. Required Braces or Orthoses: Spinal Brace;Other Brace Spinal Brace: Lumbar corset Other Brace: AFO RLE Restrictions Weight Bearing Restrictions Per Provider Order: No     Mobility  Bed Mobility Overal bed mobility: Needs Assistance Bed Mobility: Rolling, Sidelying to Sit Rolling: Min assist, Used rails, +2 for safety/equipment Sidelying to sit: +2 for physical assistance, Used rails, HOB elevated, Min assist       General bed mobility comments: Pt requires 1 step cues to sequence bed mobility with reinforcement of back alignment to avoid twisting. HOB ~45 deg prior to transfer which seemed to help with lifting. Bed rail also used PRN.    Transfers Overall transfer level: Needs assistance Equipment used: Rolling walker (2 wheels) Transfers: Sit to/from Stand Sit to Stand: Mod assist, +2 safety/equipment, From elevated surface   Step pivot transfers: Mod assist, +2 safety/equipment       General transfer comment: Bed elevated ~4 prior to STS trial. ModA +1 lift assist, +2 safety with NT standing by. Pt need repetition for reinforcement of safety with  RW as pt tending to let go of RW handle prior to reaching [facility] with bowel urgency expressed multiple times. Primofit stayed plugged in with urination through  primofit while pivoting to [facility]. Pt cued to push from bed with LUE as he prefers single UE on RW handle, pt also instructed on no bending past 90 deg before trial with good carryover. Cues to avoid twisting while pivoting needed and tactile/vcs where to place hands with stand>sit.    Ambulation/Gait Ambulation/Gait assistance: Min assist, Mod assist, +2 physical assistance Gait Distance (Feet): 3 Feet Assistive device: Rolling walker (2 wheels) Gait Pattern/deviations: Decreased stride length, Decreased dorsiflexion - right, Shuffle, Trunk flexed, Step-to pattern Gait velocity: decreased     General Gait Details: shuffled steps to [facility]; bowel urgency so not able to progress further distance.   Stairs             Wheelchair Mobility     Tilt Bed    Modified Rankin (Stroke Patients Only)       Balance Overall balance assessment: Needs assistance Sitting-balance support: Bilateral upper extremity supported, Feet supported Sitting balance-Leahy Scale: Poor Sitting balance - Comments: EOB; CGA to minA unsupported   Standing balance support: Bilateral upper extremity supported, During functional activity, Reliant on assistive device for balance Standing balance-Leahy Scale: Poor Standing balance comment: RW and external support                            Communication Communication Communication: Impaired Factors Affecting Communication: Hearing impaired  Cognition Arousal: Alert Behavior During Therapy: WFL for tasks assessed/performed   PT - Cognitive impairments: No apparent impairments, Sequencing                       PT - Cognition Comments: needs mod sequencing cues for UE placement/safety with transfers due to pt bowel urgency, tending to rush a bit. Following commands: Intact      Cueing Cueing Techniques: Verbal cues, Tactile cues, Gestural cues  Exercises      General Comments General comments (skin integrity, edema, etc.): VSS per  tele monitor, no acute s/sx distress with STS other than bowel urgency      Pertinent Vitals/Pain Pain Assessment Pain Assessment: Faces Faces Pain Scale: Hurts little more Pain Location: back, Rt heel Pain Descriptors / Indicators: Aching, Operative site guarding, Tender Pain Intervention(s): Limited activity within patient's tolerance, Monitored during session, Repositioned    Home Living                          Prior Function            PT Goals (current goals can now be found in the care plan section) Acute Rehab PT Goals Patient Stated Goal: Get well, return home PT Goal Formulation: With patient Time For Goal Achievement: 09/18/23 Progress towards PT goals: Progressing toward goals    Frequency    Min 3X/week      PT Plan      Co-evaluation              AM-PAC PT 6 Clicks Mobility   Outcome Measure  Help needed turning from your back to your side while in a flat bed without using bedrails?: A Little Help needed moving from lying on your back to sitting on the side of a flat bed without using bedrails?: A Lot (w/o rails) Help needed moving to  and from a bed to a chair (including a wheelchair)?: A Lot Help needed standing up from a chair using your arms (e.g., wheelchair or bedside chair)?: A Lot Help needed to walk in hospital room?: Total Help needed climbing 3-5 steps with a railing? : Total 6 Click Score: 11    End of Session Equipment Utilized During Treatment: Gait belt;Back brace;Other (comment) (RLE AFO) Activity Tolerance: Patient tolerated treatment well;Other (comment) (bowel urgency) Patient left: with call bell/phone within reach;Other (comment) (pt up on [facility], call bell in front of him; spouse waiting in waiting room for him to be done) Nurse Communication: Mobility status;Precautions;Other (comment) (RN/NT notified pt up on [facility] and plans to call via call bell when he is done) PT Visit Diagnosis: Unsteadiness on feet  (R26.81);Other abnormalities of gait and mobility (R26.89);Muscle weakness (generalized) (M62.81);History of falling (Z91.81);Difficulty in walking, not elsewhere classified (R26.2);Other symptoms and signs involving the nervous system (R29.898);Pain Pain - part of body:  (back, Rt heel)     Time: 8340-8284 PT Time Calculation (min) (ACUTE ONLY): 16 min  Charges:    $Therapeutic Activity: 8-22 mins PT General Charges $$ ACUTE PT VISIT: 1 Visit                     Shakir Petrosino P., PTA Acute Rehabilitation Services Secure Chat Preferred 9a-5:30pm Office: [phone]    Connell HERO [facility] 09/09/2023, 5:57 PM

## 2023-09-09 NOTE — Progress Notes (Signed)
 PHARMACY - ANTICOAGULATION CONSULT NOTE  Pharmacy Consult for heparin  infusion Indication: atrial fibrillation  Allergies  Allergen Reactions   Cipro [Ciprofloxacin Hcl] Other (See Comments)    Increased risk of rupture  of ascending thoracic aortic aneurysm    Patient Measurements: Height: 5' 11 (180.3 cm) Weight: 75.2 kg (165 lb 12.6 oz) IBW/kg (Calculated) : 75.3 HEPARIN  DW (KG): 76.2  Vital Signs: Temp: 97.8 F (36.6 C) (09/02 1334) Temp Source: Oral (09/02 1334) BP: 112/49 (09/02 1334) Pulse Rate: 71 (09/02 1334)  Labs: Recent Labs     0000 09/06/23 1900 09/07/23 0500 09/07/23 0953 09/08/23 0500 09/08/23 0700 09/08/23 1830 09/09/23 0454 09/09/23 1300  HGB   < >  --  8.0*  --  8.1*  --   --  7.8*  --   HCT  --   --  24.8*  --  25.0*  --   --  24.2*  --   PLT  --   --  162  --  190  --   --  192  --   APTT  --  62*  --   --   --   --   --   --   --   HEPARINUNFRC  --  <0.10*  --    < >  --    < > 0.17* 0.33 0.33  CREATININE  --   --  1.26*  --  1.12  --   --  1.00  --    < > = values in this interval not displayed.    Estimated Creatinine Clearance: 65.8 mL/min (by C-G formula based on SCr of 1 mg/dL).   Assessment: 78 yo male who presented for decompression of lumbar burst fracture. PMH significant for permanent Afib (on Xarelto , LD 8/23), thoracic aortic aneurysm, CAD. Subsequently went into NSVT and failed cardioversion and adenosine. Now cleared by EP. Pharmacy consulted for heparin  management.  Confirmatory heparin  level 0.33 is at goal on 1450 units/hr. Hgb (7.8) and PLTs (192) are stable.  No issues noted.  Goal of Therapy:  Heparin  level 0.3-0.5 units/ml Monitor platelets by anticoagulation protocol: Yes   Plan:  Continue heparin  infusion 1450 units/hr Monitor daily heparin  level, CBC, and sign/symptoms of bleeding F/u neurosurgery plan to transition to PTA DOAC (likely 9/3 per note)  Thank you for allowing pharmacy to be a part of this  patient's care.   Maurilio Fila, PharmD Clinical Pharmacist 09/09/2023  2:15 PM

## 2023-09-09 NOTE — Progress Notes (Signed)
 Patient ID: Aaron Richmond, male   DOB: 1945-05-11, 78 y.o.   MRN: [id number]     Advanced Heart Failure Rounding Note  Cardiologist: None   Chief Complaint: CHF  Subjective:    SBP 100s, still with wide pulse pressure.  HR 70s today, may be back in NSR with PACs.   He has done some walking.  Mild lightheadedness sitting on side of bed.    Objective:   Weight Range: 75.2 kg Body mass index is 23.12 kg/m.   Vital Signs:   Temp:  [98.2 F (36.8 C)-98.8 F (37.1 C)] 98.2 F (36.8 C) (09/02 0534) Pulse Rate:  [67-94] 67 (09/02 0534) Resp:  [14-26] 20 (09/02 0534) BP: (94-125)/(34-76) 108/50 (09/02 0534) SpO2:  [79 %-95 %] 92 % (09/02 0534) Last BM Date : 09/08/23  Weight change: Filed Weights   09/03/23 0601 09/04/23 0555 09/06/23 0652  Weight: 76.2 kg 78 kg 75.2 kg    Intake/Output:   Intake/Output Summary (Last 24 hours) at 09/09/2023 0732 Last data filed at 09/09/2023 0553 Gross per 24 hour  Intake 352.49 ml  Output 650 ml  Net -297.51 ml      Physical Exam    General: NAD Neck: No JVD, no thyromegaly or thyroid  nodule.  Lungs: Clear to auscultation bilaterally with normal respiratory effort. CV: Nondisplaced PMI.  Heart regular S1/S2, no S3/S4, 1/6 diastolic murmur LLSB.  No peripheral edema.    Abdomen: Soft, nontender, no hepatosplenomegaly, no distention.  Skin: Intact without lesions or rashes.  Neurologic: Alert and oriented x 3.  Psych: Normal affect. Extremities: No clubbing or cyanosis.  HEENT: Normal.   Telemetry   ?NSR with PACs (personally reviewed)  Labs    CBC Recent Labs    09/08/23 0500 09/09/23 0454  WBC 9.4 8.2  HGB 8.1* 7.8*  HCT 25.0* 24.2*  MCV 96.2 94.9  PLT 190 192   Basic Metabolic Panel Recent Labs    90/98/74 0500 09/09/23 0454  NA 138 137  K 4.3 4.3  CL 101 102  CO2 28 26  GLUCOSE 104* 96  BUN 16 13  CREATININE 1.12 1.00  CALCIUM  8.7* 8.4*  MG 2.2 2.2  PHOS 2.2* 2.3*   Liver Function Tests Recent  Labs    09/09/23 0454  AST 29  ALT 10  ALKPHOS 61  BILITOT 0.6  PROT 5.2*  ALBUMIN  2.6*   No results for input(s): LIPASE, AMYLASE in the last 72 hours. Cardiac Enzymes No results for input(s): CKTOTAL, CKMB, CKMBINDEX, TROPONINI in the last 72 hours.  BNP: BNP (last 3 results) No results for input(s): BNP in the last 8760 hours.  ProBNP (last 3 results) Recent Labs    11/08/22 1623  PROBNP 1,382*     D-Dimer No results for input(s): DDIMER in the last 72 hours. Hemoglobin A1C No results for input(s): HGBA1C in the last 72 hours. Fasting Lipid Panel No results for input(s): CHOL, HDL, LDLCALC, TRIG, CHOLHDL, LDLDIRECT in the last 72 hours. Thyroid  Function Tests No results for input(s): TSH, T4TOTAL, T3FREE, THYROIDAB in the last 72 hours.  Invalid input(s): FREET3  Other results:   Imaging    No results found.    Medications:     Scheduled Medications:  amiodarone   200 mg Oral BID   bisacodyl   10 mg Rectal Daily   Chlorhexidine  Gluconate Cloth  6 each Topical Daily   cyanocobalamin   5,000 mcg Oral Q1200   methocarbamol   500 mg Oral QID   metoprolol   succinate  12.5 mg Oral BID   multivitamin with minerals  1 tablet Oral Daily   pantoprazole  (PROTONIX ) IV  40 mg Intravenous QHS   polyethylene glycol  17 g Oral BID   potassium chloride  SA  20 mEq Oral Daily   rosuvastatin   10 mg Oral Daily   sodium chloride  flush  10-40 mL Intracatheter Q12H   sodium chloride  flush  3 mL Intravenous Q12H   spironolactone   25 mg Oral Daily   tamsulosin   0.4 mg Oral Daily   torsemide   20 mg Oral Daily    Infusions:  heparin  1,450 Units/hr (09/09/23 0553)    PRN Medications: acetaminophen  **OR** acetaminophen , alum & mag hydroxide-simeth, HYDROcodone -acetaminophen , HYDROmorphone  (DILAUDID ) injection, menthol -cetylpyridinium **OR** phenol, ondansetron  **OR** ondansetron  (ZOFRAN ) IV, mouth rinse, polyethylene glycol, sodium  chloride flush, sodium chloride  flush, traMADol    Assessment/Plan   1. Shock: Patient was on NE x 3 days post-op.  He has a wide pulse pressure likely due to his moderate AI.  BP is very stable off NE, SBP 110s-120s.  Echo is stable compared to prior, EF 30-35%, mild RV dysfunction, ascending aorta 56 mm with moderate AI.  Prior echo similar with EF 35-40%, moderate AI.  - Would aim for systolic BP >/= 100 rather than using MAP.   2. Chronic systolic CHF: Nonischemic CMP.  Echo showed EF 30-35%, mild RV dysfunction, ascending aorta 56 mm with moderate AI. Nonischemic cardiomyopathy, coronary CTA in 7/25 showed extensive but nonobstructive CAD.  Cardiomyopathy may be due to permanent AF, ?elevated HR at times. CVP 8.  - Restart torsemide  20 mg daily - Would like to keep HR better-controlled given concern for tachy-mediated CMP => in 70s today, ?back to NSR. Continue Toprol  XL 12.5 mg BID.  - Increase spironolactone  to 25 mg daily.   - Restart Farxiga  when foley is out.  - GDMT limited by orthostasis - Would like him to eventually get a cardiac MRI, with back operation he does not think he could lie flat for a prolonged period of time in the MRI scanner at this point.   3. Atrial fibrillation: Though to be permanent.  Did not cardiovert to NSR when shocked for SVT. However, this morning, looks like he may be in NSR.  Has been on amiodarone  this admission.  - ECG today to confirm rhythm.  - Continue amiodarone  200 mg bid.  - Continue Toprol  XL.   - Continue heparin  gtt.  Restart Xarelto  when ok with neurosurgery => sounds like this will be Wednesday.  4. SVT: Patient had rapid SVT post-op, required DCCV.  Now on amiodarone .  - Would continue amiodarone  200 mg bid for now.  5. CAD: Nonobstructive but extensive CAD on coronary CTA.  - Continue statin.  - No ASA with anticoagulation.  6. AKI: Creatinine up to 1.6 initially.  Now down to 1.0. 7. Dilated aortic root/ascending aorta with moderate AI:  56 mm ascending aorta on echo, this is roughly stable from last CTA.   - Close followup with TCTS as outpatient.  - Moderate AI is likely causing the wide pulse pressure.  8. S/p decompression L1, fusion T10-L4: Per neurosurgery.  Needs PT.  - Graded compression stockings to help with orthostasis.   Mobilize.  Length of Stay: 6  Ezra Shuck, MD  09/09/2023, 7:32 AM  Advanced Heart Failure Team Pager [phone] (M-F; 7a - 5p)  Please contact [facility] for night-coverage after hours (5p -7a ) and weekends on [email or web address]

## 2023-09-09 NOTE — Plan of Care (Signed)
  Problem: Education: Goal: Knowledge of General Education information will improve Description: Including pain rating scale, medication(s)/side effects and non-pharmacologic comfort measures Outcome: Progressing   Problem: Clinical Measurements: Goal: Ability to maintain clinical measurements within normal limits will improve Outcome: Progressing Goal: Will remain free from infection Outcome: Progressing Goal: Diagnostic test results will improve Outcome: Progressing Goal: Respiratory complications will improve Outcome: Progressing Goal: Cardiovascular complication will be avoided Outcome: Progressing   Problem: Activity: Goal: Risk for activity intolerance will decrease Outcome: Progressing   Problem: Nutrition: Goal: Adequate nutrition will be maintained Outcome: Progressing   Problem: Elimination: Goal: Will not experience complications related to bowel motility Outcome: Progressing Goal: Will not experience complications related to urinary retention Outcome: Progressing   Problem: Pain Managment: Goal: General experience of comfort will improve and/or be controlled Outcome: Progressing   Problem: Safety: Goal: Ability to remain free from injury will improve Outcome: Progressing   Problem: Skin Integrity: Goal: Risk for impaired skin integrity will decrease Outcome: Progressing   Problem: Activity: Goal: Ability to avoid complications of mobility impairment will improve Outcome: Progressing

## 2023-09-09 NOTE — Progress Notes (Signed)
 PHARMACY - ANTICOAGULATION CONSULT NOTE  Pharmacy Consult for heparin  Indication: atrial fibrillation  Labs: Recent Labs    09/06/23 1900 09/07/23 0500 09/07/23 0953 09/08/23 0500 09/08/23 0700 09/08/23 1830 09/09/23 0454  HGB  --  8.0*  --  8.1*  --   --  7.8*  HCT  --  24.8*  --  25.0*  --   --  24.2*  PLT  --  162  --  190  --   --  192  APTT 62*  --   --   --   --   --   --   HEPARINUNFRC <0.10*  --    < >  --  0.29* 0.17* 0.33  CREATININE  --  1.26*  --  1.12  --   --   --    < > = values in this interval not displayed.   Assessment/Plan:  78yo male therapeutic on heparin  after rate change. Will continue infusion at current rate of 1450 units/hr and confirm stable with additional level.  Marvetta Dauphin, PharmD, BCPS 09/09/2023 5:32 AM

## 2023-09-09 NOTE — Progress Notes (Signed)
 Patient is AXOX4 and remains on room air with no clinical signs of distress or complaints of pain at this time.   Patient stated to this nurse if you wake me up in the middle of the night for anything and I am asleep, I will punch you square in the face.   Patient made aware of safety measures for patient's and staff.   Call bell within reach and 4P's addressed.

## 2023-09-09 NOTE — Progress Notes (Signed)
 [facility]  PROGRESS NOTE  Aaron Richmond FMW:[id number] DOB: 1945/12/03 DOA: 09/03/2023 PCP: Fleeta Valeria Mayo, MD   Brief HPI:    78 yo M PMH Afib, HFrEF, thoracic aortic aneurysm, CAD admitted to NSGY service 8/27 for transpedicular decompression L1, instrumented fusion T10-L4 for severe L1 cord compression / burst fracture. Preceding sx RLE numbness/tingling+ back pain.     Post op significant for unstable SVT. Cards consulted. Failed cardioversion x2 at 200J, failed adenosine 6 & 12. Failed add'l 200J CV attempt, and converted to Afib RVR with 360J CV, with improvement in blood pressures    Assessment/Plan:   L1 burst fracture with cord compression - Status post thoracolumbar fusion T10-L4 - Neurosurgery following -PT evaluation obtained, plan to go to inpatient rehab  Postop SVT(unstable), atrial fibrillation -Cardiology was consulted, failed cardioversion x 2 at 200 J; failed adenosine 6 mg and 12 mg -Failed distal 200 J cardioversion attempt, converted to A-fib with RVR with 360 gel -Started on heparin  GTT, okay with neurosurgery -Continue amiodarone , metoprolol  -Neurosurgery recommends to hold off on starting nonreversible medication such as Eliquis Xarelto  -Will follow neurosurgery recommendation  Chronic systolic CHF -Nonischemic cardiomyopathy, echo showed EF of 30 to 35%, with mild RV dysfunction -Nonischemic cardiomyopathy, coronary CTA on 7/25 showed extensive but nonobstructive CAD -Continue Toprol  XL, Aldactone  -Heart failure team following -GDMT limited due to orthostasis   Medications     amiodarone   200 mg Oral BID   bisacodyl   10 mg Rectal Daily   Chlorhexidine  Gluconate Cloth  6 each Topical Daily   cyanocobalamin   5,000 mcg Oral Q1200   methocarbamol   500 mg Oral QID   metoprolol  succinate  12.5 mg Oral BID   multivitamin with minerals  1 tablet Oral Daily   pantoprazole  (PROTONIX ) IV  40 mg Intravenous QHS   polyethylene glycol  17 g Oral BID    potassium chloride  SA  20 mEq Oral Daily   rosuvastatin   10 mg Oral Daily   sodium chloride  flush  10-40 mL Intracatheter Q12H   sodium chloride  flush  3 mL Intravenous Q12H   spironolactone   25 mg Oral Daily   tamsulosin   0.4 mg Oral Daily   torsemide   20 mg Oral Daily     Data Reviewed:   CBG:  No results for input(s): GLUCAP in the last 168 hours.  SpO2: 95 % O2 Flow Rate (L/min): 2 L/min    Vitals:   09/09/23 0034 09/09/23 0534 09/09/23 0833 09/09/23 0927  BP: (!) 101/44 (!) 108/50 (!) 109/36 (!) 123/36  Pulse:  67 75 72  Resp: 20 20 20    Temp: 98.8 F (37.1 C) 98.2 F (36.8 C) 98 F (36.7 C)   TempSrc: Oral Oral Oral   SpO2:  92% 95%   Weight:      Height:          Data Reviewed:  Basic Metabolic Panel: Recent Labs  Lab 09/03/23 1440 09/05/23 0312 09/06/23 0500 09/06/23 0512 09/07/23 0500 09/08/23 0500 09/09/23 0454  NA 137 134* 135  --  136 138 137  K 3.9 3.9 3.4*  --  3.5 4.3 4.3  CL 103 96* 95*  --  100 101 102  CO2 20* 26 28  --  29 28 26   GLUCOSE 179* 142* 108*  --  95 104* 96  BUN 20 22 22   --  21 16 13   CREATININE 1.23 1.65* 1.31*  --  1.26* 1.12 1.00  CALCIUM  8.7* 8.3* 8.6*  --  8.7* 8.7* 8.4*  MG 1.8 2.4  --  2.2 2.1 2.2 2.2  PHOS 4.1  --   --   --  2.4* 2.2* 2.3*    CBC: Recent Labs  Lab 09/05/23 1200 09/06/23 1008 09/07/23 0500 09/08/23 0500 09/09/23 0454  WBC 13.3* 10.2 9.2 9.4 8.2  HGB 10.3* 9.2* 8.0* 8.1* 7.8*  HCT 30.8* 27.7* 24.8* 25.0* 24.2*  MCV 93.1 93.6 95.0 96.2 94.9  PLT 172 161 162 190 192    LFT Recent Labs  Lab 09/09/23 0454  AST 29  ALT 10  ALKPHOS 61  BILITOT 0.6  PROT 5.2*  ALBUMIN  2.6*     Antibiotics: Anti-infectives (From admission, onward)    Start     Dose/Rate Route Frequency Ordered Stop   09/03/23 1800  ceFAZolin  (ANCEF ) IVPB 2g/100 mL premix        2 g 200 mL/hr over 30 Minutes Intravenous Every 8 hours 09/03/23 1711 09/05/23 1105   09/03/23 0600  ceFAZolin  (ANCEF ) IVPB 2g/100  mL premix        2 g 200 mL/hr over 30 Minutes Intravenous On call to O.R. 09/03/23 0554 09/03/23 0754        DVT prophylaxis: Heparin   Code Status: Full code  Family Communication: Discussed with patient's wife at bedside   CONSULTS cardiology, neurosurgery   Subjective   Patient seen and examined.  No new complaints.  Pain well-controlled   Objective    Physical Examination:  General-appears in no acute distress Heart-S1-S2, regular, no murmur auscultated Lungs-clear to auscultation bilaterally, no wheezing or crackles auscultated Abdomen-soft, nontender, no organomegaly Extremities-no edema in the lower extremities Neuro-alert, oriented x3, no focal deficit noted  Status is: Inpatient:             [name]   [facility] If 7PM-7AM, please contact night-coverage at [email or web address], Office  [phone]   09/09/2023, 9:35 AM  LOS: 6 days

## 2023-09-09 NOTE — Plan of Care (Signed)
  Problem: Education: Goal: Knowledge of General Education information will improve Description: Including pain rating scale, medication(s)/side effects and non-pharmacologic comfort measures Outcome: Progressing   Problem: Health Behavior/Discharge Planning: Goal: Ability to manage health-related needs will improve Outcome: Progressing   Problem: Clinical Measurements: Goal: Ability to maintain clinical measurements within normal limits will improve Outcome: Progressing Goal: Will remain free from infection Outcome: Progressing   Problem: Activity: Goal: Risk for activity intolerance will decrease Outcome: Progressing   Problem: Nutrition: Goal: Adequate nutrition will be maintained Outcome: Progressing   Problem: Coping: Goal: Level of anxiety will decrease Outcome: Progressing   Problem: Elimination: Goal: Will not experience complications related to urinary retention Outcome: Progressing   Problem: Pain Managment: Goal: General experience of comfort will improve and/or be controlled Outcome: Progressing   Problem: Safety: Goal: Ability to remain free from injury will improve Outcome: Progressing

## 2023-09-09 NOTE — Progress Notes (Signed)
 Inpatient Rehab Admissions Coordinator:    CIR following. Pt. Appears stable this AM, I sent case to insurance.  Leita Kleine, MS, CCC-SLP Rehab Admissions Coordinator  [phone] (celll) [phone] (office)

## 2023-09-09 NOTE — TOC Progression Note (Signed)
 Transition of Care [facility]) - Progression Note    Patient Details  Name: Aaron Richmond MRN: [id number] Date of Birth: 15-Mar-1945  [facility] CM/SW Contact  Justina Delcia Czar, RN Phone Number: [phone] 09/09/2023, 3:57 PM  Clinical Narrative:     Chart reviewed for discharge readiness, CIR following and will submit insurance Auth when medically ready.   Inpatient CM/CSW will continue to monitor pt's advancement through interdisciplinary progression rounds. If new pt transition needs arise, MD please place a TOC consult.    Expected Discharge Plan: Home w Home Health Services Barriers to Discharge: Continued Medical Work up               Expected Discharge Plan and Services   Discharge Planning Services: CM Consult Post Acute Care Choice: Home Health Living arrangements for the past 2 months: Single Family Home                                       Social Drivers of Health (SDOH) Interventions SDOH Screenings   Food Insecurity: No Food Insecurity (09/03/2023)  Housing: Unknown (09/03/2023)  Transportation Needs: No Transportation Needs (09/03/2023)  Utilities: Not At Risk (09/03/2023)  Depression (PHQ2-9): Low Risk  (03/10/2023)  Social Connections: Socially Integrated (09/03/2023)  Tobacco Use: Low Risk  (09/03/2023)    Readmission Risk Interventions     No data to display

## 2023-09-09 NOTE — Progress Notes (Signed)
 Physical Therapy Treatment Patient Details Name: Aaron Richmond MRN: [id number] DOB: April 07, 1945 Today's Date: 09/09/2023      History of Present Illness Pt is a 78 y/o male admitted 09/03/23 for planned transpedicular decompression of L1 instrumented fusion from T10-L4. During Sx pt noted to be in a narrow complex tachycardia and hypotension requiring pressor support and cardioversion x2 and one shock. PMH includes Aneurysm of aortic root, Aortic atherosclerosis, Aortic regurgitation, Aortic valve insufficiency, Ascending aortic aneurysm, Atherosclerotic vascular disease, Atrial fibrillation, CHF, Closed fracture of right hip, Dysrhythmia, HTN, Gastrointestinal hemorrhage, Heart murmur, History of cardiomyopathy, PE, Lumbar radiculopathy, OSA, Prostate cancer metastatic to bone, Rhinitis      PTA Comments  Pt received in supine, HOB 37 deg, spouse assisting him to eat dinner. Pt agreeable to assistance with repositioning and instruction on back precs, but requests to finish eating prior to OOB. Pt needing up to modA for reclined supine posture to more upright long sitting type posture with HHA and L bed rail, but defers EOB transfer or OOB until done eating. Pt reports more comfortable after repositioning and pt instructed on benefits of upright posture for improved pulmonary clearance and to reduce risk of aspiration with meals/post-op. Pt agreeable to transfer training once he is done eating, PTA to return as schedule permits for OOB mobility. Pt continues to benefit from PT services to progress toward functional mobility goals.     Subjective Data  Patient Stated Goal Get well, return home  Precautions  Precautions Back  Precaution Booklet Issued Yes (comment) at end of session  Recall of Precautions/Restrictions Intact  Precaution/Restrictions Comments able to state 3/3 precautions but not able to explain what no bending means and needs reminders to avoid twisting his back while pivoting  quickly to [facility].  Required Braces or Orthoses Spinal Brace;Other Brace  Spinal Brace Lumbar corset  Other Brace AFO RLE  Restrictions  Weight Bearing Restrictions Per Provider Order No  Pain Assessment  Pain Assessment Faces  Faces Pain Scale 4  Pain Location back, Rt heel  Pain Descriptors / Indicators Tender;Discomfort  Pain Intervention(s) Limited activity within patient's tolerance;Monitored during session;Repositioned  Cognition  Arousal Alert  Behavior During Therapy WFL for tasks assessed/performed  PT - Cognitive impairments No apparent impairments;Sequencing  PT - Cognition Comments pt received in bed with HOB 37 deg, attempting to eat dinner with spouse assisting him. Pt agreeable to repositioning in more upright posture in bed chair position prior to OOB transfer training, pt does not have handout in room on back precs, able to state no BLT when prompted but needs instruction on what that means in more detail.  Following Commands  Following commands Intact  Cueing  Cueing Techniques Verbal cues;Gestural cues  Communication  Communication Impaired  Factors Affecting Communication Hearing impaired  Bed Mobility  Overal bed mobility Needs Assistance  Bed Mobility Supine to Sit  Sidelying to sit Used rails;HOB elevated;Mod assist  General bed mobility comments pt assisted to nearly long sitting with LUE on bed rail and HHA to place pillow behind mid-back and neck as he had craned head forward and with in more sacral sitting/reclined posture prior to session. once pillow repositioned, pt and spouse report pt comfortable and requesting to finish eating dinner before OOB to chair or BSC transfer attempts.  Transfers  Overall transfer level Needs assistance  General transfer comment pt defers until after done eating.  Ambulation/Gait  General Gait Details pt defer until after eating  Balance  Overall balance  assessment Needs assistance  Sitting-balance support Bilateral upper  extremity supported;Feet supported  Sitting balance-Leahy Scale Poor  Sitting balance - Comments needs 1-2 UE support for attempt to sit up in bed; does not flex trunk past 90 deg while repositioning  Standing balance comment pt defers until afte meal  General Comments  General comments (skin integrity, edema, etc.) heels floated while in supine; pt and spouse instructed on bed chair posture on hospital bed and that staff can assist with this PRN.  PT - End of Session  Equipment Utilized During Treatment Gait belt;Back brace;Other (comment) (RLE AFO)  Activity Tolerance Other (comment);Patient limited by fatigue (eating dinner before OOB)  Patient left in bed;with call bell/phone within reach;with bed alarm set;with family/visitor present (spouse assisting him to eat)  Nurse Communication Mobility status;Precautions;Other (comment) (pt repositioned)   PT - Assessment/Plan  PT Visit Diagnosis Unsteadiness on feet (R26.81);Other abnormalities of gait and mobility (R26.89);Muscle weakness (generalized) (M62.81);History of falling (Z91.81);Difficulty in walking, not elsewhere classified (R26.2);Other symptoms and signs involving the nervous system (R29.898);Pain  Pain - part of body  (back, Rt heel)  PT Frequency (ACUTE ONLY) Min 3X/week  Recommendations for Other Services Rehab consult  Follow Up Recommendations Acute inpatient rehab (3hours/day)  Patient can return home with the following A lot of help with bathing/dressing/bathroom;Assistance with cooking/housework;Assist for transportation;Help with stairs or ramp for entrance;Two people to help with walking and/or transfers  PT equipment None recommended by PT (TBD)  AM-PAC PT 6 Clicks Mobility Outcome Measure (Version 2)  Help needed turning from your back to your side while in a flat bed without using bedrails? 3  Help needed moving from lying on your back to sitting on the side of a flat bed without using bedrails? 2 (w/o rails)   Help needed moving to and from a bed to a chair (including a wheelchair)? 2  Help needed standing up from a chair using your arms (e.g., wheelchair or bedside chair)? 2  Help needed to walk in hospital room? 1  Help needed climbing 3-5 steps with a railing?  1  6 Click Score 11  Consider Recommendation of Discharge To: CIR/SNF/LTACH  Progressive Mobility  What is the highest level of mobility based on the mobility assessment? Level 3 (Stands with assistance) - Balance while standing  and cannot march in place  Mobility Referral  (+2 safety)  Activity Moved bed into chair position  PT Goal Progression  Progress towards PT goals Progressing toward goals  Acute Rehab PT Goals  PT Goal Formulation With patient  Time For Goal Achievement 09/18/23  PT Time Calculation  PT Start Time (ACUTE ONLY) 1632  PT Stop Time (ACUTE ONLY) 1640  PT Time Calculation (min) (ACUTE ONLY) 8 min  PT General Charges  $$ ACUTE PT VISIT 1 Visit  PT Treatments  $Therapeutic Activity 8-22 mins

## 2023-09-09 NOTE — Progress Notes (Signed)
 Subjective: Patient reports pain well controlled increased right lower extremity function  Objective: Vital signs in last 24 hours: Temp:  [98.2 F (36.8 C)-98.8 F (37.1 C)] 98.2 F (36.8 C) (09/02 0534) Pulse Rate:  [67-94] 67 (09/02 0534) Resp:  [14-26] 20 (09/02 0833) BP: (94-125)/(34-76) 108/50 (09/02 0534) SpO2:  [79 %-95 %] 95 % (09/02 0833)  Intake/Output from previous day: 09/01 0701 - 09/02 0700 In: 352.5 [I.V.:352.5] Out: 650 [Urine:450; Blood:200] Intake/Output this shift: No intake/output data recorded.  Strength right-sided footdrop otherwise 5/5 incision clean dry and intact  Lab Results: Recent Labs    09/08/23 0500 09/09/23 0454  WBC 9.4 8.2  HGB 8.1* 7.8*  HCT 25.0* 24.2*  PLT 190 192   BMET Recent Labs    09/08/23 0500 09/09/23 0454  NA 138 137  K 4.3 4.3  CL 101 102  CO2 28 26  GLUCOSE 104* 96  BUN 16 13  CREATININE 1.12 1.00  CALCIUM  8.7* 8.4*    Studies/Results: No results found.  Assessment/Plan:  Postop day  6 doing well from a surgical standpoint on heparin  to manage his atrial fibrillation work on physical therapy occupational therapy decision to be made await recommendations with regard to inpatient rehab versus outpatient with home health.  LOS: 6 days     Arley SHAUNNA Helling 09/09/2023, 8:34 AM

## 2023-09-10 ENCOUNTER — Inpatient Hospital Stay (HOSPITAL_COMMUNITY)
Admission: AD | Admit: 2023-09-10 | Discharge: 2023-09-24 | DRG: 945 | Disposition: A | Discharge status: Home / Self Care | Source: Intra-hospital | Attending: Physical Medicine and Rehabilitation | Admitting: Physical Medicine and Rehabilitation

## 2023-09-10 ENCOUNTER — Inpatient Hospital Stay (HOSPITAL_COMMUNITY)

## 2023-09-10 ENCOUNTER — Encounter (HOSPITAL_COMMUNITY): Payer: Self-pay | Admitting: Physical Medicine and Rehabilitation

## 2023-09-10 ENCOUNTER — Other Ambulatory Visit: Payer: Self-pay

## 2023-09-10 DIAGNOSIS — F54 Psychological and behavioral factors associated with disorders or diseases classified elsewhere: Secondary | ICD-10-CM

## 2023-09-10 DIAGNOSIS — G8222 Paraplegia, incomplete: Secondary | ICD-10-CM | POA: Diagnosis not present

## 2023-09-10 DIAGNOSIS — Z4789 Encounter for other orthopedic aftercare: Principal | ICD-10-CM

## 2023-09-10 DIAGNOSIS — M21371 Foot drop, right foot: Secondary | ICD-10-CM | POA: Diagnosis not present

## 2023-09-10 DIAGNOSIS — S34101D Unspecified injury to L1 level of lumbar spinal cord, subsequent encounter: Secondary | ICD-10-CM

## 2023-09-10 DIAGNOSIS — S32011D Stable burst fracture of first lumbar vertebra, subsequent encounter for fracture with routine healing: Secondary | ICD-10-CM

## 2023-09-10 DIAGNOSIS — Z7901 Long term (current) use of anticoagulants: Secondary | ICD-10-CM

## 2023-09-10 DIAGNOSIS — W19XXXD Unspecified fall, subsequent encounter: Secondary | ICD-10-CM | POA: Diagnosis present

## 2023-09-10 DIAGNOSIS — Z8546 Personal history of malignant neoplasm of prostate: Secondary | ICD-10-CM

## 2023-09-10 DIAGNOSIS — I4821 Permanent atrial fibrillation: Secondary | ICD-10-CM | POA: Diagnosis not present

## 2023-09-10 DIAGNOSIS — S32001A Stable burst fracture of unspecified lumbar vertebra, initial encounter for closed fracture: Principal | ICD-10-CM | POA: Diagnosis present

## 2023-09-10 DIAGNOSIS — I4891 Unspecified atrial fibrillation: Secondary | ICD-10-CM | POA: Diagnosis not present

## 2023-09-10 DIAGNOSIS — R7303 Prediabetes: Secondary | ICD-10-CM | POA: Diagnosis not present

## 2023-09-10 DIAGNOSIS — Z981 Arthrodesis status: Secondary | ICD-10-CM

## 2023-09-10 DIAGNOSIS — N183 Chronic kidney disease, stage 3 unspecified: Secondary | ICD-10-CM

## 2023-09-10 DIAGNOSIS — I428 Other cardiomyopathies: Secondary | ICD-10-CM | POA: Diagnosis not present

## 2023-09-10 DIAGNOSIS — Z833 Family history of diabetes mellitus: Secondary | ICD-10-CM

## 2023-09-10 DIAGNOSIS — I2510 Atherosclerotic heart disease of native coronary artery without angina pectoris: Secondary | ICD-10-CM | POA: Diagnosis not present

## 2023-09-10 DIAGNOSIS — T8130XA Disruption of wound, unspecified, initial encounter: Secondary | ICD-10-CM | POA: Diagnosis not present

## 2023-09-10 DIAGNOSIS — I080 Rheumatic disorders of both mitral and aortic valves: Secondary | ICD-10-CM | POA: Diagnosis present

## 2023-09-10 DIAGNOSIS — I700 Atherosclerosis of aorta: Secondary | ICD-10-CM | POA: Diagnosis present

## 2023-09-10 DIAGNOSIS — G4733 Obstructive sleep apnea (adult) (pediatric): Secondary | ICD-10-CM | POA: Diagnosis not present

## 2023-09-10 DIAGNOSIS — Z820 Family history of epilepsy and other diseases of the nervous system: Secondary | ICD-10-CM

## 2023-09-10 DIAGNOSIS — I493 Ventricular premature depolarization: Secondary | ICD-10-CM | POA: Diagnosis not present

## 2023-09-10 DIAGNOSIS — S32001S Stable burst fracture of unspecified lumbar vertebra, sequela: Secondary | ICD-10-CM | POA: Diagnosis not present

## 2023-09-10 DIAGNOSIS — I7121 Aneurysm of the ascending aorta, without rupture: Secondary | ICD-10-CM | POA: Diagnosis present

## 2023-09-10 DIAGNOSIS — N1831 Chronic kidney disease, stage 3a: Secondary | ICD-10-CM | POA: Diagnosis present

## 2023-09-10 DIAGNOSIS — D62 Acute posthemorrhagic anemia: Secondary | ICD-10-CM | POA: Diagnosis not present

## 2023-09-10 DIAGNOSIS — I5022 Chronic systolic (congestive) heart failure: Secondary | ICD-10-CM | POA: Diagnosis present

## 2023-09-10 DIAGNOSIS — N400 Enlarged prostate without lower urinary tract symptoms: Secondary | ICD-10-CM | POA: Diagnosis present

## 2023-09-10 DIAGNOSIS — R579 Shock, unspecified: Secondary | ICD-10-CM | POA: Diagnosis not present

## 2023-09-10 DIAGNOSIS — I351 Nonrheumatic aortic (valve) insufficiency: Secondary | ICD-10-CM | POA: Diagnosis not present

## 2023-09-10 DIAGNOSIS — Z79899 Other long term (current) drug therapy: Secondary | ICD-10-CM

## 2023-09-10 DIAGNOSIS — L89610 Pressure ulcer of right heel, unstageable: Secondary | ICD-10-CM | POA: Diagnosis not present

## 2023-09-10 DIAGNOSIS — K5900 Constipation, unspecified: Secondary | ICD-10-CM | POA: Diagnosis not present

## 2023-09-10 DIAGNOSIS — I4819 Other persistent atrial fibrillation: Secondary | ICD-10-CM

## 2023-09-10 DIAGNOSIS — Z881 Allergy status to other antibiotic agents status: Secondary | ICD-10-CM

## 2023-09-10 DIAGNOSIS — Z8249 Family history of ischemic heart disease and other diseases of the circulatory system: Secondary | ICD-10-CM

## 2023-09-10 DIAGNOSIS — Z86711 Personal history of pulmonary embolism: Secondary | ICD-10-CM

## 2023-09-10 DIAGNOSIS — I130 Hypertensive heart and chronic kidney disease with heart failure and stage 1 through stage 4 chronic kidney disease, or unspecified chronic kidney disease: Secondary | ICD-10-CM | POA: Diagnosis present

## 2023-09-10 DIAGNOSIS — D649 Anemia, unspecified: Secondary | ICD-10-CM | POA: Diagnosis not present

## 2023-09-10 DIAGNOSIS — I25810 Atherosclerosis of coronary artery bypass graft(s) without angina pectoris: Secondary | ICD-10-CM

## 2023-09-10 DIAGNOSIS — K5901 Slow transit constipation: Secondary | ICD-10-CM | POA: Diagnosis not present

## 2023-09-10 DIAGNOSIS — Z823 Family history of stroke: Secondary | ICD-10-CM

## 2023-09-10 DIAGNOSIS — C7951 Secondary malignant neoplasm of bone: Secondary | ICD-10-CM | POA: Diagnosis not present

## 2023-09-10 DIAGNOSIS — E7800 Pure hypercholesterolemia, unspecified: Secondary | ICD-10-CM | POA: Diagnosis not present

## 2023-09-10 LAB — CBC
HCT: 26.6 % — ABNORMAL LOW (ref 39.0–52.0)
Hemoglobin: 8.7 g/dL — ABNORMAL LOW (ref 13.0–17.0)
MCH: 30.9 pg (ref 26.0–34.0)
MCHC: 32.7 g/dL (ref 30.0–36.0)
MCV: 94.3 fL (ref 80.0–100.0)
Platelets: 252 K/uL (ref 150–400)
RBC: 2.82 MIL/uL — ABNORMAL LOW (ref 4.22–5.81)
RDW: 15.1 % (ref 11.5–15.5)
WBC: 10.2 K/uL (ref 4.0–10.5)
nRBC: 0 % (ref 0.0–0.2)

## 2023-09-10 LAB — MAGNESIUM: Magnesium: 2.1 mg/dL (ref 1.7–2.4)

## 2023-09-10 LAB — BASIC METABOLIC PANEL WITH GFR
Anion gap: 10 (ref 5–15)
BUN: 14 mg/dL (ref 8–23)
CO2: 24 mmol/L (ref 22–32)
Calcium: 8.7 mg/dL — ABNORMAL LOW (ref 8.9–10.3)
Chloride: 97 mmol/L — ABNORMAL LOW (ref 98–111)
Creatinine, Ser: 1.06 mg/dL (ref 0.61–1.24)
GFR, Estimated: >60 mL/min (ref >60)
Glucose, Bld: 118 mg/dL — ABNORMAL HIGH (ref 70–99)
Potassium: 4 mmol/L (ref 3.5–5.1)
Sodium: 131 mmol/L — ABNORMAL LOW (ref 135–145)

## 2023-09-10 LAB — COOXEMETRY PANEL
Carboxyhemoglobin: 2.1 % — ABNORMAL HIGH (ref 0.5–1.5)
Methemoglobin: <0.7 % (ref 0.0–1.5)
O2 Saturation: 84 %
Total hemoglobin: 9.2 g/dL — ABNORMAL LOW (ref 12.0–16.0)

## 2023-09-10 LAB — PHOSPHORUS: Phosphorus: 2.7 mg/dL (ref 2.5–4.6)

## 2023-09-10 MED ORDER — RIVAROXABAN 20 MG PO TABS
20.0000 mg | ORAL_TABLET | Freq: Every day | ORAL | Status: DC
  Administered 2023-09-10: 20 mg via ORAL
  Filled 2023-09-10: qty 1

## 2023-09-10 MED ORDER — POLYETHYLENE GLYCOL 3350 17 G PO PACK: 17.0000 g | PACK | Freq: Every day | ORAL | Status: DC | PRN

## 2023-09-10 MED ORDER — DAPAGLIFLOZIN PROPANEDIOL 10 MG PO TABS
10.0000 mg | ORAL_TABLET | Freq: Every day | ORAL | Status: DC
  Administered 2023-09-11 – 2023-09-24 (×14): 10 mg via ORAL
  Filled 2023-09-10 (×14): qty 1

## 2023-09-10 MED ORDER — MENS 50+ MULTIVITAMIN PO TABS: 1.0000 | ORAL_TABLET | Freq: Every evening | ORAL | Status: DC

## 2023-09-10 MED ORDER — B-12 5000 MCG PO CAPS: 1.0000 | ORAL_CAPSULE | Freq: Every day | ORAL | Status: DC

## 2023-09-10 MED ORDER — ONDANSETRON HCL 4 MG/2ML IJ SOLN: 4.0000 mg | Freq: Four times a day (QID) | INTRAMUSCULAR | Status: DC | PRN

## 2023-09-10 MED ORDER — TAMSULOSIN HCL 0.4 MG PO CAPS
0.4000 mg | ORAL_CAPSULE | Freq: Every day | ORAL | Status: DC
  Administered 2023-09-11 – 2023-09-24 (×14): 0.4 mg via ORAL
  Filled 2023-09-10 (×14): qty 1

## 2023-09-10 MED ORDER — POTASSIUM CHLORIDE CRYS ER 20 MEQ PO TBCR: 20.0000 meq | EXTENDED_RELEASE_TABLET | Freq: Every day | ORAL | Status: DC

## 2023-09-10 MED ORDER — ONDANSETRON HCL 4 MG PO TABS
4.0000 mg | ORAL_TABLET | Freq: Four times a day (QID) | ORAL | Status: DC | PRN
Start: 2023-09-10 — End: 2023-09-24

## 2023-09-10 MED ORDER — PANTOPRAZOLE SODIUM 40 MG PO TBEC
40.0000 mg | DELAYED_RELEASE_TABLET | Freq: Every day | ORAL | Status: DC
  Administered 2023-09-10 – 2023-09-24 (×15): 40 mg via ORAL
  Filled 2023-09-10 (×15): qty 1

## 2023-09-10 MED ORDER — HYDROCODONE-ACETAMINOPHEN 5-325 MG PO TABS: 2.0000 | ORAL_TABLET | ORAL | Status: DC | PRN

## 2023-09-10 MED ORDER — TRAMADOL HCL 50 MG PO TABS
100.0000 mg | ORAL_TABLET | Freq: Three times a day (TID) | ORAL | Status: DC | PRN
  Administered 2023-09-12 – 2023-09-21 (×7): 100 mg via ORAL
  Filled 2023-09-10 (×8): qty 2

## 2023-09-10 MED ORDER — POLYETHYLENE GLYCOL 3350 17 G PO PACK
17.0000 g | PACK | Freq: Two times a day (BID) | ORAL | Status: DC
  Administered 2023-09-10 – 2023-09-18 (×12): 17 g via ORAL
  Filled 2023-09-10 (×23): qty 1

## 2023-09-10 MED ORDER — AMIODARONE HCL 200 MG PO TABS: 200.0000 mg | ORAL_TABLET | Freq: Two times a day (BID) | ORAL | Status: DC

## 2023-09-10 MED ORDER — DAPAGLIFLOZIN PROPANEDIOL 10 MG PO TABS: 10.0000 mg | ORAL_TABLET | Freq: Every day | ORAL | Status: DC

## 2023-09-10 MED ORDER — ACETAMINOPHEN 325 MG PO TABS: 650.0000 mg | ORAL_TABLET | ORAL | Status: DC | PRN

## 2023-09-10 MED ORDER — ADULT MULTIVITAMIN W/MINERALS CH
1.0000 | ORAL_TABLET | Freq: Every day | ORAL | Status: DC
  Administered 2023-09-11 – 2023-09-24 (×14): 1 via ORAL
  Filled 2023-09-10 (×14): qty 1

## 2023-09-10 MED ORDER — BISACODYL 10 MG RE SUPP: 10.0000 mg | Freq: Every day | RECTAL | Status: DC

## 2023-09-10 MED ORDER — SPIRONOLACTONE 25 MG PO TABS: 25.0000 mg | ORAL_TABLET | Freq: Every day | ORAL | Status: DC

## 2023-09-10 MED ORDER — RIVAROXABAN 20 MG PO TABS
20.0000 mg | ORAL_TABLET | Freq: Every day | ORAL | Status: DC
  Administered 2023-09-11 – 2023-09-22 (×12): 20 mg via ORAL
  Filled 2023-09-10 (×12): qty 1

## 2023-09-10 MED ORDER — DAPAGLIFLOZIN PROPANEDIOL 10 MG PO TABS
10.0000 mg | ORAL_TABLET | Freq: Every day | ORAL | Status: DC
  Administered 2023-09-10: 10 mg via ORAL
  Filled 2023-09-10: qty 1

## 2023-09-10 MED ORDER — ONDANSETRON HCL 4 MG PO TABS: 4.0000 mg | ORAL_TABLET | Freq: Four times a day (QID) | ORAL | Status: DC | PRN

## 2023-09-10 MED ORDER — POTASSIUM CHLORIDE CRYS ER 20 MEQ PO TBCR
20.0000 meq | EXTENDED_RELEASE_TABLET | Freq: Every day | ORAL | Status: DC
  Administered 2023-09-11 – 2023-09-24 (×14): 20 meq via ORAL
  Filled 2023-09-10 (×14): qty 1

## 2023-09-10 MED ORDER — VITAMIN B-12 1000 MCG PO TABS
5000.0000 ug | ORAL_TABLET | Freq: Every day | ORAL | Status: DC
  Administered 2023-09-11 – 2023-09-23 (×13): 5000 ug via ORAL
  Filled 2023-09-10 (×13): qty 5

## 2023-09-10 MED ORDER — AMIODARONE HCL 200 MG PO TABS
200.0000 mg | ORAL_TABLET | Freq: Two times a day (BID) | ORAL | Status: DC
  Administered 2023-09-10 – 2023-09-24 (×28): 200 mg via ORAL
  Filled 2023-09-10 (×28): qty 1

## 2023-09-10 MED ORDER — BISACODYL 10 MG RE SUPP
10.0000 mg | Freq: Every day | RECTAL | Status: DC
  Filled 2023-09-10 (×10): qty 1

## 2023-09-10 MED ORDER — ALUM & MAG HYDROXIDE-SIMETH 200-200-20 MG/5ML PO SUSP: 30.0000 mL | Freq: Four times a day (QID) | ORAL | Status: DC | PRN

## 2023-09-10 MED ORDER — HYDROCODONE-ACETAMINOPHEN 5-325 MG PO TABS
2.0000 | ORAL_TABLET | ORAL | Status: DC | PRN
  Administered 2023-09-11 – 2023-09-24 (×46): 2 via ORAL
  Filled 2023-09-10 (×46): qty 2

## 2023-09-10 MED ORDER — METHOCARBAMOL 500 MG PO TABS
500.0000 mg | ORAL_TABLET | Freq: Four times a day (QID) | ORAL | Status: DC
  Administered 2023-09-10 – 2023-09-24 (×54): 500 mg via ORAL
  Filled 2023-09-10 (×55): qty 1

## 2023-09-10 MED ORDER — RIVAROXABAN 20 MG PO TABS: 20.0000 mg | ORAL_TABLET | Freq: Every day | ORAL | Status: DC

## 2023-09-10 MED ORDER — ROSUVASTATIN CALCIUM 5 MG PO TABS
10.0000 mg | ORAL_TABLET | Freq: Every day | ORAL | Status: DC
  Administered 2023-09-11 – 2023-09-24 (×14): 10 mg via ORAL
  Filled 2023-09-10 (×14): qty 2

## 2023-09-10 MED ORDER — ACETAMINOPHEN 650 MG RE SUPP: 650.0000 mg | RECTAL | Status: DC | PRN

## 2023-09-10 MED ORDER — METOPROLOL SUCCINATE ER 25 MG PO TB24
12.5000 mg | ORAL_TABLET | Freq: Two times a day (BID) | ORAL | Status: DC
  Administered 2023-09-10 – 2023-09-24 (×27): 12.5 mg via ORAL
  Filled 2023-09-10 (×28): qty 1

## 2023-09-10 MED ORDER — TORSEMIDE 20 MG PO TABS
20.0000 mg | ORAL_TABLET | Freq: Every day | ORAL | Status: DC
  Administered 2023-09-11 – 2023-09-16 (×6): 20 mg via ORAL
  Filled 2023-09-10 (×6): qty 1

## 2023-09-10 MED ORDER — TORSEMIDE 20 MG PO TABS: 20.0000 mg | ORAL_TABLET | Freq: Every day | ORAL | Status: DC

## 2023-09-10 MED ORDER — SPIRONOLACTONE 25 MG PO TABS
25.0000 mg | ORAL_TABLET | Freq: Every day | ORAL | Status: DC
  Administered 2023-09-11 – 2023-09-24 (×14): 25 mg via ORAL
  Filled 2023-09-10 (×14): qty 1

## 2023-09-10 MED ORDER — ORAL CARE MOUTH RINSE: 15.0000 mL | OROMUCOSAL | Status: DC | PRN

## 2023-09-10 MED ORDER — METOPROLOL SUCCINATE ER 25 MG PO TB24: 12.5000 mg | ORAL_TABLET | Freq: Two times a day (BID) | ORAL | Status: DC

## 2023-09-10 NOTE — Progress Notes (Signed)
 PMR Admission Coordinator Pre-Admission Assessment   Patient: Aaron Richmond is an 78 y.o., male MRN: [id number] DOB: 21-Jan-1945 Height: 5' 11 (180.3 cm) Weight: 75.2 kg                                                                                                                                                  Insurance Information HMO:  PPO: yes     PCP:      IPA:      80/20:      OTHER:  PRIMARY: [employer]      Policy#: [id number],  Medicare: [id number]      Subscriber:  CM Name: Aaron Richmond      Phone#: [phone]     Fax#: [phone] I obtained  auth for CIR from Aaron Richmond  with [employer] for admit 9/3 through 09/16/23.  Pre-Cert#: [id number]      Employer:  Benefits:  Phone #:      Name:  Aaron Richmond Date: 01/08/2023 - 01/07/2024 Deductible: no deductible ($0) OOP Max: $3,400 ($3,400 met) CIR: $325/day co-pay for days 1-6, $0/day days 7-90 SNF:  $0/day co-pay for days 1-20, $214/day co-pay for days 21-100; limited to 100 days/benefit period Outpatient: $15/visit co-pay; limited by medical necessity Home Health:  100% coverage DME: 75% coverage; 25% co-insurance Providers: in network Eff Date: 01/08/2023 - 01/07/2024   Deductible: no deductible ($0)   OOP Max: $3,400 ($3,400 met)       CIR: $325/day co-pay for days 1-6, $0/day days 7-90   SNF:  $0/day co-pay for days 1-20, $214/day co-pay for days 21-100; limited to 100 days/benefit period   Outpatient: $15/visit co-pay; limited by medical necessity   Home Health:  100% coverage   DME: 75% coverage; 25% co-insurance  SECONDARY:       Policy#:       Phone#:    Artist:       Phone#:    The Engineer, materials Information Summary" for patients in Inpatient Rehabilitation Facilities with attached "Privacy Act Statement-Health Care Records" was provided and verbally reviewed with: Patient   Emergency Contact Information Contact Information       Name Relation Home Work Mobile    Aaron Richmond Spouse  [phone]   [phone]    Aaron, Richmond     [phone]         Other Contacts   None on File      Current Medical History  Patient Admitting Diagnosis: Spinal Cord Compression  History of Present Illness: Aaron Richmond is a 78 y.o. male who has past medical history of aortic root aneurysm, CHF, CKD, hypertension, lumbar radiculopathy, prediabetes, CKD, heart failure, OSA, A-fib, CAD who was admitted for spinal fusion after he was found to have severe spinal cord compression at L1 burst fracture status post kyphoplasty.  Patient had history of prior  L4 S1 fusion as well as kyphoplasty but developed progressive back pain, numbness and weakness in his legs.  Pt. Was admitted to Nuerosurgery service ad [facility] on 09/03/2023 and  had T10-L4 fusion by Dr. Onetha on 09/03/2023.  Post operatively, Patient developed narrow complex tachycardia with hypotension, initially failed cardioversion attempts but later converted to A-fib RVR with 360 J shock with improved pressures.  Patient treated with amiodarone  and pressors for hypotension. Prior to admission pt reports limited mobility due to back pain. He was ambulating with the walker mostly for shorter distances. Has chronic R foot drop that started after hip surgery, reports he had recent EMG/NCS. Has a AFO for his RLE.   Patient was seen by PT and OT and found to have functional deficits and felt to be a candidate for inpatient rehabilitation.  Glasgow Coma Scale Score: 15   Patient's medical [facility] from [facility] has been reviewed by the rehabilitation admission coordinator and physician.   Past Medical History      Past Medical History:  Diagnosis Date   Aneurysm of aortic root      Overview:  Last Assessment & Plan:  Planning to see Dr Army for evaluation of 5cm aneurysm.  - will check PFT in prep for possible procedure / SGY   Aortic atherosclerosis (HCC) 06/14/2022   Aortic regurgitation      Aortic valve insufficiency     Ascending aortic aneurysm (HCC) 09/11/2016   Atherosclerotic vascular disease 05/27/2022   Atrial fibrillation (HCC)     Atrial fibrillation with RVR (HCC) 07/05/2022   BMI 27.0-27.9,adult 06/14/2022   BPH (benign prostatic hyperplasia)     BRBPR (bright red blood per rectum) 05/20/2023   Cardiomyopathy (HCC) 09/11/2016   CHF (congestive heart failure) (HCC)     CKD (chronic kidney disease) stage 2, GFR 60-89 ml/min 11/23/2021   Closed fracture of right hip (HCC) 03/10/2023   Coronary artery calcification seen on CT scan 10/13/2017   Dysrhythmia     Essential hypertension 06/14/2022   Gastrointestinal hemorrhage 05/20/2023   Heart murmur     High risk medication use 07/31/2022   History of cardiomyopathy 11/09/2021   History of pulmonary embolism 09/24/2012    Overview:  Last Assessment & Plan:  Hx PE x 2 per notes, ? 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  - needs auto-titration study by [employer] or local company, data to RB to adjust his home device.    OSA (obstructive sleep apnea) 09/13/2022   Prediabetes     Primary osteoarthritis involving multiple joints 06/14/2022   Prostate cancer metastatic to bone (HCC) 06/14/2022   Pulmonary embolism (HCC)     Rhinitis     Sacral wound 04/11/2023   Seasonal allergies 06/14/2022   Spondylolisthesis of lumbar region 12/12/2021   Stage  3a chronic kidney disease (HCC) 06/14/2022   Unstageable pressure ulcer of sacral region (  HCC) 03/10/2023          Has the patient had major surgery during 100 days prior to admission? Yes   Family History  family history includes Alzheimer's disease in his mother; Diabetes Mellitus II in his father; Heart attack in his mother; Stroke in his father and mother.     Current Medications   Current Medications    Current Facility-Administered Medications:    acetaminophen  (TYLENOL ) tablet 650 mg, 650 mg, Oral, Q4H PRN **OR** acetaminophen  (TYLENOL ) suppository 650 mg, 650 mg, Rectal, Q4H PRN, Onetha Kuba, MD   alum & mag hydroxide-simeth (MAALOX/MYLANTA) 200-200-20 MG/5ML suspension 30 mL, 30 mL, Oral, Q6H PRN, Onetha Kuba, MD   amiodarone  (PACERONE ) tablet 200 mg, 200 mg, Oral, BID, Colletta Manuelita Garre, PA-C, 200 mg at 09/07/23 1050   bisacodyl  (DULCOLAX) suppository 10 mg, 10 mg, Rectal, Q0600, Claudene Toribio BROCKS, MD, 10 mg at 09/07/23 9357   Chlorhexidine  Gluconate Cloth 2 % PADS 6 each, 6 each, Topical, Daily, Onetha Kuba, MD, 6 each at 09/07/23 1000   cyanocobalamin  (VITAMIN B12) tablet 5,000 mcg, 5,000 mcg, Oral, Q1200, Onetha Kuba, MD, 5,000 mcg at 09/07/23 1104   heparin  ADULT infusion 100 units/mL (25000 units/250mL), 1,100 Units/hr, Intravenous, Continuous, Hammons, Kimberly B, RPH, Last Rate: 11 mL/hr at 09/07/23 0600, 1,100 Units/hr at 09/07/23 0600   HYDROcodone -acetaminophen  (NORCO/VICODIN) 5-325 MG per tablet 2 tablet, 2 tablet, Oral, Q4H PRN, Onetha Kuba, MD, 2 tablet at 09/07/23 1103   HYDROmorphone  (DILAUDID ) injection 0.5 mg, 0.5 mg, Intravenous, Q2H PRN, Onetha Kuba, MD, 0.5 mg at 09/03/23 1927   menthol -cetylpyridinium (CEPACOL) lozenge 3 mg, 1 lozenge, Oral, PRN **OR** phenol (CHLORASEPTIC) mouth spray 1 spray, 1 spray, Mouth/Throat, PRN, Onetha Kuba, MD   methocarbamol  (ROBAXIN ) tablet 500 mg, 500 mg, Oral, QID, Onetha Kuba, MD, 500 mg at 09/07/23 1050   metoprolol  succinate  (TOPROL -XL) 24 hr tablet 12.5 mg, 12.5 mg, Oral, BID, McLean, Dalton S, MD, 12.5 mg at 09/07/23 1050   multivitamin with minerals tablet 1 tablet, 1 tablet, Oral, Daily, Onetha Kuba, MD, 1 tablet at 09/07/23 1050   ondansetron  (ZOFRAN ) tablet 4 mg, 4 mg, Oral, Q6H PRN, 4 mg at 09/05/23 1703 **OR** ondansetron  (ZOFRAN ) injection 4 mg, 4 mg, Intravenous, Q6H PRN, Onetha Kuba, MD   Oral care mouth rinse, 15 mL, Mouth Rinse, PRN, Onetha Kuba, MD   pantoprazole  (PROTONIX ) injection 40 mg, 40 mg, Intravenous, QHS, Onetha Kuba, MD, 40 mg at 09/06/23 2136   polyethylene glycol (MIRALAX  / GLYCOLAX ) packet 17 g, 17 g, Oral, Daily PRN, Onetha Kuba, MD, 17 g at 09/05/23 1703   polyethylene glycol (MIRALAX  / GLYCOLAX ) packet 17 g, 17 g, Oral, BID, Claudene Toribio BROCKS, MD, 17 g at 09/07/23 1106   potassium chloride  SA (KLOR-CON  M) CR tablet 20 mEq, 20 mEq, Oral, Daily, Onetha Kuba, MD, 20 mEq at 09/07/23 1100   rosuvastatin  (CRESTOR ) tablet 10 mg, 10 mg, Oral, Daily, McLean, Dalton S, MD, 10 mg at 09/07/23 1050   sodium chloride  flush (NS) 0.9 % injection 10-40 mL, 10-40 mL, Intracatheter, Q12H, Claudene Toribio BROCKS, MD, 10 mL at 09/07/23 1000   sodium chloride  flush (NS) 0.9 % injection 10-40 mL, 10-40 mL, Intracatheter, PRN, Claudene Toribio BROCKS, MD   sodium chloride  flush (NS) 0.9 % injection 3 mL, 3 mL, Intravenous, Q12H, Onetha Kuba, MD, 3 mL at 09/06/23 2137   sodium chloride  flush (NS) 0.9 % injection 3 mL, 3 mL, Intravenous, PRN, Onetha Kuba, MD   spironolactone  (ALDACTONE )  tablet 12.5 mg, 12.5 mg, Oral, Daily, McLean, Dalton S, MD, 12.5 mg at 09/07/23 1050   tamsulosin  (FLOMAX ) capsule 0.4 mg, 0.4 mg, Oral, Daily, Onetha Kuba, MD, 0.4 mg at 09/07/23 1050   traMADol  (ULTRAM ) tablet 100 mg, 100 mg, Oral, Q8H PRN, Onetha Kuba, MD, 100 mg at 09/06/23 9647     Patients Current Diet:  Diet Order                  Diet regular Room service appropriate? Yes; Fluid consistency: Thin  Diet effective now                          Precautions / Restrictions Precautions Precautions: Back Precaution Booklet Issued: No Precaution/Restrictions Comments: able to state 3/3 precautions Spinal Brace: Lumbar corset Other Brace: AFO RLE Restrictions Weight Bearing Restrictions Per Provider Order: No    Has the patient had 2 or more falls or a fall with injury in the past year? Yes    Prior Activity Level  Pt. Active in the community PTA   Prior Functional Level Prior Function Prior Level of Function : Needs assist Mobility Comments: Ambulating with RW, Fall earlier this year resulting in Rt hip fracture ADLs Comments: Needs assist for LB dressing, wife does IADL   Self Care: Did the patient need help bathing, dressing, using the toilet or eating?  Independent   Indoor Mobility: Did the patient need assistance with walking from room to room (with or without device)? Independent   Stairs: Did the patient need assistance with internal or external stairs (with or without device)? Independent   Functional Cognition: Did the patient need help planning regular tasks such as shopping or remembering to take medications? Independent   Patient Information Are you of Hispanic, Latino/a,or Spanish origin?: A. No, not of Hispanic, Latino/a, or Spanish origin What is your race?: A. White Do you need or want an interpreter to communicate with a doctor or health care staff?: 0. No   Patient's Response To:  Health Literacy and Transportation Is the patient able to respond to health literacy and transportation needs?: Yes Health Literacy - How often do you need to have someone help you when you read instructions, pamphlets, or other written material from your doctor or pharmacy?: Never In the past 12 months, has lack of transportation kept you from medical appointments or from getting medications?: No In the past 12 months, has lack of transportation kept you from meetings, work, or from getting things needed for daily living?:  No   Home Assistive Devices / Equipment Home Equipment: Agricultural consultant (2 wheels), [employer] - single point, Information systems manager, Retail buyer, Rollator (4 wheels)   Prior Device Use: Indicate devices/aids used by the patient prior to current illness, exacerbation or injury? None of the above   Current Functional Level Cognition   Orientation Level: Oriented X4    Extremity Assessment (includes Sensation/Coordination)   Upper Extremity Assessment: Generalized weakness  Lower Extremity Assessment: Defer to PT evaluation     ADLs   Overall ADL's : Needs assistance/impaired Eating/Feeding: Set up Grooming: Set up, Sitting Grooming Details (indicate cue type and reason): limited by A line in dominant hand, asking RN and staff to assist with hand to mouth bc of this Upper Body Bathing: Moderate assistance Lower Body Bathing: Maximal assistance Upper Body Dressing : Maximal assistance Upper Body Dressing Details (indicate cue type and reason): doffing brace Lower Body Dressing: Maximal assistance Lower Body  Dressing Details (indicate cue type and reason): doff socks, unable to perform figure 4 Toilet Transfer: Moderate assistance, Stand-pivot (1 person HHA) Toilet Transfer Details (indicate cue type and reason): face to face simulated through return to bed Toileting- Clothing Manipulation and Hygiene: Maximal assistance, Bed level Functional mobility during ADLs: Moderate assistance (1 person HHA/face to face)     Mobility   Overal bed mobility: Needs Assistance Bed Mobility: Rolling, Sit to Sidelying Rolling: Min assist, Used rails Sidelying to sit: Min assist, Used rails Sit to sidelying: Mod assist, Used rails General bed mobility comments: in recliner when PT entered room     Transfers   Overall transfer level: Needs assistance Equipment used: Standard walker Transfers: Sit to/from Stand Sit to Stand: Mod assist Bed to/from chair/wheelchair/BSC transfer type:: Step pivot Step pivot  transfers: Mod assist General transfer comment: Mod assist for boost to stand with cues for hand placement and hip hinge to maintain spinal precautions. Improved balance in standing without retropulsion today. Pt able to stand for ~ 3 min     Ambulation / Gait / Stairs / Wheelchair Mobility   Ambulation/Gait Ambulation/Gait assistance: Min assist, +2 physical assistance, +2 safety/equipment Gait Distance (Feet): 3 Feet Assistive device: Standard walker Gait Pattern/deviations: Step-through pattern General Gait Details: short partial step through gait pattern with mid foot initial contact. HR intermittently up to 147 bpm therefore limited at this time. Gait velocity: decreased Gait velocity interpretation: <1.31 ft/sec, indicative of household ambulator Pre-gait activities: Engineer, water march, limited, mod assist.     Posture / Balance Dynamic Sitting Balance Sitting balance - Comments: requires UE support in chair Balance Overall balance assessment: Needs assistance Sitting-balance support: Feet supported, Single extremity supported Sitting balance-Leahy Scale: Fair Sitting balance - Comments: requires UE support in chair Standing balance support: Bilateral upper extremity supported, During functional activity, Reliant on assistive device for balance Standing balance-Leahy Scale: Poor     Special considerations/ Life events Special service needs n/a        Previous Home Environment (from acute therapy documentation) Living Arrangements: Spouse/significant other  Lives With: Spouse Available Help at Discharge: Family, Available 24 hours/day Type of Home: House Home Layout: One level Home Access: Stairs to enter Entrance Stairs-Rails: Left, Right Entrance Stairs-Number of Steps: 1 Bathroom Shower/Tub: Health visitor: Standard Bathroom Accessibility: Yes How Accessible: Accessible via walker Home Care Services: No   Discharge Living Setting Plans for Discharge  Living Setting: Patient's home Type of Home at Discharge: House Discharge Home Layout: One level Discharge Home Access: Stairs to enter Entrance Stairs-Rails: Right, Left Entrance Stairs-Number of Steps: 1 Discharge Bathroom Shower/Tub: Walk-in shower Discharge Bathroom Toilet: Standard Discharge Bathroom Accessibility: Yes How Accessible: Accessible via walker   Social/Family/Support Systems Patient Roles: Spouse Contact Information: [phone] Caregiver Availability: 24/7 Discharge Plan Discussed with Primary Caregiver: Yes Is Caregiver In Agreement with Plan?: Yes Does Caregiver/Family have Issues with Lodging/Transportation while Pt is in Rehab?: No     Goals Patient/Family Goal for Rehab: PT/OT Min A- Supervision Expected length of stay: 10-12 days Pt/Family Agrees to Admission and willing to participate: Yes Program Orientation Provided & Reviewed with Pt/Caregiver Including Roles  & Responsibilities: Yes     Decrease burden of Care through IP rehab admission: not anticipated     Possible need for SNF placement upon discharge:not anticipated     Patient Condition: This patient's medical and functional status has changed since the consult dated: 09/04/23 in which the Rehabilitation Physician determined and documented that the  patient's condition is appropriate for intensive rehabilitative care in an inpatient rehabilitation facility. See History of Present Illness (above) for medical update. Functional changes are: Pt.is min A with some transfers and gait. Patient's medical and functional status update has been discussed with the Rehabilitation physician and patient remains appropriate for inpatient rehabilitation. Will admit to inpatient rehab today.   Preadmission Screen Completed By:  Leita KATHEE Kleine, CCC-SLP, 09/07/2023 1:45 PM ______________________________________________________________________   Discussed status with Dr. Urbano on 09/10/23 at 900 and received  approval for admission today.   Admission Coordinator:  Leita KATHEE Kleine, [phone]

## 2023-09-10 NOTE — Progress Notes (Signed)
 Mobility Specialist Progress Note:   09/10/23 1140  Mobility  Activity Pivoted/transferred to/from BSC  Level of Assistance Minimal assist, patient does 75% or more (+2)  Assistive Device Front wheel walker  Distance Ambulated (ft) 3 ft  Activity Response Tolerated well  Mobility Referral Yes  Mobility visit 1 Mobility  Mobility Specialist Start Time (ACUTE ONLY) 1135  Mobility Specialist Stop Time (ACUTE ONLY) 1150  Mobility Specialist Time Calculation (min) (ACUTE ONLY) 15 min   Pt requesting to transfer to [facility]. Required minA+2 to stand and pivot for safety/line management. BM unsuccessful. Back in chair with all needs met.   Therisa Rana Mobility Specialist Please contact via SecureChat or  Rehab office at [phone]

## 2023-09-10 NOTE — Progress Notes (Signed)
 Subjective: Patient reports doing well no new lower extremity symptoms feels overall slightly improved from preop.  Objective: Vital signs in last 24 hours: Temp:  [97.8 F (36.6 C)-98.4 F (36.9 C)] 98.4 F (36.9 C) (09/03 0803) Pulse Rate:  [68-93] 77 (09/03 1019) Resp:  [17-19] 18 (09/03 0310) BP: (109-134)/(46-58) 132/57 (09/03 1019) SpO2:  [92 %-97 %] 93 % (09/03 0826)  Intake/Output from previous day: 09/02 0701 - 09/03 0700 In: -  Out: 2725 [Urine:2725] Intake/Output this shift: Total I/O In: 354.1 [I.V.:354.1] Out: -   Awake and alert strength is baseline right foot drop.  1 out of 5 otherwise seems stable at 5 out of 5 incision clean dry and intact  Lab Results: Recent Labs    09/08/23 0500 09/09/23 0454  WBC 9.4 8.2  HGB 8.1* 7.8*  HCT 25.0* 24.2*  PLT 190 192   BMET Recent Labs    09/08/23 0500 09/09/23 0454  NA 138 137  K 4.3 4.3  CL 101 102  CO2 28 26  GLUCOSE 104* 96  BUN 16 13  CREATININE 1.12 1.00  CALCIUM  8.7* 8.4*    Studies/Results: No results found.  Assessment/Plan: Postop day 7 transpedicular decompression L1 thoracolumbar fusion T10-L4 doing well will make rehab consult agree with inpatient rehab.  Okay to switch to Xarelto  if cardiology deems him ready to do that and stable to switch off the IV heparin .  LOS: 7 days     Aaron Richmond 09/10/2023, 10:40 AM

## 2023-09-10 NOTE — Discharge Summary (Signed)
 Physician Discharge Summary  Patient ID: Aaron Richmond MRN: [id number] DOB/AGE: 77-Sep-1947 77 y.o.  Admit date: 09/10/2023 Discharge date: 09/24/2023  Discharge Diagnoses:  Principal Problem:   Acute incomplete paraplegia [facility]) Active Problems:   Lumbar burst fracture (HCC)   Coping style affecting medical condition DVT prophylaxis Acute blood loss anemia Shock/narrow complex tachycardia with hypotension Chronic systolic congestive heart failure CAD Atrial fibrillation CKD stage III Hyperlipidemia History of prostate cancer Constipation Ascending aortic aneurysm  Discharged Condition: Stable  Significant Diagnostic Studies: MR CARDIAC VELOCITY FLOW MAP Result Date: 09/11/2023 CLINICAL DATA:  Cardiomyopathy of uncertain etiology EXAM: CARDIAC MRI TECHNIQUE: The patient was scanned on a 1.5 Tesla GE magnet. A dedicated cardiac coil was used. Functional imaging was done using Fiesta sequences. 2,3, and 4 chamber views were done to assess for RWMA's. Modified Simpson's rule using a short axis stack was used to calculate an ejection fraction on a dedicated work [employer]. The patient received 10 cc of Gadavist . After 10 minutes inversion recovery sequences were used to assess for infiltration and scar tissue. FINDINGS: Limited images of the lung fields showed no gross abnormalities. 51 mm ascending aorta. The left ventricle is severely dilated with normal wall thickness. Mild-moderate diffuse hypokinesis, LV EF 40%. Normal right ventricular size with RV EF 37%. Severely dilated left atrium, mildly dilated right atrium. The aortic valve is not visualized well enough to comment on number of leaflets. There is severe aortic insufficiency with regurgitant volume 59 mL and regurgitant fraction 50%. No significant mitral regurgitation (regurgitant fraction < 5%). On delayed enhancement imaging, there is a small area of <50% wall thickness subendocardial late gadolinium  enhancement in the basal inferolateral wall. There is a small area of mid-wall LGE in the basal inferoseptal wall at the RV insertion site. MEASUREMENTS: MEASUREMENTS LVEDV 304 mL LVEDVi 157 mL/m2 LVSV 122 mL LVEF 40% RVEDV 93 mL RVEDVi 48 mL/m2 RVSV 35 mL RVEF 37% Aortic forward volume 117 mL Aortic regurgitant volume 59 mL, regurgitant fraction 50% Global T1 1093, ECV 35% IMPRESSION: 1.  Severely dilated ascending aorta and root, 51 mm. 2. Severely dilated left ventricle with normal wall thickness, global hypokinesis with LV EF 40%. 3.  Normal RV size with RV EF 37%. 4. There is severe aortic insufficiency with regurgitant fraction 50% and regurgitant volume 59 mL. Unable to tell cusp number on the aortic valve from this study. 5. Delayed enhancement imaging with a small area of subendocardial LGE in the basal inferolateral wall that looks consistent with small prior MI. There is mid-wall LGE at the inferior RV insertion site that is nonspecific and tends to be related to pressure/volume overload. 6. Elevated extracellular volume percentage at 35% suggests increased myocardial fibrotic content. Severe aortic insufficiency in the setting of dilated ascending aorta and aortic root with LV dilation. [name] Electronically Signed   By: Ezra Shuck M.D.   On: 09/11/2023 17:56   MR CARDIAC VELOCITY FLOW MAP Result Date: 09/11/2023 CLINICAL DATA:  Cardiomyopathy of uncertain etiology EXAM: CARDIAC MRI TECHNIQUE: The patient was scanned on a 1.5 Tesla GE magnet. A dedicated cardiac coil was used. Functional imaging was done using Fiesta sequences. 2,3, and 4 chamber views were done to assess for RWMA's. Modified Simpson's rule using a short axis stack was used to calculate an ejection fraction on a dedicated work [employer] officer, [employer]. The patient received 10 cc of Gadavist . After 10 minutes inversion recovery sequences were used to assess for infiltration and  scar tissue. FINDINGS: Limited images of  the lung fields showed no gross abnormalities. 51 mm ascending aorta. The left ventricle is severely dilated with normal wall thickness. Mild-moderate diffuse hypokinesis, LV EF 40%. Normal right ventricular size with RV EF 37%. Severely dilated left atrium, mildly dilated right atrium. The aortic valve is not visualized well enough to comment on number of leaflets. There is severe aortic insufficiency with regurgitant volume 59 mL and regurgitant fraction 50%. No significant mitral regurgitation (regurgitant fraction < 5%). On delayed enhancement imaging, there is a small area of <50% wall thickness subendocardial late gadolinium enhancement in the basal inferolateral wall. There is a small area of mid-wall LGE in the basal inferoseptal wall at the RV insertion site. MEASUREMENTS: MEASUREMENTS LVEDV 304 mL LVEDVi 157 mL/m2 LVSV 122 mL LVEF 40% RVEDV 93 mL RVEDVi 48 mL/m2 RVSV 35 mL RVEF 37% Aortic forward volume 117 mL Aortic regurgitant volume 59 mL, regurgitant fraction 50% Global T1 1093, ECV 35% IMPRESSION: 1.  Severely dilated ascending aorta and root, 51 mm. 2. Severely dilated left ventricle with normal wall thickness, global hypokinesis with LV EF 40%. 3.  Normal RV size with RV EF 37%. 4. There is severe aortic insufficiency with regurgitant fraction 50% and regurgitant volume 59 mL. Unable to tell cusp number on the aortic valve from this study. 5. Delayed enhancement imaging with a small area of subendocardial LGE in the basal inferolateral wall that looks consistent with small prior MI. There is mid-wall LGE at the inferior RV insertion site that is nonspecific and tends to be related to pressure/volume overload. 6. Elevated extracellular volume percentage at 35% suggests increased myocardial fibrotic content. Severe aortic insufficiency in the setting of dilated ascending aorta and aortic root with LV dilation. [name] Electronically Signed   By: Ezra Shuck M.D.   On: 09/11/2023 17:56   MR  CARDIAC MORPHOLOGY W WO CONTRAST Result Date: 09/11/2023 CLINICAL DATA:  Cardiomyopathy of uncertain etiology EXAM: CARDIAC MRI TECHNIQUE: The patient was scanned on a 1.5 Tesla GE magnet. A dedicated cardiac coil was used. Functional imaging was done using Fiesta sequences. 2,3, and 4 chamber views were done to assess for RWMA's. Modified Simpson's rule using a short axis stack was used to calculate an ejection fraction on a dedicated work [employer] officer, [employer]. The patient received 10 cc of Gadavist . After 10 minutes inversion recovery sequences were used to assess for infiltration and scar tissue. FINDINGS: Limited images of the lung fields showed no gross abnormalities. 51 mm ascending aorta. The left ventricle is severely dilated with normal wall thickness. Mild-moderate diffuse hypokinesis, LV EF 40%. Normal right ventricular size with RV EF 37%. Severely dilated left atrium, mildly dilated right atrium. The aortic valve is not visualized well enough to comment on number of leaflets. There is severe aortic insufficiency with regurgitant volume 59 mL and regurgitant fraction 50%. No significant mitral regurgitation (regurgitant fraction < 5%). On delayed enhancement imaging, there is a small area of <50% wall thickness subendocardial late gadolinium enhancement in the basal inferolateral wall. There is a small area of mid-wall LGE in the basal inferoseptal wall at the RV insertion site. MEASUREMENTS: MEASUREMENTS LVEDV 304 mL LVEDVi 157 mL/m2 LVSV 122 mL LVEF 40% RVEDV 93 mL RVEDVi 48 mL/m2 RVSV 35 mL RVEF 37% Aortic forward volume 117 mL Aortic regurgitant volume 59 mL, regurgitant fraction 50% Global T1 1093, ECV 35% IMPRESSION: 1.  Severely dilated ascending aorta and root, 51 mm. 2. Severely dilated left  ventricle with normal wall thickness, global hypokinesis with LV EF 40%. 3.  Normal RV size with RV EF 37%. 4. There is severe aortic insufficiency with regurgitant fraction 50% and regurgitant  volume 59 mL. Unable to tell cusp number on the aortic valve from this study. 5. Delayed enhancement imaging with a small area of subendocardial LGE in the basal inferolateral wall that looks consistent with small prior MI. There is mid-wall LGE at the inferior RV insertion site that is nonspecific and tends to be related to pressure/volume overload. 6. Elevated extracellular volume percentage at 35% suggests increased myocardial fibrotic content. Severe aortic insufficiency in the setting of dilated ascending aorta and aortic root with LV dilation. [name] Electronically Signed   By: Ezra Shuck M.D.   On: 09/11/2023 17:56   DG Foot 2 Views Right Result Date: 09/10/2023 CLINICAL DATA:  96001 Foot pain 03998 EXAM: RIGHT FOOT - 2 VIEW COMPARISON:  None Available. FINDINGS: Diffuse osteopenia. Moderate hallux valgus deformity.No acute fracture or dislocation. There is no evidence of arthropathy or other focal bone abnormality. Diffuse peripheral vascular atherosclerosis. No radiopaque foreign body. IMPRESSION: Diffuse osteopenia.  No acute fracture or dislocation. Electronically Signed   By: Rogelia Myers M.D.   On: 09/10/2023 16:09   ECHOCARDIOGRAM COMPLETE Result Date: 09/05/2023    ECHOCARDIOGRAM REPORT   Patient Name:   Aaron Richmond Date of Exam: 09/05/2023 Medical Rec #:  [id number]        Height:       71.0 in Accession #:    [id number]       Weight:       172.0 lb Date of Birth:  [birth year]        BSA:          1.977 m Patient Age:    78 years         BP:           97/49 mmHg Patient Gender: M                HR:           94 bpm. Exam Location:  Inpatient Procedure: 2D Echo, Cardiac Doppler and Color Doppler (Both Spectral and Color            Flow Doppler were utilized during procedure). Indications:    CHF  History:        Patient has prior history of Echocardiogram examinations, most                 recent 07/04/2023. Cardiomyopathy, CAD, Aortic Valve Disease,                  Arrythmias:Atrial Fibrillation; Risk Factors:Hypertension.  Sonographer:    Jayson Gaskins Referring Phys: [phone] DALTON S MCLEAN IMPRESSIONS  1. Left ventricular ejection fraction, by estimation, is 30 to 35%. Left ventricular ejection fraction by 2D MOD biplane is 33.0 %. The left ventricle has moderately decreased function. The left ventricle demonstrates global hypokinesis. Left ventricular diastolic function could not be evaluated.  2. Right ventricular systolic function is mildly reduced. The right ventricular size is normal.  3. Left atrial size was moderately dilated.  4. Right atrial size was mildly dilated.  5. The mitral valve is degenerative. Mild mitral valve regurgitation.  6. The aortic valve was not well visualized. Aortic valve regurgitation is moderate.  7. Aortic dilatation noted. There is severe dilatation of the ascending aorta, measuring 56 mm.  8. Rhythm strip during  this exam demonstrates premature ventricular contractions and atrial fibrillation. Comparison(s): Changes from prior study are noted. 07/04/2023: LVEF 35-40%. Conclusion(s)/Recommendation(s): Critical findings reported to Dr. Rolan and acknowledged at 09/05/2023 at 1:05 pm. FINDINGS  Left Ventricle: Left ventricular ejection fraction, by estimation, is 30 to 35%. Left ventricular ejection fraction by 2D MOD biplane is 33.0 %. The left ventricle has moderately decreased function. The left ventricle demonstrates global hypokinesis. The left ventricular internal cavity size was normal in size. There is no left ventricular hypertrophy. Left ventricular diastolic function could not be evaluated due to atrial fibrillation. Left ventricular diastolic function could not be evaluated. Right Ventricle: The right ventricular size is normal. No increase in right ventricular wall thickness. Right ventricular systolic function is mildly reduced. Left Atrium: Left atrial size was moderately dilated. Right Atrium: Right atrial size was mildly dilated.  Pericardium: There is no evidence of pericardial effusion. Mitral Valve: The mitral valve is degenerative in appearance. There is mild calcification of the anterior and posterior mitral valve leaflet(s). Mild mitral valve regurgitation. Tricuspid Valve: The tricuspid valve is grossly normal. Tricuspid valve regurgitation is mild. Aortic Valve: The aortic valve was not well visualized. Aortic valve regurgitation is moderate. Aortic valve mean gradient measures 7.0 mmHg. Aortic valve peak gradient measures 10.6 mmHg. Pulmonic Valve: The pulmonic valve was normal in structure. Pulmonic valve regurgitation is not visualized. Aorta: Aortic dilatation noted. There is severe dilatation of the ascending aorta, measuring 56 mm. IAS/Shunts: No atrial level shunt detected by color flow Doppler. EKG: Rhythm strip during this exam demonstrates premature ventricular contractions and atrial fibrillation.  LEFT VENTRICLE PLAX 2D                        Biplane EF (MOD) LVIDd:         5.00 cm         LV Biplane EF:   Left LVIDs:         4.00 cm                          ventricular LV PW:         0.80 cm                          ejection LV IVS:        0.90 cm                          fraction by                                                 2D MOD                                                 biplane is LV Volumes (MOD)                                33.0 %. LV vol d, MOD    189.4 ml A2C:  Diastology LV vol d, MOD    230.1 ml      LV e' lateral: 11.60 cm/s A4C: LV vol s, MOD    122.7 ml A2C: LV vol s, MOD    163.0 ml A4C: LV SV MOD A2C:   66.7 ml LV SV MOD A4C:   230.1 ml LV SV MOD BP:    69.6 ml RIGHT VENTRICLE RV S prime:     17.30 cm/s LEFT ATRIUM           Index LA Vol (A2C): 68.5 ml 34.64 ml/m LA Vol (A4C): 91.9 ml 46.47 ml/m  AORTIC VALVE AV Vmax:           163.00 cm/s AV Vmean:          128.000 cm/s AV VTI:            0.279 m AV Peak Grad:      10.6 mmHg AV Mean Grad:      7.0 mmHg LVOT Vmax:          132.00 cm/s LVOT Vmean:        106.000 cm/s LVOT VTI:          0.233 m LVOT/AV VTI ratio: 0.84  SHUNTS Systemic VTI: 0.23 m Vinie Maxcy MD Electronically signed by Vinie Maxcy MD Signature Date/Time: 09/05/2023/1:07:50 PM    Final    US  EKG SITE RITE Result Date: 09/05/2023 If Site Rite image not attached, placement could not be confirmed due to current cardiac rhythm.  DG THORACOLUMBAR SPINE Addendum Date: 09/03/2023 ADDENDUM REPORT: 09/03/2023 13:12 ADDENDUM: Addendum created as additional images were provided after initial interpretation. Two additional images demonstrate posterior rods traversing the pedicle screws, extending inferiorly beyond the field of view. Electronically Signed   By: Andrea Gasman M.D.   On: 09/03/2023 13:12   Result Date: 09/03/2023 CLINICAL DATA:  Elective surgery. EXAM: THORACOLUMBAR SPINE 2V COMPARISON:  Radiograph 04/09/2023 FINDINGS: Five fluoroscopic spot views of the thoracolumbar spine submitted from the operating room. Pedicle screws extend from T10 through L3 with remote L1 kyphoplasty. Lower lumbar surgical hardware is partially included in the field of view. Fluoroscopy time 2 minutes 9 seconds. Dose 36.5 mGy. IMPRESSION: Intraoperative fluoroscopy during thoracolumbar fusion. Electronically Signed: By: Andrea Gasman M.D. On: 09/03/2023 12:45   DG C-Arm 1-60 Min-No Report Result Date: 09/03/2023 Fluoroscopy was utilized by the requesting physician.  No radiographic interpretation.   DG C-Arm 1-60 Min-No Report Result Date: 09/03/2023 Fluoroscopy was utilized by the requesting physician.  No radiographic interpretation.   DG C-Arm 1-60 Min-No Report Result Date: 09/03/2023 Fluoroscopy was utilized by the requesting physician.  No radiographic interpretation.    Labs:  Basic Metabolic Panel: Recent Labs  Lab 09/17/23 0725 09/18/23 0439 09/22/23 0549  NA 137 136 139  K 4.5 4.3 4.4  CL 99 101 103  CO2 23 21* 22  GLUCOSE 102* 99 101*   BUN 25* 22 26*  CREATININE 1.39* 1.43* 1.46*  CALCIUM  9.0 9.1 9.3    CBC: Recent Labs  Lab 09/18/23 0439 09/22/23 0549  WBC 9.4 9.0  HGB 9.6* 9.3*  HCT 29.3* 28.9*  MCV 93.6 94.4  PLT 533* 532*    CBG: No results for input(s): GLUCAP in the last 168 hours.  Brief HPI:   Aaron Richmond is a 78 y.o. 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  Per chart review patient lives with spouse.  Ambulates with a rolling walker and right lower extremity AFO.  Presented 09/03/2023 with back and right leg numbness tingling and leg.  Workup revealed severe cord compression at L1 marked spondylosis at that level recommendations of transpedicular decompression of L1 posterior lateral instrumented fusion.  Patient underwent T10-L4 fusion by Dr. Onetha 09/03/2023 with posterior lateral arthrodesis.  Placed in a lumbar corset applied in sitting position.  Hospital course developed narrow complex tachycardia with hypotension, initially failed cardioversion attempts but later converted to atrial fibrillation RVR with 300  J shock with improved pressures followed by cardiology services.  Patient treated with amiodarone  and pressors for hypotension.  Initially Xarelto  held for procedure transition from IV heparin  back to Xarelto  at the recommendations of neurosurgery.  Hospital course acute blood loss anemia 7.8 monitored.  History of right foot drop with right lower extremity AFO.  Reports he has been continent of bowel and bladder.  Therapy evaluations completed due to patient decreased functional mobility was admitted for comprehensive rehab program.   Hospital Course: Aaron Richmond was admitted to rehab  09/10/2023 for inpatient therapies to consist of PT, ST and OT at least three hours five days a week. Past admission physiatrist, therapy team and rehab RN have worked together to provide customized collaborative inpatient rehab.  Pertaining to patient's L1 burst fracture with cord compression status post T10-L4 fusion with transpedicular decompression L1.  Lumbar corset when out of bed applied in sitting position.  Surgical site with some mild erythema and superficial dehiscence and placed on doxycycline  after follow-up per neurosurgery. .  Emotional support follow-up per neuropsychology.  Pain control with the use of tramadol /hydrocodone  as needed as well as Robaxin  for muscle spasms.  History of atrial fibrillation transition from IV heparin  back to chronic Xarelto .  Cardiac rate controlled continue amiodarone  as well as metoprolol  and follow-up cardiology services.  Acute blood loss anemia stable no bleeding episodes.  Chronic systolic congestive heart failure Demadex  and Aldactone  as advised with Farxiga .  Patient exhibiting no signs of fluid overload.  CKD stage III follow-up monitoring of chemistries.  Crestor  ongoing for hyperlipidemia.  History of prostate cancer with Flomax  as indicated monitoring of PVRs.  Bouts of constipation resolved with laxative assistance.   Blood pressures were monitored on TID basis and controlled monitored      Rehab course: During patient's stay in rehab weekly team conferences were held to monitor patient's progress, set goals and discuss barriers to [facility] Information   Document Information  Photos  Back incision  09/16/2023 10:03  Attached To:  Hospital Encounter on 09/10/23  Source Information  Montanti, Almeda CROME, RN  [facility]  scharge. At admission, patient required moderate assist step pivot transfers min mod assist 3 feet with a rolling walker  He/She  has had improvement in activity tolerance, balance, postural control as well as ability  to compensate for deficits. He/She has had improvement in functional use RUE/LUE  and RLE/LLE as well as improvement in awareness.  Patient donned back brace with minimal assist for adjustment.  Perform sit to stand wheelchair bed to rolling walker to wheelchair with supervision.  Ambulates up to 140 feet with rest breaks as needed.  Skilled OT intervention with focus on simulated shower transfers and discharge planning.  Patient able to complete tasks contact-guard minimal assist verbal cues for safety when completing ADL task and  correct angles to take when opening the refrigerator or cabinets.  OT educating patient on kitchen safety while patient completing tasks.  Overall required set up for upper body bathing minimal assist lower body bathing minimal assist with LSO for upper body dressing mod assist lower body dressing.  Full family teaching completed plan discharge to home       Disposition:  Discharge disposition: 01-Home or Self Care        Diet: Regular  Special Instructions: No driving smoking or alcohol   Back corset when out of bed  Medications at discharge. 1.  Tylenol  as needed 2.  Amiodarone  200 mg twice daily 3.  Vitamin B12 5000 mcg daily 4.  Farxiga  10 mg daily 5.  Tramadol  100 mg every 8 hours as needed 6.  Robaxin  500 mg 4 times daily 7.  Toprol -XL 12.5 mg twice daily 8.  Multivitamin daily 9.  Protonix  40 mg daily 10.  MiraLAX  twice daily hold for loose stools 11.  Klor-Con  20 mEq daily 12.  Xarelto  15 mg daily 13.  Crestor  10 mg p.o. daily 14.  Aldactone  25 mg p.o. daily 15.  Flomax  0.4 mg daily 16.  Demadex  20 mg p.o. daily 17.  Dulcolax suppository daily 18.  Doxycycline  100 mg every 12 hours x 3 days  30-35 minutes were spent completing discharge summary and discharge planning  Discharge Instructions     Ambulatory referral to Physical Medicine Rehab   Complete by: As directed    Moderate complexity follow-up 1 to 2 weeks L1 burst fracture/acute  incomplete paraplegic        Follow-up Information     Lovorn, Duwaine, MD Follow up.   Specialty: Physical Medicine and Rehabilitation Why: Office to call for appointment Contact information: [address] [place] [address] [phone]         Fleeta Valeria Mayo, MD Follow up.   Specialty: Internal Medicine Why: Call for appointment Contact information: [address] [place] [address] [phone]         Onetha Kuba, MD Follow up.   Specialty: Neurosurgery Why: Call for appointment Contact information: [address] [place] [address] [phone]         Monetta Redell PARAS, MD Follow up.   Specialties: Cardiology, Radiology Why: Call for appointment Contact information: [address] [place] [address] [phone]         [facility] Follow up on 10/02/2023.   Specialty: Cardiology Why: at 2pm [facility], Entrance C Free Valet Parking Available Contact information: [address] [place]  [address] [phone]                Signed: Toribio PARAS Pitch 09/24/2023, 5:30 AM

## 2023-09-10 NOTE — Progress Notes (Signed)
 Physical Therapy Treatment Patient Details Name: Aaron Richmond MRN: [id number] DOB: 1945-04-26 Today's Date: 09/10/2023   History of Present Illness Pt is a 78 y/o male admitted 09/03/23 for planned transpedicular decompression of L1 instrumented fusion from T10-L4. During Sx pt noted to be in a narrow complex tachycardia and hypotension requiring pressor support and cardioversion x2 and one shock. PMH includes Aneurysm of aortic root, Aortic atherosclerosis, Aortic regurgitation, Aortic valve insufficiency, Ascending aortic aneurysm, Atherosclerotic vascular disease, Atrial fibrillation, CHF, Closed fracture of right hip, Dysrhythmia, HTN, Gastrointestinal hemorrhage, Heart murmur, History of cardiomyopathy, PE, Lumbar radiculopathy, OSA, Prostate cancer metastatic to bone, Rhinitis    PT Comments  Pt making progress towards his goals by progressing his ambulation today. Pt continues to have complaints of R heel pain and there is a blanchable red area visible. Once addressed and AFO donned pt requiring min A for rolling and modAx2 for coming to seated from sidelying. BP 117/69  (83) in seated. Pt requires total A for donning back brace prior to ambulation. Pt initially requires modAx2 for power up to standing. Pt ambulates in room with modAx2 for management of RW and support. Pt tires and reports dizziness after reaching door. Pt provided recliner and has seated rest break. BP 125/41 (66) Pt able to progress ambulation on straight path in hallway with minAx2 after ambulation BP 122 (47) 69 pt reports feeling fine. CIR Admissions coordinator present during session and hopeful for bed today or tomorrow. PT will continue to follow acutely until d/c.    If plan is discharge home, recommend the following: A lot of help with bathing/dressing/bathroom;Assistance with cooking/housework;Assist for transportation;Help with stairs or ramp for entrance;Two people to help with walking and/or transfers   Can travel  by private vehicle        Equipment Recommendations  None recommended by PT (TBD)    Recommendations for Other Services Rehab consult     Precautions / Restrictions Precautions Precautions: Back Precaution Booklet Issued: Yes (comment) Recall of Precautions/Restrictions: Intact Precaution/Restrictions Comments: pt able to maintain back precautions with minor reminders Required Braces or Orthoses: Spinal Brace;Other Brace Spinal Brace: Lumbar corset Other Brace: AFO RLE Restrictions Weight Bearing Restrictions Per Provider Order: No     Mobility  Bed Mobility Overal bed mobility: Needs Assistance Bed Mobility: Rolling, Sidelying to Sit Rolling: Min assist, Used rails, +2 for safety/equipment Sidelying to sit: +2 for physical assistance, Used rails, HOB elevated, Mod assist       General bed mobility comments: vc for sequencing bent knees, and reaching to bedrail. able to come all the way onto his side with min A requires modAx2 for managing LE off bed and trunk to upright    Transfers Overall transfer level: Needs assistance Equipment used: Rolling walker (2 wheels) Transfers: Sit to/from Stand Sit to Stand: Mod assist, +2 safety/equipment, From elevated surface, Min assist           General transfer comment: initial sit>stand from bed requiring modAx2 for power up and steady at [facility], cues for hand placement, with additional power up from recliner minAx2 for power up and steadying    Ambulation/Gait Ambulation/Gait assistance: Min assist, Mod assist, +2 physical assistance, +2 safety/equipment (chair follow) Gait Distance (Feet): 15 Feet (+25) Assistive device: Rolling walker (2 wheels) Gait Pattern/deviations: Decreased stride length, Decreased dorsiflexion - right, Shuffle, Trunk flexed, Step-to pattern Gait velocity: decreased Gait velocity interpretation: <1.31 ft/sec, indicative of household ambulator   General Gait Details: slowed, shuffling gait, constant  cuing for looking up and out, however difficulty with managing RW in room due to many obstacles requires modAx2 for management of RW and outside support, after seated rest break pt able to ambulate 25 feet straight in hallway with min Ax2 and increased cuing for proximity to RW and upright posturn      Balance Overall balance assessment: Needs assistance Sitting-balance support: Bilateral upper extremity supported, Feet supported Sitting balance-Leahy Scale: Poor Sitting balance - Comments: EOB; CGA to minA unsupported   Standing balance support: Bilateral upper extremity supported, During functional activity, Reliant on assistive device for balance Standing balance-Leahy Scale: Poor Standing balance comment: RW and external support                            Communication Communication Communication: Impaired Factors Affecting Communication: Hearing impaired  Cognition Arousal: Alert Behavior During Therapy: WFL for tasks assessed/performed   PT - Cognitive impairments: No apparent impairments, Sequencing                       PT - Cognition Comments: needs mod sequencing cues for UE placement/safety with transfers due to pt bowel urgency, tending to rush a bit. Following commands: Intact      Cueing Cueing Techniques: Verbal cues, Tactile cues, Gestural cues     General Comments General comments (skin integrity, edema, etc.): pt with drop in BP to sitting EoB BP 117/69 (83) with ambulation 125/41 (66) pt with c/o of dizziness after second bout of ambulation BP 122/47 (69) after sitting in chair 5 min 129/76 (94), pt with continued complaints of R heel pain, visualized heel and noted to have blanchable redness and boggy feeling, pt concerned of fx due to increased pain and fall precipitating hospitalization. RN aware and Prevalon boots ordered      Pertinent Vitals/Pain Pain Assessment Pain Assessment: Faces Faces Pain Scale: Hurts little more Pain  Location: back, Rt heel Pain Descriptors / Indicators: Aching, Operative site guarding, Tender Pain Intervention(s): Limited activity within patient's tolerance, Monitored during session, Repositioned     PT Goals (current goals can now be found in the care plan section) Acute Rehab PT Goals Patient Stated Goal: Get well, return home PT Goal Formulation: With patient Time For Goal Achievement: 09/18/23 Potential to Achieve Goals: Good Progress towards PT goals: Progressing toward goals    Frequency    Min 3X/week      PT Plan      Co-evaluation PT/OT/SLP Co-Evaluation/Treatment: Yes Reason for Co-Treatment: For patient/therapist safety;To address functional/ADL transfers PT goals addressed during session: Mobility/safety with mobility;Balance;Proper use of DME;Strengthening/ROM OT goals addressed during session: ADL's and self-care;Strengthening/ROM      AM-PAC PT 6 Clicks Mobility   Outcome Measure  Help needed turning from your back to your side while in a flat bed without using bedrails?: A Little Help needed moving from lying on your back to sitting on the side of a flat bed without using bedrails?: A Lot (w/o rails) Help needed moving to and from a bed to a chair (including a wheelchair)?: A Lot Help needed standing up from a chair using your arms (e.g., wheelchair or bedside chair)?: A Lot Help needed to walk in hospital room?: Total Help needed climbing 3-5 steps with a railing? : Total 6 Click Score: 11    End of Session Equipment Utilized During Treatment: Gait belt;Back brace;Other (comment) (RLE AFO) Activity Tolerance: Patient tolerated treatment well Patient  left: with call bell/phone within reach;in chair;with chair alarm set;with nursing/sitter in room Nurse Communication: Mobility status;Precautions PT Visit Diagnosis: Unsteadiness on feet (R26.81);Other abnormalities of gait and mobility (R26.89);Muscle weakness (generalized) (M62.81);History of  falling (Z91.81);Difficulty in walking, not elsewhere classified (R26.2);Other symptoms and signs involving the nervous system (R29.898);Pain Pain - part of body:  (back, Rt heel)     Time: 8962-8876 PT Time Calculation (min) (ACUTE ONLY): 46 min  Charges:    $Gait Training: 8-22 mins PT General Charges $$ ACUTE PT VISIT: 1 Visit                     Trisha Morandi B. Fleeta Lapidus PT, DPT Acute Rehabilitation Services Please use secure chat or  Call Office [phone]    Almarie KATHEE Fleeta [place] Laura Grant 09/10/2023, 12:33 PM

## 2023-09-10 NOTE — Progress Notes (Addendum)
 Patient ID: Aaron Richmond, male   DOB: 07-13-1945, 78 y.o.   MRN: [id number]     Advanced Heart Failure Rounding Note  Cardiologist: None  HF Consulting MD: Dr. Rolan  Chief Complaint: Chronic CHF Subjective:    CVP 5. 2.7L UOP (I/Os not complete), no daily weight.  Remains with wide PP. SBP in 100-110s.  Sinus rhythm in 70s with frequent PACs and occasional PVCs  Sitting up in bed. Feeling okay. Pain 4/10. Walked with PT yesterday. No SOB, palpitations.  Objective:    Weight Range: 75.2 kg Body mass index is 23.12 kg/m.   Vital Signs:   Temp:  [97.8 F (36.6 C)-98.1 F (36.7 C)] 97.8 F (36.6 C) (09/03 0310) Pulse Rate:  [68-93] 75 (09/03 0310) Resp:  [17-20] 18 (09/03 0310) BP: (109-134)/(36-58) 111/49 (09/03 0310) SpO2:  [92 %-97 %] 95 % (09/03 0310) Last BM Date : 09/08/23 (per last charted)  Weight change: Filed Weights   09/03/23 0601 09/04/23 0555 09/06/23 0652  Weight: 76.2 kg 78 kg 75.2 kg   Intake/Output:  Intake/Output Summary (Last 24 hours) at 09/10/2023 0803 Last data filed at 09/10/2023 0700 Gross per 24 hour  Intake --  Output 2725 ml  Net -2725 ml    Physical Exam    General: Elderly appearing. No distress on RA Cardiac: JVP flat. S1 and S2 present. No murmurs or rub. Resp: Lung sounds clear and equal B/L Extremities: Warm and dry.  No peripheral edema.  Neuro: Alert and oriented x3. Affect pleasant.   Telemetry   SR 70s with frequent PACs and occasional PVCs (personally reviewed)  Labs    CBC Recent Labs    09/08/23 0500 09/09/23 0454  WBC 9.4 8.2  HGB 8.1* 7.8*  HCT 25.0* 24.2*  MCV 96.2 94.9  PLT 190 192   Basic Metabolic Panel Recent Labs    90/98/74 0500 09/09/23 0454  NA 138 137  K 4.3 4.3  CL 101 102  CO2 28 26  GLUCOSE 104* 96  BUN 16 13  CREATININE 1.12 1.00  CALCIUM  8.7* 8.4*  MG 2.2 2.2  PHOS 2.2* 2.3*   Liver Function Tests Recent Labs    09/09/23 0454  AST 29  ALT 10  ALKPHOS 61  BILITOT 0.6   PROT 5.2*  ALBUMIN  2.6*   ProBNP (last 3 results) Recent Labs    11/08/22 1623  PROBNP 1,382*   Medications:    Scheduled Medications:  amiodarone   200 mg Oral BID   bisacodyl   10 mg Rectal Daily   Chlorhexidine  Gluconate Cloth  6 each Topical Daily   cyanocobalamin   5,000 mcg Oral Q1200   methocarbamol   500 mg Oral QID   metoprolol  succinate  12.5 mg Oral BID   multivitamin with minerals  1 tablet Oral Daily   pantoprazole  (PROTONIX ) IV  40 mg Intravenous QHS   polyethylene glycol  17 g Oral BID   potassium chloride  SA  20 mEq Oral Daily   rosuvastatin   10 mg Oral Daily   sodium chloride  flush  10-40 mL Intracatheter Q12H   sodium chloride  flush  3 mL Intravenous Q12H   spironolactone   25 mg Oral Daily   tamsulosin   0.4 mg Oral Daily   torsemide   20 mg Oral Daily    Infusions:  heparin  1,450 Units/hr (09/09/23 1811)    PRN Medications: acetaminophen  **OR** acetaminophen , alum & mag hydroxide-simeth, HYDROcodone -acetaminophen , HYDROmorphone  (DILAUDID ) injection, menthol -cetylpyridinium **OR** phenol, ondansetron  **OR** ondansetron  (ZOFRAN ) IV, mouth rinse, polyethylene glycol,  sodium chloride  flush, sodium chloride  flush, traMADol   Assessment/Plan   1. Shock: Resolved. Patient was on NE x 3 days post-op.  He has a wide pulse pressure likely due to his moderate AI.  BP is very stable off NE, SBP 100-110s.  Echo is stable compared to prior, EF 30-35%, mild RV dysfunction, ascending aorta 56 mm with moderate AI.  Prior echo similar with EF 35-40%, moderate AI.  - Would aim for systolic BP >/= 100 rather than using MAP.   2. Chronic systolic CHF: Nonischemic CMP.  Echo showed EF 30-35%, mild RV dysfunction, ascending aorta 56 mm with moderate AI. Nonischemic cardiomyopathy, coronary CTA in 7/25 showed extensive but nonobstructive CAD.  Cardiomyopathy may be due to permanent AF, ?elevated HR at times. CVP 8.  - CVP 5-6. Continue torsemide  20 mg daily - Would like to keep HR  better-controlled given concern for tachy-mediated CMP. In NSR with frequent PACs - Continue Toprol  XL 12.5 mg BID.  - Continue spironolactone  to 25 mg daily.   - Restart Farxiga  10 mg daily - GDMT limited by orthostasis - Would like him to eventually get a cardiac MRI, with back operation he does not think he could lie flat for a prolonged period of time in the MRI scanner at this point.   3. Atrial fibrillation: Though to be permanent.  Did not cardiovert to NSR when shocked for SVT. ECG 9/2 showed NSR with PACs and PVCs. Remains in this rhythm this morning.  - Continue amiodarone  200 mg bid.  - Continue Toprol  XL.   - Will touch base with NSx about switching heparin  to Xarelto  today 4. SVT: Patient had rapid SVT post-op, required DCCV.  Now on amiodarone .  - Would continue amiodarone  200 mg bid for now.  5. CAD: Nonobstructive but extensive CAD on coronary CTA.  - Continue statin.  - No ASA with anticoagulation.  6. AKI: Resolved. Creatinine up to 1.6 initially.  Now down to 1.0. 7. Dilated aortic root/ascending aorta with moderate AI: 56 mm ascending aorta on echo, this is roughly stable from last CTA.   - Close followup with TCTS as outpatient.  - Moderate AI is likely causing the wide pulse pressure.  8. S/p decompression L1, fusion T10-L4: Per neurosurgery.  Needs PT.  - Graded compression stockings to help with orthostasis.   PT recommending acute rehab.   Length of Stay: 7  [place] Lee, NP  09/10/2023, 8:03 AM  Advanced Heart Failure Team Pager [phone] (M-F; 7a - 5p)  Please contact [facility] for night-coverage after hours (5p -7a ) and weekends on [email or web address]  Patient seen with NP, I formulated the plan and agree with the above note.   He is working with PT today.  No dyspnea.    He remains in NSR on telemetry.  CVP 5-6, co-ox 84%.   General: NAD Neck: No JVD, no thyromegaly or thyroid  nodule.  Lungs: Clear to auscultation bilaterally with normal respiratory  effort. CV: Nondisplaced PMI.  Heart regular S1/S2, no S3/S4, 1/6 SEM RUSB.  No peripheral edema.    Abdomen: Soft, nontender, no hepatosplenomegaly, no distention.  Skin: Intact without lesions or rashes.  Neurologic: Alert and oriented x 3.  Psych: Normal affect. Extremities: No clubbing or cyanosis.  HEENT: Normal.   Volume status looks good on torsemide  20 mg daily.  Will add back Farxiga  10 daily today.  Continue Toprol  XL and spironolactone .   He has gone back into NSR this hospitalization on amiodarone .  -  Continue amiodarone .  - Transition from heparin  gtt to Xarelto  today.  - Echo in a couple of months to see if EF improves in NSR.   Ezra Shuck 09/10/2023 11:15 AM

## 2023-09-10 NOTE — TOC Transition Note (Signed)
 Transition of Care [facility]) - Discharge Note   Patient Details  Name: Aaron Richmond MRN: [id number] Date of Birth: 07-Oct-1945  [facility] CM/SW Contact:  Sudie Erminio Deems, RN Phone Number: 09/10/2023, 2:38 PM   Clinical Narrative: Case Manager received notification that the patient will transition to CIR today. No further needs identified at this time.   Final next level of care: IP Rehab Facility Barriers to Discharge: No Barriers Identified   Patient Goals and CMS Choice Patient states their goals for this hospitalization and ongoing recovery are:: wants to feel better CMS Medicare.gov Compare Post Acute Care list provided to:: Patient Choice offered to / list presented to : Patient   Discharge Plan and Services Additional resources added to the After Visit Summary for     Discharge Planning Services: CM Consult Post Acute Care Choice: Home Health           Social Drivers of Health (SDOH) Interventions SDOH Screenings   Food Insecurity: No Food Insecurity (09/03/2023)  Housing: Unknown (09/03/2023)  Transportation Needs: No Transportation Needs (09/03/2023)  Utilities: Not At Risk (09/03/2023)  Depression (PHQ2-9): Low Risk  (03/10/2023)  Social Connections: Socially Integrated (09/03/2023)  Tobacco Use: Low Risk  (09/03/2023)    Readmission Risk Interventions     No data to display

## 2023-09-10 NOTE — Hospital Course (Signed)
 78 yo male with past medical history of atrial fibrillation, heart failure with reduced ejection fraction, thoracic aortic aneurysm, CAD had symptoms of right lower extremity numbness tingling and back pain and was initially admitted to neurosurgery service on 09/03/2023 for  transpedicular decompression L1, instrumented fusion T10-L4 for severe L1 cord compression / burst fracture.  Postoperative bili patient had significant unstable SVT.  Cardiology was consulted and patient Failed cardioversion x2 at 200J, failed adenosine 6 & 12. Failed add'l 200J CV attempt, and converted to Afib RVR with 360J CV, with improvement in blood pressures.      L1 burst fracture with cord compression - Status post thoracolumbar fusion T10-L4.  Neurosurgery following.  PT has seen the patient and plan for inpatient rehab.  Postop SVT(unstable), paroxysmal atrial fibrillation Cardiology on board.  Failed cardioversion attempts.  Subsequently had fibrillation.  On anticoagulation with IV heparin ..  Heparin  okay with neurosurgery but holding on longer acting anticoagulation at this time..  Continue amiodarone  metoprolol .   Chronic systolic CHF Continue Toprol  XL, Aldactone  -Heart failure team following -GDMT limited due to orthostasis

## 2023-09-10 NOTE — Progress Notes (Signed)
 Occupational Therapy Treatment Patient Details Name: Aaron Richmond MRN: [id number] DOB: April 06, 1945 Today's Date: 09/10/2023   History of present illness Pt is a 78 y/o male admitted 09/03/23 for planned transpedicular decompression of L1 instrumented fusion from T10-L4. During Sx pt noted to be in a narrow complex tachycardia and hypotension requiring pressor support and cardioversion x2 and one shock. PMH includes Aneurysm of aortic root, Aortic atherosclerosis, Aortic regurgitation, Aortic valve insufficiency, Ascending aortic aneurysm, Atherosclerotic vascular disease, Atrial fibrillation, CHF, Closed fracture of right hip, Dysrhythmia, HTN, Gastrointestinal hemorrhage, Heart murmur, History of cardiomyopathy, PE, Lumbar radiculopathy, OSA, Prostate cancer metastatic to bone, Rhinitis   OT comments  Pt noted to have some cognitive deficits and BP decreased with transfer. Pt does not report any symptoms or awareness to drop MAP 83 to MAP 66. Pt is high risk for falls. Pt reports pain in R heel and asking for an xray. RN present and aware. Recommendation for continued acute OT services.       If plan is discharge home, recommend the following:  Two people to help with walking and/or transfers;A lot of help with bathing/dressing/bathroom;Assistance with cooking/housework;Assist for transportation;Help with stairs or ramp for entrance   Equipment Recommendations  Other (comment)    Recommendations for Other Services      Precautions / Restrictions Precautions Precautions: Back Precaution Booklet Issued: Yes (comment) Recall of Precautions/Restrictions: Intact Precaution/Restrictions Comments: pt able to maintain back precautions with minor reminders Required Braces or Orthoses: Spinal Brace;Other Brace Spinal Brace: Lumbar corset Other Brace: AFO RLE Restrictions Weight Bearing Restrictions Per Provider Order: No       Mobility Bed Mobility Overal bed mobility: Needs  Assistance Bed Mobility: Rolling, Sidelying to Sit Rolling: Min assist, Used rails, +2 for safety/equipment Sidelying to sit: +2 for physical assistance, Used rails, HOB elevated, Mod assist       General bed mobility comments: vc for sequencing bent knees, and reaching to bedrail. able to come all the way onto his side with min A requires modAx2 for managing LE off bed and trunk to upright    Transfers Overall transfer level: Needs assistance Equipment used: Rolling walker (2 wheels) Transfers: Sit to/from Stand Sit to Stand: Mod assist, +2 safety/equipment, From elevated surface, Min assist           General transfer comment: initial sit>stand from bed requiring modAx2 for power up and steady at EoB, cues for hand placement, with additional power up from recliner minAx2 for power up and steadying     Balance Overall balance assessment: Needs assistance Sitting-balance support: Bilateral upper extremity supported, Feet supported Sitting balance-Leahy Scale: Poor Sitting balance - Comments: EOB; CGA to minA unsupported   Standing balance support: Bilateral upper extremity supported, During functional activity, Reliant on assistive device for balance Standing balance-Leahy Scale: Poor Standing balance comment: RW and external support                           ADL either performed or assessed with clinical judgement   ADL Overall ADL's : Needs assistance/impaired                     Lower Body Dressing: Maximal assistance Lower Body Dressing Details (indicate cue type and reason): don ted hose, shoe and afo supine Toilet Transfer: Minimal assistance;+2 for physical assistance;+2 for safety/equipment             General ADL Comments: pt agreeable to  session and no c/o of dizziness with decreased BP changes    Extremity/Trunk Assessment Upper Extremity Assessment Upper Extremity Assessment: Generalized weakness   Lower Extremity Assessment Lower  Extremity Assessment: Defer to PT evaluation        Vision       Perception     Praxis     Communication Communication Communication: Impaired Factors Affecting Communication: Hearing impaired   Cognition Arousal: Alert Behavior During Therapy: WFL for tasks assessed/performed Cognition: Cognition impaired             OT - Cognition Comments: pt needs redirection at times. pt with poor recall of information from prior sessions                 Following commands: Intact        Cueing   Cueing Techniques: Verbal cues, Tactile cues, Gestural cues  Exercises      Shoulder Instructions       General Comments pt with BP MAP 83 initially and with short transfer noted to have decreased BP MAP 66 and not symptoms per patient report. Pt visually noted to close eyes and look toward feet. the machine was delayed in starting the BP cuff cycle so the reading was delayed with a reading of BP MAP 66    Pertinent Vitals/ Pain       Pain Assessment Pain Assessment: Faces Faces Pain Scale: Hurts little more Pain Location: back, Rt heel Pain Descriptors / Indicators: Aching, Operative site guarding, Tender Pain Intervention(s): Repositioned, Premedicated before session, Monitored during session, Limited activity within patient's tolerance  Home Living                                          Prior Functioning/Environment              Frequency  Min 2X/week        Progress Toward Goals  OT Goals(current goals can now be found in the care plan section)  Progress towards OT goals: Progressing toward goals  Acute Rehab OT Goals Patient Stated Goal: to get home OT Goal Formulation: With patient Time For Goal Achievement: 09/18/23 Potential to Achieve Goals: Good ADL Goals Pt Will Perform Grooming: sitting;with modified independence Pt Will Perform Upper Body Dressing: with supervision;sitting Pt Will Perform Lower Body Dressing: with min  assist;sit to/from stand;with adaptive equipment;with caregiver independent in assisting Pt Will Transfer to Toilet: with contact guard assist;ambulating Pt Will Perform Toileting - Clothing Manipulation and hygiene: with contact guard assist;sitting/lateral leans;with caregiver independent in assisting Additional ADL Goal #1: Pt will verbalize and maintain back precautions during ADL routine with no cues  Plan      Co-evaluation    PT/OT/SLP Co-Evaluation/Treatment: Yes Reason for Co-Treatment: For patient/therapist safety;To address functional/ADL transfers PT goals addressed during session: Mobility/safety with mobility;Balance;Proper use of DME;Strengthening/ROM OT goals addressed during session: ADL's and self-care;Strengthening/ROM      AM-PAC OT 6 Clicks Daily Activity     Outcome Measure   Help from another person eating meals?: None Help from another person taking care of personal grooming?: A Little Help from another person toileting, which includes using toliet, bedpan, or urinal?: A Lot Help from another person bathing (including washing, rinsing, drying)?: A Lot Help from another person to put on and taking off regular upper body clothing?: A Lot Help from another person to put on  and taking off regular lower body clothing?: A Lot 6 Click Score: 15    End of Session Equipment Utilized During Treatment: Gait belt;Rolling walker (2 wheels)  OT Visit Diagnosis: Unsteadiness on feet (R26.81);Other abnormalities of gait and mobility (R26.89);Muscle weakness (generalized) (M62.81);History of falling (Z91.81);Other symptoms and signs involving the nervous system (R29.898);Pain   Activity Tolerance Patient tolerated treatment well   Patient Left with call bell/phone within reach;in chair;with chair alarm set   Nurse Communication Mobility status;Precautions        Time: 1037 [phone] OT Time Calculation (min): 47 min  Charges: OT General Charges $OT Visit: 1  Visit OT Treatments $Self Care/Home Management : 23-37 mins   Brynn, OTR/L  Acute Rehabilitation Services Office: [phone] .   Ely Molt 09/10/2023, 2:43 PM

## 2023-09-10 NOTE — Discharge Summary (Signed)
 Physician Discharge Summary  Aaron Richmond FMW:[id number] DOB: Dec 21, 1945 DOA: 09/03/2023  PCP: Fleeta Valeria Mayo, MD  Admit date: 09/03/2023 Discharge date: 09/10/2023  Admitted From: Home  Discharge disposition: CIR   Recommendations for Outpatient Follow-Up:   Follow up with your primary care provider in one week.  Check CBC, BMP, magnesium  in the next visit Follow-up with neurology and cardiology as outpatient.   Discharge Diagnosis:   Principal Problem:   Lumbar burst fracture (HCC) Active Problems:   Atrial fibrillation (HCC)   Ascending aortic aneurysm (HCC)   Coronary artery calcification seen on CT scan   Hyperlipidemia   Malignant neoplasm of prostate (HCC)   Obesity (BMI 30-39.9)   BPH (benign prostatic hyperplasia)   CKD (chronic kidney disease) stage 2, GFR 60-89 ml/min   Prostate cancer metastatic to bone [facility])   Essential hypertension   OSA (obstructive sleep apnea)   Shock (HCC)   Discharge Condition: Improved.  Diet recommendation: .  Regular.  Wound care: None.  Code status: Full.   History of Present Illness:   78 yo male with past medical history of atrial fibrillation, heart failure with reduced ejection fraction, thoracic aortic aneurysm, CAD had symptoms of right lower extremity numbness tingling and back pain and was initially admitted to neurosurgery service on 09/03/2023 for  transpedicular decompression L1, instrumented fusion T10-L4 for severe L1 cord compression / burst fracture.  Postoperative bili patient had significant unstable SVT.  Cardiology was consulted and patient Failed cardioversion x2 at 200J, failed adenosine 6 & 12. Failed add'l 200J CV attempt, and converted to Afib RVR with 360J CV, with improvement in blood pressures.  At this time patient has remained stable and awaiting for CIR.   Hospital Course:   Following conditions were addressed during hospitalization as listed below,  Traumatic L1 burst fracture with cord  compression - Status post thoracolumbar fusion T10-L4.  Neurosurgery following.  PT has seen the patient and plan for inpatient rehab.   Postop SVT(unstable), paroxysmal atrial fibrillation Cardiology followed the patient during hospitalization.  Failed cardioversion attempts.  Subsequently had fibrillation.  On anticoagulation with IV heparin ..  Discussed with Dr. Onetha today and he is okay with oral anticoagulants at this time.  Continue amiodarone  metoprolol .  Has been started on Xarelto .  Normal sinus rhythm in telemetry.   Chronic systolic CHF Continue Toprol  XL, Aldactone .  Heart failure team following.  Volume status good on torsemide  20.  Farxiga  10 mg to be added today.  Will need 2D echocardiogram in few months to see if improvement in EF.  Disposition.  At this time, patient is stable for disposition to CIR  Medical Consultants:   Neurosurgery Cardiology  Procedures:    DC cardioversion Thoracolumbar fusion T10-L4. Subjective:   Today, patient was seen and examined at bedside.  Patient denies any chest pain/shortness of breath dyspnea dizziness lightheadedness or fever.  Sitting up in the bedside chair.  No shortness of breath or dyspnea.  Worked with physical therapy yesterday.  Has some pain on the surgical site.   Discharge Exam:   Vitals:   09/10/23 1019 09/10/23 1251  BP: (!) 132/57 (!) 130/57  Pulse: 77 83  Resp:    Temp:  98.2 F (36.8 C)  SpO2: 95% 94%   Vitals:   09/10/23 0803 09/10/23 0826 09/10/23 1019 09/10/23 1251  BP: (!) 113/47  (!) 132/57 (!) 130/57  Pulse: 79 72 77 83  Resp:      Temp: 98.4 F (36.9  C)   98.2 F (36.8 C)  TempSrc: Oral   Oral  SpO2: 95% 93% 95% 94%  Weight:      Height:       GENERAL: Patient is alert awake and oriented. Not in obvious distress.,  Elderly male, HENT: No scleral pallor or icterus. Pupils equally reactive to light. Oral mucosa is moist NECK: is supple, no gross swelling noted. CHEST: Clear to auscultation.  No crackles or wheezes.  Diminished breath sounds bilaterally. CVS: S1 and S2 heard, no murmur. Regular rate and rhythm.  ABDOMEN: Soft, non-tender, bowel sounds are present. EXTREMITIES: No edema.  Right foot drop. CNS: Cranial nerves are intact.  Moves lower extremities. SKIN: warm and dry without rashes.  The results of significant diagnostics from this hospitalization (including imaging, microbiology, ancillary and laboratory) are listed below for reference.     Diagnostic Studies:   DG THORACOLUMBAR SPINE Addendum Date: 09/03/2023 ADDENDUM REPORT: 09/03/2023 13:12 ADDENDUM: Addendum created as additional images were provided after initial interpretation. Two additional images demonstrate posterior rods traversing the pedicle screws, extending inferiorly beyond the field of view. Electronically Signed   By: Andrea Gasman M.D.   On: 09/03/2023 13:12   Result Date: 09/03/2023 CLINICAL DATA:  Elective surgery. EXAM: THORACOLUMBAR SPINE 2V COMPARISON:  Radiograph 04/09/2023 FINDINGS: Five fluoroscopic spot views of the thoracolumbar spine submitted from the operating room. Pedicle screws extend from T10 through L3 with remote L1 kyphoplasty. Lower lumbar surgical hardware is partially included in the field of view. Fluoroscopy time 2 minutes 9 seconds. Dose 36.5 mGy. IMPRESSION: Intraoperative fluoroscopy during thoracolumbar fusion. Electronically Signed: By: Andrea Gasman M.D. On: 09/03/2023 12:45   DG C-Arm 1-60 Min-No Report Result Date: 09/03/2023 Fluoroscopy was utilized by the requesting physician.  No radiographic interpretation.   DG C-Arm 1-60 Min-No Report Result Date: 09/03/2023 Fluoroscopy was utilized by the requesting physician.  No radiographic interpretation.   DG C-Arm 1-60 Min-No Report Result Date: 09/03/2023 Fluoroscopy was utilized by the requesting physician.  No radiographic interpretation.     Labs:   Basic Metabolic Panel: Recent Labs  Lab  09/05/23 0312 09/06/23 0500 09/06/23 0512 09/07/23 0500 09/08/23 0500 09/09/23 0454  NA 134* 135  --  136 138 137  K 3.9 3.4*  --  3.5 4.3 4.3  CL 96* 95*  --  100 101 102  CO2 26 28  --  29 28 26   GLUCOSE 142* 108*  --  95 104* 96  BUN 22 22  --  21 16 13   CREATININE 1.65* 1.31*  --  1.26* 1.12 1.00  CALCIUM  8.3* 8.6*  --  8.7* 8.7* 8.4*  MG 2.4  --  2.2 2.1 2.2 2.2  PHOS  --   --   --  2.4* 2.2* 2.3*   GFR Estimated Creatinine Clearance: 65.8 mL/min (by C-G formula based on SCr of 1 mg/dL). Liver Function Tests: Recent Labs  Lab 09/09/23 0454  AST 29  ALT 10  ALKPHOS 61  BILITOT 0.6  PROT 5.2*  ALBUMIN  2.6*   No results for input(s): LIPASE, AMYLASE in the last 168 hours. No results for input(s): AMMONIA in the last 168 hours. Coagulation profile No results for input(s): INR, PROTIME in the last 168 hours.  CBC: Recent Labs  Lab 09/06/23 1008 09/07/23 0500 09/08/23 0500 09/09/23 0454 09/10/23 1030  WBC 10.2 9.2 9.4 8.2 10.2  HGB 9.2* 8.0* 8.1* 7.8* 8.7*  HCT 27.7* 24.8* 25.0* 24.2* 26.6*  MCV 93.6 95.0 96.2  94.9 94.3  PLT 161 162 190 192 252   Cardiac Enzymes: No results for input(s): CKTOTAL, CKMB, CKMBINDEX, TROPONINI in the last 168 hours. BNP: Invalid input(s): POCBNP CBG: No results for input(s): GLUCAP in the last 168 hours. D-Dimer No results for input(s): DDIMER in the last 72 hours. Hgb A1c No results for input(s): HGBA1C in the last 72 hours. Lipid Profile No results for input(s): CHOL, HDL, LDLCALC, TRIG, CHOLHDL, LDLDIRECT in the last 72 hours. Thyroid  function studies No results for input(s): TSH, T4TOTAL, T3FREE, THYROIDAB in the last 72 hours.  Invalid input(s): FREET3 Anemia work up No results for input(s): VITAMINB12, FOLATE, FERRITIN, TIBC, IRON, RETICCTPCT in the last 72 hours. Microbiology No results found for this or any previous visit (from the past 240  hours).   Discharge Instructions:   Discharge Instructions     Diet general   Complete by: As directed    Discharge instructions   Complete by: As directed    Follow-up with your primary care provider after discharge.  Continue rehabilitation.  Follow-up with neurosurgery and cardiology after discharge.  Seek medical attention for worsening symptoms.   Increase activity slowly   Complete by: As directed    No wound care   Complete by: As directed       Allergies as of 09/10/2023       Reactions   Cipro [ciprofloxacin Hcl] Other (See Comments)   Increased risk of rupture  of ascending thoracic aortic aneurysm        Medication List     STOP taking these medications    METHYL B-12 PO   metoprolol  tartrate 50 MG tablet Commonly known as: LOPRESSOR    sacubitril -valsartan  24-26 MG Commonly known as: Entresto        TAKE these medications    acetaminophen  500 MG tablet Commonly known as: TYLENOL  Take 1,000 mg by mouth 2 (two) times daily.   amiodarone  200 MG tablet Commonly known as: PACERONE  Take 1 tablet (200 mg total) by mouth 2 (two) times daily.   B-12 5000 MCG Caps Take 1 capsule by mouth daily at 12 noon.   bisacodyl  10 MG suppository Commonly known as: DULCOLAX Place 1 suppository (10 mg total) rectally daily. Start taking on: September 11, 2023   dapagliflozin  propanediol 10 MG Tabs tablet Commonly known as: FARXIGA  Take 1 tablet (10 mg total) by mouth daily. Start taking on: September 11, 2023 What changed:  medication strength how much to take   HYDROcodone -acetaminophen  5-325 MG tablet Commonly known as: NORCO/VICODIN Take 2 tablets by mouth every 4 (four) hours as needed for severe pain (pain score 7-10).   Klor-Con  M20 20 MEQ tablet Generic drug: potassium chloride  SA Take 20 mEq by mouth daily in the afternoon. What changed: Another medication with the same name was changed. Make sure you understand how and when to take each.    potassium chloride  SA 20 MEQ tablet Commonly known as: Klor-Con  M20 Take 1 tablet (20 mEq total) by mouth daily. Start taking on: September 11, 2023 What changed: when to take this   Mens 50+ Multivitamin Tabs Take 1 tablet by mouth every evening.   methocarbamol  500 MG tablet Commonly known as: ROBAXIN  Take 500 mg by mouth every 6 (six) hours as needed for muscle spasms.   metoprolol  succinate 25 MG 24 hr tablet Commonly known as: TOPROL -XL Take 0.5 tablets (12.5 mg total) by mouth 2 (two) times daily.   ondansetron  4 MG tablet Commonly known as: ZOFRAN  Take  1 tablet (4 mg total) by mouth every 6 (six) hours as needed for nausea or vomiting.   polyethylene glycol 17 g packet Commonly known as: MIRALAX  / GLYCOLAX  Take 17 g by mouth daily as needed for mild constipation.   rivaroxaban  20 MG Tabs tablet Commonly known as: Xarelto  Take 1 tablet (20 mg total) by mouth daily with supper.   rosuvastatin  5 MG tablet Commonly known as: CRESTOR  Take 1 tablet by mouth once daily   spironolactone  25 MG tablet Commonly known as: ALDACTONE  Take 1 tablet (25 mg total) by mouth daily. Start taking on: September 11, 2023   tamsulosin  0.4 MG Caps capsule Commonly known as: FLOMAX  TAKE 1 CAPSULE BY MOUTH TWICE DAILY 30 MINUTES FOLLOWING  THE  SAME  MEAL  EACH  DAY   torsemide  20 MG tablet Commonly known as: DEMADEX  Take 1 tablet (20 mg total) by mouth daily. Start taking on: September 11, 2023 What changed: when to take this   traMADol  50 MG tablet Commonly known as: ULTRAM  Take 2 tablets (100 mg total) by mouth every 8 (eight) hours as needed.        Follow-up Information     Fleeta Valeria Mayo, MD Follow up.   Specialty: Internal Medicine Contact information: [address] [place] [address] [phone]         Onetha Kuba, MD Follow up.   Specialty: Neurosurgery Contact information: [address] [place] [address] [phone]                   Time coordinating discharge: 39 minutes  Signed:  Chrisanne Loose  [facility] 09/10/2023, 2:52 PM

## 2023-09-10 NOTE — H&P (Signed)
 Physical Medicine and Rehabilitation Admission H&P     HPI: Aaron Richmond. 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  Per chart review patient lives with spouse.  1 level home one-step to entry.  Ambulates with a rolling walker and right lower extremity AFO.  Presented 09/03/2023 with back and right leg pain numbness tingling in her legs.  Workup revealed severe cord compression at L1 marked spondylosis at that level recommendations of transpedicular decompression of L1 and posterior lateral instrumented fusion.  Patient underwent T10-L4 fusion by Dr. Onetha 09/03/2023 with posterior lateral arthrodesis T10-11, T11-12, T12-L1, L1-L2, L2-L3, L3-L4.  Placed in a lumbar corset applied in sitting position.  Hospital course patient developed narrow complex tachycardia with hypotension, initially failed cardioversion attempts but later converted to A-fib RVR with 360 J shock with improved pressures followed by cardiology services.  Patient treated with amiodarone  and pressors for hypotension.  Initially Xarelto  held for procedure patient currently transition to IV heparin  awaiting plan to resume Xarelto .  Hospital course acute blood loss anemia 7.8 and monitored.  Hx of foot drop RLE, has AFO. Reports had recent EMG/NCS outpatient.  Reports he has been continent of bowel and bladder.  Therapy evaluations completed due to patient's decreased functional mobility was admitted for a comprehensive rehab program.  Review of Systems  Constitutional:  Negative for chills and fever.  HENT:  Positive for hearing loss.   Eyes:  Negative for blurred vision and double vision.  Respiratory:  Negative for  cough, shortness of breath and wheezing.   Cardiovascular:  Positive for palpitations and leg swelling. Negative for chest pain.  Gastrointestinal:  Positive for constipation. Negative for abdominal pain, heartburn, nausea and vomiting.  Genitourinary:  Positive for urgency. Negative for dysuria, flank pain and hematuria.  Musculoskeletal:  Positive for back pain, joint pain and myalgias.  Skin:  Negative for rash.  Neurological:  Positive for dizziness, tingling, sensory change and weakness. Negative for headaches.       Right chronic foot drop  Psychiatric/Behavioral:  The patient has insomnia.   All other systems reviewed and are negative.  Past Medical History:  Diagnosis Date   Aneurysm of aortic root    Overview:  Last Assessment & Plan:  Planning to see Dr Army for evaluation of 5cm aneurysm.  - will check PFT in prep for possible procedure / SGY   Aortic atherosclerosis (HCC) 06/14/2022   Aortic regurgitation    Aortic valve insufficiency    Ascending aortic aneurysm (HCC) 09/11/2016   Atherosclerotic vascular disease 05/27/2022   Atrial fibrillation (HCC)    Atrial fibrillation with RVR (HCC) 07/05/2022   BMI 27.0-27.9,adult 06/14/2022   BPH (benign prostatic hyperplasia)    BRBPR (bright red blood per rectum) 05/20/2023   Cardiomyopathy (HCC) 09/11/2016   CHF (congestive heart failure) (HCC)    CKD (chronic kidney disease) stage 2, GFR 60-89 ml/min 11/23/2021   Closed fracture of right hip (HCC) 03/10/2023   Coronary artery calcification seen on CT scan 10/13/2017   Dysrhythmia    Essential hypertension 06/14/2022   Gastrointestinal hemorrhage 05/20/2023   Heart murmur    High risk medication use 07/31/2022   History of cardiomyopathy 11/09/2021   History of pulmonary embolism 09/24/2012   Overview:  Last Assessment & Plan:  Hx PE  x 2 per notes, ? Whether he was treated adequately  - check hypercoag panel prior to initiation of any anticoag for his A fib (or  recurrent PE)   Hypercholesterolemia 06/14/2022   Hyperlipidemia    Hypertension    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   Lumbar radiculopathy 01/24/2022   Malignant neoplasm of prostate (HCC) 10/04/2020   Malignant neoplasm of prostate metastatic to bone (HCC) 10/04/2020   Mitral valve insufficiency 06/14/2022   Obesity (BMI 30-39.9) 10/16/2017   Obstructive sleep apnea syndrome 09/24/2012   Overview:  Last Assessment & Plan:  Has [employer] patient, owns his machine.  - needs auto-titration study by [employer] or local company, data to RB to adjust his home device.    OSA (obstructive sleep apnea) 09/13/2022   Prediabetes    Primary osteoarthritis involving multiple joints 06/14/2022   Prostate cancer metastatic to bone (HCC) 06/14/2022   Pulmonary embolism (HCC)    Rhinitis    Sacral wound 04/11/2023   Seasonal allergies 06/14/2022   Spondylolisthesis of lumbar region 12/12/2021   Stage 3a chronic kidney disease (HCC) 06/14/2022   Unstageable pressure ulcer of sacral region (HCC) 03/10/2023   Past Surgical History:  Procedure Laterality Date   APPENDECTOMY     COLONOSCOPY N/A 05/23/2023   Procedure: COLONOSCOPY;  Surgeon: Elicia Claw, MD;  Location: [facility];  Service: Gastroenterology;  Laterality: N/A;   HERNIA REPAIR     LAMINECTOMY WITH POSTERIOR LATERAL ARTHRODESIS LEVEL 4 N/A 09/03/2023   Procedure: Posterior lateral fusion - Thoracic Ten-Thoracic Eleven - Thoracic Eleven -Thoracic Twelve - Thoracic Twelve-Lumbar One - Lumbar One-Lumbar Two - Lumbar Two-Lumbar Three - Lumbar Three-Lumbar Four, laminectomy and foraminotomy transpedicular decompression Lumbar One;  Surgeon: Onetha Kuba, MD;  Location: [facility] OR;  Service: Neurosurgery;  Laterality: N/A;  Posterior lateral fusion - T10-T11    LUMBAR LAMINECTOMY  02/06/2022   Facetectom & foraminotomy for decompression of the cauda equina & nerve root L4-5, Arley PHEBE Dark, MD   TONSILLECTOMY      Family History  Problem Relation Age of Onset   Stroke Mother    Alzheimer's disease Mother    Heart attack Mother    Stroke Father    Diabetes Mellitus II Father    Social History:  reports that he has never smoked. He has never used smokeless tobacco. He reports that he does not drink alcohol  and does not use drugs. Allergies:  Allergies  Allergen Reactions   Cipro [Ciprofloxacin Hcl] Other (See Comments)    Increased risk of rupture  of ascending thoracic aortic aneurysm   Medications Prior to Admission  Medication Sig Dispense Refill   acetaminophen  (TYLENOL ) 500 MG tablet Take 1,000 mg by mouth 2 (two) times daily.     methocarbamol  (ROBAXIN ) 500 MG tablet Take 500 mg by mouth every 6 (six) hours as needed for muscle spasms.     metoprolol  tartrate (LOPRESSOR ) 50 MG tablet TAKE 1 & 1/2 (ONE & ONE-HALF) TABLETS BY MOUTH TWICE DAILY 180 tablet 0   polyethylene glycol (MIRALAX  / GLYCOLAX ) 17 g packet Take 17 g by mouth daily as needed for mild constipation.     potassium chloride  SA (KLOR-CON  M20) 20 MEQ tablet Take 20 mEq by mouth daily in the afternoon.     rosuvastatin  (CRESTOR ) 5 MG tablet Take 1 tablet by mouth once daily 90 tablet 2   sacubitril -valsartan  (ENTRESTO ) 24-26 MG Take 1 tablet by mouth 2 (two) times daily. 180 tablet 3  tamsulosin  (FLOMAX ) 0.4 MG CAPS capsule TAKE 1 CAPSULE BY MOUTH TWICE DAILY 30 MINUTES FOLLOWING  THE  SAME  MEAL  EACH  DAY 180 capsule 0   torsemide  (DEMADEX ) 20 MG tablet Take 1 tablet by mouth twice daily 180 tablet 0   traMADol  (ULTRAM ) 50 MG tablet Take 2 tablets (100 mg total) by mouth every 8 (eight) hours as needed. 120 tablet 1   FARXIGA  5 MG TABS tablet Take 1 tablet by mouth once daily (Patient not taking: Reported on 09/04/2023) 90 tablet 0   Methylcobalamin (METHYL B-12 PO) Take 5,000 mcg by mouth every evening. (Patient not taking: Reported on 09/04/2023)     Multiple Vitamins-Minerals (MENS 50+ MULTIVITAMIN) TABS Take 1 tablet by  mouth every evening. (Patient not taking: Reported on 09/04/2023)     rivaroxaban  (XARELTO ) 20 MG TABS tablet Take 1 tablet (20 mg total) by mouth daily with supper. (Patient not taking: Reported on 09/04/2023)        Home: Home Living Family/patient expects to be discharged to:: Private residence Living Arrangements: Spouse/significant other Available Help at Discharge: Family, Available 24 hours/day Type of Home: House Home Access: Stairs to enter [employer] of Steps: 1 Entrance Stairs-Rails: Left, Right Home Layout: One level Bathroom Shower/Tub: Health visitor: Standard Bathroom Accessibility: Yes Home Equipment: Agricultural consultant (2 wheels), [employer] - single point, Information systems manager, Retail buyer, Rollator (4 wheels)  Lives With: Spouse   Functional History: Prior Function Prior Level of Function : Needs assist Mobility Comments: Ambulating with RW, Fall earlier this year resulting in Rt hip fracture ADLs Comments: Needs assist for LB dressing, wife does IADL  Functional Status:  Mobility: Bed Mobility Overal bed mobility: Needs Assistance Bed Mobility: Rolling, Sidelying to Sit Rolling: Min assist, Used rails, +2 for safety/equipment Sidelying to sit: +2 for physical assistance, Used rails, HOB elevated, Min assist Sit to supine: Mod assist, +2 for safety/equipment Sit to sidelying: Mod assist, Used rails General bed mobility comments: Pt requires 1 step cues to sequence bed mobility with reinforcement of back alignment to avoid twisting. HOB ~45 deg prior to transfer which seemed to help with lifting. Bed rail also used PRN. Transfers Overall transfer level: Needs assistance Equipment used: Rolling walker (2 wheels) Transfers: Sit to/from Stand Sit to Stand: Mod assist, +2 safety/equipment, From elevated surface Bed to/from chair/wheelchair/BSC transfer type:: Step pivot Step pivot transfers: Mod assist, +2 safety/equipment General transfer comment: Bed  elevated ~4 prior to STS trial. ModA +1 lift assist, +2 safety with NT standing by. Pt need repetition for reinforcement of safety with RW as pt tending to let go of RW handle prior to reaching [facility] with bowel urgency expressed multiple times. Primofit stayed plugged in with urination through primofit while pivoting to [facility]. Pt cued to push from bed with LUE as he prefers single UE on RW handle, pt also instructed on no bending past 90 deg before trial with good carryover. Cues to avoid twisting while pivoting needed and tactile/vcs where to place hands with stand>sit. Ambulation/Gait Ambulation/Gait assistance: Min assist, Mod assist, +2 physical assistance Gait Distance (Feet): 3 Feet Assistive device: Rolling walker (2 wheels) Gait Pattern/deviations: Decreased stride length, Decreased dorsiflexion - right, Shuffle, Trunk flexed, Step-to pattern General Gait Details: shuffled steps to [facility]; bowel urgency so not able to progress further distance. Gait velocity: decreased Gait velocity interpretation: <1.31 ft/sec, indicative of household ambulator Pre-gait activities: Static march, limited, mod assist.    ADL: ADL Overall ADL's : Needs  assistance/impaired Eating/Feeding: Set up Grooming: Set up, Sitting Grooming Details (indicate cue type and reason): limited by A line in dominant hand, asking RN and staff to assist with hand to mouth bc of this Upper Body Bathing: Moderate assistance Lower Body Bathing: Maximal assistance Upper Body Dressing : Maximal assistance Upper Body Dressing Details (indicate cue type and reason): doffing brace Lower Body Dressing: Maximal assistance Lower Body Dressing Details (indicate cue type and reason): doff socks, unable to perform figure 4 Toilet Transfer: +2 for physical assistance, Moderate assistance (sit <> stand at eob with need to return to sitting for bed pan. pt voiding and having sensation of void starting) Toilet Transfer Details (indicate cue type  and reason): face to face simulated through return to bed Toileting- Clothing Manipulation and Hygiene: Total assistance Toileting - Clothing Manipulation Details (indicate cue type and reason): diarrhea Functional mobility during ADLs: Moderate assistance (1 person HHA/face to face) General ADL Comments: pt completed bed mobility, eob sitting with knee extension and sit<> stand x3. pt with incontinence of stool initially. pt noted to have skin break down  Cognition: Cognition Orientation Level: Oriented X4 Cognition Arousal: Alert Behavior During Therapy: WFL for tasks assessed/performed  Physical Exam: Blood pressure (!) 132/57, pulse 77, temperature 98.4 F (36.9 C), temperature source Oral, resp. rate 18, height 5' 11 (1.803 m), weight 75.2 kg, SpO2 93%.  General: No apparent distress HEENT: Head is normocephalic, atraumatic, sclera anicteric, oral mucosa pink and moist, wearing glasses Heart: RRR Chest: CTA bilaterally without wheezes, rales, or rhonchi; no distress, on room air  Abdomen: Soft, non-tender, non-distended, bowel sounds positive. Extremities: no edema Psych: Pt's affect is appropriate. Pt is cooperative Skin: Bruising on b/l UE, Incision with dressing CDI, foam dressing both feet Neuro:     Mental Status: AAOx4, memory grossly intact Speech/Languate: fluent, follows commands CRANIAL NERVES:2-12  intact     MOTOR: RUE: 4+/5 Deltoid,4+/5 Biceps, 4+/5 Triceps,4+/5 Grip LUE: 4+/5 Deltoid, 4+/5 Biceps, 4+/5 Triceps, 4+/5 Grip RLE: HF 4-/5, KE 4+/5, ADF 0/5, APF 4/5 LLE: HF 4-/5, KE 4+/5, ADF 4/5, APF 4/5     REFLEXES: NO ankle clonus   SENSORY: Normal to touch all 4 extremities   Coordination: No UE ataxia/dysmetria noted  Picc R arm, IV L arm   Results for orders placed or performed during the hospital encounter of 09/03/23 (from the past 48 hours)  Heparin  level (unfractionated)     Status: Abnormal   Collection Time: 09/08/23  6:30 PM  Result  Value Ref Range   Heparin  Unfractionated 0.17 (L) 0.30 - 0.70 IU/mL    Comment: (NOTE) The clinical reportable range upper limit is being lowered to >1.10 to align with the FDA approved guidance for the current laboratory assay.  If heparin  results are below expected values, and patient dosage has  been confirmed, suggest follow up testing of antithrombin III levels. Performed at [facility], [address], [place], [place] [address]   CBC     Status: Abnormal   Collection Time: 09/09/23  4:54 AM  Result Value Ref Range   WBC 8.2 4.0 - 10.5 K/uL   RBC 2.55 (L) 4.22 - 5.81 MIL/uL   Hemoglobin 7.8 (L) 13.0 - 17.0 g/dL   HCT 75.7 (L) 60.9 - 47.9 %   MCV 94.9 80.0 - 100.0 fL   MCH 30.6 26.0 - 34.0 pg   MCHC 32.2 30.0 - 36.0 g/dL   RDW 84.8 88.4 - 84.4 %   Platelets 192  150 - 400 K/uL   nRBC 0.0 0.0 - 0.2 %    Comment: Performed at [facility], [address] [place], [place] [address]  Magnesium      Status: None   Collection Time: 09/09/23  4:54 AM  Result Value Ref Range   Magnesium  2.2 1.7 - 2.4 mg/dL    Comment: Performed at [facility], [address], [place], [place] [address]  Phosphorus     Status: Abnormal   Collection Time: 09/09/23  4:54 AM  Result Value Ref Range   Phosphorus 2.3 (L) 2.5 - 4.6 mg/dL    Comment: Performed at [facility], [address], [place], [place] [address]  Comprehensive metabolic panel     Status: Abnormal   Collection Time: 09/09/23  4:54 AM  Result Value Ref Range   Sodium 137 135 - 145 mmol/L   Potassium 4.3 3.5 - 5.1 mmol/L   Chloride 102 98 - 111 mmol/L   CO2 26 22 - 32 mmol/L   Glucose, Bld 96 70 - 99 mg/dL    Comment: Glucose reference range applies only to samples taken after fasting for at least 8 hours.   BUN 13 8 - 23 mg/dL   Creatinine, Ser 8.99 0.61 - 1.24 mg/dL   Calcium  8.4 (L) 8.9 - 10.3 mg/dL   Total Protein 5.2 (L) 6.5 - 8.1 g/dL   Albumin  2.6 (L) 3.5 - 5.0 g/dL   AST 29 15 - 41 U/L    ALT 10 0 - 44 U/L   Alkaline Phosphatase 61 38 - 126 U/L   Total Bilirubin 0.6 0.0 - 1.2 mg/dL   GFR, Estimated >39 >39 mL/min    Comment: (NOTE) Calculated using the CKD-EPI Creatinine Equation (2021)    Anion gap 9 5 - 15    Comment: Performed at [facility], [address], [place], [place] [address]  Heparin  level (unfractionated)     Status: None   Collection Time: 09/09/23  4:54 AM  Result Value Ref Range   Heparin  Unfractionated 0.33 0.30 - 0.70 IU/mL    Comment: (NOTE) The clinical reportable range upper limit is being lowered to >1.10 to align with the FDA approved guidance for the current laboratory assay.  If heparin  results are below expected values, and patient dosage has  been confirmed, suggest follow up testing of antithrombin III levels. Performed at [facility], [address], [place], [place] [address]   Heparin  level (unfractionated)     Status: None   Collection Time: 09/09/23  1:00 PM  Result Value Ref Range   Heparin  Unfractionated 0.33 0.30 - 0.70 IU/mL    Comment: (NOTE) The clinical reportable range upper limit is being lowered to >1.10 to align with the FDA approved guidance for the current laboratory assay.  If heparin  results are below expected values, and patient dosage has  been confirmed, suggest follow up testing of antithrombin III levels. Performed at [facility], [address], [place], [place] [address]    No results found.    Blood pressure (!) 132/57, pulse 77, temperature 98.4 F (36.9 C), temperature source Oral, resp. rate 18, height 5' 11 (1.803 m), weight 75.2 kg, SpO2 93%.  Medical Problem List and Plan: 1. Functional deficits secondary to L1 burst fracture with cord compression status post T10-L4 fusion with transpedicular decompression L1.  Lumbar corset when out of bed applied in sitting position  -patient may shower  -  ELOS/Goals: 10-12 days, PT/OT mina/sup  -Admit to CIR 2.   Antithrombotics: -DVT/anticoagulation:  Pharmaceutical: Heparin  transitioning back to Xarelto   -antiplatelet therapy: N/A 3. Pain Management: Tramadol /hydrocodone  as needed, Robaxin  500 mg 4 times daily 4. Mood/Behavior/Sleep: Provide emotional support  -antipsychotic agents: N/A 5. Neuropsych/cognition: This patient is capable of making decisions on his own behalf. 6. Skin/Wound Care: Routine skin checks 7. Fluids/Electrolytes/Nutrition: Routine in and outs with follow-up chemistries 8.  Acute blood loss anemia.  Follow-up CBC 9.  Shock/narrow complex tachycardia with hypotension.  Resolved.  Follow-up cardiology services 10.  Chronic systolic congestive heart failure/nonischemic CMP.  Demadex  20 mg daily, Aldactone  25 mg daily, Farxiga  10 mg daily.  Monitor for any fluid overload. Daily weights 11.  CAD.  Toprol -XL 12.5 mg daily.  Monitor with increased mobility 12.  Atrial fibrillation.  Transitioning from IV heparin  back to Xarelto .  Continue amiodarone  200 mg twice daily for now.  Continue metoprolol  13.  CKD stage III.  Follow-up chemistries.  Avoid nephrotoxic medications. 14.  Hyperlipidemia.  Crestor  15.  History of prostate cancer.  Flomax  0.4 mg daily. 16.  Constipation.  MiraLAX  twice daily  - LBM 9/2, continent   Toribio JINNY Pitch, PA-C 09/10/2023  I have personally performed a face to face diagnostic evaluation of this patient and formulated the key components of the plan.  Additionally, I have personally reviewed laboratory data, imaging studies, as well as relevant notes and concur with the physician assistant's documentation above.  The patient's status has not changed from the original H&P.  Any changes in documentation from the acute care chart have been noted above.  Murray Collier, MD

## 2023-09-10 NOTE — Discharge Instructions (Addendum)
 Inpatient Rehab Discharge Instructions  Aaron Richmond Discharge date and time: No discharge date for patient encounter.   Activities/Precautions/ Functional Status: Activity: Back brace when out of bed Diet: Regular Wound Care: Routine skin checks Functional status:  ___ No restrictions     ___ Walk up steps independently ___ 24/7 supervision/assistance   ___ Walk up steps with assistance ___ Intermittent supervision/assistance  ___ Bathe/dress independently ___ Walk with walker     _x__ Bathe/dress with assistance ___ Walk Independently    ___ Shower independently ___ Walk with assistance    ___ Shower with assistance ___ No alcohol      ___ Return to work/school ________  Special Instructions: No driving smoking or alcohol   COMMUNITY REFERRALS UPON DISCHARGE:    Outpatient: PT              Agency:[facility]        Phone:[phone]              Appointment Date/Time:*Please expect follow-up within 7-10 business days to schedule your appointment. If you have not received follow-up, be sure to contact the site directly.*     My questions have been answered and I understand these instructions. I will adhere to these goals and the provided educational materials after my discharge from the hospital.  Patient/Caregiver Signature _______________________________ Date __________  Clinician Signature _______________________________________ Date __________  Please bring this form and your medication list with you to all your follow-up doctor's appointments.

## 2023-09-10 NOTE — Plan of Care (Signed)
  Problem: Education: Goal: Knowledge of General Education information will improve Description: Including pain rating scale, medication(s)/side effects and non-pharmacologic comfort measures Outcome: Progressing   Problem: Health Behavior/Discharge Planning: Goal: Ability to manage health-related needs will improve Outcome: Progressing   Problem: Activity: Goal: Risk for activity intolerance will decrease Outcome: Progressing   Problem: Nutrition: Goal: Adequate nutrition will be maintained Outcome: Progressing   Problem: Coping: Goal: Level of anxiety will decrease Outcome: Progressing   Problem: Pain Managment: Goal: General experience of comfort will improve and/or be controlled Outcome: Not Met (add Reason) Note: On-going

## 2023-09-10 NOTE — Progress Notes (Signed)
 Inpatient Rehab Admissions Coordinator:    CIR following, I do not currently have a bed for this Pt. I did receive insurance authorization. I will update when I have a bed.   Leita Kleine, MS, CCC-SLP Rehab Admissions Coordinator  [phone] (celll) [phone] (office)

## 2023-09-10 NOTE — Progress Notes (Signed)
 PROGRESS NOTE  Aaron Richmond FMW:[id number] DOB: 1945-06-29 DOA: 09/03/2023 PCP: Fleeta Valeria Mayo, MD   LOS: 7 days   Brief narrative:  78 yo male with past medical history of atrial fibrillation, heart failure with reduced ejection fraction, thoracic aortic aneurysm, CAD had symptoms of right lower extremity numbness tingling and back pain and was initially admitted to neurosurgery service on 09/03/2023 for  transpedicular decompression L1, instrumented fusion T10-L4 for severe L1 cord compression / burst fracture.  Postoperative bili patient had significant unstable SVT.  Cardiology was consulted and patient Failed cardioversion x2 at 200J, failed adenosine 6 & 12. Failed add'l 200J CV attempt, and converted to Afib RVR with 360J CV, with improvement in blood pressures.  At this time patient has remained stable and awaiting for CIR.   Assessment/Plan: Principal Problem:   Lumbar burst fracture (HCC) Active Problems:   Atrial fibrillation (HCC)   Ascending aortic aneurysm (HCC)   Coronary artery calcification seen on CT scan   Hyperlipidemia   Malignant neoplasm of prostate (HCC)   Obesity (BMI 30-39.9)   BPH (benign prostatic hyperplasia)   CKD (chronic kidney disease) stage 2, GFR 60-89 ml/min   Prostate cancer metastatic to bone [facility])   Essential hypertension   OSA (obstructive sleep apnea)   Shock (HCC)  L1 burst fracture with cord compression - Status post thoracolumbar fusion T10-L4.  Neurosurgery following.  PT has seen the patient and plan for inpatient rehab.  Postop SVT(unstable), paroxysmal atrial fibrillation Cardiology followed the patient during hospitalization.  Failed cardioversion attempts.  Subsequently had fibrillation.  On anticoagulation with IV heparin ..  Discussed with Dr. Onetha today and he is okay with oral anticoagulants at this time.  Continue amiodarone  metoprolol .  Has been started on Xarelto .  Normal sinus rhythm in telemetry.   Chronic systolic  CHF Continue Toprol  XL, Aldactone .  Heart failure team following.  Volume status good on torsemide  20.  Farxiga  10 mg to be added today.  Will need 2D echocardiogram in few months to see if improvement in EF.    DVT prophylaxis: Place TED hose Start: 09/05/23 1338 SCD's Start: 09/03/23 1712 rivaroxaban  (XARELTO ) tablet 20 mg   Disposition: CIR  Status is: Inpatient Remains inpatient appropriate because: Need for rehabilitation.    Code Status:     Code Status: Full Code  Family Communication: None at bedside  Consultants: Neurosurgery Cardiology  Procedures: DC cardioversion Thoracolumbar fusion T10-L4.  Anti-infectives:  None  Anti-infectives (From admission, onward)    Start     Dose/Rate Route Frequency Ordered Stop   09/03/23 1800  ceFAZolin  (ANCEF ) IVPB 2g/100 mL premix        2 g 200 mL/hr over 30 Minutes Intravenous Every 8 hours 09/03/23 1711 09/05/23 1105   09/03/23 0600  ceFAZolin  (ANCEF ) IVPB 2g/100 mL premix        2 g 200 mL/hr over 30 Minutes Intravenous On call to O.R. 09/03/23 0554 09/03/23 0754        Subjective: Today, patient was seen and examined at bedside.  Patient denies any chest pain/shortness of breath dyspnea dizziness lightheadedness or fever.  Sitting up in the bedside chair.  No shortness of breath or dyspnea.  Worked with physical therapy yesterday.  Has some pain on the surgical site.  Objective: Vitals:   09/10/23 0826 09/10/23 1019  BP:  (!) 132/57  Pulse: 72 77  Resp:    Temp:    SpO2: 93% 95%    Intake/Output Summary (Last 24  hours) at 09/10/2023 1124 Last data filed at 09/10/2023 0800 Gross per 24 hour  Intake 354.08 ml  Output 2725 ml  Net -2370.92 ml   Filed Weights   09/03/23 0601 09/04/23 0555 09/06/23 0652  Weight: 76.2 kg 78 kg 75.2 kg   Body mass index is 23.12 kg/m.   Physical Exam:  GENERAL: Patient is alert awake and oriented. Not in obvious distress.,  Elderly male, HENT: No scleral pallor or  icterus. Pupils equally reactive to light. Oral mucosa is moist NECK: is supple, no gross swelling noted. CHEST: Clear to auscultation. No crackles or wheezes.  Diminished breath sounds bilaterally. CVS: S1 and S2 heard, no murmur. Regular rate and rhythm.  ABDOMEN: Soft, non-tender, bowel sounds are present. EXTREMITIES: No edema.  Right foot drop. CNS: Cranial nerves are intact.  Moves lower extremities. SKIN: warm and dry without rashes.  Data Review: I have personally reviewed the following laboratory data and studies,  CBC: Recent Labs  Lab 09/05/23 1200 09/06/23 1008 09/07/23 0500 09/08/23 0500 09/09/23 0454  WBC 13.3* 10.2 9.2 9.4 8.2  HGB 10.3* 9.2* 8.0* 8.1* 7.8*  HCT 30.8* 27.7* 24.8* 25.0* 24.2*  MCV 93.1 93.6 95.0 96.2 94.9  PLT 172 161 162 190 192   Basic Metabolic Panel: Recent Labs  Lab 09/03/23 1440 09/05/23 0312 09/06/23 0500 09/06/23 0512 09/07/23 0500 09/08/23 0500 09/09/23 0454  NA 137 134* 135  --  136 138 137  K 3.9 3.9 3.4*  --  3.5 4.3 4.3  CL 103 96* 95*  --  100 101 102  CO2 20* 26 28  --  29 28 26   GLUCOSE 179* 142* 108*  --  95 104* 96  BUN 20 22 22   --  21 16 13   CREATININE 1.23 1.65* 1.31*  --  1.26* 1.12 1.00  CALCIUM  8.7* 8.3* 8.6*  --  8.7* 8.7* 8.4*  MG 1.8 2.4  --  2.2 2.1 2.2 2.2  PHOS 4.1  --   --   --  2.4* 2.2* 2.3*   Liver Function Tests: Recent Labs  Lab 09/09/23 0454  AST 29  ALT 10  ALKPHOS 61  BILITOT 0.6  PROT 5.2*  ALBUMIN  2.6*   No results for input(s): LIPASE, AMYLASE in the last 168 hours. No results for input(s): AMMONIA in the last 168 hours. Cardiac Enzymes: No results for input(s): CKTOTAL, CKMB, CKMBINDEX, TROPONINI in the last 168 hours. BNP (last 3 results) No results for input(s): BNP in the last 8760 hours.  ProBNP (last 3 results) Recent Labs    11/08/22 1623  PROBNP 1,382*    CBG: No results for input(s): GLUCAP in the last 168 hours. No results found for this or any  previous visit (from the past 240 hours).   Studies: No results found.    Eleanna Theilen, MD  [facility] 09/10/2023  If 7PM-7AM, please contact night-coverage

## 2023-09-10 NOTE — Progress Notes (Signed)
 Physical Medicine and Rehabilitation Consult Reason for Consult: Rehab Referring Physician: Dr. Daren     HPI: Aaron Richmond is a 78 y.o. male who has past medical history of aortic root aneurysm, CHF, CKD, hypertension, lumbar radiculopathy, prediabetes, CKD, heart failure, OSA, A-fib, CAD who was admitted for spinal fusion after he was found to have severe spinal cord compression at L1 burst fracture status post kyphoplasty.  Patient had history of prior L4 S1 fusion as well as kyphoplasty but developed progressive back pain, numbness and weakness in his legs.  On 09/03/2023 patient had T10-L4 fusion by Dr. Onetha on 09/03/2023.  Patient developed narrow complex tachycardia with hypotension, initially failed cardioversion attempts but later converted to A-fib RVR with 360 J shock with improved pressures.  Patient treated with amiodarone  and pressors for hypotension. Prior to admission pt reports limited mobility due to back pain. He was ambulating with the walker mostly for shorter distances. Has chronic R foot drop that started after hip surgery, reports he had recent EMG/NCS. Has a AFO for his RLE.   Patient was seen by PT and OT and found to have functional deficits and felt to be a candidate for inpatient rehabilitation.   Chart review indicates patient lives in a 1 level home with one-step to enter, family available 24/7 after discharge.      Home: Home Living Family/patient expects to be discharged to:: Private residence Living Arrangements: Spouse/significant other Available Help at Discharge: Family, Available 24 hours/day Type of Home: House Home Access: Stairs to enter [employer] of Steps: 1 Entrance Stairs-Rails: Left, Right Home Layout: One level Bathroom Shower/Tub: Health visitor: Standard Bathroom Accessibility: Yes Home Equipment: Agricultural consultant (2 wheels), [employer] - single point, Information systems manager, Lift chair, Rollator (4 wheels)  Functional  History: Prior Function Prior Level of Function : Needs assist Mobility Comments: Ambulating with RW, Fall earlier this year resulting in Rt hip fracture ADLs Comments: Needs assist for LB dressing, wife does IADL Functional Status:  Mobility: Bed Mobility Overal bed mobility: Needs Assistance Bed Mobility: Rolling, Sit to Sidelying Rolling: Min assist, Used rails Sidelying to sit: Min assist, Used rails Sit to sidelying: Mod assist, Used rails General bed mobility comments: in recliner when PT entered room Transfers Overall transfer level: Needs assistance Equipment used: Standard walker Transfers: Sit to/from Stand Sit to Stand: Mod assist Bed to/from chair/wheelchair/BSC transfer type:: Step pivot Step pivot transfers: Mod assist General transfer comment: Mod assist for boost to stand with cues for hand placement and hip hinge to maintain spinal precautions. Heavy retropulsion present, fear of falling forward, gradually improves but limited by dizziness. Needs assist to control descent into chair. Mod assis to scoot to Peterson of chair. Ambulation/Gait Pre-gait activities: Static march, limited, mod assist.   ADL: ADL Overall ADL's : Needs assistance/impaired Eating/Feeding: Set up Grooming: Set up, Sitting Grooming Details (indicate cue type and reason): limited by A line in dominant hand, asking RN and staff to assist with hand to mouth bc of this Upper Body Bathing: Moderate assistance Lower Body Bathing: Maximal assistance Upper Body Dressing : Maximal assistance Upper Body Dressing Details (indicate cue type and reason): doffing brace Lower Body Dressing: Maximal assistance Lower Body Dressing Details (indicate cue type and reason): doff socks, unable to perform figure 4 Toilet Transfer: Moderate assistance, Stand-pivot (1 person HHA) Toilet Transfer Details (indicate cue type and reason): face to face simulated through return to bed Toileting- Clothing Manipulation and  Hygiene: Maximal assistance, Bed level  Functional mobility during ADLs: Moderate assistance (1 person HHA/face to face)   Cognition: Cognition Orientation Level: Oriented X4, Oriented to person, Oriented to place, Oriented to time, Oriented to situation Cognition Arousal: Alert Behavior During Therapy: [facility] for tasks assessed/performed     Review of Systems  Constitutional:  Negative for chills and fever.  HENT:  Positive for hearing loss (neeeds new hearing aids).   Eyes:  Negative for blurred vision and double vision.  Respiratory:  Negative for shortness of breath.   Cardiovascular:  Negative for chest pain.  Gastrointestinal:  Negative for abdominal pain, constipation, diarrhea, nausea and vomiting.  Genitourinary:  Positive for urgency.  Musculoskeletal:  Positive for back pain and joint pain.  Skin:  Negative for rash.  Neurological:  Positive for dizziness, tingling, sensory change, focal weakness (Rt foot drop- chronic) and weakness. Negative for headaches.  Psychiatric/Behavioral:  The patient has insomnia.        Past Medical History:  Diagnosis Date   Aneurysm of aortic root      Overview:  Last Assessment & Plan:  Planning to see Dr Army for evaluation of 5cm aneurysm.  - will check PFT in prep for possible procedure / SGY   Aortic atherosclerosis (HCC) 06/14/2022   Aortic regurgitation     Aortic valve insufficiency     Ascending aortic aneurysm (HCC) 09/11/2016   Atherosclerotic vascular disease 05/27/2022   Atrial fibrillation (HCC)     Atrial fibrillation with RVR (HCC) 07/05/2022   BMI 27.0-27.9,adult 06/14/2022   BPH (benign prostatic hyperplasia)     BRBPR (bright red blood per rectum) 05/20/2023   Cardiomyopathy (HCC) 09/11/2016   CHF (congestive heart failure) (HCC)     CKD (chronic kidney disease) stage 2, GFR 60-89 ml/min 11/23/2021   Closed fracture of right hip (HCC) 03/10/2023   Coronary artery calcification seen on CT scan 10/13/2017    Dysrhythmia     Essential hypertension 06/14/2022   Gastrointestinal hemorrhage 05/20/2023   Heart murmur     High risk medication use 07/31/2022   History of cardiomyopathy 11/09/2021   History of pulmonary embolism 09/24/2012    Overview:  Last Assessment & Plan:  Hx PE x 2 per notes, ? 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  - needs auto-titration study by [employer] or local company, data to RB to adjust his home device.    OSA (obstructive sleep apnea) 09/13/2022   Prediabetes     Primary osteoarthritis involving multiple joints 06/14/2022   Prostate cancer metastatic to bone (HCC) 06/14/2022   Pulmonary embolism (HCC)     Rhinitis     Sacral wound 04/11/2023   Seasonal allergies 06/14/2022   Spondylolisthesis of lumbar region 12/12/2021   Stage 3a chronic kidney disease (HCC) 06/14/2022   Unstageable pressure ulcer of sacral region [facility]) 03/10/2023             Past Surgical History:  Procedure  Laterality Date   APPENDECTOMY       COLONOSCOPY N/A 05/23/2023    Procedure: COLONOSCOPY;  Surgeon: Elicia Claw, MD;  Location: [facility];  Service: Gastroenterology;  Laterality: N/A;   HERNIA REPAIR       LAMINECTOMY WITH POSTERIOR LATERAL ARTHRODESIS LEVEL 4 N/A 09/03/2023    Procedure: Posterior lateral fusion - Thoracic Ten-Thoracic Eleven - Thoracic Eleven -Thoracic Twelve - Thoracic Twelve-Lumbar One -  Lumbar One-Lumbar Two - Lumbar Two-Lumbar Three - Lumbar Three-Lumbar Four, laminectomy and foraminotomy transpedicular decompression Lumbar One;  Surgeon: Onetha Kuba, MD;  Location: [facility] OR;  Service: Neurosurgery;  Laterality: N/A;  Posterior lateral fusion - T10-T11    LUMBAR LAMINECTOMY   02/06/2022    Facetectom & foraminotomy for decompression of the cauda equina & nerve root L4-5, Arley PHEBE Dark, MD   TONSILLECTOMY                 Family History  Problem Relation Age of Onset   Stroke Mother     Alzheimer's disease Mother     Heart attack Mother     Stroke Father     Diabetes Mellitus II Father          Social History:  reports that he has never smoked. He has never used smokeless tobacco. He reports that he does not drink alcohol  and does not use drugs. Allergies:  Allergies       Allergies  Allergen Reactions   Cipro [Ciprofloxacin Hcl] Other (See Comments)      Increased risk of rupture  of ascending thoracic aortic aneurysm            Medications Prior to Admission  Medication Sig Dispense Refill   acetaminophen  (TYLENOL ) 500 MG tablet Take 1,000 mg by mouth 2 (two) times daily.       methocarbamol  (ROBAXIN ) 500 MG tablet Take 500 mg by mouth every 6 (six) hours as needed for muscle spasms.       metoprolol  tartrate (LOPRESSOR ) 50 MG tablet TAKE 1 & 1/2 (ONE & ONE-HALF) TABLETS BY MOUTH TWICE DAILY 180 tablet 0   polyethylene glycol (MIRALAX  / GLYCOLAX ) 17 g packet Take 17 g by mouth daily as needed for mild constipation.       potassium chloride  SA (KLOR-CON  M20) 20 MEQ tablet Take 20 mEq by mouth daily in the afternoon.       rosuvastatin  (CRESTOR ) 5 MG tablet Take 1 tablet by mouth once daily 90 tablet 2   sacubitril -valsartan  (ENTRESTO ) 24-26 MG Take 1 tablet by mouth 2 (two) times daily. 180 tablet 3   tamsulosin  (FLOMAX ) 0.4 MG CAPS capsule TAKE 1 CAPSULE BY MOUTH TWICE DAILY 30 MINUTES FOLLOWING  THE  SAME  MEAL  EACH  DAY 180 capsule 0   torsemide  (DEMADEX )  20 MG tablet Take 1 tablet by mouth twice daily 180 tablet 0   traMADol  (ULTRAM ) 50 MG tablet Take 2 tablets (100 mg total) by mouth every 8 (eight) hours as needed. 120 tablet 1   FARXIGA  5 MG TABS tablet Take 1 tablet by mouth once daily (Patient not taking: Reported on 09/04/2023) 90 tablet 0   Methylcobalamin (METHYL B-12 PO) Take 5,000 mcg by mouth every evening. (Patient not taking: Reported on 09/04/2023)       Multiple Vitamins-Minerals (MENS 50+ MULTIVITAMIN) TABS Take 1 tablet by mouth every evening. (Patient not taking: Reported on 09/04/2023)       rivaroxaban  (XARELTO ) 20 MG TABS tablet Take 1 tablet (  20 mg total) by mouth daily with supper. (Patient not taking: Reported on 09/04/2023)                Blood pressure (!) 100/51, pulse 85, temperature (!) 97.5 F (36.4 C), temperature source Oral, resp. rate (!) 23, height 5' 11 (1.803 m), weight 78 kg, SpO2 96%. Physical Exam   General: No apparent distress HEENT: Head is normocephalic, atraumatic, sclera anicteric, oral mucosa pink and moist, wearing glasses Heart: IIR Chest: CTA bilaterally without wheezes, rales, or rhonchi; no distress, Nasal o2 3L Abdomen: Soft, non-tender, non-distended, bowel sounds positive. Extremities: NO edema Psych: Pt's affect is appropriate. Pt is cooperative, Jovial  Skin: Bruising on b/l UE, Incision with dressing CDI Neuro:     Mental Status: AAOx4, memory grossly intact Speech/Languate: fluent, follows simple commands CRANIAL NERVES:2-12 grossly intact     MOTOR: RUE: 4/5 Deltoid,4+/5 Biceps, 4+/5 Triceps,4+/5 Grip LUE: 4/5 Deltoid, 4+/5 Biceps, 4+/5 Triceps, 4+/5 Grip RLE: HF 3/5, KE 4/5, ADF 0/5, APF 4-/5 LLE: HF 3/5, KE 4/5, ADF 4/5, APF 4/5     REFLEXES: NO ankle clonus, DTR hyporeflexive throughout   SENSORY: Normal to touch all 4 extremities   Coordination: No UE ataxia/dysmetria noted         Lab Results Last 24 Hours       Results for orders placed or performed during  the hospital encounter of 09/03/23 (from the past 24 hours)  Basic metabolic panel with GFR     Status: Abnormal    Collection Time: 09/05/23  3:12 AM  Result Value Ref Range    Sodium 134 (L) 135 - 145 mmol/L    Potassium 3.9 3.5 - 5.1 mmol/L    Chloride 96 (L) 98 - 111 mmol/L    CO2 26 22 - 32 mmol/L    Glucose, Bld 142 (H) 70 - 99 mg/dL    BUN 22 8 - 23 mg/dL    Creatinine, Ser 8.34 (H) 0.61 - 1.24 mg/dL    Calcium  8.3 (L) 8.9 - 10.3 mg/dL    GFR, Estimated 43 (L) >60 mL/min    Anion gap 12 5 - 15  Magnesium      Status: None    Collection Time: 09/05/23  3:12 AM  Result Value Ref Range    Magnesium  2.4 1.7 - 2.4 mg/dL       Imaging Results (Last 48 hours)  US  EKG SITE RITE Result Date: 09/05/2023 If Site Rite image not attached, placement could not be confirmed due to current cardiac rhythm.      Assessment/Plan: Diagnosis: L1 Burst fracture with cord compression s/p T10-L4 fusion with transpedicular decompression L1  Does the need for close, 24 hr/day medical supervision in concert with the patient's rehab needs make it unreasonable for this patient to be served in a less intensive setting? Yes Co-Morbidities requiring supervision/potential complications:  -Hypotension, SVT, HFrEF, CAD, AKI, sleep apnea, Aortic Insufficiency, chronic back pain, Afib, CAD Leukocytosis, ABLA, AKI, R foot drop-chronic Due to bladder management, bowel management, safety, skin/wound care, disease management, medication administration, pain management, and patient education, does the patient require 24 hr/day rehab nursing? Yes Does the patient require coordinated care of a physician, rehab nurse, therapy disciplines of PT/OT to address physical and functional deficits in the context of the above medical diagnosis(es)? Yes Addressing deficits in the following areas: balance, endurance, locomotion, strength, transferring, bowel/bladder control, bathing, dressing, feeding, grooming, toileting, and  psychosocial support Can the patient actively participate in an intensive  therapy program of at least 3 hrs of therapy per day at least 5 days per week? Yes The potential for patient to make measurable gains while on inpatient rehab is excellent Anticipated functional outcomes upon discharge from inpatient rehab are supervision and min assist  with PT, supervision and min assist with OT, n/a with SLP. Estimated rehab length of stay to reach the above functional goals is: 10 days Anticipated discharge destination: Home Overall Rehab/Functional Prognosis: good   POST ACUTE RECOMMENDATIONS: This patient's condition is appropriate for continued rehabilitative care in the following setting: CIR Patient has agreed to participate in recommended program. Yes Note that insurance prior authorization may be required for reimbursement for recommended care.   Comment: I think pt is a good candidate for CIR. Rehab admission coordinator fo f/u.      MEDICAL RECOMMENDATIONS: Consider additional medication for constipation, consider checking PVR/Bladder scans     I have personally performed a face to face diagnostic evaluation of this patient. Additionally, I have examined the patient's medical record including any pertinent labs and radiographic images.     Thanks,   Murray Collier, MD 09/05/2023

## 2023-09-10 NOTE — H&P (Addendum)
 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  Per chart review patient lives with spouse.  1 level home one-step to entry.  Ambulates with a rolling walker and right lower extremity AFO.  Presented 09/03/2023 with back and right leg pain numbness tingling in her legs.  Workup revealed severe cord compression at L1 marked spondylosis at that level recommendations of transpedicular decompression of L1 and posterior lateral instrumented fusion.  Patient underwent T10-L4 fusion by Dr. Onetha 09/03/2023 with posterior lateral arthrodesis T10-11, T11-12, T12-L1, L1-L2, L2-L3, L3-L4.  Placed in a lumbar corset applied in sitting position.  Hospital course patient developed narrow complex tachycardia with hypotension, initially failed cardioversion attempts but later converted to A-fib RVR with 360 J shock with improved pressures followed by cardiology services.  Patient treated with amiodarone  and pressors for hypotension.  Initially Xarelto  held for procedure patient currently transition to IV heparin  awaiting plan to resume Xarelto .  Hospital course acute blood loss anemia 7.8 and monitored.  Hx of foot drop RLE, has AFO. Reports had recent EMG/NCS outpatient.  Reports he has been continent of bowel and bladder.  Therapy evaluations completed due to patient's decreased functional mobility was admitted for a comprehensive rehab program.   Review of Systems  Constitutional:  Negative for chills and fever.  HENT:  Positive for hearing loss.   Eyes:  Negative for blurred vision and double vision.  Respiratory:   Negative for cough, shortness of breath and wheezing.   Cardiovascular:  Positive for palpitations and leg swelling. Negative for chest pain.  Gastrointestinal:  Positive for constipation. Negative for abdominal pain, heartburn, nausea and vomiting.  Genitourinary:  Positive for urgency. Negative for dysuria, flank pain and hematuria.  Musculoskeletal:  Positive for back pain, joint pain and myalgias.  Skin:  Negative for rash.  Neurological:  Positive for dizziness, tingling, sensory change and weakness. Negative for headaches.       Right chronic foot drop  Psychiatric/Behavioral:  The patient has insomnia.   All other systems reviewed and are negative.      Past Medical History:  Diagnosis Date   Aneurysm of aortic root      Overview:  Last Assessment & Plan:  Planning to see Dr Army for evaluation of 5cm aneurysm.  - will check PFT in prep for possible procedure / SGY   Aortic atherosclerosis (HCC) 06/14/2022   Aortic regurgitation     Aortic valve insufficiency     Ascending aortic aneurysm (HCC) 09/11/2016   Atherosclerotic vascular disease 05/27/2022   Atrial fibrillation (HCC)     Atrial fibrillation with RVR (HCC) 07/05/2022   BMI 27.0-27.9,adult 06/14/2022   BPH (benign prostatic hyperplasia)     BRBPR (bright red blood per rectum) 05/20/2023   Cardiomyopathy (HCC) 09/11/2016   CHF (congestive heart failure) (HCC)     CKD (chronic kidney disease) stage 2, GFR 60-89 ml/min 11/23/2021   Closed fracture of right hip (HCC) 03/10/2023   Coronary artery calcification seen on CT scan 10/13/2017   Dysrhythmia     Essential hypertension 06/14/2022   Gastrointestinal hemorrhage 05/20/2023   Heart murmur     High risk medication use 07/31/2022   History of cardiomyopathy  11/09/2021   History of pulmonary embolism 09/24/2012    Overview:  Last Assessment & Plan:  Hx PE x 2 per notes, ? Whether he was treated adequately  - check hypercoag panel prior to initiation of any  anticoag for his A fib (or recurrent PE)   Hypercholesterolemia 06/14/2022   Hyperlipidemia     Hypertension     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   Lumbar radiculopathy 01/24/2022   Malignant neoplasm of prostate (HCC) 10/04/2020   Malignant neoplasm of prostate metastatic to bone (HCC) 10/04/2020   Mitral valve insufficiency 06/14/2022   Obesity (BMI 30-39.9) 10/16/2017   Obstructive sleep apnea syndrome 09/24/2012    Overview:  Last Assessment & Plan:  Has [employer] patient, owns his machine.  - needs auto-titration study by [employer] or local company, data to RB to adjust his home device.    OSA (obstructive sleep apnea) 09/13/2022   Prediabetes     Primary osteoarthritis involving multiple joints 06/14/2022   Prostate cancer metastatic to bone (HCC) 06/14/2022   Pulmonary embolism (HCC)     Rhinitis     Sacral wound 04/11/2023   Seasonal allergies 06/14/2022   Spondylolisthesis of lumbar region 12/12/2021   Stage 3a chronic kidney disease (HCC) 06/14/2022   Unstageable pressure ulcer of sacral region (HCC) 03/10/2023             Past Surgical History:  Procedure Laterality Date   APPENDECTOMY       COLONOSCOPY N/A 05/23/2023    Procedure: COLONOSCOPY;  Surgeon: Elicia Claw, MD;  Location: [facility];  Service: Gastroenterology;  Laterality: N/A;   HERNIA REPAIR       LAMINECTOMY WITH POSTERIOR LATERAL ARTHRODESIS LEVEL 4 N/A 09/03/2023    Procedure: Posterior lateral fusion - Thoracic Ten-Thoracic Eleven - Thoracic Eleven -Thoracic Twelve - Thoracic Twelve-Lumbar One - Lumbar One-Lumbar Two - Lumbar Two-Lumbar Three - Lumbar Three-Lumbar Four, laminectomy and foraminotomy transpedicular decompression Lumbar One;  Surgeon: Onetha Kuba, MD;  Location: [facility] OR;  Service: Neurosurgery;  Laterality: N/A;  Posterior lateral fusion - T10-T11    LUMBAR LAMINECTOMY   02/06/2022    Facetectom & foraminotomy for decompression of the cauda equina & nerve  root L4-5, Arley PHEBE Dark, MD   TONSILLECTOMY                 Family History  Problem Relation Age of Onset   Stroke Mother     Alzheimer's disease Mother     Heart attack Mother     Stroke Father     Diabetes Mellitus II Father          Social History:  reports that he has never smoked. He has never used smokeless tobacco. He reports that he does not drink alcohol  and does not use drugs. Allergies:  Allergies       Allergies  Allergen Reactions   Cipro [Ciprofloxacin Hcl] Other (See Comments)      Increased risk of rupture  of ascending thoracic aortic aneurysm            Medications Prior to Admission  Medication Sig Dispense Refill   acetaminophen  (TYLENOL ) 500 MG tablet Take 1,000 mg by mouth 2 (two) times daily.       methocarbamol  (ROBAXIN ) 500 MG tablet Take 500 mg by mouth every 6 (six) hours as needed for muscle spasms.       metoprolol  tartrate (LOPRESSOR ) 50 MG tablet TAKE 1 & 1/2 (ONE & ONE-HALF) TABLETS  BY MOUTH TWICE DAILY 180 tablet 0   polyethylene glycol (MIRALAX  / GLYCOLAX ) 17 g packet Take 17 g by mouth daily as needed for mild constipation.       potassium chloride  SA (KLOR-CON  M20) 20 MEQ tablet Take 20 mEq by mouth daily in the afternoon.       rosuvastatin  (CRESTOR ) 5 MG tablet Take 1 tablet by mouth once daily 90 tablet 2   sacubitril -valsartan  (ENTRESTO ) 24-26 MG Take 1 tablet by mouth 2 (two) times daily. 180 tablet 3   tamsulosin  (FLOMAX ) 0.4 MG CAPS capsule TAKE 1 CAPSULE BY MOUTH TWICE DAILY 30 MINUTES FOLLOWING  THE  SAME  MEAL  EACH  DAY 180 capsule 0   torsemide  (DEMADEX ) 20 MG tablet Take 1 tablet by mouth twice daily 180 tablet 0   traMADol  (ULTRAM ) 50 MG tablet Take 2 tablets (100 mg total) by mouth every 8 (eight) hours as needed. 120 tablet 1   FARXIGA  5 MG TABS tablet Take 1 tablet by mouth once daily (Patient not taking: Reported on 09/04/2023) 90 tablet 0   Methylcobalamin (METHYL B-12 PO) Take 5,000 mcg by mouth every evening.  (Patient not taking: Reported on 09/04/2023)       Multiple Vitamins-Minerals (MENS 50+ MULTIVITAMIN) TABS Take 1 tablet by mouth every evening. (Patient not taking: Reported on 09/04/2023)       rivaroxaban  (XARELTO ) 20 MG TABS tablet Take 1 tablet (20 mg total) by mouth daily with supper. (Patient not taking: Reported on 09/04/2023)                  Home: Home Living Family/patient expects to be discharged to:: Private residence Living Arrangements: Spouse/significant other Available Help at Discharge: Family, Available 24 hours/day Type of Home: House Home Access: Stairs to enter [employer] of Steps: 1 Entrance Stairs-Rails: Left, Right Home Layout: One level Bathroom Shower/Tub: Health visitor: Standard Bathroom Accessibility: Yes Home Equipment: Agricultural consultant (2 wheels), [employer] - single point, Information systems manager, Retail buyer, Rollator (4 wheels)  Lives With: Spouse   Functional History: Prior Function Prior Level of Function : Needs assist Mobility Comments: Ambulating with RW, Fall earlier this year resulting in Rt hip fracture ADLs Comments: Needs assist for LB dressing, wife does IADL   Functional Status:  Mobility: Bed Mobility Overal bed mobility: Needs Assistance Bed Mobility: Rolling, Sidelying to Sit Rolling: Min assist, Used rails, +2 for safety/equipment Sidelying to sit: +2 for physical assistance, Used rails, HOB elevated, Min assist Sit to supine: Mod assist, +2 for safety/equipment Sit to sidelying: Mod assist, Used rails General bed mobility comments: Pt requires 1 step cues to sequence bed mobility with reinforcement of back alignment to avoid twisting. HOB ~45 deg prior to transfer which seemed to help with lifting. Bed rail also used PRN. Transfers Overall transfer level: Needs assistance Equipment used: Rolling walker (2 wheels) Transfers: Sit to/from Stand Sit to Stand: Mod assist, +2 safety/equipment, From elevated surface Bed  to/from chair/wheelchair/BSC transfer type:: Step pivot Step pivot transfers: Mod assist, +2 safety/equipment General transfer comment: Bed elevated ~4 prior to STS trial. ModA +1 lift assist, +2 safety with NT standing by. Pt need repetition for reinforcement of safety with RW as pt tending to let go of RW handle prior to reaching [facility] with bowel urgency expressed multiple times. Primofit stayed plugged in with urination through primofit while pivoting to [facility]. Pt cued to push from bed with LUE as he prefers single UE on RW handle, pt  also instructed on no bending past 90 deg before trial with good carryover. Cues to avoid twisting while pivoting needed and tactile/vcs where to place hands with stand>sit. Ambulation/Gait Ambulation/Gait assistance: Min assist, Mod assist, +2 physical assistance Gait Distance (Feet): 3 Feet Assistive device: Rolling walker (2 wheels) Gait Pattern/deviations: Decreased stride length, Decreased dorsiflexion - right, Shuffle, Trunk flexed, Step-to pattern General Gait Details: shuffled steps to [facility]; bowel urgency so not able to progress further distance. Gait velocity: decreased Gait velocity interpretation: <1.31 ft/sec, indicative of household ambulator Pre-gait activities: Engineer, water march, limited, mod assist.   ADL: ADL Overall ADL's : Needs assistance/impaired Eating/Feeding: Set up Grooming: Set up, Sitting Grooming Details (indicate cue type and reason): limited by A line in dominant hand, asking RN and staff to assist with hand to mouth bc of this Upper Body Bathing: Moderate assistance Lower Body Bathing: Maximal assistance Upper Body Dressing : Maximal assistance Upper Body Dressing Details (indicate cue type and reason): doffing brace Lower Body Dressing: Maximal assistance Lower Body Dressing Details (indicate cue type and reason): doff socks, unable to perform figure 4 Toilet Transfer: +2 for physical assistance, Moderate assistance (sit <> stand at  eob with need to return to sitting for bed pan. pt voiding and having sensation of void starting) Toilet Transfer Details (indicate cue type and reason): face to face simulated through return to bed Toileting- Clothing Manipulation and Hygiene: Total assistance Toileting - Clothing Manipulation Details (indicate cue type and reason): diarrhea Functional mobility during ADLs: Moderate assistance (1 person HHA/face to face) General ADL Comments: pt completed bed mobility, eob sitting with knee extension and sit<> stand x3. pt with incontinence of stool initially. pt noted to have skin break down   Cognition: Cognition Orientation Level: Oriented X4 Cognition Arousal: Alert Behavior During Therapy: WFL for tasks assessed/performed   Physical Exam: Blood pressure (!) 132/57, pulse 77, temperature 98.4 F (36.9 C), temperature source Oral, resp. rate 18, height 5' 11 (1.803 m), weight 75.2 kg, SpO2 93%.   General: No apparent distress HEENT: Head is normocephalic, atraumatic, sclera anicteric, oral mucosa pink and moist, wearing glasses Heart: RRR Chest: CTA bilaterally without wheezes, rales, or rhonchi; no distress, on room air  Abdomen: Soft, non-tender, non-distended, bowel sounds positive. Extremities: no edema Psych: Pt's affect is appropriate. Pt is cooperative Skin: Bruising on b/l UE, Incision with dressing CDI, foam dressing both feet Neuro:     Mental Status: AAOx4, memory grossly intact Speech/Languate: fluent, follows commands CRANIAL NERVES:2-12  intact     MOTOR: RUE: 4+/5 Deltoid,4+/5 Biceps, 4+/5 Triceps,4+/5 Grip LUE: 4+/5 Deltoid, 4+/5 Biceps, 4+/5 Triceps, 4+/5 Grip RLE: HF 4-/5, KE 4+/5, ADF 0/5, APF 4/5 LLE: HF 4-/5, KE 4+/5, ADF 4/5, APF 4/5     REFLEXES: NO ankle clonus   SENSORY: Normal to touch all 4 extremities   Coordination: No UE ataxia/dysmetria noted   Picc R arm, IV L arm     Lab Results Last 48 Hours        Results for orders placed or  performed during the hospital encounter of 09/03/23 (from the past 48 hours)  Heparin  level (unfractionated)     Status: Abnormal    Collection Time: 09/08/23  6:30 PM  Result Value Ref Range    Heparin  Unfractionated 0.17 (L) 0.30 - 0.70 IU/mL      Comment: (NOTE) The clinical reportable range upper limit is being lowered to >1.10 to align with the FDA approved guidance for the current laboratory assay.  If heparin  results are below expected values, and patient dosage has  been confirmed, suggest follow up testing of antithrombin III levels. Performed at [facility], [address] [place], [place] [address]    CBC     Status: Abnormal    Collection Time: 09/09/23  4:54 AM  Result Value Ref Range    WBC 8.2 4.0 - 10.5 K/uL    RBC 2.55 (L) 4.22 - 5.81 MIL/uL    Hemoglobin 7.8 (L) 13.0 - 17.0 g/dL    HCT 75.7 (L) 60.9 - 52.0 %    MCV 94.9 80.0 - 100.0 fL    MCH 30.6 26.0 - 34.0 pg    MCHC 32.2 30.0 - 36.0 g/dL    RDW 84.8 88.4 - 84.4 %    Platelets 192 150 - 400 K/uL    nRBC 0.0 0.0 - 0.2 %      Comment: Performed at [facility], [address], [place], [place] [address]  Magnesium      Status: None    Collection Time: 09/09/23  4:54 AM  Result Value Ref Range    Magnesium  2.2 1.7 - 2.4 mg/dL      Comment: Performed at [facility], [address], [place], [place] [address]  Phosphorus     Status: Abnormal    Collection Time: 09/09/23  4:54 AM  Result Value Ref Range    Phosphorus 2.3 (L) 2.5 - 4.6 mg/dL      Comment: Performed at [facility], [address] [place], [place] [address]  Comprehensive metabolic panel     Status: Abnormal    Collection Time: 09/09/23  4:54 AM  Result Value Ref Range    Sodium 137 135 - 145 mmol/L    Potassium 4.3 3.5 - 5.1 mmol/L    Chloride 102 98 - 111 mmol/L    CO2 26 22 - 32 mmol/L    Glucose, Bld 96 70 - 99 mg/dL      Comment: Glucose reference range applies only to samples taken after fasting for at  least 8 hours.    BUN 13 8 - 23 mg/dL    Creatinine, Ser 8.99 0.61 - 1.24 mg/dL    Calcium  8.4 (L) 8.9 - 10.3 mg/dL    Total Protein 5.2 (L) 6.5 - 8.1 g/dL    Albumin  2.6 (L) 3.5 - 5.0 g/dL    AST 29 15 - 41 U/L    ALT 10 0 - 44 U/L    Alkaline Phosphatase 61 38 - 126 U/L    Total Bilirubin 0.6 0.0 - 1.2 mg/dL    GFR, Estimated >39 >39 mL/min      Comment: (NOTE) Calculated using the CKD-EPI Creatinine Equation (2021)      Anion gap 9 5 - 15      Comment: Performed at [facility], [address] [place], [place] [address]  Heparin  level (unfractionated)     Status: None    Collection Time: 09/09/23  4:54 AM  Result Value Ref Range    Heparin  Unfractionated 0.33 0.30 - 0.70 IU/mL      Comment: (NOTE) The clinical reportable range upper limit is being lowered to >1.10 to align with the FDA approved guidance for the current laboratory assay.   If heparin  results are below expected values, and patient dosage has  been confirmed, suggest follow up testing of antithrombin III levels. Performed at [facility], [address] [place] [address] [place], [place] [address]    Heparin  level (unfractionated)     Status: None    Collection Time: 09/09/23  1:00 PM  Result Value Ref Range    Heparin  Unfractionated 0.33 0.30 - 0.70 IU/mL      Comment: (NOTE) The clinical reportable range upper limit is being lowered to >1.10 to align with the FDA approved guidance for the current laboratory assay.   If heparin  results are below expected values, and patient dosage has  been confirmed, suggest follow up testing of antithrombin III levels. Performed at [facility], [address] [place], [place] [address]        Imaging Results (Last 48 hours)  No results found.         Blood pressure (!) 132/57, pulse 77, temperature 98.4 F (36.9 C), temperature source Oral, resp. rate 18, height 5' 11 (1.803 m), weight 75.2 kg, SpO2 93%.   Medical Problem List and Plan: 1. Functional  deficits secondary to L1 burst fracture with cord compression status post T10-L4 fusion with transpedicular decompression L1.  Lumbar corset when out of bed applied in sitting position             -patient may shower             -ELOS/Goals: 10-12 days, PT/OT mina/sup             -Admit to CIR 2.  Antithrombotics: -DVT/anticoagulation:  Pharmaceutical: Heparin  transitioning back to Xarelto              -antiplatelet therapy: N/A 3. Pain Management: Tramadol /hydrocodone  as needed, Robaxin  500 mg 4 times daily 4. Mood/Behavior/Sleep: Provide emotional support             -antipsychotic agents: N/A 5. Neuropsych/cognition: This patient is capable of making decisions on his own behalf. 6. Skin/Wound Care: Routine skin checks 7. Fluids/Electrolytes/Nutrition: Routine in and outs with follow-up chemistries 8.  Acute blood loss anemia.  Follow-up CBC 9.  Shock/narrow complex tachycardia with hypotension.  Resolved.  Follow-up cardiology services 10.  Chronic systolic congestive heart failure/nonischemic CMP.  Demadex  20 mg daily, Aldactone  25 mg daily, Farxiga  10 mg daily.  Monitor for any fluid overload. Daily weights 11.  CAD.  Toprol -XL 12.5 mg daily.  Monitor with increased mobility 12.  Atrial fibrillation.  Transitioning from IV heparin  back to Xarelto .  Continue amiodarone  200 mg twice daily for now.  Continue metoprolol  13.  CKD stage III.  Follow-up chemistries.  Avoid nephrotoxic medications. 14.  Hyperlipidemia.  Crestor  15.  History of prostate cancer.  Flomax  0.4 mg daily. 16.  Constipation.  MiraLAX  twice daily             - LBM 9/2, continent 17.R foot drop-chronic, has AFO     Toribio JINNY Pitch, PA-C 09/10/2023   I have personally performed a face to face diagnostic evaluation of this patient and formulated the key components of the plan.  Additionally, I have personally reviewed laboratory data, imaging studies, as well as relevant notes and concur with the physician assistant's  documentation above.   The patient's status has not changed from the original H&P.  Any changes in documentation from the acute care chart have been noted above.   Murray Collier, MD

## 2023-09-10 NOTE — Progress Notes (Signed)
 Inpatient Rehab Admissions Coordinator:    I have a CIR bed for this Pt.  RN may call report to [phone].  Pt. To admit to CIR for 10-12 days with the goal of reaching supervision to min A goals and discharging home with assistance from his wife.   Leita Kleine, MS, CCC-SLP Rehab Admissions Coordinator  [phone] (celll) [phone] (office)

## 2023-09-10 NOTE — Plan of Care (Signed)
  Problem: Education: Goal: Knowledge of General Education information will improve Description: Including pain rating scale, medication(s)/side effects and non-pharmacologic comfort measures Outcome: Progressing   Problem: Health Behavior/Discharge Planning: Goal: Ability to manage health-related needs will improve Outcome: Progressing   Problem: Activity: Goal: Risk for activity intolerance will decrease Outcome: Progressing   Problem: Nutrition: Goal: Adequate nutrition will be maintained Outcome: Progressing   Problem: Coping: Goal: Level of anxiety will decrease Outcome: Progressing   Problem: Elimination: Goal: Will not experience complications related to bowel motility Outcome: Progressing Goal: Will not experience complications related to urinary retention Outcome: Progressing   Problem: Pain Managment: Goal: General experience of comfort will improve and/or be controlled Outcome: Progressing   Problem: Safety: Goal: Ability to remain free from injury will improve Outcome: Progressing   Problem: Skin Integrity: Goal: Risk for impaired skin integrity will decrease Outcome: Progressing   Problem: Education: Goal: Ability to verbalize activity precautions or restrictions will improve Outcome: Progressing Goal: Knowledge of the prescribed therapeutic regimen will improve Outcome: Progressing Goal: Understanding of discharge needs will improve Outcome: Progressing   Problem: Activity: Goal: Ability to avoid complications of mobility impairment will improve Outcome: Progressing Goal: Ability to tolerate increased activity will improve Outcome: Progressing Goal: Will remain free from falls Outcome: Progressing   Problem: Bowel/Gastric: Goal: Gastrointestinal status for postoperative course will improve Outcome: Progressing   Problem: Clinical Measurements: Goal: Ability to maintain clinical measurements within normal limits will improve Outcome:  Progressing Goal: Postoperative complications will be avoided or minimized Outcome: Progressing Goal: Diagnostic test results will improve Outcome: Progressing   Problem: Pain Management: Goal: Pain level will decrease Outcome: Progressing   Problem: Skin Integrity: Goal: Will show signs of wound healing Outcome: Progressing   Problem: Health Behavior/Discharge Planning: Goal: Identification of resources available to assist in meeting health care needs will improve Outcome: Progressing   Problem: Bladder/Genitourinary: Goal: Urinary functional status for postoperative course will improve Outcome: Progressing

## 2023-09-11 ENCOUNTER — Inpatient Hospital Stay (HOSPITAL_COMMUNITY)

## 2023-09-11 DIAGNOSIS — I4891 Unspecified atrial fibrillation: Secondary | ICD-10-CM

## 2023-09-11 DIAGNOSIS — I5022 Chronic systolic (congestive) heart failure: Secondary | ICD-10-CM | POA: Diagnosis not present

## 2023-09-11 DIAGNOSIS — G8222 Paraplegia, incomplete: Principal | ICD-10-CM

## 2023-09-11 DIAGNOSIS — S32001A Stable burst fracture of unspecified lumbar vertebra, initial encounter for closed fracture: Secondary | ICD-10-CM

## 2023-09-11 DIAGNOSIS — I351 Nonrheumatic aortic (valve) insufficiency: Secondary | ICD-10-CM

## 2023-09-11 LAB — CBC WITH DIFFERENTIAL/PLATELET
Abs Immature Granulocytes: 0.16 K/uL — ABNORMAL HIGH (ref 0.00–0.07)
Basophils Absolute: 0 K/uL (ref 0.0–0.1)
Basophils Relative: 0 %
Eosinophils Absolute: 0 K/uL (ref 0.0–0.5)
Eosinophils Relative: 0 %
HCT: 27.6 % — ABNORMAL LOW (ref 39.0–52.0)
Hemoglobin: 9.1 g/dL — ABNORMAL LOW (ref 13.0–17.0)
Immature Granulocytes: 2 %
Lymphocytes Relative: 11 %
Lymphs Abs: 0.9 K/uL (ref 0.7–4.0)
MCH: 31 pg (ref 26.0–34.0)
MCHC: 33 g/dL (ref 30.0–36.0)
MCV: 93.9 fL (ref 80.0–100.0)
Monocytes Absolute: 0.8 K/uL (ref 0.1–1.0)
Monocytes Relative: 9 %
Neutro Abs: 6.4 K/uL (ref 1.7–7.7)
Neutrophils Relative %: 78 %
Platelets: 306 K/uL (ref 150–400)
RBC: 2.94 MIL/uL — ABNORMAL LOW (ref 4.22–5.81)
RDW: 15.3 % (ref 11.5–15.5)
WBC: 8.3 K/uL (ref 4.0–10.5)
nRBC: 0 % (ref 0.0–0.2)

## 2023-09-11 LAB — COMPREHENSIVE METABOLIC PANEL WITH GFR
ALT: 14 U/L (ref 0–44)
AST: 38 U/L (ref 15–41)
Albumin: 2.9 g/dL — ABNORMAL LOW (ref 3.5–5.0)
Alkaline Phosphatase: 84 U/L (ref 38–126)
Anion gap: 10 (ref 5–15)
BUN: 13 mg/dL (ref 8–23)
CO2: 28 mmol/L (ref 22–32)
Calcium: 9.1 mg/dL (ref 8.9–10.3)
Chloride: 98 mmol/L (ref 98–111)
Creatinine, Ser: 1.3 mg/dL — ABNORMAL HIGH (ref 0.61–1.24)
GFR, Estimated: 57 mL/min — ABNORMAL LOW (ref >60)
Glucose, Bld: 196 mg/dL — ABNORMAL HIGH (ref 70–99)
Potassium: 3.9 mmol/L (ref 3.5–5.1)
Sodium: 136 mmol/L (ref 135–145)
Total Bilirubin: 0.7 mg/dL (ref 0.0–1.2)
Total Protein: 6.1 g/dL — ABNORMAL LOW (ref 6.5–8.1)

## 2023-09-11 MED ORDER — CHLORHEXIDINE GLUCONATE CLOTH 2 % EX PADS
6.0000 | MEDICATED_PAD | Freq: Every day | CUTANEOUS | Status: DC
  Administered 2023-09-10: 6 via TOPICAL

## 2023-09-11 MED ORDER — GADOBUTROL 1 MMOL/ML IV SOLN
10.0000 mL | Freq: Once | INTRAVENOUS | Status: AC | PRN
  Administered 2023-09-11: 10 mL via INTRAVENOUS

## 2023-09-11 MED ORDER — CHLORHEXIDINE GLUCONATE CLOTH 2 % EX PADS
6.0000 | MEDICATED_PAD | Freq: Two times a day (BID) | CUTANEOUS | Status: DC
  Administered 2023-09-11 – 2023-09-15 (×9): 6 via TOPICAL

## 2023-09-11 NOTE — Progress Notes (Signed)
 Patient ID: Aaron Richmond, male   DOB: 1945-03-29, 78 y.o.   MRN: [id number]     Advanced Heart Failure Rounding Note  Cardiologist: None  Chief Complaint: CHF Subjective:    No complaints today, back pain is improving. SBP 100s-110s, still with wide pulse pressure.    Objective:   Weight Range: 75 kg Body mass index is 23.06 kg/m.   Vital Signs:   Temp:  [98.1 F (36.7 C)-98.3 F (36.8 C)] 98.3 F (36.8 C) (09/04 0431) Pulse Rate:  [68-83] 68 (09/04 0431) Resp:  [16-20] 16 (09/04 0431) BP: (113-132)/(45-57) 113/50 (09/04 0431) SpO2:  [93 %-95 %] 94 % (09/04 0431) Weight:  [75 kg] 75 kg (09/03 1708) Last BM Date : 09/09/23  Weight change: Filed Weights   09/10/23 1708  Weight: 75 kg    Intake/Output:   Intake/Output Summary (Last 24 hours) at 09/11/2023 0815 Last data filed at 09/11/2023 0438 Gross per 24 hour  Intake 240 ml  Output 1500 ml  Net -1260 ml      Physical Exam    General:  Well appearing. No resp difficulty HEENT: Normal Neck: Supple. JVP 8 cm. Carotids 2+ bilat; no bruits. No lymphadenopathy or thyromegaly appreciated. Cor: PMI nondisplaced. Irregular rate & rhythm. 1/ SEM RUSB. Lungs: Clear Abdomen: Soft, nontender, nondistended. No hepatosplenomegaly. No bruits or masses. Good bowel sounds. Extremities: No cyanosis, clubbing, rash, edema Neuro: Alert & orientedx3, cranial nerves grossly intact. moves all 4 extremities w/o difficulty. Affect pleasant   Telemetry   Not on telemetry  EKG    NSR yesterday (personally reviewed)  Labs    CBC Recent Labs    09/09/23 0454 09/10/23 1030  WBC 8.2 10.2  HGB 7.8* 8.7*  HCT 24.2* 26.6*  MCV 94.9 94.3  PLT 192 252   Basic Metabolic Panel Recent Labs    90/97/74 0454 09/10/23 1030  NA 137 131*  K 4.3 4.0  CL 102 97*  CO2 26 24  GLUCOSE 96 118*  BUN 13 14  CREATININE 1.00 1.06  CALCIUM  8.4* 8.7*  MG 2.2 2.1  PHOS 2.3* 2.7   Liver Function Tests Recent Labs     09/09/23 0454  AST 29  ALT 10  ALKPHOS 61  BILITOT 0.6  PROT 5.2*  ALBUMIN  2.6*   No results for input(s): LIPASE, AMYLASE in the last 72 hours. Cardiac Enzymes No results for input(s): CKTOTAL, CKMB, CKMBINDEX, TROPONINI in the last 72 hours.  BNP: BNP (last 3 results) No results for input(s): BNP in the last 8760 hours.  ProBNP (last 3 results) Recent Labs    11/08/22 1623  PROBNP 1,382*     D-Dimer No results for input(s): DDIMER in the last 72 hours. Hemoglobin A1C No results for input(s): HGBA1C in the last 72 hours. Fasting Lipid Panel No results for input(s): CHOL, HDL, LDLCALC, TRIG, CHOLHDL, LDLDIRECT in the last 72 hours. Thyroid  Function Tests No results for input(s): TSH, T4TOTAL, T3FREE, THYROIDAB in the last 72 hours.  Invalid input(s): FREET3  Other results:   Imaging    DG Foot 2 Views Right Result Date: 09/10/2023 CLINICAL DATA:  96001 Foot pain 03998 EXAM: RIGHT FOOT - 2 VIEW COMPARISON:  None Available. FINDINGS: Diffuse osteopenia. Moderate hallux valgus deformity.No acute fracture or dislocation. There is no evidence of arthropathy or other focal bone abnormality. Diffuse peripheral vascular atherosclerosis. No radiopaque foreign body. IMPRESSION: Diffuse osteopenia.  No acute fracture or dislocation. Electronically Signed   By: Rogelia Carlean HERO.D.  On: 09/10/2023 16:09     Medications:     Scheduled Medications:  amiodarone   200 mg Oral BID   bisacodyl   10 mg Rectal Daily   Chlorhexidine  Gluconate Cloth  6 each Topical Daily   cyanocobalamin   5,000 mcg Oral Q1200   dapagliflozin  propanediol  10 mg Oral Daily   methocarbamol   500 mg Oral QID   metoprolol  succinate  12.5 mg Oral BID   multivitamin with minerals  1 tablet Oral Daily   pantoprazole   40 mg Oral Daily   polyethylene glycol  17 g Oral BID   potassium chloride  SA  20 mEq Oral Daily   rivaroxaban   20 mg Oral Q supper   rosuvastatin    10 mg Oral Daily   spironolactone   25 mg Oral Daily   tamsulosin   0.4 mg Oral Daily   torsemide   20 mg Oral Daily    Infusions:   PRN Medications: acetaminophen  **OR** acetaminophen , alum & mag hydroxide-simeth, HYDROcodone -acetaminophen , ondansetron  **OR** ondansetron  (ZOFRAN ) IV, mouth rinse, polyethylene glycol, traMADol      Assessment/Plan   1. Shock: Resolved. Patient was on NE x 3 days post-op.  He has a wide pulse pressure likely due to his moderate AI.  BP is very stable off NE, SBP 100-110s.  Echo is stable compared to prior, EF 30-35%, mild RV dysfunction, ascending aorta 56 mm with moderate AI.  Prior echo similar with EF 35-40%, moderate AI.  - Would aim for systolic BP >/= 100 rather than using MAP.   2. Chronic systolic CHF: Nonischemic CMP.  Echo showed EF 30-35%, mild RV dysfunction, ascending aorta 56 mm with moderate AI. Nonischemic cardiomyopathy, coronary CTA in 7/25 showed extensive but nonobstructive CAD.  Cardiomyopathy may be due to permanent AF, ?elevated HR at times.Would like to keep HR better-controlled given concern for tachy-mediated CMP.  Has actually been in NSR on amiodarone .  He does not look significantly volume overloaded. I do not think he has BP room to titrate GDMT (limited by orthostasis).  - Continue torsemide  20 mg daily - Continue Toprol  XL 12.5 mg BID.  - Continue spironolactone  to 25 mg daily.   - Continue Farxiga  10 mg daily - He thinks he could do a cardiac MRI, I will order today.  3. Atrial fibrillation: Though to be permanent.  Did not cardiovert to NSR when shocked for SVT. ECGs 9/2 and 9/3 showed NSR with PACs and PVCs. Pulse irregular again today.  - Will get ECG today to check rhythm.  - Continue amiodarone  200 mg bid. Transition to 200 mg daily at discharge.  - Continue Toprol  XL.   - Continue Xarelto .  4. SVT: Patient had rapid SVT post-op, required DCCV.  Now on amiodarone .  - Would continue amiodarone  200 mg bid for now,  transition to daily at discharge.  5. CAD: Nonobstructive but extensive CAD on coronary CTA.  - Continue statin.  - No ASA with anticoagulation.  6. AKI: Resolved. Creatinine up to 1.6 initially.  Now down to 1.0. 7. Dilated aortic root/ascending aorta with moderate AI: 56 mm ascending aorta on echo, this is roughly stable from last CTA.   - Close followup with [facility] as outpatient.  - Moderate AI is likely causing the wide pulse pressure.  8. S/p decompression L1, fusion T10-L4: Per neurosurgery.  Needs PT.  - Graded compression stockings to help with orthostasis.   We will follow at a distance.   Length of Stay: 1  Ezra Shuck, MD  09/11/2023, 8:15  AM  Advanced Heart Failure Team Pager [phone] (M-F; 7a - 5p)  Please contact [facility] for night-coverage after hours (5p -7a ) and weekends on [email or web address]

## 2023-09-11 NOTE — Evaluation (Signed)
 Physical Therapy Assessment and Plan  Patient Details  Name: Aaron Richmond MRN: [id number] Date of Birth: 08-Jul-1945  PT Diagnosis: Low back pain and Muscle weakness Rehab Potential: Good ELOS: 10 days   Today's Date: 09/11/2023 PT Individual Time: 1030-1130, 8592-8548 PT Individual Time Calculation (min): 60 min, 44 min    Hospital Problem: Principal Problem:   Acute incomplete paraplegia (HCC) Active Problems:   Lumbar burst fracture (HCC)   Past Medical History:  Past Medical History:  Diagnosis Date   Aneurysm of aortic root    Overview:  Last Assessment & Plan:  Planning to see Dr Army for evaluation of 5cm aneurysm.  - will check PFT in prep for possible procedure / SGY   Aortic atherosclerosis (HCC) 06/14/2022   Aortic regurgitation    Aortic valve insufficiency    Ascending aortic aneurysm (HCC) 09/11/2016   Atherosclerotic vascular disease 05/27/2022   Atrial fibrillation (HCC)    Atrial fibrillation with RVR (HCC) 07/05/2022   BMI 27.0-27.9,adult 06/14/2022   BPH (benign prostatic hyperplasia)    BRBPR (bright red blood per rectum) 05/20/2023   Cardiomyopathy (HCC) 09/11/2016   CHF (congestive heart failure) (HCC)    CKD (chronic kidney disease) stage 2, GFR 60-89 ml/min 11/23/2021   Closed fracture of right hip (HCC) 03/10/2023   Coronary artery calcification seen on CT scan 10/13/2017   Dysrhythmia    Essential hypertension 06/14/2022   Gastrointestinal hemorrhage 05/20/2023   Heart murmur    High risk medication use 07/31/2022   History of cardiomyopathy 11/09/2021   History of pulmonary embolism 09/24/2012   Overview:  Last Assessment & Plan:  Hx PE x 2 per notes, ? 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  - needs auto-titration study by [employer] or local company, data to RB to adjust his home device.    OSA (obstructive sleep apnea) 09/13/2022   Prediabetes    Primary osteoarthritis involving multiple joints 06/14/2022   Prostate cancer metastatic to bone (HCC) 06/14/2022   Pulmonary embolism (HCC)    Rhinitis    Sacral wound 04/11/2023   Seasonal allergies 06/14/2022   Spondylolisthesis of lumbar region 12/12/2021   Stage 3a chronic kidney disease (HCC) 06/14/2022   Unstageable pressure ulcer of sacral region (HCC) 03/10/2023   Past Surgical History:  Past Surgical History:  Procedure Laterality Date   APPENDECTOMY     COLONOSCOPY N/A 05/23/2023   Procedure: COLONOSCOPY;  Surgeon: Aaron Claw, MD;  Location: [facility];  Service: Gastroenterology;  Laterality: N/A;   HERNIA REPAIR     LAMINECTOMY WITH POSTERIOR LATERAL ARTHRODESIS LEVEL 4 N/A 09/03/2023   Procedure: Posterior lateral fusion - Thoracic Ten-Thoracic Eleven - Thoracic Eleven -Thoracic Twelve - Thoracic Twelve-Lumbar One - Lumbar One-Lumbar Two - Lumbar Two-Lumbar Three - Lumbar Three-Lumbar Four, laminectomy and foraminotomy transpedicular decompression Lumbar One;  Surgeon: Aaron Kuba, MD;  Location: [facility] OR;  Service: Neurosurgery;  Laterality: N/A;  Posterior lateral  fusion - T10-T11    LUMBAR LAMINECTOMY  02/06/2022   Facetectom & foraminotomy for decompression of the cauda equina & nerve root L4-5, Aaron PHEBE Dark, MD   TONSILLECTOMY      Assessment & Plan Clinical Impression: Aaron Willette. Richmond is a 78 year old right-handed male with history significant for ascending aortic  aneurysm, atrial fibrillation/cardiomyopathy/idiopathic medial aortopathy maintained on Xarelto , mitral valve insufficiency, metastatic prostate cancer, hypertension, hyperlipidemia, OSA, CKD stage III, prediabetes, systolic congestive heart failure and lumbar laminectomy 02/06/2022. Per chart review patient lives with spouse. 1 level home one-step to entry. Ambulates with a rolling walker and right lower extremity AFO. Presented 09/03/2023 with back and right leg pain numbness tingling in her legs. Workup revealed severe cord compression at L1 marked spondylosis at that level recommendations of transpedicular decompression of L1 and posterior lateral instrumented fusion. Patient underwent T10-L4 fusion by Dr. Onetha 09/03/2023 with posterior lateral arthrodesis T10-11, T11-12, T12-L1, L1-L2, L2-L3, L3-L4. Placed in a lumbar corset applied in sitting position. Hospital course patient developed narrow complex tachycardia with hypotension, initially failed cardioversion attempts but later converted to A-fib RVR with 360 J shock with improved pressures followed by cardiology services. Patient treated with amiodarone  and pressors for hypotension. Initially Xarelto  held for procedure patient currently transition to IV heparin  awaiting plan to resume Xarelto . Hospital course acute blood loss anemia 7.8 and monitored. Hx of foot drop RLE, has AFO. Reports had recent EMG/NCS outpatient. Reports he has been continent of bowel and bladder. Therapy evaluations completed due to patient's decreased functional mobility was admitted for a comprehensive rehab program. Pt transferred to [facility] on 09/10/2023.  Patient currently requires min with mobility secondary to muscle weakness, decreased cardiorespiratoy endurance, impaired timing and sequencing, motor apraxia, and decreased coordination, and decreased balance strategies.  Prior to hospitalization, patient was modified independent  with mobility and lived with Spouse in a House  home.  Home access is  Stairs to enter (sunken den 2 steps down to get to bed/bath. kitchen up 2 steps.).  Patient will benefit from skilled PT intervention to maximize safe functional mobility, minimize fall risk, and decrease caregiver burden for planned discharge home with intermittent assist.  Anticipate patient will benefit from follow up OP at discharge.  PT Assessment Rehab Potential (ACUTE/IP ONLY): Good PT Barriers to Discharge: Home environment access/layout PT Patient demonstrates impairments in the following area(s): Balance;Endurance;Motor;Pain;Safety PT Transfers Functional Problem(s): Bed to Chair;Bed Mobility;Car PT Locomotion Functional Problem(s): Ambulation PT Plan PT Intensity: Minimum of 1-2 x/day ,45 to 90 minutes PT Frequency: 5 out of 7 days PT Duration Estimated Length of Stay: 10 days PT Treatment/Interventions: Community reintegration;Ambulation/gait training;Balance/vestibular training;Discharge planning;Functional electrical stimulation;Pain management;Stair training;Neuromuscular re-education;DME/adaptive equipment instruction;Psychosocial support;UE/LE Strength taining/ROM;Wheelchair propulsion/positioning;UE/LE Coordination activities;Therapeutic Activities;Splinting/orthotics;Therapeutic Exercise;Patient/family education;Functional mobility training;Disease management/prevention;Cognitive remediation/compensation;Skin care/wound management PT Recommendation Recommendations for Other Services: Neuropsych consult;Therapeutic Recreation consult Therapeutic Recreation Interventions: Stress management;Outing/community reintergration Follow Up Recommendations: Outpatient PT Patient destination: Home Equipment Recommended: To be determined Equipment Details: owns RW   PT Evaluation Precautions/Restrictions Restrictions Weight Bearing Restrictions Per Provider Order: No General   Vital Signs  Pain Pain Assessment Pain Scale: 0-10 Pain Score: 7  Pain  Location: Back Pain Intervention(s): Medication (See eMAR);Repositioned Pain Interference Pain Interference Pain Effect on Sleep: 2. Occasionally Pain Interference with Therapy Activities: 0. Does not apply - I have not received rehabilitationtherapy in the past 5 days Pain Interference with Day-to-Day Activities: 1. Rarely or not at all Home Living/Prior Functioning Home Living Available Help at Discharge: Family;Available  24 hours/day Type of Home: House Home Access: Stairs to enter (sunken den 2 steps down to get to bed/bath. kitchen up 2 steps.) Entrance Stairs-Rails:  (B rails, uses 1 rail. 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) Vision/Perception  Vision - History Ability to See in Adequate Light: 0 Adequate Vision - Assessment Additional Comments: at baseline Perception Perception: Within Functional Limits Praxis Praxis: WFL  Cognition Overall Cognitive Status: Within Functional Limits for tasks assessed Arousal/Alertness: Awake/alert Orientation Level: Oriented X4 Sensation Sensation Light Touch: Appears Intact Motor  Motor Motor: Within Functional Limits   Trunk/Postural Assessment  Cervical Assessment Cervical Assessment: Within Functional Limits Thoracic Assessment Thoracic Assessment: Exceptions to [facility] Lumbar Assessment Lumbar Assessment: Exceptions to [facility] Postural Control Postural Control: Deficits on evaluation  Balance Dynamic Sitting Balance Sitting balance - Comments: EOB CGA to SBA unsupported Extremity Assessment      RLE Assessment RLE Assessment: Within Functional Limits General Strength Comments: globally 5/5  except hip flexion 4/5, DF 0/5 LLE Assessment LLE Assessment: Within Functional Limits General Strength Comments: 5/5 except hip flexion 4/5, DF 3-/5  Care Tool Care Tool Bed Mobility Roll left and right activity   Roll left and right assist level: Minimal Assistance - Patient > 75%    Sit to lying activity   Sit to lying assist level: Moderate Assistance - Patient 50 - 74%    Lying to sitting on side of bed activity   Lying to sitting on side of bed assist level: the ability to move from lying on the back to sitting on the side of the bed with no back support.: Moderate Assistance - Patient 50 - 74%     Care Tool Transfers Sit to stand transfer   Sit to stand assist level: Minimal Assistance - Patient > 75%    Chair/bed transfer   Chair/bed transfer assist level: Minimal Assistance - Patient > 75%    Car transfer   Car transfer assist level: Contact Guard/Touching assist      Care Tool Locomotion Ambulation   Assist level: Minimal Assistance - Patient > 75% Assistive device: Walker-rolling    Walk 10 feet activity   Assist level: Minimal Assistance - Patient > 75% Assistive device: Walker-rolling   Walk 50 feet with 2 turns activity    Assist level: Minimal Assistance - Patient > 75% Assistive device: Walker-rolling  Walk 150 feet activity  Walk 150 feet activity did not occur: Safety/medical concerns      Walk 10 feet on uneven surfaces activity Walk 10 feet on uneven surfaces activity did not occur: Safety/medical concerns      Stairs   Assist level: Moderate Assistance - Patient - 50 - 74% Stairs assistive device: 2 hand rails Max number of stairs: 4  Walk up/down 1 step activity Walk up/down 1 step assist level: Moderate Assistance - Patient - 50-74% Walk up/down 1 step assistive device: 2 hand rails       Walk up/down 4 steps activity   Walk up/down 4 steps assist level: Moderate Assistance - Patient - 50 - 74% Walk up/down 4 steps assistive device: 2  hand rails  Walk up/down 12 steps activity Walk up/down 12 steps activity did not occur: Safety/medical concerns  Pick up small objects from floor  Minimal Assistance - Patient > 75%      Wheelchair Is the patient using a wheelchair?: Yes Type of Wheelchair: Manual   Wheelchair assist level: Supervision/Verbal cueing Max wheelchair distance: 150  Wheel 50 feet with 2 turns activity   Assist Level: Contact Guard/Touching assist  Wheel 150 feet activity   Assist Level: Contact Guard/Touching assist    Refer to Care Plan for Long Term Goals  SHORT TERM GOAL WEEK 1 PT Short Term Goal 1 (Week 1): Pt will complete 3 consecutive sit to stand transfers w/ CGA PT Short Term Goal 2 (Week 1): Pt will perform 4 stairs w/ one handrail and side-stepping w/ minA PT Short Term Goal 3 (Week 1): Pt will demonstrate proper technique for sit to stand, using BUE to push off WC and not RW, 3 consecutive transfers in a row.  Recommendations for other services: Neuropsych and Therapeutic Recreation  Stress management and Outing/community reintegration  Skilled Therapeutic Intervention Mobility Transfers Transfers: Sit to Stand;Stand to Sit Sit to Stand: Minimal Assistance - Patient > 75% Stand to Sit: Minimal Assistance - Patient > 75% Transfer (Assistive device): Science writer / Additional Locomotion Stairs: Yes Stairs Assistance: Moderate Assistance - Patient 50 - 74% Stair Management Technique: Two rails;Step to pattern Number of Stairs: 4 Height of Stairs: 6 Ramp: Minimal Assistance - Patient >75% Wheelchair Mobility Wheelchair Mobility: Yes Wheelchair Assistance: Doctor, general practice: Both upper extremities Distance: 11ft  AM Session: Evaluation began educating on PT POC and goals, pt responsibility and daily schedule. Pt agreeable to eval and wife present in room. Pt completed stairs w/ mod A and 2 handrails demonstrating alternating  steps initially then regressing to step to. Pt completed car transfer w/ supervision, CGA to/from car, and ramp ambulation before self-propelling WC ~155ft w/ supervision to room at end of session. Pt presents w/ R AFO from [employer], R foot drop, L foot decreased clearance during gait, back precautions, and heightened fear of falling. Pt motivated to get better but needs minimal encouragement to participate and initiate therapy. Pt remained upright in manual WC in room at end of session w/ call bell and all other needs in reach.   PM Session: Pt wheeled to main gym hallway and ambulated ~137ft w/ RW and minA. Pt demonstrated mildly uncoordinated, intermittent step-through gait w/ R step-to mixed in as well. Pt w/ fear of falling, encouraged to continue ambulating towards ortho gym. Pt w/ one instance of R toe catching, required modA to remain upright. Pt demonstrates forward trunk lean and difficulty maintaining RW closer to self, even w/ cues to do so. Pt states he'd feel more comfortable if the RW was deeper front to back. Pt c/o neck pain when cued to look up, educated on upright posture to decrease neck pain, maintain RW closer to self, and improve functional hip ROM during gait.  Pt wheeled back to room, given all needs within reach.   Discharge Criteria: Patient will be discharged from PT if patient refuses treatment 3 consecutive times without medical reason, if treatment goals not met, if there is a change in medical status, if patient makes no progress towards goals or if patient is discharged from hospital.  The above assessment, treatment plan, treatment alternatives and goals were discussed and mutually agreed upon: by patient and by family  [name] 09/11/2023, 11:53 AM

## 2023-09-11 NOTE — Progress Notes (Signed)
 Chaplain attempted to visit with [name] in response to AD spiritual consult, however he is unavailable for at least the next hour. Chaplains will follow up.

## 2023-09-11 NOTE — Progress Notes (Signed)
 Inpatient Rehabilitation Care Coordinator Assessment and Plan Patient Details  Name: Aaron Richmond MRN: [id number] Date of Birth: 26-Aug-1945  Today's Date: 09/11/2023  Hospital Problems: Principal Problem:   Acute incomplete paraplegia [facility]) Active Problems:   Lumbar burst fracture [facility])  Past Medical History:  Past Medical History:  Diagnosis Date   Aneurysm of aortic root    Overview:  Last Assessment & Plan:  Planning to see Dr Army for evaluation of 5cm aneurysm.  - will check PFT in prep for possible procedure / SGY   Aortic atherosclerosis (HCC) 06/14/2022   Aortic regurgitation    Aortic valve insufficiency    Ascending aortic aneurysm (HCC) 09/11/2016   Atherosclerotic vascular disease 05/27/2022   Atrial fibrillation (HCC)    Atrial fibrillation with RVR (HCC) 07/05/2022   BMI 27.0-27.9,adult 06/14/2022   BPH (benign prostatic hyperplasia)    BRBPR (bright red blood per rectum) 05/20/2023   Cardiomyopathy (HCC) 09/11/2016   CHF (congestive heart failure) (HCC)    CKD (chronic kidney disease) stage 2, GFR 60-89 ml/min 11/23/2021   Closed fracture of right hip (HCC) 03/10/2023   Coronary artery calcification seen on CT scan 10/13/2017   Dysrhythmia    Essential hypertension 06/14/2022   Gastrointestinal hemorrhage 05/20/2023   Heart murmur    High risk medication use 07/31/2022   History of cardiomyopathy 11/09/2021   History of pulmonary embolism 09/24/2012   Overview:  Last Assessment & Plan:  Hx PE x 2 per notes, ? 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  - needs auto-titration study by [employer] or local company, data to RB to adjust his home device.    OSA (obstructive sleep apnea) 09/13/2022   Prediabetes    Primary osteoarthritis involving multiple joints 06/14/2022   Prostate cancer metastatic to bone (HCC) 06/14/2022   Pulmonary embolism (HCC)    Rhinitis    Sacral wound 04/11/2023   Seasonal allergies 06/14/2022   Spondylolisthesis of lumbar region 12/12/2021   Stage 3a chronic kidney disease (HCC) 06/14/2022   Unstageable pressure ulcer of sacral region (HCC) 03/10/2023   Past Surgical History:  Past Surgical History:  Procedure Laterality Date   APPENDECTOMY     COLONOSCOPY N/A 05/23/2023   Procedure: COLONOSCOPY;  Surgeon: Elicia Claw, MD;  Location: [facility];  Service: Gastroenterology;  Laterality: N/A;   HERNIA REPAIR     LAMINECTOMY WITH POSTERIOR LATERAL ARTHRODESIS LEVEL 4 N/A 09/03/2023   Procedure: Posterior lateral fusion - Thoracic Ten-Thoracic Eleven - Thoracic Eleven -Thoracic Twelve - Thoracic Twelve-Lumbar One - Lumbar One-Lumbar Two - Lumbar Two-Lumbar Three - Lumbar Three-Lumbar Four, laminectomy and foraminotomy transpedicular decompression Lumbar One;  Surgeon: Onetha Kuba, MD;  Location: [facility] OR;  Service: Neurosurgery;  Laterality: N/A;  Posterior lateral fusion - T10-T11    LUMBAR LAMINECTOMY  02/06/2022   Facetectom & foraminotomy for decompression of the cauda equina & nerve root L4-5, Arley PHEBE Dark, MD   TONSILLECTOMY  Social History:  reports that he has never smoked. He has never used smokeless tobacco. He reports that he does not drink alcohol  and does not use drugs.  Family / Support Systems Marital Status: Married How Long?: 18 years Patient Roles: Spouse, Parent Spouse/Significant Other: [name] (wife) Children:  Blended family; pt has no but wife has two children. She has one dtr that lives in [place] but unable to assist; her other child lives out of state. Other Supports: None Anticipated Caregiver: Wife Ability/Limitations of Caregiver: Wife to provide 24/7 care Caregiver Availability: 24/7 Family Dynamics: Pt lives with his wife.  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 Education: Corporate treasurer - How often do you need to have someone help you when you read instructions, pamphlets, or other written material from your doctor or pharmacy?: Never Writes: Yes Employment Status: Retired Marine scientist Issues: Denies Guardian/Conservator: HCPOA in chart on unit wife is Product manager.   Abuse/Neglect Abuse/Neglect Assessment Can Be Completed: Yes Physical Abuse: Denies Verbal Abuse: Denies Sexual Abuse: Denies Exploitation of patient/patient's resources: Denies Self-Neglect: Denies  Patient response to: Social Isolation - How often do you feel lonely or isolated from those around you?: Never  Emotional Status Pt's affect, behavior and adjustment status: Pt in good spirits despite flat affect. Recent Psychosocial Issues: Denies Psychiatric History: Denies Substance Abuse History: Denies  Patient / Family Perceptions, Expectations & Goals Pt/Family understanding of illness & functional limitations: Pt adn wife have a general understanding of care needs Premorbid pt/family roles/activities: Assistance with ADLs/IADLs Anticipated changes in roles/activities/participation: continued assistance Pt/family expectations/goals: Pt goal is to work on getting to where he can drive, and being as independent as possible so he does not have to depend on his wife.  Community Resources [employer]: None Premorbid Home Care/DME Agencies:  None Transportation available at discharge: [place] Is the patient able to respond to transportation needs?: Yes In the past 12 months, has lack of transportation kept you from medical appointments or from getting medications?: No In the past 12 months, has lack of transportation kept you from meetings, work, or from getting things needed for daily living?: No Resource referrals recommended: Neuropsychology  Discharge Planning Living Arrangements: Spouse/significant other Support Systems: Spouse/significant other Type of Residence: Private residence Community education officer Resources: Media planner (specify) ([employer]) Financial Resources: Social Security (savings/retirement) Financial Screen Referred: No Living Expenses: Own Money Management: Spouse Does the patient have any problems obtaining your medications?: No Home Management: Wife manages all home care needs Patient/Family Preliminary Plans: No changes Care Coordinator Barriers to Discharge: Decreased caregiver support, Lack of/limited family support, Insurance for SNF coverage Care Coordinator Anticipated Follow Up Needs: HH/OP  Clinical Impression SW met with pt and pt wife in room to introduce self, explain role, and discuss discharge process. Pt is not a Cytogeneticist. DME- cane,. RW, bilateral rails at step entrance; showerrchair with back, ordered shower chair with back and arms.  Yanet Balliet A Alastair Hennes 09/11/2023, 4:14 PM

## 2023-09-11 NOTE — Progress Notes (Addendum)
 PROGRESS NOTE   Subjective/Complaints:  Pt reports lost a lot of weight lately- needs to gain weight.  Back hurting- hasn't taken pain meds since got here yesterday- needs meds.  Feels dazed/groggy, since just woke up.  Notes R foot drop since hip surgery in January 2025- wants to get it back- explained we can do estim, but there are no guarantees we can get the R foot back moving.  LBM Tuesday- 2 days ago- c/o mild constipation- wants Miralax  again today- is written for BID and prn.     ROS:  Pt denies SOB, abd pain, CP, N/V/ mild constipation, C/D, and vision changes Per HPI   Objective:   DG Foot 2 Views Right Result Date: 09/10/2023 CLINICAL DATA:  96001 Foot pain 03998 EXAM: RIGHT FOOT - 2 VIEW COMPARISON:  None Available. FINDINGS: Diffuse osteopenia. Moderate hallux valgus deformity.No acute fracture or dislocation. There is no evidence of arthropathy or other focal bone abnormality. Diffuse peripheral vascular atherosclerosis. No radiopaque foreign body. IMPRESSION: Diffuse osteopenia.  No acute fracture or dislocation. Electronically Signed   By: Rogelia Myers M.D.   On: 09/10/2023 16:09   Recent Labs    09/10/23 1030 09/11/23 0913  WBC 10.2 8.3  HGB 8.7* 9.1*  HCT 26.6* 27.6*  PLT 252 306   Recent Labs    09/10/23 1030 09/11/23 0913  NA 131* 136  K 4.0 3.9  CL 97* 98  CO2 24 28  GLUCOSE 118* 196*  BUN 14 13  CREATININE 1.06 1.30*  CALCIUM  8.7* 9.1    Intake/Output Summary (Last 24 hours) at 09/11/2023 1044 Last data filed at 09/11/2023 1000 Gross per 24 hour  Intake 480 ml  Output 1500 ml  Net -1020 ml        Physical Exam: Vital Signs Blood pressure (!) 113/50, pulse 68, temperature 98.3 F (36.8 C), resp. rate 16, height 5' 11 (1.803 m), weight 75 kg, SpO2 94%.   General: awake, alert, appropriate, sitting up in bed; after woke him up; NAD HENT: conjugate gaze; oropharynx moist CV:  regular rate in afib; ; no JVD Pulmonary: CTA B/L; no W/R/R- good air movement GI: soft, NT, ND, (+)BS- slightly hypoactive Psychiatric: appropriate- very interactive Neurological: Ox4 Skin: Bruising on b/l UE, Incision with dressing CDI, foam dressing both feet Neuro:     Mental Status: AAOx4, memory grossly intact Speech/Languate: fluent, follows commands CRANIAL NERVES:2-12  intact     MOTOR: RUE: 4+/5 Deltoid,4+/5 Biceps, 4+/5 Triceps,4+/5 Grip LUE: 4+/5 Deltoid, 4+/5 Biceps, 4+/5 Triceps, 4+/5 Grip RLE: HF 4-/5, KE 4+/5, ADF 0/5, APF 4/5 LLE: HF 4-/5, KE 4+/5, ADF 4/5, APF 4/5     REFLEXES: NO ankle clonus   SENSORY: Normal to touch all 4 extremities   Coordination: No UE ataxia/dysmetria noted   Picc R arm, IV L arm   Assessment/Plan: 1. Functional deficits which require 3+ hours per day of interdisciplinary therapy in a comprehensive inpatient rehab setting. Physiatrist is providing close team supervision and 24 hour management of active medical problems listed below. Physiatrist and rehab team continue to assess barriers to discharge/monitor patient progress toward functional and medical goals  Care Tool:  Bathing  Bathing assist       Upper Body Dressing/Undressing Upper body dressing        Upper body assist      Lower Body Dressing/Undressing Lower body dressing            Lower body assist       Toileting Toileting    Toileting assist       Transfers Chair/bed transfer  Transfers assist           Locomotion Ambulation   Ambulation assist              Walk 10 feet activity   Assist           Walk 50 feet activity   Assist           Walk 150 feet activity   Assist           Walk 10 feet on uneven surface  activity   Assist           Wheelchair     Assist               Wheelchair 50 feet with 2 turns activity    Assist            Wheelchair 150  feet activity     Assist          Blood pressure (!) 113/50, pulse 68, temperature 98.3 F (36.8 C), resp. rate 16, height 5' 11 (1.803 m), weight 75 kg, SpO2 94%.  Medical Problem List and Plan: 1. Functional deficits secondary to L1 burst fracture- with traumatic incomplete ASIA D paraplegia with cord compression status post T10-L4 fusion with transpedicular decompression L1.  Lumbar corset when out of bed applied in sitting position             -patient may shower             -ELOS/Goals: 10-12 days, PT/OT mina/sup             First day of evaluations- con't CIR PT and OT  Main limitation- LE weakness- can do Estim for R foot drop- has been >2 years since prostate CA 2.  Antithrombotics: -DVT/anticoagulation:  Pharmaceutical: Heparin  transitioning back to Xarelto  9/4- Xarelto  to start today             -antiplatelet therapy: N/A 3. Pain Management: Tramadol /hydrocodone  as needed, Robaxin  500 mg 4 times daily 4. Mood/Behavior/Sleep: Provide emotional support             -antipsychotic agents: N/A 5. Neuropsych/cognition: This patient is capable of making decisions on his own behalf. 6. Skin/Wound Care: Routine skin checks 7. Fluids/Electrolytes/Nutrition: Routine in and outs with follow-up chemistries 8.  Acute blood loss anemia.  Follow-up CBC 9.  Shock/narrow complex tachycardia with hypotension.  Resolved.  Follow-up cardiology services 10.  Chronic systolic congestive heart failure/nonischemic CMP.  Demadex  20 mg daily, Aldactone  25 mg daily, Farxiga  10 mg daily.  Monitor for any fluid overload. Daily weights  9/4- Last weight yesterday 75 kg- will ask nursing to get weight daily 11.  CAD.  Toprol -XL 12.5 mg daily.  Monitor with increased mobility 12.  Atrial fibrillation.  Transitioning from IV heparin  back to Xarelto .  Continue amiodarone  200 mg twice daily for now.  Continue metoprolol   9/4- rate controlled 13.  CKD stage IIIA.  Follow-up chemistries.  Avoid nephrotoxic  medications.  9/4- pt's Cr 1.30- is still in baseline range- con't to monitor 14.  Hyperlipidemia.  Crestor  15.  History of prostate cancer.  Flomax  0.4 mg daily.- per PA, was 2022- pt said doesn't remember when but more than 3 years ago. 16.  Constipation.  MiraLAX  twice daily             - LBM 9/2, continent  9/4- wants more miralax - has BID and prn- will make sure pt continues to get meds.   17.R foot drop-chronic, has AFO   9/4- from Hip surgery in January- went over with pt just the hip fx could have caused the R foot drop- we can do E stim- since has been since 2017 since prostate CA   I spent a total of  52    minutes on total care today- >50% coordination of care- due to  Review of chart, d/w therapy about dx, and estim; also d/w nursing and prolonged d/w pt about his dx's    LOS: 1 days A FACE TO FACE EVALUATION WAS PERFORMED  Aaron Richmond 09/11/2023, 10:44 AM

## 2023-09-11 NOTE — Evaluation (Addendum)
 Occupational Therapy Assessment and Plan  Patient Details  Name: Aaron Richmond MRN: [id number] Date of Birth: Nov 14, 1945  OT Diagnosis: acute pain and muscle weakness (generalized) Rehab Potential: Rehab Potential (ACUTE ONLY): Good ELOS: 10-14   Today's Date: 09/11/2023 OT Individual Time: 9199-9086 [id number] OT Individual Time Calculation (min): 103 min     Hospital Problem: Principal Problem:   Acute incomplete paraplegia (HCC) Active Problems:   Lumbar burst fracture (HCC)   Past Medical History:  Past Medical History:  Diagnosis Date   Aneurysm of aortic root    Overview:  Last Assessment & Plan:  Planning to see Dr Army for evaluation of 5cm aneurysm.  - will check PFT in prep for possible procedure / SGY   Aortic atherosclerosis (HCC) 06/14/2022   Aortic regurgitation    Aortic valve insufficiency    Ascending aortic aneurysm (HCC) 09/11/2016   Atherosclerotic vascular disease 05/27/2022   Atrial fibrillation (HCC)    Atrial fibrillation with RVR (HCC) 07/05/2022   BMI 27.0-27.9,adult 06/14/2022   BPH (benign prostatic hyperplasia)    BRBPR (bright red blood per rectum) 05/20/2023   Cardiomyopathy (HCC) 09/11/2016   CHF (congestive heart failure) (HCC)    CKD (chronic kidney disease) stage 2, GFR 60-89 ml/min 11/23/2021   Closed fracture of right hip (HCC) 03/10/2023   Coronary artery calcification seen on CT scan 10/13/2017   Dysrhythmia    Essential hypertension 06/14/2022   Gastrointestinal hemorrhage 05/20/2023   Heart murmur    High risk medication use 07/31/2022   History of cardiomyopathy 11/09/2021   History of pulmonary embolism 09/24/2012   Overview:  Last Assessment & Plan:  Hx PE x 2 per notes, ? Whether he was treated adequately  - check hypercoag panel prior to initiation of any anticoag for his A fib (or recurrent PE)   Hypercholesterolemia 06/14/2022   Hyperlipidemia    Hypertension    Idiopathic medial aortopathy and arteriopathy (HCC)  05/14/2021   Lumbar radiculopathy 01/24/2022   Malignant neoplasm of prostate (HCC) 10/04/2020   Malignant neoplasm of prostate metastatic to bone (HCC) 10/04/2020   Mitral valve insufficiency 06/14/2022   Obesity (BMI 30-39.9) 10/16/2017   Obstructive sleep apnea syndrome 09/24/2012   Overview:  Last Assessment & Plan:  Has [employer] patient, owns his machine.  - needs auto-titration study by [employer] or local company, data to RB to adjust his home device.    OSA (obstructive sleep apnea) 09/13/2022   Prediabetes    Primary osteoarthritis involving multiple joints 06/14/2022   Prostate cancer metastatic to bone (HCC) 06/14/2022   Pulmonary embolism (HCC)    Rhinitis    Sacral wound 04/11/2023   Seasonal allergies 06/14/2022   Spondylolisthesis of lumbar region 12/12/2021   Stage 3a chronic kidney disease (HCC) 06/14/2022   Unstageable pressure ulcer of sacral region (HCC) 03/10/2023   Past Surgical History:  Past Surgical History:  Procedure Laterality Date   APPENDECTOMY     COLONOSCOPY N/A 05/23/2023   Procedure: COLONOSCOPY;  Surgeon: Elicia Claw, MD;  Location: [facility];  Service: Gastroenterology;  Laterality: N/A;   HERNIA REPAIR     LAMINECTOMY WITH POSTERIOR LATERAL ARTHRODESIS LEVEL 4 N/A 09/03/2023   Procedure: Posterior lateral fusion - Thoracic Ten-Thoracic Eleven - Thoracic Eleven -Thoracic Twelve - Thoracic Twelve-Lumbar One - Lumbar One-Lumbar Two - Lumbar Two-Lumbar Three - Lumbar Three-Lumbar Four, laminectomy and foraminotomy transpedicular decompression Lumbar One;  Surgeon: Onetha Kuba, MD;  Location: [facility] OR;  Service: Neurosurgery;  Laterality: N/A;  Posterior lateral fusion - T10-T11    LUMBAR LAMINECTOMY  02/06/2022   Facetectom & foraminotomy for decompression of the cauda equina & nerve root L4-5, Arley PHEBE Dark, MD   TONSILLECTOMY      Assessment & Plan Clinical Impression: Patient is a 78 y.o. year old male with recent admission to the hospital  on 09/03/2023 with back and right leg pain numbness tingling in his legs. Workup revealed severe cord compression at L1 marked spondylosis at that level recommendations of transpedicular decompression of L1 and posterior lateral instrumented fusion. Patient underwent T10-L4 fusion by Dr. Onetha 09/03/2023 with posterior lateral arthrodesis T10-11, T11-12, T12-L1, L1-L2, L2-L3, L3-L4. Placed in a lumbar corset applied in sitting position. Hospital course patient developed narrow complex tachycardia with hypotension, initially failed cardioversion attempts but later converted to A-fib RVR with 360 J shock with improved pressures followed by cardiology services. Hospital course acute blood loss anemia 7.8 and monitored. Patient transferred to CIR on 09/10/2023 .    Patient currently requires mod with basic self-care skills secondary to muscle weakness.  Prior to hospitalization, patient could complete BADL's with supervision.  Patient will benefit from skilled intervention to increase independence with basic self-care skills and increase level of independence with iADL prior to discharge home with care partner.  Anticipate patient will require intermittent supervision and follow up home health.  OT - End of Session Activity Tolerance: Tolerates 30+ min activity with multiple rests Endurance Deficit: Yes OT Assessment Rehab Potential (ACUTE ONLY): Good OT Patient demonstrates impairments in the following area(s): Balance;Pain;Safety;Endurance;Motor OT Basic ADL's Functional Problem(s): Bathing;Dressing;Toileting OT Advanced ADL's Functional Problem(s): Simple Meal Preparation OT Transfers Functional Problem(s): Tub/Shower;Toilet OT Plan OT Intensity: Minimum of 1-2 x/day, 45 to 90 minutes OT Frequency: 5 out of 7 days OT Duration/Estimated Length of Stay: 10-14 OT Treatment/Interventions: Balance/vestibular training;Community reintegration;Disease mangement/prevention;Self Care/advanced ADL  retraining;Patient/family education;Therapeutic Exercise;Therapeutic Activities;Pain management;Functional mobility training;DME/adaptive equipment instruction;Discharge planning OT Self Feeding Anticipated Outcome(s): Independent OT Basic Self-Care Anticipated Outcome(s): Supervision/Modified Independent OT Toileting Anticipated Outcome(s): Modified Independent OT Bathroom Transfers Anticipated Outcome(s): Supervision- shower; Modified Independent toilet OT Recommendation Patient destination: Home Follow Up Recommendations: Home health OT Equipment Recommended: 3 in 1 bedside comode;Tub/shower seat;Rolling walker with 5 wheels Equipment Details: Dressing equipment   OT Evaluation Precautions/Restrictions  Precautions Precautions: Fall;Back Recall of Precautions/Restrictions: Intact Required Braces or Orthoses: Spinal Brace;Other Brace Spinal Brace: Lumbar corset Other Brace: AFO RLE Restrictions Weight Bearing Restrictions Per Provider Order: No General Chart Reviewed: Yes Vital Signs   Pain Pain Assessment Pain Scale: 0-10 Pain Score: 8  Pain Type: Surgical pain;Chronic pain Pain Location: Back Pain Orientation: Posterior;Mid Pain Descriptors / Indicators: Aching Pain Onset: On-going Pain Intervention(s): Medication (See eMAR);RN made aware;Rest Home Living/Prior Functioning Home Living Family/patient expects to be discharged to:: Private residence Living Arrangements: Spouse/significant other Available Help at Discharge: Family, Available 24 hours/day Type of Home: House Home Access: Stairs to enter [employer] of Steps: Step to enter, has step down livingroom with rail Entrance Stairs-Rails:  (B rails, uses 1 rail. 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 Prior Function Level of Independence: Needs assistance with ADLs, Requires assistive device for independence  Able to Take Stairs?: Yes Driving: No Vocation: Retired Administrator, sports Baseline Vision/History: 1 Wears glasses Ability to See in Adequate Light: 0 Adequate Patient Visual Report: No change from baseline Vision Assessment?: No apparent visual deficits Additional Comments: at baseline Perception  Perception: Within Functional Limits Praxis Praxis: WFL Cognition Cognition Overall Cognitive Status: Within Functional Limits for tasks assessed Arousal/Alertness: Awake/alert Orientation Level: Person;Place;Situation Person: Oriented Place: Oriented Situation: Oriented Memory: Appears intact Awareness: Appears intact Problem Solving: Appears intact Safety/Judgment: Appears intact Comments: Able to recall BLT precaution, but needs cuing to integrate into activities. Brief Interview for Mental Status (BIMS) Repetition of Three Words (First Attempt): 3 Temporal Orientation: Year: Correct Temporal Orientation: Month: Accurate within 5 days Temporal Orientation: Day: Correct Recall: Sock: Yes, no cue required Recall: Blue: Yes, no cue required Recall: Bed: Yes, no cue required BIMS Summary Score: 15 Sensation Sensation Light Touch: Appears Intact Hot/Cold: Appears Intact Proprioception: Appears Intact Stereognosis: Appears Intact Coordination Fine Motor Movements are Fluid and Coordinated: Yes Motor  Motor Motor: Abnormal postural alignment and control  Trunk/Postural Assessment  Cervical Assessment Cervical Assessment: Within Functional  Limits Thoracic Assessment Thoracic Assessment: Exceptions to [facility] Lumbar Assessment Lumbar Assessment: Exceptions to [facility] Postural Control Postural Control: Deficits on evaluation  Balance Dynamic Sitting Balance Sitting balance - Comments: EOB; CGA to minA unsupported Extremity/Trunk Assessment RUE Assessment RUE Assessment: Within Functional Limits General Strength Comments: Grossly intact LUE Assessment LUE Assessment: Within Functional Limits General Strength Comments: grossly intact  Care Tool Care Tool Self Care Eating   Eating Assist Level: Set up assist    Oral Care    Oral Care Assist Level: Set up assist    Bathing   Body parts bathed by patient: Right arm;Left arm;Chest;Abdomen;Front perineal area;Face;Left upper leg;Right upper leg Body parts bathed by helper: Right lower leg;Left lower leg;Buttocks   Assist Level: Moderate Assistance - Patient 50 - 74%    Upper Body Dressing(including orthotics)   What is the patient wearing?: Pull over shirt;Orthosis Orthosis activity level: Performed by helper Assist Level: Minimal Assistance - Patient > 75%    Lower Body Dressing (excluding footwear)   What is the patient wearing?: Pants Assist for lower body dressing: Maximal Assistance - Patient 25 - 49%    Putting on/Taking off footwear   What is the patient wearing?: Socks;AFO;Shoes Assist for footwear: Dependent - Patient 0%       Care Tool Toileting Toileting activity   Assist for toileting: Moderate Assistance - Patient 50 - 74%     Care Tool Bed Mobility Roll left and right activity   Roll left and right assist level: Minimal Assistance - Patient > 75%    Sit to lying activity   Sit to lying assist level: Moderate Assistance - Patient 50 - 74%    Lying to sitting on side of bed activity   Lying to sitting on side of bed assist level: the ability to move from lying on the back to sitting on the side of the bed with no back support.: Moderate Assistance  - Patient 50 - 74%     Care Tool Transfers Sit to stand transfer   Sit to stand assist level: Moderate Assistance - Patient 50 - 74%    Chair/bed transfer   Chair/bed transfer assist level: Moderate Assistance - Patient 50 - 74%     Toilet transfer   Assist  Level: Moderate Assistance - Patient 50 - 74%     Care Tool Cognition  Expression of Ideas and Wants Expression of Ideas and Wants: 4. Without difficulty (complex and basic) - expresses complex messages without difficulty and with speech that is clear and easy to understand  Understanding Verbal and Non-Verbal Content Understanding Verbal and Non-Verbal Content: 4. Understands (complex and basic) - clear comprehension without cues or repetitions   Memory/Recall Ability Memory/Recall Ability : Staff names and faces;Location of own room;Current season   Refer to Care Plan for Long Term Goals  SHORT TERM GOAL WEEK 1 OT Short Term Goal 1 (Week 1): Min assist with AE for LE dressing OT Short Term Goal 2 (Week 1): Patient to don LSO with set up and verbal cues OT Short Term Goal 3 (Week 1): Patient to perform bathing with Min assist using AE PRN OT Short Term Goal 4 (Week 1): Patient to perform grooming standing at sink with close supervision  Recommendations for other services: None    Skilled Therapeutic Intervention ADL ADL Equipment Provided: Reacher Eating: Set up Where Assessed-Eating: Bed level Grooming: Setup Where Assessed-Grooming: Sitting at sink Upper Body Bathing: Minimal assistance Where Assessed-Upper Body Bathing: Edge of bed Lower Body Bathing: Dependent Where Assessed-Lower Body Bathing: Edge of bed Upper Body Dressing: Minimal assistance Where Assessed-Upper Body Dressing: Edge of bed Lower Body Dressing: Dependent Where Assessed-Lower Body Dressing: Edge of bed Toileting: Moderate assistance Where Assessed-Toileting: Teacher, adult education: Moderate assistance Toilet Transfer Method:  Proofreader: Raised toilet seat;Bedside commode;Grab bars Tub/Shower Transfer: Unable to assess Tub/Shower Transfer Method: Unable to assess Film/video editor: Moderate assistance Film/video editor Method: Ambulating ADL Comments: Patient has shower chair, but sitting in it had become too painful prior to sx and would stand with walker in the shower. Mobility  Bed Mobility Bed Mobility: Rolling Left Rolling Left: Minimal Assistance - Patient > 75% Transfers Sit to Stand: Moderate Assistance - Patient 50-74% Stand to Sit: Moderate Assistance - Patient 50-74%  Continued treatment with education on functional transfers using RW, following precautions. Patient with good recall of spinal precautions but needed cuing for integration of precautions into transfers and self care. Worked with patient to simulate home bathroom obstacles for shower transfer and discussed limitation and techniques to implement to achieve patient's LTG's of being Mod I with basic meal prep as well as independence with self care. Patient motivated to regain independence.   Discharge Criteria: Patient will be discharged from OT if patient refuses treatment 3 consecutive times without medical reason, if treatment goals not met, if there is a change in medical status, if patient makes no progress towards goals or if patient is discharged from hospital.  The above assessment, treatment plan, treatment alternatives and goals were discussed and mutually agreed upon: by patient and by family  Isaiah JONETTA Freund 09/11/2023, 12:46 PM

## 2023-09-11 NOTE — Plan of Care (Signed)
  Problem: RH Balance Goal: LTG Patient will maintain dynamic standing with ADLs (OT) Description: LTG:  Patient will maintain dynamic standing balance with assist during activities of daily living (OT)  Flowsheets (Taken 09/11/2023 1222) LTG: Pt will maintain dynamic standing balance during ADLs with: Supervision/Verbal cueing   Problem: RH Grooming Goal: LTG Patient will perform grooming w/assist,cues/equip (OT) Description: LTG: Patient will perform grooming with assist, with/without cues using equipment (OT) Flowsheets (Taken 09/11/2023 1222) LTG: Pt will perform grooming with assistance level of: Independent   Problem: RH Bathing Goal: LTG Patient will bathe all body parts with assist levels (OT) Description: LTG: Patient will bathe all body parts with assist levels (OT) Flowsheets (Taken 09/11/2023 1222) LTG: Pt will perform bathing with assistance level/cueing: Supervision/Verbal cueing LTG: Position pt will perform bathing: Shower   Problem: RH Dressing Goal: LTG Patient will perform upper body dressing (OT) Description: LTG Patient will perform upper body dressing with assist, with/without cues (OT). Flowsheets (Taken 09/11/2023 1222) LTG: Pt will perform upper body dressing with assistance level of:  Supervision/Verbal cueing  Including orthosis Goal: LTG Patient will perform lower body dressing w/assist (OT) Description: LTG: Patient will perform lower body dressing with assist, with/without cues in positioning using equipment (OT) Flowsheets (Taken 09/11/2023 1222) LTG: Pt will perform lower body dressing with assistance level of:  Supervision/Verbal cueing  Including orthosis   Problem: RH Toileting Goal: LTG Patient will perform toileting task (3/3 steps) with assistance level (OT) Description: LTG: Patient will perform toileting task (3/3 steps) with assistance level (OT)  Flowsheets (Taken 09/11/2023 1222) LTG: Pt will perform toileting task (3/3 steps) with assistance level:  Independent with assistive device   Problem: RH Simple Meal Prep Goal: LTG Patient will perform simple meal prep w/assist (OT) Description: LTG: Patient will perform simple meal prep with assistance, with/without cues (OT). Flowsheets (Taken 09/11/2023 1222) LTG: Pt will perform simple meal prep with assistance level of: Supervision/Verbal cueing LTG: Pt will perform simple meal prep w/level of: Ambulate with device   Problem: RH Toilet Transfers Goal: LTG Patient will perform toilet transfers w/assist (OT) Description: LTG: Patient will perform toilet transfers with assist, with/without cues using equipment (OT) Flowsheets (Taken 09/11/2023 1222) LTG: Pt will perform toilet transfers with assistance level of: Independent with assistive device   Problem: RH Tub/Shower Transfers Goal: LTG Patient will perform tub/shower transfers w/assist (OT) Description: LTG: Patient will perform tub/shower transfers with assist, with/without cues using equipment (OT) Flowsheets (Taken 09/11/2023 1222) LTG: Pt will perform tub/shower stall transfers with assistance level of: Supervision/Verbal cueing LTG: Pt will perform tub/shower transfers from: Walk in shower

## 2023-09-11 NOTE — Progress Notes (Signed)
 Inpatient Rehabilitation Admission Medication Review by a Pharmacist  A complete drug regimen review was completed for this patient to identify any potential clinically significant medication issues.  High Risk Drug Classes Is patient taking? Indication by Medication  Antipsychotic No   Anticoagulant Yes Rivaroxaban  for afib  Antibiotic No   Opioid Yes Norco, tramadol  for acute pain  Antiplatelet No   Hypoglycemics/insulin No   Vasoactive Medication Yes Metoprolol  for HFrEF  Chemotherapy No   Other Yes Amiodarone  for afib  Bisacodyl , miralax  for constipation Dapagliflozin , spironolactone , torsemide  for HFrEF Methocarbamol  for muscle spasm Pantoprazole  for post-op GI prophylaxis  Potassium for supplementation Rosuvastatin  for HLD Tamsulosin  for BPH Acetaminophen  for mild pain Maalox for indigestion Ondansetron  for nausea     Type of Medication Issue Identified Description of Issue Recommendation(s)  Drug Interaction(s) (clinically significant)     Duplicate Therapy     Allergy     No Medication Administration End Date     Incorrect Dose     Additional Drug Therapy Needed     Significant med changes from prior encounter (inform family/care partners about these prior to discharge). HELD home metoprolol  tartrate (changed to succinate), entresto  NEW amiodarone , dapagloflozin, metoprolol  succinate, pantoprazole , spironolactone   Review at discharge   Other  Pantoprazole  was added after neurosurgery, is not a home med, no longer needed for GI prophylaxis Consider discontinuing - secure chat to PA     Clinically significant medication issues were identified that warrant physician communication and completion of prescribed/recommended actions by midnight of the next day:  No   Name of provider notified for urgent issues identified:    Provider Method of Notification:      Pharmacist comments:   Time spent performing this drug regimen review (minutes):  20  Jinnie Door,  PharmD, BCPS, [facility] Clinical Pharmacist  Please check AMION for all [facility] phone numbers After 10:00 PM, call Main Pharmacy [phone]

## 2023-09-11 NOTE — Progress Notes (Signed)
 Inpatient Rehabilitation  Patient information reviewed and entered into eRehab system by [facility]. Karen Kays., CCC/SLP, PPS Coordinator.  Information including medical coding, functional ability and quality indicators will be reviewed and updated through discharge.

## 2023-09-12 MED ORDER — SODIUM CHLORIDE 0.9% FLUSH: 10.0000 mL | INTRAVENOUS | Status: DC | PRN

## 2023-09-12 MED ORDER — SODIUM CHLORIDE 0.9% FLUSH
10.0000 mL | Freq: Two times a day (BID) | INTRAVENOUS | Status: DC
  Administered 2023-09-13 – 2023-09-18 (×12): 10 mL

## 2023-09-12 NOTE — Progress Notes (Signed)
 Physical Therapy Session Note  Patient Details  Name: Aaron Richmond MRN: [id number] Date of Birth: Aug 23, 1945  Today's Date: 09/12/2023 PT Individual Time: 0800-0915 PT Individual Time Calculation (min): 75 min   Short Term Goals: Week 1:  PT Short Term Goal 1 (Week 1): Pt will complete 3 consecutive sit to stand transfers w/ CGA PT Short Term Goal 2 (Week 1): Pt will perform 4 stairs w/ one handrail and side-stepping w/ minA PT Short Term Goal 3 (Week 1): Pt will demonstrate proper technique for sit to stand, using BUE to push off WC and not RW, 3 consecutive transfers in a row.  Skilled Therapeutic Interventions/Progress Updates:   Pt received supine in bed, agreeable to therapy but required increased time to get out of bed as pt is easily distractible. Nsg present at beginning of session to assess skin integrity. Therapist assisted w/ positioning in bed, requiring minA for R roll.   Pt wheeled to main gym to trial different AFO for R foot drop w/ improved R foot clearance during gait. Pt ambulated 27ft, 58ft, 67ft w/ RW CGA to minA for balance, to improve functional activity tolerance and decrease fall risk. Pt demonstrates forward lean/trunk flexion and significant cervical protraction. Pt performed marching in place, cued high knees w/ good carryover. Pt instructed to ambulate w/ high knees and demonstrated significantly improved B foot clearance in last bout of gait. Pt reported feeling exhausted at end of session but showed no s/s of fatigue in gait. Pt educated on how he maintained his improved gait mechanics despite significant fatigued. Pt wheeled back to room and remained upright in chair w/ call bell and all other needs in reach.    Therapy Documentation Precautions:  Precautions Precautions: Fall, Back Recall of Precautions/Restrictions: Intact Required Braces or Orthoses: Spinal Brace, Other Brace Spinal Brace: Lumbar corset Other Brace: AFO RLE Restrictions Weight  Bearing Restrictions Per Provider Order: No    Therapy/Group: Individual Therapy  [name] 09/12/2023, 1:05 PM

## 2023-09-12 NOTE — Progress Notes (Signed)
 Occupational Therapy Note  Patient Details  Name: Aaron Richmond MRN: [id number] Date of Birth: Apr 11, 1945  Today's Date: 09/12/2023 OT Missed Time: 45 Minutes Missed Time Reason: Patient unwilling/refused to participate without medical reason (eating breakfast)  Pt resting in bed upon arrival (0930). Pt had just started eating breakfast and expressed desire to eat breakfast before getting up again. Pt reported he had already had some therapy. Reviewed purpose of Rehab and communicated that he should expect 3-3.5 hrs of therapy each day Pt missed 45 mins skilled OT services. Will attempt to see pt again as schedule allows.    Maritza Ned [facility] 09/12/2023, 10:15 AM

## 2023-09-12 NOTE — Progress Notes (Signed)
 Physical Therapy Session Note  Patient Details  Name: Aaron Richmond MRN: [id number] Date of Birth: 29-Jun-1945  Today's Date: 09/12/2023 PT Individual Time: 0800-0915 PT Individual Time Calculation (min): 75 min   Short Term Goals: Week 1:  PT Short Term Goal 1 (Week 1): Pt will complete 3 consecutive sit to stand transfers w/ CGA PT Short Term Goal 2 (Week 1): Pt will perform 4 stairs w/ one handrail and side-stepping w/ minA PT Short Term Goal 3 (Week 1): Pt will demonstrate proper technique for sit to stand, using BUE to push off WC and not RW, 3 consecutive transfers in a row.  Skilled Therapeutic Interventions/Progress Updates: Pt presented in bed agreeable to therapy. Pt states pain 7/10, nsg notified and adv able to receive pain meds after 3. Pt completed bed mobility with modA and use of bed features. LSO applied and tightened. Bed elevated due to pain. Completed ambulatory transfer to w/c ~76ft with CGA. Transported to day room for energy conservation. Pt completed stand step transfer to high/low mat. Mat elevated and participated in Sit to stand x 5 working on decreasing use of UEs. Pt also participated in toe taps to 4in step 2 x 10 with CGA. Completed transfer back to w/c in same manner as prior and transferred to NuStep. Participated in NuStep L3 x 5 min for general conditioning with pt able to maintain ~40SPM. Pt ambulated ~22ft to w/c with CGA and transported back to room. Completed ambulatory transfer to bed and required minA for sit to supine for BLE management. Pt left in bed at end of session with bed alarm on, call bell within reach and needs met.      Therapy Documentation Precautions:  Precautions Precautions: Fall, Back Recall of Precautions/Restrictions: Intact Required Braces or Orthoses: Spinal Brace, Other Brace Spinal Brace: Lumbar corset Other Brace: AFO RLE Restrictions Weight Bearing Restrictions Per Provider Order: No General:   Vital Signs: Therapy  Vitals Temp: 97.9 F (36.6 C) Pulse Rate: 80 Resp: 18 BP: (!) 109/47 Patient Position (if appropriate): Lying Oxygen Therapy SpO2: 94 % O2 Device: Room Air Pain: Pain Assessment Pain Score: 3    Therapy/Group: Individual Therapy  Madylin Fairbank 09/12/2023, 3:08 PM

## 2023-09-12 NOTE — Evaluation (Signed)
 Recreational Therapy Assessment and Plan  Patient Details  Name: Aaron Richmond MRN: [id number] Date of Birth: Jun 17, 1945 Today's Date: 09/12/2023  Rehab Potential:  Good ELOS:   10-14 days  Assessment   Hospital Problem: Principal Problem:   Acute incomplete paraplegia (HCC) Active Problems:   Lumbar burst fracture [facility])     Past Medical History:      Past Medical History:  Diagnosis Date   Aneurysm of aortic root      Overview:  Last Assessment & Plan:  Planning to see Dr Army for evaluation of 5cm aneurysm.  - will check PFT in prep for possible procedure / SGY   Aortic atherosclerosis (HCC) 06/14/2022   Aortic regurgitation     Aortic valve insufficiency     Ascending aortic aneurysm (HCC) 09/11/2016   Atherosclerotic vascular disease 05/27/2022   Atrial fibrillation (HCC)     Atrial fibrillation with RVR (HCC) 07/05/2022   BMI 27.0-27.9,adult 06/14/2022   BPH (benign prostatic hyperplasia)     BRBPR (bright red blood per rectum) 05/20/2023   Cardiomyopathy (HCC) 09/11/2016   CHF (congestive heart failure) (HCC)     CKD (chronic kidney disease) stage 2, GFR 60-89 ml/min 11/23/2021   Closed fracture of right hip (HCC) 03/10/2023   Coronary artery calcification seen on CT scan 10/13/2017   Dysrhythmia     Essential hypertension 06/14/2022   Gastrointestinal hemorrhage 05/20/2023   Heart murmur     High risk medication use 07/31/2022   History of cardiomyopathy 11/09/2021   History of pulmonary embolism 09/24/2012    Overview:  Last Assessment & Plan:  Hx PE x 2 per notes, ? 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  - needs auto-titration study by [employer] or local company, data to RB to adjust his home device.    OSA (obstructive sleep apnea) 09/13/2022   Prediabetes     Primary osteoarthritis involving multiple joints 06/14/2022   Prostate cancer metastatic to bone (HCC) 06/14/2022   Pulmonary embolism (HCC)     Rhinitis     Sacral wound 04/11/2023   Seasonal allergies 06/14/2022   Spondylolisthesis of lumbar region 12/12/2021   Stage 3a chronic kidney disease (HCC) 06/14/2022   Unstageable pressure ulcer of sacral region (HCC) 03/10/2023        Past Surgical History:       Past Surgical History:  Procedure Laterality Date   APPENDECTOMY       COLONOSCOPY N/A 05/23/2023    Procedure: COLONOSCOPY;  Surgeon: Elicia Claw, MD;  Location: [facility];  Service: Gastroenterology;  Laterality: N/A;   HERNIA REPAIR       LAMINECTOMY WITH POSTERIOR LATERAL ARTHRODESIS LEVEL 4 N/A 09/03/2023    Procedure: Posterior lateral fusion - Thoracic Ten-Thoracic Eleven - Thoracic Eleven -Thoracic Twelve - Thoracic Twelve-Lumbar One - Lumbar One-Lumbar Two - Lumbar Two-Lumbar Three - Lumbar Three-Lumbar Four, laminectomy and foraminotomy transpedicular decompression Lumbar One;  Surgeon: Onetha Kuba, MD;  Location: [facility] OR;  Service: Neurosurgery;  Laterality: N/A;  Posterior lateral fusion - T10-T11    LUMBAR LAMINECTOMY   02/06/2022    Facetectom & foraminotomy for decompression of the cauda equina & nerve root L4-5, Arley PHEBE Dark, MD   TONSILLECTOMY              Assessment & Plan Clinical Impression: Patient is a 78 y.o. year old male with recent admission to the hospital on 09/03/2023 with back and right leg pain numbness tingling in his legs. Workup revealed severe cord  compression at L1 marked spondylosis at that level recommendations of transpedicular decompression of L1 and posterior lateral instrumented fusion. Patient underwent T10-L4 fusion by Dr. Onetha 09/03/2023 with posterior lateral arthrodesis T10-11, T11-12, T12-L1, L1-L2, L2-L3, L3-L4. Placed in a lumbar corset applied in sitting position. Hospital course patient developed narrow complex tachycardia with hypotension, initially failed cardioversion attempts but later converted to A-fib RVR with 360 J shock with improved pressures followed by cardiology services. Hospital course acute blood loss anemia 7.8 and monitored. Patient transferred to CIR on 09/10/2023 .    Pt presents with decreased activity tolerance, decreased functional mobility, decreased balance Limiting pt's independence with leisure/community pursuits.  Met with pt today to discuss TR services including leisure education, activity analysis/modifications and stress management.  Also discussed the importance of social, emotional, spiritual health in addition to physical health and their effects on overall health and wellness.  Pt stated understanding.  Plan  No further TR at this time, will continue to monitor  Recommendations for other services: None   Discharge Criteria: Patient will be discharged from TR if patient refuses treatment 3 consecutive times without medical reason.  If treatment goals not met, if there is a change in medical status, if patient makes no progress towards goals or if patient is discharged from hospital.  The above assessment, treatment plan, treatment alternatives and goals were discussed and mutually agreed upon: by patient  Aracelli Woloszyn 09/12/2023, 1:38 PM

## 2023-09-12 NOTE — Progress Notes (Signed)
 This chaplain responded to unit's consult for creating/updating the Pt. Advance Directive.   The Pt. shares he gave a chaplain a copy of current AD to a chaplain for uploading in Epic. The chaplain understands the Pt. needs assistance in locating his original AD.   Chaplain Leeroy Hummer  [phone]

## 2023-09-12 NOTE — Progress Notes (Signed)
 PROGRESS NOTE   Subjective/Complaints:  Pt reports lbm yesterday- also had one ~ 11am today-  Said I woke him up- therapy wore him out yesterday but went well.  Good day Slept OK- bowel s better Multiple Bms yesterday per pt.     ROS:  Pt denies SOB, abd pain, CP, N/V/C/D, and vision changes  Per HPI   Objective:   MR CARDIAC VELOCITY FLOW MAP Result Date: 09/11/2023 CLINICAL DATA:  Cardiomyopathy of uncertain etiology EXAM: CARDIAC MRI TECHNIQUE: The patient was scanned on a 1.5 Tesla GE magnet. A dedicated cardiac coil was used. Functional imaging was done using Fiesta sequences. 2,3, and 4 chamber views were done to assess for RWMA's. Modified Simpson's rule using a short axis stack was used to calculate an ejection fraction on a dedicated work [employer]. The patient received 10 cc of Gadavist . After 10 minutes inversion recovery sequences were used to assess for infiltration and scar tissue. FINDINGS: Limited images of the lung fields showed no gross abnormalities. 51 mm ascending aorta. The left ventricle is severely dilated with normal wall thickness. Mild-moderate diffuse hypokinesis, LV EF 40%. Normal right ventricular size with RV EF 37%. Severely dilated left atrium, mildly dilated right atrium. The aortic valve is not visualized well enough to comment on number of leaflets. There is severe aortic insufficiency with regurgitant volume 59 mL and regurgitant fraction 50%. No significant mitral regurgitation (regurgitant fraction < 5%). On delayed enhancement imaging, there is a small area of <50% wall thickness subendocardial late gadolinium enhancement in the basal inferolateral wall. There is a small area of mid-wall LGE in the basal inferoseptal wall at the RV insertion site. MEASUREMENTS: MEASUREMENTS LVEDV 304 mL LVEDVi 157 mL/m2 LVSV 122 mL LVEF 40% RVEDV 93 mL RVEDVi 48 mL/m2 RVSV 35 mL RVEF 37% Aortic  forward volume 117 mL Aortic regurgitant volume 59 mL, regurgitant fraction 50% Global T1 1093, ECV 35% IMPRESSION: 1.  Severely dilated ascending aorta and root, 51 mm. 2. Severely dilated left ventricle with normal wall thickness, global hypokinesis with LV EF 40%. 3.  Normal RV size with RV EF 37%. 4. There is severe aortic insufficiency with regurgitant fraction 50% and regurgitant volume 59 mL. Unable to tell cusp number on the aortic valve from this study. 5. Delayed enhancement imaging with a small area of subendocardial LGE in the basal inferolateral wall that looks consistent with small prior MI. There is mid-wall LGE at the inferior RV insertion site that is nonspecific and tends to be related to pressure/volume overload. 6. Elevated extracellular volume percentage at 35% suggests increased myocardial fibrotic content. Severe aortic insufficiency in the setting of dilated ascending aorta and aortic root with LV dilation. [name] Electronically Signed   By: Ezra Shuck M.D.   On: 09/11/2023 17:56   MR CARDIAC VELOCITY FLOW MAP Result Date: 09/11/2023 CLINICAL DATA:  Cardiomyopathy of uncertain etiology EXAM: CARDIAC MRI TECHNIQUE: The patient was scanned on a 1.5 Tesla GE magnet. A dedicated cardiac coil was used. Functional imaging was done using Fiesta sequences. 2,3, and 4 chamber views were done to assess for RWMA's. Modified Simpson's rule using a short axis stack was  used to calculate an ejection fraction on a dedicated work [employer], [employer]. The patient received 10 cc of Gadavist . After 10 minutes inversion recovery sequences were used to assess for infiltration and scar tissue. FINDINGS: Limited images of the lung fields showed no gross abnormalities. 51 mm ascending aorta. The left ventricle is severely dilated with normal wall thickness. Mild-moderate diffuse hypokinesis, LV EF 40%. Normal right ventricular size with RV EF 37%. Severely dilated left atrium, mildly dilated  right atrium. The aortic valve is not visualized well enough to comment on number of leaflets. There is severe aortic insufficiency with regurgitant volume 59 mL and regurgitant fraction 50%. No significant mitral regurgitation (regurgitant fraction < 5%). On delayed enhancement imaging, there is a small area of <50% wall thickness subendocardial late gadolinium enhancement in the basal inferolateral wall. There is a small area of mid-wall LGE in the basal inferoseptal wall at the RV insertion site. MEASUREMENTS: MEASUREMENTS LVEDV 304 mL LVEDVi 157 mL/m2 LVSV 122 mL LVEF 40% RVEDV 93 mL RVEDVi 48 mL/m2 RVSV 35 mL RVEF 37% Aortic forward volume 117 mL Aortic regurgitant volume 59 mL, regurgitant fraction 50% Global T1 1093, ECV 35% IMPRESSION: 1.  Severely dilated ascending aorta and root, 51 mm. 2. Severely dilated left ventricle with normal wall thickness, global hypokinesis with LV EF 40%. 3.  Normal RV size with RV EF 37%. 4. There is severe aortic insufficiency with regurgitant fraction 50% and regurgitant volume 59 mL. Unable to tell cusp number on the aortic valve from this study. 5. Delayed enhancement imaging with a small area of subendocardial LGE in the basal inferolateral wall that looks consistent with small prior MI. There is mid-wall LGE at the inferior RV insertion site that is nonspecific and tends to be related to pressure/volume overload. 6. Elevated extracellular volume percentage at 35% suggests increased myocardial fibrotic content. Severe aortic insufficiency in the setting of dilated ascending aorta and aortic root with LV dilation. [name] Electronically Signed   By: Ezra Shuck M.D.   On: 09/11/2023 17:56   MR CARDIAC MORPHOLOGY W WO CONTRAST Result Date: 09/11/2023 CLINICAL DATA:  Cardiomyopathy of uncertain etiology EXAM: CARDIAC MRI TECHNIQUE: The patient was scanned on a 1.5 Tesla GE magnet. A dedicated cardiac coil was used. Functional imaging was done using Fiesta  sequences. 2,3, and 4 chamber views were done to assess for RWMA's. Modified Simpson's rule using a short axis stack was used to calculate an ejection fraction on a dedicated work [employer], [employer]. The patient received 10 cc of Gadavist . After 10 minutes inversion recovery sequences were used to assess for infiltration and scar tissue. FINDINGS: Limited images of the lung fields showed no gross abnormalities. 51 mm ascending aorta. The left ventricle is severely dilated with normal wall thickness. Mild-moderate diffuse hypokinesis, LV EF 40%. Normal right ventricular size with RV EF 37%. Severely dilated left atrium, mildly dilated right atrium. The aortic valve is not visualized well enough to comment on number of leaflets. There is severe aortic insufficiency with regurgitant volume 59 mL and regurgitant fraction 50%. No significant mitral regurgitation (regurgitant fraction < 5%). On delayed enhancement imaging, there is a small area of <50% wall thickness subendocardial late gadolinium enhancement in the basal inferolateral wall. There is a small area of mid-wall LGE in the basal inferoseptal wall at the RV insertion site. MEASUREMENTS: MEASUREMENTS LVEDV 304 mL LVEDVi 157 mL/m2 LVSV 122 mL LVEF 40% RVEDV 93 mL RVEDVi 48 mL/m2 RVSV 35 mL RVEF  37% Aortic forward volume 117 mL Aortic regurgitant volume 59 mL, regurgitant fraction 50% Global T1 1093, ECV 35% IMPRESSION: 1.  Severely dilated ascending aorta and root, 51 mm. 2. Severely dilated left ventricle with normal wall thickness, global hypokinesis with LV EF 40%. 3.  Normal RV size with RV EF 37%. 4. There is severe aortic insufficiency with regurgitant fraction 50% and regurgitant volume 59 mL. Unable to tell cusp number on the aortic valve from this study. 5. Delayed enhancement imaging with a small area of subendocardial LGE in the basal inferolateral wall that looks consistent with small prior MI. There is mid-wall LGE at the inferior RV  insertion site that is nonspecific and tends to be related to pressure/volume overload. 6. Elevated extracellular volume percentage at 35% suggests increased myocardial fibrotic content. Severe aortic insufficiency in the setting of dilated ascending aorta and aortic root with LV dilation. [name] Electronically Signed   By: Ezra Shuck M.D.   On: 09/11/2023 17:56   Recent Labs    09/10/23 1030 09/11/23 0913  WBC 10.2 8.3  HGB 8.7* 9.1*  HCT 26.6* 27.6*  PLT 252 306   Recent Labs    09/10/23 1030 09/11/23 0913  NA 131* 136  K 4.0 3.9  CL 97* 98  CO2 24 28  GLUCOSE 118* 196*  BUN 14 13  CREATININE 1.06 1.30*  CALCIUM  8.7* 9.1    Intake/Output Summary (Last 24 hours) at 09/12/2023 1434 Last data filed at 09/12/2023 1200 Gross per 24 hour  Intake 236 ml  Output 1425 ml  Net -1189 ml        Physical Exam: Vital Signs Blood pressure (!) 109/47, pulse 80, temperature 97.9 F (36.6 C), resp. rate 18, height 5' 11 (1.803 m), weight 75.4 kg, SpO2 94%.    General: awake, alert, after 3-4 minutes from being woken up; supine in bed;  NAD HENT: conjugate gaze; oropharynx dry- needed water CV: regular rate- rate controlled; no JVD Pulmonary: CTA B/L; no W/R/R- good air movement GI: soft, NT, ND, (+)BS- more normoactive Psychiatric: appropriate but groggy- slightly frustrated he was woke up Neurological: very groggy this AM- took 3-4 minutes to be able to answer questions appropriately- (last pain meds 521am) Skin: Bruising on b/l UE, Incision with dressing CDI, foam dressing both feet Neuro:     Mental Status: AAOx4, memory grossly intact Speech/Languate: fluent, follows commands CRANIAL NERVES:2-12  intact     MOTOR: RUE: 4+/5 Deltoid,4+/5 Biceps, 4+/5 Triceps,4+/5 Grip LUE: 4+/5 Deltoid, 4+/5 Biceps, 4+/5 Triceps, 4+/5 Grip RLE: HF 4-/5, KE 4+/5, ADF 0/5, APF 4/5 LLE: HF 4-/5, KE 4+/5, ADF 4/5, APF 4/5     REFLEXES: NO ankle clonus   SENSORY: Normal to  touch all 4 extremities   Coordination: No UE ataxia/dysmetria noted   Picc R arm, IV L arm   Assessment/Plan: 1. Functional deficits which require 3+ hours per day of interdisciplinary therapy in a comprehensive inpatient rehab setting. Physiatrist is providing close team supervision and 24 hour management of active medical problems listed below. Physiatrist and rehab team continue to assess barriers to discharge/monitor patient progress toward functional and medical goals  Care Tool:  Bathing    Body parts bathed by patient: Right arm, Left arm, Chest, Abdomen, Front perineal area, Face, Left upper leg, Right upper leg   Body parts bathed by helper: Right lower leg, Left lower leg, Buttocks     Bathing assist Assist Level: Moderate Assistance - Patient 50 - 74%  Upper Body Dressing/Undressing Upper body dressing   What is the patient wearing?: Pull over shirt, Orthosis Orthosis activity level: Performed by helper  Upper body assist Assist Level: Minimal Assistance - Patient > 75%    Lower Body Dressing/Undressing Lower body dressing      What is the patient wearing?: Pants     Lower body assist Assist for lower body dressing: Maximal Assistance - Patient 25 - 49%     Toileting Toileting    Toileting assist Assist for toileting: Moderate Assistance - Patient 50 - 74%     Transfers Chair/bed transfer  Transfers assist     Chair/bed transfer assist level: Minimal Assistance - Patient > 75%     Locomotion Ambulation   Ambulation assist      Assist level: Minimal Assistance - Patient > 75% Assistive device: Walker-rolling Max distance: 130   Walk 10 feet activity   Assist     Assist level: Minimal Assistance - Patient > 75% Assistive device: Walker-rolling   Walk 50 feet activity   Assist    Assist level: Minimal Assistance - Patient > 75% Assistive device: Walker-rolling    Walk 150 feet activity   Assist Walk 150 feet activity  did not occur: Safety/medical concerns         Walk 10 feet on uneven surface  activity   Assist Walk 10 feet on uneven surfaces activity did not occur: Safety/medical concerns         Wheelchair     Assist Is the patient using a wheelchair?: Yes Type of Wheelchair: Manual    Wheelchair assist level: Supervision/Verbal cueing Max wheelchair distance: 150    Wheelchair 50 feet with 2 turns activity    Assist        Assist Level: Contact Guard/Touching assist   Wheelchair 150 feet activity     Assist      Assist Level: Contact Guard/Touching assist   Blood pressure (!) 109/47, pulse 80, temperature 97.9 F (36.6 C), resp. rate 18, height 5' 11 (1.803 m), weight 75.4 kg, SpO2 94%.  Medical Problem List and Plan: 1. Functional deficits secondary to L1 burst fracture- with traumatic incomplete ASIA D paraplegia with cord compression status post T10-L4 fusion with transpedicular decompression L1.  Lumbar corset when out of bed applied in sitting position             -patient may shower             -ELOS/Goals: 10-12 days, PT/OT mina/sup             Con't CIR PT and OT  IPOC done  D/w nursing- pt wants purewick at night- unfortunately, since we are training pt to not have one at home, I'm not sure it's appropriate right now- if he cannot stay continent at night, can re-eval  Main limitation- LE weakness- can do Estim for R foot drop- has been >2 years since prostate CA 2.  Antithrombotics: -DVT/anticoagulation:  Pharmaceutical: Heparin  transitioning back to Xarelto  9/4- Xarelto  to start today             -antiplatelet therapy: N/A 3. Pain Management: Tramadol /hydrocodone  as needed, Robaxin  500 mg 4 times daily  9/5- Pt reports pain bothering him this AM- but hasn't had pain meds- asked nursing to get-  4. Mood/Behavior/Sleep: Provide emotional support             -antipsychotic agents: N/A 5. Neuropsych/cognition: This patient is capable of making  decisions on his own  behalf. 6. Skin/Wound Care: Routine skin checks 7. Fluids/Electrolytes/Nutrition: Routine in and outs with follow-up chemistries 8.  Acute blood loss anemia.  Follow-up CBC 9.  Shock/narrow complex tachycardia with hypotension.  Resolved.  Follow-up cardiology services 10.  Chronic systolic congestive heart failure/nonischemic CMP.  Demadex  20 mg daily, Aldactone  25 mg daily, Farxiga  10 mg daily.  Monitor for any fluid overload. Daily weights  9/4- Last weight yesterday 75 kg- will ask nursing to get weight daily  9/5- Weight 95.4 kg- stable 11.  CAD.  Toprol -XL 12.5 mg daily.  Monitor with increased mobility 12.  Atrial fibrillation.  Transitioning from IV heparin  back to Xarelto .  Continue amiodarone  200 mg twice daily for now.  Continue metoprolol   9/4- rate controlled 13.  CKD stage IIIA.  Follow-up chemistries.  Avoid nephrotoxic medications.  9/4- pt's Cr 1.30- is still in baseline range- con't to monitor 14.  Hyperlipidemia.  Crestor  15.  History of prostate cancer.  Flomax  0.4 mg daily.- per PA, was 2022- pt said doesn't remember when but more than 3 years ago. 16.  Constipation.  MiraLAX  twice daily             - LBM 9/2, continent  9/4- wants more miralax - has BID and prn- will make sure pt continues to get meds.   9/5- 1 BM today and one last evening- con't regimen 17.R foot drop-chronic, has AFO   9/4- from Hip surgery in January- went over with pt just the hip fx could have caused the R foot drop- we can do E stim- since has been since 2017 since prostate CA   I spent a total of  37  minutes on total care today- >50% coordination of care- due to  D/w team about pt- as well as nursing about possible Purewick- explained our goal is for pt to wake up to use urinal, he says he's too groggy- but my concern is what will he do at home? Also did IPOC  LOS: 2 days A FACE TO FACE EVALUATION WAS PERFORMED  Leeann Bady 09/12/2023, 2:34 PM

## 2023-09-12 NOTE — Care Management (Signed)
 Inpatient Rehabilitation Center Individual Statement of Services  Patient Name:  Aaron Richmond  Date:  09/12/2023  Welcome to the Inpatient Rehabilitation Center.  Our goal is to provide you with an individualized program based on your diagnosis and situation, designed to meet your specific needs.  With this comprehensive rehabilitation program, you will be expected to participate in at least 3 hours of rehabilitation therapies Monday-Friday, with modified therapy programming on the weekends.  Your rehabilitation program will include the following services:  Physical Therapy (PT), Occupational Therapy (OT), 24 hour per day rehabilitation nursing, Therapeutic Recreaction (TR), Psychology, Neuropsychology, Care Coordinator, Rehabilitation Medicine, Nutrition Services, Pharmacy Services, and Other  Weekly team conferences will be held on Tuesday to discuss your progress.  Your Inpatient Rehabilitation Care Coordinator will talk with you frequently to get your input and to update you on team discussions.  Team conferences with you and your family in attendance may also be held.  Expected length of stay: 10-14 days    Overall anticipated outcome: Supervision  Depending on your progress and recovery, your program may change. Your Inpatient Rehabilitation Care Coordinator will coordinate services and will keep you informed of any changes. Your Inpatient Rehabilitation Care Coordinator's name and contact numbers are listed  below.  The following services may also be recommended but are not provided by the Inpatient Rehabilitation Center:  Driving Evaluations Home Health Rehabiltiation Services Outpatient Rehabilitation Services Vocational Rehabilitation   Arrangements will be made to provide these services after discharge if needed.  Arrangements include referral to agencies that provide these services.  Your insurance has been verified to be:  [employer]  Your primary doctor is:   Selinda Salinas Eyk  Pertinent information will be shared with your doctor and your insurance company.  Inpatient Rehabilitation Care Coordinator:  Graeme Jude, KEN [phone] or [phone]  Information discussed with and copy given to patient by: Graeme DELENA Jude, 09/12/2023, 11:27 AM

## 2023-09-12 NOTE — IPOC Note (Signed)
 Overall Plan of Care [facility]) Patient Details Name: Aaron Richmond MRN: [id number] DOB: 1945-09-23  Admitting Diagnosis: Acute incomplete paraplegia [facility])  Hospital Problems: Principal Problem:   Acute incomplete paraplegia [facility]) Active Problems:   Lumbar burst fracture (HCC)     Functional Problem List: Nursing Bladder, Bowel, Edema, Endurance, Medication Management, Pain, Safety, Skin Integrity  PT Balance, Endurance, Motor, Pain, Safety  OT Balance, Pain, Safety, Endurance, Motor  SLP    TR         Basic ADL's: OT Bathing, Dressing, Toileting     Advanced  ADL's: OT Simple Meal Preparation     Transfers: PT Bed to Chair, Bed Mobility, Car  OT Tub/Shower, Toilet     Locomotion: PT Ambulation     Additional Impairments: OT    SLP        TR      Anticipated Outcomes Item Anticipated Outcome  Self Feeding Independent  Swallowing      Basic self-care  Supervision/Modified Independent  Toileting  Modified Independent   Bathroom Transfers Supervision- shower; Modified Independent toilet  Bowel/Bladder  manage bowels with medications/ manage bladder with toileting assistance  Transfers  supervision with LRAD  Locomotion  supervision with LRAD  Communication     Cognition     Pain  <4 w/ prns  Safety/Judgment  manage safety with supervision assistance   Therapy Plan: PT Intensity: Minimum of 1-2 x/day ,45 to 90 minutes PT Frequency: 5 out of 7 days PT Duration Estimated Length of Stay: 10 days OT Intensity: Minimum of 1-2 x/day, 45 to 90 minutes OT Frequency: 5 out of 7 days OT Duration/Estimated Length of Stay: 10-14     Team Interventions: Nursing Interventions Patient/Family Education, Medication Management, Bladder Management, Bowel Management, Disease Management/Prevention, Pain Management, Discharge Planning, Skin Care/Wound Management  PT interventions Community reintegration, Ambulation/gait training, Warden/ranger,  Discharge planning, Functional electrical stimulation, Pain management, Stair training, Neuromuscular re-education, DME/adaptive equipment instruction, Psychosocial support, UE/LE Strength taining/ROM, Wheelchair propulsion/positioning, UE/LE Coordination activities, Therapeutic Activities, Splinting/orthotics, Therapeutic Exercise, Patient/family education, Functional mobility training, Disease management/prevention, Cognitive remediation/compensation, Skin care/wound management  OT Interventions Warden/ranger, Community reintegration, Disease mangement/prevention, Self Care/advanced ADL retraining, Patient/family education, Therapeutic Exercise, Therapeutic Activities, Pain management, Functional mobility training, DME/adaptive equipment instruction, Discharge planning  SLP Interventions    TR Interventions    SW/CM Interventions Discharge Planning, Psychosocial Support, Patient/Family Education   Barriers to Discharge MD  Medical stability, Home enviroment access/loayout, Incontinence, Wound care, Lack of/limited family support, and Weight bearing restrictions  Nursing Decreased caregiver support, Home environment access/layout, Incontinence Discharge: House  Discharge Home Layout: One level  Discharge Home Access: Stairs to enter  Entrance Stairs-Rails: Right, Left  Entrance Stairs-Number of Steps: 1  PT Home environment access/layout    OT      SLP      SW Decreased caregiver support, Lack of/limited family support, Community education officer for SNF coverage     Team Discharge Planning: Destination: PT-Home ,OT- Home , SLP-  Projected Follow-up: PT-Outpatient PT, OT-  Home health OT, SLP-  Projected Equipment Needs: PT-To be determined, OT- 3 in 1 bedside comode, Tub/shower seat, Rolling walker with 5 wheels, SLP-  Equipment Details: PT-owns RW, OT-Dressing equipment Patient/family involved in discharge planning: PT- Patient,  OT-Patient, Family member/caregiver, SLP-   MD ELOS: 10-14  days Medical Rehab Prognosis:  Good Assessment: The patient has been admitted for CIR therapies with the diagnosis of Incomplete paraplegia due to L1 burst fx with cord compression. The  team will be addressing functional mobility, strength, stamina, balance, safety, adaptive techniques and equipment, self-care, bowel and bladder mgt, patient and caregiver education, . Goals have been set at supervision. Anticipated discharge destination is home.        See Team Conference Notes for weekly updates to the plan of care

## 2023-09-12 NOTE — Progress Notes (Signed)
 Chaplain met with [name] who stated that he had given HCPOA paperwork to a chaplain and that it hadn't yet been returned. He explained that his wife Aaron Richmond handed it to the chaplain.  After speaking with Aaron Richmond, she clarified that they gave the paperwork to the SW to be uploaded. She also informed me that Aaron Richmond has a Dietitian of a Desire for a Natural Death document that she intends to bring in and have uploaded.  I found the HCPOA document lying on Aaron Richmond' tray and uploaded it to [email or web address]. RN also notified. Aaron Richmond wishes for his wife to serve as primary HCPOA and his brother to serve as a backup.  Chaplains remain available for any further needs.

## 2023-09-13 DIAGNOSIS — K5901 Slow transit constipation: Secondary | ICD-10-CM

## 2023-09-13 DIAGNOSIS — D649 Anemia, unspecified: Secondary | ICD-10-CM

## 2023-09-13 NOTE — Progress Notes (Signed)
 PROGRESS NOTE   Subjective/Complaints:  Pt doing well, slept well, pain well managed, LBM this morning, urinating fine. No other complaints or concerns.     ROS:  Pt denies SOB, abd pain, CP, N/V/C/D, and vision changes  Per HPI   Objective:   MR CARDIAC VELOCITY FLOW MAP Result Date: 09/11/2023 CLINICAL DATA:  Cardiomyopathy of uncertain etiology EXAM: CARDIAC MRI TECHNIQUE: The patient was scanned on a 1.5 Tesla GE magnet. A dedicated cardiac coil was used. Functional imaging was done using Fiesta sequences. 2,3, and 4 chamber views were done to assess for RWMA's. Modified Simpson's rule using a short axis stack was used to calculate an ejection fraction on a dedicated work Research officer, trade [employer]. The patient received 10 cc of Gadavist . After 10 minutes inversion recovery sequences were used to assess for infiltration and scar tissue. FINDINGS: Limited images of the lung fields showed no gross abnormalities. 51 mm ascending aorta. The left ventricle is severely dilated with normal wall thickness. Mild-moderate diffuse hypokinesis, LV EF 40%. Normal right ventricular size with RV EF 37%. Severely dilated left atrium, mildly dilated right atrium. The aortic valve is not visualized well enough to comment on number of leaflets. There is severe aortic insufficiency with regurgitant volume 59 mL and regurgitant fraction 50%. No significant mitral regurgitation (regurgitant fraction < 5%). On delayed enhancement imaging, there is a small area of <50% wall thickness subendocardial late gadolinium enhancement in the basal inferolateral wall. There is a small area of mid-wall LGE in the basal inferoseptal wall at the RV insertion site. MEASUREMENTS: MEASUREMENTS LVEDV 304 mL LVEDVi 157 mL/m2 LVSV 122 mL LVEF 40% RVEDV 93 mL RVEDVi 48 mL/m2 RVSV 35 mL RVEF 37% Aortic forward volume 117 mL Aortic regurgitant volume 59 mL, regurgitant fraction  50% Global T1 1093, ECV 35% IMPRESSION: 1.  Severely dilated ascending aorta and root, 51 mm. 2. Severely dilated left ventricle with normal wall thickness, global hypokinesis with LV EF 40%. 3.  Normal RV size with RV EF 37%. 4. There is severe aortic insufficiency with regurgitant fraction 50% and regurgitant volume 59 mL. Unable to tell cusp number on the aortic valve from this study. 5. Delayed enhancement imaging with a small area of subendocardial LGE in the basal inferolateral wall that looks consistent with small prior MI. There is mid-wall LGE at the inferior RV insertion site that is nonspecific and tends to be related to pressure/volume overload. 6. Elevated extracellular volume percentage at 35% suggests increased myocardial fibrotic content. Severe aortic insufficiency in the setting of dilated ascending aorta and aortic root with LV dilation. [name] Electronically Signed   By: Ezra Shuck M.D.   On: 09/11/2023 17:56   MR CARDIAC VELOCITY FLOW MAP Result Date: 09/11/2023 CLINICAL DATA:  Cardiomyopathy of uncertain etiology EXAM: CARDIAC MRI TECHNIQUE: The patient was scanned on a 1.5 Tesla GE magnet. A dedicated cardiac coil was used. Functional imaging was done using Fiesta sequences. 2,3, and 4 chamber views were done to assess for RWMA's. Modified Simpson's rule using a short axis stack was used to calculate an ejection fraction on a dedicated work Research officer, trade union. The patient received 10 cc  of Gadavist . After 10 minutes inversion recovery sequences were used to assess for infiltration and scar tissue. FINDINGS: Limited images of the lung fields showed no gross abnormalities. 51 mm ascending aorta. The left ventricle is severely dilated with normal wall thickness. Mild-moderate diffuse hypokinesis, LV EF 40%. Normal right ventricular size with RV EF 37%. Severely dilated left atrium, mildly dilated right atrium. The aortic valve is not visualized well enough to comment on  number of leaflets. There is severe aortic insufficiency with regurgitant volume 59 mL and regurgitant fraction 50%. No significant mitral regurgitation (regurgitant fraction < 5%). On delayed enhancement imaging, there is a small area of <50% wall thickness subendocardial late gadolinium enhancement in the basal inferolateral wall. There is a small area of mid-wall LGE in the basal inferoseptal wall at the RV insertion site. MEASUREMENTS: MEASUREMENTS LVEDV 304 mL LVEDVi 157 mL/m2 LVSV 122 mL LVEF 40% RVEDV 93 mL RVEDVi 48 mL/m2 RVSV 35 mL RVEF 37% Aortic forward volume 117 mL Aortic regurgitant volume 59 mL, regurgitant fraction 50% Global T1 1093, ECV 35% IMPRESSION: 1.  Severely dilated ascending aorta and root, 51 mm. 2. Severely dilated left ventricle with normal wall thickness, global hypokinesis with LV EF 40%. 3.  Normal RV size with RV EF 37%. 4. There is severe aortic insufficiency with regurgitant fraction 50% and regurgitant volume 59 mL. Unable to tell cusp number on the aortic valve from this study. 5. Delayed enhancement imaging with a small area of subendocardial LGE in the basal inferolateral wall that looks consistent with small prior MI. There is mid-wall LGE at the inferior RV insertion site that is nonspecific and tends to be related to pressure/volume overload. 6. Elevated extracellular volume percentage at 35% suggests increased myocardial fibrotic content. Severe aortic insufficiency in the setting of dilated ascending aorta and aortic root with LV dilation. [name] Electronically Signed   By: Ezra Shuck M.D.   On: 09/11/2023 17:56   MR CARDIAC MORPHOLOGY W WO CONTRAST Result Date: 09/11/2023 CLINICAL DATA:  Cardiomyopathy of uncertain etiology EXAM: CARDIAC MRI TECHNIQUE: The patient was scanned on a 1.5 Tesla GE magnet. A dedicated cardiac coil was used. Functional imaging was done using Fiesta sequences. 2,3, and 4 chamber views were done to assess for RWMA's. Modified  Simpson's rule using a short axis stack was used to calculate an ejection fraction on a dedicated work Research officer, trade union. The patient received 10 cc of Gadavist . After 10 minutes inversion recovery sequences were used to assess for infiltration and scar tissue. FINDINGS: Limited images of the lung fields showed no gross abnormalities. 51 mm ascending aorta. The left ventricle is severely dilated with normal wall thickness. Mild-moderate diffuse hypokinesis, LV EF 40%. Normal right ventricular size with RV EF 37%. Severely dilated left atrium, mildly dilated right atrium. The aortic valve is not visualized well enough to comment on number of leaflets. There is severe aortic insufficiency with regurgitant volume 59 mL and regurgitant fraction 50%. No significant mitral regurgitation (regurgitant fraction < 5%). On delayed enhancement imaging, there is a small area of <50% wall thickness subendocardial late gadolinium enhancement in the basal inferolateral wall. There is a small area of mid-wall LGE in the basal inferoseptal wall at the RV insertion site. MEASUREMENTS: MEASUREMENTS LVEDV 304 mL LVEDVi 157 mL/m2 LVSV 122 mL LVEF 40% RVEDV 93 mL RVEDVi 48 mL/m2 RVSV 35 mL RVEF 37% Aortic forward volume 117 mL Aortic regurgitant volume 59 mL, regurgitant fraction 50% Global T1 1093, ECV 35%  IMPRESSION: 1.  Severely dilated ascending aorta and root, 51 mm. 2. Severely dilated left ventricle with normal wall thickness, global hypokinesis with LV EF 40%. 3.  Normal RV size with RV EF 37%. 4. There is severe aortic insufficiency with regurgitant fraction 50% and regurgitant volume 59 mL. Unable to tell cusp number on the aortic valve from this study. 5. Delayed enhancement imaging with a small area of subendocardial LGE in the basal inferolateral wall that looks consistent with small prior MI. There is mid-wall LGE at the inferior RV insertion site that is nonspecific and tends to be related to pressure/volume  overload. 6. Elevated extracellular volume percentage at 35% suggests increased myocardial fibrotic content. Severe aortic insufficiency in the setting of dilated ascending aorta and aortic root with LV dilation. [name] Electronically Signed   By: Ezra Shuck M.D.   On: 09/11/2023 17:56   Recent Labs    09/11/23 0913  WBC 8.3  HGB 9.1*  HCT 27.6*  PLT 306   Recent Labs    09/11/23 0913  NA 136  K 3.9  CL 98  CO2 28  GLUCOSE 196*  BUN 13  CREATININE 1.30*  CALCIUM  9.1    Intake/Output Summary (Last 24 hours) at 09/13/2023 1235 Last data filed at 09/13/2023 0900 Gross per 24 hour  Intake 100 ml  Output 750 ml  Net -650 ml        Physical Exam: Vital Signs Blood pressure (!) 115/47, pulse 70, temperature 97.8 F (36.6 C), temperature source Oral, resp. rate 17, height 5' 11 (1.803 m), weight 75.4 kg, SpO2 97%.    General: awake, alert, laying in bed;  NAD HENT: conjugate gaze; oropharynx moist CV: regular rate- rate controlled; no JVD, no m/r/g appreciated Pulmonary: CTA B/L; no W/R/R- good air movement GI: soft, NT, ND, (+)BS- normoactive Psychiatric: appropriate, pleasant Skin: Bruising on b/l UE, Incision with dressing CDI, foam dressing both feet\  PRIOR EXAMS: Neurological: very groggy this AM- took 3-4 minutes to be able to answer questions appropriately- (last pain meds 521am)  Neuro:     Mental Status: AAOx4, memory grossly intact Speech/Languate: fluent, follows commands CRANIAL NERVES:2-12  intact     MOTOR: RUE: 4+/5 Deltoid,4+/5 Biceps, 4+/5 Triceps,4+/5 Grip LUE: 4+/5 Deltoid, 4+/5 Biceps, 4+/5 Triceps, 4+/5 Grip RLE: HF 4-/5, KE 4+/5, ADF 0/5, APF 4/5 LLE: HF 4-/5, KE 4+/5, ADF 4/5, APF 4/5     REFLEXES: NO ankle clonus   SENSORY: Normal to touch all 4 extremities   Coordination: No UE ataxia/dysmetria noted   Picc R arm, IV L arm   Assessment/Plan: 1. Functional deficits which require 3+ hours per day of interdisciplinary  therapy in a comprehensive inpatient rehab setting. Physiatrist is providing close team supervision and 24 hour management of active medical problems listed below. Physiatrist and rehab team continue to assess barriers to discharge/monitor patient progress toward functional and medical goals  Care Tool:  Bathing    Body parts bathed by patient: Right arm, Left arm, Chest, Abdomen, Front perineal area, Face, Left upper leg, Right upper leg   Body parts bathed by helper: Right lower leg, Left lower leg, Buttocks     Bathing assist Assist Level: Moderate Assistance - Patient 50 - 74%     Upper Body Dressing/Undressing Upper body dressing   What is the patient wearing?: Pull over shirt, Orthosis Orthosis activity level: Performed by helper  Upper body assist Assist Level: Minimal Assistance - Patient > 75%  Lower Body Dressing/Undressing Lower body dressing      What is the patient wearing?: Pants     Lower body assist Assist for lower body dressing: Maximal Assistance - Patient 25 - 49%     Toileting Toileting    Toileting assist Assist for toileting: Moderate Assistance - Patient 50 - 74%     Transfers Chair/bed transfer  Transfers assist     Chair/bed transfer assist level: Minimal Assistance - Patient > 75%     Locomotion Ambulation   Ambulation assist      Assist level: Minimal Assistance - Patient > 75% Assistive device: Walker-rolling Max distance: 130   Walk 10 feet activity   Assist     Assist level: Minimal Assistance - Patient > 75% Assistive device: Walker-rolling   Walk 50 feet activity   Assist    Assist level: Minimal Assistance - Patient > 75% Assistive device: Walker-rolling    Walk 150 feet activity   Assist Walk 150 feet activity did not occur: Safety/medical concerns         Walk 10 feet on uneven surface  activity   Assist Walk 10 feet on uneven surfaces activity did not occur: Safety/medical concerns          Wheelchair     Assist Is the patient using a wheelchair?: Yes Type of Wheelchair: Manual    Wheelchair assist level: Supervision/Verbal cueing Max wheelchair distance: 150    Wheelchair 50 feet with 2 turns activity    Assist        Assist Level: Contact Guard/Touching assist   Wheelchair 150 feet activity     Assist      Assist Level: Contact Guard/Touching assist   Blood pressure (!) 115/47, pulse 70, temperature 97.8 F (36.6 C), temperature source Oral, resp. rate 17, height 5' 11 (1.803 m), weight 75.4 kg, SpO2 97%.  Medical Problem List and Plan: 1. Functional deficits secondary to L1 burst fracture- with traumatic incomplete ASIA D paraplegia with cord compression status post T10-L4 fusion with transpedicular decompression L1.  Lumbar corset when out of bed applied in sitting position             -patient may shower             -ELOS/Goals: 10-12 days, PT/OT mina/sup             Con't CIR PT and OT  IPOC done D/w nursing- pt wants purewick at night- unfortunately, since we are training pt to not have one at home, I'm not sure it's appropriate right now- if he cannot stay continent at night, can re-eval Main limitation- LE weakness- can do Estim for R foot drop- has been >2 years since prostate CA 2.  Antithrombotics: -DVT/anticoagulation:  Pharmaceutical: Heparin  transitioning back to Xarelto  9/4- Xarelto  20mg  daily to start today             -antiplatelet therapy: N/A 3. Pain Management: Tramadol /hydrocodone  as needed, Robaxin  500 mg 4 times daily 9/5- Pt reports pain bothering him this AM- but hasn't had pain meds- asked nursing to get-  4. Mood/Behavior/Sleep: Provide emotional support             -antipsychotic agents: N/A 5. Neuropsych/cognition: This patient is capable of making decisions on his own behalf. 6. Skin/Wound Care: Routine skin checks 7. Fluids/Electrolytes/Nutrition: Routine in and outs with follow-up chemistries. Continue K  supplementation, vitamins, and protonix  40mg  daily 8.  Acute blood loss anemia.  Follow-up CBC. Hgb stable 9.1  on 9/4 9.  Shock/narrow complex tachycardia with hypotension.  Resolved.  Follow-up cardiology services 10.  Chronic systolic congestive heart failure/nonischemic CMP.  Demadex  20 mg daily, Aldactone  25 mg daily, Farxiga  10 mg daily.  Monitor for any fluid overload. Daily weights  9/4- Last weight yesterday 75 kg- will ask nursing to get weight daily  9/5- Weight 75.4 kg- stable  -09/13/23 no weight done, but euvolemic appearing; monitor Filed Weights   09/10/23 1708 09/12/23 0500  Weight: 75 kg 75.4 kg    11.  CAD.  Toprol -XL 12.5 mg BID.  Monitor with increased mobility 12.  Atrial fibrillation.  Transitioning from IV heparin  back to Xarelto .  Continue amiodarone  200 mg twice daily for now.  Continue metoprolol   9/4-6 rate controlled 13.  CKD stage IIIA.  Follow-up chemistries.  Avoid nephrotoxic medications.  9/4- pt's Cr 1.30- is still in baseline range- con't to monitor 14.  Hyperlipidemia.  Crestor  10mg  daily 15.  History of prostate cancer.  Flomax  0.4 mg daily.- per PA, was 2022- pt said doesn't remember when but more than 3 years ago. 16.  Constipation.  MiraLAX  twice daily             - LBM 9/2, continent 9/4- wants more miralax - has BID and prn- will make sure pt continues to get meds.   9/5- 1 BM today and one last evening- con't regimen  -09/13/23 LBM this morning, cont regimen 17.R foot drop-chronic, has AFO 9/4- from Hip surgery in January- went over with pt just the hip fx could have caused the R foot drop- we can do E stim- since has been since 2017 since prostate CA    LOS: 3 days A FACE TO FACE EVALUATION WAS PERFORMED  [address] 09/13/2023, 12:35 PM

## 2023-09-14 NOTE — Progress Notes (Signed)
 PICC line was removed per order with no complications.  Pt supine and suspended inspiration during removal with instructions to remain horizontal for 30 minutes after removal.  Pressure held to achieve hemostasis.  Vaseline/gauze/tegaderm applied.  Patient education provided regarding lifting restrictions, site care, and signs of infection.  Pt verbalized understanding and had no questions.

## 2023-09-14 NOTE — Progress Notes (Signed)
 Occupational Therapy Session Note  Patient Details  Name: Aaron Richmond MRN: [id number] Date of Birth: 1945-06-13  Today's Date: 09/14/2023 OT Individual Time: 0800-0903 OT Individual Time Calculation (min): 63 min    Short Term Goals: Week 1:  OT Short Term Goal 1 (Week 1): Min assist with AE for LE dressing OT Short Term Goal 2 (Week 1): Patient to don LSO with set up and verbal cues OT Short Term Goal 3 (Week 1): Patient to perform bathing with Min assist using AE PRN OT Short Term Goal 4 (Week 1): Patient to perform grooming standing at sink with close supervision  Skilled Therapeutic Interventions/Progress Updates: Patient received resting in bed. Agreeable to OT treatment. Assisted patient with am ADL routine following and educating on precautions. See below for ADL levels. Patient with good motivation to improve self care to LTG;s of Mod I. Continue with skilled OT POC to promote safe and independent self care and transfers.      Therapy Documentation Precautions:  Precautions Precautions: Fall, Back Recall of Precautions/Restrictions: Intact Required Braces or Orthoses: Spinal Brace, Other Brace Spinal Brace: Lumbar corset Other Brace: AFO RLE Restrictions Weight Bearing Restrictions Per Provider Order: No General:   Vital Signs:   Pain: Pain Assessment Pain Scale: 0-10 Pain Score: 7  Pain Location: Back, resolving HA Pain Intervention(s): Medication (See eMAR) ADL: ADL Equipment Provided: Reacher Grooming: Setup Where Assessed-Grooming: Sitting at sink Upper Body Bathing: Set-up assistance Where Assessed-Upper Body Bathing: shower Lower Body Bathing: Min assist Where Assessed-Lower Body Bathing: shower Upper Body Dressing: Minimal assistance with LSO Where Assessed-Upper Body Dressing: Edge of bed Lower Body Dressing: Max A Where Assessed-Lower Body Dressing: Edge of bed Toileting: Min assistance Where Assessed-Toileting: Clinical biochemist Method: Proofreader: Raised toilet seat, Bedside commode, Acupuncturist: CGA Film/video editor Method: Ambulating      Therapy/Group: Individual Therapy  Aaron Richmond 09/14/2023, 12:50 PM

## 2023-09-14 NOTE — Progress Notes (Signed)
 Physical Therapy Session Note  Patient Details  Name: Aaron Richmond MRN: [id number] Date of Birth: May 26, 1945  Today's Date: 09/14/2023 PT Individual Time: 1020-1120 PT Individual Time Calculation (min): 60 min   Short Term Goals: Week 1:  PT Short Term Goal 1 (Week 1): Pt will complete 3 consecutive sit to stand transfers w/ CGA PT Short Term Goal 2 (Week 1): Pt will perform 4 stairs w/ one handrail and side-stepping w/ minA PT Short Term Goal 3 (Week 1): Pt will demonstrate proper technique for sit to stand, using BUE to push off WC and not RW, 3 consecutive transfers in a row.  Skilled Therapeutic Interventions/Progress Updates:    pt received in bed and agreeable to therapy. Pt reports 7/10 pain in back, recd pain medication from nsg during session. Therapy to tolerance.  Bed mobility with supervision. Donned ted hose and shoes/AFO with tot a for time. Pt ambulated to/from day room with CGA. Provided cues for upright posture and incr step length with mod success and frequent cueing required. Pt then ambulated 170 ft with same assist to build endurance and improve gait mechanics through repetition. Sit to stand with as much as mod a, but more consistently min to near CGA throughout session.  Pt performed 3 x 5 BIL step ups on 6 step with RW and CGA for LE strength and endurance.  Pt with questions about prognosis and follow up, answered to best of this therapist's ability at this time. Pt returned to bed with CGA and was left with all needs in reach and alarm active.   Therapy Documentation Precautions:  Precautions Precautions: Fall, Back Recall of Precautions/Restrictions: Intact Required Braces or Orthoses: Spinal Brace, Other Brace Spinal Brace: Lumbar corset Other Brace: AFO RLE Restrictions Weight Bearing Restrictions Per Provider Order: No General:       Therapy/Group: Individual Therapy  [name] 09/14/2023, 11:03 AM

## 2023-09-14 NOTE — Progress Notes (Signed)
 Physical Therapy Session Note  Patient Details  Name: Aaron Richmond MRN: [id number] Date of Birth: 1945/02/15  Today's Date: 09/14/2023 PT Individual Time: 8585-8474 PT Individual Time Calculation (min): 71 min   Short Term Goals: Week 1:  PT Short Term Goal 1 (Week 1): Pt will complete 3 consecutive sit to stand transfers w/ CGA PT Short Term Goal 2 (Week 1): Pt will perform 4 stairs w/ one handrail and side-stepping w/ minA PT Short Term Goal 3 (Week 1): Pt will demonstrate proper technique for sit to stand, using BUE to push off WC and not RW, 3 consecutive transfers in a row.  Skilled Therapeutic Interventions/Progress Updates:    pt received in bed and agreeable to therapy. Pt reports 5/10 pain, nsg made aware, provided pain meds during session. Bed mobility with supervision, donned socks and AFO/shoes tot a for time. Sit to stand with max a initially, fading to CGA-min a.  Pt ambulated to day room, able to recall cues from earlier session, and apply intermittently. Pt then participated in 5 x 5 Sit to stand from elevated mat table with BUE pushing from mat table, CGA fading to supervision. Emphasis on BLE strength and independence with transfers.   Pt participated in nustep at level 1 for low pain/no pain exercise to release endorphins for pain relief and for global strength and endurance. 3 x 5 min with ~3 min rest breaks for fatigue management. Pt ambulated back to room with min-CGA and returned to bed with supervision, was left with all needs in reach and alarm active.   Therapy Documentation Precautions:  Precautions Precautions: Fall, Back Recall of Precautions/Restrictions: Intact Required Braces or Orthoses: Spinal Brace, Other Brace Spinal Brace: Lumbar corset Other Brace: AFO RLE Restrictions Weight Bearing Restrictions Per Provider Order: No General:      Therapy/Group: Individual Therapy  [name] 09/14/2023, 2:43 PM

## 2023-09-14 NOTE — Progress Notes (Signed)
 PROGRESS NOTE   Subjective/Complaints:  Aaron Richmond doing well again, slept well, pain well controlled, LBM this morning (not documented), urinating fine. No other complaints or concerns. PICC team asking if PICC can be removed-- as long as PIV works, then can remove.     ROS:  Aaron Richmond denies SOB, abd pain, CP, N/V/C/D, and vision changes  Per HPI   Objective:   No results found.  No results for input(s): WBC, HGB, HCT, PLT in the last 72 hours.  No results for input(s): NA, K, CL, CO2, GLUCOSE, BUN, CREATININE, CALCIUM  in the last 72 hours.   Intake/Output Summary (Last 24 hours) at 09/14/2023 1116 Last data filed at 09/14/2023 1050 Gross per 24 hour  Intake 594 ml  Output 1450 ml  Net -856 ml        Physical Exam: Vital Signs Blood pressure (!) 125/46, pulse 72, temperature 98.8 F (37.1 C), resp. rate 16, height 5' 11 (1.803 m), weight 76 kg, SpO2 94%.    General: awake, alert, laying in bed;  NAD, comfortable HENT: conjugate gaze; oropharynx moist CV: regular rate- rate controlled; no JVD, no m/r/g appreciated Pulmonary: CTA B/L; no W/R/R- good air movement GI: soft, NT, ND, (+)BS- normoactive Psychiatric: appropriate, pleasant Skin: Bruising on b/l UE, Incision with dressing CDI, foam dressing both feet  PRIOR EXAMS: Neurological: very groggy this AM- took 3-4 minutes to be able to answer questions appropriately- (last pain meds 521am)  Neuro:     Mental Status: AAOx4, memory grossly intact Speech/Languate: fluent, follows commands CRANIAL NERVES:2-12  intact     MOTOR: RUE: 4+/5 Deltoid,4+/5 Biceps, 4+/5 Triceps,4+/5 Grip LUE: 4+/5 Deltoid, 4+/5 Biceps, 4+/5 Triceps, 4+/5 Grip RLE: HF 4-/5, KE 4+/5, ADF 0/5, APF 4/5 LLE: HF 4-/5, KE 4+/5, ADF 4/5, APF 4/5     REFLEXES: NO ankle clonus   SENSORY: Normal to touch all 4 extremities   Coordination: No UE ataxia/dysmetria noted    Picc R arm, IV L arm   Assessment/Plan: 1. Functional deficits which require 3+ hours per day of interdisciplinary therapy in a comprehensive inpatient rehab setting. Physiatrist is providing close team supervision and 24 hour management of active medical problems listed below. Physiatrist and rehab team continue to assess barriers to discharge/monitor patient progress toward functional and medical goals  Care Tool:  Bathing    Body parts bathed by patient: Right arm, Left arm, Chest, Abdomen, Front perineal area, Face, Left upper leg, Right upper leg   Body parts bathed by helper: Right lower leg, Left lower leg, Buttocks     Bathing assist Assist Level: Minimal Assistance - Patient > 75%     Upper Body Dressing/Undressing Upper body dressing   What is the patient wearing?: Pull over shirt, Orthosis Orthosis activity level: Performed by helper  Upper body assist Assist Level: Minimal Assistance - Patient > 75%    Lower Body Dressing/Undressing Lower body dressing      What is the patient wearing?: Pants     Lower body assist Assist for lower body dressing: Moderate Assistance - Patient 50 - 74%     Toileting Toileting    Toileting assist Assist for toileting: Minimal Assistance -  Patient > 75%     Transfers Chair/bed transfer  Transfers assist     Chair/bed transfer assist level: Minimal Assistance - Patient > 75%     Locomotion Ambulation   Ambulation assist      Assist level: Minimal Assistance - Patient > 75% Assistive device: Walker-rolling Max distance: 130   Walk 10 feet activity   Assist     Assist level: Minimal Assistance - Patient > 75% Assistive device: Walker-rolling   Walk 50 feet activity   Assist    Assist level: Minimal Assistance - Patient > 75% Assistive device: Walker-rolling    Walk 150 feet activity   Assist Walk 150 feet activity did not occur: Safety/medical concerns         Walk 10 feet on uneven  surface  activity   Assist Walk 10 feet on uneven surfaces activity did not occur: Safety/medical concerns         Wheelchair     Assist Is the patient using a wheelchair?: Yes Type of Wheelchair: Manual    Wheelchair assist level: Supervision/Verbal cueing Max wheelchair distance: 150    Wheelchair 50 feet with 2 turns activity    Assist        Assist Level: Contact Guard/Touching assist   Wheelchair 150 feet activity     Assist      Assist Level: Contact Guard/Touching assist   Blood pressure (!) 125/46, pulse 72, temperature 98.8 F (37.1 C), resp. rate 16, height 5' 11 (1.803 m), weight 76 kg, SpO2 94%.  Medical Problem List and Plan: 1. Functional deficits secondary to L1 burst fracture- with traumatic incomplete ASIA D paraplegia with cord compression status post T10-L4 fusion with transpedicular decompression L1.  Lumbar corset when out of bed applied in sitting position             -patient may shower             -ELOS/Goals: 10-12 days, Aaron Richmond/OT mina/sup             Con't CIR Aaron Richmond and OT  IPOC done D/w nursing- Aaron Richmond wants purewick at night- unfortunately, since we are training Aaron Richmond to not have one at home, I'm not sure it's appropriate right now- if he cannot stay continent at night, can re-eval Main limitation- LE weakness- can do Estim for R foot drop- has been >2 years since prostate CA 2.  Antithrombotics: -DVT/anticoagulation:  Pharmaceutical: Heparin  transitioning back to Xarelto  9/4- Xarelto  20mg  daily to start today             -antiplatelet therapy: N/A 3. Pain Management: Tramadol /hydrocodone  as needed, Robaxin  500 mg 4 times daily 9/5- Aaron Richmond reports pain bothering him this AM- but hasn't had pain meds- asked nursing to get-  4. Mood/Behavior/Sleep: Provide emotional support             -antipsychotic agents: N/A 5. Neuropsych/cognition: This patient is capable of making decisions on his own behalf. 6. Skin/Wound Care: Routine skin checks 7.  Fluids/Electrolytes/Nutrition: Routine in and outs with follow-up chemistries. Continue K supplementation, vitamins, and protonix  40mg  daily 8.  Acute blood loss anemia.  Follow-up CBC. Hgb stable 9.1 on 9/4 9.  Shock/narrow complex tachycardia with hypotension.  Resolved.  Follow-up cardiology services 10.  Chronic systolic congestive heart failure/nonischemic CMP.  Demadex  20 mg daily, Aldactone  25 mg daily, Farxiga  10 mg daily.  Monitor for any fluid overload. Daily weights  9/4- Last weight yesterday 75 kg- will ask nursing to get weight  daily  9/5- Weight 75.4 kg- stable  -09/14/23 wt stable today. Monitor.  Filed Weights   09/10/23 1708 09/12/23 0500 09/14/23 0448  Weight: 75 kg 75.4 kg 76 kg    11.  CAD.  Toprol -XL 12.5 mg BID.  Monitor with increased mobility 12.  Atrial fibrillation.  Transitioning from IV heparin  back to Xarelto .  Continue amiodarone  200 mg twice daily for now.  Continue metoprolol   9/4-7 rate controlled 13.  CKD stage IIIA.  Follow-up chemistries.  Avoid nephrotoxic medications.  9/4- Aaron Richmond's Cr 1.30- is still in baseline range- con't to monitor 14.  Hyperlipidemia.  Crestor  10mg  daily 15.  History of prostate cancer.  Flomax  0.4 mg daily.- per PA, was 2022- Aaron Richmond said doesn't remember when but more than 3 years ago. 16.  Constipation.  MiraLAX  twice daily             - LBM 9/2, continent 9/4- wants more miralax - has BID and prn- will make sure Aaron Richmond continues to get meds.   9/5- 1 BM today and one last evening- con't regimen  -09/14/23 LBM this morning, not documented, cont regimen 17.R foot drop-chronic, has AFO 9/4- from Hip surgery in January- went over with Aaron Richmond just the hip fx could have caused the R foot drop- we can do E stim- since has been since 2017 since prostate CA    LOS: 4 days A FACE TO FACE EVALUATION WAS PERFORMED  [address] 09/14/2023, 11:16 AM

## 2023-09-15 DIAGNOSIS — F54 Psychological and behavioral factors associated with disorders or diseases classified elsewhere: Secondary | ICD-10-CM

## 2023-09-15 LAB — CBC
HCT: 27.6 % — ABNORMAL LOW (ref 39.0–52.0)
Hemoglobin: 9.1 g/dL — ABNORMAL LOW (ref 13.0–17.0)
MCH: 30.7 pg (ref 26.0–34.0)
MCHC: 33 g/dL (ref 30.0–36.0)
MCV: 93.2 fL (ref 80.0–100.0)
Platelets: 451 K/uL — ABNORMAL HIGH (ref 150–400)
RBC: 2.96 MIL/uL — ABNORMAL LOW (ref 4.22–5.81)
RDW: 15.3 % (ref 11.5–15.5)
WBC: 11 K/uL — ABNORMAL HIGH (ref 4.0–10.5)
nRBC: 0 % (ref 0.0–0.2)

## 2023-09-15 LAB — BASIC METABOLIC PANEL WITH GFR
Anion gap: 12 (ref 5–15)
BUN: 26 mg/dL — ABNORMAL HIGH (ref 8–23)
CO2: 26 mmol/L (ref 22–32)
Calcium: 9.1 mg/dL (ref 8.9–10.3)
Chloride: 97 mmol/L — ABNORMAL LOW (ref 98–111)
Creatinine, Ser: 1.67 mg/dL — ABNORMAL HIGH (ref 0.61–1.24)
GFR, Estimated: 42 mL/min — ABNORMAL LOW (ref >60)
Glucose, Bld: 103 mg/dL — ABNORMAL HIGH (ref 70–99)
Potassium: 4.4 mmol/L (ref 3.5–5.1)
Sodium: 135 mmol/L (ref 135–145)

## 2023-09-15 NOTE — Progress Notes (Signed)
 Physical Therapy Session Note  Patient Details  Name: Aaron Richmond MRN: [id number] Date of Birth: [birth year]  Today's Date: 09/15/2023 PT Individual Time: 1130-1200 PT Individual Time Calculation (min): 30 min   Short Term Goals: Week 1:  PT Short Term Goal 1 (Week 1): Pt will complete 3 consecutive sit to stand transfers w/ CGA PT Short Term Goal 2 (Week 1): Pt will perform 4 stairs w/ one handrail and side-stepping w/ minA PT Short Term Goal 3 (Week 1): Pt will demonstrate proper technique for sit to stand, using BUE to push off WC and not RW, 3 consecutive transfers in a row.  Skilled Therapeutic Interventions/Progress Updates:    Pt sitting EOB with Nsg when PT arrived.  Pt agreeable for session.  PT donned back brace with min A for adjustment only. Pt performed sit->stand->w/c, (bed to RW  to w/c) with supervision.  Pt concerned if PT has ever dropped anyone. Pt states his back pain 7/10.   Gait tx with RW x~140' before rest.  Focusing on increasing RLE stride length, upright posture, keeping head up.  Pt able to return demo, however, difficulty sustaining upright posture.  Pt becomes fatigued and will drag/shuffled RLE, cueing needed to correct. After extended rest in w/c, pt amb x80' before rest again.  PT wheeled pt to main gym for LE ex's in w/c: RLE marching/hip flexion with 1.5# ankle wts 2x10 reps. Due to limited time, PT wheeled pt to west nursing station, pt amb back to room ~x85'.  Once in room, pt agreed to sit in w/c for lunch, all items within reach.  Pt able to follow PT instructions, no complaint of back pain, with exception of right AFO digging into his heel intermittently.    Therapy Documentation Precautions:  Precautions Precautions: Fall, Back Recall of Precautions/Restrictions: Intact Required Braces or Orthoses: Spinal Brace, Other Brace Spinal Brace: Lumbar corset Other Brace: AFO RLE Restrictions Weight Bearing Restrictions Per Provider Order:  No      Therapy/Group: Individual Therapy  [name] Fast 09/15/2023, 4:39 PM

## 2023-09-15 NOTE — Progress Notes (Signed)
 Occupational Therapy Session Note  Patient Details  Name: Aaron Richmond MRN: [id number] Date of Birth: August 04, 1945  Today's Date: 09/15/2023 OT Individual Time: 1445-1530 OT Individual Time Calculation (min): 45 min    Short Term Goals: Week 1:  OT Short Term Goal 1 (Week 1): Min assist with AE for LE dressing OT Short Term Goal 2 (Week 1): Patient to don LSO with set up and verbal cues OT Short Term Goal 3 (Week 1): Patient to perform bathing with Min assist using AE PRN OT Short Term Goal 4 (Week 1): Patient to perform grooming standing at sink with close supervision  Skilled Therapeutic Interventions/Progress Updates:      Therapy Documentation Precautions:  Precautions Precautions: Fall, Back Recall of Precautions/Restrictions: Intact Required Braces or Orthoses: Spinal Brace, Other Brace Spinal Brace: Lumbar corset Other Brace: AFO RLE Restrictions Weight Bearing Restrictions Per Provider Order: No General: Pt seated in W/C upon OT arrival, agreeable to OT, although reporting increased fatigue. LSO and AFO already donned.   Pain:  7/10 pain reported in back, activity, intermittent rest breaks, distractions provided for pain management, pt reports tolerable to proceed.   ADL: OT providing skilled intervention for ADL/IADL tasks in ADL suite. OT instructing patient on finding items throughout cabinets, fridge, and pantry at various heights and reaching out of BOS up/down within back precautions. Pt able to complete task at CGA-Min A with VC for safety when completing ADL tasks and correct angles to take when opening fridge/cabinets. OT educating patient on kitchen safety while patient completing task.  OT also issuing leg lifter d/t pt reporting he has one at home and has increased difficulty with managing LB ADLs. OT also educating pt on foot up brace d/t pt reporting pain in heel from AFO. OT also educating on potential for custom made AFO in order to tailor to pt needs.   Pt  supine in bed (completed with SBA) with bed alarm activated, 2 bed rails up, call light within reach and 4Ps assessed.   Therapy/Group: Individual Therapy  Camie Hoe, OTD, OTR/L 09/15/2023, 3:46 PM

## 2023-09-15 NOTE — Plan of Care (Signed)
   Problem: Education: Goal: Knowledge of General Education information will improve Description Including pain rating scale, medication(s)/side effects and non-pharmacologic comfort measures Outcome: Progressing   Problem: Health Behavior/Discharge Planning: Goal: Ability to manage health-related needs will improve Outcome: Progressing

## 2023-09-15 NOTE — Progress Notes (Signed)
 Occupational Therapy Session Note  Patient Details  Name: Aaron Richmond MRN: [id number] Date of Birth: Sep 06, 1945  Today's Date: 09/15/2023 OT Individual Time: 1300-1340 OT Individual Time Calculation (min): 40 min    Short Term Goals: Week 1:  OT Short Term Goal 1 (Week 1): Min assist with AE for LE dressing OT Short Term Goal 2 (Week 1): Patient to don LSO with set up and verbal cues OT Short Term Goal 3 (Week 1): Patient to perform bathing with Min assist using AE PRN OT Short Term Goal 4 (Week 1): Patient to perform grooming standing at sink with close supervision  Skilled Therapeutic Interventions/Progress Updates:    Skilled OT intervention with focus on simulated shower transfers, discharge planning, and BLE/BUE therex for strengthening and general conditioning. Pt practiced stepping into/out of walk in shower with supervision. Discussed bathroom layout and pt's morning routine. NuStep level 5 for 10 mins 50 SPM. All transfers and ambulation with RW at supervision. Pt retuned to room.Wife agreed to take pictures of bathroom layout at home. Pt remained in room with all needs within reach. Wife present.   Therapy Documentation Precautions:  Precautions Precautions: Fall, Back Recall of Precautions/Restrictions: Intact Required Braces or Orthoses: Spinal Brace, Other Brace Spinal Brace: Lumbar corset Other Brace: AFO RLE Restrictions Weight Bearing Restrictions Per Provider Order: No Pain:  Pt reported back discomfort from earlier therapies and sitting in w/c; activity and repositioned.   Therapy/Group: Individual Therapy  [name] 09/15/2023, 2:56 PM

## 2023-09-15 NOTE — Consult Note (Signed)
 Neuropsychological Consultation Comprehensive Inpatient Rehab   Patient:   Aaron Richmond   DOB:   05/24/1945  MR Number:  [id number]  Location:  [facility] [address] [place] [address] Dept: [phone] Loc: [phone]           Date of Service:   09/15/2023  Start Time:   8 AM End Time:   9 AM  Provider/Observer:  Norleen Asa, Psy.D.       Clinical Neuropsychologist       Billing Code/Service: [phone]  Reason for Service:    Aaron Richmond is a 78 year old referred for neuropsychological consultation due to coping and adjustment issues after recent neurosurgical interventions for spinal cord injury and continuing to present with rather flat affect and concern around coping and adjustment issues with current admission to the comprehensive inpatient rehabilitation unit.  HISTORY OF PRESENTING ILLNESS:  Admitted on 09/03/2023 with back pain, right leg pain, and bilateral leg numbness and tingling. Workup revealed severe spinal cord compression at L1 with marked spondylosis. Subsequently underwent a T10-L4 fusion with lateral arthrodesis by Dr. Onetha. Post-operative course was complicated by tachycardia and hypotension, with initial failed cardioversion attempts. Later converted to atrial fibrillation with rapid ventricular response (A-fib RVR) with improved blood pressure; cardiology follow-up is ongoing. Therapy evaluations identified decreased functional mobility, leading to admission to the comprehensive inpatient rehabilitation program. Currently continent of bowel and bladder. Reports a history of right-sided drop foot following a hip fracture in January of this year. States a nerve conduction study was ordered by Dr. Onetha to evaluate this.  PAST MEDICAL HISTORY:  - Ascending aortic aneurysm  - Atrial fibrillation (on Xarelto )  - Idiopathic cardiomyopathy  - Medial aortopathy  - Mitral  valve insufficiency  - Metastatic prostate cancer  - Hypertension  - Hyperlipidemia  - Obstructive sleep apnea  - Chronic kidney disease, stage III  - Prediabetes  - Systolic congestive heart failure  - Lumbar laminectomy (2024)  - Right hip fracture with surgical repair (January 2025)  - Pressure sore (unspecified location), currently being treated for wound care.  BEHAVIORAL OBSERVATIONS AND MENTAL STATUS:  Oriented and cooperative. Affect was observed to be within normal range, though referral notes indicated flat affect. Reports finding the inpatient rehabilitation program demanding but understands its purpose. Expresses dissatisfaction with a previous rehabilitation stay at a skilled nursing facility following his hip fracture, feeling he did not make adequate progress. Reports frustration with a previous surgeon who he feels did not adequately investigate his ongoing pain complaints following a laminectomy in 2024, leading to a delay in diagnosis of his spinal stenosis. States he is now behind the issue and not pursuing it further. Uses hearing aids and reports they are helpful; plans to have them updated upon discharge.  Patient reports that as he sees improvement in his functioning that his mood has improved and that he is motivated to continue with ongoing rehabilitative efforts.  Medical History:   Past Medical History:  Diagnosis Date   Aneurysm of aortic root    Overview:  Last Assessment & Plan:  Planning to see Dr Army for evaluation of 5cm aneurysm.  - will check PFT in prep for possible procedure / SGY   Aortic atherosclerosis (HCC) 06/14/2022   Aortic regurgitation    Aortic valve insufficiency    Ascending aortic aneurysm (HCC) 09/11/2016   Atherosclerotic vascular disease 05/27/2022   Atrial fibrillation (HCC)  Atrial fibrillation with RVR (HCC) 07/05/2022   BMI 27.0-27.9,adult 06/14/2022   BPH (benign prostatic hyperplasia)    BRBPR (bright red  blood per rectum) 05/20/2023   Cardiomyopathy (HCC) 09/11/2016   CHF (congestive heart failure) (HCC)    CKD (chronic kidney disease) stage 2, GFR 60-89 ml/min 11/23/2021   Closed fracture of right hip (HCC) 03/10/2023   Coronary artery calcification seen on CT scan 10/13/2017   Dysrhythmia    Essential hypertension 06/14/2022   Gastrointestinal hemorrhage 05/20/2023   Heart murmur    High risk medication use 07/31/2022   History of cardiomyopathy 11/09/2021   History of pulmonary embolism 09/24/2012   Overview:  Last Assessment & Plan:  Hx PE x 2 per notes, ? 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  - needs auto-titration study by [employer] or local company, data to RB to adjust his home device.    OSA (obstructive sleep apnea) 09/13/2022   Prediabetes    Primary osteoarthritis involving multiple joints 06/14/2022   Prostate cancer metastatic to bone (HCC) 06/14/2022   Pulmonary embolism (HCC)    Rhinitis    Sacral wound 04/11/2023   Seasonal allergies 06/14/2022   Spondylolisthesis of lumbar region 12/12/2021   Stage 3a chronic kidney disease (HCC) 06/14/2022   Unstageable pressure ulcer of sacral region (HCC) 03/10/2023         Patient Active Problem List   Diagnosis Date Noted   Coping style affecting medical condition 09/15/2023   Acute incomplete paraplegia (HCC) 09/11/2023   Shock (HCC) 09/05/2023   Lumbar  burst fracture (HCC) 09/03/2023   Chronic right-sided low back pain with right-sided sciatica 07/14/2023   BRBPR (bright red blood per rectum) 05/20/2023   Gastrointestinal hemorrhage 05/20/2023   Chronic low back pain without sciatica 05/20/2023   Sacral wound 04/11/2023   Closed fracture of right hip (HCC) 03/10/2023   Unstageable pressure ulcer of sacral region (HCC) 03/10/2023   OSA (obstructive sleep apnea) 09/13/2022   Prediabetes 09/13/2022   Atrial fibrillation with RVR (HCC) 07/05/2022   Prostate cancer metastatic to bone (HCC) 06/14/2022   Aortic atherosclerosis (HCC) 06/14/2022   Essential hypertension 06/14/2022   Hypercholesterolemia 06/14/2022   Mitral valve insufficiency 06/14/2022   Primary osteoarthritis involving multiple joints 06/14/2022   Seasonal allergies 06/14/2022   BMI 27.0-27.9,adult 06/14/2022   Atherosclerotic vascular disease 05/27/2022   Lumbar radiculopathy 01/24/2022   Spondylolisthesis of lumbar region 12/12/2021   CKD (chronic kidney disease) stage 2, GFR 60-89 ml/min 11/23/2021   History of cardiomyopathy 11/09/2021   Aortic regurgitation 06/11/2021   BPH (benign prostatic hyperplasia) 06/11/2021   Pulmonary embolism (HCC) 06/11/2021   Rhinitis 06/11/2021   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   Malignant neoplasm of prostate (HCC) 10/04/2020   Malignant neoplasm of prostate metastatic to bone (HCC) 10/04/2020   Obesity (BMI 30-39.9) 10/16/2017   Hyperlipidemia 10/14/2017   Preoperative clearance 10/13/2017   Coronary artery calcification seen  on CT scan 10/13/2017   Cardiomyopathy (HCC) 09/11/2016   Ascending aortic aneurysm (HCC) 09/11/2016   Hypertension 10/06/2014   Atrial fibrillation (HCC) 09/24/2012   Aneurysm of aortic root (HCC)    Aortic valve insufficiency     Family Med/Psych History:  Family History  Problem Relation Age of Onset   Stroke Mother    Alzheimer's disease Mother    Heart attack  Mother    Stroke Father    Diabetes Mellitus II Father    Impression/DX:   Aaron Richmond is a 78 year old male coping with significant medical and functional changes following multilevel spinal fusion. His mood appears appropriate to the situation, with expressed motivation for rehabilitation. Frustration regarding previous medical care is noted but does not appear to be impeding current engagement in treatment.  - Psychoeducation provided regarding the role of neuropsychology in understanding the relationship between the nervous system and overall functioning. - Discussed the brain health benefits of consistent hearing aid use. - Provided rationale for intensive inpatient rehabilitation to maximize functional recovery and prevent decline post-discharge. - Discussed nerve healing and recovery timelines, specifically related to his drop foot.  - Patient reported plans to resume acupuncture for nerve damage post-discharge. Counseled on the safety of acupuncture with a licensed provider and the lack of strong evidence for its efficacy in treating drop foot, but acknowledged the low risk and potential for subjective benefit. Advised to avoid acupuncture near his current pressure sore. - Acknowledged the patient's report of a previous chiropractor who appropriately declined to perform manipulations given his spinal surgical history. This demonstrates good clinical judgment on the part of that provider. - Encouraged continued active participation in all therapies. - Will continue to follow as needed during his inpatient stay.         Electronically Signed   _______________________ Norleen Asa, Psy.D. Clinical Neuropsychologist

## 2023-09-15 NOTE — Progress Notes (Signed)
 Occupational Therapy Session Note  Patient Details  Name: Aaron Richmond MRN: [id number] Date of Birth: June 13, 1945  Today's Date: 09/15/2023 OT Individual Time: 9069-8954 OT Individual Time Calculation (min): 75 min    Short Term Goals: Week 1:  OT Short Term Goal 1 (Week 1): Min assist with AE for LE dressing OT Short Term Goal 2 (Week 1): Patient to don LSO with set up and verbal cues OT Short Term Goal 3 (Week 1): Patient to perform bathing with Min assist using AE PRN OT Short Term Goal 4 (Week 1): Patient to perform grooming standing at sink with close supervision  Skilled Therapeutic Interventions/Progress Updates:    Pt resting in bed upon arrival and agreeable to getting OOB. Skilled OT intervention with focus on BADLs, bed mobility, functional amb with RW, furniture transfers, and discharge planning to increase independene with BADLs. Supine>sit EOB with supervision. Pt completed bathing/dressing with sit<>stand from w/c at sink. Standing balance with close supervision. Pt dons shirt and LSO with supervision. Min A for donning pants. Max A to [employer] hose, AFO, and shows. Furniture transfer from recliner with mod A (lower surface). All amb with RW at close supervision. Sit>supine in bed with supervision. Pt remained in bed with all needs within reach. Bed alarm activated.   Therapy Documentation Precautions:  Precautions Precautions: Fall, Back Recall of Precautions/Restrictions: Intact Required Braces or Orthoses: Spinal Brace, Other Brace Spinal Brace: Lumbar corset Other Brace: AFO RLE Restrictions Weight Bearing Restrictions Per Provider Order: No   Pain: Pt c/o back discomfort (unrated); activity and OOB activities with relief noted Therapy/Group: Individual Therapy  [name] 09/15/2023, 12:06 PM

## 2023-09-15 NOTE — Progress Notes (Signed)
 Met with patient to review current situation, team conference and plan of care. Reviewed medications bowel and bladder, picc line care incision care , safety. Continue to follow along to provide educational needs to facilitate preparation for discharge.

## 2023-09-15 NOTE — Progress Notes (Signed)
 PROGRESS NOTE   Subjective/Complaints:  Pt reports LBM x2 yesterday Has soreness, but not really pain.   No other concerns.   ROS:   Pt denies SOB, abd pain, CP, N/V/C/D, and vision changes   Per HPI   Objective:   No results found.  Recent Labs    09/15/23 0521  WBC 11.0*  HGB 9.1*  HCT 27.6*  PLT 451*    Recent Labs    09/15/23 0521  NA 135  K 4.4  CL 97*  CO2 26  GLUCOSE 103*  BUN 26*  CREATININE 1.67*  CALCIUM  9.1     Intake/Output Summary (Last 24 hours) at 09/15/2023 1334 Last data filed at 09/15/2023 1200 Gross per 24 hour  Intake 484 ml  Output 1010 ml  Net -526 ml        Physical Exam: Vital Signs Blood pressure (!) 111/51, pulse 82, temperature 97.7 F (36.5 C), temperature source Oral, resp. rate 18, height 5' 11 (1.803 m), weight 76.1 kg, SpO2 94%.     General: awake, alert, appropriate, sitting up slightly in bed- breakfast at bedside- attempted to pull pt up some/and put tray in front of him; NAD HENT: conjugate gaze; oropharynx dry CV: regular rate and controlled; no JVD Pulmonary: CTA B/L; no W/R/R- good air movement GI: soft, NT, ND, (+)BS Psychiatric: appropriate- a little flat Neurological: Ox3 GU- medium-dark urine  150-200cc/small amount in urinal at bedside Skin: Bruising on b/l UE, Incision with dressing CDI, foam dressing both feet  PRIOR EXAMS: Neurological: very groggy this AM- took 3-4 minutes to be able to answer questions appropriately- (last pain meds 521am)  Neuro:     Mental Status: AAOx4, memory grossly intact Speech/Languate: fluent, follows commands CRANIAL NERVES:2-12  intact     MOTOR: RUE: 4+/5 Deltoid,4+/5 Biceps, 4+/5 Triceps,4+/5 Grip LUE: 4+/5 Deltoid, 4+/5 Biceps, 4+/5 Triceps, 4+/5 Grip RLE: HF 4-/5, KE 4+/5, ADF 0/5, APF 4/5 LLE: HF 4-/5, KE 4+/5, ADF 4/5, APF 4/5     REFLEXES: NO ankle clonus   SENSORY: Normal to touch all 4  extremities   Coordination: No UE ataxia/dysmetria noted   Picc R arm, IV L arm   Assessment/Plan: 1. Functional deficits which require 3+ hours per day of interdisciplinary therapy in a comprehensive inpatient rehab setting. Physiatrist is providing close team supervision and 24 hour management of active medical problems listed below. Physiatrist and rehab team continue to assess barriers to discharge/monitor patient progress toward functional and medical goals  Care Tool:  Bathing    Body parts bathed by patient: Right arm, Left arm, Chest, Abdomen, Front perineal area, Buttocks, Right upper leg, Left upper leg   Body parts bathed by helper: Right lower leg, Left lower leg     Bathing assist Assist Level: Minimal Assistance - Patient > 75%     Upper Body Dressing/Undressing Upper body dressing   What is the patient wearing?: Pull over shirt, Orthosis Orthosis activity level: Performed by patient  Upper body assist Assist Level: Supervision/Verbal cueing    Lower Body Dressing/Undressing Lower body dressing      What is the patient wearing?: Pants     Lower body assist  Assist for lower body dressing: Minimal Assistance - Patient > 75%     Toileting Toileting    Toileting assist Assist for toileting: Minimal Assistance - Patient > 75%     Transfers Chair/bed transfer  Transfers assist     Chair/bed transfer assist level: Minimal Assistance - Patient > 75%     Locomotion Ambulation   Ambulation assist      Assist level: Minimal Assistance - Patient > 75% Assistive device: Walker-rolling Max distance: 130   Walk 10 feet activity   Assist     Assist level: Minimal Assistance - Patient > 75% Assistive device: Walker-rolling   Walk 50 feet activity   Assist    Assist level: Minimal Assistance - Patient > 75% Assistive device: Walker-rolling    Walk 150 feet activity   Assist Walk 150 feet activity did not occur: Safety/medical  concerns         Walk 10 feet on uneven surface  activity   Assist Walk 10 feet on uneven surfaces activity did not occur: Safety/medical concerns         Wheelchair     Assist Is the patient using a wheelchair?: Yes Type of Wheelchair: Manual    Wheelchair assist level: Supervision/Verbal cueing Max wheelchair distance: 150    Wheelchair 50 feet with 2 turns activity    Assist        Assist Level: Contact Guard/Touching assist   Wheelchair 150 feet activity     Assist      Assist Level: Contact Guard/Touching assist   Blood pressure (!) 111/51, pulse 82, temperature 97.7 F (36.5 C), temperature source Oral, resp. rate 18, height 5' 11 (1.803 m), weight 76.1 kg, SpO2 94%.  Medical Problem List and Plan: 1. Functional deficits secondary to L1 burst fracture- with traumatic incomplete ASIA D paraplegia with cord compression status post T10-L4 fusion with transpedicular decompression L1.  Lumbar corset when out of bed applied in sitting position             -patient may shower             -ELOS/Goals: 10-12 days, PT/OT mina/sup             Con't CIR PT and OT  D/w nursing at length about him needing to push PO fluids D/w nursing- pt wants purewick at night- unfortunately, since we are training pt to not have one at home, I'm not sure it's appropriate right now- if he cannot stay continent at night, can re-eval Main limitation- LE weakness- can do Estim for R foot drop- has been >2 years since prostate CA 2.  Antithrombotics: -DVT/anticoagulation:  Pharmaceutical: Heparin  transitioning back to Xarelto  9/4- Xarelto  20mg  daily to start today             -antiplatelet therapy: N/A 3. Pain Management: Tramadol /hydrocodone  as needed, Robaxin  500 mg 4 times daily 9/5- Pt reports pain bothering him this AM- but hasn't had pain meds- asked nursing to get-  9/8- just sore- con't regimen 4. Mood/Behavior/Sleep: Provide emotional support              -antipsychotic agents: N/A 5. Neuropsych/cognition: This patient is capable of making decisions on his own behalf. 6. Skin/Wound Care: Routine skin checks  9/8- bruising on feet- will monitor 7. Fluids/Electrolytes/Nutrition: Routine in and outs with follow-up chemistries. Continue K supplementation, vitamins, and protonix  40mg  daily 8.  Acute blood loss anemia.  Follow-up CBC. Hgb stable 9.1 on 9/4 9.  Shock/narrow  complex tachycardia with hypotension.  Resolved.  Follow-up cardiology services 10.  Chronic systolic congestive heart failure/nonischemic CMP.  Demadex  20 mg daily, Aldactone  25 mg daily, Farxiga  10 mg daily.  Monitor for any fluid overload. Daily weights  9/4- Last weight yesterday 75 kg- will ask nursing to get weight daily  9/5- Weight 75.4 kg- stable  -09/14/23 wt stable today. Monitor.   9/8- Weight over all stable  Filed Weights   09/12/23 0500 09/14/23 0448 09/15/23 0622  Weight: 75.4 kg 76 kg 76.1 kg    11.  CAD.  Toprol -XL 12.5 mg BID.  Monitor with increased mobility 12.  Atrial fibrillation.  Transitioning from IV heparin  back to Xarelto .  Continue amiodarone  200 mg twice daily for now.  Continue metoprolol   9/8- rate controlled 13.  CKD stage IIIA.  Follow-up chemistries.  Avoid nephrotoxic medications.  9/4- pt's Cr 1.30- is still in baseline range- con't to monitor  9/8- Cr 1.67 up from 1.30 and prior 1.01- and BUN up to 26- from 13- with CHF, don't want to hold meds or give IVFs- if doesn't improve with pushing fluids today- will hold his diuretics tomorrow 14.  Hyperlipidemia.  Crestor  10mg  daily 15.  History of prostate cancer.  Flomax  0.4 mg daily.- per PA, was 2022- pt said doesn't remember when but more than 3 years ago. 16.  Constipation.  MiraLAX  twice daily             - LBM 9/2, continent 9/4- wants more miralax - has BID and prn- will make sure pt continues to get meds.   9/5- 1 BM today and one last evening- con't regimen  -09/14/23 LBM this morning, not  documented, cont regimen  9/8- LBM x2 yesterday 17.R foot drop-chronic, has AFO 9/4- from Hip surgery in January- went over with pt just the hip fx could have caused the R foot drop- we can do E stim- since has been since 2017 since prostate CA 18. Leukocytosis  9/8- WBC 11k- don't see reason- d/w nursing- incision looks stable- no URI/UTI Sx's- will recheck in AM   I spent a total of  52  minutes on total care today- >50% coordination of care- due to  Called nursing and spoke at length- also d/w pharmacy and PA- also d/w pt- and also called nursing again to discuss fluid status and chance for infection  LOS: 5 days A FACE TO FACE EVALUATION WAS PERFORMED  Palma Buster 09/15/2023, 1:34 PM

## 2023-09-15 NOTE — Progress Notes (Signed)
 Physical Therapy Weekly Progress Note  Patient Details  Name: Aaron Richmond MRN: [id number] Date of Birth: 09-Jun-1945  Beginning of progress report period: September 11, 2023 End of progress report period: September 15, 2023  Patient has met 3 of 3 short term goals.  First thing in AM, pt requires significantly more assist for transfers, but improves with repetition and time. Pt limited by decreased B step height; wears R AFO due to no R ant tib activation. Pt also limited by fear of falling, which he mentions 1-2x per session. Pt stated he was exhausted but gait quality did not change.   Patient continues to demonstrate the following deficits muscle weakness, decreased cardiorespiratoy endurance, impaired timing and sequencing and decreased coordination, and decreased standing balance and decreased balance strategies and therefore will continue to benefit from skilled PT intervention to increase functional independence with mobility.  Patient progressing toward long term goals..  Continue plan of care.  PT Short Term Goals Week 1:  PT Short Term Goal 1 (Week 1): Pt will complete 3 consecutive sit to stand transfers w/ CGA PT Short Term Goal 2 (Week 1): Pt will perform 4 stairs w/ one handrail and side-stepping w/ minA PT Short Term Goal 3 (Week 1): Pt will demonstrate proper technique for sit to stand, using BUE to push off WC and not RW, 3 consecutive transfers in a row.  Therapy Documentation Precautions:  Precautions Precautions: Fall, Back Recall of Precautions/Restrictions: Intact Required Braces or Orthoses: Spinal Brace, Other Brace Spinal Brace: Lumbar corset Other Brace: AFO RLE Restrictions Weight Bearing Restrictions Per Provider Order: No  [name] 09/15/2023, 8:13 AM

## 2023-09-16 LAB — BASIC METABOLIC PANEL WITH GFR
Anion gap: 12 (ref 5–15)
BUN: 26 mg/dL — ABNORMAL HIGH (ref 8–23)
CO2: 24 mmol/L (ref 22–32)
Calcium: 9 mg/dL (ref 8.9–10.3)
Chloride: 99 mmol/L (ref 98–111)
Creatinine, Ser: 1.67 mg/dL — ABNORMAL HIGH (ref 0.61–1.24)
GFR, Estimated: 42 mL/min — ABNORMAL LOW
Glucose, Bld: 110 mg/dL — ABNORMAL HIGH (ref 70–99)
Potassium: 4.4 mmol/L (ref 3.5–5.1)
Sodium: 135 mmol/L (ref 135–145)

## 2023-09-16 LAB — CBC WITH DIFFERENTIAL/PLATELET
Abs Immature Granulocytes: 0.2 K/uL — ABNORMAL HIGH (ref 0.00–0.07)
Basophils Absolute: 0.1 K/uL (ref 0.0–0.1)
Basophils Relative: 1 %
Eosinophils Absolute: 0 K/uL (ref 0.0–0.5)
Eosinophils Relative: 0 %
HCT: 27.6 % — ABNORMAL LOW (ref 39.0–52.0)
Hemoglobin: 9.1 g/dL — ABNORMAL LOW (ref 13.0–17.0)
Immature Granulocytes: 2 %
Lymphocytes Relative: 13 %
Lymphs Abs: 1.3 K/uL (ref 0.7–4.0)
MCH: 30.8 pg (ref 26.0–34.0)
MCHC: 33 g/dL (ref 30.0–36.0)
MCV: 93.6 fL (ref 80.0–100.0)
Monocytes Absolute: 1.1 K/uL — ABNORMAL HIGH (ref 0.1–1.0)
Monocytes Relative: 11 %
Neutro Abs: 7.1 K/uL (ref 1.7–7.7)
Neutrophils Relative %: 73 %
Platelets: 490 K/uL — ABNORMAL HIGH (ref 150–400)
RBC: 2.95 MIL/uL — ABNORMAL LOW (ref 4.22–5.81)
RDW: 15.1 % (ref 11.5–15.5)
WBC: 9.6 K/uL (ref 4.0–10.5)
nRBC: 0 % (ref 0.0–0.2)

## 2023-09-16 MED ORDER — POLYETHYLENE GLYCOL 3350 17 G PO PACK: 17.0000 g | PACK | Freq: Every day | ORAL | Status: DC

## 2023-09-16 MED ORDER — ACETAMINOPHEN 325 MG PO TABS: 650.0000 mg | ORAL_TABLET | ORAL | Status: DC | PRN

## 2023-09-16 MED ORDER — TORSEMIDE 20 MG PO TABS
10.0000 mg | ORAL_TABLET | Freq: Every day | ORAL | Status: DC
  Administered 2023-09-17 – 2023-09-24 (×8): 10 mg via ORAL
  Filled 2023-09-16 (×8): qty 1

## 2023-09-16 MED ORDER — SODIUM CHLORIDE 0.9 % IV SOLN
INTRAVENOUS | Status: AC
Start: 2023-09-16 — End: 2023-09-16

## 2023-09-16 NOTE — Patient Care Conference (Signed)
 Inpatient RehabilitationTeam Conference and Plan of Care Update Date: 09/16/2023   Time: 1126 am    Patient Name: Aaron Richmond      Medical Record Number: [id number]  Date of Birth: 1945/12/12 Sex: Male         Room/Bed: 4W11C/4W11C-01 Payor Info: Payor: [employer] / Plan: [employer] PPO / Product Type: *No Product type* /    Admit Date/Time:  09/10/2023  4:29 PM  Primary Diagnosis:  Acute incomplete paraplegia [facility])  Hospital Problems: Principal Problem:   Acute incomplete paraplegia [facility]) Active Problems:   Lumbar burst fracture (HCC)   Coping style affecting medical condition    Expected Discharge Date: Expected Discharge Date: 09/24/23  Team Members Present: Physician leading conference: Dr. Megan Lovorn Social Worker Present: Graeme Jude, LCSW Nurse Present: Eulalio Falls, RN PT Present: Schuyler Batter, PT OT Present: Delon Sharps, OT;Charlena Cha, COTA Other (Discipline and Name): Cara RN, AD     Current Status/Progress Goal Weekly Team Focus  Bowel/Bladder   Continent of bowel/bladder. LBM 09/15/23   Remain Continent of bowel/bladder with Mod I while in rehab   Assess bowel/bladder function q shift and as needed    Swallow/Nutrition/ Hydration               ADL's   bathing-min A; LB dressing-min A; donning footwear-mod A; transfers-CGA; toileting-min A   supervision overall; mod I toilet transfers and toileting   BADLs, transfers, education, activity tolerance    Mobility   SBA bed mob, mod-maxA sit to stand from bed, as little as CGA from WC, CGA to minA for gait w/ RW   supervision for transfers & gait  B foot clearance in gait, balance, stairs    Communication                Safety/Cognition/ Behavioral Observations               Pain   Complains of pain, Norco 2 tabs Q4 hrs on board.   To be pain free while in Rehab   Assess and treat pain Q shift and as needed    Skin   surgical incision to the back and  redness to his butt   Skin to be free of breakdown/infection with Mod I  Assess skin Q shift and as needed      Discharge Planning:  Pt will d/c to home with his wife who will provide 24/7 care. SW will confirm there are no barriers to discharge.    Team Discussion: Patient admitted post L1 burst fracture with cord compression.  Patient limited by right foot drop, poor endurance, self limiting behaviors.   Patient on target to meet rehab goals: Currently, patient needs minimal assistance with ADLs . Patient needs CGA qith transfers. Patient needs CGA to min assistance with ambulation using a rolling walker. Overall goals at discharge are set for supervision-mod I assistance.   *See Care Plan and progress notes for long and short-term goals.   Revisions to Treatment Plan:  AFO Teds   Teaching Needs: Safety, medications, lumbar corset application, transfers, toileting, etc   Current Barriers to Discharge: Decreased caregiver support, Home enviroment access/layout, and Wound care  Possible Resolutions to Barriers: Family Education     Medical Summary Current Status: Incision looks OK- WBC back to 9.7; grumpy- uses urinal- conitnent- fissure on bottom; soreness- Increased Cr to 1.67 and BUN 25-  Barriers to Discharge: Self-care education;Medical stability;Behavior/Mood;Weight bearing restrictions;Volume Overload;Renal Insufficiency/Failure  Barriers to Discharge  Comments: limited by Renal function- endurance; fatigues easily; a little grumpy Possible Resolutions to [employer] Focus: decreased torsemide - he didn't take every day at home per pt-  pushing fluids- giving IVF 500cc x1- d/c- 9/17   Continued Need for Acute Rehabilitation Level of Care: The patient requires daily medical management by a physician with specialized training in physical medicine and rehabilitation for the following reasons: Direction of a multidisciplinary physical rehabilitation program to maximize  functional independence : Yes Medical management of patient stability for increased activity during participation in an intensive rehabilitation regime.: Yes Analysis of laboratory values and/or radiology reports with any subsequent need for medication adjustment and/or medical intervention. : Yes   I attest that I was present, lead the team conference, and concur with the assessment and plan of the team.   Merelin Human Gayo 09/16/2023, 1126 am

## 2023-09-16 NOTE — Progress Notes (Signed)
 Occupational Therapy Session Note  Patient Details  Name: Aaron Richmond MRN: [id number] Date of Birth: February 18, 1945  Today's Date: 09/16/2023 OT Individual Time: 9069-8972 OT Individual Time Calculation (min): 57 min    Short Term Goals: Week 1:  OT Short Term Goal 1 (Week 1): Min assist with AE for LE dressing OT Short Term Goal 2 (Week 1): Patient to don LSO with set up and verbal cues OT Short Term Goal 3 (Week 1): Patient to perform bathing with Min assist using AE PRN OT Short Term Goal 4 (Week 1): Patient to perform grooming standing at sink with close supervision  Skilled Therapeutic Interventions/Progress Updates:    Skilled OT intervention with focus on bed moblity, sit<>stand, standing balance, bathing at shower level, dressing with sit<>stand at sink, education, and ongoing discharge planning to increase independence with bADLs. Supine>sit EOB with supervision. Amb with RW to bathroom and transfer to shower with CGA. Bathing with min A for BLE but pt able to stand in shower to wash buttocks using grab bars. Dressing with assistance for donning compression hose, AFO, and shoes. Rest breaks as appropriate during session. Reviewed pictures of shower and bathroom setup at home. Recommended grab bars in shower. Pt returned to bed at end of session. All needs within reach. Bed alarm activated.   Therapy Documentation Precautions:  Precautions Precautions: Fall, Back Recall of Precautions/Restrictions: Intact Required Braces or Orthoses: Spinal Brace, Other Brace Spinal Brace: Lumbar corset Other Brace: AFO RLE Restrictions Weight Bearing Restrictions Per Provider Order: No   Pain: Pt reports his back is getting tired; return to bed at end of session and appropriate rest breaks during therapy  Therapy/Group: Individual Therapy  [name] 09/16/2023, 10:29 AM

## 2023-09-16 NOTE — Progress Notes (Signed)
 Occupational Therapy Session Note  Patient Details  Name: Aaron Richmond MRN: [id number] Date of Birth: 1945-08-30  Today's Date: 09/16/2023 OT Individual Time: 1330-1425 OT Individual Time Calculation (min): 55 min    Short Term Goals: Week 1:  OT Short Term Goal 1 (Week 1): Min assist with AE for LE dressing OT Short Term Goal 2 (Week 1): Patient to don LSO with set up and verbal cues OT Short Term Goal 3 (Week 1): Patient to perform bathing with Min assist using AE PRN OT Short Term Goal 4 (Week 1): Patient to perform grooming standing at sink with close supervision  Skilled Therapeutic Interventions/Progress Updates:    Skilled OT intervention with focus on bed mobility, sit<>stand, standing balance, functional amb in community setting, functional amb with RW in room, education, and safety awareness to increase independence with BADLs. All sit<>stand with CGA. Amb in courtyard x 4 with CGA. Standing balance with supervision. Pt pleased with progress he is making. Pt returned to bed at end of session. Sit>supine with supervision. All needs within reach and bed alarm activated.   Therapy Documentation Precautions:  Precautions Precautions: Fall, Back Recall of Precautions/Restrictions: Intact Required Braces or Orthoses: Spinal Brace, Other Brace Spinal Brace: Lumbar corset Other Brace: AFO RLE Restrictions Weight Bearing Restrictions Per Provider Order: No Pain: Pt reports 7/10 back pain with activity; rest breaks as appropriate and return to bed at end of session    Therapy/Group: Individual Therapy  [name] 09/16/2023, 2:30 PM

## 2023-09-16 NOTE — Progress Notes (Signed)
 Physical Therapy Session Note  Patient Details  Name: Aaron Richmond MRN: [id number] Date of Birth: Apr 26, 1945  Today's Date: 09/16/2023 PT Individual Time: 800-900, 1137-1207 PT Individual Time Calculation (min): 60 min, 30 min   Short Term Goals: Week 1:  PT Short Term Goal 1 (Week 1): Pt will complete 3 consecutive sit to stand transfers w/ CGA PT Short Term Goal 2 (Week 1): Pt will perform 4 stairs w/ one handrail and side-stepping w/ minA PT Short Term Goal 3 (Week 1): Pt will demonstrate proper technique for sit to stand, using BUE to push off WC and not RW, 3 consecutive transfers in a row.  Skilled Therapeutic Interventions/Progress Updates:   1st session:  Pt received supine in bed, agreeable to therapy. No c/o pain until very end of session. Addressed below.  Pt ambulated to day room w/ SBA and RW, to improve cardiovascular endurance and decrease fall risk. Pt demonstrated normal BOS, decreased step length B, decreased B foot clearance. Pt performed alternating step taps onto 6 step w/ RW and CGA progressing to SBA. Pt cued to step quieter to improve fine motor control, w/ good carryover. Pt ambulated ~24ft back to room w/ RW and SBA, cued to stand up straighter and demonstrated improved posture (decreased forward trunk lean).  Pt returned to bed at end of session, requesting to rest. Pt given minA for lifting assist of LE in sit to supine bed mob. Pt reported mild abdominal pain w/ transfer, log roll technique to be addressed at next session. Pt given call bell and bed alarm turned on.   2nd session:   Second session focused on log roll technique for supine<>sit, repeated 3x for improved carryover. Pt verbalized understanding and stated several times this is how I normally do it at home. Blocked practice of sit to stand transfers practiced from EOB. Pt required increased time, minA and RW for safety. Sit to stand transfer takes ~7-8 seconds, fear of falling evident in pt  affinity for RW. Pt encouraged to push up from EOB w/ BUE, performed w/ poor carryover/transition to grabbing RW. Pt ambulated to/from day room w/ RW CGA to SBA w/ verbal cues to stay close to RW and stand up straight. Separate discussion w/ pt wife held to learn more about pt baseline before onset of low back pain. Baseline is w/ cane in R hand, pt shuffles because he's getting old. Pt returned to bed, performing log roll w/ reduced pain. Pt given call bell and all needs within reach. Bed alarm on. Wife present in room.   Therapy Documentation Precautions:  Precautions Precautions: Fall, Back Recall of Precautions/Restrictions: Intact Required Braces or Orthoses: Spinal Brace, Other Brace Spinal Brace: Lumbar corset Other Brace: AFO RLE Restrictions Weight Bearing Restrictions Per Provider Order: No  Therapy/Group: Individual Therapy  [name] 09/16/2023, 12:39 PM

## 2023-09-16 NOTE — Progress Notes (Signed)
 PROGRESS NOTE   Subjective/Complaints:  Pt reports still just sore No Sx's of URI, UTI and feels good except soreness.  Tried to drink more, but said didn't hear needed to until last night.  Ate 100% tray.   On diuretics and mentioned that intermittently/regularly at home skips his diuretics- likely why is dry  LBM overnight ROS:   Pt denies SOB, abd pain, CP, N/V/C/D, and vision changes   Per HPI   Objective:   No results found.  Recent Labs    09/15/23 0521 09/16/23 0454  WBC 11.0* 9.6  HGB 9.1* 9.1*  HCT 27.6* 27.6*  PLT 451* 490*    Recent Labs    09/15/23 0521 09/16/23 0454  NA 135 135  K 4.4 4.4  CL 97* 99  CO2 26 24  GLUCOSE 103* 110*  BUN 26* 26*  CREATININE 1.67* 1.67*  CALCIUM  9.1 9.0     Intake/Output Summary (Last 24 hours) at 09/16/2023 1030 Last data filed at 09/16/2023 0921 Gross per 24 hour  Intake 477 ml  Output 860 ml  Net -383 ml        Physical Exam: Vital Signs Blood pressure (!) 113/45, pulse 67, temperature 98.7 F (37.1 C), temperature source Oral, resp. rate 17, height 5' 11 (1.803 m), weight 76.3 kg, SpO2 96%.      General: awake, alert, appropriate, sitting up finished 100% tray;  NAD HENT: conjugate gaze; oropharynx dry CV: regular rate- ; no JVD Pulmonary: CTA B/L; no W/R/R- good air movement GI: soft, NT, ND, (+)BS- normoactive Psychiatric: appropriate Neurological: Ox3 Extremities; no LE edema- was wearing Prevalons- took them off since up  Skin: Bruising on b/l UE, Incision with dressing CDI, foam dressing both feet   PRIOR EXAMS: Neurological: very groggy this AM- took 3-4 minutes to be able to answer questions appropriately- (last pain meds 521am)  Neuro:     Mental Status: AAOx4, memory grossly intact Speech/Languate: fluent, follows commands CRANIAL NERVES:2-12  intact     MOTOR: RUE: 4+/5 Deltoid,4+/5 Biceps, 4+/5 Triceps,4+/5  Grip LUE: 4+/5 Deltoid, 4+/5 Biceps, 4+/5 Triceps, 4+/5 Grip RLE: HF 4-/5, KE 4+/5, ADF 0/5, APF 4/5 LLE: HF 4-/5, KE 4+/5, ADF 4/5, APF 4/5     REFLEXES: NO ankle clonus   SENSORY: Normal to touch all 4 extremities   Coordination: No UE ataxia/dysmetria noted   Picc R arm, IV L arm   Assessment/Plan: 1. Functional deficits which require 3+ hours per day of interdisciplinary therapy in a comprehensive inpatient rehab setting. Physiatrist is providing close team supervision and 24 hour management of active medical problems listed below. Physiatrist and rehab team continue to assess barriers to discharge/monitor patient progress toward functional and medical goals  Care Tool:  Bathing    Body parts bathed by patient: Right arm, Left arm, Chest, Abdomen, Front perineal area, Buttocks, Right upper leg, Left upper leg   Body parts bathed by helper: Right lower leg, Left lower leg     Bathing assist Assist Level: Minimal Assistance - Patient > 75%     Upper Body Dressing/Undressing Upper body dressing   What is the patient wearing?: Pull over shirt, Orthosis Orthosis activity  level: Performed by patient  Upper body assist Assist Level: Supervision/Verbal cueing    Lower Body Dressing/Undressing Lower body dressing      What is the patient wearing?: Underwear/pull up, Pants     Lower body assist Assist for lower body dressing: Minimal Assistance - Patient > 75%     Toileting Toileting    Toileting assist Assist for toileting: Minimal Assistance - Patient > 75%     Transfers Chair/bed transfer  Transfers assist     Chair/bed transfer assist level: Contact Guard/Touching assist     Locomotion Ambulation   Ambulation assist      Assist level: Contact Guard/Touching assist Assistive device: Walker-rolling Max distance: 130   Walk 10 feet activity   Assist     Assist level: Contact Guard/Touching assist Assistive device: Walker-rolling   Walk  50 feet activity   Assist    Assist level: Contact Guard/Touching assist Assistive device: Walker-rolling    Walk 150 feet activity   Assist Walk 150 feet activity did not occur: Safety/medical concerns  Assist level: Contact Guard/Touching assist Assistive device: Walker-rolling    Walk 10 feet on uneven surface  activity   Assist Walk 10 feet on uneven surfaces activity did not occur: Safety/medical concerns         Wheelchair     Assist Is the patient using a wheelchair?: Yes Type of Wheelchair: Manual    Wheelchair assist level: Supervision/Verbal cueing Max wheelchair distance: 150    Wheelchair 50 feet with 2 turns activity    Assist        Assist Level: Contact Guard/Touching assist   Wheelchair 150 feet activity     Assist      Assist Level: Contact Guard/Touching assist   Blood pressure (!) 113/45, pulse 67, temperature 98.7 F (37.1 C), temperature source Oral, resp. rate 17, height 5' 11 (1.803 m), weight 76.3 kg, SpO2 96%.  Medical Problem List and Plan: 1. Functional deficits secondary to L1 burst fracture- with traumatic incomplete ASIA D paraplegia with cord compression status post T10-L4 fusion with transpedicular decompression L1.  Lumbar corset when out of bed applied in sitting position             -patient may shower             -ELOS/Goals: 10-12 days, PT/OT mina/sup             Con't Cir PT and OT  Team conference today to f/u on progress  Main limitation- LE weakness- can do Estim for R foot drop- has been >2 years since prostate CA 2.  Antithrombotics: -DVT/anticoagulation:  Pharmaceutical: Heparin  transitioning back to Xarelto  9/4- Xarelto  20mg  daily to start today             -antiplatelet therapy: N/A 3. Pain Management: Tramadol /hydrocodone  as needed, Robaxin  500 mg 4 times daily 9/5- Pt reports pain bothering him this AM- but hasn't had pain meds- asked nursing to get-  9/8-9/9- just sore- con't regimen 4.  Mood/Behavior/Sleep: Provide emotional support             -antipsychotic agents: N/A 5. Neuropsych/cognition: This patient is capable of making decisions on his own behalf. 6. Skin/Wound Care: Routine skin checks  9/8- bruising on feet- will monitor 7. Fluids/Electrolytes/Nutrition: Routine in and outs with follow-up chemistries. Continue K supplementation, vitamins, and protonix  40mg  daily 8.  Acute blood loss anemia.  Follow-up CBC. Hgb stable 9.1 on 9/4 9.  Shock/narrow complex tachycardia with hypotension.  Resolved.  Follow-up cardiology services 10.  Chronic systolic congestive heart failure/nonischemic CMP.  Demadex  20 mg daily, Aldactone  25 mg daily, Farxiga  10 mg daily.  Monitor for any fluid overload. Daily weights  9/4- Last weight yesterday 75 kg- will ask nursing to get weight daily  9/5- Weight 75.4 kg- stable  -09/14/23 wt stable today. Monitor.   9/8- Weight over all stable   9/9- weight stable but Cr 1.67- will decrease Torsemide  to 10 mg daily for now-  Filed Weights   09/14/23 0448 09/15/23 0622 09/16/23 0443  Weight: 76 kg 76.1 kg 76.3 kg    11.  CAD.  Toprol -XL 12.5 mg BID.  Monitor with increased mobility 12.  Atrial fibrillation.  Transitioning from IV heparin  back to Xarelto .  Continue amiodarone  200 mg twice daily for now.  Continue metoprolol   9/8- rate controlled 13.  CKD stage IIIA.  Follow-up chemistries.  Avoid nephrotoxic medications.  9/4- pt's Cr 1.30- is still in baseline range- con't to monitor  9/8- Cr 1.67 up from 1.30 and prior 1.01- and BUN up to 26- from 13- with CHF, don't want to hold meds or give IVFs- if doesn't improve with pushing fluids today- will hold his diuretics tomorrow  9/9- same numbers- on BUN/Cr- will decrease Torsemide  to 10 mg daily as well as give 500ml of NS IVFs- to tank him up slightly 14.  Hyperlipidemia.  Crestor  10mg  daily 15.  History of prostate cancer.  Flomax  0.4 mg daily.- per PA, was 2022- pt said doesn't remember when  but more than 3 years ago. 16.  Constipation.  MiraLAX  twice daily             - LBM 9/2, continent 9/9- LBM overnight 17.R foot drop-chronic, has AFO 9/4- from Hip surgery in January- went over with pt just the hip fx could have caused the R foot drop- we can do E stim- since has been since 2017 since prostate CA 18. Leukocytosis  9/8- WBC 11k- don't see reason- d/w nursing- incision looks stable- no URI/UTI Sx's- will recheck in AM  9/9- WBC down to 9.6- so is fine- per nursing and picture, incision slightly localized erythema, but ok     I spent a total of  39  minutes on total care today- >50% coordination of care- due to  D/w pt about wound, UTI/URI Sx's- also about fluid-levels- made multiple changes to help with this- will recheck in AM- also team conference today to f/u on progress  LOS: 6 days A FACE TO FACE EVALUATION WAS PERFORMED  Calyx Hawker 09/16/2023, 10:30 AM

## 2023-09-16 NOTE — Plan of Care (Signed)
  Problem: SCI BOWEL ELIMINATION Goal: RH STG MANAGE BOWEL WITH ASSISTANCE Description: STG Manage Bowel with supervision Assistance. Outcome: Progressing   Problem: SCI BLADDER ELIMINATION Goal: RH STG MANAGE BLADDER WITH ASSISTANCE Description: STG Manage Bladder With supervision  Assistance Outcome: Progressing   Problem: RH SKIN INTEGRITY Goal: RH STG SKIN FREE OF INFECTION/BREAKDOWN Description: Manage skin free of infection with supervision assistance Outcome: Progressing

## 2023-09-16 NOTE — Progress Notes (Signed)
 Patient ID: Aaron Richmond, male   DOB: 08/29/1945, 78 y.o.   MRN: [id number]  SW met with pt and pt wife in room to provide updates from team conference, and d/c date 9/17. SW will continue to provide updates.   Graeme Jude, MSW, LCSW Office: [phone] Cell: [phone] Fax: [phone]

## 2023-09-17 LAB — BASIC METABOLIC PANEL WITH GFR
Anion gap: 15 (ref 5–15)
BUN: 25 mg/dL — ABNORMAL HIGH (ref 8–23)
CO2: 23 mmol/L (ref 22–32)
Calcium: 9 mg/dL (ref 8.9–10.3)
Chloride: 99 mmol/L (ref 98–111)
Creatinine, Ser: 1.39 mg/dL — ABNORMAL HIGH (ref 0.61–1.24)
GFR, Estimated: 52 mL/min — ABNORMAL LOW (ref >60)
Glucose, Bld: 102 mg/dL — ABNORMAL HIGH (ref 70–99)
Potassium: 4.5 mmol/L (ref 3.5–5.1)
Sodium: 137 mmol/L (ref 135–145)

## 2023-09-17 NOTE — Progress Notes (Signed)
 Orthopedic Tech Progress Note Patient Details:  Aaron Richmond August 31, 1945 [id number]  Called in order to [facility] for an AFO CONSULT   Patient ID: [name], male   DOB: 09-11-1945, 78 y.o.   MRN: [id number]  Delanna LITTIE Pac 09/17/2023, 9:04 AM

## 2023-09-17 NOTE — Progress Notes (Signed)
 Occupational Therapy Weekly Progress Note  Patient Details  Name: UGO THOMA MRN: [id number] Date of Birth: 09/12/1945  Beginning of progress report period: September 11, 2023 End of progress report period: September 17, 2023  Today's Date: 09/17/2023 OT Individual Time: 9199-9086 OT Individual Time Calculation (min): 73 min    Patient has met 3 of 4 short term goals.  Patient needs continued assist with donning LSO and continued training on AE for donning shoes with AFO.  Patient continues to demonstrate the following deficits: muscle weakness and therefore will continue to benefit from skilled OT intervention to enhance overall performance with BADL and iADL.  Patient progressing toward long term goals..  Continue plan of care.  OT Short Term Goals Week 2:  See LTG's 2/2 length of stay.  Skilled Therapeutic Interventions/Progress Updates:Patient received resting in bed. Agreeable to OT treatment and assessment of STG's. Patient with ADL levels as listed below. Continues to progress towards Mod I/SPV LTG's, but needs continued practice with use of long shoe horn and reacher for LE dressing. Patient reports he may get a new AFO and that may alter how he is able to slip into his shoes. Patient continued treatment with walker mobility working on activitiy tolerance and endurance walking from room to day room and around the nurses station without needing a rest break. Patient returned to his bed at his request with call light, phone and bed side table items in reach. Continue working towards LTG's.       Therapy Documentation Precautions:  Precautions Precautions: Fall, Back Recall of Precautions/Restrictions: Intact Required Braces or Orthoses: Spinal Brace, Other Brace Spinal Brace: Lumbar corset Other Brace: AFO RLE Restrictions Weight Bearing Restrictions Per Provider Order: No General:   Vital Signs: Therapy Vitals Temp: 98.1 F (36.7 C) Pulse Rate: 71 Resp: 18 BP:  (!) 114/47 Patient Position (if appropriate): Lying Oxygen Therapy SpO2: 94 % O2 Device: Room Air Pain:   ADL: ADL Equipment Provided: Reacher Eating: Set up Where Assessed-Eating: Bed level Grooming: Setup Where Assessed-Grooming: Standing at sink Upper Body Bathing: set up Where Assessed-Upper Body Bathing: shower Lower Body Bathing: CGA Where Assessed-Lower Body Bathing: shower Upper Body Dressing: Minimal assistance with brace Where Assessed-Upper Body Dressing: Edge of bed Lower Body Dressing: Min assist with shorts Where Assessed-Lower Body Dressing: Edge of bed Toileting: SPV Where Assessed-Toileting: Teacher, adult education: SBA Statistician Method: Proofreader: Raised toilet seat, Bedside commode, Acupuncturist: SBA Film/video editor Method: Ambulating     Therapy/Group: Individual Therapy  Isaiah JONETTA Freund 09/17/2023, 12:41 PM

## 2023-09-17 NOTE — Progress Notes (Addendum)
 Physical Therapy Session Note  Patient Details  Name: Aaron Richmond MRN: [id number] Date of Birth: [birth year]  Today's Date: 09/17/2023 PT Individual Time: 1050-1205 and 1425-1540  PT Individual Time Calculation (min): 75 min and 75 min  Short Term Goals: Week 1:  PT Short Term Goal 1 (Week 1): Pt will complete 3 consecutive sit to stand transfers w/ CGA PT Short Term Goal 2 (Week 1): Pt will perform 4 stairs w/ one handrail and side-stepping w/ minA PT Short Term Goal 3 (Week 1): Pt will demonstrate proper technique for sit to stand, using BUE to push off WC and not RW, 3 consecutive transfers in a row.  Skilled Therapeutic Interventions/Progress Updates: Pt presented in bed agreeable to therapy. Pt states pain 4/10 recently medicated. Pt completed bed mobility with supervision and use of bed features. Pt donned LSO with set up and was able to maintain spinal precautions. Pt completed ambulatory transfer to w/c and transported to main gym for energy conservation. Pt participated in ambulation ~136ft in gym with CGA. Pt required increased time to power up to stand. Pt then participated and completed Berg Balance assessment. Patient demonstrates increased fall risk as noted by score of  35/56 on Berg Balance Scale.  (<36= high risk for falls, close to 100%; 37-45 significant >80%; 46-51 moderate >50%; 52-55 lower >25%). Discussed with pt use of assessment and assists in area that could be worked on. Advised at this time safest practice is to use RW for improved stability. Pt then ambulated back to room with RW and CGA although noted forward flexed posture. Pt agreeable to remain in w/c for lunch, therefore pt left in w/c at end of session with call bell within reach and needs met.   Tx2: Pt presented in bed agreeable to therapy. Pt states pain 4/10. Performed bed mobility with supervision, use of bed features, and increased time. Pt donned LSO with set up. Pt stood with CGA and increased time and  ambulated to 4W nsg station. Pt transported remaining distance to main gym. Participated in ascending/descending 2 x 4 stairs with B rails with step to pattern. Pt indicated increased pain during descending with PTA providing cues for improving erect posture.  Pt then ambulated across gym with RW and CGA. Pt participated in standing balance activities including use of peg board without RW support. Pt also participated in horsehoes with single LE on 2in step. Pt also participated in gait training stepping over threshold including shuffleboard sticks, and small hurdles with CGA. Pt then transported to day room and participated in NuStep L4 x 8 min with PTA encouraging pt to maintain >35 SPM. Pt then ambulated back to room with RW and CGA with noted decreased R foot clearance due to fatigue. At EOB pt doffed LSO and completed sit to supine with supervision and use of bed feautres. Pt left in bed at end of session with call bell within reach and needs met.       Therapy Documentation Precautions:  Precautions Precautions: Fall, Back Recall of Precautions/Restrictions: Intact Required Braces or Orthoses: Spinal Brace, Other Brace Spinal Brace: Lumbar corset Other Brace: AFO RLE Restrictions Weight Bearing Restrictions Per Provider Order: No General:   Vital Signs: Therapy Vitals Temp: 97.8 F (36.6 C) Temp Source: Oral Pulse Rate: 82 Resp: 17 BP: 134/61 Patient Position (if appropriate): Lying Oxygen Therapy SpO2: 96 % O2 Device: Room Air Pain: Pain Assessment Pain Scale: 0-10 Pain Score: 10-Worst pain ever Pain Location: Back Pain Intervention(s): Medication (  See eMAR)    Therapy/Group: Individual Therapy  Aaron Richmond 09/17/2023, 4:37 PM

## 2023-09-17 NOTE — Progress Notes (Signed)
 PROGRESS NOTE   Subjective/Complaints:  Pt reports still sore- no major complaints- hopeful that Cr is better, since has drank more for me.   Asking a lot of family/geography based questions ROS:   Pt denies SOB, abd pain, CP, N/V/C/D, and vision changes   Per HPI   Objective:   No results found.  Recent Labs    09/15/23 0521 09/16/23 0454  WBC 11.0* 9.6  HGB 9.1* 9.1*  HCT 27.6* 27.6*  PLT 451* 490*    Recent Labs    09/16/23 0454 09/17/23 0725  NA 135 137  K 4.4 4.5  CL 99 99  CO2 24 23  GLUCOSE 110* 102*  BUN 26* 25*  CREATININE 1.67* 1.39*  CALCIUM  9.0 9.0     Intake/Output Summary (Last 24 hours) at 09/17/2023 1917 Last data filed at 09/17/2023 1323 Gross per 24 hour  Intake 472 ml  Output 1000 ml  Net -528 ml        Physical Exam: Vital Signs Blood pressure 134/61, pulse 82, temperature 97.8 F (36.6 C), temperature source Oral, resp. rate 17, height 5' 11 (1.803 m), weight 76 kg, SpO2 96%.       General: awake, alert, appropriate, supine in bed- just woke him up; NAD HENT: conjugate gaze; oropharynx moist CV: regular rate- rate controlled; no JVD Pulmonary: CTA B/L; no W/R/R- good air movement GI: soft, NT, ND, (+)BS Psychiatric: appropriate but asks a lot of questions Neurological: Ox3  Extremities; no LE edema- was wearing Prevalons- took them off since up  Skin: Bruising on b/l UE, Incision with dressing CDI, foam dressing both feet   PRIOR EXAMS: Neurological: very groggy this AM- took 3-4 minutes to be able to answer questions appropriately- (last pain meds 521am)  Neuro:     Mental Status: AAOx4, memory grossly intact Speech/Languate: fluent, follows commands CRANIAL NERVES:2-12  intact     MOTOR: RUE: 4+/5 Deltoid,4+/5 Biceps, 4+/5 Triceps,4+/5 Grip LUE: 4+/5 Deltoid, 4+/5 Biceps, 4+/5 Triceps, 4+/5 Grip RLE: HF 4-/5, KE 4+/5, ADF 0/5, APF 4/5 LLE: HF 4-/5,  KE 4+/5, ADF 4/5, APF 4/5     REFLEXES: NO ankle clonus   SENSORY: Normal to touch all 4 extremities   Coordination: No UE ataxia/dysmetria noted   Picc R arm, IV L arm   Assessment/Plan: 1. Functional deficits which require 3+ hours per day of interdisciplinary therapy in a comprehensive inpatient rehab setting. Physiatrist is providing close team supervision and 24 hour management of active medical problems listed below. Physiatrist and rehab team continue to assess barriers to discharge/monitor patient progress toward functional and medical goals  Care Tool:  Bathing    Body parts bathed by patient: Right arm, Left arm, Chest, Abdomen, Front perineal area, Buttocks, Right upper leg, Left upper leg   Body parts bathed by helper: Right lower leg, Left lower leg     Bathing assist Assist Level: Minimal Assistance - Patient > 75%     Upper Body Dressing/Undressing Upper body dressing   What is the patient wearing?: Pull over shirt, Orthosis Orthosis activity level: Performed by patient  Upper body assist Assist Level: Supervision/Verbal cueing    Lower Body Dressing/Undressing  Lower body dressing      What is the patient wearing?: Underwear/pull up, Pants     Lower body assist Assist for lower body dressing: Minimal Assistance - Patient > 75%     Toileting Toileting    Toileting assist Assist for toileting: Minimal Assistance - Patient > 75%     Transfers Chair/bed transfer  Transfers assist     Chair/bed transfer assist level: Contact Guard/Touching assist     Locomotion Ambulation   Ambulation assist      Assist level: Contact Guard/Touching assist Assistive device: Walker-rolling Max distance: 130   Walk 10 feet activity   Assist     Assist level: Contact Guard/Touching assist Assistive device: Walker-rolling   Walk 50 feet activity   Assist    Assist level: Contact Guard/Touching assist Assistive device: Walker-rolling     Walk 150 feet activity   Assist Walk 150 feet activity did not occur: Safety/medical concerns  Assist level: Contact Guard/Touching assist Assistive device: Walker-rolling    Walk 10 feet on uneven surface  activity   Assist Walk 10 feet on uneven surfaces activity did not occur: Safety/medical concerns         Wheelchair     Assist Is the patient using a wheelchair?: Yes Type of Wheelchair: Manual    Wheelchair assist level: Supervision/Verbal cueing Max wheelchair distance: 150    Wheelchair 50 feet with 2 turns activity    Assist        Assist Level: Contact Guard/Touching assist   Wheelchair 150 feet activity     Assist      Assist Level: Contact Guard/Touching assist   Blood pressure 134/61, pulse 82, temperature 97.8 F (36.6 C), temperature source Oral, resp. rate 17, height 5' 11 (1.803 m), weight 76 kg, SpO2 96%.  Medical Problem List and Plan: 1. Functional deficits secondary to L1 burst fracture- with traumatic incomplete ASIA D paraplegia with cord compression status post T10-L4 fusion with transpedicular decompression L1.  Lumbar corset when out of bed applied in sitting position             -patient may shower             -ELOS/Goals: 10-12 days, PT/OT mina/sup             Con't CIR PT and OT  D/c date 9/17 Main limitation- LE weakness- can do Estim for R foot drop- has been >2 years since prostate CA 2.  Antithrombotics: -DVT/anticoagulation:  Pharmaceutical: Heparin  transitioning back to Xarelto  9/4- Xarelto  20mg  daily to start today             -antiplatelet therapy: N/A 3. Pain Management: Tramadol /hydrocodone  as needed, Robaxin  500 mg 4 times daily 9/5- Pt reports pain bothering him this AM- but hasn't had pain meds- asked nursing to get-  9/8-9/10- just sore- con't regimen 4. Mood/Behavior/Sleep: Provide emotional support             -antipsychotic agents: N/A 5. Neuropsych/cognition: This patient is capable of making  decisions on his own behalf. 6. Skin/Wound Care: Routine skin checks  9/8- bruising on feet- will monitor 7. Fluids/Electrolytes/Nutrition: Routine in and outs with follow-up chemistries. Continue K supplementation, vitamins, and protonix  40mg  daily 8.  Acute blood loss anemia.  Follow-up CBC. Hgb stable 9.1 on 9/4 9.  Shock/narrow complex tachycardia with hypotension.  Resolved.  Follow-up cardiology services 10.  Chronic systolic congestive heart failure/nonischemic CMP.  Demadex  20 mg daily, Aldactone  25 mg daily, Farxiga  10  mg daily.  Monitor for any fluid overload. Daily weights  9/4- Last weight yesterday 75 kg- will ask nursing to get weight daily  9/5- Weight 75.4 kg- stable  -09/14/23 wt stable today. Monitor.   9/8- Weight over all stable   9/9- weight stable but Cr 1.67- will decrease Torsemide  to 10 mg daily for now- 9/10- weight stable even after reducing Torsemide  so far  [facility] Weights   09/15/23 0622 09/16/23 0443 09/17/23 0533  Weight: 76.1 kg 76.3 kg 76 kg    11.  CAD.  Toprol -XL 12.5 mg BID.  Monitor with increased mobility 12.  Atrial fibrillation.  Transitioning from IV heparin  back to Xarelto .  Continue amiodarone  200 mg twice daily for now.  Continue metoprolol   9/8-9/10- rate controlled 13.  CKD stage IIIA.  Follow-up chemistries.  Avoid nephrotoxic medications.  9/4- pt's Cr 1.30- is still in baseline range- con't to monitor  9/8- Cr 1.67 up from 1.30 and prior 1.01- and BUN up to 26- from 13- with CHF, don't want to hold meds or give IVFs- if doesn't improve with pushing fluids today- will hold his diuretics tomorrow  9/9- same numbers- on BUN/Cr- will decrease Torsemide  to 10 mg daily as well as give 500ml of NS IVFs- to tank him up slightly  9/10- Cr down to 1.39 and BUN stable at 25- better- will recheck in AM- since have also reduced Torsemide - also monitor weight.  14.  Hyperlipidemia.  Crestor  10mg  daily 15.  History of prostate cancer.  Flomax  0.4 mg daily.-  per PA, was 2022- pt said doesn't remember when but more than 3 years ago. 16.  Constipation.  MiraLAX  twice daily             - LBM 9/2, continent 9/9- LBM overnight 17.R foot drop-chronic, has AFO 9/4- from Hip surgery in January- went over with pt just the hip fx could have caused the R foot drop- we can do E stim- since has been since 2017 since prostate CA 9/10- ordered AFO consult- his he bought from [employer].  18. Leukocytosis  9/8- WBC 11k- don't see reason- d/w nursing- incision looks stable- no URI/UTI Sx's- will recheck in AM  9/9- WBC down to 9.6- so is fine- per nursing and picture, incision slightly localized erythema, but ok   I spent a total of 35   minutes on total care today- >50% coordination of care- due to  D/w pt about Sx's- went over labs, vitals, weights- ordered labs for AM- also d/w pt's nurse   LOS: 7 days A FACE TO FACE EVALUATION WAS PERFORMED  Sehaj Kolden 09/17/2023, 7:17 PM

## 2023-09-18 DIAGNOSIS — N1831 Chronic kidney disease, stage 3a: Secondary | ICD-10-CM

## 2023-09-18 DIAGNOSIS — D72823 Leukemoid reaction: Secondary | ICD-10-CM

## 2023-09-18 LAB — BASIC METABOLIC PANEL WITH GFR
Anion gap: 14 (ref 5–15)
BUN: 22 mg/dL (ref 8–23)
CO2: 21 mmol/L — ABNORMAL LOW (ref 22–32)
Calcium: 9.1 mg/dL (ref 8.9–10.3)
Chloride: 101 mmol/L (ref 98–111)
Creatinine, Ser: 1.43 mg/dL — ABNORMAL HIGH (ref 0.61–1.24)
GFR, Estimated: 50 mL/min — ABNORMAL LOW (ref >60)
Glucose, Bld: 99 mg/dL (ref 70–99)
Potassium: 4.3 mmol/L (ref 3.5–5.1)
Sodium: 136 mmol/L (ref 135–145)

## 2023-09-18 LAB — CBC
HCT: 29.3 % — ABNORMAL LOW (ref 39.0–52.0)
Hemoglobin: 9.6 g/dL — ABNORMAL LOW (ref 13.0–17.0)
MCH: 30.7 pg (ref 26.0–34.0)
MCHC: 32.8 g/dL (ref 30.0–36.0)
MCV: 93.6 fL (ref 80.0–100.0)
Platelets: 533 K/uL — ABNORMAL HIGH (ref 150–400)
RBC: 3.13 MIL/uL — ABNORMAL LOW (ref 4.22–5.81)
RDW: 15 % (ref 11.5–15.5)
WBC: 9.4 K/uL (ref 4.0–10.5)
nRBC: 0 % (ref 0.0–0.2)

## 2023-09-18 MED ORDER — CARBAMIDE PEROXIDE 6.5 % OT SOLN
5.0000 [drp] | Freq: Two times a day (BID) | OTIC | Status: AC
  Administered 2023-09-18 – 2023-09-19 (×3): 5 [drp] via OTIC
  Filled 2023-09-18: qty 15

## 2023-09-18 NOTE — Progress Notes (Signed)
 Physical Therapy Session Note  Patient Details  Name: Aaron Richmond MRN: [id number] Date of Birth: 09-Nov-1945  Today's Date: 09/18/2023 PT Individual Time: 0800-0830, 1300-1400 PT Individual Time Calculation (min): 30 min, 60 min  Short Term Goals: Week 1:  PT Short Term Goal 1 (Week 1): Pt will complete 3 consecutive sit to stand transfers w/ CGA PT Short Term Goal 2 (Week 1): Pt will perform 4 stairs w/ one handrail and side-stepping w/ minA PT Short Term Goal 3 (Week 1): Pt will demonstrate proper technique for sit to stand, using BUE to push off WC and not RW, 3 consecutive transfers in a row.  Skilled Therapeutic Interventions/Progress Updates:   AM Session: Pt received supine in bed, agreeable to therapy. Pt required total A for donning ted hose, maxA for donning shoes. Pt donned LSO w/ supervision, required minimal adjustments to tighten. Pt ambulated ~159ft w/ CGA and RW, to improve gait mechanics, upright tolerance, and decrease caregiver burden. Pt demonstrates forward trunk lean, sufficient B foot clearance w/ R AFO donned, Pt remained upright in WC at end of session w/ call bell and tray in reach. No c/o pain throughout session.   PM Session:  Pt received upright in WC. Olivia, [employer] rep, present. Pt ambulated halfway to main gym w/ RW and SBA before being wheeled rest of way to preserve energy. Trialed new AFO and pt reported an improvement in fit.  Pt ambulated several household distances throughout session, also performed 6 steps 2x4, 4 steps 2x8. Pt required CGA for safety on stairs. Pt also performed 8 step, 2x and demonstrated proper technique. Pt propelled WC forward/backward in hallway (126ft both ways) w/ BLE to improve BLE strength and muscle activation from seated position to improve functional activity tolerance.  At end of session, pt self-propelled halfway back to room,  then wheeled back w/ total A for time. Pt remained upright in WC w/ tray table, call bell,  and all other needs in reach.    Therapy Documentation Precautions:  Precautions Precautions: Fall, Back Recall of Precautions/Restrictions: Intact Required Braces or Orthoses: Spinal Brace, Other Brace Spinal Brace: Lumbar corset Other Brace: AFO RLE Restrictions Weight Bearing Restrictions Per Provider Order: No  Therapy/Group: Individual Therapy  Aaron Richmond 09/18/2023, 12:23 PM

## 2023-09-18 NOTE — Progress Notes (Signed)
 PROGRESS NOTE   Subjective/Complaints:  Complains of being left to sleep until 0800. Also asked about excessive ear wax, clogging up his hearing aids.   ROS: Patient denies fever, rash, sore throat, blurred vision, dizziness, nausea, vomiting, diarrhea, cough, shortness of breath or chest pain, joint or back/neck pain, headache, or mood change.    Objective:   No results found.  Recent Labs    09/16/23 0454 09/18/23 0439  WBC 9.6 9.4  HGB 9.1* 9.6*  HCT 27.6* 29.3*  PLT 490* 533*    Recent Labs    09/17/23 0725 09/18/23 0439  NA 137 136  K 4.5 4.3  CL 99 101  CO2 23 21*  GLUCOSE 102* 99  BUN 25* 22  CREATININE 1.39* 1.43*  CALCIUM  9.0 9.1     Intake/Output Summary (Last 24 hours) at 09/18/2023 0933 Last data filed at 09/18/2023 0454 Gross per 24 hour  Intake 712 ml  Output 1125 ml  Net -413 ml        Physical Exam: Vital Signs Blood pressure (!) 124/44, pulse 72, temperature 98.1 F (36.7 C), temperature source Oral, resp. rate 16, height 5' 11 (1.803 m), weight 72.7 kg, SpO2 94%.      Constitutional: No distress . Vital signs reviewed. HEENT: NCAT, EOMI, oral membranes moist Neck: supple Cardiovascular: RRR without murmur. No JVD    Respiratory/Chest: CTA Bilaterally without wheezes or rales. Normal effort    GI/Abdomen: BS +, non-tender, non-distended Ext: no clubbing, cyanosis, or edema Psych: fairly pleasant and cooperative  Extremities; no LE edema- was wearing Prevalons- took them off since up  Skin: Bruising on b/l UE, Incision with dressing CDI, foam dressing both feet. Shelf AFO RLE     Neurological:  Alert and oriented x 3. Normal insight and awareness. Intact Memory. Normal language and speech. Cranial nerve exam unremarkable. MMT:  MOTOR: RUE: 4+/5 Deltoid,4+/5 Biceps, 4+/5 Triceps,4+/5 Grip LUE: 4+/5 Deltoid, 4+/5 Biceps, 4+/5 Triceps, 4+/5 Grip RLE: HF 4-/5, KE 4+/5, ADF  0/5, APF 4/5 LLE: HF 4-/5, KE 4+/5, ADF 4/5, APF 4/5  Prior neuro assessment is c/w 09/18/2023 exam.      REFLEXES: NO ankle clonus   SENSORY: Normal to touch all 4 extremities   Coordination: No UE ataxia/dysmetria noted   Picc R arm, IV L arm   Assessment/Plan: 1. Functional deficits which require 3+ hours per day of interdisciplinary therapy in a comprehensive inpatient rehab setting. Physiatrist is providing close team supervision and 24 hour management of active medical problems listed below. Physiatrist and rehab team continue to assess barriers to discharge/monitor patient progress toward functional and medical goals  Care Tool:  Bathing    Body parts bathed by patient: Right arm, Left arm, Chest, Abdomen, Front perineal area, Buttocks, Right upper leg, Left upper leg   Body parts bathed by helper: Right lower leg, Left lower leg     Bathing assist Assist Level: Minimal Assistance - Patient > 75%     Upper Body Dressing/Undressing Upper body dressing   What is the patient wearing?: Pull over shirt, Orthosis Orthosis activity level: Performed by patient  Upper body assist Assist Level: Supervision/Verbal cueing  Lower Body Dressing/Undressing Lower body dressing      What is the patient wearing?: Underwear/pull up, Pants     Lower body assist Assist for lower body dressing: Minimal Assistance - Patient > 75%     Toileting Toileting    Toileting assist Assist for toileting: Minimal Assistance - Patient > 75%     Transfers Chair/bed transfer  Transfers assist     Chair/bed transfer assist level: Contact Guard/Touching assist     Locomotion Ambulation   Ambulation assist      Assist level: Contact Guard/Touching assist Assistive device: Walker-rolling Max distance: 130   Walk 10 feet activity   Assist     Assist level: Contact Guard/Touching assist Assistive device: Walker-rolling   Walk 50 feet activity   Assist    Assist  level: Contact Guard/Touching assist Assistive device: Walker-rolling    Walk 150 feet activity   Assist Walk 150 feet activity did not occur: Safety/medical concerns  Assist level: Contact Guard/Touching assist Assistive device: Walker-rolling    Walk 10 feet on uneven surface  activity   Assist Walk 10 feet on uneven surfaces activity did not occur: Safety/medical concerns         Wheelchair     Assist Is the patient using a wheelchair?: Yes Type of Wheelchair: Manual    Wheelchair assist level: Supervision/Verbal cueing Max wheelchair distance: 150    Wheelchair 50 feet with 2 turns activity    Assist        Assist Level: Contact Guard/Touching assist   Wheelchair 150 feet activity     Assist      Assist Level: Contact Guard/Touching assist   Blood pressure (!) 124/44, pulse 72, temperature 98.1 F (36.7 C), temperature source Oral, resp. rate 16, height 5' 11 (1.803 m), weight 72.7 kg, SpO2 94%.  Medical Problem List and Plan: 1. Functional deficits secondary to L1 burst fracture- with traumatic incomplete ASIA D paraplegia with cord compression status post T10-L4 fusion with transpedicular decompression L1.  Lumbar corset when out of bed applied in sitting position             -patient may shower             -ELOS/Goals: 10-12 days, PT/OT mina/sup              D/c date 9/17 Main limitation- LE weakness- can do Estim for R foot drop- has been >2 years since prostate CA -to be measured for AFO today 9/11 2.  Antithrombotics: -DVT/anticoagulation:  Pharmaceutical: Heparin  transitioning back to Xarelto  9/4- Xarelto  20mg  daily to start today             -antiplatelet therapy: N/A 3. Pain Management: Tramadol /hydrocodone  as needed, Robaxin  500 mg 4 times daily 9/5- Pt reports pain bothering him this AM- but hasn't had pain meds- asked nursing to get-  9/8-9/11- just sore- con't regimen 4. Mood/Behavior/Sleep: Provide emotional support              -antipsychotic agents: N/A 5. Neuropsych/cognition: This patient is capable of making decisions on his own behalf. 6. Skin/Wound Care: Routine skin checks  9/8- bruising on feet- will monitor 7. Fluids/Electrolytes/Nutrition: Routine in and outs with follow-up chemistries. Continue K supplementation, vitamins, and protonix  40mg  daily 8.  Acute blood loss anemia.  Follow-up CBC. Hgb stable 9.1 on 9/4 9.  Shock/narrow complex tachycardia with hypotension.  Resolved.  Follow-up cardiology services 10.  Chronic systolic congestive heart failure/nonischemic CMP.  Demadex  20 mg daily,  Aldactone  25 mg daily, Farxiga  10 mg daily.  Monitor for any fluid overload. Daily weights  9/4- Last weight yesterday 75 kg- will ask nursing to get weight daily  9/5- Weight 75.4 kg- stable  -09/14/23 wt stable today. Monitor.   9/8- Weight over all stable   9/9- weight stable but Cr 1.67- will decrease Torsemide  to 10 mg daily for now- 9/11- weight down today. ?correct Filed Weights   09/16/23 0443 09/17/23 0533 09/18/23 0513  Weight: 76.3 kg 76 kg 72.7 kg    11.  CAD.  Toprol -XL 12.5 mg BID.  Monitor with increased mobility 12.  Atrial fibrillation.  Transitioning from IV heparin  back to Xarelto .  Continue amiodarone  200 mg twice daily for now.  Continue metoprolol   9/8-9/10- rate controlled 13.  CKD stage IIIA.  Follow-up chemistries.  Avoid nephrotoxic medications.  9/4- pt's Cr 1.30- is still in baseline range- con't to monitor  9/8- Cr 1.67 up from 1.30 and prior 1.01- and BUN up to 26- from 13- with CHF, don't want to hold meds or give IVFs- if doesn't improve with pushing fluids today- will hold his diuretics tomorrow  9/9- same numbers- on BUN/Cr- will decrease Torsemide  to 10 mg daily as well as give 500ml of NS IVFs- to tank him up slightly  9/10- Cr down to 1.39 and BUN stable at 25- better- will recheck in AM- since have also reduced Torsemide - also monitor weight.  14.  Hyperlipidemia.  Crestor   10mg  daily 15.  History of prostate cancer.  Flomax  0.4 mg daily.- per PA, was 2022- pt said doesn't remember when but more than 3 years ago. 16.  Constipation.  MiraLAX  twice daily             - LBM 9/2, continent 9/10- LBM  17.R foot drop-chronic, has AFO 9/4- from Hip surgery in January- went over with pt just the hip fx could have caused the R foot drop- we can do E stim- since has been since 2017 since prostate CA   18. Leukocytosis  9/8- WBC 11k- don't see reason- d/w nursing- incision looks stable- no URI/UTI Sx's- will recheck in AM  9/9- WBC down to 9.6- so is fine- per nursing and picture, incision slightly localized erythema, but ok  9/11 wbc's normal   19. Ear wax. Mentioned that it had  nothing to do with his back/neuro dx.   -will try debrox for 3 days   LOS: 8 days A FACE TO FACE EVALUATION WAS PERFORMED  Arthea ONEIDA Gunther 09/18/2023, 9:33 AM

## 2023-09-18 NOTE — Progress Notes (Signed)
 Occupational Therapy Session Note  Patient Details  Name: [name] MRN: [id number] Date of Birth: 1945/10/11  Today's Date: 09/18/2023 OT Individual Time: 0945-1100 OT Individual Time Calculation (min): 75 min    Short Term Goals: Week 2:  OT Short Term Goal 1 (Week 2): See LTG's 2/2 length of stay  Skilled Therapeutic Interventions/Progress Updates:    Skilled OT intervention with focus on functional amb with RW, standing balance, toileting, bathing at shower level, dressing with sit<>stand from w/c, sit<>stand from std chair (no arm rests), and safety awareness to increase independence with BADLs. All amb with RW at [facility]. Sit<>stand from w/c with close supervision. Sit<>stand from std chair with CGA. Standing balance in shower using grab bars with close supervision. Pt amb with RW to day room and returned to room with rest break in day room. Pt reports he feels stronger each day. Reviewed home safety recommendations. Pt able to doff/don LSO without assistance. Pt remained in w/c with all needs within reach.   Therapy Documentation Precautions:  Precautions Precautions: Fall, Back Recall of Precautions/Restrictions: Intact Required Braces or Orthoses: Spinal Brace, Other Brace Spinal Brace: Lumbar corset Other Brace: AFO RLE Restrictions Weight Bearing Restrictions Per Provider Order: No   Pain:  Pt c/o 410 back pain/soreness; shower and repositioned with relief noted  Therapy/Group: Individual Therapy  [name] 09/18/2023, 11:22 AM

## 2023-09-18 NOTE — Progress Notes (Signed)
 Physical Therapy Session Note  Patient Details  Name: Aaron Richmond MRN: [id number] Date of Birth: Nov 11, 1945  Today's Date: 09/18/2023 PT Individual Time: 1435-1530 PT Individual Time Calculation (min): 55 min   Short Term Goals: Week 1:  PT Short Term Goal 1 (Week 1): Pt will complete 3 consecutive sit to stand transfers w/ CGA PT Short Term Goal 2 (Week 1): Pt will perform 4 stairs w/ one handrail and side-stepping w/ minA PT Short Term Goal 3 (Week 1): Pt will demonstrate proper technique for sit to stand, using BUE to push off WC and not RW, 3 consecutive transfers in a row.  Skilled Therapeutic Interventions/Progress Updates: Pt presented in w/c agreeable to therapy. Pt states pain 5/10, no intervention requested at this time, states will ask for pain meds after session. Pt taken outside to [facility] entrance for enironmental change. Pt participated in seated LE therex for general strengthening as follows: LAQ 2 x 10, Seated marches 2 x 10 and standing hip abd/add 2 x 10. Pt also participated in ambulation on outdoor setting with RW and CGA. Pt noted to have significantly decreased B foot clearance with fatigue. PTA had pt sit on bench after ambulation (lower level), required minA to power up to stand due to significant use of BUE to push up. Pt transferred to w/c and transported to brick lay area. Pt then ambulated on different surface ~139ft with noted shuffling of feet, pt able to clear feet but back to shuffling within 203 steps. Pt transported back to room at end of session and pt remained in w/c with wife and visitor present and current needs met.      Therapy Documentation Precautions:  Precautions Precautions: Fall, Back Recall of Precautions/Restrictions: Intact Required Braces or Orthoses: Spinal Brace, Other Brace Spinal Brace: Lumbar corset Other Brace: AFO RLE Restrictions Weight Bearing Restrictions Per Provider Order: No General:   Vital Signs:   Pain: Pain  Assessment Pain Scale: 0-10 Pain Score: 4  Pain Location: Back Pain Intervention(s): Medication (See eMAR)    Therapy/Group: Individual Therapy  Kathaleen Dudziak 09/18/2023, 3:43 PM

## 2023-09-19 MED ORDER — DOXYCYCLINE HYCLATE 100 MG PO TABS
100.0000 mg | ORAL_TABLET | Freq: Two times a day (BID) | ORAL | Status: DC
  Administered 2023-09-19 – 2023-09-23 (×10): 100 mg via ORAL
  Filled 2023-09-19 (×10): qty 1

## 2023-09-19 MED ORDER — MEDIHONEY WOUND/BURN DRESSING EX PSTE
1.0000 | PASTE | Freq: Every day | CUTANEOUS | Status: DC
  Administered 2023-09-19 – 2023-09-24 (×5): 1 via TOPICAL
  Filled 2023-09-19 (×2): qty 44

## 2023-09-19 NOTE — Consult Note (Addendum)
 WOC Nurse Consult Note: Reason for Consult: Consult requested for back wound. Performed remotely after review of progress notes and photos in the EMR.  Pt had back surgery recently on 8/7, which was performed by Dr Onetha of the neurosurgical team. The surgical site is beginning to dehisce slightly and has slough.  Topical treatment orders provided for bedside nurses to perform as follows to assist with removal of nonviable tissue: Apply Medihoney to back wound Q day, then cover with foam dressing.  Change foam dressing Q 3 days or PRN soiling. Secure chat message sent to the primary team as follows:  I will put in topical treatment orders for this patient's back wound as requested, but since he had recent neurosurgery to this location, I think their team should be re-consulted.   Please re-consult if further assistance is needed.  Thank-you,  Stephane Fought MSN, RN, CWOCN, CWCN-AP, CNS Contact Mon-Fri 0700-1500: [phone]

## 2023-09-19 NOTE — Progress Notes (Signed)
 Physical Therapy Session Note  Patient Details  Name: Aaron Richmond MRN: [id number] Date of Birth: 11-22-1945  Today's Date: 09/19/2023 PT Individual Time: 1131-1204 PT Individual Time Calculation (min): 33 min   Short Term Goals: Week 1:  PT Short Term Goal 1 (Week 1): Pt will complete 3 consecutive sit to stand transfers w/ CGA PT Short Term Goal 2 (Week 1): Pt will perform 4 stairs w/ one handrail and side-stepping w/ minA PT Short Term Goal 3 (Week 1): Pt will demonstrate proper technique for sit to stand, using BUE to push off WC and not RW, 3 consecutive transfers in a row. Week 2:     Skilled Therapeutic Interventions/Progress Updates:  Patient *** on entrance to room. Patient alert and agreeable to PT session.   Patient with no pain complaint at start of session.  Therapeutic Activity: Bed Mobility: Pt performed supine <> sit with ***. VC/ tc required for ***. Transfers: Pt performed sit<>stand and stand pivot transfers throughout session with ***. Provided vc/ tc for***.  Gait Training:  Pt ambulated *** ft using *** with ***. Demonstrated ***. Provided vc/ tc for ***.  Wheelchair Mobility:  Pt propelled wheelchair *** feet with ***. Provided vc/ tc for ***.  Neuromuscular Re-ed: NMR facilitated during session with focus on ***. Pt guided in ***. NMR performed for improvements in motor control and coordination, balance, sequencing, judgement, and self confidence/ efficacy in performing all aspects of mobility at highest level of independence.   Therapeutic Exercise: Pt performed the following exercises with vc/ tc for proper technique. ***  Patient *** at end of session with brakes locked, *** alarm set, and all needs within reach.   Therapy Documentation Precautions:  Precautions Precautions: Fall, Back Recall of Precautions/Restrictions: Intact Required Braces or Orthoses: Spinal Brace, Other Brace Spinal Brace: Lumbar corset Other Brace: AFO  RLE Restrictions Weight Bearing Restrictions Per Provider Order: No  Pain: Pain Assessment Pain Scale: 0-10 Pain Score: 4  Pain Location: Back Pain Intervention(s): Medication (See eMAR)     Therapy/Group: Individual Therapy  Mliss DELENA Milliner PT, DPT, CSRS 09/19/2023, 6:41 PM

## 2023-09-19 NOTE — Progress Notes (Signed)
 Occupational Therapy Session Note  Patient Details  Name: Aaron Richmond MRN: [id number] Date of Birth: [birth year]  Today's Date: 09/19/2023 OT Individual Time: 9069-8954 OT Individual Time Calculation (min): 75 min    Short Term Goals: Week 2:  OT Short Term Goal 1 (Week 2): See LTG's 2/2 length of stay  Skilled Therapeutic Interventions/Progress Updates:    Skilled OT intervention with focus on sit<>stand, standing balance, functional amb with RW, bathing at shower level, dressing with sit<>stand, and activity tolerance to increase independence with BADLs. Bathing at shower level with CGA. Pt stood in shower to wash buttocks. Pt used LHS to bathe feet and lower legs. Dressing with sit<>stand at [facility] when pulling pants over hips. Tot A for donning ted hose, shoes, and Rt AFO. Amb with RW to day room. Cornhole with CGA. Pt stood at table to clean bean bags with CGA. Pt requested to use toilet and amb with RW to restroom off of day room. CGA for toileting tasks. Pt returned to room and remained in w/c. All needs within reach.   Therapy Documentation Precautions:  Precautions Precautions: Fall, Back Recall of Precautions/Restrictions: Intact Required Braces or Orthoses: Spinal Brace, Other Brace Spinal Brace: Lumbar corset Other Brace: AFO RLE Restrictions Weight Bearing Restrictions Per Provider Order: No   Pain:  Pt reports soreness in back (unrated); shower and rest as appropriate   Therapy/Group: Individual Therapy  [name] 09/19/2023, 12:10 PM

## 2023-09-19 NOTE — Progress Notes (Addendum)
 PROGRESS NOTE   Subjective/Complaints:  Pt without new complaints. No issues with debrox so far. Fels that back pain is improving a little bit at a time. Received his AFO and it's fitting well so far. Wound changes noted by RN.  ROS: Patient denies fever, rash, sore throat, blurred vision, dizziness, nausea, vomiting, diarrhea, cough, shortness of breath or chest pain, joint or back/neck pain, headache, or mood change.    Objective:   No results found.  Recent Labs    09/18/23 0439  WBC 9.4  HGB 9.6*  HCT 29.3*  PLT 533*    Recent Labs    09/17/23 0725 09/18/23 0439  NA 137 136  K 4.5 4.3  CL 99 101  CO2 23 21*  GLUCOSE 102* 99  BUN 25* 22  CREATININE 1.39* 1.43*  CALCIUM  9.0 9.1     Intake/Output Summary (Last 24 hours) at 09/19/2023 1047 Last data filed at 09/19/2023 0241 Gross per 24 hour  Intake --  Output 675 ml  Net -675 ml        Physical Exam: Vital Signs Blood pressure (!) 136/51, pulse 74, temperature 97.9 F (36.6 C), resp. rate 16, height 5' 11 (1.803 m), weight 71.9 kg, SpO2 94%.      Constitutional: No distress . Vital signs reviewed. HEENT: NCAT, EOMI, oral membranes moist Neck: supple Cardiovascular: RRR without murmur. No JVD    Respiratory/Chest: CTA Bilaterally without wheezes or rales. Normal effort    GI/Abdomen: BS +, non-tender, non-distended Ext: no clubbing, cyanosis, or edema Psych: pleasant and cooperative  Extremities; no LE edema- was wearing Prevalons- took them off since up  Skin: Bruising on b/l UE,  foam dressing both feet.    09/16/23 image of back   09/19/23 image of back incision    Neurological:  Alert and oriented x 3. Normal insight and awareness. Intact Memory. Normal language and speech. Cranial nerve exam unremarkable. MMT:  MOTOR: RUE: 4+/5 Deltoid,4+/5 Biceps, 4+/5 Triceps,4+/5 Grip LUE: 4+/5 Deltoid, 4+/5 Biceps, 4+/5 Triceps, 4+/5 Grip RLE:  HF 4-/5, KE 4+/5, ADF 0/5, APF 4/5 LLE: HF 4-/5, KE 4+/5, ADF 4/5, APF 4/5  Prior neuro assessment is c/w 09/19/2023 exam.      REFLEXES: NO ankle clonus   SENSORY: Normal to touch all 4 extremities   Coordination: No UE ataxia/dysmetria noted   Picc R arm, IV L arm   Assessment/Plan: 1. Functional deficits which require 3+ hours per day of interdisciplinary therapy in a comprehensive inpatient rehab setting. Physiatrist is providing close team supervision and 24 hour management of active medical problems listed below. Physiatrist and rehab team continue to assess barriers to discharge/monitor patient progress toward functional and medical goals  Care Tool:  Bathing    Body parts bathed by patient: Right arm, Left arm, Chest, Abdomen, Front perineal area, Buttocks, Right upper leg, Left upper leg   Body parts bathed by helper: Right lower leg, Left lower leg     Bathing assist Assist Level: Minimal Assistance - Patient > 75%     Upper Body Dressing/Undressing Upper body dressing   What is the patient wearing?: Pull over shirt, Orthosis Orthosis activity level: Performed by  patient  Upper body assist Assist Level: Supervision/Verbal cueing    Lower Body Dressing/Undressing Lower body dressing      What is the patient wearing?: Pants     Lower body assist Assist for lower body dressing: Contact Guard/Touching assist     Toileting Toileting    Toileting assist Assist for toileting: Contact Guard/Touching assist     Transfers Chair/bed transfer  Transfers assist     Chair/bed transfer assist level: Contact Guard/Touching assist     Locomotion Ambulation   Ambulation assist      Assist level: Contact Guard/Touching assist Assistive device: Walker-rolling Max distance: 130   Walk 10 feet activity   Assist     Assist level: Contact Guard/Touching assist Assistive device: Walker-rolling   Walk 50 feet activity   Assist    Assist level:  Contact Guard/Touching assist Assistive device: Walker-rolling    Walk 150 feet activity   Assist Walk 150 feet activity did not occur: Safety/medical concerns  Assist level: Contact Guard/Touching assist Assistive device: Walker-rolling    Walk 10 feet on uneven surface  activity   Assist Walk 10 feet on uneven surfaces activity did not occur: Safety/medical concerns         Wheelchair     Assist Is the patient using a wheelchair?: Yes Type of Wheelchair: Manual    Wheelchair assist level: Supervision/Verbal cueing Max wheelchair distance: 150    Wheelchair 50 feet with 2 turns activity    Assist        Assist Level: Contact Guard/Touching assist   Wheelchair 150 feet activity     Assist      Assist Level: Contact Guard/Touching assist   Blood pressure (!) 136/51, pulse 74, temperature 97.9 F (36.6 C), resp. rate 16, height 5' 11 (1.803 m), weight 71.9 kg, SpO2 94%.  Medical Problem List and Plan: 1. Functional deficits secondary to L1 burst fracture- with traumatic incomplete ASIA D paraplegia with cord compression status post T10-L4 fusion with transpedicular decompression L1.  Lumbar corset when out of bed applied in sitting position             -patient may shower             -ELOS/Goals: 09/24/23, PT/OT mina/sup            Main limitation- LE weakness- can do Estim for R foot drop- has been >2 years since prostate CA -received AFO 9/11 2.  Antithrombotics: -DVT/anticoagulation:  Pharmaceutical: Heparin  transitioning back to Xarelto  9/4- Xarelto  20mg  daily to start today             -antiplatelet therapy: N/A 3. Pain Management: Tramadol /hydrocodone  as needed, Robaxin  500 mg 4 times daily 9/5- Pt reports pain bothering him this AM- but hasn't had pain meds- asked nursing to get-  9/12- just sore- pt reports improvement 4. Mood/Behavior/Sleep: Provide emotional support             -antipsychotic agents: N/A 5. Neuropsych/cognition: This  patient is capable of making decisions on his own behalf. 6. Skin/Wound Care: Routine skin checks  9/8- bruising on feet- will monitor  9/12 back incision appears to have opened up a little with fibronecrotic debris centrally. Doesn't appear infected.    -place medihoney on necrotic tissue with dry dressing over incision, remove residual steristrips from top   -contact Dr. Onetha to see if he wants to do anything different 7. Fluids/Electrolytes/Nutrition: Routine in and outs with follow-up chemistries. Continue K supplementation, vitamins, and protonix   40mg  daily 8.  Acute blood loss anemia.  Follow-up CBC. Hgb stable 9.1 on 9/4 9.  Shock/narrow complex tachycardia with hypotension.  Resolved.  Follow-up cardiology services 10.  Chronic systolic congestive heart failure/nonischemic CMP.  Demadex  20 mg daily, Aldactone  25 mg daily, Farxiga  10 mg daily.  Monitor for any fluid overload. Daily weights  9/4- Last weight yesterday 75 kg- will ask nursing to get weight daily  9/5- Weight 75.4 kg- stable  -09/14/23 wt stable today. Monitor.   9/8- Weight over all stable   9/9- weight stable but Cr 1.67- will decrease Torsemide  to 10 mg daily for now- 9/11-12 weights holding around 72kg--continue to monitor Filed Weights   09/17/23 0533 09/18/23 0513 09/19/23 0545  Weight: 76 kg 72.7 kg 71.9 kg    11.  CAD.  Toprol -XL 12.5 mg BID.  Monitor with increased mobility 12.  Atrial fibrillation.  Transitioning from IV heparin  back to Xarelto .  Continue amiodarone  200 mg twice daily for now.  Continue metoprolol   9/8-9/10- rate controlled 13.  CKD stage IIIA.  Follow-up chemistries.  Avoid nephrotoxic medications.  9/4- pt's Cr 1.30- is still in baseline range- con't to monitor  9/8- Cr 1.67 up from 1.30 and prior 1.01- and BUN up to 26- from 13- with CHF, don't want to hold meds or give IVFs- if doesn't improve with pushing fluids today- will hold his diuretics tomorrow  9/9- same numbers- on BUN/Cr- will  decrease Torsemide  to 10 mg daily as well as give 500ml of NS IVFs- to tank him up slightly  9/10- Cr down to 1.39 and BUN stable at 25- better- will recheck in AM- since have also reduced Torsemide - also monitor weight.   9/11-12 Cr holding around 1.4, continue to encourage fluids, no changes to meds   -recheck labs Monday 14.  Hyperlipidemia.  Crestor  10mg  daily 15.  History of prostate cancer.  Flomax  0.4 mg daily.- per PA, was 2022- pt said doesn't remember when but more than 3 years ago. 16.  Constipation.  MiraLAX  twice daily             - LBM 9/2, continent 9/12- LBM  17.R foot drop-chronic  9/4- from Hip surgery in January- went over with pt just the hip fx could have caused the R foot drop- we can do E stim- since has been since 2017 since prostate CA -9/12 new AFO delivered yesterday 18. Leukocytosis  9/8- WBC 11k- don't see reason- d/w nursing- incision looks stable- no URI/UTI Sx's- will recheck in AM  9/9- WBC down to 9.6- so is fine- per nursing and picture, incision slightly localized erythema, but ok  9/11 wbc's normal, see wound discussion above   19. Ear wax. Discussed that it has  nothing to do with his back/neuro dx.   - debrox for 3 days from 9/11   LOS: 9 days A FACE TO FACE EVALUATION WAS PERFORMED  Arthea ONEIDA Gunther 09/19/2023, 10:47 AM

## 2023-09-19 NOTE — Progress Notes (Signed)
 Physical Therapy Session Note  Patient Details  Name: Aaron Richmond MRN: [id number] Date of Birth: 1945-08-07  Today's Date: 09/19/2023 PT Individual Time: 0805-0900 PT Individual Time Calculation (min): 55 min   Short Term Goals: Week 1:  PT Short Term Goal 1 (Week 1): Pt will complete 3 consecutive sit to stand transfers w/ CGA PT Short Term Goal 2 (Week 1): Pt will perform 4 stairs w/ one handrail and side-stepping w/ minA PT Short Term Goal 3 (Week 1): Pt will demonstrate proper technique for sit to stand, using BUE to push off WC and not RW, 3 consecutive transfers in a row.  Skilled Therapeutic Interventions/Progress Updates: Pt presented in bed agreeable to therapy. Pt states pain 5/10, premedicated. Pt completed bed mobility with supervision and use of bed features. PTA donned TED hose and shoes/AFO with total A. Pt provided with LSO and donned with set up. Discussed bed height and pt noted bed at home is significantly elevated, PTA raised to near bed height and pt stood with CGA and ambulated to sink. Performed oral hygiene and washed face with supervision with pt noting to hold onto sink with single UE support. Pt sat in w/c and transported to day room. Completed ambulatory transfer to high/low mat and performed Sit to stand x 5 with LLE on 2in step for increased RLE recruitment. Pt then worked on Sit to stand from elevated mat without AD, requiring minA. Pt requiring most assistance for facilitation of anterior weight shifting. With fatigue pt required increased assistance for powering up as well. Pt then ambulated to NuStep and participated in NuStep L4 x 12 min for general conditioning. Pt avg approx 30-35 SPM. Completed ambulatory transfer to w/c and transported back to room. Pt remained in w/c at end of session and left with call bell within reach and needs met.      Therapy Documentation Precautions:  Precautions Precautions: Fall, Back Recall of Precautions/Restrictions:  Intact Required Braces or Orthoses: Spinal Brace, Other Brace Spinal Brace: Lumbar corset Other Brace: AFO RLE Restrictions Weight Bearing Restrictions Per Provider Order: No General:   Vital Signs: Therapy Vitals Temp: 97.7 F (36.5 C) Temp Source: Oral Pulse Rate: 73 Resp: 18 BP: (!) 117/43 Patient Position (if appropriate): Sitting Oxygen Therapy SpO2: 95 % O2 Device: Room Air Pain: Pain Assessment Pain Scale: 0-10 Pain Score: 4  Pain Location: Back Pain Intervention(s): Medication (See eMAR)   Therapy/Group: Individual Therapy  Delawrence Fridman 09/19/2023, 4:02 PM

## 2023-09-19 NOTE — Progress Notes (Signed)
 Physical Therapy Session Note  Patient Details  Name: Aaron Richmond MRN: [id number] Date of Birth: 1945/01/21  Today's Date: 09/19/2023 PT Individual Time: 1400-1430 PT Individual Time Calculation (min): 30 min   Short Term Goals: Week 1:  PT Short Term Goal 1 (Week 1): Pt will complete 3 consecutive sit to stand transfers w/ CGA PT Short Term Goal 2 (Week 1): Pt will perform 4 stairs w/ one handrail and side-stepping w/ minA PT Short Term Goal 3 (Week 1): Pt will demonstrate proper technique for sit to stand, using BUE to push off WC and not RW, 3 consecutive transfers in a row.  Skilled Therapeutic Interventions/Progress Updates:   Pt upright in WC, agreeable to therapy. Pt reported some soreness in low back, stated the brace must be rubbing against the incision, so they put a bandage on it. Pt wheeled to [address] [place] hallway for ambulation to improve pt affect as it has greater amounts of natural light. Pt ambulated ~360ft x2 to improve functional activity tolerance, gait mechanics, and decrease caregiver burden. Pt required 1 seated rest break. Pt required RW and SBA to CGA. Pt demonstrated improved upright posture during gait, improved R foot clearance, no instances of feet/toes catching on floor. Pt had reported feeling tired earlier, warranting the CGA temporarily during gait, but SBA was appropriate. Pt requesting to trial his cane during gait on Monday. Pt wheeled back to room and discussion continued and therapist agreed to trial it Monday. Pt educated on therapy process of IPR and OP PT and respective goals of each setting. Pt understood and would like to trial cane. Therapist reiterated that pt will require PT approval to switch to cane. At end of session, pt remained upright in [facility] w/ all needs in reach. Pt wife [name] in room.   Therapy Documentation Precautions:  Precautions Precautions: Fall, Back Recall of Precautions/Restrictions: Intact Required Braces or Orthoses: Spinal  Brace, Other Brace Spinal Brace: Lumbar corset Other Brace: AFO RLE Restrictions Weight Bearing Restrictions Per Provider Order: No   Therapy/Group: Individual Therapy  Aaron Richmond 09/19/2023, 3:50 PM

## 2023-09-19 NOTE — Progress Notes (Signed)
 Noted increased redness and Dehisced area to back incision. No change in pain level. Dr. Babs and Rolan, PA made aware. WOC team consulted for recommendations.   Geralynn LELON Earl, RN

## 2023-09-19 NOTE — Progress Notes (Signed)
 Patient with increasing erythema and some superficial dehiscence of his surgical wound.  Back pain not increasing.  No new neurologic deficits.  Recommend topical treatment as ordered by the wound nurse.  Would recommend starting doxycycline  for treatment of some surrounding cellulitis.  Continue to monitor for signs of deeper infection.  No indication for surgical revision at this point.

## 2023-09-20 DIAGNOSIS — T8130XA Disruption of wound, unspecified, initial encounter: Secondary | ICD-10-CM

## 2023-09-20 NOTE — Progress Notes (Signed)
 PROGRESS NOTE   Subjective/Complaints:  Pt doing well, slept well, pain well managed, LBM just now, urinating fine, no other complaints or concerns.   ROS: as per HPI. Denies CP, SOB, abd pain, N/V/D/C, or any other complaints at this time.     Objective:   No results found.  Recent Labs    09/18/23 0439  WBC 9.4  HGB 9.6*  HCT 29.3*  PLT 533*    Recent Labs    09/18/23 0439  NA 136  K 4.3  CL 101  CO2 21*  GLUCOSE 99  BUN 22  CREATININE 1.43*  CALCIUM  9.1     Intake/Output Summary (Last 24 hours) at 09/20/2023 1228 Last data filed at 09/20/2023 0904 Gross per 24 hour  Intake 480 ml  Output 525 ml  Net -45 ml        Physical Exam: Vital Signs Blood pressure (!) 117/48, pulse 73, temperature 98.7 F (37.1 C), temperature source Oral, resp. rate 16, height 5' 11 (1.803 m), weight 71.8 kg, SpO2 95%.      Constitutional: No distress . Vital signs reviewed. Ambulating with walker in hall, chronic R foot drop.  HEENT: NCAT, EOMI, oral membranes moist Neck: supple Cardiovascular: RRR without murmur. No JVD    Respiratory/Chest: CTA Bilaterally without wheezes or rales. Normal effort    GI/Abdomen: BS +, non-tender, non-distended, soft Ext: no clubbing, cyanosis, or edema Psych: pleasant and cooperative  Extremities; no LE edema, R foot drop  Skin: Bruising on b/l UE,  foam dressing both feet.    PRIOR EXAMS: 09/16/23 image of back   09/19/23 image of back incision    Neurological:  Alert and oriented x 3. Normal insight and awareness. Intact Memory. Normal language and speech. Cranial nerve exam unremarkable. MMT:  MOTOR: RUE: 4+/5 Deltoid,4+/5 Biceps, 4+/5 Triceps,4+/5 Grip LUE: 4+/5 Deltoid, 4+/5 Biceps, 4+/5 Triceps, 4+/5 Grip RLE: HF 4-/5, KE 4+/5, ADF 0/5, APF 4/5 LLE: HF 4-/5, KE 4+/5, ADF 4/5, APF 4/5      REFLEXES: NO ankle clonus   SENSORY: Normal to touch all 4 extremities    Coordination: No UE ataxia/dysmetria noted   Picc R arm, IV L arm   Assessment/Plan: 1. Functional deficits which require 3+ hours per day of interdisciplinary therapy in a comprehensive inpatient rehab setting. Physiatrist is providing close team supervision and 24 hour management of active medical problems listed below. Physiatrist and rehab team continue to assess barriers to discharge/monitor patient progress toward functional and medical goals  Care Tool:  Bathing    Body parts bathed by patient: Right arm, Left arm, Chest, Abdomen, Front perineal area, Buttocks, Right upper leg, Left upper leg, Right lower leg, Left lower leg, Face   Body parts bathed by helper: Right lower leg, Left lower leg     Bathing assist Assist Level: Contact Guard/Touching assist     Upper Body Dressing/Undressing Upper body dressing   What is the patient wearing?: Pull over shirt, Orthosis Orthosis activity level: Performed by patient  Upper body assist Assist Level: Supervision/Verbal cueing    Lower Body Dressing/Undressing Lower body dressing      What is the patient wearing?: Pants  Lower body assist Assist for lower body dressing: Contact Guard/Touching assist     Toileting Toileting    Toileting assist Assist for toileting: Contact Guard/Touching assist     Transfers Chair/bed transfer  Transfers assist     Chair/bed transfer assist level: Contact Guard/Touching assist     Locomotion Ambulation   Ambulation assist      Assist level: Contact Guard/Touching assist Assistive device: Walker-rolling Max distance: 130   Walk 10 feet activity   Assist     Assist level: Contact Guard/Touching assist Assistive device: Walker-rolling   Walk 50 feet activity   Assist    Assist level: Contact Guard/Touching assist Assistive device: Walker-rolling    Walk 150 feet activity   Assist Walk 150 feet activity did not occur: Safety/medical  concerns  Assist level: Contact Guard/Touching assist Assistive device: Walker-rolling    Walk 10 feet on uneven surface  activity   Assist Walk 10 feet on uneven surfaces activity did not occur: Safety/medical concerns         Wheelchair     Assist Is the patient using a wheelchair?: Yes Type of Wheelchair: Manual    Wheelchair assist level: Supervision/Verbal cueing Max wheelchair distance: 150    Wheelchair 50 feet with 2 turns activity    Assist        Assist Level: Contact Guard/Touching assist   Wheelchair 150 feet activity     Assist      Assist Level: Contact Guard/Touching assist   Blood pressure (!) 117/48, pulse 73, temperature 98.7 F (37.1 C), temperature source Oral, resp. rate 16, height 5' 11 (1.803 m), weight 71.8 kg, SpO2 95%.  Medical Problem List and Plan: 1. Functional deficits secondary to L1 burst fracture- with traumatic incomplete ASIA D paraplegia with cord compression status post T10-L4 fusion with transpedicular decompression L1.  Lumbar corset when out of bed applied in sitting position             -patient may shower             -ELOS/Goals: 09/24/23, PT/OT mina/sup            Main limitation- LE weakness- can do Estim for R foot drop- has been >2 years since prostate CA -received AFO 9/11 2.  Antithrombotics: -DVT/anticoagulation:  Pharmaceutical: Heparin  transitioning back to Xarelto  9/4- Xarelto  20mg  daily to start today             -antiplatelet therapy: N/A 3. Pain Management: Tramadol /hydrocodone  as needed, Robaxin  500 mg 4 times daily 9/5- Pt reports pain bothering him this AM- but hasn't had pain meds- asked nursing to get-  9/12- just sore- pt reports improvement 4. Mood/Behavior/Sleep: Provide emotional support             -antipsychotic agents: N/A 5. Neuropsych/cognition: This patient is capable of making decisions on his own behalf. 6. Skin/Wound Care: Routine skin checks  9/8- bruising on feet- will  monitor  9/12 back incision appears to have opened up a little with fibronecrotic debris centrally. Doesn't appear infected.    -place medihoney on necrotic tissue with dry dressing over incision, remove residual steristrips from top   -contact Dr. Onetha to see if he wants to do anything different -09/20/23 see note from Dr. Louis, states topical treatment as per WOC, start doxycycline  100mg  BID x7d for surrounding cellulitis (ordered by them), monitor 7. Fluids/Electrolytes/Nutrition: Routine in and outs with follow-up chemistries. Continue K supplementation, vitamins, and protonix  40mg  daily 8.  Acute blood  loss anemia.  Follow-up CBC. Hgb stable on labs 9.  Shock/narrow complex tachycardia with hypotension.  Resolved.  Follow-up cardiology services 10.  Chronic systolic congestive heart failure/nonischemic CMP.  Demadex  20 mg daily, Aldactone  25 mg daily, Farxiga  10 mg daily.  Monitor for any fluid overload. Daily weights  9/4- Last weight yesterday 75 kg- will ask nursing to get weight daily  9/5- Weight 75.4 kg- stable  -09/14/23 wt stable today. Monitor.   9/8- Weight over all stable   9/9- weight stable but Cr 1.67- will decrease Torsemide  to 10 mg daily for now- 9/11-13 weights holding around 72kg--continue to monitor Filed Weights   09/18/23 0513 09/19/23 0545 09/20/23 0456  Weight: 72.7 kg 71.9 kg 71.8 kg    11.  CAD.  Toprol -XL 12.5 mg BID.  Monitor with increased mobility 12.  Atrial fibrillation.  Transitioning from IV heparin  back to Xarelto .  Continue amiodarone  200 mg twice daily for now.  Continue metoprolol   - rate controlled 13.  CKD stage IIIA.  Follow-up chemistries.  Avoid nephrotoxic medications.  9/4- pt's Cr 1.30- is still in baseline range- con't to monitor  9/8- Cr 1.67 up from 1.30 and prior 1.01- and BUN up to 26- from 13- with CHF, don't want to hold meds or give IVFs- if doesn't improve with pushing fluids today- will hold his diuretics tomorrow  9/9- same numbers-  on BUN/Cr- will decrease Torsemide  to 10 mg daily as well as give 500ml of NS IVFs- to tank him up slightly  9/10- Cr down to 1.39 and BUN stable at 25- better- will recheck in AM- since have also reduced Torsemide - also monitor weight.   9/11-12 Cr holding around 1.4, continue to encourage fluids, no changes to meds   -recheck labs Monday 14.  Hyperlipidemia.  Crestor  10mg  daily 15.  History of prostate cancer.  Flomax  0.4 mg daily.- per PA, was 2022- pt said doesn't remember when but more than 3 years ago. 16.  Constipation.  MiraLAX  twice daily             - LBM 9/2, continent -LBM 9/13 17.R foot drop-chronic  9/4- from Hip surgery in January- went over with pt just the hip fx could have caused the R foot drop- we can do E stim- since has been since 2017 since prostate CA -9/12 new AFO delivered yesterday 18. Leukocytosis  9/8- WBC 11k- don't see reason- d/w nursing- incision looks stable- no URI/UTI Sx's- will recheck in AM  9/9- WBC down to 9.6- so is fine- per nursing and picture, incision slightly localized erythema, but ok  9/11 wbc's normal, see wound discussion above   19. Ear wax. Discussed that it has  nothing to do with his back/neuro dx.   - debrox for 3 days from 9/11   LOS: 10 days A FACE TO [facility] EVALUATION WAS PERFORMED  [address] 09/20/2023, 12:28 PM

## 2023-09-20 NOTE — Progress Notes (Signed)
+/-   sleep. Up to BR at 0020-large, continent BM. 2-norco given at 0424 for complaint of back pain. Dressing in place to back. Bilalteral PRAFO in place. Flat affect.Aaron Richmond A

## 2023-09-20 NOTE — Progress Notes (Signed)
 Physical Therapy Session Note  Patient Details  Name: Aaron Richmond MRN: [id number] Date of Birth: 1945-06-26  Today's Date: 09/20/2023 PT Individual Time: 0915-0955  PT Individual Time Calculation (min): 40 min  Short Term Goals: Week 1:  PT Short Term Goal 1 (Week 1): Pt will complete 3 consecutive sit to stand transfers w/ CGA PT Short Term Goal 2 (Week 1): Pt will perform 4 stairs w/ one handrail and side-stepping w/ minA PT Short Term Goal 3 (Week 1): Pt will demonstrate proper technique for sit to stand, using BUE to push off WC and not RW, 3 consecutive transfers in a row.  Skilled Therapeutic Interventions/Progress Updates:  Chart reviewed and pt agreeable to therapy. Pt received semi-reclined in bed with 4/10 c/o pain in back. Session focused on bed mobility, functional transfers, and ambulation to promote safe home mobility and access. Pt initiated session with transfer to EOB using S. Pt then completed sit to stand and amb to toilet using S + RW. Pt required S for pericare. Pt then sttod at sink for oral car using S + RW. Pt then amb 42ft + 153ft using CGA + RW fading to S + RW.  At end of session, pt was left seated in [facility] with nurse call bell, permission from RN to not use lap belt or seat alarm, and all needs in reach.     Therapy Documentation Precautions:  Precautions Precautions: Fall, Back Recall of Precautions/Restrictions: Intact Required Braces or Orthoses: Spinal Brace, Other Brace Spinal Brace: Lumbar corset Other Brace: AFO RLE Restrictions Weight Bearing Restrictions Per Provider Order: No General:      Therapy/Group: Individual Therapy   [name] 09/20/2023, 12:19 PM

## 2023-09-21 ENCOUNTER — Other Ambulatory Visit: Payer: Self-pay | Admitting: Internal Medicine

## 2023-09-21 NOTE — Progress Notes (Signed)
 PROGRESS NOTE   Subjective/Complaints:  Pt doing well again, slept great, pain well controlled, LBM yesterday, urinating fine, no other complaints or concerns.   ROS: as per HPI. Denies CP, SOB, abd pain, N/V/D/C, or any other complaints at this time.     Objective:   No results found.  No results for input(s): WBC, HGB, HCT, PLT in the last 72 hours.   No results for input(s): NA, K, CL, CO2, GLUCOSE, BUN, CREATININE, CALCIUM  in the last 72 hours.    Intake/Output Summary (Last 24 hours) at 09/21/2023 1122 Last data filed at 09/21/2023 0953 Gross per 24 hour  Intake 1578 ml  Output 1025 ml  Net 553 ml        Physical Exam: Vital Signs Blood pressure (!) 133/45, pulse 73, temperature (!) 97.4 F (36.3 C), temperature source Oral, resp. rate 16, height 5' 11 (1.803 m), weight 71.9 kg, SpO2 93%.   Constitutional: No distress . Vital signs reviewed. Sitting up in w/c HEENT: NCAT, EOMI, oral membranes moist Neck: supple Cardiovascular: RRR without murmur. No JVD    Respiratory/Chest: CTA Bilaterally without wheezes or rales. Normal effort    GI/Abdomen: BS +, non-tender, non-distended, soft Ext: no clubbing, cyanosis, or edema Psych: pleasant and cooperative  Extremities; no LE edema, R foot drop  Skin: Bruising on b/l UE,  foam dressing both feet.    PRIOR EXAMS: 09/16/23 image of back   09/19/23 image of back incision    Neurological:  Alert and oriented x 3. Normal insight and awareness. Intact Memory. Normal language and speech. Cranial nerve exam unremarkable. MMT:  MOTOR: RUE: 4+/5 Deltoid,4+/5 Biceps, 4+/5 Triceps,4+/5 Grip LUE: 4+/5 Deltoid, 4+/5 Biceps, 4+/5 Triceps, 4+/5 Grip RLE: HF 4-/5, KE 4+/5, ADF 0/5, APF 4/5 LLE: HF 4-/5, KE 4+/5, ADF 4/5, APF 4/5      REFLEXES: NO ankle clonus   SENSORY: Normal to touch all 4 extremities   Coordination: No UE ataxia/dysmetria  noted   Picc R arm, IV L arm   Assessment/Plan: 1. Functional deficits which require 3+ hours per day of interdisciplinary therapy in a comprehensive inpatient rehab setting. Physiatrist is providing close team supervision and 24 hour management of active medical problems listed below. Physiatrist and rehab team continue to assess barriers to discharge/monitor patient progress toward functional and medical goals  Care Tool:  Bathing    Body parts bathed by patient: Right arm, Left arm, Chest, Abdomen, Front perineal area, Buttocks, Right upper leg, Left upper leg, Right lower leg, Left lower leg, Face   Body parts bathed by helper: Right lower leg, Left lower leg     Bathing assist Assist Level: Contact Guard/Touching assist     Upper Body Dressing/Undressing Upper body dressing   What is the patient wearing?: Pull over shirt, Orthosis Orthosis activity level: Performed by patient  Upper body assist Assist Level: Supervision/Verbal cueing    Lower Body Dressing/Undressing Lower body dressing      What is the patient wearing?: Pants     Lower body assist Assist for lower body dressing: Contact Guard/Touching assist     Toileting Toileting    Toileting assist Assist for toileting: Contact  Guard/Touching assist     Transfers Chair/bed transfer  Transfers assist     Chair/bed transfer assist level: Contact Guard/Touching assist     Locomotion Ambulation   Ambulation assist      Assist level: Contact Guard/Touching assist Assistive device: Walker-rolling Max distance: 130   Walk 10 feet activity   Assist     Assist level: Contact Guard/Touching assist Assistive device: Walker-rolling   Walk 50 feet activity   Assist    Assist level: Contact Guard/Touching assist Assistive device: Walker-rolling    Walk 150 feet activity   Assist Walk 150 feet activity did not occur: Safety/medical concerns  Assist level: Contact Guard/Touching  assist Assistive device: Walker-rolling    Walk 10 feet on uneven surface  activity   Assist Walk 10 feet on uneven surfaces activity did not occur: Safety/medical concerns         Wheelchair     Assist Is the patient using a wheelchair?: Yes Type of Wheelchair: Manual    Wheelchair assist level: Supervision/Verbal cueing Max wheelchair distance: 150    Wheelchair 50 feet with 2 turns activity    Assist        Assist Level: Contact Guard/Touching assist   Wheelchair 150 feet activity     Assist      Assist Level: Contact Guard/Touching assist   Blood pressure (!) 133/45, pulse 73, temperature (!) 97.4 F (36.3 C), temperature source Oral, resp. rate 16, height 5' 11 (1.803 m), weight 71.9 kg, SpO2 93%.  Medical Problem List and Plan: 1. Functional deficits secondary to L1 burst fracture- with traumatic incomplete ASIA D paraplegia with cord compression status post T10-L4 fusion with transpedicular decompression L1.  Lumbar corset when out of bed applied in sitting position             -patient may shower             -ELOS/Goals: 09/24/23, PT/OT mina/sup            Main limitation- LE weakness- can do Estim for R foot drop- has been >2 years since prostate CA -received AFO 9/11 2.  Antithrombotics: -DVT/anticoagulation:  Pharmaceutical: Heparin  transitioning back to Xarelto  9/4- Xarelto  20mg  daily to start today             -antiplatelet therapy: N/A 3. Pain Management: Tramadol /hydrocodone  as needed, Robaxin  500 mg 4 times daily 9/5- Pt reports pain bothering him this AM- but hasn't had pain meds- asked nursing to get-  9/12- just sore- pt reports improvement 4. Mood/Behavior/Sleep: Provide emotional support             -antipsychotic agents: N/A 5. Neuropsych/cognition: This patient is capable of making decisions on his own behalf. 6. Skin/Wound Care: Routine skin checks  9/8- bruising on feet- will monitor  9/12 back incision appears to have  opened up a little with fibronecrotic debris centrally. Doesn't appear infected.    -place medihoney on necrotic tissue with dry dressing over incision, remove residual steristrips from top   -contact Dr. Onetha to see if he wants to do anything different -09/20/23 see note from Dr. Louis, states topical treatment as per WOC, start doxycycline  100mg  BID x7d for surrounding cellulitis (ordered by them), monitor 7. Fluids/Electrolytes/Nutrition: Routine in and outs with follow-up chemistries. Continue K supplementation, vitamins, and protonix  40mg  daily 8.  Acute blood loss anemia.  Follow-up CBC. Hgb stable on labs 9.  Shock/narrow complex tachycardia with hypotension.  Resolved.  Follow-up cardiology services 10.  Chronic  systolic congestive heart failure/nonischemic CMP.  Demadex  20 mg daily, Aldactone  25 mg daily, Farxiga  10 mg daily.  Monitor for any fluid overload. Daily weights  9/4- Last weight yesterday 75 kg- will ask nursing to get weight daily  9/5- Weight 75.4 kg- stable  -09/14/23 wt stable today. Monitor.   9/8- Weight over all stable   9/9- weight stable but Cr 1.67- will decrease Torsemide  to 10 mg daily for now- 9/11-13 weights holding around 72kg--continue to monitor Filed Weights   09/19/23 0545 09/20/23 0456 09/21/23 0447  Weight: 71.9 kg 71.8 kg 71.9 kg    11.  CAD.  Toprol -XL 12.5 mg BID.  Monitor with increased mobility 12.  Atrial fibrillation.  Transitioning from IV heparin  back to Xarelto .  Continue amiodarone  200 mg twice daily for now.  Continue metoprolol   - rate controlled 13.  CKD stage IIIA.  Follow-up chemistries.  Avoid nephrotoxic medications.  9/4- pt's Cr 1.30- is still in baseline range- con't to monitor  9/8- Cr 1.67 up from 1.30 and prior 1.01- and BUN up to 26- from 13- with CHF, don't want to hold meds or give IVFs- if doesn't improve with pushing fluids today- will hold his diuretics tomorrow  9/9- same numbers- on BUN/Cr- will decrease Torsemide  to 10 mg  daily as well as give 500ml of NS IVFs- to tank him up slightly  9/10- Cr down to 1.39 and BUN stable at 25- better- will recheck in AM- since have also reduced Torsemide - also monitor weight.   9/11-12 Cr holding around 1.4, continue to encourage fluids, no changes to meds   -recheck labs Monday 14.  Hyperlipidemia.  Crestor  10mg  daily 15.  History of prostate cancer.  Flomax  0.4 mg daily.- per PA, was 2022- pt said doesn't remember when but more than 3 years ago. 16.  Constipation.  MiraLAX  twice daily             - LBM 9/2, continent -LBM 9/14 per pt 17.R foot drop-chronic  9/4- from Hip surgery in January- went over with pt just the hip fx could have caused the R foot drop- we can do E stim- since has been since 2017 since prostate CA -9/12 new AFO delivered yesterday 18. Leukocytosis  9/8- WBC 11k- don't see reason- d/w nursing- incision looks stable- no URI/UTI Sx's- will recheck in AM  9/9- WBC down to 9.6- so is fine- per nursing and picture, incision slightly localized erythema, but ok  9/11 wbc's normal, see wound discussion above   19. Ear wax. Discussed that it has  nothing to do with his back/neuro dx.   - debrox for 3 days from 9/11   LOS: 11 days A FACE TO FACE EVALUATION WAS PERFORMED  [address] 09/21/2023, 11:22 AM

## 2023-09-21 NOTE — Progress Notes (Signed)
 2 PRN norco given at 0642 for complaint of back pain. Rating pain a 7 on pain scale. Usually takes Pain meds one time on my shift. Asked if he thought 1 tab would manage his pain, I'm entitled to 2. Educated and reminded him about keeping heels elevated and turning while in bed. Elliot Meldrum A

## 2023-09-21 NOTE — Consult Note (Signed)
 WOC Nurse Consult Note:patient with DTPI in media 09/10/2023; has evolved to unstageable  Reason for Consult: R heel unstageable  Wound type: R heel unstageable 100% brown dry necrotic  Pressure Injury POA: Yes Measurement: see nursing flowsheet  Wound bed: 100% brown dry Drainage (amount, consistency, odor) see nursing flowsheet  Periwound: maroon discoloration surrounding tissue  Dressing procedure/placement/frequency: Cleanse R heel wound with Vashe Soila [phone]) do not rinse and allow to air dry. Apply Xeroform gauze (Lawson [phone]) to wound bed daily and secure with silicone foam or dry gauze and Kerlix roll gauze.  Place R foot in Prevalon boot to offload pressure. Gerlean [phone]  POC discussed with bedside nurse. WOC team will not follow. Re-consult if further needs arise.   Thank you,    Powell Bar MSN, RN-BC, [employer]

## 2023-09-21 NOTE — Progress Notes (Signed)
 Right heel with DTI. Foam dressing in place. Prevalon boots applied. Refusing to turn. Continent using urinal. Dressing in place to back incision, dressing recently changed. Aaron Richmond A

## 2023-09-21 NOTE — Progress Notes (Signed)
 Physical Therapy Session Note  Patient Details  Name: Aaron Richmond MRN: [id number] Date of Birth: 20-Sep-1945  Today's Date: 09/21/2023 PT Individual Time: 1450-1535 PT Individual Time Calculation (min): 45 min   Short Term Goals: Week 1:  PT Short Term Goal 1 (Week 1): Pt will complete 3 consecutive sit to stand transfers w/ CGA PT Short Term Goal 2 (Week 1): Pt will perform 4 stairs w/ one handrail and side-stepping w/ minA PT Short Term Goal 3 (Week 1): Pt will demonstrate proper technique for sit to stand, using BUE to push off WC and not RW, 3 consecutive transfers in a row.  Skilled Therapeutic Interventions/Progress Updates: Pt presented in recliner agreeable to therapy. Pt states increased pain/soreness in back, premedicated with rest and repositioning provided during session. Pt noted to not have TED hose or shoes on. PTA donned total A. Pt then ambulated to day room with RW and CGA. Pt participated in standing balance/tolerance playing corn hold while standing on Airex. Pt noted to have x 1 LOB anteriorly when throwing bag but was able to correct with cues. Pt then ambulated and with use of reacher was able to pick up bags. Pt returned to mat and after seated rest ambulated back to room. Pt left in w/c at end of session with call bell within reach and needs met.      Therapy Documentation Precautions:  Precautions Precautions: Fall, Back Recall of Precautions/Restrictions: Intact Required Braces or Orthoses: Spinal Brace, Other Brace Spinal Brace: Lumbar corset Other Brace: AFO RLE Restrictions Weight Bearing Restrictions Per Provider Order: No General:   Vital Signs: Therapy Vitals Temp: 98.7 F (37.1 C) Temp Source: Oral Pulse Rate: 79 Resp: 16 BP: (!) 101/43 Patient Position (if appropriate): Sitting Oxygen Therapy SpO2: 97 % O2 Device: Room Air Pain: Pain Assessment Pain Scale: 0-10 Pain Score: 6  Pain Location: Back Pain Intervention(s): Medication (See  eMAR) Mobility:   Locomotion :    Trunk/Postural Assessment :    Balance:   Exercises:   Other Treatments:      Therapy/Group: Individual Therapy  [name] 09/21/2023, 4:34 PM

## 2023-09-22 LAB — BASIC METABOLIC PANEL WITH GFR
Anion gap: 14 (ref 5–15)
BUN: 26 mg/dL — ABNORMAL HIGH (ref 8–23)
CO2: 22 mmol/L (ref 22–32)
Calcium: 9.3 mg/dL (ref 8.9–10.3)
Chloride: 103 mmol/L (ref 98–111)
Creatinine, Ser: 1.46 mg/dL — ABNORMAL HIGH (ref 0.61–1.24)
GFR, Estimated: 49 mL/min — ABNORMAL LOW
Glucose, Bld: 101 mg/dL — ABNORMAL HIGH (ref 70–99)
Potassium: 4.4 mmol/L (ref 3.5–5.1)
Sodium: 139 mmol/L (ref 135–145)

## 2023-09-22 LAB — CBC
HCT: 28.9 % — ABNORMAL LOW (ref 39.0–52.0)
Hemoglobin: 9.3 g/dL — ABNORMAL LOW (ref 13.0–17.0)
MCH: 30.4 pg (ref 26.0–34.0)
MCHC: 32.2 g/dL (ref 30.0–36.0)
MCV: 94.4 fL (ref 80.0–100.0)
Platelets: 532 K/uL — ABNORMAL HIGH (ref 150–400)
RBC: 3.06 MIL/uL — ABNORMAL LOW (ref 4.22–5.81)
RDW: 14.6 % (ref 11.5–15.5)
WBC: 9 K/uL (ref 4.0–10.5)
nRBC: 0 % (ref 0.0–0.2)

## 2023-09-22 NOTE — Progress Notes (Signed)
 PROGRESS NOTE   Subjective/Complaints:  No specific issues this morning. Pain seems controlled. Sleeping at night. Denies fever or chills  ROS: Patient denies fever, rash, sore throat, blurred vision, dizziness, nausea, vomiting, diarrhea, cough, shortness of breath or chest pain,  headache, or mood change.   Objective:   No results found.  Recent Labs    09/22/23 0549  WBC 9.0  HGB 9.3*  HCT 28.9*  PLT 532*     Recent Labs    09/22/23 0549  NA 139  K 4.4  CL 103  CO2 22  GLUCOSE 101*  BUN 26*  CREATININE 1.46*  CALCIUM  9.3      Intake/Output Summary (Last 24 hours) at 09/22/2023 1401 Last data filed at 09/22/2023 1238 Gross per 24 hour  Intake 976 ml  Output 1200 ml  Net -224 ml        Physical Exam: Vital Signs Blood pressure (!) 116/49, pulse 73, temperature 98.7 F (37.1 C), temperature source Oral, resp. rate 16, height 5' 11 (1.803 m), weight 72.3 kg, SpO2 97%.   Constitutional: No distress . Vital signs reviewed. HEENT: NCAT, EOMI, oral membranes moist Neck: supple Cardiovascular: RRR without murmur. No JVD    Respiratory/Chest: CTA Bilaterally without wheezes or rales. Normal effort    GI/Abdomen: BS +, non-tender, non-distended Ext: no clubbing, cyanosis, or edema Psych: pleasant and cooperative  Extremities; no LE edema, R foot drop  Skin: Bruising on b/l UE,  foam dressing both heels    PRIOR EXAMS: 09/16/23 image of back   09/19/23 image of back incision  9/15 wound with less fibronecrotic tissue, mild erythema still   Neurological:  Alert and oriented x 3. Normal insight and awareness. Intact Memory. Normal language and speech. Cranial nerve exam unremarkable. MMT:  MOTOR: RUE: 4+/5 Deltoid,4+/5 Biceps, 4+/5 Triceps,4+/5 Grip LUE: 4+/5 Deltoid, 4+/5 Biceps, 4+/5 Triceps, 4+/5 Grip RLE: HF 4-/5, KE 4+/5, ADF 0/5, APF 4/5 LLE: HF 4-/5, KE 4+/5, ADF 4/5, APF 4/5       REFLEXES: NO ankle clonus   SENSORY: Normal to touch all 4 extremities   Coordination: No UE ataxia/dysmetria noted     Assessment/Plan: 1. Functional deficits which require 3+ hours per day of interdisciplinary therapy in a comprehensive inpatient rehab setting. Physiatrist is providing close team supervision and 24 hour management of active medical problems listed below. Physiatrist and rehab team continue to assess barriers to discharge/monitor patient progress toward functional and medical goals  Care Tool:  Bathing    Body parts bathed by patient: Right arm, Left arm, Chest, Abdomen, Front perineal area, Buttocks, Right upper leg, Left upper leg, Right lower leg, Left lower leg, Face   Body parts bathed by helper: Right lower leg, Left lower leg     Bathing assist Assist Level: Supervision/Verbal cueing     Upper Body Dressing/Undressing Upper body dressing   What is the patient wearing?: Pull over shirt, Orthosis Orthosis activity level: Performed by patient  Upper body assist Assist Level: Supervision/Verbal cueing    Lower Body Dressing/Undressing Lower body dressing      What is the patient wearing?: Pants     Lower body assist Assist  for lower body dressing: Supervision/Verbal cueing     Toileting Toileting    Toileting assist Assist for toileting: Contact Guard/Touching assist     Transfers Chair/bed transfer  Transfers assist     Chair/bed transfer assist level: Contact Guard/Touching assist     Locomotion Ambulation   Ambulation assist      Assist level: Contact Guard/Touching assist Assistive device: Walker-rolling Max distance: 130   Walk 10 feet activity   Assist     Assist level: Contact Guard/Touching assist Assistive device: Walker-rolling   Walk 50 feet activity   Assist    Assist level: Contact Guard/Touching assist Assistive device: Walker-rolling    Walk 150 feet activity   Assist Walk 150 feet activity  did not occur: Safety/medical concerns  Assist level: Contact Guard/Touching assist Assistive device: Walker-rolling    Walk 10 feet on uneven surface  activity   Assist Walk 10 feet on uneven surfaces activity did not occur: Safety/medical concerns         Wheelchair     Assist Is the patient using a wheelchair?: Yes Type of Wheelchair: Manual    Wheelchair assist level: Supervision/Verbal cueing Max wheelchair distance: 150    Wheelchair 50 feet with 2 turns activity    Assist        Assist Level: Contact Guard/Touching assist   Wheelchair 150 feet activity     Assist      Assist Level: Contact Guard/Touching assist   Blood pressure (!) 116/49, pulse 73, temperature 98.7 F (37.1 C), temperature source Oral, resp. rate 16, height 5' 11 (1.803 m), weight 72.3 kg, SpO2 97%.  Medical Problem List and Plan: 1. Functional deficits secondary to L1 burst fracture- with traumatic incomplete ASIA D paraplegia with cord compression status post T10-L4 fusion with transpedicular decompression L1.  Lumbar corset when out of bed applied in sitting position             -patient may shower             -ELOS/Goals: 09/24/23, PT/OT mina/sup            Main limitation- LE weakness- can do Estim for R foot drop- has been >2 years since prostate CA -awaiting AFO from [facility]: today 9/15? 2.  Antithrombotics: -DVT/anticoagulation:  Pharmaceutical: Heparin  transitioning back to Xarelto  9/4- Xarelto  20mg  daily to start today             -antiplatelet therapy: N/A 3. Pain Management: Tramadol /hydrocodone  as needed, Robaxin  500 mg 4 times daily 9/5- Pt reports pain bothering him this AM- but hasn't had pain meds- asked nursing to get-  9/15- just sore- pt reports improvement 4. Mood/Behavior/Sleep: Provide emotional support             -antipsychotic agents: N/A 5. Neuropsych/cognition: This patient is capable of making decisions on his own behalf. 6. Skin/Wound Care:  Routine skin checks  9/8- bruising on feet- will monitor  9/12 back incision appears to have opened up a little with fibronecrotic debris centrally. Doesn't appear infected.    -place medihoney on necrotic tissue with dry dressing over incision, removed residual steristrips from top   -contact Dr. Onetha to see if he wants to do anything different -09/20/23 see note from Dr. Louis, states topical treatment as per WOC, start doxycycline  100mg  BID x7d for surrounding cellulitis (ordered by them), monitor 9/15 wound looks sl improved. Less fibronecrotic tissue. Wbc's 9k. No fever or new pain. Continue current wound care above. 7. Fluids/Electrolytes/Nutrition: Routine  in and outs with follow-up chemistries. Continue K supplementation, vitamins, and protonix  40mg  daily 8.  Acute blood loss anemia.  Follow-up CBC. Hgb stable on labs 9.  Shock/narrow complex tachycardia with hypotension.  Resolved.  Follow-up cardiology services 10.  Chronic systolic congestive heart failure/nonischemic CMP.  Demadex  20 mg daily, Aldactone  25 mg daily, Farxiga  10 mg daily.  Monitor for any fluid overload. Daily weights  9/4- Last weight yesterday 75 kg- will ask nursing to get weight daily  9/5- Weight 75.4 kg- stable  -09/14/23 wt stable today. Monitor.   9/8- Weight over all stable   9/9- weight stable but Cr 1.67- will decrease Torsemide  to 10 mg daily for now- 9/11-15 weights holding around 72kg--continue to monitor with 10mg  torsemide  Filed Weights   09/20/23 0456 09/21/23 0447 09/22/23 0500  Weight: 71.8 kg 71.9 kg 72.3 kg    11.  CAD.  Toprol -XL 12.5 mg BID.  Monitor with increased mobility 12.  Atrial fibrillation.  Transitioning from IV heparin  back to Xarelto .  Continue amiodarone  200 mg twice daily for now.  Continue metoprolol   - rate controlled 13.  CKD stage IIIA.  Follow-up chemistries.  Avoid nephrotoxic medications.  9/4- pt's Cr 1.30- is still in baseline range- con't to monitor  9/8- Cr 1.67 up from  1.30 and prior 1.01- and BUN up to 26- from 13- with CHF, don't want to hold meds or give IVFs- if doesn't improve with pushing fluids today- will hold his diuretics tomorrow  9/9- same numbers- on BUN/Cr- will decrease Torsemide  to 10 mg daily as well as give 500ml of NS IVFs- to tank him up slightly  9/10- Cr down to 1.39 and BUN stable at 25- better- will recheck in AM- since have also reduced Torsemide - also monitor weight.   9/15 Cr 1.45, I suspect this is near his baseline-   -continue to encourage appropriate fluid intake, same torsemide  14.  Hyperlipidemia.  Crestor  10mg  daily 15.  History of prostate cancer.  Flomax  0.4 mg daily.- per PA, was 2022- pt said doesn't remember when but more than 3 years ago. 16.  Constipation.  MiraLAX  twice daily             - pt had T3 bm this morning 9/15 17.R foot drop-chronic  9/4- from Hip surgery in January- went over with pt just the hip fx could have caused the R foot drop- we can do E stim- since has been since 2017 since prostate CA -9/15 awaiting AFO today? 18. Leukocytosis  9/8- WBC 11k- don't see reason- d/w nursing- incision looks stable- no URI/UTI Sx's- will recheck in AM  9/9- WBC down to 9.6- so is fine- per nursing and picture, incision slightly localized erythema, but ok  - wbc's normal, see wound discussion above   19. Ear wax. Discussed that it has  nothing to do with his back/neuro dx.   - debrox for 3 days from 9/11  -improved  LOS: 12 days A FACE TO FACE EVALUATION WAS PERFORMED  Arthea ONEIDA Gunther 09/22/2023, 2:01 PM

## 2023-09-22 NOTE — Plan of Care (Signed)
  Problem: Consults Goal: RH SPINAL CORD INJURY PATIENT EDUCATION Description:  See Patient Education module for education specifics.  Outcome: Progressing   Problem: SCI BOWEL ELIMINATION Goal: RH STG MANAGE BOWEL WITH ASSISTANCE Description: STG Manage Bowel with supervision Assistance. Outcome: Progressing   Problem: SCI BLADDER ELIMINATION Goal: RH STG MANAGE BLADDER WITH ASSISTANCE Description: STG Manage Bladder With supervision  Assistance Outcome: Progressing   Problem: RH SKIN INTEGRITY Goal: RH STG SKIN FREE OF INFECTION/BREAKDOWN Description: Manage skin free of infection with supervision assistance Outcome: Progressing   Problem: RH SAFETY Goal: RH STG ADHERE TO SAFETY PRECAUTIONS W/ASSISTANCE/DEVICE Description: STG Adhere to Safety Precautions With supervision Assistance/Device. Outcome: Progressing   Problem: RH PAIN MANAGEMENT Goal: RH STG PAIN MANAGED AT OR BELOW PT'S PAIN GOAL Description: < 4 w/ prns Outcome: Progressing   Problem: RH KNOWLEDGE DEFICIT SCI Goal: RH STG INCREASE KNOWLEDGE OF SELF CARE AFTER SCI Description: Manage increase knowledge of self care after SCI with supervision assistance from spouse using educational materials provided Outcome: Progressing

## 2023-09-22 NOTE — Progress Notes (Signed)
 Physical Therapy Session Note  Patient Details  Name: Aaron Richmond MRN: [id number] Date of Birth: 1945-05-01  Today's Date: 09/22/2023 PT Individual Time: 1330-1430 PT Individual Time Calculation (min): 60 min   Short Term Goals: Week 1:  PT Short Term Goal 1 (Week 1): Pt will complete 3 consecutive sit to stand transfers w/ CGA PT Short Term Goal 2 (Week 1): Pt will perform 4 stairs w/ one handrail and side-stepping w/ minA PT Short Term Goal 3 (Week 1): Pt will demonstrate proper technique for sit to stand, using BUE to push off WC and not RW, 3 consecutive transfers in a row.  Skilled Therapeutic Interventions/Progress Updates:  Pt received upright in manual wc, agreeable to therapy. Pt reports significant soreness/pain in back. Pain was unrated, treated w/ physical movement. Pt performed 4 item DGI w/ RW and scored 5/12. Plan was to perform test again w/ single point cane, but pt declined because he felt it wasn't going to be safe. Pt ambulated w/ SPC and one hand on rail in hallway outside main gym ~58ft w/ CGA-minA for safety. Pt educated on the role of OP PT and that they would address his desire to use a SPC, but more safe to use RW for now. Pt also expressed desire to drive again, was educated on the dangers of driving w/ AFO and how it would not be safe at this time. Pt also educated on the fact that an MD must approve this before pt can return to driving.  Pt ambulated ~137ft down to ortho gym to improve functional activity tolerance and decrease fall risk. Pt required RW and SBA, demonstrating improved R foot clearance due to AFO from [facility] being delivered today. Pt agreeable to wear it. Pt performed car transfer w/ SBA. Pt ambulated up ramp, across mulch, and down step/curb before returning to chair to improve community reintegration and decrease fall risk. Pt required CGA and RW for uneven surfaces and step down.   At end of session, pt used BLE to pull himself back to room to  improve BLE strength w/ SBA and occasional minA. Pt then performed several sit to stands from EOB w/ RW and minA due to lowest bed height. Finally, static stretch of B hamstrings performed, attempting to alleviate low back pain. Pt remained upright in wc w/ all needs in reach.    Therapy Documentation Precautions:  Precautions Precautions: Fall, Back Recall of Precautions/Restrictions: Intact Required Braces or Orthoses: Spinal Brace, Other Brace Spinal Brace: Lumbar corset Other Brace: AFO RLE Restrictions Weight Bearing Restrictions Per Provider Order: No   Therapy/Group: Individual Therapy  [name] 09/22/2023, 4:00 PM

## 2023-09-22 NOTE — Progress Notes (Signed)
 Uneventful night. Bilateral prevalon boots applied. Foam dressings to bilateral heels. Dry dressing to back incision. Refuses to turn during night. Reports having wound issues to right heel before, because of my foot drop. Using urinal. BM yesterday. Denies pain.Aaron Richmond

## 2023-09-22 NOTE — Progress Notes (Signed)
 Physical Therapy Session Note  Patient Details  Name: Aaron Richmond MRN: [id number] Date of Birth: May 20, 1945  Today's Date: 09/22/2023 PT Individual Time: 1030-1125  PT Individual Time Calculation (min): 55 min  Short Term Goals: Week 1:  PT Short Term Goal 1 (Week 1): Pt will complete 3 consecutive sit to stand transfers w/ CGA PT Short Term Goal 2 (Week 1): Pt will perform 4 stairs w/ one handrail and side-stepping w/ minA PT Short Term Goal 3 (Week 1): Pt will demonstrate proper technique for sit to stand, using BUE to push off WC and not RW, 3 consecutive transfers in a row.  Skilled Therapeutic Interventions/Progress Updates:  Chart reviewed and pt agreeable to therapy. Pt received seated in WC with 5/10 c/o pain in back. Also of note, LSO and AFO donned t/o session. Session focused on ambulation to promote safe home mobility and access. Pt taken to first floor for therapy session. Pt completed block practice amb of 300-459ft on uneven outdoors surface using CGA + RW fading to close S + RW. Pt noted to have good control of DME. Pt then completed balance practice of standing in place with no UE support usingg S. Pt comfortable with neutral stance for 15-30secs per round before feeling need to use RW for balance, indicating continued fall risk.. Session education emphasized balance training across the lifespan. At end of session, pt was left seated in [facility] with alarm engaged, nurse call bell and all needs in reach.     Therapy Documentation Precautions:  Precautions Precautions: Fall, Back Recall of Precautions/Restrictions: Intact Required Braces or Orthoses: Spinal Brace, Other Brace Spinal Brace: Lumbar corset Other Brace: AFO RLE Restrictions Weight Bearing Restrictions Per Provider Order: No General:      Therapy/Group: Individual Therapy   [name] 09/22/2023, 12:19 PM

## 2023-09-22 NOTE — Progress Notes (Incomplete)
 Patient refused miralax  and suppository. Patient was educated to medication use and need for medication related to pain medication use. PA notified. Fredie LITTIE Morones, RN

## 2023-09-22 NOTE — Progress Notes (Signed)
 Occupational Therapy Session Note  Patient Details  Name: Aaron Richmond MRN: [id number] Date of Birth: [birth year]  Today's Date: 09/22/2023 OT Individual Time: 9184-9069 OT Individual Time Calculation (min): 75 min    Short Term Goals: Week 2:  OT Short Term Goal 1 (Week 2): See LTG's 2/2 length of stay  Skilled Therapeutic Interventions/Progress Updates:    Pt resting in bed upon arrival. Skilled OT intervention with focus on bed mobility, sit<>stand, standing balance, functional amb with RW, bathing at shower level, dressing with sit<>stand (focus on donning AFO), and activity tolerance to increase independence with BADLs. Supine>sit EOB with supervision. Pt amb with RW to bathroom with CGA. Bathing at shower level with supervision. Pt requires assistance donning RLE AFO but completes all other dressing tasks with supervision. Amb with RW to day room with CGA. Pt practiced donning AFO, requiring min A to complete task. Will continue to practice task. Pt returned to room and remained in w/c with all needs within reach.   Therapy Documentation Precautions:  Precautions Precautions: Fall, Back Recall of Precautions/Restrictions: Intact Required Braces or Orthoses: Spinal Brace, Other Brace Spinal Brace: Lumbar corset Other Brace: AFO RLE Restrictions Weight Bearing Restrictions Per Provider Order: No Pain: Pain Assessment Pain Scale: 0-10 Pain Score: 7  Pain Location: Back Pain Intervention(s): Meds admin at beginning of session   Therapy/Group: Individual Therapy  [name] 09/22/2023, 9:35 AM

## 2023-09-23 ENCOUNTER — Other Ambulatory Visit (HOSPITAL_COMMUNITY): Payer: Self-pay

## 2023-09-23 MED ORDER — RIVAROXABAN 15 MG PO TABS
15.0000 mg | ORAL_TABLET | Freq: Every day | ORAL | Status: DC
  Administered 2023-09-23: 15 mg via ORAL
  Filled 2023-09-23: qty 1

## 2023-09-23 MED ORDER — TRAMADOL HCL 50 MG PO TABS
100.0000 mg | ORAL_TABLET | Freq: Three times a day (TID) | ORAL | 0 refills | Status: DC | PRN
  Filled 2023-09-23: qty 30, 5d supply, fill #0

## 2023-09-23 MED ORDER — DOXYCYCLINE HYCLATE 100 MG PO TABS
100.0000 mg | ORAL_TABLET | Freq: Two times a day (BID) | ORAL | 0 refills | Status: DC
  Filled 2023-09-23: qty 6, 3d supply, fill #0

## 2023-09-23 MED ORDER — RIVAROXABAN 20 MG PO TABS: 20.0000 mg | ORAL_TABLET | Freq: Every day | ORAL | 0 refills | Status: DC

## 2023-09-23 MED ORDER — POTASSIUM CHLORIDE CRYS ER 20 MEQ PO TBCR
20.0000 meq | EXTENDED_RELEASE_TABLET | Freq: Every day | ORAL | 0 refills | Status: DC
  Filled 2023-09-23: qty 30, 30d supply, fill #0

## 2023-09-23 MED ORDER — B-12 5000 MCG PO CAPS
1.0000 | ORAL_CAPSULE | Freq: Every day | ORAL | 0 refills | Status: DC
  Filled 2023-09-23: qty 30, fill #0

## 2023-09-23 MED ORDER — TAMSULOSIN HCL 0.4 MG PO CAPS
0.4000 mg | ORAL_CAPSULE | Freq: Every day | ORAL | 0 refills | Status: DC
  Filled 2023-09-23: qty 30, 30d supply, fill #0

## 2023-09-23 MED ORDER — TORSEMIDE 10 MG PO TABS
10.0000 mg | ORAL_TABLET | Freq: Every day | ORAL | 0 refills | Status: DC
  Filled 2023-09-23: qty 30, 30d supply, fill #0

## 2023-09-23 MED ORDER — SPIRONOLACTONE 25 MG PO TABS
25.0000 mg | ORAL_TABLET | Freq: Every day | ORAL | 0 refills | Status: DC
  Filled 2023-09-23: qty 30, 30d supply, fill #0

## 2023-09-23 MED ORDER — PANTOPRAZOLE SODIUM 40 MG PO TBEC
40.0000 mg | DELAYED_RELEASE_TABLET | Freq: Every day | ORAL | 0 refills | Status: DC
  Filled 2023-09-23: qty 30, 30d supply, fill #0

## 2023-09-23 MED ORDER — METHOCARBAMOL 500 MG PO TABS
500.0000 mg | ORAL_TABLET | Freq: Four times a day (QID) | ORAL | 0 refills | Status: AC
  Filled 2023-09-23: qty 120, 30d supply, fill #0

## 2023-09-23 MED ORDER — BISACODYL 10 MG RE SUPP: 10.0000 mg | Freq: Every day | RECTAL | Status: DC

## 2023-09-23 MED ORDER — ROSUVASTATIN CALCIUM 10 MG PO TABS
10.0000 mg | ORAL_TABLET | Freq: Every day | ORAL | 0 refills | Status: DC
  Filled 2023-09-23: qty 30, 30d supply, fill #0

## 2023-09-23 MED ORDER — ENSURE PLUS HIGH PROTEIN PO LIQD: 237.0000 mL | Freq: Two times a day (BID) | ORAL | Status: DC

## 2023-09-23 MED ORDER — METOPROLOL SUCCINATE ER 25 MG PO TB24
12.5000 mg | ORAL_TABLET | Freq: Two times a day (BID) | ORAL | 0 refills | Status: DC
  Filled 2023-09-23: qty 60, 60d supply, fill #0

## 2023-09-23 MED ORDER — DAPAGLIFLOZIN PROPANEDIOL 10 MG PO TABS
10.0000 mg | ORAL_TABLET | Freq: Every day | ORAL | 0 refills | Status: DC
  Filled 2023-09-23: qty 30, 30d supply, fill #0

## 2023-09-23 MED ORDER — AMIODARONE HCL 200 MG PO TABS
200.0000 mg | ORAL_TABLET | Freq: Two times a day (BID) | ORAL | 0 refills | Status: DC
  Filled 2023-09-23: qty 60, 30d supply, fill #0

## 2023-09-23 NOTE — Progress Notes (Signed)
 Physical Therapy Weekly Progress Note  Patient Details  Name: Aaron Richmond MRN: [id number] Date of Birth: 28-Mar-1945  Beginning of progress report period: September 11, 2023 End of progress report period: September 23, 2023  Patient has met 3 of 3 short term goals.  Pt is progressing towards LTGs, demonstrating improved R foot clearance during gait, improved upright posture during gait, and decreased assistance required for safe bed mobility, transfers, and gait. Pt is on track to meet LTGs.   Patient continues to demonstrate the following deficits muscle weakness, decreased cardiorespiratoy endurance, impaired timing and sequencing and decreased coordination, and decreased standing balance and decreased balance strategies and therefore will continue to benefit from skilled PT intervention to increase functional independence with mobility.  Patient progressing toward long term goals..  Continue plan of care.  PT Short Term Goals Week 2:  PT Short Term Goal 1 (Week 2): Pt will ambulate w/ RW and supervision. PT Short Term Goal 2 (Week 2): Pt will perform bed mobility w/ supervision. PT Short Term Goal 3 (Week 2): Pt will ascend/descend stairs w/ 1 handrail and SBA.  Skilled Therapeutic Interventions/Progress Updates:  Community reintegration;Ambulation/gait training;Balance/vestibular training;Discharge planning;Functional electrical stimulation;Pain management;Stair training;Neuromuscular re-education;DME/adaptive equipment instruction;Psychosocial support;UE/LE Strength taining/ROM;Wheelchair propulsion/positioning;UE/LE Coordination activities;Therapeutic Activities;Splinting/orthotics;Therapeutic Exercise;Patient/family education;Functional mobility training;Disease management/prevention;Cognitive remediation/compensation;Skin care/wound management   Therapy Documentation Precautions:  Precautions Precautions: Fall, Back Recall of Precautions/Restrictions: Intact Required Braces or  Orthoses: Spinal Brace, Other Brace Spinal Brace: Lumbar corset Other Brace: AFO RLE Restrictions Weight Bearing Restrictions Per Provider Order: No   Therapy/Group: Individual Therapy  [name] 09/23/2023, 6:34 AM

## 2023-09-23 NOTE — Patient Care Conference (Signed)
 Inpatient RehabilitationTeam Conference and Plan of Care Update Date: 09/23/2023   Time: 1132 am    Patient Name: Aaron Richmond      Medical Record Number: [id number]  Date of Birth: 01/18/1945 Sex: Male         Room/Bed: 4W11C/4W11C-01 Payor Info: Payor: [employer] / Plan: [employer] PPO / Product Type: *No Product type* /    Admit Date/Time:  09/10/2023  4:29 PM  Primary Diagnosis:  Acute incomplete paraplegia [facility])  Hospital Problems: Principal Problem:   Acute incomplete paraplegia [facility]) Active Problems:   Lumbar burst fracture (HCC)   Coping style affecting medical condition    Expected Discharge Date: Expected Discharge Date: 09/24/23  Team Members Present: Physician leading conference: Dr. Megan Lovorn Social Worker Present: Graeme Jude, LCSW Nurse Present: Eulalio Falls, RN PT Present: Schuyler Batter, PT OT Present: Delon Sharps, OT;Charlena Cha, COTA PPS Coordinator present : Eleanor Colon, SLP     Current Status/Progress Goal Weekly Team Focus  Bowel/Bladder   continent of bowel and bladder   remain continent of bowel and bladder   remain continent of bowel and bladder    Swallow/Nutrition/ Hydration               ADL's   bating/dressing-supervisoin but assist to don AFO; transfers-supervision; tileting-mod I   supervision overall; mod I toilet transfers and toileting   discharge planning, education    Mobility   supervision bed mob, minA sit to stand from bed, SBA from [facility], SBA for gait w/ RW   supervision for transfers & gait  improving balance strategies    Communication                Safety/Cognition/ Behavioral Observations               Pain   medicated x 1 for pain this shift thus far for a pain level of 8/10   keep pain level minimal   monitor for pain each shift    Skin   skin breakdown noted to bilateral buttocks and right heel   assess skin each shift  continue dressing changes as ordered       Discharge Planning:  Pt will d/c to home with his wife who will provide 24/7 care. SW will confirm there are no barriers to discharge.    Team Discussion: Patient admitted post L1 burst fracture with cord compression.  Patient limited by right foot drop, poor endurance and self limiting behaviors.    Patient on target to meet rehab goals: yes, Patient for discharge planning. Patient needs supervision assistance with ADLs, transfers. Patient needs supervision assistance with ambulation using a rolling walker. Overall goals  at discharge are set for supervision-mod I assistance.   *See Care Plan and progress notes for long and short-term goals.   Revisions to Treatment Plan:  AFO consult   Teaching Needs: Safety, medications, lumbar corset application, transfers, toileting, etc.   Current Barriers to Discharge: Decreased caregiver support, Home enviroment access/layout, and Wound care  Possible Resolutions to Barriers: Family Education Outpatient follow -up Patient has own RW; W/C     Medical Summary Current Status: continent of bowel and baldder- R heel DTI? most don't think actual DTI  Barriers to Discharge: Behavior/Mood;Weight bearing restrictions;Complicated Wound  Barriers to Discharge Comments: limited by R foot foot drop- has new AFO Possible Resolutions to Barriers/Weekly Focus: d/c tomorrow 9/17   Continued Need for Acute Rehabilitation Level of Care: The patient requires daily medical  management by a physician with specialized training in physical medicine and rehabilitation for the following reasons: Direction of a multidisciplinary physical rehabilitation program to maximize functional independence : Yes Medical management of patient stability for increased activity during participation in an intensive rehabilitation regime.: Yes Analysis of laboratory values and/or radiology reports with any subsequent need for medication adjustment and/or medical intervention. :  Yes   I attest that I was present, lead the team conference, and concur with the assessment and plan of the team.   Sukhdeep Wieting Gayo 09/23/2023, 1132 am

## 2023-09-23 NOTE — Progress Notes (Signed)
 Wound Plan   Wounds present: Wound 09/20/23 2010 Pressure Injury Heel Right Deep Tissue Pressure Injury - Purple or maroon localized area of discolored intact skin or blood-filled blister due to damage of underlying soft tissue from pressure and/or shear.   Interventions:   1.Consult to Registered Dietitian 2. WOC consult 3. Diet regular Room service appropriate/Thin  eating 90% 4. Turn patient q 2 hours 5.multivitamin with minerals tablet 1 tablet   1 tablet, Oral, Daily 6 .feeding supplement (ENSURE PLUS HIGH PROTEIN) liquid 237 mL 237 mL, Oral, 2 times daily between meals 7..Prevalon boot 8.Wound care Routine, Daily, First occurrence on Sun 09/21/23 at 1400 Cleanse R heel wound with Vashe Soila [phone]) do not rinse and allow to air dry. Apply Xeroform gauze (Lawson [phone]) to wound bed daily and secure with silicone foam or dry gauze and Kerlix roll gauze. Place R foot in Prevalon boot to offload pressure. Gerlean #[id number]   Braden Score: 18  Sensory: 3  Moisture: 4  Activity: 3  Mobility: 3  Nutrition: 3  Friction: 2  Contributors: Dr. Emeline Dr. Cornelio Polite, Doyal HERO, RN  Registered Nurse WOC Bullins, Powell PARAS, RN  Registered Nurse Eulalio Falls, RN, ELIN

## 2023-09-23 NOTE — Progress Notes (Signed)
 Physical Therapy Session Note  Patient Details  Name: Aaron Richmond MRN: [id number] Date of Birth: Aug 15, 1945  Today's Date: 09/23/2023 PT Individual Time: 0800-0900 PT Individual Time Calculation (min): 60 min   Short Term Goals: Week 2:  PT Short Term Goal 1 (Week 2): Pt will ambulate w/ RW and supervision. PT Short Term Goal 2 (Week 2): Pt will perform bed mobility w/ supervision. PT Short Term Goal 3 (Week 2): Pt will ascend/descend stairs w/ 1 handrail and SBA.  Skilled Therapeutic Interventions/Progress Updates:   Pt received supine in bed, eating breakfast. Agreeable to therapy. Pt reported 7/10 low back pain/soreness. Pain to be addressed with physical activity.   While pt ate breakfast, discussed and performed subjective discharge assessment. Assessed sensation and MMT as documented in discharge assessment. Pt demonstrated improved strength in BLE! Pt performed bed mobility with supervision, required total A for donning compression socks. Pt able to don shoes w/ modA and performed sit to stand transfer from EOB w/ RW and SBA for safety. Pt ambulated >242ft w/ RW and supervision assist to improve reintegration into community, functional activity tolerance, and decrease fall risk. Pt demonstrated improved R foot clearance w/ R AFO, improved step length bilaterally. Pt given seated rest break and was wheeled back to room dependently at end of session. Pt hand off to nsg staff in room.   Therapy Documentation Precautions:  Precautions Precautions: Fall, Back Recall of Precautions/Restrictions: Intact Required Braces or Orthoses: Spinal Brace, Other Brace Spinal Brace: Lumbar corset Other Brace: AFO RLE Restrictions Weight Bearing Restrictions Per Provider Order: No  Therapy/Group: Individual Therapy  [name] 09/23/2023, 6:25 AM

## 2023-09-23 NOTE — Progress Notes (Signed)
 Physical Therapy Discharge Summary  Patient Details  Name: Aaron Richmond MRN: [id number] Date of Birth: 1945-05-14  Date of Discharge from PT service:September 23, 2023  Today's Date: 09/23/2023 PT Individual Time: 1400-1500 PT Individual Time Calculation (min): 60 min    Patient has met 6 of 6 long term goals due to improved activity tolerance, improved balance, improved postural control, increased strength, ability to compensate for deficits, and improved coordination.  Patient to discharge at an ambulatory level Supervision.   Patient's care partner is independent to provide the necessary physical assistance at discharge. Pt to DC home w/ wife who is willing and able to provide supervision assist.   Recommendation:  Patient will benefit from ongoing skilled PT services in outpatient setting to continue to advance safe functional mobility, address ongoing impairments in R foot drop, dynamic standing balance, gait w/ head turns, and minimize fall risk.  Equipment: R AFO  Reasons for discharge: treatment goals met and discharge from hospital  Patient/family agrees with progress made and goals achieved: Yes  PT Discharge Precautions/Restrictions Precautions Precautions: Fall;Back Precaution Booklet Issued: Yes (comment) Recall of Precautions/Restrictions: Intact Precaution/Restrictions Comments: pt able to maintain back precautions Required Braces or Orthoses: Spinal Brace;Other Brace Spinal Brace: Lumbar corset Other Brace: AFO RLE Restrictions Weight Bearing Restrictions Per Provider Order: No Vital Signs Therapy Vitals Temp: 97.7 F (36.5 C) Temp Source: Oral Pulse Rate: 76 Resp: 16 BP: (!) 119/44 Patient Position (if appropriate): Sitting Oxygen Therapy SpO2: 96 % O2 Device: Room Air Pain Pain Assessment Pain Scale: 0-10 Pain Score: 7  Pain Location: Back Pain Intervention(s): Medication (See eMAR) Pain Interference Pain Interference Pain Effect on Sleep:  1. Rarely or not at all Pain Interference with Therapy Activities: 1. Rarely or not at all Pain Interference with Day-to-Day Activities: 1. Rarely or not at all Vision/Perception  Vision - History Ability to See in Adequate Light: 0 Adequate Perception Perception: Within Functional Limits Praxis Praxis: WFL  Cognition Overall Cognitive Status: Within Functional Limits for tasks assessed Arousal/Alertness: Awake/alert Memory: Appears intact Awareness: Appears intact Problem Solving: Appears intact Sensation Sensation Light Touch: Appears Intact Hot/Cold: Appears Intact Proprioception: Appears Intact Stereognosis: Appears Intact Motor  Motor Motor: Abnormal postural alignment and control Motor - Skilled Clinical Observations: general deconditioning, R foot drop Motor - Discharge Observations: improved R foot clearance w/ R AFO, improved endurance, improved upright posture during gait  Mobility Bed Mobility Bed Mobility: Supine to Sit;Rolling Left Rolling Left: Supervision/Verbal cueing Supine to Sit: Supervision/Verbal cueing Transfers Transfers: Sit to Stand;Stand to Sit;Stand Pivot Transfers Sit to Stand: Supervision/Verbal cueing Stand to Sit: Supervision/Verbal cueing Stand Pivot Transfers: Supervision/Verbal cueing Transfer (Assistive device): Rolling walker Locomotion  Gait Ambulation: Yes Gait Assistance: Supervision/Verbal cueing Gait Distance (Feet): 300 Feet Assistive device: Rolling walker Gait Assistance Details: Verbal cues for gait pattern;Verbal cues for precautions/safety Gait Gait: Yes Gait Pattern: Impaired Gait Pattern: Decreased step length - right;Decreased dorsiflexion - right;Poor foot clearance - left;Poor foot clearance - right;Ataxic;Step-through pattern Gait velocity: decreased High Level Ambulation High Level Ambulation: Head turns Head Turns: decreased gait speed w/ head turns Stairs / Additional Locomotion Stairs: Yes Stairs  Assistance: Supervision/Verbal cueing Stair Management Technique: One rail Right;Step to pattern Number of Stairs: 4 Height of Stairs: 6 Ramp: Supervision/Verbal cueing Curb: Doctor, hospital Mobility: Yes Wheelchair Assistance: Doctor, general practice: Both upper extremities Wheelchair Parts Management: Supervision/cueing Distance: 200  Trunk/Postural Assessment  Cervical Assessment Cervical Assessment: Exceptions to [facility] Thoracic Assessment Thoracic Assessment:  Exceptions to [facility] (back precautions) Lumbar Assessment Lumbar Assessment: Exceptions to [facility] (back precautions) Postural Control Postural Control: Deficits on evaluation  Balance Static Sitting Balance Static Sitting - Balance Support: Feet supported Static Sitting - Level of Assistance: 7: Independent Dynamic Sitting Balance Dynamic Sitting - Balance Support: During functional activity Dynamic Sitting - Level of Assistance: 6: Modified independent (Device/Increase time) Extremity Assessment  RUE Assessment RUE Assessment: Within Functional Limits LUE Assessment LUE Assessment: Within Functional Limits RLE Assessment RLE Assessment: Exceptions to [facility] General Strength Comments: globally 5/5, DF 0/5 LLE Assessment LLE Assessment: Exceptions to [facility] General Strength Comments: 5/5 globally, DF 4-/5   [name] 09/23/2023, 4:20 PM

## 2023-09-23 NOTE — Consult Note (Signed)
 WOC Nurse Consult Note:   Noted new consult for R heel pressure injury. Please see WOC notes dated 9/14.  Wound care orders remain in place and appropriate.  Will not re-consult at this time.   Thank you,  Doyal Polite, RN, MSN, [facility] WOC Team [phone] (Available Mon-Fri 0700-1500)

## 2023-09-23 NOTE — Progress Notes (Signed)
 Occupational Therapy Discharge Summary  Patient Details  Name: Aaron Richmond MRN: [id number] Date of Birth: May 05, 1945  Date of Discharge from OT service:September 23, 2023  Patient has met 9 of 9 long term goals due to {due un:6958348}. Pt made excellents progress with BADLs. Bathing at shower level with supervision. Dressing with supervision including donning LSO and RLE AFO. Toilet transfers and toileting with mod I. Pt's wife has not been present for therapy. Pt pleased with progress.  Patient to discharge at overall {LOA:3049010} level.  Patient's care partner {care partner:3041650} to provide the necessary {assistance:3041652} assistance at discharge.    Reasons goals not met: n/a  Recommendation:  Patient will benefit from ongoing skilled OT services in {setting:3041680} to continue to advance functional skills in the area of {ADL/iADL:3041649}.  Equipment: No equipment provided  Reasons for discharge: {Reason for discharge:3049018}  Patient/family agrees with progress made and goals achieved: {Pt/Family agree with progress/goals:3049020}  OT Discharge  ADL ADL Equipment Provided: Reacher, Long-handled sponge Eating: Independent Where Assessed-Eating: Wheelchair Grooming: Modified independent Where Assessed-Grooming: Sitting at sink Upper Body Bathing: Supervision/safety Where Assessed-Upper Body Bathing: Shower Lower Body Bathing: Supervision/safety Where Assessed-Lower Body Bathing: Shower Upper Body Dressing: Modified independent (Device) Where Assessed-Upper Body Dressing: Wheelchair Lower Body Dressing: Supervision/safety Where Assessed-Lower Body Dressing: Standing at sink, Sitting at sink, Wheelchair Toileting: Modified independent Where Assessed-Toileting: Teacher, adult education: Engineer, agricultural Method: Proofreader: Gaffer: Unable to assess Tub/Shower Transfer Method: Unable to  assess Film/video editor: Close supervision Film/video editor Method: Designer, industrial/product: Shower seat with back ADL Comments: Patient has shower chair, but sitting in it had become too painful prior to sx and would stand with walker in the shower. Vision Baseline Vision/History: 1 Wears glasses Patient Visual Report: No change from baseline Vision Assessment?: No apparent visual deficits Perception  Perception: Within Functional Limits Praxis Praxis: WFL Cognition Cognition Overall Cognitive Status: Within Functional Limits for tasks assessed Arousal/Alertness: Awake/alert Orientation Level: Person;Place;Situation Person: Oriented Place: Oriented Situation: Oriented Memory: Appears intact Awareness: Appears intact Problem Solving: Appears intact Brief Interview for Mental Status (BIMS) Repetition of Three Words (First Attempt): 3 Temporal Orientation: Year: Correct Temporal Orientation: Month: Accurate within 5 days Temporal Orientation: Day: Correct Recall: Sock: Yes, no cue required Recall: Blue: Yes, after cueing (a color) Recall: Bed: Yes, no cue required BIMS Summary Score: 14 Sensation Sensation Light Touch: Appears Intact Hot/Cold: Appears Intact Proprioception: Appears Intact Stereognosis: Appears Intact Motor  Motor Motor: Abnormal postural alignment and control Motor - Skilled Clinical Observations: general deconditioning, R foot drop Motor - Discharge Observations: improved R foot clearance w/ R AFO, improved endurance, improved upright posture during gait Mobility  Bed Mobility Bed Mobility: Supine to Sit;Rolling Left Rolling Left: Supervision/Verbal cueing Supine to Sit: Supervision/Verbal cueing Transfers Sit to Stand: Supervision/Verbal cueing Stand to Sit: Supervision/Verbal cueing  Trunk/Postural Assessment  Cervical Assessment Cervical Assessment: Exceptions to [facility] Thoracic Assessment Thoracic Assessment:  Exceptions to [facility] (back precautions) Lumbar Assessment Lumbar Assessment: Exceptions to [facility] (back precautions) Postural Control Postural Control: Deficits on evaluation  Balance Static Sitting Balance Static Sitting - Balance Support: Feet supported Static Sitting - Level of Assistance: 7: Independent Dynamic Sitting Balance Dynamic Sitting - Balance Support: During functional activity Dynamic Sitting - Level of Assistance: 6: Modified independent (Device/Increase time) Extremity/Trunk Assessment RUE Assessment RUE Assessment: Within Functional Limits LUE Assessment LUE Assessment: Within Functional Limits   [name] 09/23/2023, 3:22 PM

## 2023-09-23 NOTE — Progress Notes (Signed)
 Occupational Therapy Session Note  Patient Details  Name: Aaron Richmond MRN: [id number] Date of Birth: 26-Aug-1945  Today's Date: 09/23/2023 OT Individual Time: 1300-1330 OT Individual Time Calculation (min): 30 min    Short Term Goals: Week 2:  OT Short Term Goal 1 (Week 2): See LTG's 2/2 length of stay  Skilled Therapeutic Interventions/Progress Updates:    Skilled OT intervention with focus on doffing/donning RLE AFO. Pt wears slip on shoes and does not have to fasten. Pt able to position RLE appropriately to place foot in shoe with AFO in place and slip foot in with extra time. No assistance. Reviewed home safety precautions. Pt ready for discharge home tomorrow. All needs within reach.   Therapy Documentation Precautions:  Precautions Precautions: Fall, Back Recall of Precautions/Restrictions: Intact Precaution/Restrictions Comments: pt able to maintain back precautions Required Braces or Orthoses: Spinal Brace, Other Brace Spinal Brace: Lumbar corset Other Brace: AFO RLE Restrictions Weight Bearing Restrictions Per Provider Order: No Pain: Pt denies pain this afternoon    Therapy/Group: Individual Therapy  [name] 09/23/2023, 3:15 PM

## 2023-09-23 NOTE — Progress Notes (Signed)
 Physical Therapy Session Note  Patient Details  Name: Aaron Richmond MRN: [id number] Date of Birth: [birth year]  Today's Date: 09/23/2023 PT Individual Time: 1400-1500 PT Individual Time Calculation (min): 60 min   Short Term Goals: Week 1:  PT Short Term Goal 1 (Week 1): Pt will complete 3 consecutive sit to stand transfers w/ CGA PT Short Term Goal 1 - Progress (Week 1): Met PT Short Term Goal 2 (Week 1): Pt will perform 4 stairs w/ one handrail and side-stepping w/ minA PT Short Term Goal 2 - Progress (Week 1): Met PT Short Term Goal 3 (Week 1): Pt will demonstrate proper technique for sit to stand, using BUE to push off WC and not RW, 3 consecutive transfers in a row. PT Short Term Goal 3 - Progress (Week 1): Met Week 2:  PT Short Term Goal 1 (Week 2): Pt will ambulate w/ RW and supervision. PT Short Term Goal 2 (Week 2): Pt will perform bed mobility w/ supervision. PT Short Term Goal 3 (Week 2): Pt will ascend/descend stairs w/ 1 handrail and SBA.  Skilled Therapeutic Interventions/Progress Updates:    Pt in w/c when PT arrived, pt agreeable for session, however, reports he is tired.  Pt able to donn TSLO brace with mod indep.  Sit->stand to RW with Supervision, good sequencing.  Pt amb with RW x 150' with Supervision, cueing to facilitate right hip flexors to assist with clearing right foot.  Pt wheeled to outside for energy conservation.  Gait with RW, Supervision [phone] with x1 rest break.  Pt wheeled to main gym, performed BLE ex's with red TB: clams, marching 3x10; standing with RW marching, forward leg kicks-- with red TB above knees 5x5.  Pt wheeled to Nsg station and amb back to room ~x80', seated in w/c with all items within reach.   Therapy Documentation Precautions:  Precautions Precautions: Fall, Back Recall of Precautions/Restrictions: Intact Precaution/Restrictions Comments: pt able to maintain back precautions Required Braces or Orthoses: Spinal Brace, Other  Brace Spinal Brace: Lumbar corset Other Brace: AFO RLE Restrictions Weight Bearing Restrictions Per Provider Order: No   Pain: Pain Assessment Pain Scale: 0-10 Pain Score: 7  Pain Location: Back Pain Intervention(s): Medication (See eMAR) Mobility: Bed Mobility Bed Mobility: Supine to Sit;Rolling Left Rolling Left: Supervision/Verbal cueing Supine to Sit: Supervision/Verbal cueing Transfers Transfers: Sit to Stand;Stand to Sit;Stand Pivot Transfers Sit to Stand: Supervision/Verbal cueing Stand to Sit: Supervision/Verbal cueing Stand Pivot Transfers: Supervision/Verbal cueing Transfer (Assistive device): Rolling walker Locomotion :    Trunk/Postural Assessment : Cervical Assessment Cervical Assessment: Exceptions to [facility] Thoracic Assessment Thoracic Assessment: Exceptions to [facility] (back precautions) Lumbar Assessment Lumbar Assessment: Exceptions to [facility] (back precautions) Postural Control Postural Control: Deficits on evaluation  Balance: Static Sitting Balance Static Sitting - Balance Support: Feet supported Static Sitting - Level of Assistance: 7: Independent Dynamic Sitting Balance Dynamic Sitting - Balance Support: During functional activity Dynamic Sitting - Level of Assistance: 6: Modified independent (Device/Increase time) Exercises:     Therapy/Group: Individual Therapy  Aaron Richmond Fast 09/23/2023, 4:22 PM

## 2023-09-23 NOTE — Progress Notes (Signed)
 Occupational Therapy Session Note  Patient Details  Name: Aaron Richmond MRN: [id number] Date of Birth: April 27, 1945  Today's Date: 09/23/2023 OT Individual Time: 0915-1000 OT Individual Time Calculation (min): 45 min    Short Term Goals: Week 2:  OT Short Term Goal 1 (Week 2): See LTG's 2/2 length of stay  Skilled Therapeutic Interventions/Progress Updates:    Skilled OT intervention with focus on functional amb with RW, standing balance, bathing at shower level, dressing with sit<>stand, and safety awareness to increase independence with BADLs and prepare for discharge home tomorrow. All amb with RW at supervision. Bathing/dressing with supervision. Pt able to don RLE AFO without assistance. Assistance for compression hose but will not need to wear at home. Reviewed home safety recommendations. Pt pleased with progress and ready for discharge home tomorrow.  Therapy Documentation Precautions:  Precautions Precautions: Fall, Back Recall of Precautions/Restrictions: Intact Precaution/Restrictions Comments: pt able to maintain back precautions Required Braces or Orthoses: Spinal Brace, Other Brace Spinal Brace: Lumbar corset Other Brace: AFO RLE Restrictions Weight Bearing Restrictions Per Provider Order: No Pain: Pain Assessment Pain Scale: 0-10 Pain Score: 4  Pain Location: Back Pain Intervention(s): Meds admin prior to therapy   Therapy/Group: Individual Therapy  [name] 09/23/2023, 3:12 PM

## 2023-09-23 NOTE — Progress Notes (Signed)
 Patient ID: Aaron Richmond, male   DOB: Oct 28, 1945, 78 y.o.   MRN: [id number]  [id number] spoke with pt wife to provide  updates from team conference, d/c date remains tomorrow, and d/c recs- Outpatient PT and RW. Reports has RW already. Prefers [facility] Outpatient (p:[phone]/f:[phone]).  *SW met with pt in room to discuss above.   SW faxed outpatient referral.  *Sw received phone call from [facility] confirming referral received.   Graeme Jude, MSW, LCSW Office: [phone] Cell: [phone] Fax: [phone]

## 2023-09-23 NOTE — Progress Notes (Signed)
 PROGRESS NOTE   Subjective/Complaints:  Pt reports R foot wrapped last night- doesn't know why?  Using Prevalon boots at night- LBM this AM, but going back to go again.     ROS:  Pt denies SOB, abd pain, CP, N/V/C/D, and vision changes  Objective:   No results found.  Recent Labs    09/22/23 0549  WBC 9.0  HGB 9.3*  HCT 28.9*  PLT 532*     Recent Labs    09/22/23 0549  NA 139  K 4.4  CL 103  CO2 22  GLUCOSE 101*  BUN 26*  CREATININE 1.46*  CALCIUM  9.3      Intake/Output Summary (Last 24 hours) at 09/23/2023 1008 Last data filed at 09/23/2023 0800 Gross per 24 hour  Intake 713 ml  Output 1175 ml  Net -462 ml        Physical Exam: Vital Signs Blood pressure (!) 139/95, pulse 77, temperature 97.7 F (36.5 C), resp. rate 16, height 5' 11 (1.803 m), weight 72.8 kg, SpO2 96%.    General: awake, alert, appropriate, sitting EOB; NT in room to get to bathroom; NAD HENT: conjugate gaze; oropharynx moist CV: regular rate and rhythm controlled; no JVD Pulmonary: CTA B/L; no W/R/R- good air movement GI: soft, NT, ND, (+)BS Psychiatric: appropriate- slightly vague this AM Neurological: Ox3  Ext: no clubbing, cyanosis, or edema- stable Psych: pleasant and cooperative  Extremities; no LE edema, R foot drop  Skin: Bruising on b/l UE,  foam dressing both heels    PRIOR EXAMS: 09/16/23 image of back   09/19/23 image of back incision  9/15 wound with less fibronecrotic tissue, mild erythema still   Neurological:  Alert and oriented x 3. Normal insight and awareness. Intact Memory. Normal language and speech. Cranial nerve exam unremarkable. MMT:  MOTOR: RUE: 4+/5 Deltoid,4+/5 Biceps, 4+/5 Triceps,4+/5 Grip LUE: 4+/5 Deltoid, 4+/5 Biceps, 4+/5 Triceps, 4+/5 Grip RLE: HF 4-/5, KE 4+/5, ADF 0/5, APF 4/5 LLE: HF 4-/5, KE 4+/5, ADF 4/5, APF 4/5      REFLEXES: NO ankle clonus   SENSORY: Normal to  touch all 4 extremities   Coordination: No UE ataxia/dysmetria noted     Assessment/Plan: 1. Functional deficits which require 3+ hours per day of interdisciplinary therapy in a comprehensive inpatient rehab setting. Physiatrist is providing close team supervision and 24 hour management of active medical problems listed below. Physiatrist and rehab team continue to assess barriers to discharge/monitor patient progress toward functional and medical goals  Care Tool:  Bathing    Body parts bathed by patient: Right arm, Left arm, Chest, Abdomen, Front perineal area, Buttocks, Right upper leg, Left upper leg, Right lower leg, Left lower leg, Face   Body parts bathed by helper: Right lower leg, Left lower leg     Bathing assist Assist Level: Supervision/Verbal cueing     Upper Body Dressing/Undressing Upper body dressing   What is the patient wearing?: Pull over shirt, Orthosis Orthosis activity level: Performed by patient  Upper body assist Assist Level: Supervision/Verbal cueing    Lower Body Dressing/Undressing Lower body dressing      What is the patient wearing?: Pants  Lower body assist Assist for lower body dressing: Supervision/Verbal cueing     Toileting Toileting    Toileting assist Assist for toileting: Contact Guard/Touching assist     Transfers Chair/bed transfer  Transfers assist     Chair/bed transfer assist level: Supervision/Verbal cueing     Locomotion Ambulation   Ambulation assist      Assist level: Supervision/Verbal cueing Assistive device: Walker-rolling Max distance: 150   Walk 10 feet activity   Assist     Assist level: Supervision/Verbal cueing Assistive device: Walker-rolling   Walk 50 feet activity   Assist    Assist level: Supervision/Verbal cueing Assistive device: Walker-rolling    Walk 150 feet activity   Assist Walk 150 feet activity did not occur: Safety/medical concerns  Assist level:  Supervision/Verbal cueing Assistive device: Walker-rolling    Walk 10 feet on uneven surface  activity   Assist Walk 10 feet on uneven surfaces activity did not occur: Safety/medical concerns   Assist level: Supervision/Verbal cueing Assistive device: Walker-rolling   Wheelchair     Assist Is the patient using a wheelchair?: Yes Type of Wheelchair: Manual    Wheelchair assist level: Supervision/Verbal cueing Max wheelchair distance: 150    Wheelchair 50 feet with 2 turns activity    Assist        Assist Level: Supervision/Verbal cueing   Wheelchair 150 feet activity     Assist      Assist Level: Supervision/Verbal cueing   Blood pressure (!) 139/95, pulse 77, temperature 97.7 F (36.5 C), resp. rate 16, height 5' 11 (1.803 m), weight 72.8 kg, SpO2 96%.  Medical Problem List and Plan: 1. Functional deficits secondary to L1 burst fracture- with traumatic incomplete ASIA D paraplegia with cord compression status post T10-L4 fusion with transpedicular decompression L1.  Lumbar corset when out of bed applied in sitting position             -patient may shower             -ELOS/Goals: 09/24/23, PT/OT mina/sup            Main limitation- LE weakness- can do Estim for R foot drop- has been >2 years since prostate CA -awaiting AFO from [facility]: today Con't CIR PT and OT Team conference to finalize d/c.  2.  Antithrombotics: -DVT/anticoagulation:  Pharmaceutical: Heparin  transitioning back to Xarelto  9/4- Xarelto  20mg  daily to start today             -antiplatelet therapy: N/A 3. Pain Management: Tramadol /hydrocodone  as needed, Robaxin  500 mg 4 times daily 9/5- Pt reports pain bothering him this AM- but hasn't had pain meds- asked nursing to get-  9/15- just sore- pt reports improvement 4. Mood/Behavior/Sleep: Provide emotional support             -antipsychotic agents: N/A 5. Neuropsych/cognition: This patient is capable of making decisions on his own  behalf. 6. Skin/Wound Care: Routine skin checks  9/8- bruising on feet- will monitor  9/12 back incision appears to have opened up a little with fibronecrotic debris centrally. Doesn't appear infected.    -place medihoney on necrotic tissue with dry dressing over incision, removed residual steristrips from top   -contact Dr. Onetha to see if he wants to do anything different -09/20/23 see note from Dr. Louis, states topical treatment as per WOC, start doxycycline  100mg  BID x7d for surrounding cellulitis (ordered by them), monitor 9/15 wound looks sl improved. Less fibronecrotic tissue. Wbc's 9k. No fever or new pain. Continue  current wound care above. 9/16- looks similar to picture above. Less redness 7. Fluids/Electrolytes/Nutrition: Routine in and outs with follow-up chemistries. Continue K supplementation, vitamins, and protonix  40mg  daily 8.  Acute blood loss anemia.  Follow-up CBC. Hgb stable on labs 9.  Shock/narrow complex tachycardia with hypotension.  Resolved.  Follow-up cardiology services 10.  Chronic systolic congestive heart failure/nonischemic CMP.  Demadex  20 mg daily, Aldactone  25 mg daily, Farxiga  10 mg daily.  Monitor for any fluid overload. Daily weights  9/4- Last weight yesterday 75 kg- will ask nursing to get weight daily  9/5- Weight 75.4 kg- stable  -09/14/23 wt stable today. Monitor.   9/8- Weight over all stable   9/9- weight stable but Cr 1.67- will decrease Torsemide  to 10 mg daily for now- 9/11-15 weights holding around 72kg--continue to monitor with 10mg  torsemide  9/16- Weight 72.8 kg- overall stable Filed Weights   09/21/23 0447 09/22/23 0500 09/23/23 0500  Weight: 71.9 kg 72.3 kg 72.8 kg    11.  CAD.  Toprol -XL 12.5 mg BID.  Monitor with increased mobility 12.  Atrial fibrillation.  Transitioning from IV heparin  back to Xarelto .  Continue amiodarone  200 mg twice daily for now.  Continue metoprolol   - rate controlled 13.  CKD stage IIIA.  Follow-up chemistries.   Avoid nephrotoxic medications.  9/4- pt's Cr 1.30- is still in baseline range- con't to monitor  9/8- Cr 1.67 up from 1.30 and prior 1.01- and BUN up to 26- from 13- with CHF, don't want to hold meds or give IVFs- if doesn't improve with pushing fluids today- will hold his diuretics tomorrow  9/9- same numbers- on BUN/Cr- will decrease Torsemide  to 10 mg daily as well as give 500ml of NS IVFs- to tank him up slightly  9/10- Cr down to 1.39 and BUN stable at 25- better- will recheck in AM- since have also reduced Torsemide - also monitor weight.   9/15 Cr 1.45, I suspect this is near his baseline-   -continue to encourage appropriate fluid intake, same torsemide    14.  Hyperlipidemia.  Crestor  10mg  daily 15.  History of prostate cancer.  Flomax  0.4 mg daily.- per PA, was 2022- pt said doesn't remember when but more than 3 years ago. 16.  Constipation.  MiraLAX  twice daily             - pt had T3 bm this morning 9/15 17.R foot drop-chronic  9/4- from Hip surgery in January- went over with pt just the hip fx could have caused the R foot drop- we can do E stim- since has been since 2017 since prostate CA -9/15 awaiting AFO today? 9/16- said ankle wrapped last night- not clear why- AFO still not here- will d/w team in conference 18. Leukocytosis  9/8- WBC 11k- don't see reason- d/w nursing- incision looks stable- no URI/UTI Sx's- will recheck in AM  9/9- WBC down to 9.6- so is fine- per nursing and picture, incision slightly localized erythema, but ok  - wbc's normal, see wound discussion above   19. Ear wax. Discussed that it has  nothing to do with his back/neuro dx.   - debrox for 3 days from 9/11  -improved   I spent a total of 39   minutes on total care today- >50% coordination of care- due to  D/w team about AFO- also review of chart, labs, vitals and B/B- also team conference to finalize d/c LOS: 13 days A FACE TO FACE EVALUATION WAS PERFORMED  Leslie Jester  09/23/2023, 10:08 AM

## 2023-09-24 ENCOUNTER — Other Ambulatory Visit (HOSPITAL_COMMUNITY): Payer: Self-pay

## 2023-09-24 MED ORDER — POTASSIUM CHLORIDE CRYS ER 20 MEQ PO TBCR
20.0000 meq | EXTENDED_RELEASE_TABLET | Freq: Every day | ORAL | 0 refills | Status: DC
  Filled 2023-09-24: qty 30, 30d supply, fill #0

## 2023-09-24 MED ORDER — RIVAROXABAN 15 MG PO TABS
15.0000 mg | ORAL_TABLET | Freq: Every day | ORAL | 0 refills | Status: DC
  Filled 2023-09-24: qty 42, 42d supply, fill #0

## 2023-09-24 MED ORDER — TORSEMIDE 10 MG PO TABS
20.0000 mg | ORAL_TABLET | Freq: Every day | ORAL | 0 refills | Status: DC
  Filled 2023-09-24: qty 60, 30d supply, fill #0

## 2023-09-24 MED ORDER — TORSEMIDE 20 MG PO TABS
20.0000 mg | ORAL_TABLET | Freq: Two times a day (BID) | ORAL | 0 refills | Status: DC
  Filled 2023-09-24: qty 30, 15d supply, fill #0

## 2023-09-24 NOTE — Progress Notes (Signed)
 Wound supplies given to patient and wife. Wound care education done with patient and wife no further questions noted.

## 2023-09-24 NOTE — Progress Notes (Signed)
 PROGRESS NOTE   Subjective/Complaints:  Pt has no complaints- ready to sleep in his own bed.     ROS:   Pt denies SOB, abd pain, CP, N/V/C/D, and vision changes   Objective:   No results found.  Recent Labs    09/22/23 0549  WBC 9.0  HGB 9.3*  HCT 28.9*  PLT 532*     Recent Labs    09/22/23 0549  NA 139  K 4.4  CL 103  CO2 22  GLUCOSE 101*  BUN 26*  CREATININE 1.46*  CALCIUM  9.3      Intake/Output Summary (Last 24 hours) at 09/24/2023 0803 Last data filed at 09/24/2023 0449 Gross per 24 hour  Intake 480 ml  Output 925 ml  Net -445 ml        Physical Exam: Vital Signs Blood pressure (!) 132/44, pulse 72, temperature 98.2 F (36.8 C), temperature source Oral, resp. rate 16, height 5' 11 (1.803 m), weight 76.2 kg, SpO2 97%.     General: awake, alert, appropriate, sitting up in bed;  NAD HENT: conjugate gaze; oropharynx moist CV: regular rate; no JVD Pulmonary: CTA B/L; no W/R/R- good air movement GI: soft, NT, ND, (+)BS- normoactive Psychiatric: appropriate Neurological: Ox3   Ext: no clubbing, cyanosis, or edema- stable Psych: pleasant and cooperative  Extremities; no LE edema, R foot drop  Skin: Bruising on b/l UE,  foam dressing both heels    PRIOR EXAMS: 09/16/23 image of back   09/19/23 image of back incision  9/15 wound with less fibronecrotic tissue, mild erythema still   Neurological:  Alert and oriented x 3. Normal insight and awareness. Intact Memory. Normal language and speech. Cranial nerve exam unremarkable. MMT:  MOTOR: RUE: 4+/5 Deltoid,4+/5 Biceps, 4+/5 Triceps,4+/5 Grip LUE: 4+/5 Deltoid, 4+/5 Biceps, 4+/5 Triceps, 4+/5 Grip RLE: HF 4-/5, KE 4+/5, ADF 0/5, APF 4/5 LLE: HF 4-/5, KE 4+/5, ADF 4/5, APF 4/5      REFLEXES: NO ankle clonus   SENSORY: Normal to touch all 4 extremities   Coordination: No UE ataxia/dysmetria noted     Assessment/Plan: 1.  Functional deficits which require 3+ hours per day of interdisciplinary therapy in a comprehensive inpatient rehab setting. Physiatrist is providing close team supervision and 24 hour management of active medical problems listed below. Physiatrist and rehab team continue to assess barriers to discharge/monitor patient progress toward functional and medical goals  Care Tool:  Bathing    Body parts bathed by patient: Right arm, Left arm, Chest, Abdomen, Front perineal area, Buttocks, Right upper leg, Left upper leg, Right lower leg, Left lower leg, Face   Body parts bathed by helper: Right lower leg, Left lower leg     Bathing assist Assist Level: Supervision/Verbal cueing     Upper Body Dressing/Undressing Upper body dressing   What is the patient wearing?: Pull over shirt, Orthosis Orthosis activity level: Performed by patient  Upper body assist Assist Level: Supervision/Verbal cueing    Lower Body Dressing/Undressing Lower body dressing      What is the patient wearing?: Pants     Lower body assist Assist for lower body dressing: Supervision/Verbal cueing     Toileting  Toileting    Toileting assist Assist for toileting: Independent with assistive device     Transfers Chair/bed transfer  Transfers assist     Chair/bed transfer assist level: Supervision/Verbal cueing     Locomotion Ambulation   Ambulation assist      Assist level: Supervision/Verbal cueing Assistive device: Walker-rolling Max distance: 200'   Walk 10 feet activity   Assist     Assist level: Supervision/Verbal cueing Assistive device: Walker-rolling   Walk 50 feet activity   Assist    Assist level: Supervision/Verbal cueing Assistive device: Walker-rolling    Walk 150 feet activity   Assist Walk 150 feet activity did not occur: Safety/medical concerns  Assist level: Supervision/Verbal cueing Assistive device: Walker-rolling    Walk 10 feet on uneven surface   activity   Assist Walk 10 feet on uneven surfaces activity did not occur: Safety/medical concerns   Assist level: Supervision/Verbal cueing Assistive device: Walker-rolling   Wheelchair     Assist Is the patient using a wheelchair?: Yes Type of Wheelchair: Manual    Wheelchair assist level: Supervision/Verbal cueing Max wheelchair distance: 150    Wheelchair 50 feet with 2 turns activity    Assist        Assist Level: Supervision/Verbal cueing   Wheelchair 150 feet activity     Assist      Assist Level: Supervision/Verbal cueing   Blood pressure (!) 132/44, pulse 72, temperature 98.2 F (36.8 C), temperature source Oral, resp. rate 16, height 5' 11 (1.803 m), weight 76.2 kg, SpO2 97%.  Medical Problem List and Plan: 1. Functional deficits secondary to L1 burst fracture- with traumatic incomplete ASIA D paraplegia with cord compression status post T10-L4 fusion with transpedicular decompression L1.  Lumbar corset when out of bed applied in sitting position             -patient may shower             -ELOS/Goals: 09/24/23, PT/OT mina/sup            Main limitation- LE weakness- can do Estim for R foot drop- has been >2 years since prostate CA Got AFO- working well per pt and therapy D/c today- will need f/u with me.   2.  Antithrombotics: -DVT/anticoagulation:  Pharmaceutical: Heparin  transitioning back to Xarelto  9/4- Xarelto  20mg  daily to start today             -antiplatelet therapy: N/A 3. Pain Management: Tramadol /hydrocodone  as needed, Robaxin  500 mg 4 times daily 9/5- Pt reports pain bothering him this AM- but hasn't had pain meds- asked nursing to get-  9/15- just sore- pt reports improvement 9/17- no significant pain 4. Mood/Behavior/Sleep: Provide emotional support             -antipsychotic agents: N/A 5. Neuropsych/cognition: This patient is capable of making decisions on his own behalf. 6. Skin/Wound Care: Routine skin checks  9/8-  bruising on feet- will monitor  9/12 back incision appears to have opened up a little with fibronecrotic debris centrally. Doesn't appear infected.    -place medihoney on necrotic tissue with dry dressing over incision, removed residual steristrips from top   -contact Dr. Onetha to see if he wants to do anything different -09/20/23 see note from Dr. Louis, states topical treatment as per [facility]OC, start doxycycline  100mg  BID x7d for surrounding cellulitis (ordered by them), monitor 9/15 wound looks sl improved. Less fibronecrotic tissue. Wbc's 9k. No fever or new pain. Continue current wound care above. 9/16- looks  similar to picture above. Less redness 7. Fluids/Electrolytes/Nutrition: Routine in and outs with follow-up chemistries. Continue K supplementation, vitamins, and protonix  40mg  daily 8.  Acute blood loss anemia.  Follow-up CBC. Hgb stable on labs 9.  Shock/narrow complex tachycardia with hypotension.  Resolved.  Follow-up cardiology services 10.  Chronic systolic congestive heart failure/nonischemic CMP.  Demadex  20 mg daily, Aldactone  25 mg daily, Farxiga  10 mg daily.  Monitor for any fluid overload. Daily weights  9/4- Last weight yesterday 75 kg- will ask nursing to get weight daily  9/5- Weight 75.4 kg- stable  -09/14/23 wt stable today. Monitor.   9/8- Weight over all stable   9/9- weight stable but Cr 1.67- will decrease Torsemide  to 10 mg daily for now- 9/11-15 weights holding around 72kg--continue to monitor with 10mg  torsemide  9/16- Weight 72.8 kg- overall stable 9/17- Weight up 8 lbs! This doesn't appear to be measured right-  Filed Weights   09/22/23 0500 09/23/23 0500 09/24/23 0447  Weight: 72.3 kg 72.8 kg 76.2 kg    11.  CAD.  Toprol -XL 12.5 mg BID.  Monitor with increased mobility 12.  Atrial fibrillation.  Transitioning from IV heparin  back to Xarelto .  Continue amiodarone  200 mg twice daily for now.  Continue metoprolol   - rate controlled 13.  CKD stage IIIA.  Follow-up  chemistries.  Avoid nephrotoxic medications.  9/4- pt's Cr 1.30- is still in baseline range- con't to monitor  9/8- Cr 1.67 up from 1.30 and prior 1.01- and BUN up to 26- from 13- with CHF, don't want to hold meds or give IVFs- if doesn't improve with pushing fluids today- will hold his diuretics tomorrow  9/9- same numbers- on BUN/Cr- will decrease Torsemide  to 10 mg daily as well as give 500ml of NS IVFs- to tank him up slightly  9/10- Cr down to 1.39 and BUN stable at 25- better- will recheck in AM- since have also reduced Torsemide - also monitor weight.   9/15 Cr 1.45, I suspect this is near his baseline-   -continue to encourage appropriate fluid intake, same torsemide    14.  Hyperlipidemia.  Crestor  10mg  daily 15.  History of prostate cancer.  Flomax  0.4 mg daily.- per PA, was 2022- pt said doesn't remember when but more than 3 years ago. 16.  Constipation.  MiraLAX  twice daily             - pt had T3 bm this morning 9/15 17.R foot drop-chronic  9/4- from Hip surgery in January- went over with pt just the hip fx could have caused the R foot drop- we can do E stim- since has been since 2017 since prostate CA -9/15 awaiting AFO today? 9/16- said ankle wrapped last night- not clear why- AFO still not here- will d/w team in conference 9/17- got AFO- working well  18. Leukocytosis  9/8- WBC 11k- don't see reason- d/w nursing- incision looks stable- no URI/UTI Sx's- will recheck in AM  9/9- WBC down to 9.6- so is fine- per nursing and picture, incision slightly localized erythema, but ok  - wbc's normal, see wound discussion above   19. Ear wax. Discussed that it has  nothing to do with his back/neuro dx.   - debrox for 3 days from 9/11  -improved   I spent a total of 30   minutes on total care today- >50% coordination of care- due to d/w PA about d/c plans and f/u's as well as went over orders  The patient is medically ready  for discharge to home and will need follow-up with [facility]. In  addition, they will need to follow up with their PCP, Neurosurgery.    LOS: 14 days A FACE TO FACE EVALUATION WAS PERFORMED  Kare Dado 09/24/2023, 8:03 AM

## 2023-09-24 NOTE — Progress Notes (Signed)
 Inpatient Rehabilitation Discharge Medication Review by a Pharmacist  A complete drug regimen review was completed for this patient to identify any potential clinically significant medication issues.  High Risk Drug Classes Is patient taking? Indication by Medication  Antipsychotic No   Anticoagulant Yes Rivaroxaban  - Afib  Antibiotic Yes Doxycycline  po - wound  Opioid Yes Tramadol  prn pain  Antiplatelet No   Hypoglycemics/insulin Yes Dapagliflozin - CHF  Vasoactive Medication Yes Amiodarone , Metoprolol  - HTN, Afib Spironolactone , Torsemide  - fluid, CHF Tamsulosin  - BPH  Chemotherapy No   Other Yes Potassium - supplement Methocarbamol  - prn spasms Rosuvastatin  - HLD Pantoprazole  - reflux      Type of Medication Issue Identified Description of Issue Recommendation(s)  Drug Interaction(s) (clinically significant)     Duplicate Therapy     Allergy     No Medication Administration End Date     Incorrect Dose     Additional Drug Therapy Needed     Significant med changes from prior encounter (inform family/care partners about these prior to discharge).    Other       Clinically significant medication issues were identified that warrant physician communication and completion of prescribed/recommended actions by midnight of the next day:  No  Name of provider notified for urgent issues identified:   Provider Method of Notification:     Pharmacist comments: None  Time spent performing this drug regimen review (minutes):  20 minutes   Thank you. Olam Monte, PharmD

## 2023-09-24 NOTE — Progress Notes (Signed)
 Inpatient Rehabilitation Care Coordinator Discharge Note   Patient Details  Name: Aaron Richmond MRN: [id number] Date of Birth: 1945-11-14   Discharge location: D/c to home  Length of Stay: 13 days  Discharge activity level: Supervision  Home/community participation: Limited  Patient response un:Yzjouy Literacy - How often do you need to have someone help you when you read instructions, pamphlets, or other written material from your doctor or pharmacy?: Rarely  Patient response un:Dnrpjo Isolation - How often do you feel lonely or isolated from those around you?: Rarely  Services provided included: MD, RD, OT, PT, RN, CM, TR, Pharmacy, SW, Neuropsych  Financial Services:  Field seismologist Utilized: Private Insurance [employer]  Choices offered to/list presented to: patient and wife  Follow-up services arranged:  Outpatient    Outpatient Servicies: [facility] for PT only      Patient response to transportation need: Is the patient able to respond to transportation needs?: Yes In the past 12 months, has lack of transportation kept you from medical appointments or from getting medications?: No In the past 12 months, has lack of transportation kept you from meetings, work, or from getting things needed for daily living?: No   Patient/Family verbalized understanding of follow-up arrangements:  Yes  Individual responsible for coordination of the follow-up plan: contact pt or pt wife  Confirmed correct DME delivered: Aaron Richmond 09/24/2023    Comments (or additional information):  Summary of Stay    Date/Time Discharge Planning CSW  09/23/23 0956 Pt will d/c to home with his wife who will provide 24/7 care. SW will confirm there are no barriers to discharge. AAC  09/15/23 1443 Pt will d/c to home with his wife who will provide 24/7 care. SW will confirm there are no barriers to discharge. AAC       Aaron Richmond

## 2023-09-26 ENCOUNTER — Other Ambulatory Visit: Payer: Self-pay | Admitting: Cardiology

## 2023-09-26 DIAGNOSIS — I4819 Other persistent atrial fibrillation: Secondary | ICD-10-CM

## 2023-09-27 DIAGNOSIS — G4733 Obstructive sleep apnea (adult) (pediatric): Secondary | ICD-10-CM | POA: Diagnosis not present

## 2023-10-01 ENCOUNTER — Telehealth (HOSPITAL_COMMUNITY): Payer: Self-pay

## 2023-10-01 NOTE — Telephone Encounter (Signed)
 Called to confirm/remind patient of [facility] appointment [facility] on 10/[facility].   Appointment:   [x] Confirmed  [] Left mess   [] No answer/No voice mail  [] VM Full/unable to leave message  [] Phone not in service  Patient reminded to bring all medications and/or complete list.  Confirmed patient has transportation. Gave directions, instructed to utilize valet parking.

## 2023-10-01 NOTE — Progress Notes (Signed)
 [facility] CLINIC CONSULT NOTE  Referring Physician: Fleeta Valeria Mayo, MD Primary Care: Fleeta Valeria, Mayo, MD Primary Cardiologist: Dr. Monetta HF Cardiologist: Dr. Rolan  HPI: 78 y.o. male with history of permanent AF, HFrEF, hx thoracic aortic aneurysm followed by TCTS, and nonobstructive CAD (coronary CTA with nonobstructive disease in LAD and diagonal branches, calcium  score 1959). Other history includes severe central and obstructive sleep apnea on BiPAP, remote hx PE, prostate cancer w/ mets to bone.  He follows with Dr. Monetta in [place].   EF was 50-55% by echo in 10/23. EF slightly lower at 40-45% on echo at [facility] 06/24. Most recent echo 06/25 with EF 35-40%, RV okay, severe LAE, mild to moderate MR, moderate AI, ascending aorta measures 56 mm.   He presented for bilateral transpedicular decompression of L1 for L1 burst fracture with cord compression on 09/03/23. Post op had unstable SVT and required pressor support. Cardiology evaluated patient and he failed cardioversion X 2. EP then called. He failed adenosine 6 and 12, and another cardioversion. Started on IV amiodarone  and converted to AF with RVR after 360 J shock with [place]. He was transferred to the ICU on pressor support and IV amiodarone . He had no recurrent rhythm issues and was later switched to po amiodarone .  Had difficulty titrating NE due to low BP. Advanced [facility] Failure asked to see to assist with management.      Past Medical History:  Diagnosis Date   Aneurysm of aortic root    Overview:  Last Assessment & Plan:  Planning to see Dr Army for evaluation of 5cm aneurysm.  - will check PFT in prep for possible procedure / SGY   Aortic atherosclerosis 06/14/2022   Aortic regurgitation    Aortic valve insufficiency    Ascending aortic aneurysm 09/11/2016   Atherosclerotic vascular disease 05/27/2022   Atrial fibrillation (HCC)    Atrial fibrillation with RVR (HCC) 07/05/2022   BMI 27.0-27.9,adult  06/14/2022   BPH (benign prostatic hyperplasia)    BRBPR (bright red blood per rectum) 05/20/2023   Cardiomyopathy (HCC) 09/11/2016   CHF (congestive heart failure) (HCC)    CKD (chronic kidney disease) stage 2, GFR 60-89 ml/min 11/23/2021   Closed fracture of right hip (HCC) 03/10/2023   Coronary artery calcification seen on CT scan 10/13/2017   Dysrhythmia    Essential hypertension 06/14/2022   Gastrointestinal hemorrhage 05/20/2023   Heart murmur    High risk medication use 07/31/2022   History of cardiomyopathy 11/09/2021   History of pulmonary embolism 09/24/2012   Overview:  Last Assessment & Plan:  Hx PE x 2 per notes, ? 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  - needs auto-titration study by [employer] or local company, data to RB to adjust his home device.    OSA (obstructive sleep apnea) 09/13/2022   Prediabetes    Primary osteoarthritis involving multiple joints 06/14/2022   Prostate cancer metastatic to bone [facility])  06/14/2022   Pulmonary embolism (HCC)    Rhinitis    Sacral wound 04/11/2023   Seasonal allergies 06/14/2022   Spondylolisthesis of lumbar region 12/12/2021   Stage 3a chronic kidney disease (HCC) 06/14/2022   Unstageable pressure ulcer of sacral region [facility]) 03/10/2023    Current Outpatient Medications  Medication Sig Dispense Refill   acetaminophen  (TYLENOL ) 325 MG tablet Take 2 tablets (650  mg total) by mouth every 4 (four) hours as needed for mild pain (pain score 1-3) or fever (or temp > 100.5).     amiodarone  (PACERONE ) 200 MG tablet Take 1 tablet (200 mg total) by mouth 2 (two) times daily. 60 tablet 0   bisacodyl  (DULCOLAX) 10 MG suppository Place 1 suppository (10 mg total) rectally daily.     Cyanocobalamin  (B-12) 5000 MCG CAPS Take 1 capsule by mouth daily at 12 noon. 30 capsule 0   dapagliflozin  propanediol (FARXIGA ) 10 MG TABS tablet Take 1 tablet (10 mg total) by mouth daily. 30 tablet 0   doxycycline  (VIBRA -TABS) 100 MG tablet Take 1 tablet (100 mg total) by mouth every 12 (twelve) hours. 6 tablet 0   methocarbamol  (ROBAXIN ) 500 MG tablet Take 1 tablet (500 mg total) by mouth 4 (four) times daily. 120 tablet 0   metoprolol  succinate (TOPROL -XL) 25 MG 24 hr tablet Take 0.5 tablets (12.5 mg total) by mouth 2 (two) times daily. 60 tablet 0   Multiple Vitamins-Minerals (MENS 50+ MULTIVITAMIN) TABS Take 1 tablet by mouth every evening.     pantoprazole  (PROTONIX ) 40 MG tablet Take 1 tablet (40 mg total) by mouth daily. 30 tablet 0   polyethylene glycol (MIRALAX  / GLYCOLAX ) 17 g packet Take 17 g by mouth daily.     potassium chloride  SA (KLOR-CON  M20) 20 MEQ tablet Take 1 tablet (20 mEq total) by mouth daily. 30 tablet 0   Rivaroxaban  (XARELTO ) 15 MG TABS tablet Take 1 tablet (15 mg total) by mouth daily with supper. 42 tablet 0   rosuvastatin  (CRESTOR ) 10 MG tablet Take 1 tablet (10 mg total) by mouth daily. 30 tablet 0   spironolactone  (ALDACTONE ) 25 MG tablet Take 1 tablet (25 mg total) by mouth daily. 30 tablet 0   tamsulosin  (FLOMAX ) 0.4 MG CAPS capsule Take 1 capsule (0.4 mg total) by mouth daily. 30 capsule 0   torsemide  (DEMADEX ) 10 MG tablet Take 2 tablets (20 mg total) by mouth daily. 60 tablet 0   traMADol  (ULTRAM ) 50 MG tablet Take 2 tablets (100 mg total) by mouth every 8 (eight) hours as needed for moderate pain (pain score 4-6). 30 tablet 0   No current  facility-administered medications for this visit.    Allergies  Allergen Reactions   Cipro [Ciprofloxacin Hcl] Other (See Comments)    Increased risk of rupture  of ascending thoracic aortic aneurysm      Social History   Socioeconomic History   Marital status: Married    Spouse name: Not on file   Number of children: 0   Years of education: Not on file   Highest education level: Not on file  Occupational History   Occupation: Chief Operating Officer  Tobacco Use   Smoking status: Never   Smokeless tobacco: Never  Vaping Use   Vaping status: Never Used  Substance and Sexual Activity   Alcohol  use: No   Drug use: No   Sexual activity: Not on file  Other Topics Concern   Not on file  Social History Narrative   Not on  file   Social Drivers of Health   Financial Resource Strain: Not on file  Food Insecurity: No Food Insecurity (09/03/2023)   Hunger Vital Sign    Worried About Running Out of Food in the Last Year: Never true    Ran Out of Food in the Last Year: Never true  Transportation Needs: No Transportation Needs (09/03/2023)   PRAPARE - Administrator, Civil Service (Medical): No    Lack of Transportation (Non-Medical): No  Physical Activity: Not on file  Stress: Not on file  Social Connections: Socially Integrated (09/03/2023)   Social Connection and Isolation Panel    Frequency of Communication with Friends and Family: More than three times a week    Frequency of Social Gatherings with Friends and Family: Once a week    Attends Religious Services: More than 4 times per year    Active Member of [employer] or Organizations: Yes    Attends Banker Meetings: 1 to 4 times per year    Marital Status: Married  Catering manager Violence: Unknown (09/03/2023)   Humiliation, Afraid, Rape, and Kick questionnaire    Fear of Current or Ex-Partner: No    Emotionally Abused: No    Physically Abused: Not on file    Sexually Abused: No      Family History   Problem Relation Age of Onset   Stroke Mother    Alzheimer's disease Mother    Heart attack Mother    Stroke Father    Diabetes Mellitus II Father     There were no vitals filed for this visit.  PHYSICAL EXAM: General:  Well appearing. No respiratory difficulty HEENT: normal Neck: supple. no JVD. Carotids 2+ bilat; no bruits. No lymphadenopathy or thryomegaly appreciated. Cor: PMI nondisplaced. Regular rate & rhythm. No rubs, gallops or murmurs. Lungs: clear Abdomen: soft, nontender, nondistended. No hepatosplenomegaly. No bruits or masses. Good bowel sounds. Extremities: no cyanosis, clubbing, rash, edema Neuro: alert & oriented x 3, cranial nerves grossly intact. moves all 4 extremities w/o difficulty. Affect pleasant.  ECG:   ASSESSMENT & PLAN: 1. Shock: Resolved. Patient was on NE x 3 days post-op.  He has a wide pulse pressure likely due to his moderate AI.  BP is very stable off NE, SBP 100-110s.  Echo is stable compared to prior, EF 30-35%, mild RV dysfunction, ascending aorta 56 mm with moderate AI.  Prior echo similar with EF 35-40%, moderate AI.  - Would aim for systolic BP >/= 100 rather than using MAP.   2. Chronic systolic CHF: Nonischemic CMP.  Echo showed EF 30-35%, mild RV dysfunction, ascending aorta 56 mm with moderate AI. Nonischemic cardiomyopathy, coronary CTA in 7/25 showed extensive but nonobstructive CAD.  Cardiomyopathy may be due to permanent AF, ?elevated HR at times.Would like to keep HR better-controlled given concern for tachy-mediated CMP.  Has actually been in NSR on amiodarone .  He does not look significantly volume overloaded. I do not think he has BP room to titrate GDMT (limited by orthostasis).  - Continue torsemide  20 mg daily - Continue Toprol  XL 12.5 mg BID.  - Continue spironolactone  to 25 mg daily.   - Continue Farxiga  10 mg daily - He thinks he could do a cardiac MRI, I will order today.  3. Atrial fibrillation: Though to be permanent.  Did  not cardiovert to NSR when shocked for SVT. ECGs 9/2 and 9/3 showed NSR with PACs and PVCs. Pulse irregular again today.  - Will get  ECG today to check rhythm.  - Continue amiodarone  200 mg bid. Transition to 200 mg daily at discharge.  - Continue Toprol  XL.   - Continue Xarelto .  4. SVT: Patient had rapid SVT post-op, required DCCV.  Now on amiodarone .  - Would continue amiodarone  200 mg bid for now, transition to daily at discharge.  5. CAD: Nonobstructive but extensive CAD on coronary CTA.  - Continue statin.  - No ASA with anticoagulation.  6. AKI: Resolved. Creatinine up to 1.6 initially.  Now down to 1.0. 7. Dilated aortic root/ascending aorta with moderate AI: 56 mm ascending aorta on echo, this is roughly stable from last CTA.   - Close followup with TCTS as outpatient.  - Moderate AI is likely causing the wide pulse pressure.  8. S/p decompression L1, fusion T10-L4: Per neurosurgery.  Needs PT.  - Graded compression stockings to help with orthostasis.

## 2023-10-02 ENCOUNTER — Ambulatory Visit (HOSPITAL_COMMUNITY): Payer: Self-pay | Admitting: Family Medicine

## 2023-10-02 ENCOUNTER — Ambulatory Visit (HOSPITAL_COMMUNITY)
Admit: 2023-10-02 | Discharge: 2023-10-02 | Disposition: A | Discharge status: Home / Self Care | Source: Ambulatory Visit | Attending: Family Medicine | Admitting: Family Medicine

## 2023-10-02 ENCOUNTER — Encounter (HOSPITAL_COMMUNITY): Payer: Self-pay

## 2023-10-02 VITALS — BP 110/42 | HR 69 | Ht 70.0 in | Wt 158.0 lb

## 2023-10-02 DIAGNOSIS — Z79899 Other long term (current) drug therapy: Secondary | ICD-10-CM | POA: Insufficient documentation

## 2023-10-02 DIAGNOSIS — I471 Supraventricular tachycardia, unspecified: Secondary | ICD-10-CM | POA: Diagnosis not present

## 2023-10-02 DIAGNOSIS — Z7984 Long term (current) use of oral hypoglycemic drugs: Secondary | ICD-10-CM | POA: Insufficient documentation

## 2023-10-02 DIAGNOSIS — I7121 Aneurysm of the ascending aorta, without rupture: Secondary | ICD-10-CM

## 2023-10-02 DIAGNOSIS — I2510 Atherosclerotic heart disease of native coronary artery without angina pectoris: Secondary | ICD-10-CM

## 2023-10-02 DIAGNOSIS — I252 Old myocardial infarction: Secondary | ICD-10-CM | POA: Insufficient documentation

## 2023-10-02 DIAGNOSIS — G4733 Obstructive sleep apnea (adult) (pediatric): Secondary | ICD-10-CM | POA: Insufficient documentation

## 2023-10-02 DIAGNOSIS — N1831 Chronic kidney disease, stage 3a: Secondary | ICD-10-CM | POA: Insufficient documentation

## 2023-10-02 DIAGNOSIS — I4819 Other persistent atrial fibrillation: Secondary | ICD-10-CM

## 2023-10-02 DIAGNOSIS — Z8679 Personal history of other diseases of the circulatory system: Secondary | ICD-10-CM | POA: Insufficient documentation

## 2023-10-02 DIAGNOSIS — Z86711 Personal history of pulmonary embolism: Secondary | ICD-10-CM | POA: Insufficient documentation

## 2023-10-02 DIAGNOSIS — I351 Nonrheumatic aortic (valve) insufficiency: Secondary | ICD-10-CM

## 2023-10-02 DIAGNOSIS — I130 Hypertensive heart and chronic kidney disease with heart failure and stage 1 through stage 4 chronic kidney disease, or unspecified chronic kidney disease: Secondary | ICD-10-CM | POA: Insufficient documentation

## 2023-10-02 DIAGNOSIS — I5022 Chronic systolic (congestive) heart failure: Secondary | ICD-10-CM

## 2023-10-02 DIAGNOSIS — I77810 Thoracic aortic ectasia: Secondary | ICD-10-CM | POA: Insufficient documentation

## 2023-10-02 DIAGNOSIS — Z7901 Long term (current) use of anticoagulants: Secondary | ICD-10-CM | POA: Insufficient documentation

## 2023-10-02 DIAGNOSIS — I429 Cardiomyopathy, unspecified: Secondary | ICD-10-CM | POA: Diagnosis not present

## 2023-10-02 DIAGNOSIS — I4821 Permanent atrial fibrillation: Secondary | ICD-10-CM | POA: Diagnosis not present

## 2023-10-02 LAB — BASIC METABOLIC PANEL WITH GFR
Anion gap: 11 (ref 5–15)
BUN: 32 mg/dL — ABNORMAL HIGH (ref 8–23)
CO2: 25 mmol/L (ref 22–32)
Calcium: 9.2 mg/dL (ref 8.9–10.3)
Chloride: 100 mmol/L (ref 98–111)
Creatinine, Ser: 1.34 mg/dL — ABNORMAL HIGH (ref 0.61–1.24)
GFR, Estimated: 55 mL/min — ABNORMAL LOW (ref >60)
Glucose, Bld: 110 mg/dL — ABNORMAL HIGH (ref 70–99)
Potassium: 4.5 mmol/L (ref 3.5–5.1)
Sodium: 136 mmol/L (ref 135–145)

## 2023-10-02 LAB — CBC
HCT: 33.7 % — ABNORMAL LOW (ref 39.0–52.0)
Hemoglobin: 10.7 g/dL — ABNORMAL LOW (ref 13.0–17.0)
MCH: 30.1 pg (ref 26.0–34.0)
MCHC: 31.8 g/dL (ref 30.0–36.0)
MCV: 94.9 fL (ref 80.0–100.0)
Platelets: 296 K/uL (ref 150–400)
RBC: 3.55 MIL/uL — ABNORMAL LOW (ref 4.22–5.81)
RDW: 14.6 % (ref 11.5–15.5)
WBC: 10.5 K/uL (ref 4.0–10.5)
nRBC: 0 % (ref 0.0–0.2)

## 2023-10-02 MED ORDER — AMIODARONE HCL 200 MG PO TABS: 200.0000 mg | ORAL_TABLET | Freq: Every day | ORAL | 3 refills | Status: AC

## 2023-10-02 NOTE — Patient Instructions (Signed)
 STOP Entresto   DECREASE Amiodarone  to 200 mg daily.  Labs done today, your results will be available in MyChart, we will contact you for abnormal readings.  Your physician recommends that you schedule a follow-up appointment in: as scheduled.  If you have any questions or concerns before your next appointment please send us  a message through [place] or call our office at [phone].    TO LEAVE A MESSAGE FOR THE NURSE SELECT OPTION 2, PLEASE LEAVE A MESSAGE INCLUDING: YOUR NAME DATE OF BIRTH CALL BACK NUMBER REASON FOR CALL**this is important as we prioritize the call backs  YOU WILL RECEIVE A CALL BACK THE SAME DAY AS LONG AS YOU CALL BEFORE 4:00 PM  At the [facility], you and your health needs are our priority. As part of our continuing mission to provide you with exceptional heart care, we have created designated Provider Care Teams. These Care Teams include your primary Cardiologist (physician) and Advanced Practice Providers (APPs- Physician Assistants and Nurse Practitioners) who all work together to provide you with the care you need, when you need it.   You may see any of the following providers on your designated Care Team at your next follow up: Dr Toribio Fuel Dr Ezra Shuck Dr. Ria Commander Dr. Morene Brownie Amy Lenetta, NP Caffie Shed, [place] [facility] Wallace Ridge, [place] Beckey Coe, NP [place] Lee, NP Ellouise Class, NP Tinnie Redman, PharmD Jaun Bash, PharmD   Please be sure to bring in all your medications bottles to every appointment.    Thank you for choosing [facility]

## 2023-10-06 ENCOUNTER — Other Ambulatory Visit: Payer: Self-pay | Admitting: Internal Medicine

## 2023-10-07 DIAGNOSIS — G4733 Obstructive sleep apnea (adult) (pediatric): Secondary | ICD-10-CM | POA: Diagnosis not present

## 2023-10-10 ENCOUNTER — Encounter: Admitting: Physical Medicine and Rehabilitation

## 2023-10-14 ENCOUNTER — Ambulatory Visit: Admitting: Internal Medicine

## 2023-10-14 ENCOUNTER — Encounter (HOSPITAL_COMMUNITY): Payer: Self-pay | Admitting: Physician Assistant

## 2023-10-14 ENCOUNTER — Encounter: Payer: Self-pay | Admitting: Internal Medicine

## 2023-10-14 VITALS — BP 132/52 | HR 69 | Temp 98.0°F | Resp 18 | Ht 70.0 in | Wt 166.0 lb

## 2023-10-14 DIAGNOSIS — S32001D Stable burst fracture of unspecified lumbar vertebra, subsequent encounter for fracture with routine healing: Secondary | ICD-10-CM | POA: Diagnosis not present

## 2023-10-14 DIAGNOSIS — G8918 Other acute postprocedural pain: Secondary | ICD-10-CM | POA: Insufficient documentation

## 2023-10-14 MED ORDER — TORSEMIDE 10 MG PO TABS: 10.0000 mg | ORAL_TABLET | Freq: Two times a day (BID) | ORAL | 2 refills | Status: DC

## 2023-10-14 MED ORDER — TRAMADOL HCL 50 MG PO TABS: 100.0000 mg | ORAL_TABLET | Freq: Three times a day (TID) | ORAL | 0 refills | Status: AC | PRN

## 2023-10-14 NOTE — Progress Notes (Signed)
 Called to confirm/remind patient of their appointment at the [facility] on 10/14/23 .   Appointment:   [] Confirmed  [x] Left mess   [] No answer/No voice mail  [] VM Full/unable to leave message  [] Phone not in service  Patient reminded to bring all medications and/or complete list.  Confirmed patient has transportation. Gave directions, instructed to utilize valet parking.

## 2023-10-14 NOTE — Assessment & Plan Note (Signed)
 He has postoperative/chronic pain as described.  I will see him back in 1 month.

## 2023-10-14 NOTE — Assessment & Plan Note (Addendum)
 He had a lumbar radiculopathy where he is s/p surgery.  He will finish his antibiotics and there is no infection going on today.  His back wound is healing well.  He will followup with neurosurgery for her peroneal nerve procedure.  He has been on tramadol  in the past for chronic pain.  I will refill his tramadol  and I want him to start taking it as tramadol  50mg  po q 8 hrs prn and take tylenol  500 mg po q 8 hours scheduled for the next 2 weeks.  He can take the extra tramadol  50mg  every 8 hours for a total of 100mg  if needed.

## 2023-10-14 NOTE — Progress Notes (Signed)
 Office Visit  Subjective   Patient ID: Aaron Richmond   DOB: 1945/05/16   Age: 78 y.o.   MRN: [id number]   Chief Complaint Chief Complaint  Patient presents with   Follow-up    3 Month follow up     History of Present Illness Aaron Richmond is a 78 yo male who comes in today for a hospital followup.  He was admitted to [facility] from 8/27 until 9/3 to [facility] and was then discharged to inpatient rehab where he was there from 9/3 until 9/17.  He presented to neurosurgery on 09/03/2023 with back and right leg numbness tingling and leg.  Workup revealed severe cord compression at L1 marked spondylosis at that level recommendations of transpedicular decompression of L1 posterior lateral instrumented fusion.  Patient underwent T10-L4 fusion by Dr. Onetha 09/03/2023 with posterior lateral arthrodesis.  Placed in a lumbar corset applied in sitting position.  Hospital course developed narrow complex tachycardia with hypotension, initially failed cardioversion attempts but later converted to atrial fibrillation RVR with 300  J shock with improved pressures followed by cardiology services.  Patient treated with amiodarone  and pressors for hypotension.  Initially Xarelto  held for procedure transition from IV heparin  back to Xarelto  at the recommendations of neurosurgery.  Hospital course acute blood loss anemia 7.8 monitored.  History of right foot drop with right lower extremity AFO.  RTherapy evaluations completed due to patient decreased functional mobility was admitted for comprehensive rehab program.  He was sent to Inpatient rehab and was admitted to rehab 09/10/2023 for inpatient therapies to consist of PT, ST and OT at least three hours five days a week.  They noted at his surgical site with some mild erythema and superficial dehiscence and placed on doxycycline  after follow-up per neurosurgery. .   Pain control with the use of tramadol /hydrocodone  as needed as well as Robaxin  for muscle spasms.  History of  atrial fibrillation transition from IV heparin  back to chronic Xarelto .  Cardiac rate controlled continue amiodarone  as well as metoprolol  and follow-up cardiology services.  Acute blood loss anemia stable no bleeding episodes.  Chronic systolic congestive heart failure Demadex  and Aldactone  as advised with Farxiga .  Patient had no signs of fluid overload.  CKD stage III follow-up monitoring of chemistries.  He did followup as an outpatient with neurosurgery on 10/07/2023 where again he was having some wound healing issues at the top of his incision. This have low down with drainage.  They felt it looked better than it did 2 weeks prior. His pain is tolerable and expected. They felt his pain was improving.  He has a known right peroneal neuropathy shown on his EMG with a right footdrop. He would like to move forward with getting this fixed and they are getting him arrnaged for surgery.  He has not started outpatient therapies yet and are waiting on this peroneal nerve surgery.  The patient states he is on tramadol  50mg  po TID prn.         Past Medical History Past Medical History:  Diagnosis Date   Aneurysm of aortic root    Overview:  Last Assessment & Plan:  Planning to see Dr Army for evaluation of 5cm aneurysm.  - will check PFT in prep for possible procedure / SGY   Aortic atherosclerosis 06/14/2022   Aortic regurgitation    Aortic valve insufficiency    Ascending aortic aneurysm 09/11/2016   Atherosclerotic vascular disease 05/27/2022   Atrial fibrillation (HCC)  Atrial fibrillation with RVR (HCC) 07/05/2022   BMI 27.0-27.9,adult 06/14/2022   BPH (benign prostatic hyperplasia)    BRBPR (bright red blood per rectum) 05/20/2023   Cardiomyopathy (HCC) 09/11/2016   CHF (congestive heart failure) (HCC)    CKD (chronic kidney disease) stage 2, GFR 60-89 ml/min 11/23/2021   Closed fracture of right hip (HCC) 03/10/2023   Coronary artery calcification seen on CT scan 10/13/2017    Dysrhythmia    Essential hypertension 06/14/2022   Gastrointestinal hemorrhage 05/20/2023   Heart murmur    High risk medication use 07/31/2022   History of cardiomyopathy 11/09/2021   History of pulmonary embolism 09/24/2012   Overview:  Last Assessment & Plan:  Hx PE x 2 per notes, ? 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  - needs auto-titration study by [employer] or local company, data to RB to adjust his home device.    OSA (obstructive sleep apnea) 09/13/2022   Prediabetes    Primary osteoarthritis involving multiple joints 06/14/2022   Prostate cancer metastatic to bone (HCC) 06/14/2022   Pulmonary embolism (HCC)    Rhinitis    Sacral wound 04/11/2023   Seasonal allergies 06/14/2022   Spondylolisthesis of lumbar region 12/12/2021   Stage 3a chronic kidney disease (HCC) 06/14/2022   Unstageable pressure ulcer of sacral region (HCC) 03/10/2023     Allergies Allergies  Allergen Reactions   Cipro [Ciprofloxacin Hcl] Other (See Comments)    Increased risk of rupture  of ascending thoracic aortic aneurysm     Medications  Current Outpatient Medications:    amiodarone  (PACERONE ) 200 MG tablet, Take 1 tablet (200 mg total) by mouth daily., Disp: 90 tablet, Rfl: 3   bisacodyl  (DULCOLAX) 10 MG suppository, Place 1 suppository (10 mg total) rectally daily., Disp: , Rfl:    Cyanocobalamin  (B-12) 5000 MCG CAPS, Take 1  capsule by mouth daily at 12 noon., Disp: 30 capsule, Rfl: 0   dapagliflozin  propanediol (FARXIGA ) 10 MG TABS tablet, Take 1 tablet (10 mg total) by mouth daily., Disp: 30 tablet, Rfl: 0   doxycycline  (VIBRA -TABS) 100 MG tablet, Take 1 tablet (100 mg total) by mouth every 12 (twelve) hours., Disp: 6 tablet, Rfl: 0   methocarbamol  (ROBAXIN ) 500 MG tablet, Take 1 tablet (500 mg total) by mouth 4 (four) times daily., Disp: 120 tablet, Rfl: 0   metoprolol  succinate (TOPROL -XL) 25 MG 24 hr tablet, Take 0.5 tablets (12.5 mg total) by mouth 2 (two) times daily., Disp: 60 tablet, Rfl: 0   Multiple Vitamins-Minerals (MENS 50+ MULTIVITAMIN) TABS, Take 1 tablet by mouth every evening., Disp: , Rfl:    pantoprazole  (PROTONIX ) 40 MG tablet, Take 1 tablet (40 mg total) by mouth daily., Disp: 30 tablet, Rfl: 0   polyethylene glycol (MIRALAX  / GLYCOLAX ) 17 g packet, Take 17 g by mouth daily., Disp: , Rfl:    potassium chloride  SA (KLOR-CON  M20) 20 MEQ tablet, Take 1 tablet (20 mEq total) by  mouth daily., Disp: 30 tablet, Rfl: 0   Rivaroxaban  (XARELTO ) 15 MG TABS tablet, Take 1 tablet (15 mg total) by mouth daily with supper., Disp: 42 tablet, Rfl: 0   rosuvastatin  (CRESTOR ) 10 MG tablet, Take 1 tablet (10 mg total) by mouth daily., Disp: 30 tablet, Rfl: 0   tamsulosin  (FLOMAX ) 0.4 MG CAPS capsule, Take 1 capsule (0.4 mg total) by mouth daily., Disp: 30 capsule, Rfl: 0   traMADol  (ULTRAM ) 50 MG tablet, Take 2 tablets (100 mg total) by mouth every 8 (eight) hours as needed for moderate pain (pain score 4-6)., Disp: 30 tablet, Rfl: 0   Review of Systems Review of Systems  Constitutional:  Negative for chills and fever.  Respiratory:  Negative for shortness of breath.   Cardiovascular:  Negative for chest pain, palpitations and leg swelling.  Gastrointestinal:  Negative for abdominal pain, constipation, diarrhea, nausea and vomiting.  Neurological:  Negative for dizziness, weakness and headaches.        Objective:    Vitals BP (!) 132/52   Pulse 69   Temp 98 F (36.7 C) (Temporal)   Resp 18   Ht 5' 10 (1.778 m)   Wt 166 lb (75.3 kg)   SpO2 97%   BMI 23.82 kg/m    Physical Examination Physical Exam Constitutional:      Appearance: Normal appearance. He is not ill-appearing.  Cardiovascular:     Rate and Rhythm: Normal rate and regular rhythm.     Pulses: Normal pulses.     Heart sounds: No murmur heard.    No friction rub. No gallop.  Pulmonary:     Effort: Pulmonary effort is normal. No respiratory distress.     Breath sounds: No wheezing, rhonchi or rales.  Abdominal:     General: Abdomen is flat. Bowel sounds are normal. There is no distension.     Palpations: Abdomen is soft.     Tenderness: There is no abdominal tenderness.  Musculoskeletal:     Right lower leg: No edema.     Left lower leg: No edema.  Skin:    General: Skin is warm and dry.     Findings: No rash.     Comments: He has a longtitudinal healed incision of his mid back with some brusing to the periphery but no erythema, increased warmth or drainage.  The incision line is healing well.  Neurological:     Mental Status: He is alert.        Assessment & Plan:   Lumbar burst fracture (HCC) He had a lumbar radiculopathy where he is s/p surgery.  He will finish his antibiotics and there is no infection going on today.  His back wound is healing well.  He will followup with neurosurgery for her peroneal nerve procedure.  He has been on tramadol  in the past for chronic pain.  I will refill his tramadol  and I want him to start taking it as tramadol  50mg  po q 8 hrs prn and take tylenol  500 mg po q 8 hours scheduled for the next 2 weeks.  He can take the extra tramadol  50mg  every 8 hours for a total of 100mg  if needed.    Post-operative pain He has postoperative/chronic pain as described.  I will see him back in 1 month.    Return in about 4 weeks (around 11/11/2023).   Selinda Fleeta Finger, MD

## 2023-10-15 ENCOUNTER — Encounter (HOSPITAL_COMMUNITY): Payer: Self-pay

## 2023-10-15 ENCOUNTER — Ambulatory Visit (HOSPITAL_COMMUNITY)
Admission: RE | Admit: 2023-10-15 | Discharge: 2023-10-15 | Disposition: A | Discharge status: Home / Self Care | Source: Ambulatory Visit | Attending: Physician Assistant | Admitting: Physician Assistant

## 2023-10-15 VITALS — BP 128/50 | HR 71 | Ht 70.0 in | Wt 166.0 lb

## 2023-10-15 DIAGNOSIS — I480 Paroxysmal atrial fibrillation: Secondary | ICD-10-CM | POA: Diagnosis not present

## 2023-10-15 DIAGNOSIS — I2510 Atherosclerotic heart disease of native coronary artery without angina pectoris: Secondary | ICD-10-CM | POA: Diagnosis not present

## 2023-10-15 DIAGNOSIS — I4821 Permanent atrial fibrillation: Secondary | ICD-10-CM | POA: Diagnosis not present

## 2023-10-15 DIAGNOSIS — Z7984 Long term (current) use of oral hypoglycemic drugs: Secondary | ICD-10-CM | POA: Diagnosis not present

## 2023-10-15 DIAGNOSIS — Z86711 Personal history of pulmonary embolism: Secondary | ICD-10-CM | POA: Diagnosis not present

## 2023-10-15 DIAGNOSIS — I252 Old myocardial infarction: Secondary | ICD-10-CM | POA: Diagnosis not present

## 2023-10-15 DIAGNOSIS — N182 Chronic kidney disease, stage 2 (mild): Secondary | ICD-10-CM | POA: Insufficient documentation

## 2023-10-15 DIAGNOSIS — I5022 Chronic systolic (congestive) heart failure: Secondary | ICD-10-CM | POA: Diagnosis not present

## 2023-10-15 DIAGNOSIS — M21371 Foot drop, right foot: Secondary | ICD-10-CM | POA: Diagnosis not present

## 2023-10-15 DIAGNOSIS — Z79899 Other long term (current) drug therapy: Secondary | ICD-10-CM | POA: Diagnosis not present

## 2023-10-15 DIAGNOSIS — I351 Nonrheumatic aortic (valve) insufficiency: Secondary | ICD-10-CM | POA: Diagnosis not present

## 2023-10-15 DIAGNOSIS — I471 Supraventricular tachycardia, unspecified: Secondary | ICD-10-CM | POA: Insufficient documentation

## 2023-10-15 DIAGNOSIS — Z8679 Personal history of other diseases of the circulatory system: Secondary | ICD-10-CM | POA: Diagnosis not present

## 2023-10-15 DIAGNOSIS — I130 Hypertensive heart and chronic kidney disease with heart failure and stage 1 through stage 4 chronic kidney disease, or unspecified chronic kidney disease: Secondary | ICD-10-CM | POA: Diagnosis not present

## 2023-10-15 DIAGNOSIS — G4733 Obstructive sleep apnea (adult) (pediatric): Secondary | ICD-10-CM | POA: Insufficient documentation

## 2023-10-15 DIAGNOSIS — I77810 Thoracic aortic ectasia: Secondary | ICD-10-CM | POA: Diagnosis not present

## 2023-10-15 DIAGNOSIS — I428 Other cardiomyopathies: Secondary | ICD-10-CM | POA: Diagnosis not present

## 2023-10-15 DIAGNOSIS — Z7901 Long term (current) use of anticoagulants: Secondary | ICD-10-CM | POA: Diagnosis not present

## 2023-10-15 DIAGNOSIS — I7121 Aneurysm of the ascending aorta, without rupture: Secondary | ICD-10-CM

## 2023-10-15 MED ORDER — POTASSIUM CHLORIDE CRYS ER 20 MEQ PO TBCR: 20.0000 meq | EXTENDED_RELEASE_TABLET | Freq: Every day | ORAL | 5 refills | Status: DC

## 2023-10-15 MED ORDER — SPIRONOLACTONE 25 MG PO TABS: 25.0000 mg | ORAL_TABLET | Freq: Every day | ORAL | 3 refills | Status: DC

## 2023-10-15 MED ORDER — METOPROLOL SUCCINATE ER 25 MG PO TB24: 12.5000 mg | ORAL_TABLET | Freq: Two times a day (BID) | ORAL | 5 refills | Status: AC

## 2023-10-15 MED ORDER — ROSUVASTATIN CALCIUM 10 MG PO TABS: 10.0000 mg | ORAL_TABLET | Freq: Every day | ORAL | 5 refills | Status: AC

## 2023-10-15 MED ORDER — RIVAROXABAN 15 MG PO TABS: 15.0000 mg | ORAL_TABLET | Freq: Every day | ORAL | 5 refills | Status: DC

## 2023-10-15 MED ORDER — DAPAGLIFLOZIN PROPANEDIOL 10 MG PO TABS: 10.0000 mg | ORAL_TABLET | Freq: Every day | ORAL | 5 refills | Status: DC

## 2023-10-15 NOTE — Addendum Note (Signed)
 Encounter addended by: Colletta Manuelita Garre, PA-C on: 10/15/2023 4:35 PM  Actions taken: Clinical Note Signed

## 2023-10-15 NOTE — Progress Notes (Addendum)
 ADVANCED HF CLINIC NOTE   Primary Care: Fleeta Valeria Mayo, MD Primary Cardiologist: Dr. Monetta HF Cardiologist: Dr. Rolan  HPI: 78 y.o. male with history of permanent AF, HFrEF, hx thoracic aortic aneurysm followed by TCTS, and nonobstructive CAD (coronary CTA with nonobstructive disease in LAD and diagonal branches, calcium  score 1959). Other history includes severe central and obstructive sleep apnea on BiPAP, remote hx PE, prostate cancer w/ mets to bone.  He follows with Dr. Monetta in [place].   EF was 50-55% by echo in 10/23. EF slightly lower at 40-45% on echo at [facility] 06/24. Most recent echo 06/25 with EF 35-40%, RV okay, severe LAE, mild to moderate MR, moderate AI, ascending aorta measures 56 mm.   He presented 08/14/23 for bilateral transpedicular decompression of L1 for L1 burst fracture with cord compression. Post op had unstable SVT and required pressor support. Cardiology evaluated patient and he failed cardioversion X 2. EP then called. He failed adenosine 6 and 12, and another cardioversion. Started on IV amiodarone  and converted to AF with RVR after 360 J shock with [place]. He was transferred to the ICU on pressor support and IV amiodarone . He had no recurrent rhythm issues and was later switched to po amiodarone .  Had difficulty titrating NE due to low BP. Advanced Heart Failure asked to see to assist with management. He was diuresed and GDMT titrated. He was discharged to CIR, weight 167 lbs.  Seen for follow-up 10/02/23. Entresto  stopped d/t orthostasis. Here today for 3 week CHF follow-up. Doing well from HF standpoint. No shortness of breath, orthopnea, PND or lower extremity edema. Mobility slowly improving after spinal surgery. Still has foot drop on the right. Ambulating with a walker. Overall feels like he has more energy off Entresto . Weight up 8 lb from last visit but he has been purposefully increasing caloric intake.  Would like to stop taking spironolactone  if  possible given that he does not have any signs of fluid retention. Gets lightheaded when he first gets out of bed in the morning but this does not persist throughout the day. No presyncope or syncope. No change with stopping entresto .   Cardiac Studies - cMRI 9/25: LVEF 40%, RVEF 37%, severe AI, subendocardial LGE in basal inferolateral wall consistent with small prior MI.  - Echo 8/25: EF 30-35%, RV mildly reduced, moderate AI, ascending aorta 56 mm  - Echo 6/25: EF 35-40%, RV ok, mild to moderate MR, moderate AI   Past Medical History:  Diagnosis Date   Aneurysm of aortic root    Overview:  Last Assessment & Plan:  Planning to see Dr Army for evaluation of 5cm aneurysm.  - will check PFT in prep for possible procedure / SGY   Aortic atherosclerosis 06/14/2022   Aortic regurgitation    Aortic valve insufficiency    Ascending aortic aneurysm 09/11/2016   Atherosclerotic vascular disease 05/27/2022   Atrial fibrillation (HCC)    Atrial fibrillation with RVR (HCC) 07/05/2022   BMI 27.0-27.9,adult 06/14/2022   BPH (benign prostatic hyperplasia)    BRBPR (bright red blood per rectum) 05/20/2023   Cardiomyopathy (HCC) 09/11/2016   CHF (congestive heart failure) (HCC)    CKD (chronic kidney disease) stage 2, GFR 60-89 ml/min 11/23/2021   Closed fracture of right hip (HCC) 03/10/2023   Coronary artery calcification seen on CT scan 10/13/2017   Dysrhythmia    Essential hypertension 06/14/2022   Gastrointestinal hemorrhage 05/20/2023   Heart murmur    High risk medication use 07/31/2022  History of cardiomyopathy 11/09/2021   History of pulmonary embolism 09/24/2012   Overview:  Last Assessment & Plan:  Hx PE x 2 per notes, ? 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  - needs auto-titration study by [employer] or local company, data to RB to adjust his home device.    OSA (obstructive sleep apnea) 09/13/2022   Prediabetes    Primary osteoarthritis involving multiple joints 06/14/2022   Prostate cancer metastatic to bone (HCC) 06/14/2022   Pulmonary embolism (HCC)    Rhinitis    Sacral wound 04/11/2023   Seasonal allergies 06/14/2022   Spondylolisthesis of lumbar region 12/12/2021   Stage 3a chronic kidney disease (HCC) 06/14/2022   Unstageable pressure ulcer of sacral region (HCC) 03/10/2023    Current Outpatient Medications  Medication Sig Dispense Refill   amiodarone  (PACERONE ) 200 MG tablet Take 1 tablet (200 mg total) by mouth daily. 90 tablet 3   bisacodyl  (DULCOLAX) 10 MG suppository Place 1 suppository (10 mg total) rectally daily.     Cyanocobalamin  (B-12) 5000 MCG CAPS Take 1 capsule by mouth daily at 12 noon. 30 capsule 0   doxycycline  (VIBRA -TABS) 100 MG tablet Take 1 tablet (100 mg total) by mouth every 12 (twelve) hours. 6 tablet 0   methocarbamol  (ROBAXIN ) 500 MG tablet Take 1 tablet (500 mg total) by mouth 4 (four) times daily. 120 tablet 0   Multiple Vitamins-Minerals (MENS 50+ MULTIVITAMIN) TABS Take 1 tablet by mouth every evening.     pantoprazole  (PROTONIX ) 40 MG tablet Take 1 tablet (40 mg total) by mouth daily. 30 tablet 0   polyethylene glycol (MIRALAX  / GLYCOLAX ) 17 g packet Take 17 g by mouth daily.     spironolactone  (ALDACTONE ) 25 MG tablet Take 1 tablet (25 mg total) by mouth daily. 90 tablet 3   tamsulosin  (FLOMAX ) 0.4 MG CAPS capsule Take 1 capsule (0.4 mg total) by mouth  daily. 30 capsule 0   torsemide  (DEMADEX ) 10 MG tablet Take 1 tablet (10 mg total) by mouth 2 (two) times daily. 60 tablet 2   traMADol  (ULTRAM ) 50 MG tablet Take 2 tablets (100 mg total) by mouth every 8 (eight) hours as needed for moderate pain (pain score 4-6). 180 tablet 0   dapagliflozin  propanediol (FARXIGA ) 10 MG TABS tablet Take 1 tablet (10 mg total) by mouth daily. 30 tablet 5   metoprolol  succinate (TOPROL -XL) 25 MG 24 hr tablet Take 0.5 tablets (12.5 mg total) by mouth 2 (two) times daily. 60 tablet 5   potassium chloride  SA (KLOR-CON  M20) 20 MEQ tablet Take 1 tablet (20 mEq total) by mouth daily. 30 tablet 5   Rivaroxaban  (XARELTO ) 15 MG TABS tablet Take 1 tablet (15 mg total) by mouth daily with supper. 30 tablet 5   rosuvastatin  (CRESTOR ) 10 MG tablet Take 1 tablet (10 mg total) by mouth daily. 30 tablet 5   No current facility-administered medications for this encounter.  Allergies  Allergen Reactions   Cipro [Ciprofloxacin Hcl] Other (See Comments)    Increased risk of rupture  of ascending thoracic aortic aneurysm   Social History   Socioeconomic History   Marital status: Married    Spouse name: Not on file   Number of children: 0   Years of education: Not on file   Highest education level: Not on file  Occupational History   Occupation: Chief Operating Officer  Tobacco Use   Smoking status: Never   Smokeless tobacco: Never  Vaping Use   Vaping status: Never Used  Substance and Sexual Activity   Alcohol  use: No   Drug use: No   Sexual activity: Not on file  Other Topics Concern   Not on file  Social History Narrative   Not on file   Social Drivers of Health   Financial Resource Strain: Not on file  Food Insecurity: No Food Insecurity (09/03/2023)   Hunger Vital Sign    Worried About Running Out of Food in the Last Year: Never true    Ran Out of Food in the Last Year: Never true  Transportation Needs: No Transportation Needs (09/03/2023)   PRAPARE -  Administrator, Civil Service (Medical): No    Lack of Transportation (Non-Medical): No  Physical Activity: Not on file  Stress: Not on file  Social Connections: Socially Integrated (09/03/2023)   Social Connection and Isolation Panel    Frequency of Communication with Friends and Family: More than three times a week    Frequency of Social Gatherings with Friends and Family: Once a week    Attends Religious Services: More than 4 times per year    Active Member of [employer] or Organizations: Yes    Attends Banker Meetings: 1 to 4 times per year    Marital Status: Married  Catering manager Violence: Unknown (09/03/2023)   Humiliation, Afraid, Rape, and Kick questionnaire    Fear of Current or Ex-Partner: No    Emotionally Abused: No    Physically Abused: Not on file    Sexually Abused: No   Family History  Problem Relation Age of Onset   Stroke Mother    Alzheimer's disease Mother    Heart attack Mother    Stroke Father    Diabetes Mellitus II Father    Wt Readings from Last 3 Encounters:  10/15/23 75.3 kg (166 lb)  10/14/23 75.3 kg (166 lb)  10/02/23 71.7 kg (158 lb)  . BP (!) 128/50   Pulse 71   Ht 5' 10 (1.778 m)   Wt 75.3 kg (166 lb)   SpO2 96%   BMI 23.82 kg/m   PHYSICAL EXAM: General:  Elderly male. Arrived in wheelchair.  Cor: JVP 6-7. Regular rate & rhythm. 3/6 SEM RUSB Lungs: clear Abdomen: soft, nontender, nondistended.  Extremities: no edema Neuro: alert & orientedx3. Affect pleasant   ASSESSMENT & PLAN: 1. Chronic systolic CHF: Nonischemic CMP.  Echo showed EF 30-35%, mild RV dysfunction, ascending aorta 56 mm with moderate AI. Nonischemic cardiomyopathy, coronary CTA in 7/25 showed extensive but nonobstructive CAD.  Cardiomyopathy may be due to AF, ?elevated HR at times. Would like to keep HR better-controlled given concern for tachy-mediated CMP.  Has actually been in NSR on amiodarone  since early September. cMRI showed LVEF 40%, RVEF  37%, severe AI, subendocardial LGE in basal inferolateral wall consistent with small prior MI. NYHA III, confounded by spinal issues. He is not volume overloaded on exam. GDMT limited  by BP and orthostasis. - Unable to tolerate entresto  d/t orthostasis - Continue torsemide  20 mg daily. - Continue Toprol  XL 12.5 mg bid. Do not titrate further w/ severe AI. - Discussed rationale for continuing spironolactone , he agrees to remain on the medication - Continue Farxiga  10 mg daily. - Check echo at time of follow-up in 12/25. 2. Atrial fibrillation: Though to be permanent.  Did not cardiovert to NSR when shocked for SVT. ECGs 9/2 and 9/3 showed NSR with PACs and PVCs. NSR on ECG 9/25. Regular on exam today. - Continue amiodarone  200 mg daily. Follow LFTs and TSH, check at f/u visit. He will need  regular eye exam while on amiodarone . - Continue Toprol  XL.   - Continue Xarelto  15 mg daily. No bleeding issues.  3. SVT: Patient had rapid SVT post-op, required DCCV.  Now on amiodarone .  4. CAD: Nonobstructive but extensive CAD on coronary CTA. No chest pain - Continue rosuvastatin  10 mg daily.  - No ASA with anticoagulation.  5. Dilated aortic root/ascending aorta with severe AI: 56 mm ascending aorta on echo, this is roughly stable from last CTA.   - AI moderate on echo 8/25 - Reviewed recent cMRI 9/25 with Dr. Rolan. Arrange followup with TCTS given finding of severe AI. Sees Dr. Kerrin - Repeat echo in 12/25 6. S/p decompression L1, fusion T10-L4 - Mobility slowly improving.  Follow up: 2 months with Dr. Rolan with echo, sooner if needed pending TCTS evaluation  Manuelita Dutch, PA-C 10/15/23

## 2023-10-15 NOTE — Patient Instructions (Signed)
 No Labs done today.   RESTART Spironolactone .   No other medication changes were made. Please continue all current medications as prescribed.  Your physician recommends that you schedule a follow-up appointment in: December with an echo  Your physician has requested that you have an echocardiogram. Echocardiography is a painless test that uses sound waves to create images of your heart. It provides your doctor with information about the size and shape of your heart and how well your heart's chambers and valves are working. This procedure takes approximately one hour. There are no restrictions for this procedure. Please do NOT wear cologne, perfume, aftershave, or lotions (deodorant is allowed). Please arrive 15 minutes prior to your appointment time.  Please note: We ask at that you not bring children with you during ultrasound (echo/ vascular) testing. Due to room size and safety concerns, children are not allowed in the ultrasound rooms during exams. Our front office staff cannot provide observation of children in our lobby area while testing is being conducted. An adult accompanying a patient to their appointment will only be allowed in the ultrasound room at the discretion of the ultrasound technician under special circumstances. We apologize for any inconvenience.  If you have any questions or concerns before your next appointment please send us  a message through [place] or call our office at [phone].    TO LEAVE A MESSAGE FOR THE NURSE SELECT OPTION 2, PLEASE LEAVE A MESSAGE INCLUDING: YOUR NAME DATE OF BIRTH CALL BACK NUMBER REASON FOR CALL**this is important as we prioritize the call backs  YOU WILL RECEIVE A CALL BACK THE SAME DAY AS LONG AS YOU CALL BEFORE 4:00 PM   Do the following things EVERYDAY: Weigh yourself in the morning before breakfast. Write it down and keep it in a log. Take your medicines as prescribed Eat low salt foods--Limit salt (sodium) to 2000 mg per day.   Stay as active as you can everyday Limit all fluids for the day to less than 2 liters   At the [facility], you and your health needs are our priority. As part of our continuing mission to provide you with exceptional heart care, we have created designated Provider Care Teams. These Care Teams include your primary Cardiologist (physician) and Advanced Practice Providers (APPs- Physician Assistants and Nurse Practitioners) who all work together to provide you with the care you need, when you need it.   You may see any of the following providers on your designated Care Team at your next follow up: Dr Toribio Fuel Dr Ezra Shuck Dr. Ria Gardenia Greig Lenetta, NP Caffie Shed, [place] [facility] [place], [place] Beckey Coe, NP Tinnie Redman, PharmD   Please be sure to bring in all your medications bottles to every appointment.    Thank you for choosing [facility]

## 2023-10-26 ENCOUNTER — Other Ambulatory Visit: Payer: Self-pay | Admitting: Internal Medicine

## 2023-10-28 DIAGNOSIS — S22070A Wedge compression fracture of T9-T10 vertebra, initial encounter for closed fracture: Secondary | ICD-10-CM | POA: Diagnosis not present

## 2023-10-28 DIAGNOSIS — T8189XD Other complications of procedures, not elsewhere classified, subsequent encounter: Secondary | ICD-10-CM | POA: Diagnosis not present

## 2023-10-29 ENCOUNTER — Ambulatory Visit (INDEPENDENT_AMBULATORY_CARE_PROVIDER_SITE_OTHER)
Admission: RE | Admit: 2023-10-29 | Discharge: 2023-10-29 | Disposition: A | Discharge status: Home / Self Care | Source: Ambulatory Visit | Attending: Student | Admitting: Student

## 2023-10-29 ENCOUNTER — Other Ambulatory Visit (HOSPITAL_BASED_OUTPATIENT_CLINIC_OR_DEPARTMENT_OTHER): Payer: Self-pay | Admitting: Student

## 2023-10-29 DIAGNOSIS — M47814 Spondylosis without myelopathy or radiculopathy, thoracic region: Secondary | ICD-10-CM | POA: Diagnosis not present

## 2023-10-29 DIAGNOSIS — S22070A Wedge compression fracture of T9-T10 vertebra, initial encounter for closed fracture: Secondary | ICD-10-CM | POA: Diagnosis not present

## 2023-10-29 DIAGNOSIS — M4856XA Collapsed vertebra, not elsewhere classified, lumbar region, initial encounter for fracture: Secondary | ICD-10-CM | POA: Diagnosis not present

## 2023-10-29 DIAGNOSIS — R29890 Loss of height: Secondary | ICD-10-CM | POA: Diagnosis not present

## 2023-10-29 DIAGNOSIS — M4854XA Collapsed vertebra, not elsewhere classified, thoracic region, initial encounter for fracture: Secondary | ICD-10-CM | POA: Diagnosis not present

## 2023-10-30 ENCOUNTER — Other Ambulatory Visit (HOSPITAL_BASED_OUTPATIENT_CLINIC_OR_DEPARTMENT_OTHER): Payer: Self-pay | Admitting: Student

## 2023-10-30 DIAGNOSIS — S22070A Wedge compression fracture of T9-T10 vertebra, initial encounter for closed fracture: Secondary | ICD-10-CM

## 2023-11-03 ENCOUNTER — Other Ambulatory Visit: Payer: Self-pay | Admitting: Neurosurgery

## 2023-11-04 ENCOUNTER — Ambulatory Visit
Attending: Thoracic Surgery (Cardiothoracic Vascular Surgery) | Admitting: Thoracic Surgery (Cardiothoracic Vascular Surgery)

## 2023-11-04 VITALS — BP 135/55 | HR 80 | Resp 20 | Ht 70.0 in | Wt 166.0 lb

## 2023-11-04 DIAGNOSIS — I7121 Aneurysm of the ascending aorta, without rupture: Secondary | ICD-10-CM

## 2023-11-04 DIAGNOSIS — I351 Nonrheumatic aortic (valve) insufficiency: Secondary | ICD-10-CM | POA: Diagnosis not present

## 2023-11-04 NOTE — Progress Notes (Signed)
 [address] [place] [address]             [phone]     HPI: Mr. Janes returns for assessment of severe AI.  [place] is a 78 year old man with a past medical history significant for aortic root and ascending aortic aneurysm, moderate aortic insufficiency, moderate mitral insufficiency, atrial fibrillation, hypertension, cardiomyopathy, hyperlipidemia, coronary calcification, prostate cancer, pulmonary embolism, stage II chronic kidney disease, lumbar radiculopathy, right foot drop, and chronic pain.  Has been followed in our office for many years.  Initially by Dr. Army and then by Dr. Obadiah.  I saw him for the first time in August of this year.  His CT showed a 5.5 cm aortic root and echocardiogram showed moderate aortic insufficiency ejection fraction of 30 to 35%.  In the interim since that visit he had spine surgery by Dr. Onetha.  Postoperative course was complicated by SVT and then shock.  He required norepinephrine  infusion.  Echocardiogram was stable.  He then had a cardiac MRI which characterized the AI is severe with a regurgitant volume of 59 mL and fraction of 50%.  He denies any chest pain, pressure, tightness, or shortness of breath.  Continues to have severe back pain and has been told he has another fracture that needs to be fixed.  Past Medical History:  Diagnosis Date   Aneurysm of aortic root    Overview:  Last Assessment & Plan:  Planning to see Dr Army for evaluation of 5cm aneurysm.  - will check PFT in prep for possible procedure / SGY   Aortic atherosclerosis 06/14/2022   Aortic regurgitation    Aortic valve insufficiency    Ascending aortic aneurysm 09/11/2016   Atherosclerotic vascular disease 05/27/2022   Atrial fibrillation (HCC)    Atrial fibrillation with RVR (HCC) 07/05/2022   BMI 27.0-27.9,adult 06/14/2022   BPH (benign prostatic hyperplasia)    BRBPR (bright red blood per rectum) 05/20/2023    Cardiomyopathy (HCC) 09/11/2016   CHF (congestive heart failure) (HCC)    CKD (chronic kidney disease) stage 2, GFR 60-89 ml/min 11/23/2021   Closed fracture of right hip (HCC) 03/10/2023   Coronary artery calcification seen on CT scan 10/13/2017   Dysrhythmia    Essential hypertension 06/14/2022   Gastrointestinal hemorrhage 05/20/2023   Heart murmur    High risk medication use 07/31/2022   History of cardiomyopathy 11/09/2021   History of pulmonary embolism 09/24/2012   Overview:  Last Assessment & Plan:  Hx PE x 2 per notes, ? 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  - needs auto-titration study by [employer] or local company, data to [facility] to adjust his home device.    OSA (obstructive sleep apnea) 09/13/2022   Prediabetes    Primary osteoarthritis involving multiple joints 06/14/2022   Prostate cancer metastatic to bone (HCC) 06/14/2022   Pulmonary embolism (HCC)    Rhinitis  Sacral wound 04/11/2023   Seasonal allergies 06/14/2022   Spondylolisthesis of lumbar region 12/12/2021   Stage 3a chronic kidney disease (HCC) 06/14/2022   Unstageable pressure ulcer of sacral region [facility]) 03/10/2023   Past Surgical History:  Procedure Laterality Date   APPENDECTOMY     COLONOSCOPY N/A 05/23/2023   Procedure: COLONOSCOPY;  Surgeon: Elicia Claw, MD;  Location: [facility];  Service: Gastroenterology;  Laterality: N/A;    HERNIA REPAIR     LAMINECTOMY WITH POSTERIOR LATERAL ARTHRODESIS LEVEL 4 N/A 09/03/2023   Procedure: Posterior lateral fusion - Thoracic Ten-Thoracic Eleven - Thoracic Eleven -Thoracic Twelve - Thoracic Twelve-Lumbar One - Lumbar One-Lumbar Two - Lumbar Two-Lumbar Three - Lumbar Three-Lumbar Four, laminectomy and foraminotomy transpedicular decompression Lumbar One;  Surgeon: Onetha Kuba, MD;  Location: [facility] OR;  Service: Neurosurgery;  Laterality: N/A;  Posterior lateral fusion - T10-T11    LUMBAR LAMINECTOMY  02/06/2022   Facetectom & foraminotomy for decompression of the cauda equina & nerve root L4-5, Arley PHEBE Dark, MD   TONSILLECTOMY      Current Outpatient Medications  Medication Sig Dispense Refill   amiodarone  (PACERONE ) 200 MG tablet Take 1 tablet (200 mg total) by mouth daily. 90 tablet 3   bisacodyl  (DULCOLAX) 10 MG suppository Place 1 suppository (10 mg total) rectally daily.     Cyanocobalamin  (B-12) 5000 MCG CAPS Take 1 capsule by mouth daily at 12 noon. 30 capsule 0   dapagliflozin  propanediol (FARXIGA ) 10 MG TABS tablet Take 1 tablet (10 mg total) by mouth daily. 30 tablet 5   doxycycline  (VIBRA -TABS) 100 MG tablet Take 1 tablet (100 mg total) by mouth every 12 (twelve) hours. 6 tablet 0   methocarbamol  (ROBAXIN ) 500 MG tablet Take 1 tablet (500 mg total) by mouth 4 (four) times daily. 120 tablet 0   metoprolol  succinate (TOPROL -XL) 25 MG 24 hr tablet Take 0.5 tablets (12.5 mg total) by mouth 2 (two) times daily. 60 tablet 5   Multiple Vitamins-Minerals (MENS 50+ MULTIVITAMIN) TABS Take 1 tablet by mouth every evening.     pantoprazole  (PROTONIX ) 40 MG tablet Take 1 tablet (40 mg total) by mouth daily. 30 tablet 0   polyethylene glycol (MIRALAX  / GLYCOLAX ) 17 g packet Take 17 g by mouth daily.     potassium chloride  SA (KLOR-CON  M20) 20 MEQ tablet Take 1 tablet (20 mEq total) by mouth daily. 30 tablet 5   Rivaroxaban  (XARELTO ) 15 MG TABS tablet Take 1 tablet (15 mg total) by  mouth daily with supper. 30 tablet 5   rosuvastatin  (CRESTOR ) 10 MG tablet Take 1 tablet (10 mg total) by mouth daily. 30 tablet 5   spironolactone  (ALDACTONE ) 25 MG tablet Take 1 tablet (25 mg total) by mouth daily. 90 tablet 3   tamsulosin  (FLOMAX ) 0.4 MG CAPS capsule Take 1 capsule (0.4 mg total) by mouth daily. 30 capsule 0   torsemide  (DEMADEX ) 10 MG tablet Take 1 tablet (10 mg total) by mouth 2 (two) times daily. 60 tablet 2   traMADol  (ULTRAM ) 50 MG tablet Take 2 tablets (100 mg total) by mouth every 8 (eight) hours as needed for moderate pain (pain score 4-6). 180 tablet 0   No current facility-administered medications for this visit.    Physical Exam BP (!) 135/55   Pulse 80   Resp 20   Ht 5' 10 (1.778 m)   Wt 166 lb (75.3 kg)   SpO2 95% Comment: RA  BMI 23.82 kg/m  Frail 78 year old man.  Ill-appearing.  Obvious discomfort Alert and oriented x 3, right foot drop Prominent carotid pulse, no bruit Cardiac regular, 2/6 diastolic murmur Lungs diminished bilaterally but no rales Trace edema both lower extremities  Diagnostic Tests: CARDIAC MRI   TECHNIQUE: The patient was scanned on a 1.5 Tesla GE magnet. A dedicated cardiac coil was used. Functional imaging was done using Fiesta sequences. 2,3, and 4 chamber views were done to assess for RWMA's. Modified Simpson's rule using a short axis stack was used to calculate an ejection fraction on a dedicated work [employer]. The patient received 10 cc of Gadavist . After 10 minutes inversion recovery sequences were used to assess for infiltration and scar tissue.   FINDINGS: Limited images of the lung fields showed no gross abnormalities.   51 mm ascending aorta.   The left ventricle is severely dilated with normal wall thickness. Mild-moderate diffuse hypokinesis, LV EF 40%. Normal right ventricular size with RV EF 37%. Severely dilated left atrium, mildly dilated right atrium. The aortic valve is not  visualized well enough to comment on number of leaflets. There is severe aortic insufficiency with regurgitant volume 59 mL and regurgitant fraction 50%. No significant mitral regurgitation (regurgitant fraction < 5%).   On delayed enhancement imaging, there is a small area of <50% wall thickness subendocardial late gadolinium enhancement in the basal inferolateral wall. There is a small area of mid-wall LGE in the basal inferoseptal wall at the RV insertion site.   MEASUREMENTS: MEASUREMENTS LVEDV 304 mL   LVEDVi 157 mL/m2   LVSV 122 mL LVEF 40%   RVEDV 93 mL   RVEDVi 48 mL/m2 RVSV 35 mL RVEF 37%   Aortic forward volume 117 mL   Aortic regurgitant volume 59 mL, regurgitant fraction 50%   Global T1 1093, ECV 35%   IMPRESSION: 1.  Severely dilated ascending aorta and root, 51 mm.   2. Severely dilated left ventricle with normal wall thickness, global hypokinesis with LV EF 40%.   3.  Normal RV size with RV EF 37%.   4. There is severe aortic insufficiency with regurgitant fraction 50% and regurgitant volume 59 mL. Unable to tell cusp number on the aortic valve from this study.   5. Delayed enhancement imaging with a small area of subendocardial LGE in the basal inferolateral wall that looks consistent with small prior MI. There is mid-wall LGE at the inferior RV insertion site that is nonspecific and tends to be related to pressure/volume overload.   6. Elevated extracellular volume percentage at 35% suggests increased myocardial fibrotic content.   Severe aortic insufficiency in the setting of dilated ascending aorta and aortic root with LV dilation.   [name]     Electronically Signed   By: Ezra Shuck M.D.   On: 09/11/2023 17:56 I reviewed the MR images.  Deferred to the report above.  Impression: Ashad Fawbush is a 78 year old man with a past medical history significant for aortic root and ascending aortic aneurysm, moderate aortic  insufficiency, moderate mitral insufficiency, atrial fibrillation, hypertension, cardiomyopathy, hyperlipidemia, coronary calcification, prostate cancer, pulmonary embolism, stage II chronic kidney disease, lumbar radiculopathy, right foot drop, and chronic pain.  Aortic root aneurysm with severe aortic insufficiency-symptoms well-controlled currently.  I had a long discussion with Mr. Branscome and his wife.  They understand that over time aortic insufficiency can cause permanent heart failure.  Unclear at present how much of his cardiomyopathy is due to his valvular insufficiency and how much might be due to atrial fibrillation.  They were told previously by one of my partners that he is not a candidate for aortic root replacement.  While I would not completely rule out that possibility I do not think he is a candidate at the present time.  He is unable to sit still due to severe back pain and is essentially wheelchair-bound at this point.  He has been told that he has a new spinal fracture that needs surgical correction.  Obviously, would be high risk for perioperative cardiac complications with spine surgery given his recent history.  Very difficult situation either way.  He very strongly wishes to go ahead with his spine surgery and address the cardiac issues at a later time should we decide to do so.  Plan: Follow-up with Dr. Onetha I will plan to see him back in 3 months when he will be due for a CT of the chest.  I spent over 30 minutes in review of records, images, and consultation with Mr. Esselman today Elspeth JAYSON Millers, MD [facility] [phone]

## 2023-11-06 ENCOUNTER — Ambulatory Visit (HOSPITAL_BASED_OUTPATIENT_CLINIC_OR_DEPARTMENT_OTHER)
Admission: RE | Admit: 2023-11-06 | Discharge: 2023-11-06 | Disposition: A | Source: Ambulatory Visit | Attending: Student | Admitting: Radiology

## 2023-11-06 DIAGNOSIS — S22070A Wedge compression fracture of T9-T10 vertebra, initial encounter for closed fracture: Secondary | ICD-10-CM | POA: Diagnosis not present

## 2023-11-06 MED ORDER — GADOBUTROL 1 MMOL/ML IV SOLN
7.5000 mL | Freq: Once | INTRAVENOUS | Status: AC | PRN
Start: 2023-11-06 — End: 2023-11-06
  Administered 2023-11-06: 7.5 mL via INTRAVENOUS

## 2023-11-10 ENCOUNTER — Ambulatory Visit: Admitting: Internal Medicine

## 2023-11-10 NOTE — Progress Notes (Unsigned)
 Cardiology Office Note:    Date:  11/11/2023   ID:  Aaron Richmond, DOB [birth year], MRN [id number]  PCP:  Aaron Valeria Mayo, MD  Cardiologist:  Aaron Leiter, MD    Referring MD: Aaron Valeria Mayo, MD    ASSESSMENT:    1. Permanent atrial fibrillation (HCC)   2. LV dysfunction   3. Chronic anticoagulation   4. Hypertensive heart disease with heart failure (HCC)   5. Nonrheumatic aortic valve insufficiency   6. Aneurysm of ascending aorta without rupture    PLAN:    In order of problems listed above:  Now maintaining sinus rhythm on amiodarone  Most recent EF seems stable at 37% He remains anticoag With Xarelto  reduced dose Plan is for elective surgery once recovered from his disabling back issues and surgery   Next appointment: They have asked me to see him back in February   Medication Adjustments/Labs and Tests Ordered: Current medicines are reviewed at length with the patient today.  Concerns regarding medicines are outlined above.  Orders Placed This Encounter  Procedures   EKG 12-Lead   No orders of the defined types were placed in this encounter.    History of Present Illness:    Aaron Richmond is a 78 y.o. male with a hx of chronic and atrial fibrillation with rate control and long-term anticoagulation with Eliquis hypertensive heart disease with heart failure aortic insufficiency and aneurysm of the ascending aorta last seen 08/11/2023.  Cardiac CTA July of this year showed a very high coronary calcium  score 1959 fusiform enlargement in anterior ascending aorta up to 53 mm.  CAD was present involving the mid left anterior descending coronary artery 25 to 49% first diagonal branch 50 to 69% and diffuse calcification left circumflex and right coronary arteries.  He has an echocardiogram showing LV dysfunction in June EF 35 to 40% mild to moderate mitral regurgitation and aortic regurgitation.  To optimize heart failure treatment he has restarted on Entresto  that have  been discontinued when he was in a rehab center.  Compliance with diet, lifestyle and medications: Yes  I was not aware of the events in August he had perioperative arrhythmia described as SVT and I am just very surprised because he has had chronic longstanding atrial fibrillation and I wonder in retrospect of what he had was ventricular tachycardia.  Regardless he regained sinus rhythm on amiodarone  and no further arrhythmia. From a cardiac perspective doing well not having edema angina shortness of breath palpitation or syncope Previous it is not felt to be a candidate for cardiac surgery however it is now recommended that he have intervention for his thoracic aortic aneurysm and aortic regurgitation following back surgery scheduled in a few weeks. Past Medical History:  Diagnosis Date   Aneurysm of aortic root    Overview:  Last Assessment & Plan:  Planning to see Dr Army for evaluation of 5cm aneurysm.  - will check PFT in prep for possible procedure / SGY   Aortic atherosclerosis 06/14/2022   Aortic regurgitation    Aortic valve insufficiency    Ascending aortic aneurysm 09/11/2016   Atherosclerotic vascular disease 05/27/2022   Atrial fibrillation (HCC)    Atrial fibrillation with RVR (HCC) 07/05/2022   BMI 27.0-27.9,adult 06/14/2022   BPH (benign prostatic hyperplasia)    BRBPR (bright red blood per rectum) 05/20/2023   Cardiomyopathy (HCC) 09/11/2016   CHF (congestive heart failure) (HCC)    CKD (chronic kidney disease) stage 2, GFR 60-89 ml/min 11/23/2021  Closed fracture of right hip (HCC) 03/10/2023   Coronary artery calcification seen on CT scan 10/13/2017   Dysrhythmia    Essential hypertension 06/14/2022   Gastrointestinal hemorrhage 05/20/2023   Heart murmur    High risk medication use 07/31/2022   History of cardiomyopathy 11/09/2021   History of pulmonary embolism 09/24/2012   Overview:  Last Assessment & Plan:  Hx PE x 2 per notes, ? 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  - needs auto-titration study by [employer] or local company, data to RB to adjust his home device.    OSA (obstructive sleep apnea) 09/13/2022   Prediabetes    Primary osteoarthritis involving multiple joints 06/14/2022   Prostate cancer metastatic to bone (HCC) 06/14/2022   Pulmonary embolism (HCC)    Rhinitis    Sacral wound 04/11/2023   Seasonal allergies 06/14/2022   Spondylolisthesis of lumbar region 12/12/2021   Stage 3a chronic kidney disease (HCC) 06/14/2022   Unstageable pressure ulcer of sacral region (HCC) 03/10/2023    Current Medications: Current Meds  Medication Sig   amiodarone  (PACERONE ) 200 MG tablet Take 1 tablet (200 mg total) by mouth daily.   bisacodyl  (DULCOLAX) 10 MG suppository Place 1 suppository (10 mg total) rectally daily.   Cyanocobalamin  (B-12) 5000 MCG CAPS Take 1 capsule by mouth daily at 12 noon.   dapagliflozin  propanediol (FARXIGA ) 10 MG TABS tablet Take 1 tablet (10 mg total) by mouth daily.   doxycycline  (VIBRA -TABS) 100 MG tablet Take 1 tablet (100 mg total) by mouth every 12 (twelve) hours.   methocarbamol  (ROBAXIN ) 500 MG tablet Take 1 tablet (500 mg total) by mouth 4 (four) times daily.   metoprolol  succinate (TOPROL -XL) 25 MG 24 hr tablet Take 0.5 tablets (12.5 mg total) by mouth 2 (two) times daily.   Multiple Vitamins-Minerals (MENS 50+ MULTIVITAMIN) TABS Take 1  tablet by mouth every evening.   pantoprazole  (PROTONIX ) 40 MG tablet Take 1 tablet (40 mg total) by mouth daily.   polyethylene glycol (MIRALAX  / GLYCOLAX ) 17 g packet Take 17 g by mouth daily.   potassium chloride  SA (KLOR-CON  M20) 20 MEQ tablet Take 1 tablet (20 mEq total) by mouth daily.   Rivaroxaban  (XARELTO ) 15 MG TABS tablet Take 1 tablet (15 mg total) by mouth daily with supper.   rosuvastatin  (CRESTOR ) 10 MG tablet Take 1 tablet (10 mg total) by mouth daily.   spironolactone  (ALDACTONE ) 25 MG tablet Take 1 tablet (25 mg total) by mouth daily.   tamsulosin  (FLOMAX ) 0.4 MG CAPS capsule Take 1 capsule (0.4 mg total) by mouth daily.   torsemide  (DEMADEX ) 10 MG tablet Take 1 tablet (10 mg total) by mouth 2 (two) times daily.   traMADol  (ULTRAM ) 50 MG tablet Take 2 tablets (100 mg total) by mouth every 8 (eight) hours as needed for moderate pain (pain score 4-6).      EKGs/Labs/Other Studies Reviewed:    The following studies were  reviewed today:  Cardiac Studies & Procedures   ______________________________________________________________________________________________   STRESS TESTS  MYOCARDIAL PERFUSION IMAGING 10/27/2017  Interpretation Summary  Nuclear stress EF: 49%.  There was no ST segment deviation noted during stress.  This is a low risk study.  The left ventricular ejection fraction is mildly decreased (45-54%).  Low risk stress nuclear study with normal perfusion mildly dilated left ventricle with mildly reduced global systolic function. Findings suggest nonischemic cardiomyopathy.   ECHOCARDIOGRAM  ECHOCARDIOGRAM COMPLETE 09/05/2023  Narrative ECHOCARDIOGRAM REPORT    Patient Name:   KAYNAN KLONOWSKI Date of Exam: 09/05/2023 Medical Rec #:  [id number]        Height:       71.0 in Accession #:    [id number]       Weight:       172.0 lb Date of Birth:  [birth year]        BSA:          1.977 m Patient Age:    78 years         BP:           97/49  mmHg Patient Gender: M                HR:           94 bpm. Exam Location:  Inpatient  Procedure: 2D Echo, Cardiac Doppler and Color Doppler (Both Spectral and Color Flow Doppler were utilized during procedure).  Indications:    CHF  History:        Patient has prior history of Echocardiogram examinations, most recent 07/04/2023. Cardiomyopathy, CAD, Aortic Valve Disease, Arrythmias:Atrial Fibrillation; Risk Factors:Hypertension.  Sonographer:    Jayson Gaskins Referring Phys: [phone] DALTON S MCLEAN  IMPRESSIONS   1. Left ventricular ejection fraction, by estimation, is 30 to 35%. Left ventricular ejection fraction by 2D MOD biplane is 33.0 %. The left ventricle has moderately decreased function. The left ventricle demonstrates global hypokinesis. Left ventricular diastolic function could not be evaluated. 2. Right ventricular systolic function is mildly reduced. The right ventricular size is normal. 3. Left atrial size was moderately dilated. 4. Right atrial size was mildly dilated. 5. The mitral valve is degenerative. Mild mitral valve regurgitation. 6. The aortic valve was not well visualized. Aortic valve regurgitation is moderate. 7. Aortic dilatation noted. There is severe dilatation of the ascending aorta, measuring 56 mm. 8. Rhythm strip during this exam demonstrates premature ventricular contractions and atrial fibrillation.  Comparison(s): Changes from prior study are noted. 07/04/2023: LVEF 35-40%.  Conclusion(s)/Recommendation(s): Critical findings reported to Dr. Rolan and acknowledged at 09/05/2023 at 1:05 pm.  FINDINGS Left Ventricle: Left ventricular ejection fraction, by estimation, is 30 to 35%. Left ventricular ejection fraction by 2D MOD biplane is 33.0 %. The left ventricle has moderately decreased function. The left ventricle demonstrates global hypokinesis. The left ventricular internal cavity size was normal in size. There is no left ventricular hypertrophy. Left  ventricular diastolic function could not be evaluated due to atrial fibrillation. Left ventricular diastolic function could not be evaluated.  Right Ventricle: The right ventricular size is normal. No increase in right ventricular wall thickness. Right ventricular systolic function is mildly reduced.  Left Atrium: Left atrial size was moderately dilated.  Right Atrium: Right atrial size was mildly dilated.  Pericardium: There is no evidence of pericardial effusion.  Mitral Valve: The mitral valve is degenerative in appearance. There is mild calcification of the anterior and posterior mitral valve leaflet(s). Mild mitral valve  regurgitation.  Tricuspid Valve: The tricuspid valve is grossly normal. Tricuspid valve regurgitation is mild.  Aortic Valve: The aortic valve was not well visualized. Aortic valve regurgitation is moderate. Aortic valve mean gradient measures 7.0 mmHg. Aortic valve peak gradient measures 10.6 mmHg.  Pulmonic Valve: The pulmonic valve was normal in structure. Pulmonic valve regurgitation is not visualized.  Aorta: Aortic dilatation noted. There is severe dilatation of the ascending aorta, measuring 56 mm.  IAS/Shunts: No atrial level shunt detected by color flow Doppler.  EKG: Rhythm strip during this exam demonstrates premature ventricular contractions and atrial fibrillation.   LEFT VENTRICLE PLAX 2D                        Biplane EF (MOD) LVIDd:         5.00 cm         LV Biplane EF:   Left LVIDs:         4.00 cm                          ventricular LV PW:         0.80 cm                          ejection LV IVS:        0.90 cm                          fraction by 2D MOD biplane is LV Volumes (MOD)                                33.0 %. LV vol d, MOD    189.4 ml A2C:                           Diastology LV vol d, MOD    230.1 ml      LV e' lateral: 11.60 cm/s A4C: LV vol s, MOD    122.7 ml A2C: LV vol s, MOD    163.0 ml A4C: LV SV MOD A2C:   66.7  ml LV SV MOD A4C:   230.1 ml LV SV MOD BP:    69.6 ml  RIGHT VENTRICLE RV S prime:     17.30 cm/s  LEFT ATRIUM           Index LA Vol (A2C): 68.5 ml 34.64 ml/m LA Vol (A4C): 91.9 ml 46.47 ml/m AORTIC VALVE AV Vmax:           163.00 cm/s AV Vmean:          128.000 cm/s AV VTI:            0.279 m AV Peak Grad:      10.6 mmHg AV Mean Grad:      7.0 mmHg LVOT Vmax:         132.00 cm/s LVOT Vmean:        106.000 cm/s LVOT VTI:          0.233 m LVOT/AV VTI ratio: 0.84   SHUNTS Systemic VTI: 0.23 m  Vinie Maxcy MD Electronically signed by Vinie Maxcy MD Signature Date/Time: 09/05/2023/1:07:50 PM    Final      CT SCANS  CT CORONARY MORPH W/CTA COR W/SCORE 07/16/2023  Addendum  07/17/2023  3:44 PM ADDENDUM REPORT: 07/17/2023 15:42  CLINICAL DATA:  This is a 78 year old male with anginal symptoms.  EXAM: Cardiac/Coronary  CTA  TECHNIQUE: The patient was scanned on a [employer].  FINDINGS: A 100 kV prospective scan was triggered in the descending thoracic aorta at 111 HU's. Axial non-contrast 3 mm slices were carried out through the heart. The data set was analyzed on a dedicated work station and scored using the Agatson method. Gantry rotation speed was 250 msecs and collimation was .6 mm. No beta blockade and 0.8 mg of sl NTG was given. The 3D data set was reconstructed in 5% intervals of the 67-82 % of the R-R cycle. Diastolic phases were analyzed on a dedicated work station using MPR, MIP and VRT modes. The patient received 80 cc of contrast.  Aorta: The proximal ascending aorta is anuerysm (52 mm) in size. No calcifications. No dissection.  Aortic Valve:  Trileaflet.  No calcifications.  Coronary Arteries:  Normal coronary origin. Right Dominance.  RCA is a large dominant artery that gives rise to PDA and PLA. The RCA is a torturous artery. In the Proximal RCA there is a mild focal calcification. Mid RCA in a bend there appears to be a  mild soft plaque with minimal diffuse calcifications. Distal RCA with two separate minimal mixed plaques.  Left main is a large artery that gives rise to LAD and LCX arteries. Minimal calcification in the mid left main vessel.  LAD is a large vessel. There is a focal mild calcified plaque in the ostial LAD. There is diffuse minimal (<24%) calcified plaque in the proximal LAD. The mid LAD with mild (25-49%) diffuse calcifications right below is a mid soft plaque. The distal LAD with minimal focal plaques.  First diagonal branch is a medium sized vessel with moderate (50-69%) soft plaque in the proximal portion of the vessel.  LCX is a non-dominant artery that gives rise to one large OM1 branch. Diffuse minimal calcifications throughout the LCX artery.  First obtuse marginal with mild calcified plaque in the proximal portion of the vessel.  Coronary Calcium  Score:  Left main: 29.3  Left anterior descending artery: 918  Left circumflex artery: 391  Right coronary artery: 621  Total: 1959  Percentile: 88  Other findings:  Normal pulmonary vein drainage into the left atrium.  Normal left atrial appendage without a thrombus.  Normal size of the pulmonary artery.  IMPRESSION: 1. Coronary calcium  score of 1959. This was 59 percentile for age and sex matched control.  2. Normal coronary origin with right dominance.  3. CAD-RADS 3. Moderate stenosis. Consider symptom-guided anti-ischemic pharmacotherapy as well as risk factor modification per guideline directed care. Additional analysis with CT FFR .  4. Total plaque volume 2015 mm3 which is 94th percentile for age- and, sex-matched controls (calcified plaque 441 mm3; non-calcified plaque 1574 mm3, Low attenuation 8 mm3). TPV is extensive.  The noncardiac portion of this study will be interpreted in separate report by the radiologist.   Electronically Signed By: Kardie  Tobb D.O. On: 07/17/2023 15:42  Addendum  07/17/2023  2:08 PM ADDENDUM REPORT: 07/17/2023 14:05  EXAM: OVER-READ INTERPRETATION  CT CHEST  The following report is an over-read performed by radiologist Dr. Ester Imam [facility] on 07/17/2023. This over-read does not include interpretation of cardiac or coronary anatomy or pathology. The coronary CTA interpretation by the cardiologist is attached.  COMPARISON:  02/20/2023  FINDINGS: Cardiovascular: Similar appearing biatrial cardiomegaly. Scattered  three-vessel coronary atherosclerotic calcifications. Similar appearing fusiform ascending thoracic aortic aneurysm measuring up to 53 mm. Scattered atherosclerotic calcifications about the visualized thoracic aorta.  Mediastinum/Nodes: Visualized esophagus and trachea within normal limits. No mediastinal lymphadenopathy.  Lungs/Pleura: The lungs are clear bilaterally.  Upper Abdomen: The visualized upper abdomen is within normal limits.  Musculoskeletal: No acute osseous abnormality.  IMPRESSION: 1. Similar appearing fusiform ascending thoracic aortic aneurysm measuring up to 5.3 cm. Ascending thoracic aortic aneurysm. Recommend semi-annual imaging followup by CTA or MRA and referral to cardiothoracic surgery if not already obtained. This recommendation follows 2010 ACCF/AHA/AATS/ACR/ASA/SCA/SCAI/SIR/STS/SVM Guidelines for the Diagnosis and Management of Patients With Thoracic Aortic Disease. Circulation. 2010; 121: Z733-z630. Aortic aneurysm NOS (ICD10-I71.9) 2. Coronary and aortic atherosclerosis (ICD10-I70.0).  Ester Sides, MD  [facility]  [facility]   Electronically Signed By: Ester Sides M.D. On: 07/17/2023 14:05  Narrative CLINICAL DATA:  This is a 78 year old male with anginal symptoms.  EXAM: Cardiac/Coronary  CTA  TECHNIQUE: The patient was scanned on a [employer].  FINDINGS: A 100 kV prospective scan was  triggered in the descending thoracic aorta at 111 HU's. Axial non-contrast 3 mm slices were carried out through the heart. The data set was analyzed on a dedicated work station and scored using the Agatson method. Gantry rotation speed was 250 msecs and collimation was .6 mm. No beta blockade and 0.8 mg of sl NTG was given. The 3D data set was reconstructed in 5% intervals of the 67-82 % of the R-R cycle. Diastolic phases were analyzed on a dedicated work station using MPR, MIP and VRT modes. The patient received 80 cc of contrast.  Aorta:   Size.  No calcifications.  No dissection.  Aortic Valve:  Trileaflet.  No calcifications.  Coronary Arteries:  Normal coronary origin. Right Dominance.  RCA is a large dominant artery that gives rise to PDA and PLA. The RCA is a torturous artery. In the Proximal RCA there is a mild focal calcification. Mid RCA in a bend there appears to be a mild soft plaque with minimal diffuse calcifications. Distal RCA with two separate minimal mixed plaques.  Left main is a large artery that gives rise to LAD and LCX arteries. Minimal calcification in the mid left main vessel.  LAD is a large vessel that has no plaque. There is a focal mild calcified plaque in the ostial LAD. There is diffuse minimal (<24%) calcified plaque in the proximal LAD. The mid LAD with mild (25-49%) diffuse calcifications right below is a mid soft plaque. The distal LAD with minimal focal plaques.  First diagonal branch is a medium sized vessel with moderate (50-69%) soft plaque in the proximal portion of the vessel.  LCX is a non-dominant artery that gives rise to one large OM1 branch. Diffuse minimal calcifications throughout the LCX artery.  First obtuse marginal with mild calcified plaque in the proximal portion of the vessel.  Coronary Calcium  Score:  Left main: 29.3  Left anterior descending artery: 918  Left circumflex artery: 391  Right coronary artery:  621  Total: 1959  Percentile: 88  Other findings:  Normal pulmonary vein drainage into the left atrium.  Normal left atrial appendage without a thrombus.  Normal size of the pulmonary artery.  IMPRESSION: 1. Coronary calcium  score of 1959. This was 48 percentile for age and sex matched control.  2. Normal coronary origin with right dominance.  3. CAD-RADS 3. Moderate stenosis. Consider symptom-guided anti-ischemic pharmacotherapy  as well as risk factor modification per guideline directed care. Additional analysis with CT FFR .  4. Total plaque volume 2015 mm3 which is 94th percentile for age- and, sex-matched controls (calcified plaque 441 mm3; non-calcified plaque 1574 mm3, Low attenuation 8 mm3). TPV is extensive.  The noncardiac portion of this study will be interpreted in separate report by the radiologist.  Electronically Signed: By: Kardie  Tobb D.O. On: 07/17/2023 13:58   CARDIAC MRI  MR CARDIAC MORPHOLOGY W WO CONTRAST 09/11/2023  Narrative CLINICAL DATA:  Cardiomyopathy of uncertain etiology  EXAM: CARDIAC MRI  TECHNIQUE: The patient was scanned on a 1.5 Tesla GE magnet. A dedicated cardiac coil was used. Functional imaging was done using Fiest[employer] sequences. 2,3, and 4 chamber views were done to assess for RWMA's. Modified Simpson's rule using a short axis stack was used to calculate an ejection fraction on a dedicated work [employer]. The patient received 10 cc of Gadavist . After 10 minutes inversion recovery sequences were used to assess for infiltration and scar tissue.  FINDINGS: Limited images of the lung fields showed no gross abnormalities.  51 mm ascending aorta.  The left ventricle is severely dilated with normal wall thickness. Mild-moderate diffuse hypokinesis, LV EF 40%. Normal right ventricular size with RV EF 37%. Severely dilated left atrium, mildly dilated right atrium. The aortic valve is not visualized  well enough to comment on number of leaflets. There is severe aortic insufficiency with regurgitant volume 59 mL and regurgitant fraction 50%. No significant mitral regurgitation (regurgitant fraction < 5%).  On delayed enhancement imaging, there is a small area of <50% wall thickness subendocardial late gadolinium enhancement in the basal inferolateral wall. There is a small area of mid-wall LGE in the basal inferoseptal wall at the RV insertion site.  MEASUREMENTS: MEASUREMENTS LVEDV 304 mL  LVEDVi 157 mL/m2  LVSV 122 mL LVEF 40%  RVEDV 93 mL  RVEDVi 48 mL/m2 RVSV 35 mL RVEF 37%  Aortic forward volume 117 mL  Aortic regurgitant volume 59 mL, regurgitant fraction 50%  Global T1 1093, ECV 35%  IMPRESSION: 1.  Severely dilated ascending aorta and root, 51 mm.  2. Severely dilated left ventricle with normal wall thickness, global hypokinesis with LV EF 40%.  3.  Normal RV size with RV EF 37%.  4. There is severe aortic insufficiency with regurgitant fraction 50% and regurgitant volume 59 mL. Unable to tell cusp number on the aortic valve from this study.  5. Delayed enhancement imaging with a small area of subendocardial LGE in the basal inferolateral wall that looks consistent with small prior MI. There is mid-wall LGE at the inferior RV insertion site that is nonspecific and tends to be related to pressure/volume overload.  6. Elevated extracellular volume percentage at 35% suggests increased myocardial fibrotic content.  Severe aortic insufficiency in the setting of dilated ascending aorta and aortic root with LV dilation.  [name]   Electronically Signed By: Ezra Shuck M.D. On: 09/11/2023 17:56   ______________________________________________________________________________________________      EKG Interpretation Date/Time:  Tuesday November 11 2023 13:16:07 EST Ventricular Rate:  62 PR Interval:  120 QRS Duration:  90 QT  Interval:  450 QTC Calculation: 456 R Axis:   -8  Text Interpretation: Normal sinus rhythm with sinus arrhythmia Moderate voltage criteria for LVH, may be normal variant ( R in aVL , Cornell product ) Nonspecific ST abnormality When compared with ECG of 02-Oct-2023 14:11, No significant change was found Confirmed by  Monetta Rogue ([id number]) on 11/11/2023 1:22:14 PM   Recent Labs: 09/10/2023: Magnesium  2.1 09/11/2023: ALT 14 10/02/2023: BUN 32; Creatinine, Ser 1.34; Hemoglobin 10.7; Platelets 296; Potassium 4.5; Sodium 136  Recent Lipid Panel    Component Value Date/Time   CHOL 126 08/04/2023 1010   TRIG 120 08/04/2023 1010   HDL 39 (L) 08/04/2023 1010   CHOLHDL 3.2 08/04/2023 1010   LDLCALC 65 08/04/2023 1010    Physical Exam:    VS:  BP (!) 122/50   Pulse 62   Ht 5' 10 (1.778 m)   Wt 166 lb (75.3 kg)   SpO2 97%   BMI 23.82 kg/m     Wt Readings from Last 3 Encounters:  11/11/23 166 lb (75.3 kg)  11/04/23 166 lb (75.3 kg)  10/15/23 166 lb (75.3 kg)     GEN: He looks improved well nourished, well developed in no acute distress HEENT: Normal NECK: No JVD; No carotid bruits LYMPHATICS: No lymphadenopathy CARDIAC: 2/6 murmur aortic regurgitation throughout the precordium he does not have bounding pulse or pistol shot RRR, no murmurs, rubs, gallops RESPIRATORY:  Clear to auscultation without rales, wheezing or rhonchi  ABDOMEN: Soft, non-tender, non-distended MUSCULOSKELETAL:  No edema; No deformity  SKIN: Warm and dry NEUROLOGIC:  Alert and oriented x 3 PSYCHIATRIC:  Normal affect    Signed, Rogue Monetta, MD  11/11/2023 1:22 PM    [facility]

## 2023-11-11 ENCOUNTER — Ambulatory Visit: Attending: Cardiology | Admitting: Cardiology

## 2023-11-11 ENCOUNTER — Encounter: Payer: Self-pay | Admitting: Cardiology

## 2023-11-11 ENCOUNTER — Encounter: Payer: Self-pay | Admitting: *Deleted

## 2023-11-11 VITALS — BP 122/50 | HR 62 | Ht 70.0 in | Wt 166.0 lb

## 2023-11-11 DIAGNOSIS — I351 Nonrheumatic aortic (valve) insufficiency: Secondary | ICD-10-CM

## 2023-11-11 DIAGNOSIS — S22079A Unspecified fracture of T9-T10 vertebra, initial encounter for closed fracture: Secondary | ICD-10-CM | POA: Insufficient documentation

## 2023-11-11 DIAGNOSIS — I7121 Aneurysm of the ascending aorta, without rupture: Secondary | ICD-10-CM

## 2023-11-11 DIAGNOSIS — T8189XA Other complications of procedures, not elsewhere classified, initial encounter: Secondary | ICD-10-CM | POA: Insufficient documentation

## 2023-11-11 DIAGNOSIS — I4821 Permanent atrial fibrillation: Secondary | ICD-10-CM

## 2023-11-11 DIAGNOSIS — I110 Hypertensive heart disease with heart failure: Secondary | ICD-10-CM | POA: Diagnosis not present

## 2023-11-11 DIAGNOSIS — Z7901 Long term (current) use of anticoagulants: Secondary | ICD-10-CM | POA: Diagnosis not present

## 2023-11-11 DIAGNOSIS — G5731 Lesion of lateral popliteal nerve, right lower limb: Secondary | ICD-10-CM | POA: Insufficient documentation

## 2023-11-11 DIAGNOSIS — I519 Heart disease, unspecified: Secondary | ICD-10-CM

## 2023-11-11 NOTE — Patient Instructions (Signed)
 Medication Instructions:  Your physician recommends that you continue on your current medications as directed. Please refer to the Current Medication list given to you today.  *If you need a refill on your cardiac medications before your next appointment, please call your pharmacy*  Lab Work: None If you have labs (blood work) drawn today and your tests are completely normal, you will receive your results only by: MyChart Message (if you have MyChart) OR A paper copy in the mail If you have any lab test that is abnormal or we need to change your treatment, we will call you to review the results.  Testing/Procedures: None  Follow-Up: At [facility], you and your health needs are our priority.  As part of our continuing mission to provide you with exceptional heart care, our providers are all part of one team.  This team includes your primary Cardiologist (physician) and Advanced Practice Providers or APPs (Physician Assistants and Nurse Practitioners) who all work together to provide you with the care you need, when you need it.  Your next appointment:   3 month(s)  Provider:   Norman Herrlich, MD    We recommend signing up for the patient portal called "MyChart".  Sign up information is provided on this After Visit Summary.  MyChart is used to connect with patients for Virtual Visits (Telemedicine).  Patients are able to view lab/test results, encounter notes, upcoming appointments, etc.  Non-urgent messages can be sent to your provider as well.   To learn more about what you can do with MyChart, go to [email or web address].   Other Instructions None

## 2023-11-12 ENCOUNTER — Other Ambulatory Visit: Payer: Self-pay | Admitting: Internal Medicine

## 2023-11-13 ENCOUNTER — Other Ambulatory Visit (HOSPITAL_BASED_OUTPATIENT_CLINIC_OR_DEPARTMENT_OTHER): Payer: Self-pay

## 2023-11-13 ENCOUNTER — Other Ambulatory Visit: Payer: Self-pay | Admitting: Internal Medicine

## 2023-11-13 ENCOUNTER — Other Ambulatory Visit (HOSPITAL_COMMUNITY): Payer: Self-pay

## 2023-11-14 ENCOUNTER — Encounter (HOSPITAL_BASED_OUTPATIENT_CLINIC_OR_DEPARTMENT_OTHER): Payer: Self-pay | Admitting: Pharmacy Technician

## 2023-11-14 ENCOUNTER — Other Ambulatory Visit (HOSPITAL_BASED_OUTPATIENT_CLINIC_OR_DEPARTMENT_OTHER): Payer: Self-pay

## 2023-11-16 ENCOUNTER — Other Ambulatory Visit (HOSPITAL_COMMUNITY): Payer: Self-pay

## 2023-11-17 NOTE — Progress Notes (Signed)
 Surgical Instructions   Your procedure is scheduled on Monday, November 17th. Report to [facility] Main Entrance A at 8:20 A.M., then check in with the Admitting office. Any questions or running late day of surgery: call [phone]  Questions prior to your surgery date: call [phone], Monday-Friday, 8am-4pm. If you experience any cold or flu symptoms such as cough, fever, chills, shortness of breath, etc. between now and your scheduled surgery, please notify us  at the above number.     Remember:  Do not eat or drink after midnight the night before your surgery. This includes no gum, mints, or hard candy.   Take these medicines the morning of surgery with A SIP OF WATER  amiodarone  (PACERONE )  metoprolol  succinate (TOPROL -XL)  pantoprazole  (PROTONIX )  rosuvastatin  (CRESTOR )  tamsulosin  (FLOMAX )   May take these medicines IF NEEDED: methocarbamol  (ROBAXIN )  traMADol  (ULTRAM )   Per your physician's instruction, HOLD your dapagliflozin  propanediol (FARXIGA ) for 3 day's prior to surgery.  Last dose on 11/20/23.    Follow your surgeon's instructions on when to stop Rivaroxaban  (XARELTO ).  If no instructions were given by your surgeon then you will need to call the office to get those instructions.    One week prior to surgery, STOP taking any Aspirin (unless otherwise instructed by your surgeon) Aleve, Naproxen, Ibuprofen, Motrin, Advil, Goody's, BC's, all herbal medications, fish oil, and non-prescription vitamins.                     Do NOT Smoke (Tobacco/Vaping) for 24 hours prior to your procedure.  If you use a CPAP at night, you may bring your mask/headgear for your overnight stay.   You will be asked to remove any contacts, glasses, piercing's, hearing aid's, dentures/partials prior to surgery. Please bring cases for these items if needed.    Patients discharged the day of surgery will not be allowed to drive home, and someone needs to stay with them for 24  hours.  SURGICAL WAITING ROOM VISITATION Patients may have no more than 2 support people in the waiting area - these visitors may rotate.   Pre-op nurse will coordinate an appropriate time for 1 ADULT support person, who may not rotate, to accompany patient in pre-op.  Children under the age of 22 must have an adult with them who is not the patient and must remain in the main waiting area with an adult.  If the patient needs to stay at the hospital during part of their recovery, the visitor guidelines for inpatient rooms apply.  Please refer to the [facility] website for the visitor guidelines for any additional information.   If you received a COVID test during your pre-op visit  it is requested that you wear a mask when out in public, stay away from anyone that may not be feeling well and notify your surgeon if you develop symptoms. If you have been in contact with anyone that has tested positive in the last 10 days please notify you surgeon.      Pre-operative 4 CHG Bathing Instructions   You can play a key role in reducing the risk of infection after surgery. Your skin needs to be as free of germs as possible. You can reduce the number of germs on your skin by washing with CHG (chlorhexidine  gluconate) soap before surgery. CHG is an antiseptic soap that kills germs and continues to kill germs even after washing.   DO NOT use if you have an allergy to chlorhexidine /CHG  or antibacterial soaps. If your skin becomes reddened or irritated, stop using the CHG and notify one of our RNs at [phone].   Please shower with the CHG soap starting 4 days before surgery using the following schedule:     Please keep in mind the following:  DO NOT shave, including legs and underarms, starting the day of your first shower.   You may shave your face at any point before/day of surgery.  Place clean sheets on your bed the day you start using CHG soap. Use a clean washcloth (not used since being  washed) for each shower. DO NOT sleep with pets once you start using the CHG.   CHG Shower Instructions:  Wash your face and private area with normal soap. If you choose to wash your hair, wash first with your normal shampoo.  After you use shampoo/soap, rinse your hair and body thoroughly to remove shampoo/soap residue.  Turn the water OFF and apply  bottle of CHG soap to a CLEAN washcloth.  Apply CHG soap ONLY FROM YOUR NECK DOWN TO YOUR TOES (washing for 3-5 minutes)  DO NOT use CHG soap on face, private areas, open wounds, or sores.  Pay special attention to the area where your surgery is being performed.  If you are having back surgery, having someone wash your back for you may be helpful. Wait 2 minutes after CHG soap is applied, then you may rinse off the CHG soap.  Pat dry with a clean towel  Put on clean clothes/pajamas   If you choose to wear lotion, please use ONLY the CHG-compatible lotions that are listed below.  Additional instructions for the day of surgery:  If you choose, you may shower the morning of surgery with an antibacterial soap.  DO NOT APPLY any lotions, deodorants, cologne, or perfumes.   Do not bring valuables to the hospital. [facility] is not responsible for any belongings/valuables. Do not wear nail polish, gel polish, artificial nails, or any other type of covering on natural nails (fingers and toes) Do not wear jewelry or makeup Put on clean/comfortable clothes.  Please brush your teeth.  Ask your nurse before applying any prescription medications to the skin.     CHG Compatible Lotions   Aveeno Moisturizing lotion  Cetaphil Moisturizing Cream  Cetaphil Moisturizing Lotion  Clairol Herbal Essence Moisturizing Lotion, Dry Skin  Clairol Herbal Essence Moisturizing Lotion, Extra Dry Skin  Clairol Herbal Essence Moisturizing Lotion, Normal Skin  Curel Age Defying Therapeutic Moisturizing Lotion with Alpha Hydroxy  Curel Extreme Care Body Lotion   Curel Soothing Hands Moisturizing Hand Lotion  Curel Therapeutic Moisturizing Cream, Fragrance-Free  Curel Therapeutic Moisturizing Lotion, Fragrance-Free  Curel Therapeutic Moisturizing Lotion, Original Formula  Eucerin Daily Replenishing Lotion  Eucerin Dry Skin Therapy Plus Alpha Hydroxy Crme  Eucerin Dry Skin Therapy Plus Alpha Hydroxy Lotion  Eucerin Original Crme  Eucerin Original Lotion  Eucerin Plus Crme Eucerin Plus Lotion  Eucerin TriLipid Replenishing Lotion  Keri Anti-Bacterial Hand Lotion  Keri Deep Conditioning Original Lotion Dry Skin Formula Softly Scented  Keri Deep Conditioning Original Lotion, Fragrance Free Sensitive Skin Formula  Keri Lotion Fast Absorbing Fragrance Free Sensitive Skin Formula  Keri Lotion Fast Absorbing Softly Scented Dry Skin Formula  Keri Original Lotion  Keri Skin Renewal Lotion Keri Silky Smooth Lotion  Keri Silky Smooth Sensitive Skin Lotion  Nivea Body Creamy Conditioning Oil  Nivea Body Extra Enriched Arts Development Officer  Crme  Nivea Skin Firming Lotion  NutraDerm 30 Skin Lotion  NutraDerm Skin Lotion  NutraDerm Therapeutic Skin Cream  NutraDerm Therapeutic Skin Lotion  ProShield Protective Hand Cream  Provon moisturizing lotion  Please read over the following fact sheets that you were given.

## 2023-11-18 ENCOUNTER — Inpatient Hospital Stay (HOSPITAL_COMMUNITY)
Admission: RE | Admit: 2023-11-18 | Discharge: 2023-11-18 | Disposition: A | Source: Ambulatory Visit | Attending: Neurosurgery

## 2023-11-18 ENCOUNTER — Other Ambulatory Visit: Payer: Self-pay

## 2023-11-18 ENCOUNTER — Encounter (HOSPITAL_COMMUNITY): Payer: Self-pay

## 2023-11-18 VITALS — BP 107/42 | HR 63 | Temp 98.0°F | Resp 18 | Ht 70.0 in | Wt 166.0 lb

## 2023-11-18 DIAGNOSIS — M4854XA Collapsed vertebra, not elsewhere classified, thoracic region, initial encounter for fracture: Secondary | ICD-10-CM | POA: Diagnosis not present

## 2023-11-18 DIAGNOSIS — I130 Hypertensive heart and chronic kidney disease with heart failure and stage 1 through stage 4 chronic kidney disease, or unspecified chronic kidney disease: Secondary | ICD-10-CM | POA: Insufficient documentation

## 2023-11-18 DIAGNOSIS — I700 Atherosclerosis of aorta: Secondary | ICD-10-CM | POA: Insufficient documentation

## 2023-11-18 DIAGNOSIS — Z7984 Long term (current) use of oral hypoglycemic drugs: Secondary | ICD-10-CM | POA: Insufficient documentation

## 2023-11-18 DIAGNOSIS — Z01812 Encounter for preprocedural laboratory examination: Secondary | ICD-10-CM | POA: Diagnosis not present

## 2023-11-18 DIAGNOSIS — I509 Heart failure, unspecified: Secondary | ICD-10-CM | POA: Diagnosis not present

## 2023-11-18 DIAGNOSIS — Z79899 Other long term (current) drug therapy: Secondary | ICD-10-CM | POA: Diagnosis not present

## 2023-11-18 DIAGNOSIS — I080 Rheumatic disorders of both mitral and aortic valves: Secondary | ICD-10-CM | POA: Diagnosis not present

## 2023-11-18 DIAGNOSIS — Z01818 Encounter for other preprocedural examination: Secondary | ICD-10-CM

## 2023-11-18 DIAGNOSIS — G4733 Obstructive sleep apnea (adult) (pediatric): Secondary | ICD-10-CM | POA: Insufficient documentation

## 2023-11-18 DIAGNOSIS — I493 Ventricular premature depolarization: Secondary | ICD-10-CM | POA: Diagnosis not present

## 2023-11-18 DIAGNOSIS — I428 Other cardiomyopathies: Secondary | ICD-10-CM | POA: Diagnosis not present

## 2023-11-18 DIAGNOSIS — I7121 Aneurysm of the ascending aorta, without rupture: Secondary | ICD-10-CM | POA: Diagnosis not present

## 2023-11-18 DIAGNOSIS — Z86711 Personal history of pulmonary embolism: Secondary | ICD-10-CM | POA: Diagnosis not present

## 2023-11-18 DIAGNOSIS — N182 Chronic kidney disease, stage 2 (mild): Secondary | ICD-10-CM | POA: Insufficient documentation

## 2023-11-18 DIAGNOSIS — R7989 Other specified abnormal findings of blood chemistry: Secondary | ICD-10-CM

## 2023-11-18 DIAGNOSIS — I2510 Atherosclerotic heart disease of native coronary artery without angina pectoris: Secondary | ICD-10-CM | POA: Insufficient documentation

## 2023-11-18 LAB — TYPE AND SCREEN
ABO/RH(D): O POS
Antibody Screen: NEGATIVE

## 2023-11-18 LAB — BASIC METABOLIC PANEL WITH GFR
Anion gap: 11 (ref 5–15)
BUN: 43 mg/dL — ABNORMAL HIGH (ref 8–23)
CO2: 25 mmol/L (ref 22–32)
Calcium: 9.2 mg/dL (ref 8.9–10.3)
Chloride: 100 mmol/L (ref 98–111)
Creatinine, Ser: 1.9 mg/dL — ABNORMAL HIGH (ref 0.61–1.24)
GFR, Estimated: 36 mL/min — ABNORMAL LOW (ref >60)
Glucose, Bld: 147 mg/dL — ABNORMAL HIGH (ref 70–99)
Potassium: 4.6 mmol/L (ref 3.5–5.1)
Sodium: 136 mmol/L (ref 135–145)

## 2023-11-18 LAB — CBC
HCT: 41.4 % (ref 39.0–52.0)
Hemoglobin: 12.9 g/dL — ABNORMAL LOW (ref 13.0–17.0)
MCH: 28.9 pg (ref 26.0–34.0)
MCHC: 31.2 g/dL (ref 30.0–36.0)
MCV: 92.6 fL (ref 80.0–100.0)
Platelets: 311 K/uL (ref 150–400)
RBC: 4.47 MIL/uL (ref 4.22–5.81)
RDW: 15 % (ref 11.5–15.5)
WBC: 8.6 K/uL (ref 4.0–10.5)
nRBC: 0 % (ref 0.0–0.2)

## 2023-11-18 LAB — SURGICAL PCR SCREEN
MRSA, PCR: POSITIVE — AB
Staphylococcus aureus: POSITIVE — AB

## 2023-11-18 NOTE — Progress Notes (Signed)
 Surgical Instructions   Your procedure is scheduled on Monday, November 17th. Report to [facility] Main Entrance A at 8:20 A.M., then check in with the Admitting office. Any questions or running late day of surgery: call [phone]  Questions prior to your surgery date: call [phone], Monday-Friday, 8am-4pm. If you experience any cold or flu symptoms such as cough, fever, chills, shortness of breath, etc. between now and your scheduled surgery, please notify us  at the above number.     Remember:  Do not eat or drink after midnight the night before your surgery. This includes no gum, mints, or hard candy.    Take these medicines the morning of surgery with A SIP OF WATER: acetaminophen  (TYLENOL )  amiodarone  (PACERONE )  dapagliflozin  propanediol (FARXIGA )  doxycycline  (VIBRA -TABS)  metoprolol  succinate (TOPROL -XL)  tamsulosin  (FLOMAX )   STOP taking your Rivaroxaban  (XARELTO ) three days prior to surgery. Your last dose will be November 13th.      One week prior to surgery, STOP taking any Aspirin (unless otherwise instructed by your surgeon) Aleve, Naproxen, Ibuprofen, Motrin, Advil, Goody's, BC's, all herbal medications, fish oil, and non-prescription vitamins.                     Do NOT Smoke (Tobacco/Vaping) for 24 hours prior to your procedure.  If you use a CPAP at night, you may bring your mask/headgear for your overnight stay.   You will be asked to remove any contacts, glasses, piercing's, hearing aid's, dentures/partials prior to surgery. Please bring cases for these items if needed.    Patients discharged the day of surgery will not be allowed to drive home, and someone needs to stay with them for 24 hours.  SURGICAL WAITING ROOM VISITATION Patients may have no more than 2 support people in the waiting area - these visitors may rotate.   Pre-op nurse will coordinate an appropriate time for 1 ADULT support person, who may not rotate, to accompany patient in  pre-op.  Children under the age of 44 must have an adult with them who is not the patient and must remain in the main waiting area with an adult.  If the patient needs to stay at the hospital during part of their recovery, the visitor guidelines for inpatient rooms apply.  Please refer to the [facility] website for the visitor guidelines for any additional information.   If you received a COVID test during your pre-op visit  it is requested that you wear a mask when out in public, stay away from anyone that may not be feeling well and notify your surgeon if you develop symptoms. If you have been in contact with anyone that has tested positive in the last 10 days please notify you surgeon.      Pre-operative 4 CHG Bathing Instructions   You can play a key role in reducing the risk of infection after surgery. Your skin needs to be as free of germs as possible. You can reduce the number of germs on your skin by washing with CHG (chlorhexidine  gluconate) soap before surgery. CHG is an antiseptic soap that kills germs and continues to kill germs even after washing.   DO NOT use if you have an allergy to chlorhexidine /CHG or antibacterial soaps. If your skin becomes reddened or irritated, stop using the CHG and notify one of our RNs at [phone].   Please shower with the CHG soap starting 4 days before surgery using the following schedule:     Please  keep in mind the following:  DO NOT shave, including legs and underarms, starting the day of your first shower.   You may shave your face at any point before/day of surgery.  Place clean sheets on your bed the day you start using CHG soap. Use a clean washcloth (not used since being washed) for each shower. DO NOT sleep with pets once you start using the CHG.   CHG Shower Instructions:  Wash your face and private area with normal soap. If you choose to wash your hair, wash first with your normal shampoo.  After you use shampoo/soap, rinse  your hair and body thoroughly to remove shampoo/soap residue.  Turn the water OFF and apply  bottle of CHG soap to a CLEAN washcloth.  Apply CHG soap ONLY FROM YOUR NECK DOWN TO YOUR TOES (washing for 3-5 minutes)  DO NOT use CHG soap on face, private areas, open wounds, or sores.  Pay special attention to the area where your surgery is being performed.  If you are having back surgery, having someone wash your back for you may be helpful. Wait 2 minutes after CHG soap is applied, then you may rinse off the CHG soap.  Pat dry with a clean towel  Put on clean clothes/pajamas   If you choose to wear lotion, please use ONLY the CHG-compatible lotions that are listed below.  Additional instructions for the day of surgery:  If you choose, you may shower the morning of surgery with an antibacterial soap.  DO NOT APPLY any lotions, deodorants, cologne, or perfumes.   Do not bring valuables to the hospital. [facility] is not responsible for any belongings/valuables. Do not wear nail polish, gel polish, artificial nails, or any other type of covering on natural nails (fingers and toes) Do not wear jewelry or makeup Put on clean/comfortable clothes.  Please brush your teeth.  Ask your nurse before applying any prescription medications to the skin.     CHG Compatible Lotions   Aveeno Moisturizing lotion  Cetaphil Moisturizing Cream  Cetaphil Moisturizing Lotion  Clairol Herbal Essence Moisturizing Lotion, Dry Skin  Clairol Herbal Essence Moisturizing Lotion, Extra Dry Skin  Clairol Herbal Essence Moisturizing Lotion, Normal Skin  Curel Age Defying Therapeutic Moisturizing Lotion with Alpha Hydroxy  Curel Extreme Care Body Lotion  Curel Soothing Hands Moisturizing Hand Lotion  Curel Therapeutic Moisturizing Cream, Fragrance-Free  Curel Therapeutic Moisturizing Lotion, Fragrance-Free  Curel Therapeutic Moisturizing Lotion, Original Formula  Eucerin Daily Replenishing Lotion  Eucerin Dry  Skin Therapy Plus Alpha Hydroxy Crme  Eucerin Dry Skin Therapy Plus Alpha Hydroxy Lotion  Eucerin Original Crme  Eucerin Original Lotion  Eucerin Plus Crme Eucerin Plus Lotion  Eucerin TriLipid Replenishing Lotion  Keri Anti-Bacterial Hand Lotion  Keri Deep Conditioning Original Lotion Dry Skin Formula Softly Scented  Keri Deep Conditioning Original Lotion, Fragrance Free Sensitive Skin Formula  Keri Lotion Fast Absorbing Fragrance Free Sensitive Skin Formula  Keri Lotion Fast Absorbing Softly Scented Dry Skin Formula  Keri Original Lotion  Keri Skin Renewal Lotion Keri Silky Smooth Lotion  Keri Silky Smooth Sensitive Skin Lotion  Nivea Body Creamy Conditioning Oil  Nivea Body Extra Enriched Lotion  Nivea Body Original Lotion  Nivea Body Sheer Moisturizing Lotion Nivea Crme  Nivea Skin Firming Lotion  NutraDerm 30 Skin Lotion  NutraDerm Skin Lotion  NutraDerm Therapeutic Skin Cream  NutraDerm Therapeutic Skin Lotion  ProShield Protective Hand Cream  Provon moisturizing lotion  Please read over the following fact sheets that you  were given.

## 2023-11-18 NOTE — Progress Notes (Addendum)
 PCP - Dr. Selinda Salinas Eyk Cardiologist - Dr. Redell Leiter - last office visit 11/11/2023 Heart Failure - Dr. Ezra Shuck - last office visit 10/02/2023 with PA Pulmonologist - Dr. Lamar Chris, but only see's Almarie Ferrari, PA  PPM/ICD - Denies Device Orders - n/a Rep Notified - n/a  Chest x-ray - n/a EKG - 11/11/2023 Stress Test - 10/27/2017 ECHO - 09/05/2023 Cardiac Cath - Denies  Sleep Study - +OSA. Pt has not worn his BiPAP lately due to incorrect mask fit and increased back pain.  No DM  Last dose of GLP1 agonist- n/a GLP1 instructions: n/a  Blood Thinner Instructions: Pt instructed to hold Xarelto  three days prior to surgery. Last dose will be on 11/13 Aspirin Instructions: n/a  NPO after midnight  COVID TEST- n/a   Anesthesia review: Yes. Cardiac clearance. Discussed with Allison Zelenak, PA-C. Pt instructed to continue Farxiga  through the morning of surgery.  Patient denies shortness of breath, fever, cough and chest pain at PAT appointment. Pt denies any respiratory illness/infection in the last two months.   All instructions explained to the patient, with a verbal understanding of the material. Patient agrees to go over the instructions while at home for a better understanding. Patient also instructed to self quarantine after being tested for COVID-19. The opportunity to ask questions was provided.

## 2023-11-18 NOTE — Progress Notes (Signed)
 Anesthesia APP PAT Evaluation:  Case: [id number] Date/Time: 11/24/23 1004   Procedures:      LAMINECTOMY WITH POSTERIOR LATERAL ARTHRODESIS LEVEL 3 (Back) - Posterior lateral fusion with instrumentation T7-T10, extension of construct with connector     PERONEAL NERVE DECOMPRESSION (Right) - Right peroneal nerve decompression   Anesthesia type: General   Diagnosis: Compression fracture of T10 vertebra, initial encounter (HCC) [S22.070A]   Pre-op diagnosis: Compression fracture of T10 vertebra   [facility]: [facility] ROOM [facility] / [facility]   Surgeons: Onetha Kuba, MD       DISCUSSION:  Patient is a 79 year old male scheduled for the above procedure.    History includes never smoker, HTN, hypercholesterolemia, afib, CAD, cardiomyopathy, murmur (mild-moderate MR, moderate AR), ascending TAA, PE, prostate cancer, BPH, CKD, GI bleed, OSA (uses BiPAP), spinal surgery (L1 transpedicular decompression, T10-L4 posterolateral arthrodesis/pedical screw fixation 11/02/2024).   Dr. Monetta on 11/11/2023 Harlene Gainer, NP on 10/02/2023   He had a CCTA on 07/17/2023 that showed CAC 1959 (88th percentile) with 25-49% mid LAD and 50-69% D1 with FFR ct showing no significant stenosis. 07/04/2023 TTE showed LVEF 35-40%, global hypokinesis, mild-moderately dilated LV internal cavity, normal RV systolic function, LA severely dilated, mild-moderate MR, moderate AR, ascending aorta 56 mm.   He had CT surgery visit with Dr. Kerrin on 11/04/2023.  08/12/2023. By recent CT in July 2025, ascending TAA measures approximately 5.4 to 5.5 cm at the sinuses of Valsalva (tortuous aorta make precise measurement difficult.). Mid ascending aorta 4.8 cm when measured at the level of the right pulmonary artery, unchanged from 2021. Semiannual follow-up recommended.    Last out-patient pulmonology visit noted is from 08/13/2023 with Hope Norris for preoperative evaluation prior to his previously back surgery. She noted, He has severe sleep  apnea and uses a BiPAP machine with settings of 19/15. Despite compliance, he experiences approximately twenty-six apneic events per hour. A titration study from October of the previous year showed severe sleep apnea with fifty-nine apneas per hour. Obstructive apneas were controlled on CPAP of fifteen, but central apneas led to the switch to BiPAP. He has tried different masks, but all have issues with air leaks.    VS: BP (!) 107/42   Pulse 63   Temp 36.7 C   Resp 18   Ht 5' 10 (1.778 m)   Wt 75.3 kg   SpO2 97%   BMI 23.82 kg/m  Radial pulse regular.    PROVIDERS: Fleeta Valeria Mayo, MD is PCP Monetta Rogue, MD is cardiologist Rolan Barrack, MD is HF cardiologist Inocencio Chi, MD is EP cardiologist (saw in-patient in 08/2023) Kerrin Standing, MD is CT surgeon Hope Norris, NP is pulmonology provider ([facility])   LABS: {CH[facility] AN LABS REVIEWED:112001::Labs reviewed: Acceptable for surgery.} (all labs ordered are listed, but only abnormal results are displayed)  Labs Reviewed  SURGICAL PCR SCREEN - Abnormal; Notable for the following components:      Result Value   MRSA, PCR POSITIVE (*)    Staphylococcus aureus POSITIVE (*)    All other components within normal limits  BASIC METABOLIC PANEL WITH GFR - Abnormal; Notable for the following components:   Glucose, Bld 147 (*)    BUN 43 (*)    Creatinine, Ser 1.90 (*)    GFR, Estimated 36 (*)    All other components within normal limits  CBC - Abnormal; Notable for the following components:   Hemoglobin 12.9 (*)    All other components within normal  limits  TYPE AND SCREEN    Split Night Sleep Study with BiPAP 10/22/2022: IMPRESSIONS - Severe obstructive sleep apnea occurred during the diagnostic portion of the study (AHI = 59.8 /hour). - Obstructive apneas were controlled by CPAP 15, however persistent central apneas led change to BIPAP titration. - An optimal BiPAP pressure was selected for this patient ( 19 / 15 cm of  water). - Mild central sleep apnea occurred during the diagnostic portion of the study (CAI = 5.5/hour). - Moderate oxygen desaturation was noted during the diagnostic portion of the study (Min O2 = 75.0%). On BIPAP 19/15, minimum O2 saturation was 88%, Mean 92.6%. - The patient snored with moderate snoring volume during the diagnostic portion of the study. - EKG findings include PVCs. - Clinically significant periodic limb movements of sleep did not occur during the study. - Sleep talking noted. - Sleep remained fragmented throughout with frequent breif arousals and awakening.    RECOMMENDATIONS - Trial of BiPAP therapy on 19/15 cm H2O. - Patient used a Large size [employer] Full Face Simplus mask and heated humidification...     IMAGES: MRI T-spine 11/06/2023: IMPRESSION: 1. Old compression fracture of L1 with previous vertebral augmentation and thoracolumbar fusion that begins at the T10 level 2. Acute or subacute benign compression fractures of the inferior endplate of T9 and superior endplate of T10 with only slight loss of vertebral height.   CT T-spine 10/29/2023: IMPRESSION: 1. Irregularity of the T9 inferior endplate with approximately 30% central height loss, without significant retropulsion. 2. Mild irregularity of the T10 superior endplate with mild anterior wedging and approximately 15% height loss. 3. Sclerotic lesion in the left inferior T8 vertebral body, 1.7 x 1.8 x 1.1 cm, significantly increased in size from recent prior studies. Recommend contrast-enhanced MRI for further evaluation. 4. L1 compression fracture treated with kyphoplasty and posterior instrumentation. 5. Mild multilevel thoracic degenerative changes including endplate osteophytes and facet arthrosis.  CTA Chest 07/24/2023: Aortic Root: --Valve: 3.2 x 2.6 cm --Sinuses: 5.3 x 4.7 x 4.7 cm --Sinotubular Junction: 5.5 x 5.5 cm   Thoracic Aorta: --Ascending Aorta: 5.4 x 5.3 cm --Aortic Arch: 3.9  x 3.8 cm --Descending Aorta: 3.4 x 3.4 cm   IMPRESSION: 1. Aneurysmal dilatation of the sino-tubular junction and ascending thoracic aorta, unchanged. 2. Similar marked multichamber cardiomegaly. 3. Subsegmental mucous plugging in the right lower lobe, which may reflect bronchitis or aspiration. 4. Aortic Atherosclerosis (ICD10-I70.0). Coronary artery calcifications. Assessment for potential risk factor modification, dietary therapy or pharmacologic therapy may be warranted, if clinically indicated.        EKG:  EKG 11/11/2023: Normal sinus rhythm with sinus arrhythmia Moderate voltage criteria for LVH, may be normal variant ( R in aVL , Cornell product ) Nonspecific ST abnormality When compared with ECG of 02-Oct-2023 14:11, No significant change was found Confirmed by Monetta Rogue ([id number]) on 11/11/2023 1:22:14 PM    CV: Cardiac MRI 09/11/2023: IMPRESSION: 1.  Severely dilated ascending aorta and root, 51 mm. 2. Severely dilated left ventricle with normal wall thickness, global hypokinesis with LV EF 40%. 3.  Normal RV size with RV EF 37%. 4. There is severe aortic insufficiency with regurgitant fraction 50% and regurgitant volume 59 mL. Unable to tell cusp number on the aortic valve from this study. 5. Delayed enhancement imaging with a small area of subendocardial LGE in the basal inferolateral wall that looks consistent with small prior MI. There is mid-wall LGE at the inferior RV insertion site that is nonspecific  and tends to be related to pressure/volume overload. 6. Elevated extracellular volume percentage at 35% suggests increased myocardial fibrotic content. - Severe aortic insufficiency in the setting of dilated ascending aorta and aortic root with LV dilation.   Echo 09/05/2023: IMPRESSIONS   1. Left ventricular ejection fraction, by estimation, is 30 to 35%. Left  ventricular ejection fraction by 2D MOD biplane is 33.0 %. The left  ventricle has moderately  decreased function. The left ventricle  demonstrates global hypokinesis. Left  ventricular diastolic function could not be evaluated.   2. Right ventricular systolic function is mildly reduced. The right  ventricular size is normal.   3. Left atrial size was moderately dilated.   4. Right atrial size was mildly dilated.   5. The mitral valve is degenerative. Mild mitral valve regurgitation.   6. The aortic valve was not well visualized. Aortic valve regurgitation  is moderate.   7. Aortic dilatation noted. There is severe dilatation of the ascending  aorta, measuring 56 mm.   8. Rhythm strip during this exam demonstrates premature ventricular  contractions and atrial fibrillation.  Comparison(s): Changes from prior study are noted. 07/04/2023: LVEF 35-40%.  - Comparison 07/04/2023: LVEF 35-40%, LV global hypokinesis, LV cavity mildly to moderately dilated, normal RV systolic function, severely dilated LA, moderately dilated RA, mild-moderate MR, moderate AR, ascending aorta 56 mm.   CTA Coronary 07/17/2023: - Aorta: The proximal ascending aorta is anuerysm (52 mm) in size. No calcifications. No dissection. - Aortic Valve:  Trileaflet.  No calcifications. - Coronary Arteries:  Normal coronary origin. Right Dominance. - RCA is a large dominant artery that gives rise to PDA and PLA. The RCA is a torturous artery. In the Proximal RCA there is a mild focal calcification. Mid RCA in a bend there appears to be a mild soft plaque with minimal diffuse calcifications. Distal RCA with two separate minimal mixed plaques. - Left main is a large artery that gives rise to LAD and LCX arteries. Minimal calcification in the mid left main vessel. - LAD is a large vessel. There is a focal mild calcified plaque in the ostial LAD. There is diffuse minimal (<24%) calcified plaque in the proximal LAD. The mid LAD with mild (25-49%) diffuse calcifications right below is a mid soft plaque. The distal LAD with  minimal focal plaques. - First diagonal branch is a medium sized vessel with moderate (50-69%) soft plaque in the proximal portion of the vessel. - LCX is a non-dominant artery that gives rise to one large OM1 branch. Diffuse minimal calcifications throughout the LCX artery. - First obtuse marginal with mild calcified plaque in the proximal portion of the vessel.  - Coronary Calcium  Score: Left main: 29.3 Left anterior descending artery: 918 Left circumflex artery: 391 Right coronary artery: 621 Total: 1959 Percentile: 88 - Other findings: Normal pulmonary vein drainage into the left atrium. Normal left atrial appendage without a thrombus. Normal size of the pulmonary artery.   IMPRESSION: 1. Coronary calcium  score of 1959. This was 36 percentile for age and sex matched control. 2. Normal coronary origin with right dominance. 3. CAD-RADS 3. Moderate stenosis. Consider symptom-guided anti-ischemic pharmacotherapy as well as risk factor modification per guideline directed care. Additional analysis with CT FFR . 4. Total plaque volume 2015 mm3 which is 94th percentile for age- and, sex-matched controls (calcified plaque 441 mm3; non-calcified plaque 1574 mm3, Low attenuation 8 mm3). TPV is extensive.    CT FFR 07/17/2023: IMPRESSION: 1.  CT FFR analysis showed  no significant stenosis. RECOMMENDATIONS: Guideline-directed medical therapy and aggressive risk factor modification for secondary prevention of coronary artery disease.        Past Medical History:  Diagnosis Date   Aneurysm of aortic root    Overview:  Last Assessment & Plan:  Planning to see Dr Army for evaluation of 5cm aneurysm.  - will check PFT in prep for possible procedure / SGY   Aortic atherosclerosis 06/14/2022   Aortic regurgitation    Aortic valve insufficiency    Ascending aortic aneurysm 09/11/2016   Atherosclerotic vascular disease 05/27/2022   Atrial fibrillation (HCC)    Atrial fibrillation  with RVR (HCC) 07/05/2022   BMI 27.0-27.9,adult 06/14/2022   BPH (benign prostatic hyperplasia)    BRBPR (bright red blood per rectum) 05/20/2023   Cardiomyopathy (HCC) 09/11/2016   CHF (congestive heart failure) (HCC)    CKD (chronic kidney disease) stage 2, GFR 60-89 ml/min 11/23/2021   Closed fracture of right hip (HCC) 03/10/2023   Coronary artery calcification seen on CT scan 10/13/2017   Dysrhythmia    A. Fib   Essential hypertension 06/14/2022   Gastrointestinal hemorrhage 05/20/2023   Heart murmur    High risk medication use 07/31/2022   History of cardiomyopathy 11/09/2021   History of pulmonary embolism 09/24/2012   Overview:  Last Assessment & Plan:  Hx PE x 2 per notes, ? 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  - needs auto-titration study by [employer] or local company, data to RB to adjust his home device.    OSA (obstructive sleep apnea) 09/13/2022   Prediabetes    Primary osteoarthritis involving multiple joints 06/14/2022   Prostate cancer metastatic to bone (HCC) 06/14/2022   Pulmonary embolism (HCC)    Rhinitis    Sacral wound 04/11/2023   Seasonal allergies 06/14/2022   Spondylolisthesis of lumbar region 12/12/2021   Stage 3a chronic kidney disease (HCC) 06/14/2022   Unstageable pressure ulcer of sacral region (HCC) 03/10/2023    Past Surgical History:  Procedure Laterality Date   APPENDECTOMY      COLONOSCOPY N/A 05/23/2023   Procedure: COLONOSCOPY;  Surgeon: Elicia Claw, MD;  Location: [facility];  Service: Gastroenterology;  Laterality: N/A;   INGUINAL HERNIA REPAIR Right    KYPHOPLASTY     LAMINECTOMY WITH POSTERIOR LATERAL ARTHRODESIS LEVEL 4 N/A 09/03/2023   Procedure: Posterior lateral fusion - Thoracic Ten-Thoracic Eleven - Thoracic Eleven -Thoracic Twelve - Thoracic Twelve-Lumbar One - Lumbar One-Lumbar Two - Lumbar Two-Lumbar Three - Lumbar Three-Lumbar Four, laminectomy and foraminotomy transpedicular decompression Lumbar One;  Surgeon: Onetha Kuba, MD;  Location: [facility] OR;  Service: Neurosurgery;  Laterality: N/A;  Posterior lateral fusion - T10-T11    LUMBAR LAMINECTOMY  02/06/2022   Facetectom & foraminotomy for decompression of the cauda equina & nerve root L4-5, Arley PHEBE Dark, MD   TONSILLECTOMY     TOTAL HIP ARTHROPLASTY Right    TOTAL KNEE ARTHROPLASTY Right  MEDICATIONS:  acetaminophen  (TYLENOL ) 500 MG tablet   amiodarone  (PACERONE ) 200 MG tablet   Cyanocobalamin  (B-12) 5000 MCG CAPS   dapagliflozin  propanediol (FARXIGA ) 10 MG TABS tablet   doxycycline  (VIBRA -TABS) 100 MG tablet   methocarbamol  (ROBAXIN ) 500 MG tablet   metoprolol  succinate (TOPROL -XL) 25 MG 24 hr tablet   Multiple Vitamins-Minerals (MENS 50+ MULTIVITAMIN) TABS   pantoprazole  (PROTONIX ) 40 MG tablet   potassium chloride  SA (KLOR-CON  M20) 20 MEQ tablet   Rivaroxaban  (XARELTO ) 15 MG TABS tablet   rosuvastatin  (CRESTOR ) 10 MG tablet   spironolactone  (ALDACTONE ) 25 MG tablet   tamsulosin  (FLOMAX ) 0.4 MG CAPS capsule   torsemide  (DEMADEX ) 10 MG tablet   traMADol  (ULTRAM ) 50 MG tablet   No current facility-administered medications for this encounter.

## 2023-11-20 NOTE — Anesthesia Preprocedure Evaluation (Addendum)
 Anesthesia Evaluation  Patient identified by MRN, date of birth, ID band Patient awake    Reviewed: Allergy & Precautions, NPO status , Patient's Chart, lab work & pertinent test results  Airway Mallampati: III  TM Distance: >3 FB Neck ROM: Full    Dental no notable dental hx.    Pulmonary sleep apnea and Continuous Positive Airway Pressure Ventilation , PE   Pulmonary exam normal        Cardiovascular hypertension, Pt. on home beta blockers +CHF  + dysrhythmias Atrial Fibrillation + Valvular Problems/Murmurs AI  Rhythm:Regular Rate:Normal + Diastolic murmurs ECHO: 1. Left ventricular ejection fraction, by estimation, is 30 to 35%. Left  ventricular ejection fraction by 2D MOD biplane is 33.0 %. The left  ventricle has moderately decreased function. The left ventricle  demonstrates global hypokinesis. Left  ventricular diastolic function could not be evaluated.   2. Right ventricular systolic function is mildly reduced. The right  ventricular size is normal.   3. Left atrial size was moderately dilated.   4. Right atrial size was mildly dilated.   5. The mitral valve is degenerative. Mild mitral valve regurgitation.   6. The aortic valve was not well visualized. Aortic valve regurgitation  is moderate.   7. Aortic dilatation noted. There is severe dilatation of the ascending  aorta, measuring 56 mm.   8. Rhythm strip during this exam demonstrates premature ventricular  contractions and atrial fibrillation.     Neuro/Psych    GI/Hepatic negative GI ROS, Neg liver ROS,,,  Endo/Other  negative endocrine ROS    Renal/GU CRFRenal disease     Musculoskeletal   Abdominal   Peds  Hematology  (+) Blood dyscrasia (Xarelto )   Anesthesia Other Findings Compression fracture of T10 vertebra  Reproductive/Obstetrics                              Anesthesia Physical Anesthesia Plan  ASA:  4  Anesthesia Plan: General   Post-op Pain Management:    Induction: Intravenous  PONV Risk Score and Plan: 2 and Ondansetron , Dexamethasone  and Treatment may vary due to age or medical condition  Airway Management Planned: Oral ETT  Additional Equipment: Arterial line  Intra-op Plan:   Post-operative Plan: Extubation in OR  Informed Consent: I have reviewed the patients History and Physical, chart, labs and discussed the procedure including the risks, benefits and alternatives for the proposed anesthesia with the patient or authorized representative who has indicated his/her understanding and acceptance.     Dental advisory given  Plan Discussed with: CRNA  Anesthesia Plan Comments: (PAT note written 11/20/2023 by Allison Zelenak, PA-C. Post-operative unstable SVT requiring shock x3. Has moderate-severe AI with aortic root aneurysm. Recent cardiology and CT surgery evaluations.  For repeat BMP on arrival.  )         Anesthesia Quick Evaluation

## 2023-11-20 NOTE — Progress Notes (Signed)
 Voicemail left with Shanda, [place] scheduler for Dr. Onetha, regarding surgical PCR result +MRSA and +MSSA

## 2023-11-24 ENCOUNTER — Inpatient Hospital Stay (HOSPITAL_COMMUNITY)

## 2023-11-24 ENCOUNTER — Inpatient Hospital Stay (HOSPITAL_COMMUNITY): Payer: Self-pay | Admitting: Vascular Surgery

## 2023-11-24 ENCOUNTER — Inpatient Hospital Stay (HOSPITAL_COMMUNITY)
Admission: RE | Admit: 2023-11-24 | Discharge: 2023-11-25 | DRG: 448 | Disposition: A | Discharge status: Home / Self Care | Attending: Neurosurgery | Admitting: Neurosurgery

## 2023-11-24 ENCOUNTER — Encounter (HOSPITAL_COMMUNITY): Payer: Self-pay | Admitting: Neurosurgery

## 2023-11-24 ENCOUNTER — Inpatient Hospital Stay (HOSPITAL_COMMUNITY)
Admission: RE | Disposition: A | Discharge status: Home / Self Care | Payer: Self-pay | Source: Home / Self Care | Attending: Neurosurgery

## 2023-11-24 DIAGNOSIS — Z7901 Long term (current) use of anticoagulants: Secondary | ICD-10-CM | POA: Diagnosis not present

## 2023-11-24 DIAGNOSIS — M4854XA Collapsed vertebra, not elsewhere classified, thoracic region, initial encounter for fracture: Secondary | ICD-10-CM | POA: Diagnosis not present

## 2023-11-24 DIAGNOSIS — Z01818 Encounter for other preprocedural examination: Secondary | ICD-10-CM

## 2023-11-24 DIAGNOSIS — Z833 Family history of diabetes mellitus: Secondary | ICD-10-CM

## 2023-11-24 DIAGNOSIS — M21371 Foot drop, right foot: Secondary | ICD-10-CM | POA: Diagnosis not present

## 2023-11-24 DIAGNOSIS — M8448XA Pathological fracture, other site, initial encounter for fracture: Secondary | ICD-10-CM | POA: Diagnosis not present

## 2023-11-24 DIAGNOSIS — I130 Hypertensive heart and chronic kidney disease with heart failure and stage 1 through stage 4 chronic kidney disease, or unspecified chronic kidney disease: Secondary | ICD-10-CM | POA: Diagnosis present

## 2023-11-24 DIAGNOSIS — N182 Chronic kidney disease, stage 2 (mild): Secondary | ICD-10-CM

## 2023-11-24 DIAGNOSIS — Z823 Family history of stroke: Secondary | ICD-10-CM | POA: Diagnosis not present

## 2023-11-24 DIAGNOSIS — Z8546 Personal history of malignant neoplasm of prostate: Secondary | ICD-10-CM

## 2023-11-24 DIAGNOSIS — M47814 Spondylosis without myelopathy or radiculopathy, thoracic region: Secondary | ICD-10-CM | POA: Diagnosis not present

## 2023-11-24 DIAGNOSIS — Z8249 Family history of ischemic heart disease and other diseases of the circulatory system: Secondary | ICD-10-CM | POA: Diagnosis not present

## 2023-11-24 DIAGNOSIS — I429 Cardiomyopathy, unspecified: Secondary | ICD-10-CM | POA: Diagnosis not present

## 2023-11-24 DIAGNOSIS — S22079A Unspecified fracture of T9-T10 vertebra, initial encounter for closed fracture: Secondary | ICD-10-CM | POA: Diagnosis not present

## 2023-11-24 DIAGNOSIS — Z79899 Other long term (current) drug therapy: Secondary | ICD-10-CM

## 2023-11-24 DIAGNOSIS — Z96641 Presence of right artificial hip joint: Secondary | ICD-10-CM | POA: Diagnosis not present

## 2023-11-24 DIAGNOSIS — I5022 Chronic systolic (congestive) heart failure: Secondary | ICD-10-CM | POA: Diagnosis not present

## 2023-11-24 DIAGNOSIS — Z96651 Presence of right artificial knee joint: Secondary | ICD-10-CM | POA: Diagnosis present

## 2023-11-24 DIAGNOSIS — I4891 Unspecified atrial fibrillation: Secondary | ICD-10-CM | POA: Diagnosis not present

## 2023-11-24 DIAGNOSIS — Z981 Arthrodesis status: Secondary | ICD-10-CM | POA: Diagnosis not present

## 2023-11-24 DIAGNOSIS — I509 Heart failure, unspecified: Secondary | ICD-10-CM | POA: Diagnosis present

## 2023-11-24 DIAGNOSIS — E7800 Pure hypercholesterolemia, unspecified: Secondary | ICD-10-CM | POA: Diagnosis not present

## 2023-11-24 DIAGNOSIS — I2510 Atherosclerotic heart disease of native coronary artery without angina pectoris: Secondary | ICD-10-CM | POA: Diagnosis not present

## 2023-11-24 DIAGNOSIS — S22070A Wedge compression fracture of T9-T10 vertebra, initial encounter for closed fracture: Secondary | ICD-10-CM

## 2023-11-24 DIAGNOSIS — N400 Enlarged prostate without lower urinary tract symptoms: Secondary | ICD-10-CM | POA: Diagnosis not present

## 2023-11-24 DIAGNOSIS — N1831 Chronic kidney disease, stage 3a: Secondary | ICD-10-CM | POA: Diagnosis not present

## 2023-11-24 DIAGNOSIS — R7303 Prediabetes: Secondary | ICD-10-CM | POA: Diagnosis not present

## 2023-11-24 DIAGNOSIS — Z820 Family history of epilepsy and other diseases of the nervous system: Secondary | ICD-10-CM | POA: Diagnosis not present

## 2023-11-24 DIAGNOSIS — Z881 Allergy status to other antibiotic agents status: Secondary | ICD-10-CM

## 2023-11-24 DIAGNOSIS — C7951 Secondary malignant neoplasm of bone: Secondary | ICD-10-CM | POA: Diagnosis not present

## 2023-11-24 DIAGNOSIS — G5731 Lesion of lateral popliteal nerve, right lower limb: Secondary | ICD-10-CM | POA: Diagnosis not present

## 2023-11-24 DIAGNOSIS — R7989 Other specified abnormal findings of blood chemistry: Secondary | ICD-10-CM

## 2023-11-24 DIAGNOSIS — M549 Dorsalgia, unspecified: Secondary | ICD-10-CM | POA: Diagnosis present

## 2023-11-24 DIAGNOSIS — Z86711 Personal history of pulmonary embolism: Secondary | ICD-10-CM | POA: Diagnosis not present

## 2023-11-24 DIAGNOSIS — S22000A Wedge compression fracture of unspecified thoracic vertebra, initial encounter for closed fracture: Principal | ICD-10-CM | POA: Diagnosis present

## 2023-11-24 HISTORY — PX: PERONEAL NERVE DECOMPRESSION: SHX2226

## 2023-11-24 HISTORY — PX: LAMINECTOMY WITH POSTERIOR LATERAL ARTHRODESIS LEVEL 3: SHX6337

## 2023-11-24 LAB — BASIC METABOLIC PANEL WITH GFR
Anion gap: 13 (ref 5–15)
BUN: 37 mg/dL — ABNORMAL HIGH (ref 8–23)
CO2: 22 mmol/L (ref 22–32)
Calcium: 9.2 mg/dL (ref 8.9–10.3)
Chloride: 103 mmol/L (ref 98–111)
Creatinine, Ser: 1.79 mg/dL — ABNORMAL HIGH (ref 0.61–1.24)
GFR, Estimated: 39 mL/min — ABNORMAL LOW (ref >60)
Glucose, Bld: 102 mg/dL — ABNORMAL HIGH (ref 70–99)
Potassium: 4.3 mmol/L (ref 3.5–5.1)
Sodium: 138 mmol/L (ref 135–145)

## 2023-11-24 SURGERY — LAMINECTOMY WITH POSTERIOR LATERAL ARTHRODESIS LEVEL 3
Anesthesia: General | Site: Back | Laterality: Right

## 2023-11-24 MED ORDER — CHLORHEXIDINE GLUCONATE CLOTH 2 % EX PADS: 6.0000 | MEDICATED_PAD | Freq: Once | CUTANEOUS | Status: DC

## 2023-11-24 MED ORDER — THROMBIN 20000 UNITS EX SOLR
CUTANEOUS | Status: AC
  Filled 2023-11-24: qty 20000

## 2023-11-24 MED ORDER — MENTHOL 3 MG MT LOZG: 1.0000 | LOZENGE | OROMUCOSAL | Status: DC | PRN

## 2023-11-24 MED ORDER — ACETAMINOPHEN 325 MG PO TABS: 650.0000 mg | ORAL_TABLET | ORAL | Status: DC | PRN

## 2023-11-24 MED ORDER — HYDROMORPHONE HCL 1 MG/ML IJ SOLN: 0.5000 mg | INTRAMUSCULAR | Status: DC | PRN

## 2023-11-24 MED ORDER — ONDANSETRON HCL 4 MG/2ML IJ SOLN: 4.0000 mg | Freq: Once | INTRAMUSCULAR | Status: DC | PRN

## 2023-11-24 MED ORDER — LIDOCAINE-EPINEPHRINE 1 %-1:100000 IJ SOLN
INTRAMUSCULAR | Status: DC | PRN
  Administered 2023-11-24: 10 mL
  Administered 2023-11-24: 7 mL

## 2023-11-24 MED ORDER — THROMBIN 20000 UNITS EX KIT
PACK | CUTANEOUS | Status: AC
  Filled 2023-11-24: qty 1

## 2023-11-24 MED ORDER — CYCLOBENZAPRINE HCL 10 MG PO TABS: 10.0000 mg | ORAL_TABLET | Freq: Three times a day (TID) | ORAL | Status: DC | PRN

## 2023-11-24 MED ORDER — SODIUM CHLORIDE 0.9% FLUSH: 3.0000 mL | INTRAVENOUS | Status: DC | PRN

## 2023-11-24 MED ORDER — LIDOCAINE 2% (20 MG/ML) 5 ML SYRINGE
INTRAMUSCULAR | Status: AC
  Filled 2023-11-24: qty 5

## 2023-11-24 MED ORDER — DEXAMETHASONE SOD PHOSPHATE PF 10 MG/ML IJ SOLN
INTRAMUSCULAR | Status: DC | PRN
  Administered 2023-11-24: 8 mg via INTRAVENOUS

## 2023-11-24 MED ORDER — VANCOMYCIN HCL 1250 MG/250ML IV SOLN
1250.0000 mg | INTRAVENOUS | Status: AC
Start: 2023-11-24 — End: 2023-11-24
  Administered 2023-11-24: 1250 mg via INTRAVENOUS
  Filled 2023-11-24 (×2): qty 250

## 2023-11-24 MED ORDER — PANTOPRAZOLE SODIUM 40 MG IV SOLR
40.0000 mg | Freq: Every day | INTRAVENOUS | Status: DC
Start: 2023-11-24 — End: 2023-11-24

## 2023-11-24 MED ORDER — BUPIVACAINE HCL (PF) 0.25 % IJ SOLN
INTRAMUSCULAR | Status: AC
  Filled 2023-11-24: qty 30

## 2023-11-24 MED ORDER — PROPOFOL 10 MG/ML IV BOLUS
INTRAVENOUS | Status: DC | PRN
Start: 2023-11-24 — End: 2023-11-24
  Administered 2023-11-24: 100 mg via INTRAVENOUS

## 2023-11-24 MED ORDER — SODIUM CHLORIDE 0.9% FLUSH
3.0000 mL | Freq: Two times a day (BID) | INTRAVENOUS | Status: DC
  Administered 2023-11-24: 3 mL via INTRAVENOUS

## 2023-11-24 MED ORDER — MUPIROCIN 2 % EX OINT: 1.0000 | TOPICAL_OINTMENT | Freq: Two times a day (BID) | CUTANEOUS | 0 refills | Status: AC

## 2023-11-24 MED ORDER — SPIRONOLACTONE 25 MG PO TABS
25.0000 mg | ORAL_TABLET | Freq: Every day | ORAL | Status: DC
  Administered 2023-11-24 – 2023-11-25 (×2): 25 mg via ORAL
  Filled 2023-11-24 (×2): qty 1

## 2023-11-24 MED ORDER — ACETAMINOPHEN 650 MG RE SUPP: 650.0000 mg | RECTAL | Status: DC | PRN

## 2023-11-24 MED ORDER — METOPROLOL SUCCINATE ER 25 MG PO TB24
12.5000 mg | ORAL_TABLET | Freq: Two times a day (BID) | ORAL | Status: DC
  Administered 2023-11-24 – 2023-11-25 (×2): 12.5 mg via ORAL
  Filled 2023-11-24 (×3): qty 1

## 2023-11-24 MED ORDER — CEFAZOLIN SODIUM-DEXTROSE 2-4 GM/100ML-% IV SOLN
2.0000 g | INTRAVENOUS | Status: AC
  Administered 2023-11-24: 2 g via INTRAVENOUS
  Filled 2023-11-24: qty 100

## 2023-11-24 MED ORDER — ROSUVASTATIN CALCIUM 5 MG PO TABS
10.0000 mg | ORAL_TABLET | Freq: Every evening | ORAL | Status: DC
  Administered 2023-11-24: 10 mg via ORAL
  Filled 2023-11-24: qty 2

## 2023-11-24 MED ORDER — FENTANYL CITRATE (PF) 250 MCG/5ML IJ SOLN
INTRAMUSCULAR | Status: DC | PRN
  Administered 2023-11-24 (×2): 50 ug via INTRAVENOUS
  Administered 2023-11-24 (×2): 25 ug via INTRAVENOUS
  Administered 2023-11-24: 75 ug via INTRAVENOUS
  Administered 2023-11-24: 25 ug via INTRAVENOUS

## 2023-11-24 MED ORDER — EPHEDRINE SULFATE-NACL 50-0.9 MG/10ML-% IV SOSY
PREFILLED_SYRINGE | INTRAVENOUS | Status: DC | PRN
  Administered 2023-11-24: 5 mg via INTRAVENOUS
  Administered 2023-11-24: 10 mg via INTRAVENOUS
  Administered 2023-11-24: 5 mg via INTRAVENOUS
  Administered 2023-11-24: 7.5 mg via INTRAVENOUS
  Administered 2023-11-24: 5 mg via INTRAVENOUS
  Administered 2023-11-24: 7.5 mg via INTRAVENOUS
  Administered 2023-11-24: 5 mg via INTRAVENOUS

## 2023-11-24 MED ORDER — LACTATED RINGERS IV SOLN: INTRAVENOUS | Status: DC

## 2023-11-24 MED ORDER — SODIUM CHLORIDE 0.9 % IV SOLN
250.0000 mL | INTRAVENOUS | Status: DC
  Administered 2023-11-24: 250 mL via INTRAVENOUS

## 2023-11-24 MED ORDER — ONDANSETRON HCL 4 MG PO TABS: 4.0000 mg | ORAL_TABLET | Freq: Four times a day (QID) | ORAL | Status: DC | PRN

## 2023-11-24 MED ORDER — ROCURONIUM BROMIDE 10 MG/ML (PF) SYRINGE
PREFILLED_SYRINGE | INTRAVENOUS | Status: DC | PRN
Start: 2023-11-24 — End: 2023-11-24
  Administered 2023-11-24: 60 mg via INTRAVENOUS

## 2023-11-24 MED ORDER — TAMSULOSIN HCL 0.4 MG PO CAPS: 0.4000 mg | ORAL_CAPSULE | Freq: Every day | ORAL | Status: DC

## 2023-11-24 MED ORDER — ONDANSETRON HCL 4 MG/2ML IJ SOLN
INTRAMUSCULAR | Status: AC
  Filled 2023-11-24: qty 2

## 2023-11-24 MED ORDER — ALUM & MAG HYDROXIDE-SIMETH 200-200-20 MG/5ML PO SUSP: 30.0000 mL | Freq: Four times a day (QID) | ORAL | Status: DC | PRN

## 2023-11-24 MED ORDER — TRAMADOL HCL 50 MG PO TABS: 100.0000 mg | ORAL_TABLET | Freq: Three times a day (TID) | ORAL | Status: DC | PRN

## 2023-11-24 MED ORDER — PHENYLEPHRINE 80 MCG/ML (10ML) SYRINGE FOR IV PUSH (FOR BLOOD PRESSURE SUPPORT)
PREFILLED_SYRINGE | INTRAVENOUS | Status: DC | PRN
  Administered 2023-11-24: 40 ug via INTRAVENOUS
  Administered 2023-11-24 (×2): 80 ug via INTRAVENOUS
  Administered 2023-11-24: 120 ug via INTRAVENOUS

## 2023-11-24 MED ORDER — CEFAZOLIN SODIUM-DEXTROSE 2-4 GM/100ML-% IV SOLN
2.0000 g | Freq: Three times a day (TID) | INTRAVENOUS | Status: AC
  Administered 2023-11-24 – 2023-11-25 (×2): 2 g via INTRAVENOUS
  Filled 2023-11-24 (×2): qty 100

## 2023-11-24 MED ORDER — AMISULPRIDE (ANTIEMETIC) 5 MG/2ML IV SOLN: 10.0000 mg | Freq: Once | INTRAVENOUS | Status: DC | PRN

## 2023-11-24 MED ORDER — CHLORHEXIDINE GLUCONATE 0.12 % MT SOLN
15.0000 mL | Freq: Once | OROMUCOSAL | Status: AC
  Administered 2023-11-24: 15 mL via OROMUCOSAL
  Filled 2023-11-24: qty 15

## 2023-11-24 MED ORDER — LIDOCAINE-EPINEPHRINE 1 %-1:100000 IJ SOLN
INTRAMUSCULAR | Status: AC
  Filled 2023-11-24: qty 1

## 2023-11-24 MED ORDER — ACETAMINOPHEN 10 MG/ML IV SOLN
1000.0000 mg | Freq: Once | INTRAVENOUS | Status: DC | PRN
  Administered 2023-11-24: 1000 mg via INTRAVENOUS

## 2023-11-24 MED ORDER — ONDANSETRON HCL 4 MG/2ML IJ SOLN: 4.0000 mg | Freq: Four times a day (QID) | INTRAMUSCULAR | Status: DC | PRN

## 2023-11-24 MED ORDER — SUGAMMADEX SODIUM 200 MG/2ML IV SOLN
INTRAVENOUS | Status: DC | PRN
  Administered 2023-11-24 (×2): 100 mg via INTRAVENOUS

## 2023-11-24 MED ORDER — BUPIVACAINE LIPOSOME 1.3 % IJ SUSP
INTRAMUSCULAR | Status: DC | PRN
  Administered 2023-11-24: 20 mL

## 2023-11-24 MED ORDER — ORAL CARE MOUTH RINSE: 15.0000 mL | Freq: Once | OROMUCOSAL | Status: AC

## 2023-11-24 MED ORDER — FENTANYL CITRATE (PF) 100 MCG/2ML IJ SOLN: 25.0000 ug | INTRAMUSCULAR | Status: DC | PRN

## 2023-11-24 MED ORDER — ACETAMINOPHEN 500 MG PO TABS: 500.0000 mg | ORAL_TABLET | Freq: Four times a day (QID) | ORAL | Status: DC

## 2023-11-24 MED ORDER — ONDANSETRON HCL 4 MG/2ML IJ SOLN
INTRAMUSCULAR | Status: DC | PRN
  Administered 2023-11-24: 4 mg via INTRAVENOUS

## 2023-11-24 MED ORDER — FENTANYL CITRATE (PF) 100 MCG/2ML IJ SOLN
INTRAMUSCULAR | Status: AC
  Filled 2023-11-24: qty 2

## 2023-11-24 MED ORDER — LIDOCAINE 2% (20 MG/ML) 5 ML SYRINGE
INTRAMUSCULAR | Status: DC | PRN
Start: 2023-11-24 — End: 2023-11-24
  Administered 2023-11-24: 60 mg via INTRAVENOUS

## 2023-11-24 MED ORDER — PANTOPRAZOLE SODIUM 40 MG PO TBEC: 40.0000 mg | DELAYED_RELEASE_TABLET | Freq: Every day | ORAL | Status: DC

## 2023-11-24 MED ORDER — DAPAGLIFLOZIN PROPANEDIOL 10 MG PO TABS
10.0000 mg | ORAL_TABLET | Freq: Every day | ORAL | Status: DC
  Administered 2023-11-25: 10 mg via ORAL
  Filled 2023-11-24 (×2): qty 1

## 2023-11-24 MED ORDER — THROMBIN 20000 UNITS EX SOLR
CUTANEOUS | Status: DC | PRN
  Administered 2023-11-24: 20 mL via TOPICAL

## 2023-11-24 MED ORDER — TORSEMIDE 20 MG PO TABS: 10.0000 mg | ORAL_TABLET | Freq: Two times a day (BID) | ORAL | Status: DC

## 2023-11-24 MED ORDER — BUPIVACAINE LIPOSOME 1.3 % IJ SUSP
INTRAMUSCULAR | Status: AC
  Filled 2023-11-24: qty 20

## 2023-11-24 MED ORDER — AMIODARONE HCL 200 MG PO TABS
200.0000 mg | ORAL_TABLET | Freq: Every day | ORAL | Status: DC
  Administered 2023-11-25: 200 mg via ORAL
  Filled 2023-11-24: qty 1

## 2023-11-24 MED ORDER — METHOCARBAMOL 500 MG PO TABS
500.0000 mg | ORAL_TABLET | Freq: Four times a day (QID) | ORAL | Status: DC
  Administered 2023-11-25 (×2): 500 mg via ORAL
  Filled 2023-11-24 (×3): qty 1

## 2023-11-24 MED ORDER — PHENOL 1.4 % MT LIQD: 1.0000 | OROMUCOSAL | Status: DC | PRN

## 2023-11-24 MED ORDER — 0.9 % SODIUM CHLORIDE (POUR BTL) OPTIME
TOPICAL | Status: DC | PRN
  Administered 2023-11-24 (×2): 1000 mL

## 2023-11-24 MED ORDER — HYDROCODONE-ACETAMINOPHEN 5-325 MG PO TABS
2.0000 | ORAL_TABLET | ORAL | Status: DC | PRN
  Administered 2023-11-25 (×2): 2 via ORAL
  Filled 2023-11-24 (×2): qty 2

## 2023-11-24 MED ORDER — ACETAMINOPHEN 500 MG PO TABS
1000.0000 mg | ORAL_TABLET | Freq: Four times a day (QID) | ORAL | Status: DC
  Administered 2023-11-24 – 2023-11-25 (×3): 1000 mg via ORAL
  Filled 2023-11-24 (×3): qty 2

## 2023-11-24 MED ORDER — THROMBIN 5000 UNITS EX KIT
PACK | CUTANEOUS | Status: AC
  Filled 2023-11-24: qty 1

## 2023-11-24 MED ORDER — PANTOPRAZOLE SODIUM 40 MG PO TBEC
40.0000 mg | DELAYED_RELEASE_TABLET | Freq: Every day | ORAL | Status: DC
  Administered 2023-11-24: 40 mg via ORAL
  Filled 2023-11-24: qty 1

## 2023-11-24 MED ORDER — CHLORHEXIDINE GLUCONATE 4 % EX SOLN: 1.0000 | CUTANEOUS | 1 refills | Status: DC

## 2023-11-24 MED ORDER — PHENYLEPHRINE HCL-NACL 20-0.9 MG/250ML-% IV SOLN
INTRAVENOUS | Status: DC | PRN
  Administered 2023-11-24: 20 ug/min via INTRAVENOUS

## 2023-11-24 MED ORDER — POTASSIUM CHLORIDE CRYS ER 20 MEQ PO TBCR
20.0000 meq | EXTENDED_RELEASE_TABLET | Freq: Every day | ORAL | Status: DC
  Administered 2023-11-24 – 2023-11-25 (×2): 20 meq via ORAL
  Filled 2023-11-24 (×2): qty 1

## 2023-11-24 MED ORDER — EPHEDRINE 5 MG/ML INJ
INTRAVENOUS | Status: AC
  Filled 2023-11-24: qty 5

## 2023-11-24 MED ORDER — ROCURONIUM BROMIDE 10 MG/ML (PF) SYRINGE
PREFILLED_SYRINGE | INTRAVENOUS | Status: AC
  Filled 2023-11-24: qty 10

## 2023-11-24 MED ORDER — PHENYLEPHRINE 80 MCG/ML (10ML) SYRINGE FOR IV PUSH (FOR BLOOD PRESSURE SUPPORT)
PREFILLED_SYRINGE | INTRAVENOUS | Status: AC
Start: 2023-11-24 — End: 2023-11-24
  Filled 2023-11-24: qty 10

## 2023-11-24 MED ORDER — DOXYCYCLINE HYCLATE 100 MG PO TABS
100.0000 mg | ORAL_TABLET | Freq: Two times a day (BID) | ORAL | Status: DC
  Administered 2023-11-24 – 2023-11-25 (×2): 100 mg via ORAL
  Filled 2023-11-24 (×2): qty 1

## 2023-11-24 MED ORDER — FENTANYL CITRATE (PF) 250 MCG/5ML IJ SOLN
INTRAMUSCULAR | Status: AC
  Filled 2023-11-24: qty 5

## 2023-11-24 SURGICAL SUPPLY — 71 items
BAG COUNTER SPONGE SURGICOUNT (BAG) ×4 IMPLANT
BENZOIN TINCTURE PRP APPL 2/3 (GAUZE/BANDAGES/DRESSINGS) ×4 IMPLANT
BIT DRILL NEURO 2X3.1 SFT TUCH (MISCELLANEOUS) IMPLANT
BLADE CLIPPER SURG (BLADE) IMPLANT
BLADE SURG 11 STRL SS (BLADE) ×2 IMPLANT
BNDG ELASTIC 4X5.8 VLCR STR LF (GAUZE/BANDAGES/DRESSINGS) ×2 IMPLANT
BNDG GAUZE DERMACEA FLUFF 4 (GAUZE/BANDAGES/DRESSINGS) ×2 IMPLANT
BUR MATCHSTICK NEURO 3.0 LAGG (BURR) ×2 IMPLANT
BUR PRECISION FLUTE 6.0 (BURR) ×2 IMPLANT
CANISTER SUCTION 3000ML PPV (SUCTIONS) ×4 IMPLANT
CAP LOCKING THREADED (Cap) IMPLANT
CNTNR URN SCR LID CUP LEK RST (MISCELLANEOUS) ×2 IMPLANT
CONNECTOR INLINE 5.5-5.5 (Connector) IMPLANT
COVER BACK TABLE 24X17X13 BIG (DRAPES) IMPLANT
COVER BACK TABLE 60X90IN (DRAPES) ×2 IMPLANT
DERMABOND ADVANCED .7 DNX12 (GAUZE/BANDAGES/DRESSINGS) ×2 IMPLANT
DRAPE C-ARM 42X72 X-RAY (DRAPES) ×4 IMPLANT
DRAPE C-ARMOR (DRAPES) IMPLANT
DRAPE HALF SHEET 40X57 (DRAPES) IMPLANT
DRAPE LAPAROTOMY 100X72X124 (DRAPES) ×2 IMPLANT
DRAPE POUCH INSTRU U-SHP 10X18 (DRAPES) ×2 IMPLANT
DRAPE SURG 17X23 STRL (DRAPES) ×2 IMPLANT
DRSG OPSITE POSTOP 4X10 (GAUZE/BANDAGES/DRESSINGS) IMPLANT
DRSG OPSITE POSTOP 4X6 (GAUZE/BANDAGES/DRESSINGS) IMPLANT
DURAPREP 26ML APPLICATOR (WOUND CARE) ×4 IMPLANT
ELECTRODE REM PT RTRN 9FT ADLT (ELECTROSURGICAL) ×4 IMPLANT
EVACUATOR 1/8 PVC DRAIN (DRAIN) ×2 IMPLANT
GAUZE 4X4 16PLY ~~LOC~~+RFID DBL (SPONGE) IMPLANT
GAUZE SPONGE 4X4 12PLY STRL (GAUZE/BANDAGES/DRESSINGS) ×4 IMPLANT
GLOVE BIO SURGEON STRL SZ7 (GLOVE) IMPLANT
GLOVE BIO SURGEON STRL SZ8 (GLOVE) ×6 IMPLANT
GLOVE BIOGEL PI IND STRL 7.0 (GLOVE) IMPLANT
GLOVE ECLIPSE 7.5 STRL STRAW (GLOVE) IMPLANT
GLOVE INDICATOR 8.5 STRL (GLOVE) ×8 IMPLANT
GOWN STRL REUS W/ TWL LRG LVL3 (GOWN DISPOSABLE) IMPLANT
GOWN STRL REUS W/ TWL XL LVL3 (GOWN DISPOSABLE) ×4 IMPLANT
GOWN STRL REUS W/TWL 2XL LVL3 (GOWN DISPOSABLE) ×2 IMPLANT
GRAFT BN 10X1XDBM MAGNIFUSE (Bone Implant) IMPLANT
GRAFT BNE MATRIX VG FRMBL L 10 (Bone Implant) IMPLANT
KIT BASIN OR (CUSTOM PROCEDURE TRAY) ×4 IMPLANT
KIT TURNOVER KIT B (KITS) ×4 IMPLANT
LOOP VASCLR MAXI BLUE 18IN ST (MISCELLANEOUS) ×2 IMPLANT
NDL HYPO 21X1.5 SAFETY (NEEDLE) ×2 IMPLANT
NDL HYPO 25X1 1.5 SAFETY (NEEDLE) ×4 IMPLANT
NEEDLE HYPO 21X1.5 SAFETY (NEEDLE) ×2 IMPLANT
NEEDLE HYPO 25X1 1.5 SAFETY (NEEDLE) ×4 IMPLANT
PACK LAMINECTOMY NEURO (CUSTOM PROCEDURE TRAY) ×4 IMPLANT
PACK SRG BSC III STRL LF ECLPS (CUSTOM PROCEDURE TRAY) IMPLANT
PAD ARMBOARD POSITIONER FOAM (MISCELLANEOUS) ×8 IMPLANT
RASP 3.0MM (RASP) IMPLANT
ROD 40MM SPINAL (Rod) IMPLANT
ROD CREO 60MM (Rod) IMPLANT
SCREW CORTICAL CREO 5.5-4.5X30 (Screw) IMPLANT
SCREW PA THRD CREO TULIP 5.5X4 (Head) IMPLANT
SOLN 0.9% NACL POUR BTL 1000ML (IV SOLUTION) ×4 IMPLANT
SOLN STERILE WATER BTL 1000 ML (IV SOLUTION) ×4 IMPLANT
SPIKE FLUID TRANSFER (MISCELLANEOUS) ×2 IMPLANT
SPONGE SURGIFOAM ABS GEL 100 (HEMOSTASIS) ×2 IMPLANT
SPONGE T-LAP 4X18 ~~LOC~~+RFID (SPONGE) IMPLANT
STOCKINETTE 4X48 STRL (DRAPES) ×2 IMPLANT
STRIP CLOSURE SKIN 1/2X4 (GAUZE/BANDAGES/DRESSINGS) ×6 IMPLANT
SUT VIC AB 0 CT1 18XCR BRD8 (SUTURE) ×4 IMPLANT
SUT VIC AB 2-0 CP2 18 (SUTURE) ×2 IMPLANT
SUT VIC AB 2-0 CT1 18 (SUTURE) ×2 IMPLANT
SUT VIC AB 3-0 SH 8-18 (SUTURE) ×2 IMPLANT
SUT VIC AB 4-0 PS2 27 (SUTURE) ×2 IMPLANT
SYR 20ML LL LF (SYRINGE) ×2 IMPLANT
TOWEL GREEN STERILE (TOWEL DISPOSABLE) ×4 IMPLANT
TOWEL GREEN STERILE FF (TOWEL DISPOSABLE) ×4 IMPLANT
TRAY FOLEY MTR SLVR 16FR STAT (SET/KITS/TRAYS/PACK) ×2 IMPLANT
UNDERPAD 30X36 HEAVY ABSORB (UNDERPADS AND DIAPERS) ×2 IMPLANT

## 2023-11-24 NOTE — Anesthesia Procedure Notes (Signed)
 Arterial Line Insertion Start/End11/17/2025 9:45 AM, 11/24/2023 9:50 AM Performed by: Claudene Arlin LABOR, CRNA, CRNA  Patient location: Pre-op. Preanesthetic checklist: patient identified, IV checked, site marked, risks and benefits discussed, surgical consent, monitors and equipment checked, pre-op evaluation, timeout performed and anesthesia consent Lidocaine  1% used for infiltration Right, radial was placed Catheter size: 20 G Hand hygiene performed  and maximum sterile barriers used  Allen's test indicative of satisfactory collateral circulation Attempts: 1 Following insertion, dressing applied and Biopatch. Post procedure assessment: normal and unchanged  Patient tolerated the procedure well with no immediate complications.

## 2023-11-24 NOTE — Op Note (Signed)
 Preoperative diagnosis: Back pain with compression deformities at the top of the previous surgical construct at T10 and T9 with persistent back pain and also right sided foot drop from peroneal neuropathy from entrapment at the fibular head.  Postoperative diagnosis: Same.  Procedure: Reexploration of thoracolumbar fusion with placement of bilateral T7-T8 screws and tying into pre-existing construct and posterolateral arthrodesis at T7-8 T8-9 and T9-10.  Placement of cortical screws utilizing the globus modular MCS cortical screw set at T7-T8 and the globus addition set with End and connectors into the pre-existing construct at T10.  3.  Posterolateral arthrodesis T7 AT 8 9 T9-10 utilizing locally harvested autograft mixed with Vivigen and Magnifuse.  4.  Through a separate skin incision decompression of right peroneal nerve at the fibular head.  Surgeon: Arley helling.  Assistant: Suzen Click.  Anesthesia: General.  EBL: Minimal.  HPI: 78 year old gentleman previously undergone a T10 to lumbar fusion following a transpedicular decompression of a pathologic burst fracture and patient fell progressive worsening back pain workup revealed compression deformities at the top of his construct at T10 where the top screws were as well as T9.  In addition EMG nerve conduction showed a right peroneal neuropathy.  Due to the patient's progression of clinical syndrome imaging findings failed conservative treatment I recommended exploration of fusion and extension up to T7 as well as right peroneal decompression.  I extensively over the risks and benefits perioperative course expectations of outcome and alternatives to surgery and he understood and agreed to proceed forward.  Operative procedure: #1 patient was positioned prone on the Wilson frame his back was prepped and draped in routine sterile fashion his old incision was identified and the superior aspect of the incision was opened up and extended  cephalad subperiosteal dissection was carried lamina of T7-T8-T9 and a expose the construct with the top of the rods down to the screws at T10.  Then utilizing AP and lateral fluoroscopy pilot holes were drilled for cortical screw placement I placed bilateral cortical screws at T8 and both screws had excellent purchase under fluoroscopy.  I then placed the right T7 screw but placement of the left T7 screw broke out inferiorly and we did not capture good bone was loose so I removed it.  The teeth 9 cortical screw entry point was to close and adjacent to the rod where I was going to connect to so I elected not to place screws at T9.  I elected to only have 1 screw on the left I did not feel with his history and the fact this was not floridly unstable it was more of a kyphotic deformity around compression fractures that was contributing to back pain that to new screws on the right and the 1 on the left will go to be adequate to extend his fusion up as well as strong posterolateral fusion as long as we get the bone to incorporate.  So I did later and decorticated aggressively the spinal laminar complexes facet joints and TPs and after I connected albuterol  utilizing the end and connector and and rods from T7-T10 I laid extensive mount of autograft material posterior laterally and along the spinal laminar complex at T7-8 T8-9 and T9-10.  I then placed a medium red drain injected Exparel  in the fascia I did have to cut the superior aspect of the left sided rod to contour it and I bent down.  Then closed the wound in layers over a Hemovac injected Exparel  in the fascia and the  skin was closed with a running 4 subcuticular.  At the end the case all needle count sponge counts were correct.  Patient was then repositioned for the peroneal nerve decompression.  Procedure #2: Patient was repositioned in a lateral decubitus with the right side up and right leg exposed and and the posterior lateral aspect of the right knee was  identified fibular head was identified and a S shaped incision was drawn from posterior aspect behind the superior aspect of the fibular head around the undersurface of the fibular head and then extending inferiorly anterior to the fibular head.  And after adequate prepping and draping and infiltration with 6 cc lidocaine  with epi a sharp incision was made and then sequentially with layers divided the subcutaneous tissue until I could identify the fibular head identified the peroneal nerve superior and slightly posterior to the fibular head and fibular neck then followed it underneath the fibular head the nerve was noted to be somewhat macerated from longstanding compression and flattened I then identified the peroneus muscle and divided the external fascia identified both the anterior and posterior septal adhesions divided those to free up the common purulent peroneal nerve as well as freed up the fascial band restricting the nerve at the branch point with deep and superficial.  Dissected out proximally released some fascial adhesions just proximal and superior to the fibular head and again tracked distally and and deep to the posterior aspect of the of the common peroneal nerve and release and fascial adhesions that way as well.  At the end of the decompression I was easily able to pass a nerve hook coronary dilator proximally distally out the common distally out the both the superficial and deep extensions.  The wound was then copiously irrigated meticulous hemostasis was maintained 2-0 Vicryl's were then placed in the subcutaneous tissue and the skin was closed with running 4 subcuticular.  At the end the case all needle count sponge counts were correct.

## 2023-11-24 NOTE — Anesthesia Procedure Notes (Signed)
 Procedure Name: Intubation Date/Time: 11/24/2023 11:54 AM  Performed by: Roslynn Waddell LABOR, CRNAPre-anesthesia Checklist: Patient identified, Emergency Drugs available, Suction available and Patient being monitored Patient Re-evaluated:Patient Re-evaluated prior to induction Oxygen Delivery Method: Circle System Utilized Preoxygenation: Pre-oxygenation with 100% oxygen Induction Type: IV induction Ventilation: Oral airway inserted - appropriate to patient size Laryngoscope Size: Glidescope and 3 Grade View: Grade I Tube type: Oral Tube size: 7.0 mm Number of attempts: 1 Airway Equipment and Method: Stylet and Oral airway Placement Confirmation: ETT inserted through vocal cords under direct vision, positive ETCO2 and breath sounds checked- equal and bilateral Secured at: 23 cm Tube secured with: Tape Dental Injury: Teeth and Oropharynx as per pre-operative assessment  Comments: Small cut to R upper lip. Dentition as per preop. Soft gauze bite block in

## 2023-11-24 NOTE — Transfer of Care (Signed)
 Immediate Anesthesia Transfer of Care Note  Patient: Carlin FORBES Running  Procedure(s) Performed: LAMINECTOMY WITH POSTERIOR LATERAL ARTHRODESIS LEVEL 3 (Back) RIGHT PERONEAL NERVE DECOMPRESSION (Right)  Patient Location: PACU  Anesthesia Type:General  Level of Consciousness: awake and alert   Airway & Oxygen Therapy: Patient Spontanous Breathing and Patient connected to face mask oxygen  Post-op Assessment: Report given to RN and Post -op Vital signs reviewed and stable  Post vital signs: Reviewed and stable  Last Vitals:  Vitals Value Taken Time  BP 138/60 11/24/23 15:21  Temp    Pulse 67 11/24/23 15:23  Resp 15 11/24/23 15:23  SpO2 95 % 11/24/23 15:23  Vitals shown include unfiled device data.  Last Pain:  Vitals:   11/24/23 0835  TempSrc:   PainSc: 5       Patients Stated Pain Goal: 5 (11/24/23 0835)  Complications: No notable events documented.

## 2023-11-24 NOTE — Plan of Care (Signed)

## 2023-11-24 NOTE — H&P (Signed)
 Aaron Richmond is an 78 y.o. male.   Chief Complaint: Back and right foot weakness HPI: 78-year gentleman previously undergone transpedicular decompression from a burst fracture with a thoracic stabilization procedure and was recovering well however had progressive worsening back pain and workup revealed compression deformities at the top and slightly above his fusion.  CT scan did not appear to have hardware all solid and in good position.  However with the compression deformities above it he and his progressive worsening back pain we recommended extending his construct up to T7 extensively and over the risks and benefits of the procedure with him as well as perioperative course expectations of outcome and alternatives to surgery and he understood and agreed to proceed forward.  In addition EMG and nerve conduction test showed peroneal neuropathy from entrapment around the fibular head and so we recommended right peroneal decompression.  We also went over the risks and benefits perioperative course expectations of outcome and alternatives and he understood and agreed to proceed forward.  Past Medical History:  Diagnosis Date   Aneurysm of aortic root    Overview:  Last Assessment & Plan:  Planning to see Dr Army for evaluation of 5cm aneurysm.  - will check PFT in prep for possible procedure / SGY   Aortic atherosclerosis 06/14/2022   Aortic regurgitation    Aortic valve insufficiency    Ascending aortic aneurysm 09/11/2016   Atherosclerotic vascular disease 05/27/2022   Atrial fibrillation (HCC)    Atrial fibrillation with RVR (HCC) 07/05/2022   BMI 27.0-27.9,adult 06/14/2022   BPH (benign prostatic hyperplasia)    BRBPR (bright red blood per rectum) 05/20/2023   Cardiomyopathy (HCC) 09/11/2016   CHF (congestive heart failure) (HCC)    CKD (chronic kidney disease) stage 2, GFR 60-89 ml/min 11/23/2021   Closed fracture of right hip (HCC) 03/10/2023   Coronary artery calcification seen on  CT scan 10/13/2017   Dysrhythmia    A. Fib   Essential hypertension 06/14/2022   Gastrointestinal hemorrhage 05/20/2023   Heart murmur    High risk medication use 07/31/2022   History of cardiomyopathy 11/09/2021   History of pulmonary embolism 09/24/2012   Overview:  Last Assessment & Plan:  Hx PE x 2 per notes, ? 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  - needs auto-titration study by [employer] or local company, data to RB to adjust his home device.    OSA (obstructive sleep apnea) 09/13/2022   Prediabetes    Primary osteoarthritis involving multiple joints 06/14/2022   Prostate cancer metastatic to bone (HCC) 06/14/2022   Pulmonary embolism (HCC)    Rhinitis    Sacral wound 04/11/2023   Seasonal allergies 06/14/2022   Spondylolisthesis of lumbar region 12/12/2021   Stage 3a chronic kidney disease (HCC) 06/14/2022   Unstageable pressure ulcer of sacral region [facility]) 03/10/2023    Past Surgical History:  Procedure Laterality Date   APPENDECTOMY     COLONOSCOPY  N/A 05/23/2023   Procedure: COLONOSCOPY;  Surgeon: Elicia Claw, MD;  Location: [facility];  Service: Gastroenterology;  Laterality: N/A;   INGUINAL HERNIA REPAIR Right    KYPHOPLASTY     LAMINECTOMY WITH POSTERIOR LATERAL ARTHRODESIS LEVEL 4 N/A 09/03/2023   Procedure: Posterior lateral fusion - Thoracic Ten-Thoracic Eleven - Thoracic Eleven -Thoracic  Twelve - Thoracic Twelve-Lumbar One - Lumbar One-Lumbar Two - Lumbar Two-Lumbar Three - Lumbar Three-Lumbar Four, laminectomy and foraminotomy transpedicular decompression Lumbar One;  Surgeon: Onetha Kuba, MD;  Location: [facility] OR;  Service: Neurosurgery;  Laterality: N/A;  Posterior lateral fusion - T10-T11    LUMBAR LAMINECTOMY  02/06/2022   Facetectom & foraminotomy for decompression of the cauda equina & nerve root L4-5, Arley PHEBE Dark, MD   TONSILLECTOMY     TOTAL HIP ARTHROPLASTY Right    TOTAL KNEE ARTHROPLASTY Right     Family History  Problem Relation Age of Onset   Stroke Mother    Alzheimer's disease Mother    Heart attack Mother    Stroke Father    Diabetes Mellitus II Father    Social History:  reports that he has never smoked. He has never used smokeless tobacco. He reports that he does not drink alcohol  and does not use drugs.  Allergies:  Allergies  Allergen Reactions   Cipro [Ciprofloxacin Hcl] Other (See Comments)    Increased risk of rupture  of ascending thoracic aortic aneurysm    Medications Prior to Admission  Medication Sig Dispense Refill   acetaminophen  (TYLENOL ) 500 MG tablet Take 500 mg by mouth 4 (four) times daily.     amiodarone  (PACERONE ) 200 MG tablet Take 1 tablet (200 mg total) by mouth daily. 90 tablet 3   Cyanocobalamin  (B-12) 5000 MCG CAPS Take 1 capsule by mouth daily at 12 noon. (Patient taking differently: Take 5,000 mcg by mouth daily.) 30 capsule 0   dapagliflozin  propanediol (FARXIGA ) 10 MG TABS tablet Take 1 tablet (10 mg total) by mouth daily. 30 tablet 5   doxycycline  (VIBRA -TABS) 100 MG tablet Take 1 tablet (100 mg total) by mouth every 12 (twelve) hours. 6 tablet 0   metoprolol  succinate (TOPROL -XL) 25 MG 24 hr tablet Take 0.5 tablets (12.5 mg total) by mouth 2 (two) times daily. 60 tablet 5   Multiple Vitamins-Minerals (MENS 50+ MULTIVITAMIN) TABS Take 1 tablet by mouth every evening. (Patient taking differently: Take 1 tablet by  mouth daily.)     potassium chloride  SA (KLOR-CON  M20) 20 MEQ tablet Take 1 tablet (20 mEq total) by mouth daily. 30 tablet 5   rosuvastatin  (CRESTOR ) 10 MG tablet Take 1 tablet (10 mg total) by mouth daily. (Patient taking differently: Take 10 mg by mouth every evening.) 30 tablet 5   spironolactone  (ALDACTONE ) 25 MG tablet Take 1 tablet (25 mg total) by mouth daily. 90 tablet 3   tamsulosin  (FLOMAX ) 0.4 MG CAPS capsule TAKE 1 CAPSULE BY MOUTH TWICE DAILY 30  MINUTES  FOLLOWING  THE  SAME  MEAL  EACH  DAY (Patient taking differently: Take 0.4 mg by mouth daily.) 180 capsule 0   torsemide  (DEMADEX ) 10 MG tablet Take 1 tablet (10 mg total) by mouth 2 (two) times daily. (Patient taking differently: Take 10 mg by mouth See admin instructions. Take 1 tablet (10mg ) by mouth twice daily - in the morning and early afternoon (around 1300).) 60 tablet 2   methocarbamol  (ROBAXIN ) 500 MG tablet Take 1 tablet (500 mg total) by mouth 4 (four) times daily. (  Patient not taking: Reported on 11/18/2023) 120 tablet 0   pantoprazole  (PROTONIX ) 40 MG tablet Take 1 tablet (40 mg total) by mouth daily. (Patient not taking: Reported on 11/18/2023) 30 tablet 0   Rivaroxaban  (XARELTO ) 15 MG TABS tablet Take 1 tablet (15 mg total) by mouth daily with supper. (Patient taking differently: Take 15 mg by mouth every evening.) 30 tablet 5   traMADol  (ULTRAM ) 50 MG tablet Take 2 tablets (100 mg total) by mouth every 8 (eight) hours as needed for moderate pain (pain score 4-6). 180 tablet 0    Results for orders placed or performed during the hospital encounter of 11/24/23 (from the past 48 hours)  Basic metabolic panel     Status: Abnormal   Collection Time: 11/24/23  9:10 AM  Result Value Ref Range   Sodium 138 135 - 145 mmol/L   Potassium 4.3 3.5 - 5.1 mmol/L   Chloride 103 98 - 111 mmol/L   CO2 22 22 - 32 mmol/L   Glucose, Bld 102 (H) 70 - 99 mg/dL    Comment: Glucose reference range applies only to samples taken after  fasting for at least 8 hours.   BUN 37 (H) 8 - 23 mg/dL   Creatinine, Ser 8.20 (H) 0.61 - 1.24 mg/dL   Calcium  9.2 8.9 - 10.3 mg/dL   GFR, Estimated 39 (L) >60 mL/min    Comment: (NOTE) Calculated using the CKD-EPI Creatinine Equation (2021)    Anion gap 13 5 - 15    Comment: Performed at [facility], [address] [place], [place] [address]   No results found.  Review of Systems  Musculoskeletal:  Positive for back pain.  Neurological:  Positive for numbness.    Blood pressure (!) 132/45, pulse 60, temperature 98.2 F (36.8 C), temperature source Oral, resp. rate 18, height 5' 10 (1.778 m), weight 74.4 kg, SpO2 98%. Physical Exam HENT:     Head: Normocephalic.     Right Ear: Tympanic membrane normal.     Nose: Nose normal.  Cardiovascular:     Rate and Rhythm: Normal rate.     Pulses: Normal pulses.  Pulmonary:     Effort: Pulmonary effort is normal.  Musculoskeletal:        General: Normal range of motion.     Cervical back: Normal range of motion.  Neurological:     Mental Status: He is alert.     Comments: Right sided foot drop 1-2 out of 5 otherwise he is 5 out of 5 iliopsoas, quads, hamstrings, gastrocs, and anterior tibialis and EHL bilaterally      Assessment/Plan 78 year old presents for extension of his lumbar fusion up to T7 with right peroneal nerve decompression.  Arley SHAUNNA Helling, MD 11/24/2023, 10:35 AM

## 2023-11-25 ENCOUNTER — Encounter (HOSPITAL_COMMUNITY): Payer: Self-pay | Admitting: Neurosurgery

## 2023-11-25 MED ORDER — HYDROCODONE-ACETAMINOPHEN 5-325 MG PO TABS: 2.0000 | ORAL_TABLET | ORAL | 0 refills | Status: DC | PRN

## 2023-11-25 MED FILL — Thrombin For Soln Kit 20000 Unit: CUTANEOUS | Qty: 1 | Status: AC

## 2023-11-25 NOTE — Discharge Instructions (Signed)
 Wound Care Keep incision covered and dry until post op day 3. You may remove the Honeycomb dressing on post op day 3. Leave steri-strips on back.  They will fall off by themselves. Do not put any creams, lotions, or ointments on incision. You are fine to shower. Let water run over incision and pat dry.   Activity Walk each and every day, increasing distance each day. No lifting greater than 8 lbs.  No lifting no bending no twisting no driving . You can ride as a Dealer. If provided with back brace, wear when out of bed.  It is not necessary to wear brace in bed.  Diet Resume your normal diet.  Call Your Doctor If Any of These Occur Redness, drainage, or swelling at the wound.  Temperature greater than 101 degrees. Severe pain not relieved by pain medication. Incision starts to come apart.  Follow Up Appt Call [phone] if you have one or any problem.

## 2023-11-25 NOTE — Discharge Summary (Signed)
 Physician Discharge Summary  Patient ID: Aaron Richmond MRN: [id number] DOB/AGE: May 01, 1945 78 y.o. Estimated body mass index is 23.53 kg/m as calculated from the following:   Height as of this encounter: 5' 10 (1.778 m).   Weight as of this encounter: 74.4 kg.   Admit date: 11/24/2023 Discharge date: 11/25/2023  Admission Diagnoses: Multiple fractures right peroneal neuropathy  Discharge Diagnoses:  Principal Problem:   Compression fracture of body of thoracic vertebra [facility])   Discharged Condition: good  Hospital Course: Patient hospital underwent extension of his fusion posterolateral arthrodesis T7-T10 as well as right peroneal nerve compression.  Postoperatively patient did very well FOR multiple was able to tolerate regular diet stable discharge home.  Follow-up in 2 weeks.  Consults: Significant Diagnostic Studies: Treatments: Thoracic stabilization  Reason Discharge Exam: Blood pressure (!) 114/47, pulse 63, temperature 98 F (36.7 C), resp. rate 18, height 5' 10 (1.778 m), weight 74.4 kg, SpO2 98%. Right-sided foot drop status.  Disposition: Home   Allergies as of 11/25/2023       Reactions   Cipro [ciprofloxacin Hcl] Other (See Comments)   Increased risk of rupture  of ascending thoracic aortic aneurysm        Medication List     STOP taking these medications    B-12 5000 MCG Caps   Mens 50+ Multivitamin Tabs   Rivaroxaban  15 MG Tabs tablet Commonly known as: XARELTO        TAKE these medications    acetaminophen  500 MG tablet Commonly known as: TYLENOL  Take 500 mg by mouth 4 (four) times daily.   amiodarone  200 MG tablet Commonly known as: PACERONE  Take 1 tablet (200 mg total) by mouth daily.   chlorhexidine  4 % external liquid Commonly known as: HIBICLENS  Apply 15 mLs (1 Application total) topically as directed for 30 doses. Use as directed daily for 5 days every other week for 6 weeks.   dapagliflozin  propanediol 10 MG Tabs  tablet Commonly known as: FARXIGA  Take 1 tablet (10 mg total) by mouth daily.   doxycycline  100 MG tablet Commonly known as: VIBRA -TABS Take 1 tablet (100 mg total) by mouth every 12 (twelve) hours.   HYDROcodone -acetaminophen  5-325 MG tablet Commonly known as: NORCO/VICODIN Take 2 tablets by mouth every 4 (four) hours as needed for severe pain (pain score 7-10).   methocarbamol  500 MG tablet Commonly known as: ROBAXIN  Take 1 tablet (500 mg total) by mouth 4 (four) times daily.   metoprolol  succinate 25 MG 24 hr tablet Commonly known as: TOPROL -XL Take 0.5 tablets (12.5 mg total) by mouth 2 (two) times daily.   mupirocin ointment 2 % Commonly known as: BACTROBAN Place 1 Application into the nose 2 (two) times daily for 60 doses. Use as directed 2 times daily for 5 days every other week for 6 weeks.   pantoprazole  40 MG tablet Commonly known as: PROTONIX  Take 1 tablet (40 mg total) by mouth daily.   potassium chloride  SA 20 MEQ tablet Commonly known as: Klor-Con  M20 Take 1 tablet (20 mEq total) by mouth daily.   rosuvastatin  10 MG tablet Commonly known as: CRESTOR  Take 1 tablet (10 mg total) by mouth daily. What changed: when to take this   spironolactone  25 MG tablet Commonly known as: ALDACTONE  Take 1 tablet (25 mg total) by mouth daily.   tamsulosin  0.4 MG Caps capsule Commonly known as: FLOMAX  TAKE 1 CAPSULE BY MOUTH TWICE DAILY 30  MINUTES  FOLLOWING  THE  SAME  MEAL  EACH  DAY What changed: See the new instructions.   torsemide  10 MG tablet Commonly known as: DEMADEX  Take 1 tablet (10 mg total) by mouth 2 (two) times daily. What changed:  when to take this additional instructions   traMADol  50 MG tablet Commonly known as: ULTRAM  Take 2 tablets (100 mg total) by mouth every 8 (eight) hours as needed for moderate pain (pain score 4-6).         Signed: Arley SHAUNNA Helling 11/25/2023, 7:47 AM

## 2023-11-25 NOTE — Evaluation (Signed)
 Occupational Therapy Evaluation Patient Details Name: Aaron Richmond MRN: [id number] DOB: [birth year] Today's Date: 11/25/2023   History of Present Illness   Aaron Richmond is a 78 yo male who is s/p extension of his fusion posterolateral arthrodesis T7-T10 as well as right peroneal nerve compression 11/17. PMHx: a fib, BPH, CHF, CKD, CAD, HLD, HTN, sleep apnea, OA, back sx, sacral wound     Clinical Impressions [name] was evaluated s/p the above admission list. He needs some assist for LB ADLs and used AD to ambulate at baseline. Upon evaluation the pt was limited by back pain, spinal precautions, general debility, unsteady balance and poor activity tolerance. Overall he needed min A for LB ADLs and CGA for transfers and mobility with RW. Pt needed frequent sitting rest breaks for pain management. Pt will benefit from continued acute OT services.      If plan is discharge home, recommend the following:   A little help with walking and/or transfers;A little help with bathing/dressing/bathroom;Assistance with cooking/housework;Assist for transportation;Help with stairs or ramp for entrance     Functional Status Assessment   Patient has had a recent decline in their functional status and demonstrates the ability to make significant improvements in function in a reasonable and predictable amount of time.     Equipment Recommendations   None recommended by OT      Precautions/Restrictions   Precautions Precautions: Fall;Back Precaution Booklet Issued: Yes (comment) Required Braces or Orthoses: Spinal Brace Spinal Brace: Lumbar corset Restrictions Weight Bearing Restrictions Per Provider Order: No     Mobility Bed Mobility Overal bed mobility: Needs Assistance Bed Mobility: Rolling, Sidelying to Sit Rolling: Supervision Sidelying to sit: Supervision       General bed mobility comments: increased time, pt has a bed rail at home    Transfers Overall transfer  level: Needs assistance Equipment used: Rolling walker (2 wheels) Transfers: Sit to/from Stand Sit to Stand: Contact guard assist           General transfer comment: increased time      Balance Overall balance assessment: Needs assistance Sitting-balance support: Feet supported Sitting balance-Leahy Scale: Fair     Standing balance support: Single extremity supported, During functional activity Standing balance-Leahy Scale: Fair Standing balance comment: statically                           ADL either performed or assessed with clinical judgement   ADL Overall ADL's : Needs assistance/impaired Eating/Feeding: Independent   Grooming: Contact guard assist;Standing   Upper Body Bathing: Set up;Sitting   Lower Body Bathing: Minimal assistance;Sit to/from stand   Upper Body Dressing : Set up;Sitting   Lower Body Dressing: Minimal assistance;Sit to/from stand   Toilet Transfer: Contact guard assist;Ambulation;Rolling walker (2 wheels)   Toileting- Clothing Manipulation and Hygiene: Contact guard assist;Sitting/lateral lean       Functional mobility during ADLs: Contact guard assist;Rolling walker (2 wheels) General ADL Comments: pt is likely near his functional baseline, assist needed for LB ADLs due to spinal precautions and pain     Vision Baseline Vision/History: 1 Wears glasses Vision Assessment?: No apparent visual deficits     Perception Perception: Not tested       Praxis Praxis: Not tested       Pertinent Vitals/Pain Pain Assessment Pain Assessment: Faces Faces Pain Scale: Hurts even more Pain Location: back Pain Descriptors / Indicators: Discomfort Pain Intervention(s): Limited activity within patient's tolerance, Monitored during  session     Extremity/Trunk Assessment Upper Extremity Assessment Upper Extremity Assessment: Generalized weakness   Lower Extremity Assessment Lower Extremity Assessment: Defer to PT evaluation    Cervical / Trunk Assessment Cervical / Trunk Assessment: Back Surgery   Communication Communication Communication: Impaired Factors Affecting Communication: Hearing impaired   Cognition Arousal: Alert Behavior During Therapy: WFL for tasks assessed/performed Cognition: No apparent impairments                               Following commands: Intact       Cueing  General Comments   Cueing Techniques: Verbal cues  VSS    Home Living Family/patient expects to be discharged to:: Private residence Living Arrangements: Spouse/significant other Available Help at Discharge: Family;Available 24 hours/day Type of Home: House Home Access: Stairs to enter [employer] of Steps: Step to enter, has step down livingroom with rail Entrance Stairs-Rails: Left;Right Home Layout: One level     Bathroom Shower/Tub: Producer, Television/film/video: Handicapped height Bathroom Accessibility: Yes   Home Equipment: Agricultural Consultant (2 wheels);Cane - single point;Shower Diplomatic Services Operational Officer (4 wheels);Adaptive equipment;Wheelchair - Equities Trader: Reacher        Prior Functioning/Environment Prior Level of Function : Needs assist             Mobility Comments: ambulating with RW ADLs Comments: needs intermittent assist for LB ADLs from his wife    OT Problem List: Decreased strength;Decreased range of motion;Decreased activity tolerance;Decreased safety awareness;Decreased knowledge of use of DME or AE;Decreased knowledge of precautions   OT Treatment/Interventions: Self-care/ADL training;Therapeutic exercise;DME and/or AE instruction;Therapeutic activities;Balance training;Patient/family education      OT Goals(Current goals can be found in the care plan section)   Acute Rehab OT Goals Patient Stated Goal: home OT Goal Formulation: With patient Time For Goal Achievement: 12/09/23 Potential to Achieve Goals: Good ADL  Goals Additional ADL Goal #1: pt will complete all ADLs with mod I   OT Frequency:  Min 2X/week       AM-PAC OT 6 Clicks Daily Activity     Outcome Measure Help from another person eating meals?: None Help from another person taking care of personal grooming?: A Little Help from another person toileting, which includes using toliet, bedpan, or urinal?: A Little Help from another person bathing (including washing, rinsing, drying)?: A Little Help from another person to put on and taking off regular upper body clothing?: A Little Help from another person to put on and taking off regular lower body clothing?: A Little 6 Click Score: 19   End of Session Equipment Utilized During Treatment: Rolling walker (2 wheels) Nurse Communication: Mobility status  Activity Tolerance: Patient tolerated treatment well Patient left: in bed;with call bell/phone within reach  OT Visit Diagnosis: Unsteadiness on feet (R26.81);Other abnormalities of gait and mobility (R26.89);Muscle weakness (generalized) (M62.81)                Time: 9195-9165 OT Time Calculation (min): 30 min Charges:  OT General Charges $OT Visit: 1 Visit OT Evaluation $OT Eval Moderate Complexity: 1 Mod OT Treatments $Self Care/Home Management : 8-22 mins  Lucie Kendall, OTR/L Acute Rehabilitation Services Office [phone] Secure Chat Communication Preferred   Lucie JONETTA Kendall 11/25/2023, 8:48 AM

## 2023-11-25 NOTE — Progress Notes (Signed)
 Patient alert and oriented, mae's well, voiding adequate amount of urine, swallowing without difficulty, no c/o pain at time of discharge. Patient discharged home with family. Script and discharged instructions given to patient. Patient and family stated understanding of instructions given. Patient has an appointment with Dr. Onetha In 2 weeks. Patient waiting for family for ride home

## 2023-11-25 NOTE — Anesthesia Postprocedure Evaluation (Signed)
 Anesthesia Post Note  Patient: Aaron Richmond  Procedure(s) Performed: LAMINECTOMY WITH POSTERIOR LATERAL ARTHRODESIS LEVEL 3 (Back) RIGHT PERONEAL NERVE DECOMPRESSION (Right)     Patient location during evaluation: PACU Anesthesia Type: General Level of consciousness: awake Pain management: pain level controlled Vital Signs Assessment: post-procedure vital signs reviewed and stable Respiratory status: spontaneous breathing, nonlabored ventilation and respiratory function stable Cardiovascular status: blood pressure returned to baseline and stable Postop Assessment: no apparent nausea or vomiting Anesthetic complications: no   No notable events documented.  Last Vitals:  Vitals:   11/24/23 2312 11/25/23 0332  BP: (!) 119/50 (!) 114/47  Pulse: 71 63  Resp: 18 18  Temp: 36.9 C 36.7 C  SpO2: 97% 98%    Last Pain:  Vitals:   11/25/23 0257  TempSrc:   PainSc: Asleep                 Doyle Tegethoff P Sharalee Witman

## 2023-11-25 NOTE — Evaluation (Signed)
 Physical Therapy Evaluation Patient Details Name: Aaron Richmond MRN: [id number] DOB: 1945-07-09 Today's Date: 11/25/2023  History of Present Illness  Aaron Richmond is a 78 yo male who is s/p extension of his fusion posterolateral arthrodesis T7-T10 as well as right peroneal nerve compression 11/17. PMHx: a fib, BPH, CHF, CKD, CAD, HLD, HTN, sleep apnea, OA, back sx, sacral wound   Clinical Impression  Pt admitted for above. Pt particular and set in his ways, resistant to education on transfer techniques however ultimately appreciative of PT services and adjustment of RW to appropriate height. Pt declining HHPT services stating they weren't really effective before when I had them. Pt continues with R drop foot but able to amb safely with use of RW. Acute PT to cont to follow.        If plan is discharge home, recommend the following: A little help with walking and/or transfers;A little help with bathing/dressing/bathroom   Can travel by private vehicle        Equipment Recommendations None recommended by PT  Recommendations for Other Services       Functional Status Assessment       Precautions / Restrictions Precautions Precautions: Fall;Back Precaution Booklet Issued: Yes (comment) Recall of Precautions/Restrictions: Impaired Precaution/Restrictions Comments: pt able to recall prec however doesn't adhere to them functionally Required Braces or Orthoses: Spinal Brace Spinal Brace: Lumbar corset (pt defers wearing brace due to not being comfortable) Restrictions Weight Bearing Restrictions Per Provider Order: No      Mobility  Bed Mobility Overal bed mobility: Needs Assistance Bed Mobility: Rolling, Supine to Sit, Sit to Sidelying Rolling: Supervision   Supine to sit: Contact guard     General bed mobility comments: increased time, pt has a bed rail at home, despite max education and encouragement to complete log roll vs pulling self up into long sit and  helicoptering to EOB, pt resistant to education    Transfers Overall transfer level: Needs assistance Equipment used: Rolling walker (2 wheels) Transfers: Sit to/from Stand Sit to Stand: Min assist           General transfer comment: increased time, pt required 3 trials and minA at middle of power up to complete full upright posture. Pt educated on the need to lower RW to appropriate help to aide in leverage and to minimize the need to raise UEs above head    Ambulation/Gait Ambulation/Gait assistance: Contact guard assist Gait Distance (Feet): 120 Feet Assistive device: Rolling walker (2 wheels) Gait Pattern/deviations: Step-through pattern, Decreased stride length, Decreased dorsiflexion - right Gait velocity: dec Gait velocity interpretation: <1.8 ft/sec, indicate of risk for recurrent falls   General Gait Details: pt with R drop foot, difficulty clearing foot however declined donning AFO for amb. verbal cues to stay in walker to minimize trunk flexion  Stairs            Wheelchair Mobility     Tilt Bed    Modified Rankin (Stroke Patients Only)       Balance Overall balance assessment: Needs assistance Sitting-balance support: Feet supported Sitting balance-Leahy Scale: Fair     Standing balance support: Single extremity supported, During functional activity Standing balance-Leahy Scale: Fair Standing balance comment: statically                             Pertinent Vitals/Pain Pain Assessment Pain Assessment: Faces Faces Pain Scale: Hurts even more Pain Location: back Pain Descriptors /  Indicators: Discomfort Pain Intervention(s): Monitored during session    Home Living Family/patient expects to be discharged to:: Private residence Living Arrangements: Spouse/significant other Available Help at Discharge: Family;Available 24 hours/day Type of Home: House Home Access: Stairs to enter Entrance Stairs-Rails: Tax Inspector of Steps: Step to enter, has step down livingroom with rail   Home Layout: One level Home Equipment: Agricultural Consultant (2 wheels);Cane - single point;Shower Diplomatic Services Operational Officer (4 wheels);Adaptive equipment;Wheelchair - manual Additional Comments: Has raised toilet height and BSC placed over at home    Prior Function Prior Level of Function : Needs assist             Mobility Comments: ambulating with RW ADLs Comments: needs intermittent assist for LB ADLs from his wife     Extremity/Trunk Assessment   Upper Extremity Assessment Upper Extremity Assessment: Generalized weakness    Lower Extremity Assessment Lower Extremity Assessment: Generalized weakness    Cervical / Trunk Assessment Cervical / Trunk Assessment: Back Surgery  Communication   Communication Communication: Impaired Factors Affecting Communication: Hearing impaired    Cognition Arousal: Alert Behavior During Therapy: WFL for tasks assessed/performed   PT - Cognitive impairments: No family/caregiver present to determine baseline                       PT - Cognition Comments: pt easily distracted but can be re-directed, tangential with speech Following commands: Intact       Cueing Cueing Techniques: Verbal cues, Gestural cues, Tactile cues     General Comments General comments (skin integrity, edema, etc.): VSS, ace wrap applied to R knee per MD order/request    Exercises     Assessment/Plan    PT Assessment    PT Problem List         PT Treatment Interventions      PT Goals (Current goals can be found in the Care Plan section)  Acute Rehab PT Goals Patient Stated Goal: home yesterday PT Goal Formulation: With patient Time For Goal Achievement: 12/09/23 Potential to Achieve Goals: Good    Frequency Min 5X/week     Co-evaluation               AM-PAC PT 6 Clicks Mobility  Outcome Measure Help needed turning from your back to your side while in a  flat bed without using bedrails?: A Little Help needed moving from lying on your back to sitting on the side of a flat bed without using bedrails?: A Little Help needed moving to and from a bed to a chair (including a wheelchair)?: A Little Help needed standing up from a chair using your arms (e.g., wheelchair or bedside chair)?: A Little Help needed to walk in hospital room?: A Little Help needed climbing 3-5 steps with a railing? : A Little 6 Click Score: 18    End of Session Equipment Utilized During Treatment: Gait belt Activity Tolerance: Patient tolerated treatment well Patient left: in bed;with call bell/phone within reach Nurse Communication: Mobility status PT Visit Diagnosis: Unsteadiness on feet (R26.81);Muscle weakness (generalized) (M62.81)    Time: 9067-8989 PT Time Calculation (min) (ACUTE ONLY): 38 min   Charges:   PT Evaluation $PT Eval Moderate Complexity: 1 Mod PT Treatments $Gait Training: 8-22 mins $Therapeutic Activity: 8-22 mins PT General Charges $$ ACUTE PT VISIT: 1 Visit         Norene Ames, PT, DPT Acute Rehabilitation Services Secure chat preferred Office #: [phone]   Norene CHRISTELLA Ames  11/25/2023, 10:27 AM

## 2023-11-27 ENCOUNTER — Inpatient Hospital Stay: Admitting: Internal Medicine

## 2023-11-27 ENCOUNTER — Other Ambulatory Visit: Payer: Self-pay

## 2023-11-27 DIAGNOSIS — G4733 Obstructive sleep apnea (adult) (pediatric): Secondary | ICD-10-CM | POA: Diagnosis not present

## 2023-11-27 MED ORDER — DAPAGLIFLOZIN PROPANEDIOL 10 MG PO TABS: 10.0000 mg | ORAL_TABLET | Freq: Every day | ORAL | 5 refills | Status: AC

## 2023-11-28 ENCOUNTER — Other Ambulatory Visit (HOSPITAL_BASED_OUTPATIENT_CLINIC_OR_DEPARTMENT_OTHER): Payer: Self-pay

## 2023-12-08 ENCOUNTER — Other Ambulatory Visit (HOSPITAL_BASED_OUTPATIENT_CLINIC_OR_DEPARTMENT_OTHER): Payer: Self-pay

## 2023-12-08 ENCOUNTER — Encounter (HOSPITAL_COMMUNITY): Admitting: Cardiology

## 2023-12-11 DIAGNOSIS — S22070A Wedge compression fracture of T9-T10 vertebra, initial encounter for closed fracture: Secondary | ICD-10-CM | POA: Diagnosis not present

## 2023-12-11 DIAGNOSIS — G5731 Lesion of lateral popliteal nerve, right lower limb: Secondary | ICD-10-CM | POA: Diagnosis not present

## 2023-12-18 ENCOUNTER — Other Ambulatory Visit (HOSPITAL_BASED_OUTPATIENT_CLINIC_OR_DEPARTMENT_OTHER): Payer: Self-pay | Admitting: Student

## 2023-12-18 DIAGNOSIS — S22070A Wedge compression fracture of T9-T10 vertebra, initial encounter for closed fracture: Secondary | ICD-10-CM

## 2023-12-22 ENCOUNTER — Inpatient Hospital Stay (HOSPITAL_BASED_OUTPATIENT_CLINIC_OR_DEPARTMENT_OTHER): Admission: RE | Admit: 2023-12-22 | Discharge: 2023-12-22 | Attending: Student | Admitting: Radiology

## 2023-12-22 DIAGNOSIS — S22070A Wedge compression fracture of T9-T10 vertebra, initial encounter for closed fracture: Secondary | ICD-10-CM

## 2023-12-23 ENCOUNTER — Telehealth (HOSPITAL_BASED_OUTPATIENT_CLINIC_OR_DEPARTMENT_OTHER): Payer: Self-pay | Admitting: *Deleted

## 2023-12-23 ENCOUNTER — Other Ambulatory Visit: Payer: Self-pay | Admitting: Neurosurgery

## 2023-12-23 NOTE — Telephone Encounter (Signed)
° °  Patient Name: Aaron Richmond  DOB: [birth year] MRN: [id number]  Primary Cardiologist: None  Chart reviewed as part of pre-operative protocol coverage. Pre-op clearance already addressed by colleagues in earlier phone notes. To summarize recommendations:  -He is well-known to me he has a very complex man but I think he is optimized for the planned surgical procedure and we had discussed that during his visit with me He had arrhythmia atrial fibrillation and he is maintaining sinus rhythm with amiodarone  his heart failure is well compensated despite the severity of his valvular heart disease and I would advise proceeding with his planned neurosurgery he will require close attention perioperatively monitored bed the first 24 to 48 hours after surgery and if there is any problems that occur with heart rhythm or heart failure to consult heart care. -Dr. Monetta   No medications indicated as needing held.   Will route this bundled recommendation to requesting provider via Epic fax function and remove from pre-op pool. Please call with questions.  Orren LOISE Fabry, PA-C 12/23/2023, 4:28 PM

## 2023-12-23 NOTE — Telephone Encounter (Signed)
° °  Pre-operative Risk Assessment    Patient Name: Aaron Richmond  DOB: 12-20-1945 MRN: [id number]   Date of last office visit: 11/11/23 DR. MUNLEY Date of next office visit: 01/28/24 DR. MCLEAN AND 02/11/24 DR. MUNLEY   Request for Surgical Clearance    Procedure:  T4-5, T5-6, T6-7 POSTERIOR LATERAL FUSION  Date of Surgery:  Clearance 01/05/24                                Surgeon:  DR. ARLEY HELLING Surgeon's Group or Practice Name:  [facility] Phone number:  [phone] ATTNBETHA ARDS EXT 8244 Fax number:  [phone]   Type of Clearance Requested:   - Medical    Type of Anesthesia:  General    Additional requests/questions:    Bonney Niels Jest   12/23/2023, 3:44 PM  3

## 2023-12-24 ENCOUNTER — Other Ambulatory Visit: Payer: Self-pay | Admitting: Internal Medicine

## 2023-12-25 NOTE — Pre-Procedure Instructions (Signed)
 Surgical Instructions   Your procedure is scheduled on January 05, 2024. Report to [facility] Main Entrance A at 8:45 A.M., then check in with the Admitting office. Any questions or running late day of surgery: call [phone]  Questions prior to your surgery date: call [phone], Monday-Friday, 8am-4pm. If you experience any cold or flu symptoms such as cough, fever, chills, shortness of breath, etc. between now and your scheduled surgery, please notify us  at the above number.     Remember:  Do not eat or drink after midnight the night before your surgery    Take these medicines the morning of surgery with A SIP OF WATER: acetaminophen  (TYLENOL )  amiodarone  (PACERONE )  dapagliflozin  propanediol (FARXIGA )  doxycycline  (VIBRA -TABS)  metoprolol  succinate (TOPROL -XL)  tamsulosin  (FLOMAX )    May take these medicines IF NEEDED: HYDROcodone -acetaminophen  (NORCO/VICODIN)  traMADol  (ULTRAM )    One week prior to surgery, STOP taking any Aspirin (unless otherwise instructed by your surgeon) Aleve, Naproxen, Ibuprofen, Motrin, Advil, Goody's, BC's, all herbal medications, fish oil, and non-prescription vitamins.                     Do NOT Smoke (Tobacco/Vaping) for 24 hours prior to your procedure.  If you use a CPAP at night, you may bring your mask/headgear for your overnight stay.   You will be asked to remove any contacts, glasses, piercing's, hearing aid's, dentures/partials prior to surgery. Please bring cases for these items if needed.    Patients discharged the day of surgery will not be allowed to drive home, and someone needs to stay with them for 24 hours.  SURGICAL WAITING ROOM VISITATION Patients may have no more than 2 support people in the waiting area - these visitors may rotate.   Pre-op nurse will coordinate an appropriate time for 1 ADULT support person, who may not rotate, to accompany patient in pre-op.  Children under the age of 66 must have an adult with  them who is not the patient and must remain in the main waiting area with an adult.  If the patient needs to stay at the hospital during part of their recovery, the visitor guidelines for inpatient rooms apply.  Please refer to the [facility] website for the visitor guidelines for any additional information.   If you received a COVID test during your pre-op visit  it is requested that you wear a mask when out in public, stay away from anyone that may not be feeling well and notify your surgeon if you develop symptoms. If you have been in contact with anyone that has tested positive in the last 10 days please notify you surgeon.      Pre-operative 4 CHG Bathing Instructions   You can play a key role in reducing the risk of infection after surgery. Your skin needs to be as free of germs as possible. You can reduce the number of germs on your skin by washing with CHG (chlorhexidine  gluconate) soap before surgery. CHG is an antiseptic soap that kills germs and continues to kill germs even after washing.   DO NOT use if you have an allergy to chlorhexidine /CHG or antibacterial soaps. If your skin becomes reddened or irritated, stop using the CHG and notify one of our RNs at [phone].   Please shower with the CHG soap starting 4 days before surgery using the following schedule:     Please keep in mind the following:  DO NOT shave, including legs and underarms, starting the  day of your first shower.   You may shave your face at any point before/day of surgery.  Place clean sheets on your bed the day you start using CHG soap. Use a clean washcloth (not used since being washed) for each shower. DO NOT sleep with pets once you start using the CHG.   CHG Shower Instructions:  Wash your face and private area with normal soap. If you choose to wash your hair, wash first with your normal shampoo.  After you use shampoo/soap, rinse your hair and body thoroughly to remove shampoo/soap residue.   Turn the water OFF and apply  bottle of CHG soap to a CLEAN washcloth.  Apply CHG soap ONLY FROM YOUR NECK DOWN TO YOUR TOES (washing for 3-5 minutes)  DO NOT use CHG soap on face, private areas, open wounds, or sores.  Pay special attention to the area where your surgery is being performed.  If you are having back surgery, having someone wash your back for you may be helpful. Wait 2 minutes after CHG soap is applied, then you may rinse off the CHG soap.  Pat dry with a clean towel  Put on clean clothes/pajamas   If you choose to wear lotion, please use ONLY the CHG-compatible lotions that are listed below.  Additional instructions for the day of surgery:  If you choose, you may shower the morning of surgery with an antibacterial soap.  DO NOT APPLY any lotions, deodorants, cologne, or perfumes.   Do not bring valuables to the hospital. [facility] is not responsible for any belongings/valuables. Do not wear nail polish, gel polish, artificial nails, or any other type of covering on natural nails (fingers and toes) Do not wear jewelry or makeup Put on clean/comfortable clothes.  Please brush your teeth.  Ask your nurse before applying any prescription medications to the skin.     CHG Compatible Lotions   Aveeno Moisturizing lotion  Cetaphil Moisturizing Cream  Cetaphil Moisturizing Lotion  Clairol Herbal Essence Moisturizing Lotion, Dry Skin  Clairol Herbal Essence Moisturizing Lotion, Extra Dry Skin  Clairol Herbal Essence Moisturizing Lotion, Normal Skin  Curel Age Defying Therapeutic Moisturizing Lotion with Alpha Hydroxy  Curel Extreme Care Body Lotion  Curel Soothing Hands Moisturizing Hand Lotion  Curel Therapeutic Moisturizing Cream, Fragrance-Free  Curel Therapeutic Moisturizing Lotion, Fragrance-Free  Curel Therapeutic Moisturizing Lotion, Original Formula  Eucerin Daily Replenishing Lotion  Eucerin Dry Skin Therapy Plus Alpha Hydroxy Crme  Eucerin Dry Skin  Therapy Plus Alpha Hydroxy Lotion  Eucerin Original Crme  Eucerin Original Lotion  Eucerin Plus Crme Eucerin Plus Lotion  Eucerin TriLipid Replenishing Lotion  Keri Anti-Bacterial Hand Lotion  Keri Deep Conditioning Original Lotion Dry Skin Formula Softly Scented  Keri Deep Conditioning Original Lotion, Fragrance Free Sensitive Skin Formula  Keri Lotion Fast Absorbing Fragrance Free Sensitive Skin Formula  Keri Lotion Fast Absorbing Softly Scented Dry Skin Formula  Keri Original Lotion  Keri Skin Renewal Lotion Keri Silky Smooth Lotion  Keri Silky Smooth Sensitive Skin Lotion  Nivea Body Creamy Conditioning Oil  Nivea Body Extra Enriched Lotion  Nivea Body Original Lotion  Nivea Body Sheer Moisturizing Lotion Nivea Crme  Nivea Skin Firming Lotion  NutraDerm 30 Skin Lotion  NutraDerm Skin Lotion  NutraDerm Therapeutic Skin Cream  NutraDerm Therapeutic Skin Lotion  ProShield Protective Hand Cream  Provon moisturizing lotion  Please read over the following fact sheets that you were given.

## 2023-12-26 ENCOUNTER — Encounter (HOSPITAL_COMMUNITY)
Admission: RE | Admit: 2023-12-26 | Discharge: 2023-12-26 | Disposition: A | Discharge status: Home / Self Care | Source: Ambulatory Visit | Attending: Neurosurgery | Admitting: Neurosurgery

## 2023-12-26 ENCOUNTER — Encounter (HOSPITAL_COMMUNITY): Payer: Self-pay

## 2023-12-26 ENCOUNTER — Other Ambulatory Visit: Payer: Self-pay

## 2023-12-26 VITALS — BP 121/47 | HR 70 | Temp 98.1°F | Resp 18 | Ht 70.0 in | Wt 166.8 lb

## 2023-12-26 DIAGNOSIS — I700 Atherosclerosis of aorta: Secondary | ICD-10-CM | POA: Diagnosis not present

## 2023-12-26 DIAGNOSIS — I428 Other cardiomyopathies: Secondary | ICD-10-CM | POA: Insufficient documentation

## 2023-12-26 DIAGNOSIS — I130 Hypertensive heart and chronic kidney disease with heart failure and stage 1 through stage 4 chronic kidney disease, or unspecified chronic kidney disease: Secondary | ICD-10-CM | POA: Insufficient documentation

## 2023-12-26 DIAGNOSIS — I2510 Atherosclerotic heart disease of native coronary artery without angina pectoris: Secondary | ICD-10-CM | POA: Diagnosis not present

## 2023-12-26 DIAGNOSIS — I7121 Aneurysm of the ascending aorta, without rupture: Secondary | ICD-10-CM | POA: Insufficient documentation

## 2023-12-26 DIAGNOSIS — Z8546 Personal history of malignant neoplasm of prostate: Secondary | ICD-10-CM | POA: Diagnosis not present

## 2023-12-26 DIAGNOSIS — I4891 Unspecified atrial fibrillation: Secondary | ICD-10-CM | POA: Diagnosis not present

## 2023-12-26 DIAGNOSIS — Z01812 Encounter for preprocedural laboratory examination: Secondary | ICD-10-CM | POA: Diagnosis present

## 2023-12-26 DIAGNOSIS — M4854XA Collapsed vertebra, not elsewhere classified, thoracic region, initial encounter for fracture: Secondary | ICD-10-CM | POA: Insufficient documentation

## 2023-12-26 DIAGNOSIS — Z7901 Long term (current) use of anticoagulants: Secondary | ICD-10-CM | POA: Diagnosis not present

## 2023-12-26 DIAGNOSIS — I509 Heart failure, unspecified: Secondary | ICD-10-CM | POA: Diagnosis not present

## 2023-12-26 DIAGNOSIS — N1831 Chronic kidney disease, stage 3a: Secondary | ICD-10-CM | POA: Insufficient documentation

## 2023-12-26 DIAGNOSIS — G4733 Obstructive sleep apnea (adult) (pediatric): Secondary | ICD-10-CM | POA: Diagnosis not present

## 2023-12-26 DIAGNOSIS — I080 Rheumatic disorders of both mitral and aortic valves: Secondary | ICD-10-CM | POA: Insufficient documentation

## 2023-12-26 DIAGNOSIS — I493 Ventricular premature depolarization: Secondary | ICD-10-CM | POA: Insufficient documentation

## 2023-12-26 DIAGNOSIS — E7800 Pure hypercholesterolemia, unspecified: Secondary | ICD-10-CM | POA: Diagnosis not present

## 2023-12-26 DIAGNOSIS — Z01818 Encounter for other preprocedural examination: Secondary | ICD-10-CM

## 2023-12-26 LAB — SURGICAL PCR SCREEN
MRSA, PCR: POSITIVE — AB
Staphylococcus aureus: POSITIVE — AB

## 2023-12-26 LAB — TYPE AND SCREEN
ABO/RH(D): O POS
Antibody Screen: NEGATIVE

## 2023-12-26 LAB — BASIC METABOLIC PANEL WITH GFR
Anion gap: 11 (ref 5–15)
BUN: 22 mg/dL (ref 8–23)
CO2: 22 mmol/L (ref 22–32)
Calcium: 9.1 mg/dL (ref 8.9–10.3)
Chloride: 105 mmol/L (ref 98–111)
Creatinine, Ser: 1.24 mg/dL (ref 0.61–1.24)
GFR, Estimated: 60 mL/min — ABNORMAL LOW
Glucose, Bld: 130 mg/dL — ABNORMAL HIGH (ref 70–99)
Potassium: 4.2 mmol/L (ref 3.5–5.1)
Sodium: 139 mmol/L (ref 135–145)

## 2023-12-26 LAB — CBC
HCT: 35.9 % — ABNORMAL LOW (ref 39.0–52.0)
Hemoglobin: 10.9 g/dL — ABNORMAL LOW (ref 13.0–17.0)
MCH: 28.8 pg (ref 26.0–34.0)
MCHC: 30.4 g/dL (ref 30.0–36.0)
MCV: 94.7 fL (ref 80.0–100.0)
Platelets: 264 K/uL (ref 150–400)
RBC: 3.79 MIL/uL — ABNORMAL LOW (ref 4.22–5.81)
RDW: 16 % — ABNORMAL HIGH (ref 11.5–15.5)
WBC: 8.6 K/uL (ref 4.0–10.5)
nRBC: 0 % (ref 0.0–0.2)

## 2023-12-26 NOTE — Pre-Procedure Instructions (Signed)
 Surgical Instructions   Your procedure is scheduled on January 05, 2024. Report to [facility] Main Entrance A at 8:45 A.M., then check in with the Admitting office. Any questions or running late day of surgery: call [phone]  Questions prior to your surgery date: call [phone], Monday-Friday, 8am-4pm. If you experience any cold or flu symptoms such as cough, fever, chills, shortness of breath, etc. between now and your scheduled surgery, please notify us  at the above number.     Remember:  Do not eat or drink after midnight the night before your surgery    Take these medicines the morning of surgery with A SIP OF WATER: acetaminophen  (TYLENOL )  amiodarone  (PACERONE )  dapagliflozin  propanediol (FARXIGA )  metoprolol  succinate (TOPROL -XL)  tamsulosin  (FLOMAX )    May take these medicines IF NEEDED: traMADol  (ULTRAM )  methocarbamol  (ROBAXIN )   Follow your surgeon's instructions on when to stop Xarelto .  If no instructions were given by your surgeon then you will need to call the office to get those instructions.    One week prior to surgery, STOP taking any Aleve, Naproxen, Ibuprofen, Motrin, Advil, Goody's, BC's, all herbal medications, fish oil, and non-prescription vitamins.                     Do NOT Smoke (Tobacco/Vaping) for 24 hours prior to your procedure.  If you use a CPAP at night, you may bring your mask/headgear for your overnight stay.   You will be asked to remove any contacts, glasses, piercing's, hearing aid's, dentures/partials prior to surgery. Please bring cases for these items if needed.    Patients discharged the day of surgery will not be allowed to drive home, and someone needs to stay with them for 24 hours.  SURGICAL WAITING ROOM VISITATION Patients may have no more than 2 support people in the waiting area - these visitors may rotate.   Pre-op nurse will coordinate an appropriate time for 1 ADULT support person, who may not rotate, to  accompany patient in pre-op.  Children under the age of 26 must have an adult with them who is not the patient and must remain in the main waiting area with an adult.  If the patient needs to stay at the hospital during part of their recovery, the visitor guidelines for inpatient rooms apply.  Please refer to the [facility] website for the visitor guidelines for any additional information.   If you received a COVID test during your pre-op visit  it is requested that you wear a mask when out in public, stay away from anyone that may not be feeling well and notify your surgeon if you develop symptoms. If you have been in contact with anyone that has tested positive in the last 10 days please notify you surgeon.      Pre-operative 4 CHG Bathing Instructions   You can play a key role in reducing the risk of infection after surgery. Your skin needs to be as free of germs as possible. You can reduce the number of germs on your skin by washing with CHG (chlorhexidine  gluconate) soap before surgery. CHG is an antiseptic soap that kills germs and continues to kill germs even after washing.   DO NOT use if you have an allergy to chlorhexidine /CHG or antibacterial soaps. If your skin becomes reddened or irritated, stop using the CHG and notify one of our RNs at [phone].   Please shower with the CHG soap starting 4 days before surgery using the  following schedule:     Please keep in mind the following:  DO NOT shave, including legs and underarms, starting the day of your first shower.   You may shave your face at any point before/day of surgery.  Place clean sheets on your bed the day you start using CHG soap. Use a clean washcloth (not used since being washed) for each shower. DO NOT sleep with pets once you start using the CHG.   CHG Shower Instructions:  Wash your face and private area with normal soap. If you choose to wash your hair, wash first with your normal shampoo.  After you use  shampoo/soap, rinse your hair and body thoroughly to remove shampoo/soap residue.  Turn the water OFF and apply  bottle of CHG soap to a CLEAN washcloth.  Apply CHG soap ONLY FROM YOUR NECK DOWN TO YOUR TOES (washing for 3-5 minutes)  DO NOT use CHG soap on face, private areas, open wounds, or sores.  Pay special attention to the area where your surgery is being performed.  If you are having back surgery, having someone wash your back for you may be helpful. Wait 2 minutes after CHG soap is applied, then you may rinse off the CHG soap.  Pat dry with a clean towel  Put on clean clothes/pajamas   If you choose to wear lotion, please use ONLY the CHG-compatible lotions that are listed below.  Additional instructions for the day of surgery:  If you choose, you may shower the morning of surgery with an antibacterial soap.  DO NOT APPLY any lotions, deodorants, cologne, or perfumes.   Do not bring valuables to the hospital. [facility] is not responsible for any belongings/valuables. Do not wear nail polish, gel polish, artificial nails, or any other type of covering on natural nails (fingers and toes) Do not wear jewelry or makeup Put on clean/comfortable clothes.  Please brush your teeth.  Ask your nurse before applying any prescription medications to the skin.     CHG Compatible Lotions   Aveeno Moisturizing lotion  Cetaphil Moisturizing Cream  Cetaphil Moisturizing Lotion  Clairol Herbal Essence Moisturizing Lotion, Dry Skin  Clairol Herbal Essence Moisturizing Lotion, Extra Dry Skin  Clairol Herbal Essence Moisturizing Lotion, Normal Skin  Curel Age Defying Therapeutic Moisturizing Lotion with Alpha Hydroxy  Curel Extreme Care Body Lotion  Curel Soothing Hands Moisturizing Hand Lotion  Curel Therapeutic Moisturizing Cream, Fragrance-Free  Curel Therapeutic Moisturizing Lotion, Fragrance-Free  Curel Therapeutic Moisturizing Lotion, Original Formula  Eucerin Daily Replenishing  Lotion  Eucerin Dry Skin Therapy Plus Alpha Hydroxy Crme  Eucerin Dry Skin Therapy Plus Alpha Hydroxy Lotion  Eucerin Original Crme  Eucerin Original Lotion  Eucerin Plus Crme Eucerin Plus Lotion  Eucerin TriLipid Replenishing Lotion  Keri Anti-Bacterial Hand Lotion  Keri Deep Conditioning Original Lotion Dry Skin Formula Softly Scented  Keri Deep Conditioning Original Lotion, Fragrance Free Sensitive Skin Formula  Keri Lotion Fast Absorbing Fragrance Free Sensitive Skin Formula  Keri Lotion Fast Absorbing Softly Scented Dry Skin Formula  Keri Original Lotion  Keri Skin Renewal Lotion Keri Silky Smooth Lotion  Keri Silky Smooth Sensitive Skin Lotion  Nivea Body Creamy Conditioning Oil  Nivea Body Extra Enriched Teacher, Adult Education Moisturizing Lotion Nivea Crme  Nivea Skin Firming Lotion  NutraDerm 30 Skin Lotion  NutraDerm Skin Lotion  NutraDerm Therapeutic Skin Cream  NutraDerm Therapeutic Skin Lotion  ProShield Protective Hand Cream  Provon moisturizing lotion  Please read  over the following fact sheets that you were given.

## 2023-12-26 NOTE — Progress Notes (Signed)
 PCP - Dr. Selinda Salinas Eyk- pt will start seeing Dr. Caleen at next visit Cardiologist - Dr. Redell Leiter  PPM/ICD - denies   Chest x-ray - 10/08/17 EKG - 11/11/23 Stress Test - 09/11/23 ECHO - 09/05/23 Cardiac Cath - denies  Sleep Study - OSA+ CPAP - pt unsure of pressure settings  DM- denies  Last dose of GLP1 agonist-  n/a   Blood Thinner Instructions: Hold Xarelto  5 days. Last dose 12/23 Aspirin Instructions: n/a  ERAS Protcol - no, NPO   COVID TEST- n/a   Anesthesia review: yes, cardiac hx  Patient denies shortness of breath, fever, cough and chest pain at PAT appointment   All instructions explained to the patient, with a verbal understanding of the material. Patient agrees to go over the instructions while at home for a better understanding.  The opportunity to ask questions was provided.

## 2023-12-29 NOTE — Anesthesia Preprocedure Evaluation (Addendum)
"                                    Anesthesia Evaluation  Patient identified by MRN, date of birth, ID band Patient awake    Reviewed: Allergy & Precautions, NPO status , Patient's Chart, lab work & pertinent test results, reviewed documented beta blocker date and time   History of Anesthesia Complications Negative for: history of anesthetic complications  Airway Mallampati: III  TM Distance: >3 FB Neck ROM: Full    Dental  (+) Dental Advisory Given   Pulmonary sleep apnea and Continuous Positive Airway Pressure Ventilation , PE   Pulmonary exam normal        Cardiovascular hypertension, Pt. on home beta blockers and Pt. on medications + CAD and +CHF  Normal cardiovascular exam+ dysrhythmias Atrial Fibrillation + Valvular Problems/Murmurs AI    '25 TTE - EF 30 to 35%. The left ventricle demonstrates global hypokinesis. Right ventricular systolic function is mildly reduced. Left atrial size was moderately dilated. Right atrial size was mildly dilated. Mild mitral valve regurgitation. Aortic valve regurgitation is moderate. There is severe dilatation of the ascending aorta, measuring 56 mm. Rhythm strip during this exam demonstrates premature ventricular contractions and atrial fibrillation.     Neuro/Psych negative neurological ROS  negative psych ROS   GI/Hepatic negative GI ROS, Neg liver ROS,,,  Endo/Other  negative endocrine ROS    Renal/GU CRFRenal disease    Prostate cancer     Musculoskeletal  (+) Arthritis ,    Abdominal   Peds  Hematology  (+) Blood dyscrasia, anemia  On xarelto     Anesthesia Other Findings   Reproductive/Obstetrics                              Anesthesia Physical Anesthesia Plan  ASA: 4  Anesthesia Plan: General   Post-op Pain Management: Tylenol  PO (pre-op)*   Induction: Intravenous  PONV Risk Score and Plan: 2 and Treatment may vary due to age or medical condition, Ondansetron  and  Propofol  infusion  Airway Management Planned: Oral ETT  Additional Equipment: Arterial line  Intra-op Plan:   Post-operative Plan: Extubation in OR  Informed Consent: I have reviewed the patients History and Physical, chart, labs and discussed the procedure including the risks, benefits and alternatives for the proposed anesthesia with the patient or authorized representative who has indicated his/her understanding and acceptance.     Dental advisory given  Plan Discussed with: CRNA and Anesthesiologist  Anesthesia Plan Comments: (PAT note written 12/29/2023 by Allison Zelenak, PA-C.)         Anesthesia Quick Evaluation  "

## 2023-12-29 NOTE — Progress Notes (Signed)
 Anesthesia Chart Review:  Case: [id number] Date/Time: 01/05/24 1037   Procedure: LAMINECTOMY WITH POSTERIOR LATERAL ARTHRODESIS LEVEL 3 (Back) - Extension of posterior lateral fusion with instrumentation - T4-T5 - T5-T6 - T6-T7   Anesthesia type: General   Diagnosis: Compression fracture of T10 vertebra, initial encounter (HCC) [S22.070A]   Pre-op diagnosis: Compression fracture of T10 vertebra   [facility]: [facility] ROOM 21 / [facility]   Surgeons: Onetha Kuba, MD       DISCUSSION: Patient is a 78 year old male scheduled for the above procedure. He is s/p re-exploration of thoracolumbar fusion with placement of T7-8 screws, posterolateral arthrodesis T7-10 on 11/24/2023.  No apparent anesthesia complications.  Discharged on POD #1.    History includes never smoker, HTN, hypercholesterolemia, afib, CAD (moderate CAD with no significant stenosis by ctFFR 07/17/2023), cardiomyopathy (NICM, possibly tachy-mediated vs valvular disease), murmur (mild MR, moderate AR by 09/05/2023 TTE, severe AI 09/11/2023 cMRI), aortic root/ascending thoracic aortic aneurysm (5.5 cm aortic root, 5.4 cm TAA 07/24/2023 CTA), PE (2014), prostate cancer, BPH, CKD, GI bleed, OSA (uses BiPAP, but current issues wit mask fit), spinal surgery (L1 transpedicular decompression, T10-L4 posterolateral arthrodesis/pedical screw fixation 09/03/2023, complicated by peri-operative unstable SVT s/p cardioversion, see below; s/p exploration of thoracolumbar fusion, posterolateral arthrodesis T7-10 11/24/2023).    He is s/p T10-L4 decompression fusion on 09/03/2023. He became hypotensive intraoperatively (took ARB that morning) and required pressors. At end of case he developed unstable SVT. Cardioversion x2 at 200J was unsuccessful, and EP consulted. He was given adenosine 6 mg then 12 mg without change in rhythm. He was started on amiodarone  and [place] retrieved from cath lab and converted to afib with RVR with one 360J shock. Pressures rapidly improved. He  was transferred to the ICU for weaning of pressors, but some difficulty weaning due to trouble maintaining MAP of 65 with wide pulse pressure with diastolic in the 40's (known AI). HF cardiology consulted. TTE and cMRI ordered. 09/05/2023 TTE was overall stable from prior showing EF 30-35%, mild RV dysfunction, ascending aorta 56 mm with moderate AI. He was weaned off norepinephrine  by 09/06/2023 with aim for SBP > 100 rather than using MAP. He was discharged to CIR for rehab 09/10/2023 - 09/24/2023. While there he had a cMRI on 09/11/2023 that showed LVEF 40%, RVEF 37%, severe AI, subendocardial LGE in basal inferolateral wall consistent with small prior MI. He converted to SR while on amiodarone . He was discharged on doxycyline for mild surgical site erythema and superficial dehiscence. He had post-hospitalization follow-up with neurosurgery, primary care, primary cardiology, HF cardiology, and CT surgery follow-up.      Last out-patient pulmonology visit noted is from 08/13/2023 with Hope Norris for preoperative evaluation prior to his previously back surgery. She noted, He has severe sleep apnea and uses a BiPAP machine with settings of 19/15. Despite compliance, he experiences approximately twenty-six apneic events per hour. A titration study from October of the previous year showed severe sleep apnea with fifty-nine apneas per hour. Obstructive apneas were controlled on CPAP of fifteen, but central apneas led to the switch to BiPAP. He has tried different masks, but all have issues with air leaks.    He was evaluated at the HF clinic on 10/15/2023 by Colletta Shaver, PA-C. Volume status was okay. He was unable to tolerate Entresto  due to orthostasis. Continued on low dose Toprol  12.5 mg BID, torsemide  20 mg daily, spironolactone  25 mg daily, Farxiga  10 mg daily, amiodarone  200 mg daily, rosuvastatin  10  mg daily, Xarelto  15 mg daily  He had primary cardiology follow-up with Dr. Katrina on 11/11/2023. Most recent  cardiac testing as previously outlined. Also he had a CCTA on 07/17/2023 that showed CAC 1959 (88th percentile) with 25-49% mid LAD and 50-69% D1 with ctFFR showing no significant stenosis. At that visit, patient was felt to be doing well from a cardiac standpoint and commented on plans for back surgery--which was done in November.  Given he is now scheduled for another surgery, Dr. Onetha reached back on to cardiology for CV risk assessment. Dr. Leandrew input was was outlined by Lucien Blanc, PA-C on 12/23/2023: He is well-known to me he has a very complex man but I think he is optimized for the planned surgical procedure and we had discussed that during his visit with me He had arrhythmia atrial fibrillation and he is maintaining sinus rhythm with amiodarone  his heart failure is well compensated despite the severity of his valvular heart disease and I would advise proceeding with his planned neurosurgery he will require close attention perioperatively monitored bed the first 24 to 48 hours after surgery and if there is any problems that occur with heart rhythm or heart failure to consult heart care.    He was evaluated by Dr. Kerrin with CT surgery on 10/31/2023 for aortic root aneurysm/TAA. He noted events during recent admission. By 07/2023 CTA Chest, the ascending TAA measured ~ 5.4 to 5.5 cm at the sinuses of Valsalva, mid ascending aorta 4.8 cm, unchanged from 2021. Dr. Kerrin wrote, Aortic root aneurysm with severe aortic insufficiency-symptoms well-controlled currently.  I had a long discussion with Mr. Jeanbaptiste and his wife.  They understand that over time aortic insufficiency can cause permanent heart failure.  Unclear at present how much of his cardiomyopathy is due to his valvular insufficiency and how much might be due to atrial fibrillation.   They were told previously by one of my partners that he is not a candidate for aortic root replacement.  While I would not completely rule out  that possibility I do not think he is a candidate at the present time.  He is unable to sit still due to severe back pain and is essentially wheelchair-bound at this point.  He has been told that he has a new spinal fracture that needs surgical correction.   Obviously, would be high risk for perioperative cardiac complications with spine surgery given his recent history.  Very difficult situation either way.  He very strongly wishes to go ahead with his spine surgery and address the cardiac issues at a later time should we decide to do so. Plan for him to follow-up with Dr. Onetha and then with CT surgery in 3 months with a CT of the chest.      He reported last Xarelto  12/30/2023.   Creatinine has improved since November (as 1.90->1.79). As of 12/26/2023, Cr 1.24, eGFR 60, BUN 22. H/H 10.9/35.9, down from 12/16/39.1 which was prior to his last lumber fusion on 11/24/2023.    Anesthesia team to evaluate on the day of surgery.    VS: BP (!) 121/47   Pulse 70   Temp 36.7 C   Resp 18   Ht 5' 10 (1.778 m)   Wt 75.7 kg   SpO2 98%   BMI 23.93 kg/m    PROVIDERS: Fleeta Valeria Mayo, MD  is PCP with plans to get established with Dr. Caleen in late December 2025. Monetta Rogue, MD is cardiologist Rolan Barrack, MD  is HF cardiologist Inocencio Chi, MD is EP cardiologist (saw in-patient in 08/2023) Kerrin Standing, MD is CT surgeon Hope Norris, NP is pulmonology provider ([facility])     LABS: Preoperative labs noted. See DISCUSSION. (all labs ordered are listed, but only abnormal results are displayed)  Labs Reviewed  SURGICAL PCR SCREEN - Abnormal; Notable for the following components:      Result Value   MRSA, PCR POSITIVE (*)    Staphylococcus aureus POSITIVE (*)    All other components within normal limits  BASIC METABOLIC PANEL WITH GFR - Abnormal; Notable for the following components:   Glucose, Bld 130 (*)    GFR, Estimated 60 (*)    All other components within normal limits   CBC - Abnormal; Notable for the following components:   RBC 3.79 (*)    Hemoglobin 10.9 (*)    HCT 35.9 (*)    RDW 16.0 (*)    All other components within normal limits  TYPE AND SCREEN     Split Night Sleep Study with BiPAP 10/22/2022: IMPRESSIONS - Severe obstructive sleep apnea occurred during the diagnostic portion of the study (AHI = 59.8 /hour). - Obstructive apneas were controlled by CPAP 15, however persistent central apneas led change to BIPAP titration. - An optimal BiPAP pressure was selected for this patient ( 19 / 15 cm of water). - Mild central sleep apnea occurred during the diagnostic portion of the study (CAI = 5.5/hour). - Moderate oxygen desaturation was noted during the diagnostic portion of the study (Min O2 = 75.0%). On BIPAP 19/15, minimum O2 saturation was 88%, Mean 92.6%. - The patient snored with moderate snoring volume during the diagnostic portion of the study. - EKG findings include PVCs. - Clinically significant periodic limb movements of sleep did not occur during the study. - Sleep talking noted. - Sleep remained fragmented throughout with frequent breif arousals and awakening.    RECOMMENDATIONS - Trial of BiPAP therapy on 19/15 cm H2O. - Patient used a Large size [employer] Full Face Simplus mask and heated humidification...     IMAGES: CT T-spine 12/22/2023: IMPRESSION: 1. Stable sclerotic lesion in the right T8 vertebral body, concerning for osseous metastatic disease in the setting of prostate cancer and other probable osseous metastatic disease. 2. Interval revision of thoracolumbar fixation with bilateral posterolateral spinal fixation extending from T7 on the right and T8 on the left through the L4 vertebral body. 3. Mild compression deformity of T10. 4. Schmorl's node involving the inferior endplate of T9. 5. Chronic compression deformity of L1, treated with vertebral augmentation, with persistent posterior displacement of the  posterior vertebral body wall into the spinal canal. Status post decompression laminectomies at this level. 6. Moderate calcific atheromatous disease of the thoracic aorta.    CTA Chest 07/24/2023: Aortic Root: --Valve: 3.2 x 2.6 cm --Sinuses: 5.3 x 4.7 x 4.7 cm --Sinotubular Junction: 5.5 x 5.5 cm   Thoracic Aorta: --Ascending Aorta: 5.4 x 5.3 cm --Aortic Arch: 3.9 x 3.8 cm --Descending Aorta: 3.4 x 3.4 cm   IMPRESSION: 1. Aneurysmal dilatation of the sino-tubular junction and ascending thoracic aorta, unchanged. 2. Similar marked multichamber cardiomegaly. 3. Subsegmental mucous plugging in the right lower lobe, which may reflect bronchitis or aspiration. 4. Aortic Atherosclerosis (ICD10-I70.0). Coronary artery calcifications. Assessment for potential risk factor modification, dietary therapy or pharmacologic therapy may be warranted, if clinically indicated.        EKG:  EKG 11/11/2023: Normal sinus rhythm with sinus arrhythmia Moderate voltage criteria for  LVH, may be normal variant ( R in aVL , Cornell product ) Nonspecific ST abnormality When compared with ECG of 02-Oct-2023 14:11, No significant change was found Confirmed by Monetta Rogue ([id number]) on 11/11/2023 1:22:14 PM     CV: Cardiac MRI 09/11/2023: IMPRESSION: 1.  Severely dilated ascending aorta and root, 51 mm. 2. Severely dilated left ventricle with normal wall thickness, global hypokinesis with LV EF 40%. 3.  Normal RV size with RV EF 37%. 4. There is severe aortic insufficiency with regurgitant fraction 50% and regurgitant volume 59 mL. Unable to tell cusp number on the aortic valve from this study. 5. Delayed enhancement imaging with a small area of subendocardial LGE in the basal inferolateral wall that looks consistent with small prior MI. There is mid-wall LGE at the inferior RV insertion site that is nonspecific and tends to be related to pressure/volume overload. 6. Elevated extracellular volume  percentage at 35% suggests increased myocardial fibrotic content. - Severe aortic insufficiency in the setting of dilated ascending aorta and aortic root with LV dilation.     Echo 09/05/2023: IMPRESSIONS   1. Left ventricular ejection fraction, by estimation, is 30 to 35%. Left  ventricular ejection fraction by 2D MOD biplane is 33.0 %. The left  ventricle has moderately decreased function. The left ventricle  demonstrates global hypokinesis. Left  ventricular diastolic function could not be evaluated.   2. Right ventricular systolic function is mildly reduced. The right  ventricular size is normal.   3. Left atrial size was moderately dilated.   4. Right atrial size was mildly dilated.   5. The mitral valve is degenerative. Mild mitral valve regurgitation.   6. The aortic valve was not well visualized. Aortic valve regurgitation  is moderate.   7. Aortic dilatation noted. There is severe dilatation of the ascending  aorta, measuring 56 mm.   8. Rhythm strip during this exam demonstrates premature ventricular  contractions and atrial fibrillation.  Comparison(s): Changes from prior study are noted. 07/04/2023: LVEF 35-40%.  - Comparison 07/04/2023: LVEF 35-40%, LV global hypokinesis, LV cavity mildly to moderately dilated, normal RV systolic function, severely dilated LA, moderately dilated RA, mild-moderate MR, moderate AR, ascending aorta 56 mm.     CTA Coronary 07/17/2023: - Aorta: The proximal ascending aorta is anuerysm (52 mm) in size. No calcifications. No dissection. - Aortic Valve:  Trileaflet.  No calcifications. - Coronary Arteries:  Normal coronary origin. Right Dominance. - RCA is a large dominant artery that gives rise to PDA and PLA. The RCA is a torturous artery. In the Proximal RCA there is a mild focal calcification. Mid RCA in a bend there appears to be a mild soft plaque with minimal diffuse calcifications. Distal RCA with two separate minimal mixed plaques. -  Left main is a large artery that gives rise to LAD and LCX arteries. Minimal calcification in the mid left main vessel. - LAD is a large vessel. There is a focal mild calcified plaque in the ostial LAD. There is diffuse minimal (<24%) calcified plaque in the proximal LAD. The mid LAD with mild (25-49%) diffuse calcifications right below is a mid soft plaque. The distal LAD with minimal focal plaques. - First diagonal branch is a medium sized vessel with moderate (50-69%) soft plaque in the proximal portion of the vessel. - LCX is a non-dominant artery that gives rise to one large OM1 branch. Diffuse minimal calcifications throughout the LCX artery. - First obtuse marginal with mild calcified plaque in the proximal  portion of the vessel.  - Coronary Calcium  Score: Left main: 29.3 Left anterior descending artery: 918 Left circumflex artery: 391 Right coronary artery: 621 Total: 1959 Percentile: 88 - Other findings: Normal pulmonary vein drainage into the left atrium. Normal left atrial appendage without a thrombus. Normal size of the pulmonary artery.   IMPRESSION: 1. Coronary calcium  score of 1959. This was 5 percentile for age and sex matched control. 2. Normal coronary origin with right dominance. 3. CAD-RADS 3. Moderate stenosis. Consider symptom-guided anti-ischemic pharmacotherapy as well as risk factor modification per guideline directed care. Additional analysis with CT FFR . 4. Total plaque volume 2015 mm3 which is 94th percentile for age- and, sex-matched controls (calcified plaque 441 mm3; non-calcified plaque 1574 mm3, Low attenuation 8 mm3). TPV is extensive.    CT FFR 07/17/2023: IMPRESSION: 1.  CT FFR analysis showed no significant stenosis. RECOMMENDATIONS: Guideline-directed medical therapy and aggressive risk factor modification for secondary prevention of coronary artery disease.        Past Medical History:  Diagnosis Date   Aneurysm of aortic root     Overview:  Last Assessment & Plan:  Planning to see Dr Army for evaluation of 5cm aneurysm.  - will check PFT in prep for possible procedure / SGY   Aortic atherosclerosis 06/14/2022   Aortic regurgitation    Aortic valve insufficiency    Ascending aortic aneurysm 09/11/2016   Atherosclerotic vascular disease 05/27/2022   Atrial fibrillation (HCC)    Atrial fibrillation with RVR (HCC) 07/05/2022   BMI 27.0-27.9,adult 06/14/2022   BPH (benign prostatic hyperplasia)    BRBPR (bright red blood per rectum) 05/20/2023   Cardiomyopathy (HCC) 09/11/2016   CHF (congestive heart failure) (HCC)    CKD (chronic kidney disease) stage 2, GFR 60-89 ml/min 11/23/2021   Closed fracture of right hip (HCC) 03/10/2023   Coronary artery calcification seen on CT scan 10/13/2017   Dysrhythmia    A. Fib   Essential hypertension 06/14/2022   Gastrointestinal hemorrhage 05/20/2023   Heart murmur    High risk medication use 07/31/2022   History of cardiomyopathy 11/09/2021   History of pulmonary embolism 09/24/2012   Overview:  Last Assessment & Plan:  Hx PE x 2 per notes, ? 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  - needs auto-titration study by [employer] or local company, data to RB to adjust his home device.    OSA (obstructive sleep apnea) 09/13/2022   Prediabetes    Primary osteoarthritis involving multiple joints 06/14/2022    Prostate cancer metastatic to bone (HCC) 06/14/2022   Pulmonary embolism (HCC)    Rhinitis    Sacral wound 04/11/2023   Seasonal allergies 06/14/2022   Spondylolisthesis of lumbar region 12/12/2021   Stage 3a chronic kidney disease (HCC) 06/14/2022   Unstageable pressure ulcer of sacral region [facility]) 03/10/2023    Past Surgical History:  Procedure Laterality Date   APPENDECTOMY     COLONOSCOPY N/A  05/23/2023   Procedure: COLONOSCOPY;  Surgeon: Elicia Claw, MD;  Location: [facility];  Service: Gastroenterology;  Laterality: N/A;   INGUINAL HERNIA REPAIR Right    KYPHOPLASTY     LAMINECTOMY WITH POSTERIOR LATERAL ARTHRODESIS LEVEL 3 N/A 11/24/2023   Procedure: LAMINECTOMY WITH POSTERIOR LATERAL ARTHRODESIS LEVEL 3;  Surgeon: Onetha Kuba, MD;  Location: [facility] OR;  Service: Neurosurgery;  Laterality: N/A;  Posterior lateral fusion with instrumentation T7-T10, extension of construct with connector   LAMINECTOMY WITH POSTERIOR LATERAL ARTHRODESIS LEVEL 4 N/A 09/03/2023   Procedure: Posterior lateral fusion - Thoracic Ten-Thoracic Eleven - Thoracic Eleven -Thoracic Twelve - Thoracic Twelve-Lumbar One - Lumbar One-Lumbar Two - Lumbar Two-Lumbar Three - Lumbar Three-Lumbar Four, laminectomy and foraminotomy transpedicular decompression Lumbar One;  Surgeon: Onetha Kuba, MD;  Location: [facility] OR;  Service: Neurosurgery;  Laterality: N/A;  Posterior lateral fusion - T10-T11    LUMBAR LAMINECTOMY  02/06/2022   Facetectom & foraminotomy for decompression of the cauda equina & nerve root L4-5, Arley PHEBE Dark, MD   PERONEAL NERVE DECOMPRESSION Right 11/24/2023   Procedure: RIGHT PERONEAL NERVE DECOMPRESSION;  Surgeon: Onetha Kuba, MD;  Location: [facility] OR;  Service: Neurosurgery;  Laterality: Right;   TONSILLECTOMY     TOTAL HIP ARTHROPLASTY Right    TOTAL KNEE ARTHROPLASTY Right     MEDICATIONS:  acetaminophen  (TYLENOL ) 500 MG tablet   amiodarone  (PACERONE ) 200 MG tablet   dapagliflozin  propanediol  (FARXIGA ) 10 MG TABS tablet   methocarbamol  (ROBAXIN ) 500 MG tablet   Methylcobalamin (B-12) 5000 MCG TBDP   metoprolol  succinate (TOPROL -XL) 25 MG 24 hr tablet   Multiple Vitamins-Minerals (MULTIVITAMIN MEN 50+) TABS   polyethylene glycol powder (GLYCOLAX /MIRALAX ) 17 GM/SCOOP powder   potassium chloride  SA (KLOR-CON  M20) 20 MEQ tablet   Rivaroxaban  (XARELTO ) 15 MG TABS tablet   rosuvastatin  (CRESTOR ) 10 MG tablet   spironolactone  (ALDACTONE ) 25 MG tablet   tamsulosin  (FLOMAX ) 0.4 MG CAPS capsule   torsemide  (DEMADEX ) 10 MG tablet   traMADol  (ULTRAM ) 50 MG tablet   No current facility-administered medications for this encounter.    Isaiah Ruder, PA-C Surgical Short Stay/Anesthesiology [facility] Phone [phone] [facility] Phone [phone] 12/29/2023 1:25 PM

## 2024-01-05 ENCOUNTER — Other Ambulatory Visit (HOSPITAL_COMMUNITY)

## 2024-01-05 ENCOUNTER — Inpatient Hospital Stay (HOSPITAL_COMMUNITY): Payer: Self-pay | Admitting: Vascular Surgery

## 2024-01-05 ENCOUNTER — Inpatient Hospital Stay (HOSPITAL_COMMUNITY): Admitting: Anesthesiology

## 2024-01-05 ENCOUNTER — Encounter (HOSPITAL_COMMUNITY)
Admission: RE | Disposition: A | Discharge status: Home Health | Payer: Self-pay | Source: Home / Self Care | Attending: Neurosurgery

## 2024-01-05 ENCOUNTER — Ambulatory Visit (HOSPITAL_COMMUNITY)

## 2024-01-05 ENCOUNTER — Encounter (HOSPITAL_COMMUNITY): Admitting: Cardiology

## 2024-01-05 ENCOUNTER — Inpatient Hospital Stay (HOSPITAL_COMMUNITY)
Admission: RE | Admit: 2024-01-05 | Discharge: 2024-01-07 | DRG: 457 | Disposition: A | Discharge status: Home Health | Attending: Neurosurgery | Admitting: Neurosurgery

## 2024-01-05 ENCOUNTER — Inpatient Hospital Stay (HOSPITAL_COMMUNITY)

## 2024-01-05 ENCOUNTER — Encounter (HOSPITAL_COMMUNITY): Payer: Self-pay | Admitting: Neurosurgery

## 2024-01-05 ENCOUNTER — Other Ambulatory Visit: Payer: Self-pay

## 2024-01-05 DIAGNOSIS — R293 Abnormal posture: Secondary | ICD-10-CM

## 2024-01-05 DIAGNOSIS — I129 Hypertensive chronic kidney disease with stage 1 through stage 4 chronic kidney disease, or unspecified chronic kidney disease: Secondary | ICD-10-CM | POA: Diagnosis not present

## 2024-01-05 DIAGNOSIS — Z6827 Body mass index (BMI) 27.0-27.9, adult: Secondary | ICD-10-CM

## 2024-01-05 DIAGNOSIS — N182 Chronic kidney disease, stage 2 (mild): Secondary | ICD-10-CM | POA: Diagnosis not present

## 2024-01-05 DIAGNOSIS — Z823 Family history of stroke: Secondary | ICD-10-CM | POA: Diagnosis not present

## 2024-01-05 DIAGNOSIS — C61 Malignant neoplasm of prostate: Secondary | ICD-10-CM

## 2024-01-05 DIAGNOSIS — M6281 Muscle weakness (generalized): Secondary | ICD-10-CM

## 2024-01-05 DIAGNOSIS — Z7901 Long term (current) use of anticoagulants: Secondary | ICD-10-CM | POA: Diagnosis not present

## 2024-01-05 DIAGNOSIS — Z86711 Personal history of pulmonary embolism: Secondary | ICD-10-CM

## 2024-01-05 DIAGNOSIS — I482 Chronic atrial fibrillation, unspecified: Secondary | ICD-10-CM | POA: Diagnosis present

## 2024-01-05 DIAGNOSIS — M40294 Other kyphosis, thoracic region: Principal | ICD-10-CM | POA: Diagnosis present

## 2024-01-05 DIAGNOSIS — N400 Enlarged prostate without lower urinary tract symptoms: Secondary | ICD-10-CM | POA: Diagnosis present

## 2024-01-05 DIAGNOSIS — Z96651 Presence of right artificial knee joint: Secondary | ICD-10-CM | POA: Diagnosis present

## 2024-01-05 DIAGNOSIS — G5731 Lesion of lateral popliteal nerve, right lower limb: Secondary | ICD-10-CM

## 2024-01-05 DIAGNOSIS — M532X6 Spinal instabilities, lumbar region: Secondary | ICD-10-CM | POA: Diagnosis present

## 2024-01-05 DIAGNOSIS — I2510 Atherosclerotic heart disease of native coronary artery without angina pectoris: Secondary | ICD-10-CM | POA: Diagnosis present

## 2024-01-05 DIAGNOSIS — Z881 Allergy status to other antibiotic agents status: Secondary | ICD-10-CM | POA: Diagnosis not present

## 2024-01-05 DIAGNOSIS — Z820 Family history of epilepsy and other diseases of the nervous system: Secondary | ICD-10-CM | POA: Diagnosis not present

## 2024-01-05 DIAGNOSIS — M549 Dorsalgia, unspecified: Secondary | ICD-10-CM | POA: Diagnosis present

## 2024-01-05 DIAGNOSIS — N1831 Chronic kidney disease, stage 3a: Secondary | ICD-10-CM | POA: Diagnosis present

## 2024-01-05 DIAGNOSIS — Z96641 Presence of right artificial hip joint: Secondary | ICD-10-CM | POA: Diagnosis present

## 2024-01-05 DIAGNOSIS — Z833 Family history of diabetes mellitus: Secondary | ICD-10-CM

## 2024-01-05 DIAGNOSIS — Z79899 Other long term (current) drug therapy: Secondary | ICD-10-CM | POA: Diagnosis not present

## 2024-01-05 DIAGNOSIS — E7800 Pure hypercholesterolemia, unspecified: Secondary | ICD-10-CM | POA: Diagnosis present

## 2024-01-05 DIAGNOSIS — Z8546 Personal history of malignant neoplasm of prostate: Secondary | ICD-10-CM

## 2024-01-05 DIAGNOSIS — Z888 Allergy status to other drugs, medicaments and biological substances status: Secondary | ICD-10-CM | POA: Diagnosis not present

## 2024-01-05 DIAGNOSIS — R269 Unspecified abnormalities of gait and mobility: Principal | ICD-10-CM

## 2024-01-05 DIAGNOSIS — Z8249 Family history of ischemic heart disease and other diseases of the circulatory system: Secondary | ICD-10-CM

## 2024-01-05 DIAGNOSIS — T84296A Other mechanical complication of internal fixation device of vertebrae, initial encounter: Secondary | ICD-10-CM

## 2024-01-05 DIAGNOSIS — S32009K Unspecified fracture of unspecified lumbar vertebra, subsequent encounter for fracture with nonunion: Principal | ICD-10-CM | POA: Diagnosis present

## 2024-01-05 DIAGNOSIS — I130 Hypertensive heart and chronic kidney disease with heart failure and stage 1 through stage 4 chronic kidney disease, or unspecified chronic kidney disease: Secondary | ICD-10-CM | POA: Diagnosis present

## 2024-01-05 DIAGNOSIS — S22000A Wedge compression fracture of unspecified thoracic vertebra, initial encounter for closed fracture: Secondary | ICD-10-CM

## 2024-01-05 DIAGNOSIS — S22070A Wedge compression fracture of T9-T10 vertebra, initial encounter for closed fracture: Secondary | ICD-10-CM | POA: Diagnosis present

## 2024-01-05 HISTORY — PX: LAMINECTOMY WITH POSTERIOR LATERAL ARTHRODESIS LEVEL 3: SHX6337

## 2024-01-05 SURGERY — LAMINECTOMY WITH POSTERIOR LATERAL ARTHRODESIS LEVEL 3
Anesthesia: General | Site: Back

## 2024-01-05 MED ORDER — PANTOPRAZOLE SODIUM 40 MG IV SOLR: 40.0000 mg | Freq: Every day | INTRAVENOUS | Status: DC

## 2024-01-05 MED ORDER — LIDOCAINE 2% (20 MG/ML) 5 ML SYRINGE
INTRAMUSCULAR | Status: AC
  Filled 2024-01-05: qty 5

## 2024-01-05 MED ORDER — POLYETHYLENE GLYCOL 3350 17 G PO PACK: 17.0000 g | PACK | Freq: Every day | ORAL | Status: DC | PRN

## 2024-01-05 MED ORDER — FENTANYL CITRATE (PF) 100 MCG/2ML IJ SOLN
INTRAMUSCULAR | Status: AC
  Filled 2024-01-05: qty 2

## 2024-01-05 MED ORDER — ADULT MULTIVITAMIN W/MINERALS CH
1.0000 | ORAL_TABLET | Freq: Every day | ORAL | Status: DC
  Administered 2024-01-06: 1 via ORAL
  Filled 2024-01-05: qty 1

## 2024-01-05 MED ORDER — SODIUM CHLORIDE 0.9 % IV SOLN: INTRAVENOUS | Status: DC | PRN

## 2024-01-05 MED ORDER — DEXMEDETOMIDINE HCL IN NACL 80 MCG/20ML IV SOLN
INTRAVENOUS | Status: DC | PRN
  Administered 2024-01-05 (×2): 4 ug via INTRAVENOUS

## 2024-01-05 MED ORDER — LIDOCAINE 2% (20 MG/ML) 5 ML SYRINGE
INTRAMUSCULAR | Status: DC | PRN
  Administered 2024-01-05: 60 mg via INTRAVENOUS

## 2024-01-05 MED ORDER — POTASSIUM CHLORIDE CRYS ER 20 MEQ PO TBCR
20.0000 meq | EXTENDED_RELEASE_TABLET | Freq: Every day | ORAL | Status: DC
  Administered 2024-01-06: 20 meq via ORAL
  Filled 2024-01-05: qty 1

## 2024-01-05 MED ORDER — CHLORHEXIDINE GLUCONATE 4 % EX SOLN: 1.0000 | CUTANEOUS | 1 refills | Status: AC

## 2024-01-05 MED ORDER — CHLORHEXIDINE GLUCONATE 0.12 % MT SOLN
15.0000 mL | Freq: Once | OROMUCOSAL | Status: AC
  Administered 2024-01-05: 15 mL via OROMUCOSAL
  Filled 2024-01-05: qty 15

## 2024-01-05 MED ORDER — LACTATED RINGERS IV SOLN: INTRAVENOUS | Status: DC

## 2024-01-05 MED ORDER — VANCOMYCIN HCL IN DEXTROSE 1-5 GM/200ML-% IV SOLN
INTRAVENOUS | Status: AC
  Filled 2024-01-05: qty 200

## 2024-01-05 MED ORDER — ONDANSETRON HCL 4 MG/2ML IJ SOLN: 4.0000 mg | Freq: Four times a day (QID) | INTRAMUSCULAR | Status: DC | PRN

## 2024-01-05 MED ORDER — BUPIVACAINE HCL (PF) 0.25 % IJ SOLN
INTRAMUSCULAR | Status: AC
  Filled 2024-01-05: qty 30

## 2024-01-05 MED ORDER — PANTOPRAZOLE SODIUM 40 MG PO TBEC
40.0000 mg | DELAYED_RELEASE_TABLET | Freq: Every day | ORAL | Status: DC
  Administered 2024-01-05 – 2024-01-06 (×2): 40 mg via ORAL
  Filled 2024-01-05 (×2): qty 1

## 2024-01-05 MED ORDER — CHLORHEXIDINE GLUCONATE CLOTH 2 % EX PADS: 6.0000 | MEDICATED_PAD | Freq: Once | CUTANEOUS | Status: DC

## 2024-01-05 MED ORDER — ONDANSETRON HCL 4 MG/2ML IJ SOLN
INTRAMUSCULAR | Status: AC
  Filled 2024-01-05: qty 2

## 2024-01-05 MED ORDER — HYDROMORPHONE HCL 1 MG/ML IJ SOLN
INTRAMUSCULAR | Status: AC
  Filled 2024-01-05: qty 0.5

## 2024-01-05 MED ORDER — ACETAMINOPHEN 500 MG PO TABS
1000.0000 mg | ORAL_TABLET | Freq: Once | ORAL | Status: DC
  Filled 2024-01-05: qty 2

## 2024-01-05 MED ORDER — FENTANYL CITRATE (PF) 250 MCG/5ML IJ SOLN
INTRAMUSCULAR | Status: AC
  Filled 2024-01-05: qty 5

## 2024-01-05 MED ORDER — OXYCODONE HCL 5 MG PO TABS: 5.0000 mg | ORAL_TABLET | Freq: Once | ORAL | Status: DC | PRN

## 2024-01-05 MED ORDER — LIDOCAINE-EPINEPHRINE 1 %-1:100000 IJ SOLN
INTRAMUSCULAR | Status: DC | PRN
  Administered 2024-01-05: 18 mL

## 2024-01-05 MED ORDER — ACETAMINOPHEN 325 MG PO TABS: 650.0000 mg | ORAL_TABLET | ORAL | Status: DC | PRN

## 2024-01-05 MED ORDER — SODIUM CHLORIDE 0.9% FLUSH: 3.0000 mL | INTRAVENOUS | Status: DC | PRN

## 2024-01-05 MED ORDER — PHENOL 1.4 % MT LIQD: 1.0000 | OROMUCOSAL | Status: DC | PRN

## 2024-01-05 MED ORDER — SODIUM CHLORIDE 0.9% FLUSH
3.0000 mL | Freq: Two times a day (BID) | INTRAVENOUS | Status: DC
  Administered 2024-01-05 – 2024-01-06 (×3): 3 mL via INTRAVENOUS

## 2024-01-05 MED ORDER — PROPOFOL 10 MG/ML IV BOLUS
INTRAVENOUS | Status: AC
  Filled 2024-01-05: qty 20

## 2024-01-05 MED ORDER — OXYCODONE HCL 5 MG PO TABS
10.0000 mg | ORAL_TABLET | ORAL | Status: DC | PRN
  Administered 2024-01-07: 10 mg via ORAL
  Filled 2024-01-05 (×2): qty 2

## 2024-01-05 MED ORDER — TORSEMIDE 20 MG PO TABS
10.0000 mg | ORAL_TABLET | ORAL | Status: DC
  Administered 2024-01-06 – 2024-01-07 (×2): 10 mg via ORAL
  Filled 2024-01-05 (×3): qty 1

## 2024-01-05 MED ORDER — DAPAGLIFLOZIN PROPANEDIOL 10 MG PO TABS
10.0000 mg | ORAL_TABLET | Freq: Every day | ORAL | Status: DC
  Administered 2024-01-06: 10 mg via ORAL
  Filled 2024-01-05: qty 1

## 2024-01-05 MED ORDER — PROPOFOL 10 MG/ML IV BOLUS
INTRAVENOUS | Status: DC | PRN
  Administered 2024-01-05: 90 mg via INTRAVENOUS

## 2024-01-05 MED ORDER — HYDROMORPHONE HCL 1 MG/ML IJ SOLN
INTRAMUSCULAR | Status: DC | PRN
  Administered 2024-01-05: .5 mg via INTRAVENOUS

## 2024-01-05 MED ORDER — ORAL CARE MOUTH RINSE: 15.0000 mL | Freq: Once | OROMUCOSAL | Status: AC

## 2024-01-05 MED ORDER — AMIODARONE HCL 200 MG PO TABS
200.0000 mg | ORAL_TABLET | Freq: Every day | ORAL | Status: DC
  Administered 2024-01-06: 200 mg via ORAL
  Filled 2024-01-05 (×2): qty 1

## 2024-01-05 MED ORDER — LACTATED RINGERS IV SOLN: INTRAVENOUS | Status: DC | PRN

## 2024-01-05 MED ORDER — LIDOCAINE-EPINEPHRINE 1 %-1:100000 IJ SOLN
INTRAMUSCULAR | Status: AC
  Filled 2024-01-05: qty 1

## 2024-01-05 MED ORDER — ALUM & MAG HYDROXIDE-SIMETH 200-200-20 MG/5ML PO SUSP: 30.0000 mL | Freq: Four times a day (QID) | ORAL | Status: DC | PRN

## 2024-01-05 MED ORDER — EPHEDRINE SULFATE-NACL 50-0.9 MG/10ML-% IV SOSY
PREFILLED_SYRINGE | INTRAVENOUS | Status: DC | PRN
  Administered 2024-01-05: 10 mg via INTRAVENOUS
  Administered 2024-01-05: 5 mg via INTRAVENOUS

## 2024-01-05 MED ORDER — CEFAZOLIN SODIUM-DEXTROSE 2-4 GM/100ML-% IV SOLN
2.0000 g | Freq: Three times a day (TID) | INTRAVENOUS | Status: DC
  Administered 2024-01-05 – 2024-01-07 (×5): 2 g via INTRAVENOUS
  Filled 2024-01-05 (×5): qty 100

## 2024-01-05 MED ORDER — ONDANSETRON HCL 4 MG/2ML IJ SOLN: 4.0000 mg | Freq: Once | INTRAMUSCULAR | Status: DC | PRN

## 2024-01-05 MED ORDER — MENTHOL 3 MG MT LOZG: 1.0000 | LOZENGE | OROMUCOSAL | Status: DC | PRN

## 2024-01-05 MED ORDER — TAMSULOSIN HCL 0.4 MG PO CAPS
0.4000 mg | ORAL_CAPSULE | Freq: Two times a day (BID) | ORAL | Status: DC
  Administered 2024-01-05 – 2024-01-06 (×3): 0.4 mg via ORAL
  Filled 2024-01-05 (×3): qty 1

## 2024-01-05 MED ORDER — ROCURONIUM BROMIDE 10 MG/ML (PF) SYRINGE
PREFILLED_SYRINGE | INTRAVENOUS | Status: AC
  Filled 2024-01-05: qty 10

## 2024-01-05 MED ORDER — CEFAZOLIN SODIUM-DEXTROSE 2-4 GM/100ML-% IV SOLN
2.0000 g | INTRAVENOUS | Status: AC
  Administered 2024-01-05: 2 g via INTRAVENOUS
  Filled 2024-01-05: qty 100

## 2024-01-05 MED ORDER — SODIUM CHLORIDE 0.9 % IV SOLN
250.0000 mL | INTRAVENOUS | Status: AC
  Administered 2024-01-05: 250 mL via INTRAVENOUS

## 2024-01-05 MED ORDER — TRAMADOL HCL 50 MG PO TABS
100.0000 mg | ORAL_TABLET | Freq: Three times a day (TID) | ORAL | Status: DC | PRN
  Administered 2024-01-05: 100 mg via ORAL
  Filled 2024-01-05: qty 2

## 2024-01-05 MED ORDER — ROSUVASTATIN CALCIUM 5 MG PO TABS
10.0000 mg | ORAL_TABLET | Freq: Every evening | ORAL | Status: DC
  Administered 2024-01-05 – 2024-01-06 (×2): 10 mg via ORAL
  Filled 2024-01-05 (×2): qty 2

## 2024-01-05 MED ORDER — OXYCODONE HCL 5 MG/5ML PO SOLN: 5.0000 mg | Freq: Once | ORAL | Status: DC | PRN

## 2024-01-05 MED ORDER — ONDANSETRON HCL 4 MG PO TABS: 4.0000 mg | ORAL_TABLET | Freq: Four times a day (QID) | ORAL | Status: DC | PRN

## 2024-01-05 MED ORDER — FENTANYL CITRATE (PF) 250 MCG/5ML IJ SOLN
INTRAMUSCULAR | Status: DC | PRN
  Administered 2024-01-05 (×5): 50 ug via INTRAVENOUS

## 2024-01-05 MED ORDER — ACETAMINOPHEN 500 MG PO TABS
1000.0000 mg | ORAL_TABLET | Freq: Two times a day (BID) | ORAL | Status: DC
  Administered 2024-01-05 – 2024-01-07 (×4): 1000 mg via ORAL
  Filled 2024-01-05 (×4): qty 2

## 2024-01-05 MED ORDER — ONDANSETRON HCL 4 MG/2ML IJ SOLN
INTRAMUSCULAR | Status: DC | PRN
  Administered 2024-01-05: 4 mg via INTRAVENOUS

## 2024-01-05 MED ORDER — DEXAMETHASONE SOD PHOSPHATE PF 10 MG/ML IJ SOLN
INTRAMUSCULAR | Status: DC | PRN
  Administered 2024-01-05: 5 mg via INTRAVENOUS

## 2024-01-05 MED ORDER — METOPROLOL SUCCINATE 12.5 MG HALF TABLET
12.5000 mg | ORAL_TABLET | Freq: Two times a day (BID) | ORAL | Status: DC
  Administered 2024-01-05 – 2024-01-06 (×3): 12.5 mg via ORAL
  Filled 2024-01-05 (×5): qty 1

## 2024-01-05 MED ORDER — HYDROMORPHONE HCL 1 MG/ML IJ SOLN: 0.5000 mg | INTRAMUSCULAR | Status: DC | PRN

## 2024-01-05 MED ORDER — BUPIVACAINE LIPOSOME 1.3 % IJ SUSP
INTRAMUSCULAR | Status: AC
  Filled 2024-01-05: qty 20

## 2024-01-05 MED ORDER — TRAMADOL HCL 50 MG PO TABS
100.0000 mg | ORAL_TABLET | Freq: Four times a day (QID) | ORAL | Status: DC | PRN
  Administered 2024-01-06 – 2024-01-07 (×6): 100 mg via ORAL
  Filled 2024-01-05 (×6): qty 2

## 2024-01-05 MED ORDER — VITAMIN B-12 1000 MCG PO TABS
5000.0000 ug | ORAL_TABLET | Freq: Every day | ORAL | Status: DC
  Administered 2024-01-06: 5000 ug via ORAL
  Filled 2024-01-05: qty 5

## 2024-01-05 MED ORDER — VANCOMYCIN HCL 1000 MG IV SOLR
1000.0000 mg | Freq: Once | INTRAVENOUS | Status: AC
  Administered 2024-01-05: 1000 mg via INTRAVENOUS

## 2024-01-05 MED ORDER — PHENYLEPHRINE 80 MCG/ML (10ML) SYRINGE FOR IV PUSH (FOR BLOOD PRESSURE SUPPORT)
PREFILLED_SYRINGE | INTRAVENOUS | Status: DC | PRN
  Administered 2024-01-05: 80 ug via INTRAVENOUS

## 2024-01-05 MED ORDER — ACETAMINOPHEN 650 MG RE SUPP: 650.0000 mg | RECTAL | Status: DC | PRN

## 2024-01-05 MED ORDER — THROMBIN 20000 UNITS EX SOLR: CUTANEOUS | Status: DC | PRN

## 2024-01-05 MED ORDER — SUGAMMADEX SODIUM 200 MG/2ML IV SOLN
INTRAVENOUS | Status: DC | PRN
  Administered 2024-01-05: 200 mg via INTRAVENOUS

## 2024-01-05 MED ORDER — SPIRONOLACTONE 25 MG PO TABS
25.0000 mg | ORAL_TABLET | Freq: Every day | ORAL | Status: DC
  Administered 2024-01-05 – 2024-01-06 (×2): 25 mg via ORAL
  Filled 2024-01-05 (×2): qty 1

## 2024-01-05 MED ORDER — FENTANYL CITRATE (PF) 100 MCG/2ML IJ SOLN
25.0000 ug | INTRAMUSCULAR | Status: DC | PRN
  Administered 2024-01-05: 25 ug via INTRAVENOUS

## 2024-01-05 MED ORDER — METHOCARBAMOL 500 MG PO TABS
500.0000 mg | ORAL_TABLET | Freq: Four times a day (QID) | ORAL | Status: DC | PRN
  Administered 2024-01-06 – 2024-01-07 (×4): 500 mg via ORAL
  Filled 2024-01-05 (×4): qty 1

## 2024-01-05 MED ORDER — ROCURONIUM BROMIDE 10 MG/ML (PF) SYRINGE
PREFILLED_SYRINGE | INTRAVENOUS | Status: DC | PRN
  Administered 2024-01-05: 20 mg via INTRAVENOUS
  Administered 2024-01-05: 60 mg via INTRAVENOUS

## 2024-01-05 MED ORDER — ALBUMIN HUMAN 5 % IV SOLN: INTRAVENOUS | Status: DC | PRN

## 2024-01-05 MED ORDER — MUPIROCIN 2 % EX OINT: 1.0000 | TOPICAL_OINTMENT | Freq: Two times a day (BID) | CUTANEOUS | 0 refills | Status: AC

## 2024-01-05 MED ORDER — B-12 5000 MCG PO TBDP: 5000.0000 ug | ORAL_TABLET | Freq: Every day | ORAL | Status: DC

## 2024-01-05 MED ORDER — THROMBIN 20000 UNITS EX SOLR
CUTANEOUS | Status: AC
  Filled 2024-01-05: qty 20000

## 2024-01-05 MED ORDER — SUGAMMADEX SODIUM 200 MG/2ML IV SOLN
INTRAVENOUS | Status: AC
  Filled 2024-01-05: qty 2

## 2024-01-05 SURGICAL SUPPLY — 56 items
BAG COUNTER SPONGE SURGICOUNT (BAG) ×1 IMPLANT
BENZOIN TINCTURE PRP APPL 2/3 (GAUZE/BANDAGES/DRESSINGS) ×1 IMPLANT
BLADE CLIPPER SURG (BLADE) IMPLANT
BLADE SURG 11 STRL SS (BLADE) ×1 IMPLANT
BUR MATCHSTICK NEURO 3.0 LAGG (BURR) ×1 IMPLANT
BUR PRECISION FLUTE 6.0 (BURR) ×1 IMPLANT
CANISTER SUCTION 3000ML PPV (SUCTIONS) ×1 IMPLANT
CAP LOCKING THREADED (Cap) IMPLANT
CNTNR URN SCR LID CUP LEK RST (MISCELLANEOUS) ×1 IMPLANT
COVER BACK TABLE 60X90IN (DRAPES) ×1 IMPLANT
Creo Amp Screw (Screw) IMPLANT
DERMABOND ADVANCED .7 DNX12 (GAUZE/BANDAGES/DRESSINGS) ×1 IMPLANT
DRAPE C-ARM 42X72 X-RAY (DRAPES) ×2 IMPLANT
DRAPE HALF SHEET 40X57 (DRAPES) IMPLANT
DRAPE LAPAROTOMY 100X72X124 (DRAPES) ×1 IMPLANT
DRAPE SURG 17X23 STRL (DRAPES) ×1 IMPLANT
DRSG OPSITE 4X5.5 SM (GAUZE/BANDAGES/DRESSINGS) IMPLANT
DRSG OPSITE POSTOP 4X10 (GAUZE/BANDAGES/DRESSINGS) IMPLANT
DURAPREP 26ML APPLICATOR (WOUND CARE) ×1 IMPLANT
ELECTRODE REM PT RTRN 9FT ADLT (ELECTROSURGICAL) ×1 IMPLANT
EVACUATOR 1/8 PVC DRAIN (DRAIN) ×1 IMPLANT
GAUZE 4X4 16PLY ~~LOC~~+RFID DBL (SPONGE) IMPLANT
GAUZE SPONGE 4X4 12PLY STRL (GAUZE/BANDAGES/DRESSINGS) ×1 IMPLANT
GLOVE BIO SURGEON STRL SZ7 (GLOVE) ×1 IMPLANT
GLOVE BIO SURGEON STRL SZ8 (GLOVE) ×2 IMPLANT
GLOVE BIOGEL PI IND STRL 7.0 (GLOVE) IMPLANT
GLOVE INDICATOR 8.5 STRL (GLOVE) ×2 IMPLANT
GLOVE SURG SS PI 8.0 STRL IVOR (GLOVE) IMPLANT
GOWN STRL REUS W/ TWL LRG LVL3 (GOWN DISPOSABLE) ×1 IMPLANT
GOWN STRL REUS W/ TWL XL LVL3 (GOWN DISPOSABLE) ×2 IMPLANT
GOWN STRL REUS W/TWL 2XL LVL3 (GOWN DISPOSABLE) IMPLANT
GOWN STRL SURGICAL XL XLNG (GOWN DISPOSABLE) IMPLANT
GRAFT BN 10X1XDBM MAGNIFUSE (Bone Implant) IMPLANT
GRAFT BNE MATRIX VG FRMBL L 10 (Bone Implant) IMPLANT
KIT BASIN OR (CUSTOM PROCEDURE TRAY) ×1 IMPLANT
KIT TURNOVER KIT B (KITS) ×1 IMPLANT
NEEDLE HYPO 21X1.5 SAFETY (NEEDLE) ×1 IMPLANT
NEEDLE HYPO 25X1 1.5 SAFETY (NEEDLE) ×1 IMPLANT
PACK LAMINECTOMY NEURO (CUSTOM PROCEDURE TRAY) ×1 IMPLANT
PAD ARMBOARD POSITIONER FOAM (MISCELLANEOUS) ×3 IMPLANT
ROD SPINE CVD CREO 5.5X150 (Rod) IMPLANT
SCREW SP CREO AMP TH 4.5X35 (Screw) IMPLANT
SCREW SP CREO AMP TH 4.5X40 (Screw) IMPLANT
SOLN 0.9% NACL POUR BTL 1000ML (IV SOLUTION) ×1 IMPLANT
SOLN STERILE WATER BTL 1000 ML (IV SOLUTION) ×1 IMPLANT
SPIKE FLUID TRANSFER (MISCELLANEOUS) ×1 IMPLANT
SPONGE SURGIFOAM ABS GEL 100 (HEMOSTASIS) ×1 IMPLANT
SPONGE T-LAP 4X18 ~~LOC~~+RFID (SPONGE) IMPLANT
STRIP CLOSURE SKIN 1/2X4 (GAUZE/BANDAGES/DRESSINGS) ×2 IMPLANT
SUT VIC AB 0 CT1 18XCR BRD8 (SUTURE) ×2 IMPLANT
SUT VIC AB 2-0 CT1 18 (SUTURE) ×1 IMPLANT
SUT VIC AB 4-0 PS2 27 (SUTURE) ×1 IMPLANT
SYR 20ML LL LF (SYRINGE) ×1 IMPLANT
TOWEL GREEN STERILE (TOWEL DISPOSABLE) ×1 IMPLANT
TOWEL GREEN STERILE FF (TOWEL DISPOSABLE) ×1 IMPLANT
TRAY FOLEY MTR SLVR 16FR STAT (SET/KITS/TRAYS/PACK) ×1 IMPLANT

## 2024-01-05 NOTE — H&P (Signed)
 Aaron Richmond is an 78 y.o. male.   Chief Complaint: Back pain HPI: 78 year old gentleman status post thoracolumbar fusion last operation he had had a compression fracture at the top part of his fusion had to be revised and extended up with cortical screws however this also has failed these collapsed down Kyphon over at that level with a new compression fracture and pulled out his cortical screws.  So due to his progression of disease and continued kyphosis and deformity I recommended extension of his fusion up to 3 levels above it with pedicle screws tying into his previously existing construct.  We extensively went over the risks and benefits of that operation with him as well as perioperative course expectations of outcome and alternatives to surgery and he understood and agreed to proceed forward.  Past Medical History:  Diagnosis Date   Aneurysm of aortic root    Overview:  Last Assessment & Plan:  Planning to see Dr Army for evaluation of 5cm aneurysm.  - will check PFT in prep for possible procedure / SGY   Aortic atherosclerosis 06/14/2022   Aortic regurgitation    Aortic valve insufficiency    Ascending aortic aneurysm 09/11/2016   Atherosclerotic vascular disease 05/27/2022   Atrial fibrillation (HCC)    Atrial fibrillation with RVR (HCC) 07/05/2022   BMI 27.0-27.9,adult 06/14/2022   BPH (benign prostatic hyperplasia)    BRBPR (bright red blood per rectum) 05/20/2023   Cardiomyopathy (HCC) 09/11/2016   CHF (congestive heart failure) (HCC)    CKD (chronic kidney disease) stage 2, GFR 60-89 ml/min 11/23/2021   Closed fracture of right hip (HCC) 03/10/2023   Coronary artery calcification seen on CT scan 10/13/2017   Dysrhythmia    A. Fib   Essential hypertension 06/14/2022   Gastrointestinal hemorrhage 05/20/2023   Heart murmur    High risk medication use 07/31/2022   History of cardiomyopathy 11/09/2021   History of pulmonary embolism 09/24/2012   Overview:  Last  Assessment & Plan:  Hx PE x 2 per notes, ? 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  - needs auto-titration study by [employer] or local company, data to RB to adjust his home device.    OSA (obstructive sleep apnea) 09/13/2022   Prediabetes    Primary osteoarthritis involving multiple joints 06/14/2022   Prostate cancer metastatic to bone (HCC) 06/14/2022   Pulmonary embolism (HCC)    Rhinitis    Sacral wound 04/11/2023   Seasonal allergies 06/14/2022   Spondylolisthesis of lumbar region 12/12/2021   Stage 3a chronic kidney disease (HCC) 06/14/2022   Unstageable pressure ulcer of sacral region (HCC) 03/10/2023    Past Surgical History:  Procedure Laterality Date   APPENDECTOMY     COLONOSCOPY N/A 05/23/2023   Procedure: COLONOSCOPY;  Surgeon: Elicia Claw, MD;  Location: [facility];  Service: Gastroenterology;  Laterality: N/A;   INGUINAL HERNIA REPAIR Right    KYPHOPLASTY     LAMINECTOMY  WITH POSTERIOR LATERAL ARTHRODESIS LEVEL 3 N/A 11/24/2023   Procedure: LAMINECTOMY WITH POSTERIOR LATERAL ARTHRODESIS LEVEL 3;  Surgeon: Onetha Kuba, MD;  Location: [facility] OR;  Service: Neurosurgery;  Laterality: N/A;  Posterior lateral fusion with instrumentation T7-T10, extension of construct with connector   LAMINECTOMY WITH POSTERIOR LATERAL ARTHRODESIS LEVEL 4 N/A 09/03/2023   Procedure: Posterior lateral fusion - Thoracic  Ten-Thoracic Eleven - Thoracic Eleven -Thoracic Twelve - Thoracic Twelve-Lumbar One - Lumbar One-Lumbar Two - Lumbar Two-Lumbar Three - Lumbar Three-Lumbar Four, laminectomy and foraminotomy transpedicular decompression Lumbar One;  Surgeon: Onetha Kuba, MD;  Location: [facility] OR;  Service: Neurosurgery;  Laterality: N/A;  Posterior lateral fusion - T10-T11    LUMBAR LAMINECTOMY  02/06/2022   Facetectom & foraminotomy for decompression of the cauda equina & nerve root L4-5, Arley PHEBE Dark, MD   PERONEAL NERVE DECOMPRESSION Right 11/24/2023   Procedure: RIGHT PERONEAL NERVE DECOMPRESSION;  Surgeon: Onetha Kuba, MD;  Location: [facility] OR;  Service: Neurosurgery;  Laterality: Right;   TONSILLECTOMY     TOTAL HIP ARTHROPLASTY Right    TOTAL KNEE ARTHROPLASTY Right     Family History  Problem Relation Age of Onset   Stroke Mother    Alzheimer's disease Mother    Heart attack Mother    Stroke Father    Diabetes Mellitus II Father    Social History:  reports that he has never smoked. He has never used smokeless tobacco. He reports that he does not drink alcohol  and does not use drugs.  Allergies: Allergies[1]  Medications Prior to Admission  Medication Sig Dispense Refill   acetaminophen  (TYLENOL ) 500 MG tablet Take 1,000 mg by mouth 2 (two) times daily.     amiodarone  (PACERONE ) 200 MG tablet Take 1 tablet (200 mg total) by mouth daily. 90 tablet 3   dapagliflozin  propanediol (FARXIGA ) 10 MG TABS tablet Take 1 tablet (10 mg total) by mouth daily. 30 tablet 5   methocarbamol  (ROBAXIN ) 500 MG tablet Take 1 tablet (500 mg total) by mouth 4 (four) times daily. (Patient taking differently: Take 500 mg by mouth every 6 (six) hours as needed for muscle spasms.) 120 tablet 0   Methylcobalamin (B-12) 5000 MCG TBDP Take 5,000 mcg by mouth daily.     metoprolol  succinate (TOPROL -XL) 25 MG 24 hr tablet Take 0.5 tablets (12.5 mg total) by mouth 2 (two) times daily. 60 tablet 5   Multiple Vitamins-Minerals  (MULTIVITAMIN MEN 50+) TABS Take 1 tablet by mouth daily.     polyethylene glycol powder (GLYCOLAX /MIRALAX ) 17 GM/SCOOP powder Take 17 g by mouth daily as needed (constipation).     potassium chloride  SA (KLOR-CON  M20) 20 MEQ tablet Take 1 tablet (20 mEq total) by mouth daily. 30 tablet 5   Rivaroxaban  (XARELTO ) 15 MG TABS tablet Take 15 mg by mouth every evening.     rosuvastatin  (CRESTOR ) 10 MG tablet Take 1 tablet (10 mg total) by mouth daily. (Patient taking differently: Take 10 mg by mouth every evening.) 30 tablet 5   spironolactone  (ALDACTONE ) 25 MG tablet Take 1 tablet (25 mg total) by mouth daily. 90 tablet 3   tamsulosin  (FLOMAX ) 0.4 MG CAPS capsule TAKE 1 CAPSULE BY MOUTH TWICE DAILY 30  MINUTES  FOLLOWING  THE  SAME  MEAL  EACH  DAY (Patient taking differently: Take 0.4 mg by mouth 2 (two) times daily.) 180 capsule 0   torsemide  (DEMADEX ) 10 MG tablet Take 1 tablet (10 mg total) by mouth 2 (two) times daily. (Patient taking  differently: Take 10 mg by mouth See admin instructions. Take 1 tablet (10mg ) by mouth twice daily - in the morning and early afternoon (around 1300).) 60 tablet 2   traMADol  (ULTRAM ) 50 MG tablet Take 2 tablets (100 mg total) by mouth every 8 (eight) hours as needed for moderate pain (pain score 4-6). 180 tablet 0    No results found for this or any previous visit (from the past 48 hours). No results found.  Review of Systems  Musculoskeletal:  Positive for back pain.    Blood pressure (!) 132/50, pulse (!) 59, temperature 98.5 F (36.9 C), temperature source Oral, resp. rate 18, height 5' 10 (1.778 m), weight 76.6 kg, SpO2 94%. Physical Exam HENT:     Head: Normocephalic.     Right Ear: Tympanic membrane normal.     Nose: Nose normal.     Mouth/Throat:     Mouth: Mucous membranes are moist.  Cardiovascular:     Rate and Rhythm: Normal rate.     Pulses: Normal pulses.  Pulmonary:     Effort: Pulmonary effort is normal.  Abdominal:     General:  Abdomen is flat.  Musculoskeletal:        General: Normal range of motion.     Cervical back: Normal range of motion.  Skin:    General: Skin is warm.  Neurological:     Mental Status: He is alert.     Comments: Baseline foot drop on the right otherwise 5 out of 5 lower extremities bilaterally      Assessment/Plan 78 year old gentleman sent for reexploration of fusion with extension utilizing pedicle screws tying in a pre-existing construct.  Arley SHAUNNA Helling, MD 01/05/2024, 10:44 AM       [1]  Allergies Allergen Reactions   Cipro [Ciprofloxacin Hcl] Other (See Comments)    Hx of ascending thoracic aortic aneurysm, increased risk of rupture   Other Other (See Comments)    fluoroquinolone antibiotics - Hx of ascending thoracic aortic aneurysm, increased risk of rupture

## 2024-01-05 NOTE — Transfer of Care (Signed)
 Immediate Anesthesia Transfer of Care Note  Patient: Aaron Richmond  Procedure(s) Performed: Extension of posterior lateral fusion with instrumentation - Thoracic Four-Five/Thoracic Five-Six/Thoracic Six-Seven (Back)  Patient Location: PACU  Anesthesia Type:General  Level of Consciousness: drowsy and patient cooperative  Airway & Oxygen Therapy: Patient Spontanous Breathing and Patient connected to face mask oxygen  Post-op Assessment: Report given to RN and Post -op Vital signs reviewed and stable  Post vital signs: Reviewed and stable  Last Vitals:  Vitals Value Taken Time  BP 149/54 01/05/24 14:48  Temp 97.4   Pulse 71 01/05/24 14:51  Resp 18 01/05/24 14:51  SpO2 89 % 01/05/24 14:51  Vitals shown include unfiled device data.  Last Pain:  Vitals:   01/05/24 0928  TempSrc:   PainSc: 3       Patients Stated Pain Goal: 2 (01/05/24 9071)  Complications: No notable events documented.

## 2024-01-05 NOTE — Op Note (Signed)
 Preoperative diagnosis: Junctional kyphosis failed fusion T7.  Postoperative diagnosis: Same.  Procedure: Exploration fusion move of hardware with removal of bilateral cortical screws at T7-T8 disconnecting from the end and connector previously used.  2.  Placement bilateral pedicle screws at T4, T5, T6, and left T7 replacement new left sided pedicle screw at T7 connecting up with a globus pedicle screw system into the end and connector.  3.  Posterolateral arthrodesis T4-5 T5-6 T6-7 and T7-T8 utilizing locally harvested autograft mixed with Vivigen and Magnifuse.  Surgeon: Arley helling.  Assistant: Dorn Ned.  Anesthesia: General.  EBL: 400.  HPI: 78 year old gentleman previously undergone thoracolumbar fixation developed kyphosis and collapse at the top of his construct with pulling out his previously placed cortical screws.  So due to the patient's progression of clinical syndrome imaging findings and failed conservative treatment I recommended reexploration of fusion with extension up to T4 I extensively went over the risks and benefits of the procedure with him as well as perioperative course expectations of outcome and alternatives to surgery and he understood and agreed to proceed forward.  Operative procedure: Patient was brought into the OR was induced under general esthesia positioned prone on flat rolls the back was prepped and draped in routine sterile fashion his incision was extended cephalad after infiltration of 10 cc lidocaine  with epi and Bovie electrocautery was used to take down the subcutaneous tissue and subperiosteal dissection was carried lamina of T3-T4 T5-T6-T7 expose the previous construct down to the End and connector.  Then under AP fluoroscopy I placed bilateral pedicle screws at T4, T5, T6 and a new 1 on the left T7 removed disconnected the connector remove the rods removed very loose screws at T7-T8 bilaterally.  I then aggressively decorticated the spinal  laminar complex as well as the TPs at T4-5 T5-6 T6-7 and T7-T8 placed an extensive mount of autograft mixed with Vivigen posterior laterally underneath the screws then contoured 2 rods connected up to the End and connector anchored everything in place as well as compressed the T6 against T7 screw to help reduce some of his kyphosis.  Then after everything was tightened down connectors and pedicle screws the remainder of the autograft mix and Magnifuse was placed at T4-5 T5-6 T6-7 T7-T8 and then a drain was placed Exparel  was injected in the fascia and the wound was closed in layers with active Vicryl and a running 4 subcuticular.  Dermabond benzoin Steri-Strips and sterile dressing was applied patient to cover him in stable condition.  At the end the case all sponge counts were correct.

## 2024-01-05 NOTE — Plan of Care (Signed)

## 2024-01-05 NOTE — Anesthesia Postprocedure Evaluation (Signed)
"   Anesthesia Post Note  Patient: Aaron Richmond  Procedure(s) Performed: Extension of posterior lateral fusion with instrumentation - Thoracic Four-Five/Thoracic Five-Six/Thoracic Six-Seven (Back)     Patient location during evaluation: PACU Anesthesia Type: General Level of consciousness: awake and alert Pain management: pain level controlled Vital Signs Assessment: post-procedure vital signs reviewed and stable Respiratory status: spontaneous breathing, nonlabored ventilation and respiratory function stable Cardiovascular status: stable and blood pressure returned to baseline Anesthetic complications: no   No notable events documented.  Last Vitals:  Vitals:   01/05/24 1630 01/05/24 1657  BP: (!) 124/55 (!) 130/45  Pulse: 64 (!) 56  Resp: (!) 22 18  Temp: 36.5 C 36.6 C  SpO2: 97% 97%    Last Pain:  Vitals:   01/05/24 1449  TempSrc:   PainSc: 0-No pain                 Debby FORBES Like      "

## 2024-01-05 NOTE — Anesthesia Procedure Notes (Signed)
 Arterial Line Insertion Start/End12/29/2025 8:55 AM, 01/05/2024 9:00 AM Performed by: Zelphia Norleen HERO, CRNA  Patient location: Pre-op. Preanesthetic checklist: patient identified, IV checked, site marked, risks and benefits discussed, surgical consent, monitors and equipment checked, pre-op evaluation, timeout performed and anesthesia consent Lidocaine  1% used for infiltration Left, radial was placed Catheter size: 20 G Hand hygiene performed  and maximum sterile barriers used   Attempts: 1 Procedure performed using ultrasound to evaluate access site. Ultrasound Notes:image(s) printed for medical record. Following insertion, dressing applied and Biopatch. Post procedure assessment: normal and unchanged  Patient tolerated the procedure well with no immediate complications.

## 2024-01-05 NOTE — Anesthesia Procedure Notes (Signed)
 Procedure Name: Intubation Date/Time: 01/05/2024 11:36 AM  Performed by: Atanacio Arland HERO, CRNAPre-anesthesia Checklist: Patient identified, Emergency Drugs available, Suction available and Patient being monitored Patient Re-evaluated:Patient Re-evaluated prior to induction Oxygen Delivery Method: Circle System Utilized Preoxygenation: Pre-oxygenation with 100% oxygen Induction Type: IV induction Ventilation: Mask ventilation without difficulty Laryngoscope Size: Glidescope and 3 Grade View: Grade I Tube type: Oral Tube size: 7.5 mm Number of attempts: 1 Airway Equipment and Method: Stylet Placement Confirmation: ETT inserted through vocal cords under direct vision, positive ETCO2 and breath sounds checked- equal and bilateral Secured at: 23 cm Tube secured with: Tape Dental Injury: Teeth and Oropharynx as per pre-operative assessment

## 2024-01-06 ENCOUNTER — Encounter (HOSPITAL_COMMUNITY): Payer: Self-pay | Admitting: Neurosurgery

## 2024-01-06 NOTE — Progress Notes (Signed)
 Subjective: Patient reports back pain but tolerable   Objective: Vital signs in last 24 hours: Temp:  [97.4 F (36.3 C)-98.5 F (36.9 C)] 97.8 F (36.6 C) (12/30 0737) Pulse Rate:  [56-70] 67 (12/30 0737) Resp:  [13-22] 17 (12/30 0737) BP: (118-149)/(44-58) 129/51 (12/30 0737) SpO2:  [92 %-99 %] 94 % (12/30 0737) Weight:  [76.6 kg] 76.6 kg (12/29 0851)  Intake/Output from previous day: 12/29 0701 - 12/30 0700 In: 1825 [I.V.:1225; IV Piggyback:600] Out: 1500 [Urine:980; Drains:100; Blood:420] Intake/Output this shift: No intake/output data recorded.  Neurologic: Grossly normal  Lab Results: Lab Results  Component Value Date   WBC 8.6 12/26/2023   HGB 10.9 (L) 12/26/2023   HCT 35.9 (L) 12/26/2023   MCV 94.7 12/26/2023   PLT 264 12/26/2023   Lab Results  Component Value Date   INR 1.4 (H) 05/22/2023   BMET Lab Results  Component Value Date   NA 139 12/26/2023   K 4.2 12/26/2023   CL 105 12/26/2023   CO2 22 12/26/2023   GLUCOSE 130 (H) 12/26/2023   BUN 22 12/26/2023   CREATININE 1.24 12/26/2023   CALCIUM  9.1 12/26/2023    Studies/Results: DG Thoracic Spine 2 View Result Date: 01/05/2024 CLINICAL DATA:  Elective surgery. Extension of posterolateral fusion with instrumentation T4-T5, T5-T6, and T6-T7 EXAM: THORACIC SPINE 2 VIEWS COMPARISON:  Radiograph 12/11/2023 FINDINGS: Two fluoroscopic spot views of the thoracic spine submitted from the operating room. Pedicle screws at multiple levels, difficult to delineate due to coned views. Fluoroscopy time 1 minutes 42 seconds. Dose 53.45 mGy. IMPRESSION: Intraoperative fluoroscopy during thoracic spine surgery. Electronically Signed   By: Andrea Gasman M.D.   On: 01/05/2024 14:19   DG C-Arm 1-60 Min-No Report Result Date: 01/05/2024 Fluoroscopy was utilized by the requesting physician.  No radiographic interpretation.    Assessment/Plan: Postop day 1 extension of thoracic fusion. Doing well. Will work with therapy  today and see how he does   LOS: 1 day    Suzen Lacks Evamarie Raetz 01/06/2024, 8:16 AM

## 2024-01-06 NOTE — Evaluation (Signed)
 Occupational Therapy Evaluation Patient Details Name: Aaron Richmond MRN: [id number] DOB: 02/13/1945 Today's Date: 01/06/2024   History of Present Illness   78 y/o M s/p  extension of posteriro lateral fusion with instrumentation T4-5, T5-6, T6-7  PMH includes AAA, A fib RVR, CHF, CKD, HLD, HTN, sacral wound, R TKA, R THA, posteriolateral laminectomy (11/2023)     Clinical Impressions Pt reports having assist for LB ADLs at baseline, mobilizes with RW. Pt lives with spouse who can assist at d/c. Pt currently needs up to mod A for ADLs, CGA for bed mobility, and min A for transfers with RW. Pt with incr pain during transitional movements. Pt educated on brace wear, back precautions, and compensatory strategies for ADLs but will benefit from continued reinforcement. Pt presenting with impairments listed below, will follow acutely. Recommend HHOT at d/c.      If plan is discharge home, recommend the following:   A little help with walking and/or transfers;A lot of help with bathing/dressing/bathroom;Assistance with cooking/housework;Direct supervision/assist for financial management;Direct supervision/assist for medications management;Assist for transportation;Help with stairs or ramp for entrance     Functional Status Assessment   Patient has had a recent decline in their functional status and demonstrates the ability to make significant improvements in function in a reasonable and predictable amount of time.     Equipment Recommendations   None recommended by OT     Recommendations for Other Services   PT consult     Precautions/Restrictions   Precautions Precautions: Fall;Back Precaution Booklet Issued: Yes (comment) Required Braces or Orthoses: Spinal Brace Spinal Brace: Other (comment) Spinal Brace Comments: CTO brace;  per PA 12/30 can take it off when he is lying in the bed but needs it on if he is sitting up in the bed and OOB Restrictions Weight Bearing  Restrictions Per Provider Order: No     Mobility Bed Mobility Overal bed mobility: Needs Assistance Bed Mobility: Sidelying to Sit, Sit to Sidelying   Sidelying to sit: Contact guard assist     Sit to sidelying: Contact guard assist      Transfers Overall transfer level: Needs assistance Equipment used: Rolling walker (2 wheels) Transfers: Sit to/from Stand Sit to Stand: Min assist                  Balance Overall balance assessment: Needs assistance Sitting-balance support: Feet supported Sitting balance-Leahy Scale: Fair     Standing balance support: Bilateral upper extremity supported, Reliant on assistive device for balance, During functional activity Standing balance-Leahy Scale: Fair                             ADL either performed or assessed with clinical judgement   ADL Overall ADL's : Needs assistance/impaired Eating/Feeding: Supervision/ safety   Grooming: Set up;Sitting   Upper Body Bathing: Sitting;Minimal assistance   Lower Body Bathing: Minimal assistance;Sitting/lateral leans   Upper Body Dressing : Moderate assistance;Sitting;Standing   Lower Body Dressing: Moderate assistance;Sitting/lateral leans;Sit to/from stand   Toilet Transfer: Minimal assistance;Ambulation;Rolling walker (2 wheels)   Toileting- Clothing Manipulation and Hygiene: Moderate assistance       Functional mobility during ADLs: Minimal assistance;Rolling walker (2 wheels)       Vision Baseline Vision/History: 1 Wears glasses Vision Assessment?: No apparent visual deficits     Perception Perception: Not tested       Praxis Praxis: Not tested       Pertinent Vitals/Pain Pain  Assessment Pain Assessment: Faces Pain Score: 5  Faces Pain Scale: Hurts little more Pain Location: back Pain Descriptors / Indicators: Discomfort Pain Intervention(s): Limited activity within patient's tolerance, Monitored during session, Repositioned      Extremity/Trunk Assessment Upper Extremity Assessment Upper Extremity Assessment: Generalized weakness   Lower Extremity Assessment Lower Extremity Assessment: Defer to PT evaluation   Cervical / Trunk Assessment Cervical / Trunk Assessment: Back Surgery   Communication Communication Communication: Impaired Factors Affecting Communication: Hearing impaired   Cognition Arousal: Alert Behavior During Therapy: WFL for tasks assessed/performed Cognition: No apparent impairments                               Following commands: Intact       Cueing  General Comments   Cueing Techniques: Verbal cues  VSS   Exercises     Shoulder Instructions      Home Living Family/patient expects to be discharged to:: Private residence Living Arrangements: Spouse/significant other Available Help at Discharge: Family;Available 24 hours/day Type of Home: House Home Access: Stairs to enter [employer] of Steps: 2 STE, has step down into living room with rail Entrance Stairs-Rails: Left;Right Home Layout: One level     Bathroom Shower/Tub: Producer, Television/film/video: Handicapped height Bathroom Accessibility: Yes   Home Equipment: Agricultural Consultant (2 wheels);Cane - single point;Shower Diplomatic Services Operational Officer (4 wheels);Adaptive equipment;Wheelchair - Radiographer, Therapeutic Comments: Has raised toilet height and BSC placed over at home      Prior Functioning/Environment Prior Level of Function : Needs assist             Mobility Comments: RW ADLs Comments: Pt reports independent    OT Problem List: Decreased strength;Decreased range of motion;Decreased activity tolerance;Decreased safety awareness;Decreased knowledge of use of DME or AE;Decreased knowledge of precautions   OT Treatment/Interventions: Self-care/ADL training;Therapeutic exercise;DME and/or AE instruction;Therapeutic activities;Balance  training;Patient/family education      OT Goals(Current goals can be found in the care plan section)   Acute Rehab OT Goals Patient Stated Goal: none stated OT Goal Formulation: With patient Time For Goal Achievement: 01/20/24 Potential to Achieve Goals: Good ADL Goals Pt Will Perform Upper Body Dressing: with contact guard assist;sitting Pt Will Perform Lower Body Dressing: sitting/lateral leans;sit to/from stand;with adaptive equipment Pt Will Transfer to Toilet: with contact guard assist;ambulating;regular height toilet Pt Will Perform Tub/Shower Transfer: Shower transfer;with contact guard assist;ambulating;shower seat;rolling walker   OT Frequency:  Min 2X/week    Co-evaluation              AM-PAC OT 6 Clicks Daily Activity     Outcome Measure Help from another person eating meals?: None Help from another person taking care of personal grooming?: A Little Help from another person toileting, which includes using toliet, bedpan, or urinal?: A Little Help from another person bathing (including washing, rinsing, drying)?: A Lot Help from another person to put on and taking off regular upper body clothing?: A Lot Help from another person to put on and taking off regular lower body clothing?: A Lot 6 Click Score: 16   End of Session Equipment Utilized During Treatment: Rolling walker (2 wheels);Back brace Nurse Communication: Mobility status  Activity Tolerance: Patient tolerated treatment well Patient left: in bed;with call bell/phone within reach;with family/visitor present  OT Visit Diagnosis: Unsteadiness on feet (R26.81);Other abnormalities of gait and mobility (R26.89);Muscle weakness (generalized) (M62.81)  Time: 8995-8960 OT Time Calculation (min): 35 min Charges:  OT General Charges $OT Visit: 1 Visit OT Evaluation $OT Eval Moderate Complexity: 1 Mod OT Treatments $Self Care/Home Management : 8-22 mins  Erian Lariviere K, OTD, OTR/L SecureChat  Preferred Acute Rehab [phone]   Laneta POUR Koonce 01/06/2024, 12:54 PM

## 2024-01-06 NOTE — Progress Notes (Signed)
 Orthopedic Tech Progress Note Patient Details:  Aaron Richmond 10-08-1945 [id number]  Called in order to [employer] for a [employer] CTO  Patient ID: Aaron Richmond, male   DOB: 08-25-1945, 78 y.o.   MRN: [id number]  Aaron Richmond Pac 01/06/2024, 8:37 AM

## 2024-01-06 NOTE — Evaluation (Signed)
 Physical Therapy Evaluation  Patient Details Name: Aaron Richmond MRN: [id number] DOB: 01-14-1945 Today's Date: 01/06/2024  History of Present Illness  78 y/o M s/p  extension of posteriro lateral fusion with instrumentation T4-5, T5-6, T6-7  PMH includes AAA, A fib RVR, CHF, CKD, HLD, HTN, sacral wound, R TKA, R THA, posteriolateral laminectomy (11/2023)  Clinical Impression  Pt admitted with above diagnosis. At the time of PT eval, pt was able to demonstrate transfers and ambulation with gross min assist to CGA and RW for support. Pt overall with poor maintenance of precautions and required cues throughout functional mobility. Pt was educated on precautions, brace application/wearing schedule, appropriate activity progression, and car transfer. Pt currently with functional limitations due to the deficits listed below (see PT Problem List). Pt will benefit from skilled PT to increase their independence and safety with mobility to allow discharge to the venue listed below.          If plan is discharge home, recommend the following: A little help with walking and/or transfers;A little help with bathing/dressing/bathroom;Assistance with cooking/housework;Assist for transportation;Help with stairs or ramp for entrance   Can travel by private vehicle        Equipment Recommendations None recommended by PT  Recommendations for Other Services       Functional Status Assessment Patient has had a recent decline in their functional status and demonstrates the ability to make significant improvements in function in a reasonable and predictable amount of time.     Precautions / Restrictions Precautions Precautions: Fall;Back Precaution Booklet Issued: Yes (comment) Recall of Precautions/Restrictions: Impaired Precaution/Restrictions Comments: Pt able to recall prec however doesn't adhere to them functionally. Refviewed precautions throughout functional mobility. Required Braces or Orthoses:  Spinal Brace Spinal Brace: Other (comment) Spinal Brace Comments: CTO brace;  per PA 12/30 can take it off when he is lying in the bed but needs it on if he is sitting up in the bed and OOB Restrictions Weight Bearing Restrictions Per Provider Order: No      Mobility  Bed Mobility Overal bed mobility: Needs Assistance Bed Mobility: Rolling, Sidelying to Sit, Sit to Sidelying Rolling: Supervision Sidelying to sit: Contact guard assist     Sit to sidelying: Contact guard assist General bed mobility comments: Light guard to guide pt through proper log roll. Very slow to initiate, requires cues but easily irritated with therapist during education.    Transfers Overall transfer level: Needs assistance Equipment used: Rolling walker (2 wheels) Transfers: Sit to/from Stand Sit to Stand: Min assist           General transfer comment: Light assist to power up to full stand. VC's for hand placement on seated surface for safety.    Ambulation/Gait Ambulation/Gait assistance: Contact guard assist Gait Distance (Feet): 100 Feet Assistive device: Rolling walker (2 wheels) Gait Pattern/deviations: Step-through pattern, Decreased stride length, Decreased dorsiflexion - right Gait velocity: Decreased Gait velocity interpretation: <1.31 ft/sec, indicative of household ambulator   General Gait Details: VC's throughout for improved posture, closer walker proximity and forward gaze. Pt fatigues quickly and limited with ambulation distance.  Stairs Stairs:  (Unable to progress to stair training this session.)          Wheelchair Mobility     Tilt Bed    Modified Rankin (Stroke Patients Only)       Balance Overall balance assessment: Needs assistance Sitting-balance support: Feet supported Sitting balance-Leahy Scale: Fair     Standing balance support: Bilateral upper extremity  supported, Reliant on assistive device for balance, During functional activity Standing  balance-Leahy Scale: Poor                               Pertinent Vitals/Pain Pain Assessment Pain Assessment: Faces Faces Pain Scale: Hurts little more Pain Location: back Pain Descriptors / Indicators: Operative site guarding, Sore Pain Intervention(s): Limited activity within patient's tolerance, Monitored during session, Repositioned    Home Living Family/patient expects to be discharged to:: Private residence Living Arrangements: Spouse/significant other Available Help at Discharge: Family;Available 24 hours/day Type of Home: House Home Access: Stairs to enter Entrance Stairs-Rails: Lawyer of Steps: 2 STE, has step down into living room with rail   Home Layout: One level Home Equipment: Agricultural Consultant (2 wheels);Cane - single point;Shower Diplomatic Services Operational Officer (4 wheels);Adaptive equipment;Wheelchair - manual Additional Comments: Has raised toilet height and BSC placed over at home    Prior Function Prior Level of Function : Needs assist             Mobility Comments: RW ADLs Comments: Pt reports independent     Extremity/Trunk Assessment   Upper Extremity Assessment Upper Extremity Assessment: Overall WFL for tasks assessed    Lower Extremity Assessment Lower Extremity Assessment: Generalized weakness    Cervical / Trunk Assessment Cervical / Trunk Assessment: Back Surgery  Communication   Communication Communication: Impaired Factors Affecting Communication: Hearing impaired    Cognition Arousal: Alert Behavior During Therapy: WFL for tasks assessed/performed                             Following commands: Intact       Cueing Cueing Techniques: Verbal cues, Gestural cues, Tactile cues     General Comments      Exercises     Assessment/Plan    PT Assessment Patient needs continued PT services  PT Problem List Decreased strength;Decreased range of motion;Decreased activity  tolerance;Decreased balance;Decreased mobility;Decreased knowledge of use of DME;Decreased safety awareness;Decreased knowledge of precautions;Pain       PT Treatment Interventions DME instruction;Gait training;Stair training;Functional mobility training;Therapeutic activities;Therapeutic exercise;Balance training;Patient/family education    PT Goals (Current goals can be found in the Care Plan section)  Acute Rehab PT Goals Patient Stated Goal: home yesterday PT Goal Formulation: With patient Time For Goal Achievement: 01/20/24 Potential to Achieve Goals: Good    Frequency Min 5X/week     Co-evaluation               AM-PAC PT 6 Clicks Mobility  Outcome Measure Help needed turning from your back to your side while in a flat bed without using bedrails?: A Little Help needed moving from lying on your back to sitting on the side of a flat bed without using bedrails?: A Little Help needed moving to and from a bed to a chair (including a wheelchair)?: A Little Help needed standing up from a chair using your arms (e.g., wheelchair or bedside chair)?: A Little Help needed to walk in hospital room?: A Little Help needed climbing 3-5 steps with a railing? : Total 6 Click Score: 16    End of Session Equipment Utilized During Treatment: Gait belt;Back brace Activity Tolerance: Patient tolerated treatment well Patient left: in bed;with call bell/phone within reach;with bed alarm set Nurse Communication: Mobility status PT Visit Diagnosis: Unsteadiness on feet (R26.81);Muscle weakness (generalized) (M62.81)    Time: 9094-9060 PT  Time Calculation (min) (ACUTE ONLY): 34 min   Charges:   PT Evaluation $PT Eval Low Complexity: 1 Low PT Treatments $Gait Training: 8-22 mins PT General Charges $$ ACUTE PT VISIT: 1 Visit         Leita Sable, PT, DPT Acute Rehabilitation Services Secure Chat Preferred Office: [phone]   Leita JONETTA Sable 01/06/2024, 1:18 PM

## 2024-01-07 MED ORDER — OXYCODONE HCL 10 MG PO TABS: 5.0000 mg | ORAL_TABLET | Freq: Four times a day (QID) | ORAL | 0 refills | Status: AC | PRN

## 2024-01-07 NOTE — Discharge Instructions (Signed)
 Wound Care Keep incision covered and dry until post op day 3. You may remove the Honeycomb dressing on post op day 3. Leave steri-strips on back.  They will fall off by themselves. Do not put any creams, lotions, or ointments on incision. You are fine to shower. Let water run over incision and pat dry.   Activity Walk each and every day, increasing distance each day. No lifting greater than 8 lbs.  No lifting no bending no twisting no driving . You can ride as a Dealer. If provided with back brace, wear when out of bed.  It is not necessary to wear brace in bed.  Diet Resume your normal diet.  Call Your Doctor If Any of These Occur Redness, drainage, or swelling at the wound.  Temperature greater than 101 degrees. Severe pain not relieved by pain medication. Incision starts to come apart.  Follow Up Appt Call [phone] if you have one or any problem.

## 2024-01-07 NOTE — Progress Notes (Signed)
 Occupational Therapy Treatment Patient Details Name: Aaron Richmond MRN: [id number] DOB: 02-26-1945 Today's Date: 01/07/2024   History of present illness 78 y/o M s/p  extension of posteriro lateral fusion with instrumentation T4-5, T5-6, T6-7  PMH includes AAA, A fib RVR, CHF, CKD, HLD, HTN, sacral wound, R TKA, R THA, posteriolateral laminectomy (11/2023)   OT comments  Pt with slow progression toward goals this session, pain limiting mobility this session. Overall, pt needs min-mod A for  UB/LB dressing, min A for bed mobility and min A for transfers with RW. Pt with incr difficulty donning brace, reviewed back precautions and ADL compensatory strategies. Pt presenting with impairments listed below, will follow acutely. Continue to recommend HHOT at d/c.       If plan is discharge home, recommend the following:  A little help with walking and/or transfers;A lot of help with bathing/dressing/bathroom;Assistance with cooking/housework;Direct supervision/assist for financial management;Direct supervision/assist for medications management;Assist for transportation;Help with stairs or ramp for entrance   Equipment Recommendations  None recommended by OT    Recommendations for Other Services PT consult    Precautions / Restrictions Precautions Precautions: Fall;Back Precaution Booklet Issued: Yes (comment) Recall of Precautions/Restrictions: Impaired Precaution/Restrictions Comments: Pt able to recall prec however doesn't adhere to them functionally. Refviewed precautions throughout functional mobility. Required Braces or Orthoses: Spinal Brace Spinal Brace: Other (comment) Spinal Brace Comments: CTO brace;  per PA 12/30 can take it off when he is lying in the bed but needs it on if he is sitting up in the bed and OOB Restrictions Weight Bearing Restrictions Per Provider Order: No       Mobility Bed Mobility Overal bed mobility: Needs Assistance Bed Mobility: Rolling, Sidelying  to Sit, Sit to Sidelying Rolling: Min assist Sidelying to sit: Min assist     Sit to sidelying: Min assist General bed mobility comments: assist for BLE    Transfers Overall transfer level: Needs assistance Equipment used: Rolling walker (2 wheels) Transfers: Sit to/from Stand Sit to Stand: Min assist                 Balance Overall balance assessment: Needs assistance Sitting-balance support: Feet supported Sitting balance-Leahy Scale: Fair     Standing balance support: Bilateral upper extremity supported, Reliant on assistive device for balance, During functional activity Standing balance-Leahy Scale: Poor Standing balance comment: reliant on RW support                           ADL either performed or assessed with clinical judgement   ADL Overall ADL's : Needs assistance/impaired                 Upper Body Dressing : Moderate assistance Upper Body Dressing Details (indicate cue type and reason): to pull shirt down, max A to don brace Lower Body Dressing: Moderate assistance Lower Body Dressing Details (indicate cue type and reason): typically has assist from spouse at baseline, discussed use of AE for LB ADLs Toilet Transfer: Minimal assistance;Rolling walker (2 wheels)           Functional mobility during ADLs: Minimal assistance;Rolling walker (2 wheels)      Extremity/Trunk Assessment Upper Extremity Assessment Upper Extremity Assessment: Generalized weakness   Lower Extremity Assessment Lower Extremity Assessment: Defer to PT evaluation        Vision   Vision Assessment?: No apparent visual deficits   Perception Perception Perception: Not tested   Praxis Praxis Praxis: Not  tested   Communication Communication Communication: Impaired Factors Affecting Communication: Hearing impaired   Cognition Arousal: Alert Behavior During Therapy: WFL for tasks assessed/performed Cognition: No apparent impairments                                Following commands: Intact        Cueing   Cueing Techniques: Verbal cues, Gestural cues, Tactile cues  Exercises      Shoulder Instructions       General Comments VSS    Pertinent Vitals/ Pain       Pain Assessment Pain Assessment: Faces Pain Score: 8  Faces Pain Scale: Hurts whole lot Pain Location: back Pain Descriptors / Indicators: Operative site guarding, Sore Pain Intervention(s): Limited activity within patient's tolerance, Monitored during session, Repositioned, Premedicated before session  Home Living                                          Prior Functioning/Environment              Frequency  Min 2X/week        Progress Toward Goals  OT Goals(current goals can now be found in the care plan section)  Progress towards OT goals: Progressing toward goals  Acute Rehab OT Goals Patient Stated Goal: none stated OT Goal Formulation: With patient Time For Goal Achievement: 01/20/24 Potential to Achieve Goals: Good ADL Goals Pt Will Perform Upper Body Dressing: with contact guard assist;sitting Pt Will Perform Lower Body Dressing: sitting/lateral leans;sit to/from stand;with adaptive equipment Pt Will Transfer to Toilet: with contact guard assist;ambulating;regular height toilet Pt Will Perform Tub/Shower Transfer: Shower transfer;with contact guard assist;ambulating;shower seat;rolling walker Additional ADL Goal #1: pt will complete all ADLs with mod I  Plan      Co-evaluation                 AM-PAC OT 6 Clicks Daily Activity     Outcome Measure   Help from another person eating meals?: None Help from another person taking care of personal grooming?: A Little Help from another person toileting, which includes using toliet, bedpan, or urinal?: A Little Help from another person bathing (including washing, rinsing, drying)?: A Lot Help from another person to put on and taking off regular upper  body clothing?: A Lot Help from another person to put on and taking off regular lower body clothing?: A Lot 6 Click Score: 16    End of Session Equipment Utilized During Treatment: Gait belt;Rolling walker (2 wheels);Back brace  OT Visit Diagnosis: Unsteadiness on feet (R26.81);Other abnormalities of gait and mobility (R26.89);Muscle weakness (generalized) (M62.81)   Activity Tolerance Patient tolerated treatment well   Patient Left in bed;with call bell/phone within reach   Nurse Communication Mobility status        Time: 9244-9168 OT Time Calculation (min): 36 min  Charges: OT General Charges $OT Visit: 1 Visit OT Treatments $Self Care/Home Management : 8-22 mins $Therapeutic Activity: 8-22 mins  Aaron Richmond, OTD, OTR/L SecureChat Preferred Acute Rehab [phone]   Aaron Richmond 01/07/2024, 9:27 AM

## 2024-01-07 NOTE — Progress Notes (Signed)
 Physical Therapy Treatment  Patient Details Name: Aaron Richmond MRN: [id number] DOB: 06-Sep-1945 Today's Date: 01/07/2024   History of Present Illness 78 y/o M s/p  extension of posteriro lateral fusion with instrumentation T4-5, T5-6, T6-7  PMH includes AAA, A fib RVR, CHF, CKD, HLD, HTN, sacral wound, R TKA, R THA, posteriolateral laminectomy (11/2023)    PT Comments  Pt progressing slowly towards physical therapy goals. Likely near baseline of function based on report from pt and wife. Wife reports once he is down into the sunken den he will have access to the rest of the house. Stair training completed this session. Pt with near fall when attempting to turn and sit in transport chair to get to stairs. Feet getting tangled with one another due to R foot drop. Pt initially reporting that if he wears his tennis shoes he does not need the AFO. Recommend pt use AFO at home and discussed with wife recommendation to get the AFO on pt before he even gets out of the car at home to decrease risk for falls. Gait belt issued for home use as well. Will continue to follow.    If plan is discharge home, recommend the following: A little help with walking and/or transfers;A little help with bathing/dressing/bathroom;Assistance with cooking/housework;Assist for transportation;Help with stairs or ramp for entrance   Can travel by private vehicle        Equipment Recommendations  None recommended by PT    Recommendations for Other Services       Precautions / Restrictions Precautions Precautions: Fall;Back Precaution Booklet Issued: Yes (comment) Recall of Precautions/Restrictions: Impaired Precaution/Restrictions Comments: Pt able to recall prec however doesn't adhere to them functionally. Reviewed precautions throughout functional mobility. Required Braces or Orthoses: Spinal Brace Spinal Brace: Other (comment) Spinal Brace Comments: CTO brace;  per PA 12/30 can take it off when he is lying in  the bed but needs it on if he is sitting up in the bed and OOB Restrictions Weight Bearing Restrictions Per Provider Order: No     Mobility  Bed Mobility Overal bed mobility: Needs Assistance Bed Mobility: Rolling, Sidelying to Sit Rolling: Supervision Sidelying to sit: Supervision       General bed mobility comments: Pt wanting to complete on his own. Increased time and effort required, but pt able to transition to EOB with heavy use of rails for support.    Transfers Overall transfer level: Needs assistance Equipment used: Rolling walker (2 wheels) Transfers: Sit to/from Stand Sit to Stand: Mod assist, +2 safety/equipment           General transfer comment: Up to mod assist for power up to full stand. Wife assisting at times as well as PT. VC's for hand placement on seated surface for safety.    Ambulation/Gait Ambulation/Gait assistance: Mod assist, +2 safety/equipment, Max assist Gait Distance (Feet): 15 Feet Assistive device: Rolling walker (2 wheels) Gait Pattern/deviations: Step-through pattern, Decreased stride length, Decreased dorsiflexion - right Gait velocity: Decreased Gait velocity interpretation: <1.31 ft/sec, indicative of household ambulator   General Gait Details: Pt ambulated ~15' out to chair set up in hall. Pt without AFO brace, however tennis shoes donned. Pt reports if he has tennis shoes on he does not require the AFO. Noted R foot getting tangled in L and pt with increased difficulty advancing the R. Pt with anterior lean and began stating he was going down. Max assist required to reposition upright and to facilitate safe stand>sit.   Stairs Stairs: Yes  Stairs assistance: Mod assist Stair Management: One rail Right, Sideways, Step to pattern Number of Stairs: 1 (x2) General stair comments: VC's for sequencing and general safety.   Wheelchair Mobility     Tilt Bed    Modified Rankin (Stroke Patients Only)       Balance Overall  balance assessment: Needs assistance Sitting-balance support: Feet supported Sitting balance-Leahy Scale: Fair     Standing balance support: Bilateral upper extremity supported, Reliant on assistive device for balance, During functional activity Standing balance-Leahy Scale: Poor Standing balance comment: reliant on RW support                            Communication Communication Communication: Impaired Factors Affecting Communication: Hearing impaired  Cognition Arousal: Alert Behavior During Therapy: WFL for tasks assessed/performed   PT - Cognitive impairments: No family/caregiver present to determine baseline                       PT - Cognition Comments: pt easily distracted but can be re-directed, tangential with speech Following commands: Intact      Cueing Cueing Techniques: Verbal cues, Gestural cues, Tactile cues  Exercises      General Comments General comments (skin integrity, edema, etc.): Brace adjusted for optimal fit.      Pertinent Vitals/Pain Pain Assessment Pain Assessment: Faces Faces Pain Scale: Hurts whole lot Pain Location: back Pain Descriptors / Indicators: Operative site guarding, Sore Pain Intervention(s): Limited activity within patient's tolerance, Monitored during session, Repositioned    Home Living                          Prior Function            PT Goals (current goals can now be found in the care plan section) Acute Rehab PT Goals Patient Stated Goal: home yesterday PT Goal Formulation: With patient Time For Goal Achievement: 01/20/24 Potential to Achieve Goals: Good Progress towards PT goals: Progressing toward goals    Frequency    Min 5X/week      PT Plan      Co-evaluation              AM-PAC PT 6 Clicks Mobility   Outcome Measure  Help needed turning from your back to your side while in a flat bed without using bedrails?: A Little Help needed moving from lying on your  back to sitting on the side of a flat bed without using bedrails?: A Little Help needed moving to and from a bed to a chair (including a wheelchair)?: A Lot Help needed standing up from a chair using your arms (e.g., wheelchair or bedside chair)?: A Lot Help needed to walk in hospital room?: A Little Help needed climbing 3-5 steps with a railing? : A Little 6 Click Score: 16    End of Session Equipment Utilized During Treatment: Gait belt;Back brace Activity Tolerance: Patient limited by fatigue;Patient limited by pain Patient left: in chair;with call bell/phone within reach;with family/visitor present Nurse Communication: Mobility status (Pt requesting to use the urinal prior to d/c. Wife in room, asking for staff assist. NT notified.) PT Visit Diagnosis: Unsteadiness on feet (R26.81);Muscle weakness (generalized) (M62.81)     Time: 8972-8882 PT Time Calculation (min) (ACUTE ONLY): 50 min  Charges:    $Gait Training: 38-52 mins PT General Charges $$ ACUTE PT VISIT: 1 Visit  Leita Sable, PT, DPT Acute Rehabilitation Services Secure Chat Preferred Office: [phone]    Leita JONETTA Sable 01/07/2024, 1:28 PM

## 2024-01-07 NOTE — Discharge Summary (Signed)
 Physician Discharge Summary  Patient ID: Aaron Richmond MRN: [id number] DOB/AGE: 77-May-1947 77 y.o. Estimated body mass index is 24.22 kg/m as calculated from the following:   Height as of this encounter: 5' 10 (1.778 m).   Weight as of this encounter: 76.6 kg.   Admit date: 01/05/2024 Discharge date: 01/07/2024  Admission Diagnoses: Spinal instability  Discharge Diagnoses: Same Principal Problem:   Pseudoarthrosis of lumbar spine   Discharged Condition: good  Hospital Course: Patient was admitted to hospital underwent extension of his fusion with additional pedicle screws tying into his pre-existing construct.  Postoperative patient did very well and coming on for the floor was ambulating voiding spontaneously tolerating regular diet stable for discharge home is cleared by physical therapy.  Patient will be discharged schedule follow-up in 2 weeks.  Consults: Significant Diagnostic Studies: Treatments: Thoracic stabilization T4-T7 Discharge Exam: Blood pressure (!) 111/51, pulse 60, temperature 97.8 F (36.6 C), temperature source Oral, resp. rate 18, height 5' 10 (1.778 m), weight 76.6 kg, SpO2 93%. Strength 5 out of 5 except for baseline right foot drop incision clean dry and intact  Disposition: Home  Discharge Instructions     Face-to-face encounter (required for Medicare/Medicaid patients)   Complete by: As directed    I Arley SHAUNNA Helling certify that this patient is under my care and that I, or a nurse practitioner or physician's assistant working with me, had a face-to-face encounter that meets the physician face-to-face encounter requirements with this patient on 01/07/2024. The encounter with the patient was in whole, or in part for the following medical condition(s) which is the primary reason for home health care (List medical condition): Spinal instability and lumbar stenosis   The encounter with the patient was in whole, or in part, for the following medical  condition, which is the primary reason for home health care: Spinal instability lumbar stenosis   I certify that, based on my findings, the following services are medically necessary home health services: Physical therapy   Reason for Medically Necessary Home Health Services: Therapy- Investment Banker, Operational, Patent Examiner   My clinical findings support the need for the above services: Unable to leave home safely without assistance and/or assistive device   Further, I certify that my clinical findings support that this patient is homebound due to: Unable to leave home safely without assistance   Home Health   Complete by: As directed    To provide the following care/treatments:  PT OT        Allergies as of 01/07/2024       Reactions   Cipro [ciprofloxacin Hcl] Other (See Comments)   Hx of ascending thoracic aortic aneurysm, increased risk of rupture   Other Other (See Comments)   fluoroquinolone antibiotics - Hx of ascending thoracic aortic aneurysm, increased risk of rupture        Medication List     TAKE these medications    acetaminophen  500 MG tablet Commonly known as: TYLENOL  Take 1,000 mg by mouth 2 (two) times daily.   amiodarone  200 MG tablet Commonly known as: PACERONE  Take 1 tablet (200 mg total) by mouth daily.   B-12 5000 MCG Tbdp Take 5,000 mcg by mouth daily.   chlorhexidine  4 % external liquid Commonly known as: HIBICLENS  Apply 15 mLs (1 Application total) topically as directed for 30 doses. Use as directed daily for 5 days every other week for 6 weeks.   dapagliflozin  propanediol 10 MG Tabs tablet Commonly known as: FARXIGA   Take 1 tablet (10 mg total) by mouth daily.   methocarbamol  500 MG tablet Commonly known as: ROBAXIN  Take 1 tablet (500 mg total) by mouth 4 (four) times daily. What changed:  when to take this reasons to take this   metoprolol  succinate 25 MG 24 hr tablet Commonly known as: TOPROL -XL Take 0.5 tablets (12.5 mg  total) by mouth 2 (two) times daily.   Multivitamin Men 50+ Tabs Take 1 tablet by mouth daily.   mupirocin  ointment 2 % Commonly known as: BACTROBAN  Place 1 Application into the nose 2 (two) times daily for 60 doses. Use as directed 2 times daily for 5 days every other week for 6 weeks.   Oxycodone  HCl 10 MG Tabs Take 0.5 tablets (5 mg total) by mouth every 6 (six) hours as needed for severe pain (pain score 7-10).   polyethylene glycol powder 17 GM/SCOOP powder Commonly known as: GLYCOLAX /MIRALAX  Take 17 g by mouth daily as needed (constipation).   potassium chloride  SA 20 MEQ tablet Commonly known as: Klor-Con  M20 Take 1 tablet (20 mEq total) by mouth daily.   Rivaroxaban  15 MG Tabs tablet Commonly known as: XARELTO  Take 15 mg by mouth every evening.   rosuvastatin  10 MG tablet Commonly known as: CRESTOR  Take 1 tablet (10 mg total) by mouth daily. What changed: when to take this   spironolactone  25 MG tablet Commonly known as: ALDACTONE  Take 1 tablet (25 mg total) by mouth daily.   tamsulosin  0.4 MG Caps capsule Commonly known as: FLOMAX  TAKE 1 CAPSULE BY MOUTH TWICE DAILY 30  MINUTES  FOLLOWING  THE  SAME  MEAL  EACH  DAY What changed: See the new instructions.   torsemide  10 MG tablet Commonly known as: DEMADEX  Take 1 tablet (10 mg total) by mouth 2 (two) times daily. What changed:  when to take this additional instructions   traMADol  50 MG tablet Commonly known as: ULTRAM  Take 2 tablets (100 mg total) by mouth every 8 (eight) hours as needed for moderate pain (pain score 4-6).         Signed: Arley SHAUNNA Helling 01/07/2024, 8:25 AM

## 2024-01-07 NOTE — TOC Transition Note (Signed)
 Transition of Care [facility]) - Discharge Note   Patient Details  Name: JAELIN FACKLER MRN: [id number] Date of Birth: 06/10/1945  [facility] CM/SW Contact:  Roxie KANDICE Stain, RN Phone Number: 01/07/2024, 9:20 AM   Clinical Narrative:    Carlin FORBES Running is stable to discharge home. Follow up apt on AVS. Patient to follow up with OP rehab. No ICM (Inpatient Care Management) needs at this time.    Final next level of care: OP Rehab Barriers to Discharge: Barriers Resolved   Patient Goals and CMS Choice Patient states their goals for this hospitalization and ongoing recovery are:: return home          Discharge Placement                 Home      Discharge Plan and Services Additional resources added to the After Visit Summary for                                       Social Drivers of Health (SDOH) Interventions SDOH Screenings   Food Insecurity: No Food Insecurity (09/03/2023)  Housing: Unknown (09/03/2023)  Transportation Needs: No Transportation Needs (09/03/2023)  Utilities: Not At Risk (09/03/2023)  Depression (PHQ2-9): Low Risk (03/10/2023)  Social Connections: Socially Integrated (09/03/2023)  Tobacco Use: Low Risk (01/05/2024)     Readmission Risk Interventions     No data to display

## 2024-01-07 NOTE — Progress Notes (Signed)
 Patient alert and oriented, mae's well, voiding adequate amount of urine, swallowing without difficulty, no c/o pain at time of discharge. Patient discharged home with family. Script and discharged instructions given to patient. Patient and family stated understanding of instructions given. Patient has an appointment with Dr. Onetha In 2 weeks. Patient waiting for family for ride home

## 2024-01-13 ENCOUNTER — Other Ambulatory Visit: Payer: Self-pay

## 2024-01-13 MED ORDER — TORSEMIDE 10 MG PO TABS: 10.0000 mg | ORAL_TABLET | ORAL | 1 refills | Status: AC

## 2024-01-14 ENCOUNTER — Other Ambulatory Visit: Payer: Self-pay | Admitting: Thoracic Surgery (Cardiothoracic Vascular Surgery)

## 2024-01-14 DIAGNOSIS — I351 Nonrheumatic aortic (valve) insufficiency: Secondary | ICD-10-CM

## 2024-01-14 DIAGNOSIS — I7121 Aneurysm of the ascending aorta, without rupture: Secondary | ICD-10-CM

## 2024-01-23 ENCOUNTER — Encounter (HOSPITAL_COMMUNITY): Payer: Self-pay | Admitting: Neurosurgery

## 2024-01-28 ENCOUNTER — Ambulatory Visit (HOSPITAL_COMMUNITY)
Admission: RE | Admit: 2024-01-28 | Discharge: 2024-01-28 | Disposition: A | Discharge status: Home / Self Care | Source: Ambulatory Visit | Attending: Physician Assistant | Admitting: Physician Assistant

## 2024-01-28 ENCOUNTER — Other Ambulatory Visit (HOSPITAL_COMMUNITY): Payer: Self-pay | Admitting: *Deleted

## 2024-01-28 ENCOUNTER — Ambulatory Visit (HOSPITAL_COMMUNITY)
Admission: RE | Admit: 2024-01-28 | Discharge: 2024-01-28 | Disposition: A | Source: Ambulatory Visit | Attending: Cardiology | Admitting: Cardiology

## 2024-01-28 ENCOUNTER — Ambulatory Visit (HOSPITAL_COMMUNITY): Payer: Self-pay | Admitting: Cardiology

## 2024-01-28 ENCOUNTER — Ambulatory Visit (HOSPITAL_COMMUNITY)
Admission: RE | Admit: 2024-01-28 | Discharge: 2024-01-28 | Disposition: A | Source: Ambulatory Visit | Attending: Thoracic Surgery (Cardiothoracic Vascular Surgery) | Admitting: Thoracic Surgery (Cardiothoracic Vascular Surgery)

## 2024-01-28 VITALS — BP 142/52 | HR 63 | Wt 156.0 lb

## 2024-01-28 DIAGNOSIS — I083 Combined rheumatic disorders of mitral, aortic and tricuspid valves: Secondary | ICD-10-CM | POA: Insufficient documentation

## 2024-01-28 DIAGNOSIS — I77810 Thoracic aortic ectasia: Secondary | ICD-10-CM | POA: Insufficient documentation

## 2024-01-28 DIAGNOSIS — I7121 Aneurysm of the ascending aorta, without rupture: Secondary | ICD-10-CM | POA: Insufficient documentation

## 2024-01-28 DIAGNOSIS — Z7901 Long term (current) use of anticoagulants: Secondary | ICD-10-CM | POA: Diagnosis not present

## 2024-01-28 DIAGNOSIS — Z85830 Personal history of malignant neoplasm of bone: Secondary | ICD-10-CM | POA: Diagnosis not present

## 2024-01-28 DIAGNOSIS — Z79899 Other long term (current) drug therapy: Secondary | ICD-10-CM | POA: Diagnosis not present

## 2024-01-28 DIAGNOSIS — I428 Other cardiomyopathies: Secondary | ICD-10-CM | POA: Insufficient documentation

## 2024-01-28 DIAGNOSIS — Z86711 Personal history of pulmonary embolism: Secondary | ICD-10-CM | POA: Diagnosis not present

## 2024-01-28 DIAGNOSIS — Z8546 Personal history of malignant neoplasm of prostate: Secondary | ICD-10-CM | POA: Insufficient documentation

## 2024-01-28 DIAGNOSIS — I5022 Chronic systolic (congestive) heart failure: Secondary | ICD-10-CM | POA: Insufficient documentation

## 2024-01-28 DIAGNOSIS — Z981 Arthrodesis status: Secondary | ICD-10-CM | POA: Diagnosis not present

## 2024-01-28 DIAGNOSIS — I4891 Unspecified atrial fibrillation: Secondary | ICD-10-CM | POA: Diagnosis not present

## 2024-01-28 DIAGNOSIS — M21371 Foot drop, right foot: Secondary | ICD-10-CM | POA: Diagnosis not present

## 2024-01-28 DIAGNOSIS — R0602 Shortness of breath: Secondary | ICD-10-CM | POA: Insufficient documentation

## 2024-01-28 DIAGNOSIS — G4733 Obstructive sleep apnea (adult) (pediatric): Secondary | ICD-10-CM | POA: Insufficient documentation

## 2024-01-28 DIAGNOSIS — I2510 Atherosclerotic heart disease of native coronary artery without angina pectoris: Secondary | ICD-10-CM | POA: Diagnosis not present

## 2024-01-28 DIAGNOSIS — I471 Supraventricular tachycardia, unspecified: Secondary | ICD-10-CM | POA: Insufficient documentation

## 2024-01-28 DIAGNOSIS — I351 Nonrheumatic aortic (valve) insufficiency: Secondary | ICD-10-CM

## 2024-01-28 LAB — ECHOCARDIOGRAM COMPLETE
AR max vel: 2.24 cm2
AV Area VTI: 2.19 cm2
AV Area mean vel: 2.19 cm2
AV Mean grad: 8 mmHg
AV Peak grad: 16 mmHg
Ao pk vel: 2 m/s
Area-P 1/2: 4.99 cm2
Calc EF: 43.6 %
MV M vel: 5.51 m/s
MV Peak grad: 121.4 mmHg
P 1/2 time: 139 ms
Radius: 0.7 cm
S' Lateral: 5.1 cm
Single Plane A2C EF: 46.7 %
Single Plane A4C EF: 40.6 %

## 2024-01-28 LAB — CBC
HCT: 35.1 % — ABNORMAL LOW (ref 39.0–52.0)
Hemoglobin: 10.7 g/dL — ABNORMAL LOW (ref 13.0–17.0)
MCH: 27 pg (ref 26.0–34.0)
MCHC: 30.5 g/dL (ref 30.0–36.0)
MCV: 88.6 fL (ref 80.0–100.0)
Platelets: 426 K/uL — ABNORMAL HIGH (ref 150–400)
RBC: 3.96 MIL/uL — ABNORMAL LOW (ref 4.22–5.81)
RDW: 15.1 % (ref 11.5–15.5)
WBC: 9 K/uL (ref 4.0–10.5)
nRBC: 0 % (ref 0.0–0.2)

## 2024-01-28 LAB — COMPREHENSIVE METABOLIC PANEL WITH GFR
ALT: 15 U/L (ref 0–44)
AST: 25 U/L (ref 15–41)
Albumin: 4.1 g/dL (ref 3.5–5.0)
Alkaline Phosphatase: 187 U/L — ABNORMAL HIGH (ref 38–126)
Anion gap: 11 (ref 5–15)
BUN: 25 mg/dL — ABNORMAL HIGH (ref 8–23)
CO2: 27 mmol/L (ref 22–32)
Calcium: 9.8 mg/dL (ref 8.9–10.3)
Chloride: 96 mmol/L — ABNORMAL LOW (ref 98–111)
Creatinine, Ser: 1.62 mg/dL — ABNORMAL HIGH (ref 0.61–1.24)
GFR, Estimated: 43 mL/min — ABNORMAL LOW
Glucose, Bld: 122 mg/dL — ABNORMAL HIGH (ref 70–99)
Potassium: 5.8 mmol/L — ABNORMAL HIGH (ref 3.5–5.1)
Sodium: 134 mmol/L — ABNORMAL LOW (ref 135–145)
Total Bilirubin: 0.4 mg/dL (ref 0.0–1.2)
Total Protein: 7.7 g/dL (ref 6.5–8.1)

## 2024-01-28 LAB — TSH: TSH: 2.32 u[IU]/mL (ref 0.350–4.500)

## 2024-01-28 LAB — PRO BRAIN NATRIURETIC PEPTIDE: Pro Brain Natriuretic Peptide: 1563 pg/mL — ABNORMAL HIGH

## 2024-01-28 MED ORDER — IOHEXOL 350 MG/ML SOLN
100.0000 mL | Freq: Once | INTRAVENOUS | Status: AC | PRN
  Administered 2024-01-28: 100 mL via INTRAVENOUS

## 2024-01-28 MED ORDER — SPIRONOLACTONE 25 MG PO TABS: 12.5000 mg | ORAL_TABLET | Freq: Every day | ORAL | Status: AC

## 2024-01-28 MED ORDER — VALSARTAN 40 MG PO TABS: 20.0000 mg | ORAL_TABLET | Freq: Two times a day (BID) | ORAL | 6 refills | Status: DC

## 2024-01-28 NOTE — Patient Instructions (Signed)
 Medication Changes:  START Valsartan  20 mg (1/2 tab) Twice daily   Lab Work:  Labs done today, your results will be available in MyChart, we will contact you for abnormal readings.  Your physician recommends that you return for lab work in: 1-2 weeks, we have provided you an order to have this done in [place]  Special Instructions // Education:  Do the following things EVERYDAY: Weigh yourself in the morning before breakfast. Write it down and keep it in a log. Take your medicines as prescribed Eat low salt foods--Limit salt (sodium) to 2000 mg per day.  Stay as active as you can everyday Limit all fluids for the day to less than 2 liters   Follow-Up in: 1 month   At the [facility], you and your health needs are our priority. We have a designated team specialized in the treatment of Heart Failure. This Care Team includes your primary Heart Failure Specialized Cardiologist (physician), Advanced Practice Providers (APPs- Physician Assistants and Nurse Practitioners), and Pharmacist who all work together to provide you with the care you need, when you need it.   You may see any of the following providers on your designated Care Team at your next follow up:  Dr. Toribio Fuel Dr. Ezra Shuck Dr. Odis Brownie Greig Mosses, NP Caffie Shed, [place] [facility], [place] Beckey Coe, NP Jordan Lee, NP Tinnie Redman, PharmD   Please be sure to bring in all your medications bottles to every appointment.   Need to Contact Us :  If you have any questions or concerns before your next appointment please send us  a message through [place] or call our office at [phone].    TO LEAVE A MESSAGE FOR THE NURSE SELECT OPTION 2, PLEASE LEAVE A MESSAGE INCLUDING: YOUR NAME DATE OF BIRTH CALL BACK NUMBER REASON FOR CALL**this is important as we prioritize the call backs  YOU WILL RECEIVE A CALL BACK THE SAME DAY AS LONG AS YOU CALL BEFORE 4:00 PM

## 2024-01-29 NOTE — Progress Notes (Signed)
 "  ADVANCED HF CLINIC NOTE   Primary Care: Fleeta Valeria Mayo, MD Primary Cardiologist: Dr. Monetta HF Cardiologist: Dr. Rolan  Chief complaint: CHF  HPI: 79 y.o. male with history of atrial fibrillation, HFrEF, hx thoracic aortic aneurysm followed by TCTS, and nonobstructive CAD (coronary CTA with nonobstructive disease in LAD and diagonal branches, calcium  score 1959). Other history includes severe central and obstructive sleep apnea on BiPAP, remote hx PE, prostate cancer w/ metastases to bone.  He follows with Dr. Monetta in [place].   EF was 50-55% by echo in 10/23. EF slightly lower at 40-45% on echo at [facility] 6/24. Echo 6/25 with EF 35-40%, RV okay, severe LAE, mild to moderate MR, moderate AI, ascending aorta measures 56 mm. Coronary CTA in 7/25 showed calcium  score 1959 with moderate CAD but not hemodynamically significant by CT FFR.    He presented 08/14/23 for bilateral transpedicular decompression of L1 for L1 burst fracture with cord compression. Post op had unstable SVT and required pressor support. Cardiology evaluated patient and he failed cardioversion X 2. EP then called. He failed adenosine 6 mg and 12 mg and another cardioversion. Started on IV amiodarone  and converted to AF with RVR after 360 J shock with [place]. He was transferred to the ICU on pressor support and IV amiodarone . He ultimately converted to NSR on amiodarone .  He had no recurrent rhythm issues and was later switched to po amiodarone .  Had difficulty titrating NE due to low BP. Advanced Heart Failure asked to see to assist with management. He was diuresed and GDMT titrated. He was discharged to CIR, weight 167 lbs.  Cardiac MRI in 9/25 showed LV EF 40%, RV EF 37%, severe AI with regurgitant fraction 50% and regurgitant volume 59 cc; there was subendocardial LGE in the basal inferolateral wall suggestive of prior infarct. Patient saw Dr. Kerrin in 10/25 for evaluation for AVR + ascending aorta aneurysm repair,  but he was in a wheelchair with severe back pain, deemed not candidate for cardiac surgery at that point.   In 12/25, he had an additional spinal surgery.   Echo was done today and reviewed, EF 35-40%, mild LV dilation, mild LVH, RV normal, moderate MR, trileaflet aortic valve with no stenosis and severe regurgitation, 5.2 cm ascending aorta.  CTA chest today showed 5.5 cm ascending aortic aneurysm.   Patient returns for followup of CHF.  He is short of breath walking short distances with his walker.  Mainly limited, however, by ongoing back pain.  No chest pain.  Occasional lightheadedness when he first sits up in the morning.  No palpitation, he is in NSR today.  He is not very active due to back pain, goes from bed to recliner.  Has foot drop on the right. He will be seeing Dr. Onetha again in about 2 more weeks to decide on need for additional surgery.   ECG (personally reviewed): NSR, LVH  Labs (12/25): hgb 10.9, K 4.2, creatinine 1.24 Labs (1/26): K 5.8, creatinine 1.62, AST/ALT normal, TSH normal, hgb 10.7, pro-BNP 1563  PMH: 1. Ascending aorta/aortic root aneurysm: 5.5 cm on CTA chest 1/26.  Associated with severe aortic insufficiency.  2. Atrial fibrillation: Thought to be permanent but now maintaining NSR on amiodarone .  3. Prostate cancer: metastatic to bone.  4. Lumbar radiculopathy: Multiple operations.  5. SVT 6. CAD: Coronary CTA in 7/25 showed calcium  score 1959 with moderate CAD but not hemodynamically significant by CT FFR.  7. OSA: On Bipap.  8. Aortic  insufficiency: Severe AI in setting of dilated ascending aorta and root.  Aortic valve is trileaflet.  9. Chronic systolic CHF: Nonischemic cardiomyopathy, possible valvular cardiomyopathy with severe AI.  - Echo (6/25): EF 35-40%, RV ok, mild to moderate MR, moderate AI - Coronary CTA in 7/25 showed calcium  score 1959 with moderate CAD but not hemodynamically significant by CT FFR.  - Echo (8/25): EF 30-35%, RV mildly  reduced, moderate AI, ascending aorta 56 mm - Cardiac MRI in 9/25 showed LV EF 40%, RV EF 37%, severe AI with regurgitant fraction 50% and regurgitant volume 59 cc; there was subendocardial LGE in the basal inferolateral wall suggestive of prior infarct.  Cardiac Studies - Echo (1/26): EF 35-40%, mild LV dilation, mild LVH, RV normal, moderate MR, trileaflet aortic valve with no stenosis and severe regurgitation, 5.2 cm ascending aorta.  10. H/o PE  ROS: All systems reviewed and negative except as per HPI.   Current Outpatient Medications  Medication Sig Dispense Refill   acetaminophen  (TYLENOL ) 500 MG tablet Take 1,000 mg by mouth 2 (two) times daily.     amiodarone  (PACERONE ) 200 MG tablet Take 1 tablet (200 mg total) by mouth daily. 90 tablet 3   chlorhexidine  (HIBICLENS ) 4 % external liquid Apply 15 mLs (1 Application total) topically as directed for 30 doses. Use as directed daily for 5 days every other week for 6 weeks. 946 mL 1   dapagliflozin  propanediol (FARXIGA ) 10 MG TABS tablet Take 1 tablet (10 mg total) by mouth daily. 30 tablet 5   methocarbamol  (ROBAXIN ) 500 MG tablet Take 1 tablet (500 mg total) by mouth 4 (four) times daily. 120 tablet 0   Methylcobalamin (B-12) 5000 MCG TBDP Take 5,000 mcg by mouth daily.     metoprolol  succinate (TOPROL -XL) 25 MG 24 hr tablet Take 0.5 tablets (12.5 mg total) by mouth 2 (two) times daily. 60 tablet 5   Multiple Vitamins-Minerals (MULTIVITAMIN MEN 50+) TABS Take 1 tablet by mouth daily.     mupirocin  ointment (BACTROBAN ) 2 % Place 1 Application into the nose 2 (two) times daily for 60 doses. Use as directed 2 times daily for 5 days every other week for 6 weeks. 60 g 0   oxyCODONE  10 MG TABS Take 0.5 tablets (5 mg total) by mouth every 6 (six) hours as needed for severe pain (pain score 7-10). 30 tablet 0   polyethylene glycol powder (GLYCOLAX /MIRALAX ) 17 GM/SCOOP powder Take 17 g by mouth daily as needed (constipation).     Rivaroxaban   (XARELTO ) 15 MG TABS tablet Take 15 mg by mouth every evening.     rosuvastatin  (CRESTOR ) 10 MG tablet Take 1 tablet (10 mg total) by mouth daily. 30 tablet 5   tamsulosin  (FLOMAX ) 0.4 MG CAPS capsule TAKE 1 CAPSULE BY MOUTH TWICE DAILY 30  MINUTES  FOLLOWING  THE  SAME  MEAL  EACH  DAY 180 capsule 0   torsemide  (DEMADEX ) 10 MG tablet Take 1 tablet (10 mg total) by mouth See admin instructions. Take 1 tablet (10mg ) by mouth twice daily - in the morning and early afternoon (around 1300). 60 tablet 1   traMADol  (ULTRAM ) 50 MG tablet Take 2 tablets (100 mg total) by mouth every 8 (eight) hours as needed for moderate pain (pain score 4-6). 180 tablet 0   spironolactone  (ALDACTONE ) 25 MG tablet Take 0.5 tablets (12.5 mg total) by mouth daily.     No current facility-administered medications for this encounter.   Allergies  Allergen Reactions  Cipro [Ciprofloxacin Hcl] Other (See Comments)    Hx of ascending thoracic aortic aneurysm, increased risk of rupture   Other Other (See Comments)    fluoroquinolone antibiotics - Hx of ascending thoracic aortic aneurysm, increased risk of rupture   Social History   Socioeconomic History   Marital status: Married    Spouse name: Not on file   Number of children: 0   Years of education: Not on file   Highest education level: Not on file  Occupational History   Occupation: Chief operating officer  Tobacco Use   Smoking status: Never   Smokeless tobacco: Never  Vaping Use   Vaping status: Never Used  Substance and Sexual Activity   Alcohol  use: No   Drug use: No   Sexual activity: Yes  Other Topics Concern   Not on file  Social History Narrative   Not on file   Social Drivers of Health   Tobacco Use: Low Risk (01/05/2024)   Patient History    Smoking Tobacco Use: Never    Smokeless Tobacco Use: Never    Passive Exposure: Not on file  Financial Resource Strain: Not on file  Food Insecurity: No Food Insecurity (09/03/2023)   Epic    Worried  About Programme Researcher, Broadcasting/film/video in the Last Year: Never true    Ran Out of Food in the Last Year: Never true  Transportation Needs: No Transportation Needs (09/03/2023)   Epic    Lack of Transportation (Medical): No    Lack of Transportation (Non-Medical): No  Physical Activity: Not on file  Stress: Not on file  Social Connections: Socially Integrated (09/03/2023)   Social Connection and Isolation Panel    Frequency of Communication with Friends and Family: More than three times a week    Frequency of Social Gatherings with Friends and Family: Once a week    Attends Religious Services: More than 4 times per year    Active Member of [employer] or Organizations: Yes    Attends Banker Meetings: 1 to 4 times per year    Marital Status: Married  Catering Manager Violence: Unknown (09/03/2023)   Epic    Fear of Current or Ex-Partner: No    Emotionally Abused: No    Physically Abused: Not on file    Sexually Abused: No  Depression (PHQ2-9): Low Risk (03/10/2023)   Depression (PHQ2-9)    PHQ-2 Score: 0  Alcohol  Screen: Not on file  Housing: Unknown (09/03/2023)   Epic    Unable to Pay for Housing in the Last Year: No    Number of Times Moved in the Last Year: Not on file    Homeless in the Last Year: No  Utilities: Not At Risk (09/03/2023)   Epic    Threatened with loss of utilities: No  Health Literacy: Not on file   Family History  Problem Relation Age of Onset   Stroke Mother    Alzheimer's disease Mother    Heart attack Mother    Stroke Father    Diabetes Mellitus II Father    Wt Readings from Last 3 Encounters:  01/28/24 70.8 kg (156 lb)  01/05/24 76.6 kg (168 lb 12.8 oz)  12/26/23 75.7 kg (166 lb 12.8 oz)  . BP (!) 142/52   Pulse 63   Wt 70.8 kg (156 lb) Comment: Patient weighed this morning at home.  SpO2 97%   BMI 22.38 kg/m   PHYSICAL EXAM: General: NAD Neck: No JVD, no thyromegaly or thyroid  nodule.  Lungs: Clear to auscultation bilaterally with normal  respiratory effort. CV: Nondisplaced PMI.  Heart regular S1/S2, no S3/S4, 2/6 SEM RUSB, 2/6 diastolic murmur along the sternal border.  No peripheral edema.  No carotid bruit.  Normal pedal pulses.  Abdomen: Soft, nontender, no hepatosplenomegaly, no distention.  Skin: Intact without lesions or rashes.  Neurologic: Alert and oriented x 3.  Psych: Normal affect. Extremities: No clubbing or cyanosis.  HEENT: Normal.   ASSESSMENT & PLAN: 1. Chronic systolic CHF: Nonischemic CMP, suspect valvular cardiomyopathy at least in part with severe AI, patient also was in atrial fibrillation when cardiomyopathy detected.  Echo in 8/25 showed EF 30-35%, mild RV dysfunction, ascending aorta 56 mm with moderate AI. Coronary CTA in 7/25 showed extensive but nonobstructive CAD.  cMRI showed LVEF 40%, RVEF 37%, severe AI, subendocardial LGE in basal inferolateral wall consistent with small prior MI. Echo today was reviewed and showed EF 35-40%, mild LV dilation, mild LVH, RV normal, moderate MR, trileaflet aortic valve with no stenosis and severe regurgitation, 5.2 cm ascending aorta.  NYHA class IIIb symptoms but I think back pain and deconditioning are his primary limitors currently.  He is not volume overloaded on exam.  - Unable to tolerate Entresto  due to orthostasis.  - I had planned to add valsartan  today, but labs came back to show K elevated at 5.8 and creatinine higher. He will stop taking his KCl tablets, he will hold spironolactone  for 2 days then cut dose back to 12.5 mg daily. BMET next week.  - Continue torsemide  20 mg daily. - Continue Toprol  XL 12.5 mg bid. Do not titrate further w/ severe AI. - Continue Farxiga  10 mg daily. 2. Atrial fibrillation: Thought to be permanent but has converted to NSR on amiodarone .  He is in NSR today.  - Continue amiodarone  200 mg daily. LFTs and TSH ok on labs today. He will need  regular eye exam while on amiodarone . - Continue Toprol  XL.   - Continue Xarelto  15 mg  daily. Hgb stable on labs today.  - Would favor Maze if he has surgical AVR and ascending aorta replacement.  3. SVT: Patient has h/o SVT, required DCCV.  Now on amiodarone .  4. CAD: Nonobstructive but extensive CAD on coronary CTA in 7/25. No chest pain - Continue rosuvastatin  10 mg daily. Check lipids next appt.  - No ASA with anticoagulation.  5. Dilated aortic root/ascending aorta with severe AI: CTA chest 1/26 with 5.5 cm ascending aorta.  Echo today showed severe AI in setting of tricuspid aortic valve.  Cardiac MRI in 9/25 also showed severe AI.  Ideally, patient would have surgical AVR with ascending aorta replacement.  However, patient has severe back pain and is minimally mobile at this time. Prior to cardiac surgery, he will need to have back pain controlled and to be more mobile.  - He will have followup with Dr. Kerrin.  6. Lumbar radiculopathy: S/p decompression L1, fusion T10-L4. Had surgery in 8/25 and again in 12/25.  - Has followup with Dr. Onetha.  - Mobility slowly improving.  Follow up: 1 month with APP.   I spent 42 minutes reviewing records, interviewing/examining patient, and managing orders.   Ezra Shuck 01/29/24   "

## 2024-02-03 ENCOUNTER — Ambulatory Visit: Admitting: Thoracic Surgery (Cardiothoracic Vascular Surgery)

## 2024-02-09 DIAGNOSIS — I499 Cardiac arrhythmia, unspecified: Secondary | ICD-10-CM | POA: Insufficient documentation

## 2024-02-09 DIAGNOSIS — R011 Cardiac murmur, unspecified: Secondary | ICD-10-CM | POA: Insufficient documentation

## 2024-02-09 DIAGNOSIS — I509 Heart failure, unspecified: Secondary | ICD-10-CM | POA: Insufficient documentation

## 2024-02-11 ENCOUNTER — Encounter: Payer: Self-pay | Admitting: Cardiology

## 2024-02-11 ENCOUNTER — Ambulatory Visit: Admitting: Cardiology

## 2024-02-11 VITALS — BP 126/46 | HR 80 | Ht 70.0 in | Wt 158.0 lb

## 2024-02-11 DIAGNOSIS — I7121 Aneurysm of the ascending aorta, without rupture: Secondary | ICD-10-CM

## 2024-02-11 DIAGNOSIS — I351 Nonrheumatic aortic (valve) insufficiency: Secondary | ICD-10-CM | POA: Diagnosis not present

## 2024-02-11 DIAGNOSIS — I5022 Chronic systolic (congestive) heart failure: Secondary | ICD-10-CM

## 2024-02-11 DIAGNOSIS — Z79899 Other long term (current) drug therapy: Secondary | ICD-10-CM | POA: Diagnosis not present

## 2024-02-11 DIAGNOSIS — I110 Hypertensive heart disease with heart failure: Secondary | ICD-10-CM | POA: Diagnosis not present

## 2024-02-11 DIAGNOSIS — I2510 Atherosclerotic heart disease of native coronary artery without angina pectoris: Secondary | ICD-10-CM

## 2024-02-11 DIAGNOSIS — I480 Paroxysmal atrial fibrillation: Secondary | ICD-10-CM

## 2024-02-11 DIAGNOSIS — Z7901 Long term (current) use of anticoagulants: Secondary | ICD-10-CM | POA: Diagnosis not present

## 2024-02-11 NOTE — Patient Instructions (Signed)
 Medication Instructions:  Your physician recommends that you continue on your current medications as directed. Please refer to the Current Medication list given to you today.  *If you need a refill on your cardiac medications before your next appointment, please call your pharmacy*  Lab Work: Your physician recommends that you return for lab work in:   Labs today: BMP  If you have labs (blood work) drawn today and your tests are completely normal, you will receive your results only by: MyChart Message (if you have MyChart) OR A paper copy in the mail If you have any lab test that is abnormal or we need to change your treatment, we will call you to review the results.  Testing/Procedures: None  Follow-Up: At [facility], you and your health needs are our priority.  As part of our continuing mission to provide you with exceptional heart care, our providers are all part of one team.  This team includes your primary Cardiologist (physician) and Advanced Practice Providers or APPs (Physician Assistants and Nurse Practitioners) who all work together to provide you with the care you need, when you need it.  Your next appointment:   6 month(s)  Provider:   Redell Leiter, MD    We recommend signing up for the patient portal called MyChart.  Sign up information is provided on this After Visit Summary.  MyChart is used to connect with patients for Virtual Visits (Telemedicine).  Patients are able to view lab/test results, encounter notes, upcoming appointments, etc.  Non-urgent messages can be sent to your provider as well.   To learn more about what you can do with MyChart, go to [email or web address].   Other Instructions None

## 2024-02-12 ENCOUNTER — Ambulatory Visit: Payer: Self-pay | Admitting: Cardiology

## 2024-02-12 LAB — BASIC METABOLIC PANEL WITH GFR
BUN/Creatinine Ratio: 13 (ref 10–24)
BUN: 20 mg/dL (ref 8–27)
CO2: 21 mmol/L (ref 20–29)
Calcium: 9.3 mg/dL (ref 8.6–10.2)
Chloride: 100 mmol/L (ref 96–106)
Creatinine, Ser: 1.51 mg/dL — ABNORMAL HIGH (ref 0.76–1.27)
Glucose: 113 mg/dL — ABNORMAL HIGH (ref 70–99)
Potassium: 4.2 mmol/L (ref 3.5–5.2)
Sodium: 139 mmol/L (ref 134–144)
eGFR: 47 mL/min/{1.73_m2} — ABNORMAL LOW

## 2024-02-27 ENCOUNTER — Ambulatory Visit (HOSPITAL_COMMUNITY)

## 2024-03-09 ENCOUNTER — Ambulatory Visit: Admitting: Thoracic Surgery (Cardiothoracic Vascular Surgery)
